# Patient Record
Sex: Female | Born: 1950
Health system: Southern US, Community
[De-identification: ages and names within clinical notes are randomized; demographics above are authoritative.]

## PROBLEM LIST (undated history)

## (undated) DIAGNOSIS — Z9109 Other allergy status, other than to drugs and biological substances: Secondary | ICD-10-CM

## (undated) DIAGNOSIS — H269 Unspecified cataract: Secondary | ICD-10-CM

## (undated) DIAGNOSIS — K5790 Diverticulosis of intestine, part unspecified, without perforation or abscess without bleeding: Secondary | ICD-10-CM

## (undated) DIAGNOSIS — T4145XA Adverse effect of unspecified anesthetic, initial encounter: Secondary | ICD-10-CM

## (undated) DIAGNOSIS — G709 Myoneural disorder, unspecified: Secondary | ICD-10-CM

## (undated) DIAGNOSIS — T8859XA Other complications of anesthesia, initial encounter: Secondary | ICD-10-CM

## (undated) DIAGNOSIS — Z8619 Personal history of other infectious and parasitic diseases: Secondary | ICD-10-CM

## (undated) DIAGNOSIS — J42 Unspecified chronic bronchitis: Secondary | ICD-10-CM

## (undated) DIAGNOSIS — M1990 Unspecified osteoarthritis, unspecified site: Secondary | ICD-10-CM

## (undated) DIAGNOSIS — T7840XA Allergy, unspecified, initial encounter: Secondary | ICD-10-CM

## (undated) DIAGNOSIS — I341 Nonrheumatic mitral (valve) prolapse: Secondary | ICD-10-CM

## (undated) DIAGNOSIS — E039 Hypothyroidism, unspecified: Secondary | ICD-10-CM

## (undated) DIAGNOSIS — K449 Diaphragmatic hernia without obstruction or gangrene: Secondary | ICD-10-CM

## (undated) DIAGNOSIS — E785 Hyperlipidemia, unspecified: Secondary | ICD-10-CM

## (undated) DIAGNOSIS — N189 Chronic kidney disease, unspecified: Secondary | ICD-10-CM

## (undated) DIAGNOSIS — K589 Irritable bowel syndrome without diarrhea: Secondary | ICD-10-CM

## (undated) DIAGNOSIS — K623 Rectal prolapse: Secondary | ICD-10-CM

## (undated) DIAGNOSIS — J189 Pneumonia, unspecified organism: Secondary | ICD-10-CM

## (undated) DIAGNOSIS — G43909 Migraine, unspecified, not intractable, without status migrainosus: Secondary | ICD-10-CM

## (undated) DIAGNOSIS — J45909 Unspecified asthma, uncomplicated: Secondary | ICD-10-CM

## (undated) DIAGNOSIS — K219 Gastro-esophageal reflux disease without esophagitis: Secondary | ICD-10-CM

## (undated) HISTORY — DX: Personal history of other infectious and parasitic diseases: Z86.19

## (undated) HISTORY — PX: BREAST CYST ASPIRATION: SHX578

## (undated) HISTORY — DX: Nonrheumatic mitral (valve) prolapse: I34.1

## (undated) HISTORY — DX: Unspecified cataract: H26.9

## (undated) HISTORY — DX: Diaphragmatic hernia without obstruction or gangrene: K44.9

## (undated) HISTORY — PX: UPPER GASTROINTESTINAL ENDOSCOPY: SHX188

## (undated) HISTORY — DX: Unspecified chronic bronchitis: J42

## (undated) HISTORY — PX: JOINT REPLACEMENT: SHX530

## (undated) HISTORY — DX: Hyperlipidemia, unspecified: E78.5

## (undated) HISTORY — DX: Diverticulosis of intestine, part unspecified, without perforation or abscess without bleeding: K57.90

## (undated) HISTORY — DX: Rectal prolapse: K62.3

## (undated) HISTORY — DX: Unspecified osteoarthritis, unspecified site: M19.90

## (undated) HISTORY — PX: EYE SURGERY: SHX253

## (undated) HISTORY — DX: Migraine, unspecified, not intractable, without status migrainosus: G43.909

## (undated) HISTORY — DX: Other allergy status, other than to drugs and biological substances: Z91.09

## (undated) HISTORY — DX: Hypothyroidism, unspecified: E03.9

## (undated) HISTORY — DX: Irritable bowel syndrome, unspecified: K58.9

## (undated) HISTORY — PX: OTHER SURGICAL HISTORY: SHX169

## (undated) HISTORY — DX: Gastro-esophageal reflux disease without esophagitis: K21.9

## (undated) HISTORY — PX: COLONOSCOPY: SHX174

## (undated) HISTORY — DX: Allergy, unspecified, initial encounter: T78.40XA

---
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
## 1975-01-25 HISTORY — PX: TUBAL LIGATION: SHX77

## 1997-01-24 HISTORY — PX: ABDOMINAL HYSTERECTOMY: SHX81

## 1997-12-20 ENCOUNTER — Emergency Department (HOSPITAL_COMMUNITY): Admission: EM | Admit: 1997-12-20 | Discharge: 1997-12-20 | Payer: Self-pay | Admitting: Emergency Medicine

## 1997-12-29 ENCOUNTER — Inpatient Hospital Stay (HOSPITAL_COMMUNITY): Admission: RE | Admit: 1997-12-29 | Discharge: 1998-01-01 | Payer: Self-pay | Admitting: Obstetrics and Gynecology

## 1998-01-24 HISTORY — PX: REFRACTIVE SURGERY: SHX103

## 1998-04-20 ENCOUNTER — Ambulatory Visit (HOSPITAL_COMMUNITY): Admission: RE | Admit: 1998-04-20 | Discharge: 1998-04-20 | Payer: Self-pay | Admitting: Gastroenterology

## 1999-05-06 ENCOUNTER — Ambulatory Visit (HOSPITAL_COMMUNITY): Admission: RE | Admit: 1999-05-06 | Discharge: 1999-05-06 | Payer: Self-pay | Admitting: Neurology

## 1999-05-06 ENCOUNTER — Encounter: Payer: Self-pay | Admitting: Neurology

## 1999-05-25 ENCOUNTER — Ambulatory Visit (HOSPITAL_COMMUNITY): Admission: RE | Admit: 1999-05-25 | Discharge: 1999-05-25 | Payer: Self-pay | Admitting: Neurology

## 1999-05-29 ENCOUNTER — Emergency Department (HOSPITAL_COMMUNITY): Admission: EM | Admit: 1999-05-29 | Discharge: 1999-05-29 | Payer: Self-pay | Admitting: Emergency Medicine

## 1999-05-31 ENCOUNTER — Encounter: Admission: RE | Admit: 1999-05-31 | Discharge: 1999-08-29 | Payer: Self-pay | Admitting: Anesthesiology

## 1999-10-14 ENCOUNTER — Other Ambulatory Visit: Admission: RE | Admit: 1999-10-14 | Discharge: 1999-10-14 | Payer: Self-pay | Admitting: Family Medicine

## 2001-02-07 ENCOUNTER — Encounter: Payer: Self-pay | Admitting: Gastroenterology

## 2001-05-23 ENCOUNTER — Encounter: Payer: Self-pay | Admitting: Family Medicine

## 2001-05-23 ENCOUNTER — Encounter: Admission: RE | Admit: 2001-05-23 | Discharge: 2001-05-23 | Payer: Self-pay | Admitting: Family Medicine

## 2001-12-03 ENCOUNTER — Other Ambulatory Visit: Admission: RE | Admit: 2001-12-03 | Discharge: 2001-12-03 | Payer: Self-pay | Admitting: Family Medicine

## 2002-05-15 ENCOUNTER — Encounter: Payer: Self-pay | Admitting: Family Medicine

## 2002-05-15 ENCOUNTER — Encounter: Admission: RE | Admit: 2002-05-15 | Discharge: 2002-05-15 | Payer: Self-pay | Admitting: Family Medicine

## 2002-05-16 ENCOUNTER — Encounter (INDEPENDENT_AMBULATORY_CARE_PROVIDER_SITE_OTHER): Payer: Self-pay | Admitting: *Deleted

## 2002-09-12 ENCOUNTER — Emergency Department (HOSPITAL_COMMUNITY): Admission: EM | Admit: 2002-09-12 | Discharge: 2002-09-12 | Payer: Self-pay | Admitting: Emergency Medicine

## 2002-09-12 ENCOUNTER — Encounter: Payer: Self-pay | Admitting: Emergency Medicine

## 2003-01-03 ENCOUNTER — Encounter: Payer: Self-pay | Admitting: Gastroenterology

## 2003-01-03 ENCOUNTER — Encounter: Admission: RE | Admit: 2003-01-03 | Discharge: 2003-01-03 | Payer: Self-pay | Admitting: Gastroenterology

## 2003-01-06 ENCOUNTER — Encounter: Payer: Self-pay | Admitting: Gastroenterology

## 2003-06-02 ENCOUNTER — Encounter: Admission: RE | Admit: 2003-06-02 | Discharge: 2003-06-02 | Payer: Self-pay | Admitting: Family Medicine

## 2003-11-20 ENCOUNTER — Other Ambulatory Visit: Admission: RE | Admit: 2003-11-20 | Discharge: 2003-11-20 | Payer: Self-pay | Admitting: Family Medicine

## 2004-02-13 ENCOUNTER — Ambulatory Visit: Payer: Self-pay | Admitting: Gastroenterology

## 2005-02-09 ENCOUNTER — Other Ambulatory Visit: Admission: RE | Admit: 2005-02-09 | Discharge: 2005-02-09 | Payer: Self-pay | Admitting: Family Medicine

## 2007-03-19 ENCOUNTER — Encounter: Admission: RE | Admit: 2007-03-19 | Discharge: 2007-03-19 | Payer: Self-pay | Admitting: Family Medicine

## 2008-03-10 ENCOUNTER — Encounter: Admission: RE | Admit: 2008-03-10 | Discharge: 2008-03-10 | Payer: Self-pay | Admitting: Internal Medicine

## 2008-08-21 DIAGNOSIS — K589 Irritable bowel syndrome without diarrhea: Secondary | ICD-10-CM | POA: Insufficient documentation

## 2008-08-21 DIAGNOSIS — I059 Rheumatic mitral valve disease, unspecified: Secondary | ICD-10-CM

## 2008-08-21 DIAGNOSIS — Z860100 Personal history of colon polyps, unspecified: Secondary | ICD-10-CM | POA: Insufficient documentation

## 2008-08-21 DIAGNOSIS — E785 Hyperlipidemia, unspecified: Secondary | ICD-10-CM

## 2008-08-21 DIAGNOSIS — I10 Essential (primary) hypertension: Secondary | ICD-10-CM

## 2008-08-21 DIAGNOSIS — K5900 Constipation, unspecified: Secondary | ICD-10-CM | POA: Insufficient documentation

## 2008-08-21 DIAGNOSIS — E039 Hypothyroidism, unspecified: Secondary | ICD-10-CM

## 2008-08-21 DIAGNOSIS — K449 Diaphragmatic hernia without obstruction or gangrene: Secondary | ICD-10-CM | POA: Insufficient documentation

## 2008-08-21 DIAGNOSIS — K5730 Diverticulosis of large intestine without perforation or abscess without bleeding: Secondary | ICD-10-CM | POA: Insufficient documentation

## 2008-08-21 DIAGNOSIS — K648 Other hemorrhoids: Secondary | ICD-10-CM | POA: Insufficient documentation

## 2008-08-28 ENCOUNTER — Ambulatory Visit: Payer: Self-pay | Admitting: Gastroenterology

## 2008-08-28 DIAGNOSIS — M1990 Unspecified osteoarthritis, unspecified site: Secondary | ICD-10-CM | POA: Insufficient documentation

## 2008-08-28 DIAGNOSIS — N390 Urinary tract infection, site not specified: Secondary | ICD-10-CM

## 2008-09-10 ENCOUNTER — Encounter: Payer: Self-pay | Admitting: Gastroenterology

## 2008-09-23 ENCOUNTER — Ambulatory Visit: Payer: Self-pay | Admitting: Gastroenterology

## 2009-03-16 ENCOUNTER — Encounter: Admission: RE | Admit: 2009-03-16 | Discharge: 2009-03-16 | Payer: Self-pay | Admitting: Internal Medicine

## 2010-02-04 ENCOUNTER — Encounter
Admission: RE | Admit: 2010-02-04 | Discharge: 2010-02-04 | Payer: Self-pay | Source: Home / Self Care | Attending: Internal Medicine | Admitting: Internal Medicine

## 2010-02-13 ENCOUNTER — Other Ambulatory Visit: Payer: Self-pay | Admitting: Internal Medicine

## 2010-02-13 DIAGNOSIS — Z1239 Encounter for other screening for malignant neoplasm of breast: Secondary | ICD-10-CM

## 2010-03-24 ENCOUNTER — Ambulatory Visit
Admission: RE | Admit: 2010-03-24 | Discharge: 2010-03-24 | Disposition: A | Discharge status: Home / Self Care | Payer: BC Managed Care – PPO | Source: Ambulatory Visit | Attending: Internal Medicine | Admitting: Internal Medicine

## 2010-03-24 DIAGNOSIS — Z1239 Encounter for other screening for malignant neoplasm of breast: Secondary | ICD-10-CM

## 2010-06-11 NOTE — Op Note (Signed)
[facility]  Patient:    Megan Valentine, Megan Valentine                         MRN: [id number] Proc. Date: 05/25/99 Adm. Date:  [id number] Attending:  Erich Montane                           Operative Report  PATIENT ADDRESS:  [address] [place], [place] [address].  DATE OF BIRTH:   1950-11-10  PROCEDURE:  Spinal tap.  NEUROLOGIST:  Genene Churn. Love, M.D.  CLINICAL INFORMATION:  This patient has a history of paresthesias.  TECHNICAL DESCRIPTION:  The patient was prepped and draped in the left lateral decubitus position using Betadine and 1% Xylocaine.  The L3-L4 interspace was entered without difficulty.  Approximately 15 cc of CSF was obtained and removed and sent for studies including VDRL, protein, glucose, cell count, difficile, IgG, oligoclonal IgG.  The patient tolerated the procedure well. DD:  05/25/99 TD:  05/26/99 Job: [id number]

## 2010-06-11 NOTE — Procedures (Signed)
[facility]  Patient:    Megan Valentine, Megan Valentine                       MRN: [id number] Proc. Date: 05/31/99 Adm. Date:  [id number] Attending:  Thyra Breed CC:         Genene Churn. Love, M.D.                           Procedure Report  PROCEDURE:                    Lumbar epidural blood patch.  DIAGNOSIS:                    Postural puncture headache.  HISTORY:  Megan Valentine is a 60 year old who was sent to [place] by Dr. Sandria Manly for a lumbar epidural blood patch for postural puncture headache.  The patient had an LP done six days ago and five days ago developed a postural headache over the occipital and cortical regions of the head.  This is made worse by sitting up and improved by lying down.  She was sent to the emergency room at [facility] over the weekend and for some reason, anesthesia there was unable to do her blood patch or were not contacted.  She was given Demerol and told to drink some fluids.  She was told not to drink any fluids after midnight last night.  PHYSICAL EXAMINATION:  Blood pressure is 96/48, heart rate is 92, respiratory rate 16, O2 saturation 97%.  Pain level is 8 out of 10.  Temperature is 97.5. Her back shows good healing from her previous LP site.  Neurologic exam is grossly intact.  DESCRIPTION OF PROCEDURE:  After the patient was allowed to hydrate herself with p.o. fluids, she was placed in the sitting position and monitored.  Her back was scrubbed with Betadine x 3.  A skin wheal was raised at the L4-5 interspace with 1% lidocaine.  An 18 gauge Hustead needle was introduced in the lumbar epidural space with loss of resistance to preservative-free normal saline.  There was no CSF nor blood.  Ten milliliters of blood was obtained from the left antecubital fossa after Betadine prep x 3.  Under sterile conditions, this was injected into the epidural space and the needle was flushed with 0.5 cc of preservative-free normal saline.  The needle  was removed intact.  POSTPROCEDURE CONDITION:  Stable.  DISCHARGE INSTRUCTIONS: 1. Resume previous diet and drink 4 L of fluids per day for at least the next    two to three days. 2. Continue on current medications. 3. The patient plans to follow up with Dr. Sandria Manly. DD:  05/31/99 TD:  06/02/99 Job: [id number]

## 2011-03-01 ENCOUNTER — Other Ambulatory Visit: Payer: Self-pay | Admitting: Internal Medicine

## 2011-03-01 DIAGNOSIS — Z1231 Encounter for screening mammogram for malignant neoplasm of breast: Secondary | ICD-10-CM

## 2011-03-24 ENCOUNTER — Encounter: Payer: Self-pay | Admitting: Gastroenterology

## 2011-03-24 ENCOUNTER — Ambulatory Visit (AMBULATORY_SURGERY_CENTER): Payer: 59 | Admitting: *Deleted

## 2011-03-24 VITALS — Ht 64.0 in | Wt 160.5 lb

## 2011-03-24 DIAGNOSIS — Z1211 Encounter for screening for malignant neoplasm of colon: Secondary | ICD-10-CM

## 2011-03-24 MED ORDER — PEG-KCL-NACL-NASULF-NA ASC-C 100 G PO SOLR: ORAL | Status: DC

## 2011-03-28 ENCOUNTER — Ambulatory Visit
Admission: RE | Admit: 2011-03-28 | Discharge: 2011-03-28 | Disposition: A | Discharge status: Home / Self Care | Payer: 59 | Source: Ambulatory Visit | Attending: Internal Medicine | Admitting: Internal Medicine

## 2011-03-28 DIAGNOSIS — Z1231 Encounter for screening mammogram for malignant neoplasm of breast: Secondary | ICD-10-CM

## 2011-04-06 ENCOUNTER — Ambulatory Visit (AMBULATORY_SURGERY_CENTER): Payer: 59 | Admitting: Gastroenterology

## 2011-04-06 ENCOUNTER — Encounter: Payer: Self-pay | Admitting: Gastroenterology

## 2011-04-06 VITALS — BP 126/75 | HR 73 | Temp 98.0°F | Resp 19 | Ht 64.0 in | Wt 160.5 lb

## 2011-04-06 DIAGNOSIS — K5730 Diverticulosis of large intestine without perforation or abscess without bleeding: Secondary | ICD-10-CM

## 2011-04-06 DIAGNOSIS — K6389 Other specified diseases of intestine: Secondary | ICD-10-CM

## 2011-04-06 DIAGNOSIS — Z1211 Encounter for screening for malignant neoplasm of colon: Secondary | ICD-10-CM

## 2011-04-06 MED ORDER — SODIUM CHLORIDE 0.9 % IV SOLN: 500.0000 mL | INTRAVENOUS | Status: DC

## 2011-04-06 NOTE — Progress Notes (Signed)
Patient did not experience any of the following events: a burn prior to discharge; a fall within the facility; wrong site/side/patient/procedure/implant event; or a hospital transfer or hospital admission upon discharge from the facility. (G8907) Patient did not have preoperative order for IV antibiotic SSI prophylaxis. (G8918)  

## 2011-04-06 NOTE — Patient Instructions (Addendum)

## 2011-04-06 NOTE — Op Note (Signed)
[facility] [address] [place], [place]  [address]  COLONOSCOPY PROCEDURE REPORT  PATIENT:  Megan Valentine, Megan Valentine  MR#:  [id number] BIRTHDATE:  May 27, 1950, 61 yrs. old  GENDER:  female ENDOSCOPIST:  Vania Rea. Jarold Motto, MD, [facility] REF. BY: PROCEDURE DATE:  04/06/2011 PROCEDURE:  Average-risk screening colonoscopy G0121 ASA CLASS:  Class I INDICATIONS:  Routine Risk Screening MEDICATIONS:   propofol (Diprivan) 230 mg IV  DESCRIPTION OF PROCEDURE:   After the risks and benefits and of the procedure were explained, informed consent was obtained. Digital rectal exam was performed and revealed no abnormalities. The LB CF-H180AL [id number] endoscope was introduced through the anus and advanced to the cecum, which was identified by both the appendix and ileocecal valve.  The quality of the prep was good, using MoviPrep.  The instrument was then slowly withdrawn as the colon was fully examined. <<PROCEDUREIMAGES>>  FINDINGS:  Melanosis coli was found throughout the colon.  No polyps or cancers were seen.  There were mild diverticular changes in left colon. diverticulosis was found.   Retroflexed views in the rectum revealed no abnormalities.    The scope was then withdrawn from the patient and the procedure completed.  COMPLICATIONS:  None ENDOSCOPIC IMPRESSION: 1) Melanosis throughout the colon 2) No polyps or cancers 3) Diverticulosis,mild,left sided diverticulosis RECOMMENDATIONS: 1) Continue current colorectal screening recommendations for "routine risk" patients with a repeat colonoscopy in 10 years. 2) metamucil or benefiber 3) High fiber diet  REPEAT EXAM:  No  ______________________________ Vania Rea. Jarold Motto, MD, [facility]  CC:  n. eSIGNED:   Vania Rea. Keanna Tugwell at 04/06/2011 09:29 AM  Oran Rein, [id number]

## 2011-04-07 ENCOUNTER — Telehealth: Payer: Self-pay | Admitting: *Deleted

## 2011-04-07 NOTE — Telephone Encounter (Signed)
  Follow up Call-  Call back number 04/06/2011  Post procedure Call Back phone  # [phone]  Permission to leave phone message Yes     Patient questions:  Do you have a fever, pain , or abdominal swelling? no Pain Score  0 *  Have you tolerated food without any problems? yes  Have you been able to return to your normal activities? yes  Do you have any questions about your discharge instructions: Diet   no Medications  no Follow up visit  no  Do you have questions or concerns about your Care? no  Actions: * If pain score is 4 or above: No action needed, pain <4.

## 2011-04-15 ENCOUNTER — Ambulatory Visit (INDEPENDENT_AMBULATORY_CARE_PROVIDER_SITE_OTHER): Payer: 59 | Admitting: Emergency Medicine

## 2011-04-15 VITALS — BP 119/79 | HR 75 | Temp 97.7°F | Resp 18 | Ht 64.5 in | Wt 164.0 lb

## 2011-04-15 DIAGNOSIS — J019 Acute sinusitis, unspecified: Secondary | ICD-10-CM

## 2011-04-15 DIAGNOSIS — J029 Acute pharyngitis, unspecified: Secondary | ICD-10-CM

## 2011-04-15 DIAGNOSIS — J301 Allergic rhinitis due to pollen: Secondary | ICD-10-CM

## 2011-04-15 MED ORDER — FLUTICASONE PROPIONATE 50 MCG/ACT NA SUSP: 2.0000 | Freq: Every day | NASAL | Status: DC

## 2011-04-15 MED ORDER — AMOXICILLIN 875 MG PO TABS: 875.0000 mg | ORAL_TABLET | Freq: Two times a day (BID) | ORAL | Status: AC

## 2011-04-15 NOTE — Progress Notes (Signed)
  Subjective:    Patient ID: Megan Valentine, female    DOB: Sep 04, 1950, 61 y.o.   MRN: [id number]  HPI patient enters with head congestion sore throat. She always has problems in the spring. She's been taking Claritin but continues to have trouble. She has had a bloody drainage from the left side of her nose    Review of Systems noncontributory except as relates to the present illness     Objective:   Physical Exam  Constitutional: She appears well-developed and well-nourished.  HENT:  Head: Normocephalic.  Right Ear: External ear normal.  Left Ear: External ear normal.  Mouth/Throat: No oropharyngeal exudate.       The nose is congested there is mild postnasal drip.  Eyes: Right eye exhibits no discharge. Left eye exhibits no discharge.  Neck: No JVD present. No tracheal deviation present. No thyromegaly present.  Cardiovascular: Normal rate and regular rhythm.   Pulmonary/Chest: No respiratory distress. She has no wheezes. She has no rales. She exhibits no tenderness.  Lymphadenopathy:    She has no cervical adenopathy.          Assessment & Plan:  Her symptoms are primarily that of allergic rhinitis we'll treat with Flonase and amoxicillin she will continue the Claritin.

## 2011-04-26 ENCOUNTER — Encounter: Payer: Self-pay | Admitting: Gastroenterology

## 2011-04-26 ENCOUNTER — Ambulatory Visit (INDEPENDENT_AMBULATORY_CARE_PROVIDER_SITE_OTHER): Payer: 59 | Admitting: Gastroenterology

## 2011-04-26 VITALS — BP 108/70 | HR 68 | Ht 64.0 in | Wt 163.2 lb

## 2011-04-26 DIAGNOSIS — R1310 Dysphagia, unspecified: Secondary | ICD-10-CM

## 2011-04-26 DIAGNOSIS — K219 Gastro-esophageal reflux disease without esophagitis: Secondary | ICD-10-CM

## 2011-04-26 DIAGNOSIS — K5901 Slow transit constipation: Secondary | ICD-10-CM

## 2011-04-26 MED ORDER — PANTOPRAZOLE SODIUM 40 MG PO TBEC: 40.0000 mg | DELAYED_RELEASE_TABLET | Freq: Every day | ORAL | Status: DC

## 2011-04-26 NOTE — Progress Notes (Signed)
This is a very pleasant 61 year old Caucasian female who has chronic IBS and diverticulosis coli with recent negative colonoscopy. She has a long history of acid reflux, and now presents with worsening regurgitation, burning, and solid food dysphagia. It has been several years since her last endoscopy and dilation. Currently she is using over-the-counter ranitidine with no success in terms of decrease in her acid reflux symptomatology. The patient denies constipation as long as she takes daily MiraLax. There is no history of hepatobiliary or general medical problems, food intolerances, anorexia or weight loss. Family history is noncontributory.  Current Medications, Allergies, Past Medical History, Past Surgical History, Family History and Social History were reviewed in [employer] record.  Pertinent Review of Systems Negative... no history of Raynaud's phenomenon,. She does have a past history of thyroid dysfunction and is on Synthroid. Patient's had previous hysterectomy and bilateral oophorectomy.   Physical Exam: Healthy-appearing patient in no distress. Blood pressure 108/70, pulse 68 and regular, and BMI of 28. Cannot appreciate stigmata of chronic liver disease. Chest is clear and she is in a regular rhythm without murmurs gallops or rubs. I cannot appreciate hepatosplenomegaly, abdominal masses or tenderness. Bowel sounds are normal. Mental status is normal.    Assessment and Plan: Her constipation predominant I BS is doing well with fiber and nightly MiraLax. We will continue these medications as listed. We will start her on Protonix 40 mg a day along with standard antireflux maneuvers. I have scheduled an endoscopy with propofol sedation and probable dilation. I reviewed the risk and benefits of these procedures, and she agrees to proceed as planned. She is to take other medications as listed and reviewed per primary care. Encounter Diagnoses  Name Primary?  .  Esophageal reflux Yes  . Dysphagia

## 2011-04-26 NOTE — Patient Instructions (Signed)
Your procedure has been scheduled for 04/27/2011, please follow the seperate instructions.  Take Dexilant samples once a day until gone, then start Protonix once a day a rx has been sent for this.  Follow the Artificial Sweeteners handout given today.

## 2011-04-27 ENCOUNTER — Ambulatory Visit (AMBULATORY_SURGERY_CENTER): Payer: 59 | Admitting: Gastroenterology

## 2011-04-27 ENCOUNTER — Encounter: Payer: Self-pay | Admitting: Gastroenterology

## 2011-04-27 VITALS — BP 118/72 | HR 80 | Temp 97.3°F | Resp 21 | Ht 64.0 in | Wt 163.0 lb

## 2011-04-27 DIAGNOSIS — K222 Esophageal obstruction: Secondary | ICD-10-CM

## 2011-04-27 DIAGNOSIS — K449 Diaphragmatic hernia without obstruction or gangrene: Secondary | ICD-10-CM

## 2011-04-27 DIAGNOSIS — K219 Gastro-esophageal reflux disease without esophagitis: Secondary | ICD-10-CM

## 2011-04-27 DIAGNOSIS — R1310 Dysphagia, unspecified: Secondary | ICD-10-CM

## 2011-04-27 MED ORDER — SODIUM CHLORIDE 0.9 % IV SOLN: 500.0000 mL | INTRAVENOUS | Status: DC

## 2011-04-27 NOTE — Op Note (Signed)
[facility] [address] [place], [place]  [address]  ENDOSCOPY PROCEDURE REPORT  PATIENT:  Megan, Valentine  MR#:  [id number] BIRTHDATE:  1950-06-28, 61 yrs. old  GENDER:  female  ENDOSCOPIST:  Vania Rea. Jarold Motto, MD, [facility] Referred by:  PROCEDURE DATE:  04/27/2011 PROCEDURE:  EGD, diagnostic 43235, Maloney Dilation of Esophagus ASA CLASS:  Class II INDICATIONS:  GERD, dysphagia  MEDICATIONS:   propofol (Diprivan) 100 mg IV TOPICAL ANESTHETIC:  Cetacaine Spray  DESCRIPTION OF PROCEDURE:   After the risks and benefits of the procedure were explained, informed consent was obtained.  The LB GIF-H180 [id number] endoscope was introduced through the mouth and advanced to the second portion of the duodenum.  The instrument was slowly withdrawn as the mucosa was fully examined. <<PROCEDUREIMAGES>>  A hiatal hernia was found. 4 CM HIATIAL HERNIA NOTED.DILATED #34F MALONEY DILATOR.NOHEME OR PAIN NOTED.  The esophagus and gastroesophageal junction were completely normal in appearance. The stomach was entered and closely examined. The antrum, angularis, and lesser curvature were well visualized, including a retroflexed view of the cardia and fundus. The stomach wall was normally distensable. The scope passed easily through the pylorus into the duodenum.  The duodenal bulb was normal in appearance, as was the postbulbar duodenum.    Retroflexed views revealed a hiatal hernia.    The scope was then withdrawn from the patient and the procedure completed.  COMPLICATIONS:  None  ENDOSCOPIC IMPRESSION: 1) Hiatal hernia 2) Normal esophagus 3) Normal stomach 4) Normal duodenum 5) A hiatal hernia CHRONIC GERD AND PROBABLE OCCULT STRICTURE DILATED. RECOMMENDATIONS: 1) Clear liquids until, then soft foods rest iof day. Resume prior diet tomorrow. 2) continue current medications  ______________________________ Vania Rea. Jarold Motto, MD, [facility]  CC:  n. eSIGNED:   Vania Rea. [name]  at 04/27/2011 03:45 PM  Oran Rein, [id number]

## 2011-04-27 NOTE — Progress Notes (Signed)
Patient did not experience any of the following events: a burn prior to discharge; a fall within the facility; wrong site/side/patient/procedure/implant event; or a hospital transfer or hospital admission upon discharge from the facility. (G8907) Patient did not have preoperative order for IV antibiotic SSI prophylaxis. (G8918)  

## 2011-04-27 NOTE — Patient Instructions (Signed)

## 2011-04-28 ENCOUNTER — Telehealth: Payer: Self-pay | Admitting: *Deleted

## 2011-04-28 NOTE — Telephone Encounter (Signed)
  Follow up Call-  Call back number 04/27/2011 04/06/2011  Post procedure Call Back phone  # [phone]  Permission to leave phone message Yes Yes     Patient questions:  Left message to call if necessary.

## 2011-06-06 ENCOUNTER — Other Ambulatory Visit: Payer: Self-pay | Admitting: Orthopaedic Surgery

## 2011-06-06 DIAGNOSIS — K769 Liver disease, unspecified: Secondary | ICD-10-CM

## 2011-06-09 ENCOUNTER — Other Ambulatory Visit: Payer: 59

## 2011-07-02 ENCOUNTER — Ambulatory Visit (INDEPENDENT_AMBULATORY_CARE_PROVIDER_SITE_OTHER): Payer: 59 | Admitting: Family Medicine

## 2011-07-02 VITALS — BP 115/74 | HR 76 | Temp 97.9°F | Resp 16 | Ht 64.5 in | Wt 163.8 lb

## 2011-07-02 DIAGNOSIS — M6580 Other synovitis and tenosynovitis, unspecified site: Secondary | ICD-10-CM

## 2011-07-02 DIAGNOSIS — M76892 Other specified enthesopathies of left lower limb, excluding foot: Secondary | ICD-10-CM

## 2011-07-02 NOTE — Progress Notes (Signed)
Subjective: 61 year old lady has had problems since February pain in the medial aspect of the left knee. She did have a way by her orthopedist. She has had an MRI of her back. She has had an injection of the past anserine bursal area. She is taken long courses of meloxicam. None of these things have helped her at all. Sometimes it feels like it catches and wants to give way when she goes a certain position because it hurts. Knows of no specific injury.  Objective: Extreme tenderness over the medial aspect of the left knee along the medial hamstring just distal to and posterior to the past anserine bursa area. Otherwise he seems fairly stable.  Assessment: Tendinitis left knee  Plan: Will refer her to the sports medicine office at [facility] to see if Dr. Jens Som can assess her there.

## 2011-07-02 NOTE — Patient Instructions (Signed)
We will refer you to the sports medicine clinic at [facility] which is located adjacent to the [facility] staff pharmacy. Dr. Electa Sniff runs the clinic, but several doctors work there. We will try to treat him with one of them. If you do not hear from our of referral staff within the next 5 or 6 days call back and ask for referrals to find out about this.  Continue taking Aleve 2 tablets twice daily with food until you see them.

## 2011-07-06 ENCOUNTER — Ambulatory Visit (INDEPENDENT_AMBULATORY_CARE_PROVIDER_SITE_OTHER): Payer: 59 | Admitting: Family Medicine

## 2011-07-06 ENCOUNTER — Encounter: Payer: Self-pay | Admitting: Family Medicine

## 2011-07-06 VITALS — BP 120/79 | HR 73 | Temp 98.3°F | Ht 65.0 in | Wt 162.0 lb

## 2011-07-06 DIAGNOSIS — M25562 Pain in left knee: Secondary | ICD-10-CM

## 2011-07-06 DIAGNOSIS — M25569 Pain in unspecified knee: Secondary | ICD-10-CM

## 2011-07-06 NOTE — Patient Instructions (Addendum)
Your tenderness currently is at the medial joint line (where arthritis, degenerative meniscal tear present), and at pes bursa (insertional hamstring tendinitis). Ice your knee 15 minutes at a time 3-4 times a day. Continue aleve 2 tabs twice a day. Start straight leg raises, leg raises with outturned foot, knee extensions. For hamstring start leg curls, swings, and half-lunges 3 sets of 6 each of these to start with - work way up to 3 sets of 10 once a day. Injection into the pes bursa area is a consideration. Formal physical therapy, nitro patches are also considerations.

## 2011-07-07 ENCOUNTER — Encounter: Payer: Self-pay | Admitting: Family Medicine

## 2011-07-07 DIAGNOSIS — M25562 Pain in left knee: Secondary | ICD-10-CM | POA: Insufficient documentation

## 2011-07-07 NOTE — Progress Notes (Signed)
Subjective:    Patient ID: Megan Valentine, female    DOB: [birth year], 61 y.o.   MRN: [id number]  PCP: Dr. Burton Apley  HPI 61 yo F here for left knee pain.  Patient reports remote history of a fall 5 years ago on knee - thinks this may be related to current pain. Has known history of arthritis. Previously in 2010 and 2013 had intraarticular cortisone injections - one in 2010 helped, most recent one in May did not help very much. Since February pain has been fairly severe with difficulty bearing weight. Unable to do PT due to her work schedule. Has tried meloxicam, aleve, advil without much benefit. Some giving way and locking of her knee.  Past Medical History  Diagnosis Date  . Environmental allergies   . Arthritis   . GERD (gastroesophageal reflux disease)   . Hiatal hernia   . Mitral valve prolapse   . Hyperlipidemia   . Hypothyroidism   . Rectal prolapse   . Diverticulosis     Current Outpatient Prescriptions on File Prior to Visit  Medication Sig Dispense Refill  . aspirin 81 MG tablet Take 81 mg by mouth daily.      . cholecalciferol (VITAMIN D) 1000 UNITS tablet Take 1,000 Units by mouth daily.      . fluticasone (FLONASE) 50 MCG/ACT nasal spray Place 2 sprays into the nose daily.      Marland Kitchen GLUCOSAMINE HCL PO Take by mouth. Takes 2000 mg daily      . hydrochlorothiazide (HYDRODIURIL) 25 MG tablet Take 25 mg by mouth 2 (two) times daily.       Marland Kitchen KLOR-CON M20 20 MEQ tablet 20 mEq daily.       Marland Kitchen levothyroxine (SYNTHROID, LEVOTHROID) 88 MCG tablet Take 88 mcg by mouth daily.       Marland Kitchen LIDODERM 5 % Place 1 patch onto the skin daily.       Marland Kitchen loratadine (CLARITIN) 10 MG tablet Take 10 mg by mouth daily.      . Magnesium Citrate 100 MG TABS Take 100 mg by mouth. 2 tablets twice daily      . NON FORMULARY Mega Red 1 tablet daily      . pantoprazole (PROTONIX) 40 MG tablet Take 1 tablet (40 mg total) by mouth daily.  30 tablet  6  . Polyethylene Glycol 3350 (MIRALAX PO) Take by  mouth daily.      Marland Kitchen PREMARIN 0.45 MG tablet Take 0.45 mg by mouth daily. Takes 1/2 tablet daily        Past Surgical History  Procedure Date  . Abdominal hysterectomy 1999  . Tubal ligation 1977  . Appendectomy 1962  . Refractive surgery 2000    both eyes    Allergies  Allergen Reactions  . Tetracycline Hives and Itching    History   Social History  . Marital Status: Married    Spouse Name: N/A    Number of Children: N/A  . Years of Education: N/A   Occupational History  . Not on file.   Social History Main Topics  . Smoking status: Former Smoker    Types: Cigarettes    Quit date: 03/23/1976  . Smokeless tobacco: Never Used  . Alcohol Use: No  . Drug Use: No  . Sexually Active: Not on file   Other Topics Concern  . Not on file   Social History Narrative  . No narrative on file    Family History  Problem Relation Age  of Onset  . Stomach cancer Maternal Grandmother   . Colon cancer Paternal Grandfather   . Hyperlipidemia Mother   . Diabetes Mother   . Hypertension Mother   . Diabetes Father   . Hyperlipidemia Father   . Heart attack Neg Hx     BP 120/79  Pulse 73  Temp 98.3 F (36.8 C) (Oral)  Ht 5\' 5"  (1.651 m)  Wt 162 lb (73.483 kg)  BMI 26.96 kg/m2  Review of Systems See HPI above.    Objective:   Physical Exam Gen: NAD  L knee: No gross deformity, ecchymoses, effusion. TTP greatest at pes bursa and medial joint line.  No lateral joint line, post patellar or other TTP. FROM. Negative ant/post drawers. Negative valgus/varus testing. Negative lachmanns. Negative mcmurrays, apleys, patellar apprehension. NV intact distally.  R knee: FROM without pain or instability.    Assessment & Plan:  1. Left knee pain - Likely due to DJD (less likely degen meniscal tear) and pes bursitis.  Given pes injection today - would not repeat intraarticular injection yet as she just had this a month ago.  Shown a home rehab program for hamstrings and quad.   Consider formal PT, nitro patches if not improving as expected.  Icing, relative rest.  After informed written consent, patient was seated on exam table. Left knee overlying pes bursa was prepped with alcohol swab and injected with 2:1 marcaine: depomedrol. Patient tolerated the procedure well without immediate complications.

## 2011-07-12 NOTE — Assessment & Plan Note (Signed)
Likely due to DJD (less likely degen meniscal tear) and pes bursitis.  Given pes injection today - would not repeat intraarticular injection yet as she just had this a month ago.  Shown a home rehab program for hamstrings and quad.  Consider formal PT, nitro patches if not improving as expected.  Icing, relative rest.  After informed written consent, patient was seated on exam table. Left knee overlying pes bursa was prepped with alcohol swab and injected with 2:1 marcaine: depomedrol. Patient tolerated the procedure well without immediate complications.

## 2011-08-01 ENCOUNTER — Ambulatory Visit
Admission: RE | Admit: 2011-08-01 | Discharge: 2011-08-01 | Disposition: A | Discharge status: Home / Self Care | Payer: 59 | Source: Ambulatory Visit | Attending: Orthopaedic Surgery | Admitting: Orthopaedic Surgery

## 2011-08-01 DIAGNOSIS — K769 Liver disease, unspecified: Secondary | ICD-10-CM

## 2011-08-09 ENCOUNTER — Other Ambulatory Visit: Payer: 59 | Admitting: Sports Medicine

## 2011-08-12 ENCOUNTER — Ambulatory Visit (INDEPENDENT_AMBULATORY_CARE_PROVIDER_SITE_OTHER): Payer: 59 | Admitting: Emergency Medicine

## 2011-08-12 VITALS — BP 134/78 | HR 64 | Temp 97.7°F | Resp 18 | Ht 64.5 in | Wt 163.0 lb

## 2011-08-12 DIAGNOSIS — B079 Viral wart, unspecified: Secondary | ICD-10-CM

## 2011-08-12 DIAGNOSIS — J018 Other acute sinusitis: Secondary | ICD-10-CM

## 2011-08-12 MED ORDER — GUAIFENESIN-DM 100-10 MG/5ML PO SYRP: 10.0000 mL | ORAL_SOLUTION | Freq: Four times a day (QID) | ORAL | Status: AC | PRN

## 2011-08-12 MED ORDER — AMOXICILLIN 875 MG PO TABS: 875.0000 mg | ORAL_TABLET | Freq: Two times a day (BID) | ORAL | Status: AC

## 2011-08-12 NOTE — Progress Notes (Signed)
  Subjective:    Patient ID: Megan Valentine, female    DOB: 12-03-1950, 62 y.o.   MRN: [id number]  Cough This is a new problem. The current episode started in the past 7 days. The problem has been gradually worsening. The problem occurs constantly. The cough is non-productive. Associated symptoms include ear congestion, a fever, nasal congestion, postnasal drip, rhinorrhea and a sore throat. Pertinent negatives include no chest pain, chills, ear pain, headaches, heartburn, hemoptysis, myalgias, rash, shortness of breath, sweats, weight loss or wheezing. Nothing aggravates the symptoms. The treatment provided no relief. There is no history of asthma, bronchiectasis, bronchitis, COPD, emphysema, environmental allergies or pneumonia.      Review of Systems  Constitutional: Positive for fever. Negative for chills and weight loss.  HENT: Positive for sore throat, rhinorrhea and postnasal drip. Negative for ear pain.   Eyes: Negative.   Respiratory: Positive for cough. Negative for hemoptysis, shortness of breath and wheezing.   Cardiovascular: Negative for chest pain.  Gastrointestinal: Negative.  Negative for heartburn.  Genitourinary: Negative.   Musculoskeletal: Negative.  Negative for myalgias.  Skin: Negative for rash.  Neurological: Negative for headaches.  Hematological: Negative for environmental allergies.       Objective:   Physical Exam  Constitutional: She is oriented to person, place, and time. She appears well-developed and well-nourished.  HENT:  Head: Normocephalic and atraumatic.  Right Ear: External ear normal.  Left Ear: External ear normal.  Mouth/Throat: Oropharynx is clear and moist.  Eyes: Conjunctivae are normal. Pupils are equal, round, and reactive to light.  Neck: Normal range of motion. Neck supple.  Cardiovascular: Normal rate and regular rhythm.   Pulmonary/Chest: Effort normal and breath sounds normal.  Abdominal: Soft.  Musculoskeletal: Normal range of  motion.  Neurological: She is alert and oriented to person, place, and time.  Skin: Skin is warm and dry.          Assessment & Plan:  Destruction of two warts on right second toe with liquid nitrogen  Sinusitis and bronchitis Amoxicillin mucinex d Robitussin dm

## 2011-08-17 ENCOUNTER — Ambulatory Visit: Payer: 59 | Admitting: Family Medicine

## 2011-09-03 ENCOUNTER — Ambulatory Visit (INDEPENDENT_AMBULATORY_CARE_PROVIDER_SITE_OTHER): Payer: 59 | Admitting: Physician Assistant

## 2011-09-03 VITALS — BP 101/69 | HR 78 | Temp 98.0°F | Resp 16 | Ht 64.58 in | Wt 165.6 lb

## 2011-09-03 DIAGNOSIS — J329 Chronic sinusitis, unspecified: Secondary | ICD-10-CM

## 2011-09-03 DIAGNOSIS — J309 Allergic rhinitis, unspecified: Secondary | ICD-10-CM

## 2011-09-03 MED ORDER — AMOXICILLIN-POT CLAVULANATE 875-125 MG PO TABS: 1.0000 | ORAL_TABLET | Freq: Two times a day (BID) | ORAL | Status: AC

## 2011-09-03 MED ORDER — IPRATROPIUM BROMIDE 0.03 % NA SOLN: 2.0000 | Freq: Two times a day (BID) | NASAL | Status: DC

## 2011-09-03 MED ORDER — PREDNISONE 20 MG PO TABS: ORAL_TABLET | ORAL | Status: AC

## 2011-09-03 NOTE — Progress Notes (Signed)
  Subjective:    Patient ID: Megan Valentine, female    DOB: 1950-08-25, 61 y.o.   MRN: [id number]  HPI 61 year old female presents with 1 week history of worsening sinus pain/pressure, clear rhinorrhea, right sided otalgia, and slight cough and sore throat.  She was treated for a sinus infection with amoxicillin on 7/19 which helped but then her symptoms but then they returned at the beginning of this week. No fevers, chills, nausea, vomiting, headache ,or dizziness.      Review of Systems  All other systems reviewed and are negative.       Objective:   Physical Exam  Constitutional: She is oriented to person, place, and time. She appears well-developed and well-nourished.  HENT:  Head: Normocephalic and atraumatic.  Right Ear: Hearing, external ear and ear canal normal. A middle ear effusion is present.  Left Ear: Hearing, tympanic membrane, external ear and ear canal normal.  Nose: Right sinus exhibits maxillary sinus tenderness. Right sinus exhibits no frontal sinus tenderness. Left sinus exhibits maxillary sinus tenderness. Left sinus exhibits no frontal sinus tenderness.  Mouth/Throat: Uvula is midline, oropharynx is clear and moist and mucous membranes are normal.  Eyes: Conjunctivae are normal.  Neck: Normal range of motion.  Cardiovascular: Normal rate, regular rhythm and normal heart sounds.   Pulmonary/Chest: Breath sounds normal.  Neurological: She is alert and oriented to person, place, and time.  Psychiatric: She has a normal mood and affect. Her behavior is normal. Judgment and thought content normal.          Assessment & Plan:   1. Sinusitis  Continue Flonase and Claritin daily Add Atrovent NS bid and prednisone taper Follow up if no improvement in symptoms.  predniSONE (DELTASONE) 20 MG tablet, amoxicillin-clavulanate (AUGMENTIN) 875-125 MG per tablet  2. Allergic rhinitis  ipratropium (ATROVENT) 0.03 % nasal spray

## 2011-09-15 ENCOUNTER — Ambulatory Visit (INDEPENDENT_AMBULATORY_CARE_PROVIDER_SITE_OTHER): Payer: 59 | Admitting: Endocrinology

## 2011-09-15 ENCOUNTER — Encounter: Payer: Self-pay | Admitting: Endocrinology

## 2011-09-15 VITALS — BP 110/72 | HR 71 | Temp 97.9°F | Ht 64.5 in | Wt 169.0 lb

## 2011-09-15 DIAGNOSIS — E876 Hypokalemia: Secondary | ICD-10-CM

## 2011-09-15 DIAGNOSIS — M129 Arthropathy, unspecified: Secondary | ICD-10-CM

## 2011-09-15 DIAGNOSIS — R635 Abnormal weight gain: Secondary | ICD-10-CM

## 2011-09-15 DIAGNOSIS — R209 Unspecified disturbances of skin sensation: Secondary | ICD-10-CM

## 2011-09-15 DIAGNOSIS — L659 Nonscarring hair loss, unspecified: Secondary | ICD-10-CM | POA: Insufficient documentation

## 2011-09-15 DIAGNOSIS — E039 Hypothyroidism, unspecified: Secondary | ICD-10-CM

## 2011-09-15 MED ORDER — DEXAMETHASONE 1 MG PO TABS: 1.0000 mg | ORAL_TABLET | Freq: Two times a day (BID) | ORAL | Status: DC

## 2011-09-15 NOTE — Progress Notes (Signed)
Subjective:    Patient ID: Megan Valentine, female    DOB: 06-28-1950, 61 y.o.   MRN: [id number]  HPI Pt says she was dx'ed with hypothyroidism approx 25 years ago.  She has been on synthroid since then.  She says despite normal blood tests on synthroid, she does not feel well.  She has 1 year of slight fluid retention at the limbs and abdomen, and assoc fatigue. Past Medical History  Diagnosis Date  . Environmental allergies   . Arthritis   . GERD (gastroesophageal reflux disease)   . Hiatal hernia   . Mitral valve prolapse   . Hyperlipidemia   . Hypothyroidism   . Rectal prolapse   . Diverticulosis   . History of chicken pox   . Allergy   . Migraines   . IBS (irritable bowel syndrome)   . Chronic bronchitis     Past Surgical History  Procedure Date  . Abdominal hysterectomy 1999  . Tubal ligation 1977  . Appendectomy 1962  . Refractive surgery 2000    both eyes    History   Social History  . Marital Status: Married    Spouse Name: N/A    Number of Children: N/A  . Years of Education: 12   Occupational History  . 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 Smoking status: Former Smoker    Types: Cigarettes    Quit date: 03/23/1976  . Smokeless tobacco: Never Used  . Alcohol Use: No  . Drug Use: No  . Sexually Active: Not on file   Other Topics Concern  . Not on file   Social History Narrative   Caffeine Use-yesRegular exercise-no    Current Outpatient Prescriptions on File Prior to Visit  Medication Sig Dispense Refill  . aspirin 81 MG tablet Take 81 mg by mouth daily.      . cholecalciferol (VITAMIN D) 1000 UNITS tablet Take 1,000 Units by mouth daily.      . fluticasone (FLONASE) 50 MCG/ACT nasal spray Place 2 sprays into the nose daily.      . hydrochlorothiazide (HYDRODIURIL) 25 MG tablet Take 25 mg by mouth 2 (two) times daily.       Marland Kitchen ipratropium (ATROVENT) 0.03 % nasal spray Place 2 sprays into the nose 2 (two) times daily.  30 mL  5    . KLOR-CON M20 20 MEQ tablet 20 mEq daily.       Marland Kitchen levothyroxine (SYNTHROID, LEVOTHROID) 88 MCG tablet Take 88 mcg by mouth daily.       Marland Kitchen loratadine (CLARITIN) 10 MG tablet Take 10 mg by mouth daily.      . NON FORMULARY Mega Red 1 tablet daily      . pantoprazole (PROTONIX) 40 MG tablet Take 1 tablet (40 mg total) by mouth daily.  30 tablet  6  . Polyethylene Glycol 3350 (MIRALAX PO) Take by mouth daily.      Marland Kitchen PREMARIN 0.45 MG tablet Take 0.45 mg by mouth daily. Takes 1/2 tablet daily        Allergies  Allergen Reactions  . Tetracycline Hives and Itching    Family History  Problem Relation Age of Onset  . Stomach cancer Maternal Grandmother   . Hypertension Maternal Grandmother   . Diabetes Maternal Grandmother   . Stroke Maternal Grandmother   . Cancer Maternal Grandmother     Stomach cancer  . Colon cancer Paternal Grandfather   . Heart attack Paternal Grandfather   .  Hyperlipidemia Mother   . Diabetes Mother   . Hypertension Mother   . Alzheimer's disease Mother   . Diabetes Father   . Hyperlipidemia Father   . Heart disease Father   . Heart attack Father   . Stroke Paternal Grandmother     BP 110/72  Pulse 71  Temp 97.9 F (36.6 C) (Oral)  Ht 5' 4.5" (1.638 m)  Wt 169 lb (76.658 kg)  BMI 28.56 kg/m2  SpO2 98%   Review of Systems She reports constipation, dry hair, easy bruising, acral numbness, hair loss, rhinorrhea, weight gain, arthralgias, muscle cramps, hoarseness, and difficulty with constipation.  She denies depression, sob, numbness, blurry vision, and syncope.      Objective:   Physical Exam VS: see vs page GEN: no distress HEAD: head: no deformity eyes: no periorbital swelling, no proptosis external nose and ears are normal mouth: no lesion seen NECK: supple, thyroid is not enlarged CHEST WALL: no deformity LUNGS:  Clear to auscultation CV: reg rate and rhythm, no murmur MUSCULOSKELETAL: muscle bulk and strength are grossly normal.  no  obvious joint swelling.  gait is normal and steady EXTEMITIES: no deformity.  no ulcer on the feet.  feet are of normal color and temp.  bilat varicosities.  Trace bilat leg edema PULSES: dorsalis pedis intact bilat.   NEURO:  cn 2-12 grossly intact.   readily moves all 4's.  sensation is intact to touch on the feet.  No tremor. SKIN:  Normal texture and temperature.  No rash or suspicious lesion is visible.   NODES:  None palpable at the neck.  PSYCH: alert, oriented x3.  Does not appear anxious nor depressed.  Lab Results  Component Value Date   TSH 1.33 09/16/2011      Assessment & Plan:  Weight gain, uncertain etiology Hypothyroidism, on replacement Fluid retention, mild, uncertain etiology Arthralgias, uncertain etiology.

## 2011-09-15 NOTE — Patient Instructions (Addendum)
blood tests are being requested for you today.  You will receive a letter with results. you should do a "dexamethasone suppression test."  for this, you would take dexamethasone 1 mg at 10 pm, then come in for a "cortisol" blood test the next morning before 9 am.  you do not need to be fasting for this test.

## 2011-09-16 ENCOUNTER — Other Ambulatory Visit (INDEPENDENT_AMBULATORY_CARE_PROVIDER_SITE_OTHER): Payer: 59

## 2011-09-16 DIAGNOSIS — E039 Hypothyroidism, unspecified: Secondary | ICD-10-CM

## 2011-09-16 DIAGNOSIS — E876 Hypokalemia: Secondary | ICD-10-CM

## 2011-09-16 DIAGNOSIS — R209 Unspecified disturbances of skin sensation: Secondary | ICD-10-CM

## 2011-09-16 DIAGNOSIS — M129 Arthropathy, unspecified: Secondary | ICD-10-CM

## 2011-09-16 DIAGNOSIS — L659 Nonscarring hair loss, unspecified: Secondary | ICD-10-CM

## 2011-09-16 LAB — T4, FREE: Free T4: 1.42 ng/dL (ref 0.60–1.60)

## 2011-09-16 LAB — BASIC METABOLIC PANEL
BUN: 22 mg/dL (ref 6–23)
Calcium: 9.3 mg/dL (ref 8.4–10.5)
GFR: 77.38 mL/min (ref >60.00)
Glucose, Bld: 102 mg/dL — ABNORMAL HIGH (ref 70–99)
Potassium: 3.3 mEq/L — ABNORMAL LOW (ref 3.5–5.1)

## 2011-09-16 LAB — TSH: TSH: 1.33 u[IU]/mL (ref 0.35–5.50)

## 2011-09-16 LAB — VITAMIN B12: Vitamin B-12: 839 pg/mL (ref 211–911)

## 2011-09-19 LAB — THYROID PEROXIDASE ANTIBODY: Thyroperoxidase Ab SerPl-aCnc: 158 [IU]/mL — ABNORMAL HIGH

## 2011-09-22 ENCOUNTER — Other Ambulatory Visit: Payer: Self-pay | Admitting: *Deleted

## 2011-09-22 ENCOUNTER — Other Ambulatory Visit: Payer: Self-pay | Admitting: Endocrinology

## 2011-09-22 DIAGNOSIS — R635 Abnormal weight gain: Secondary | ICD-10-CM

## 2011-09-22 MED ORDER — DEXAMETHASONE 1 MG PO TABS: 1.0000 mg | ORAL_TABLET | Freq: Two times a day (BID) | ORAL | Status: DC

## 2011-09-23 ENCOUNTER — Encounter: Payer: Self-pay | Admitting: Endocrinology

## 2011-09-23 ENCOUNTER — Other Ambulatory Visit (INDEPENDENT_AMBULATORY_CARE_PROVIDER_SITE_OTHER): Payer: 59

## 2011-09-23 DIAGNOSIS — R635 Abnormal weight gain: Secondary | ICD-10-CM

## 2011-09-24 ENCOUNTER — Ambulatory Visit (INDEPENDENT_AMBULATORY_CARE_PROVIDER_SITE_OTHER): Payer: 59 | Admitting: Emergency Medicine

## 2011-09-24 VITALS — BP 121/82 | HR 76 | Temp 98.5°F | Resp 18 | Ht 64.5 in | Wt 166.0 lb

## 2011-09-24 DIAGNOSIS — J018 Other acute sinusitis: Secondary | ICD-10-CM

## 2011-09-24 MED ORDER — GUAIFENESIN ER 600 MG PO TB12: 600.0000 mg | ORAL_TABLET | Freq: Two times a day (BID) | ORAL | Status: DC

## 2011-09-24 MED ORDER — CEFPROZIL 500 MG PO TABS: 500.0000 mg | ORAL_TABLET | Freq: Two times a day (BID) | ORAL | Status: AC

## 2011-09-24 MED ORDER — MOMETASONE FUROATE 50 MCG/ACT NA SUSP: 2.0000 | Freq: Every day | NASAL | Status: DC

## 2011-09-24 NOTE — Progress Notes (Signed)
Date:  09/24/2011   Name:  Cadie Sorci   DOB:  12/12/1950   MRN:  [id number] Gender: female Age: 61 y.o.  PCP:  Lorenda Peck, MD    Chief Complaint: Sinusitis   History of Present Illness:  Vernis Cabacungan is a 61 y.o. pleasant patient who presents with the following:  Nasal congestion, discharge, pain and pressure in sinuses, and post nasal drainage.  Purulent in color.  Has nonproductive cough, worse at night.  Thinks she has experienced a fever but has not taken her temperature.  Pain and pressure in right ear.  Seen 8/10 for same and improved but has relapsed.  Treated then with augmentin.  Patient Active Problem List  Diagnosis  . HYPOTHYROIDISM  . HYPERLIPIDEMIA  . HYPERTENSIVE CARDIOVASCULAR DISEASE  . MITRAL VALVE PROLAPSE  . INTERNAL HEMORRHOIDS  . HIATAL HERNIA WITH REFLUX  . DIVERTICULOSIS, COLON  . CONSTIPATION  . IRRITABLE BOWEL SYNDROME  . UTI  . ARTHRITIS  . COLONIC POLYPS, ADENOMATOUS, HX OF  . Melanosis coli  . Special screening for malignant neoplasms, colon  . Left knee pain  . Weight gain  . Disturbance of skin sensation  . Hair loss  . Hypokalemia    Past Medical History  Diagnosis Date  . Environmental allergies   . Arthritis   . GERD (gastroesophageal reflux disease)   . Hiatal hernia   . Mitral valve prolapse   . Hyperlipidemia   . Hypothyroidism   . Rectal prolapse   . Diverticulosis   . History of chicken pox   . Allergy   . Migraines   . IBS (irritable bowel syndrome)   . Chronic bronchitis     Past Surgical History  Procedure Date  . Abdominal hysterectomy 1999  . Tubal ligation 1977  . Appendectomy 1962  . Refractive surgery 2000    both eyes    History  Substance Use Topics  . Smoking status: Former Smoker    Types: Cigarettes    Quit date: 03/23/1976  . Smokeless tobacco: Never Used  . Alcohol Use: No    Family History  Problem Relation Age of Onset  . Stomach cancer Maternal Grandmother   .  Hypertension Maternal Grandmother   . Diabetes Maternal Grandmother   . Stroke Maternal Grandmother   . Cancer Maternal Grandmother     Stomach cancer  . Colon cancer Paternal Grandfather   . Heart attack Paternal Grandfather   . Hyperlipidemia Mother   . Diabetes Mother   . Hypertension Mother   . Alzheimer's disease Mother   . Diabetes Father   . Hyperlipidemia Father   . Heart disease Father   . Heart attack Father   . Stroke Paternal Grandmother     Allergies  Allergen Reactions  . Tetracycline Hives and Itching    Medication list has been reviewed and updated.  Current Outpatient Prescriptions on File Prior to Visit  Medication Sig Dispense Refill  . aspirin 81 MG tablet Take 81 mg by mouth daily.      Marland Kitchen b complex vitamins tablet Take 1 tablet by mouth daily.      . cholecalciferol (VITAMIN D) 1000 UNITS tablet Take 1,000 Units by mouth daily.      . fluticasone (FLONASE) 50 MCG/ACT nasal spray Place 2 sprays into the nose daily.      . hydrochlorothiazide (HYDRODIURIL) 25 MG tablet Take 25 mg by mouth 2 (two) times daily.       Marland Kitchen ipratropium (ATROVENT) 0.03 %  nasal spray Place 2 sprays into the nose 2 (two) times daily.  30 mL  5  . KLOR-CON M20 20 MEQ tablet 20 mEq daily.       Marland Kitchen levothyroxine (SYNTHROID, LEVOTHROID) 88 MCG tablet Take 88 mcg by mouth daily.       Marland Kitchen loratadine (CLARITIN) 10 MG tablet Take 10 mg by mouth daily.      . Multiple Vitamins-Minerals (WOMENS HAIR, SKIN & NAILS) TABS Take 1 tablet by mouth daily.      . Naproxen Sodium 220 MG CAPS Take 2 capsules by mouth every morning.      . pantoprazole (PROTONIX) 40 MG tablet Take 1 tablet (40 mg total) by mouth daily.  30 tablet  6  . Polyethylene Glycol 3350 (MIRALAX PO) Take by mouth daily.      Marland Kitchen PREMARIN 0.45 MG tablet Take 0.45 mg by mouth daily. Takes 1/2 tablet daily      . NON FORMULARY Mega Red 1 tablet daily        Review of Systems:  As per HPI, otherwise negative.    Physical  Examination: Filed Vitals:   09/24/11 0754  BP: 121/82  Pulse: 76  Temp: 98.5 F (36.9 C)  Resp: 18   Filed Vitals:   09/24/11 0754  Height: 5' 4.5" (1.638 m)  Weight: 166 lb (75.297 kg)   Body mass index is 28.05 kg/(m^2). Ideal Body Weight: Weight in (lb) to have BMI = 25: 147.6   GEN: WDWN, NAD, Non-toxic, A & O x 3 HEENT: Atraumatic, Normocephalic. Neck supple. No masses, No LAD.  Green nasal drainage Ears and Nose: No external deformity. CV: RRR, No M/G/R. No JVD. No thrill. No extra heart sounds. PULM: CTA B, no wheezes, crackles, rhonchi. No retractions. No resp. distress. No accessory muscle use. ABD: S, NT EXTR: No c/c/e NEURO Normal gait.  PSYCH: Normally interactive. Conversant. Not depressed or anxious appearing.  Calm demeanor.    Assessment and Plan:  cefzil mucinex Continue nasonex Follow up as needed Carmelina Dane, MD

## 2011-10-03 ENCOUNTER — Telehealth: Payer: Self-pay

## 2011-10-03 NOTE — Telephone Encounter (Signed)
Normal--good 

## 2011-10-03 NOTE — Telephone Encounter (Signed)
Pt informed of lab results, copy of labs mailed per pt's request.

## 2011-10-03 NOTE — Telephone Encounter (Signed)
Pt called requesting results of labs done 09/16/2011, please advise.

## 2011-11-19 ENCOUNTER — Ambulatory Visit (INDEPENDENT_AMBULATORY_CARE_PROVIDER_SITE_OTHER): Payer: 59 | Admitting: Family Medicine

## 2011-11-19 VITALS — BP 116/74 | HR 73 | Temp 98.4°F | Resp 18 | Ht 65.5 in | Wt 168.0 lb

## 2011-11-19 DIAGNOSIS — J029 Acute pharyngitis, unspecified: Secondary | ICD-10-CM

## 2011-11-19 DIAGNOSIS — J069 Acute upper respiratory infection, unspecified: Secondary | ICD-10-CM

## 2011-11-19 MED ORDER — AMOXICILLIN 500 MG PO CAPS: ORAL_CAPSULE | ORAL | Status: DC

## 2011-11-19 NOTE — Patient Instructions (Addendum)
1. Pharyngitis  POCT rapid strep A, amoxicillin (AMOXIL) 500 MG capsule   Upper Respiratory Infection, Adult An upper respiratory infection (URI) is also known as the common cold. It is often caused by a type of germ (virus). Colds are easily spread (contagious). You can pass it to others by kissing, coughing, sneezing, or drinking out of the same glass. Usually, you get better in 1 or 2 weeks.  HOME CARE   Only take medicine as told by your doctor.  Use a warm mist humidifier or breathe in steam from a hot shower.  Drink enough water and fluids to keep your pee (urine) clear or pale yellow.  Get plenty of rest.  Return to work when your temperature is back to normal or as told by your doctor. You may use a face mask and wash your hands to stop your cold from spreading. GET HELP RIGHT AWAY IF:   After the first few days, you feel you are getting worse.  You have questions about your medicine.  You have chills, shortness of breath, or brown or red spit (mucus).  You have yellow or brown snot (nasal discharge) or pain in the face, especially when you bend forward.  You have a fever, puffy (swollen) neck, pain when you swallow, or white spots in the back of your throat.  You have a bad headache, ear pain, sinus pain, or chest pain.  You have a high-pitched whistling sound when you breathe in and out (wheezing).  You have a lasting cough or cough up blood.  You have sore muscles or a stiff neck. MAKE SURE YOU:   Understand these instructions.  Will watch your condition.  Will get help right away if you are not doing well or get worse. Document Released: 06/29/2007 Document Revised: 04/04/2011 Document Reviewed: 05/17/2010 [facility] Patient Information 2013 [place], [place].

## 2011-11-19 NOTE — Progress Notes (Signed)
[address]   [place], [place]  [address]   [phone]  Subjective:    Patient ID: Megan Valentine, female    DOB: November 03, 1950, 61 y.o.   MRN: [id number]  HPIThis 61 y.o. female presents for evaluation of sore throat.  Onset two days ago.  No fever/chills/sweats.  No headache.  R ear pain.  +rhinorrhea clear.  +nasal congestion.  +PND.  Starting taking Mucinex bid.  Some cough; no SOB mild; no sputum.  No v/d.  Husband sick with similar symptoms.  Mild achiness.  Scheduled to get flu vaccine.   History of tube in R ear in past; R ear always hurts with URI symptoms.    +ST; +mild pain with swallowing; diffuse pain; no pain with talking or opening mouth; no drooling or spitting.  Wanted to catch symptoms early.  Not using Flonase or Atrovent nasal sprays.   PCP: Burton Apley.  Review of Systems  Constitutional: Negative for fever, chills, diaphoresis and fatigue.  HENT: Positive for ear pain, congestion, sore throat, rhinorrhea and postnasal drip. Negative for sneezing, mouth sores, trouble swallowing, voice change and ear discharge.   Respiratory: Positive for cough. Negative for shortness of breath.   Gastrointestinal: Negative for nausea, vomiting and diarrhea.  Skin: Negative for rash.        Past Medical History  Diagnosis Date  . Environmental allergies   . Arthritis   . GERD (gastroesophageal reflux disease)   . Hiatal hernia   . Mitral valve prolapse   . Hyperlipidemia   . Hypothyroidism   . Rectal prolapse   . Diverticulosis   . History of chicken pox   . Allergy   . Migraines   . IBS (irritable bowel syndrome)   . Chronic bronchitis     Past Surgical History  Procedure Date  . Abdominal hysterectomy 1999  . Tubal ligation 1977  . Appendectomy 1962  . Refractive surgery 2000    both eyes    Prior to Admission medications   Medication Sig Start Date End Date Taking? Authorizing Provider  aspirin 81 MG tablet Take 81 mg by mouth daily.   Yes Historical  Provider, MD  b complex vitamins tablet Take 1 tablet by mouth daily.   Yes Historical Provider, MD  cholecalciferol (VITAMIN D) 1000 UNITS tablet Take 1,000 Units by mouth daily.   Yes Historical Provider, MD  fluticasone (FLONASE) 50 MCG/ACT nasal spray Place 2 sprays into the nose daily.   Yes Historical Provider, MD  guaiFENesin (MUCINEX) 600 MG 12 hr tablet Take 1 tablet (600 mg total) by mouth 2 (two) times daily. 09/24/11 09/23/12 Yes Phillips Odor, MD  hydrochlorothiazide (HYDRODIURIL) 25 MG tablet Take 25 mg by mouth 2 (two) times daily.  03/08/11  Yes Historical Provider, MD  ipratropium (ATROVENT) 0.03 % nasal spray Place 2 sprays into the nose 2 (two) times daily. 09/03/11 09/02/12 Yes Heather M Marte, PA-C  KLOR-CON M20 20 MEQ tablet 20 mEq daily.  04/01/11  Yes Historical Provider, MD  loratadine (CLARITIN) 10 MG tablet Take 10 mg by mouth daily.   Yes Historical Provider, MD  mometasone (NASONEX) 50 MCG/ACT nasal spray Place 2 sprays into the nose daily. 09/24/11 09/23/12 Yes Phillips Odor, MD  Multiple Vitamins-Minerals (WOMENS HAIR, SKIN & NAILS) TABS Take 1 tablet by mouth daily.   Yes Historical Provider, MD  Naproxen Sodium 220 MG CAPS Take 2 capsules by mouth every morning.   Yes Historical Provider, MD  NON FORMULARY Mega Red  1 tablet daily   Yes Historical Provider, MD  pantoprazole (PROTONIX) 40 MG tablet Take 1 tablet (40 mg total) by mouth daily. 04/26/11 04/25/12 Yes Mardella Layman, MD  Polyethylene Glycol 3350 (MIRALAX PO) Take by mouth daily.   Yes Historical Provider, MD  PREMARIN 0.45 MG tablet Take 0.45 mg by mouth daily. Takes 1/2 tablet daily 03/08/11  Yes Historical Provider, MD  levothyroxine (SYNTHROID, LEVOTHROID) 88 MCG tablet Take 88 mcg by mouth daily.  03/14/11   Historical Provider, MD    Allergies  Allergen Reactions  . Tetracycline Hives and Itching    History   Social History  . Marital Status: Married    Spouse Name: N/A    Number of Children: N/A   . Years of Education: 12   Occupational History  . 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 Smoking status: Former Smoker    Types: Cigarettes    Quit date: 03/23/1976  . Smokeless tobacco: Never Used  . Alcohol Use: No  . Drug Use: No  . Sexually Active: Not on file   Other Topics Concern  . Not on file   Social History Narrative   Caffeine Use-yesRegular exercise-no    Family History  Problem Relation Age of Onset  . Stomach cancer Maternal Grandmother   . Hypertension Maternal Grandmother   . Diabetes Maternal Grandmother   . Stroke Maternal Grandmother   . Cancer Maternal Grandmother     Stomach cancer  . Colon cancer Paternal Grandfather   . Heart attack Paternal Grandfather   . Hyperlipidemia Mother   . Diabetes Mother   . Hypertension Mother   . Alzheimer's disease Mother   . Diabetes Father   . Hyperlipidemia Father   . Heart disease Father   . Heart attack Father   . Stroke Paternal Grandmother     Objective:   Physical Exam  Nursing note and vitals reviewed. Constitutional: She appears well-developed and well-nourished. No distress.  HENT:  Head: Normocephalic and atraumatic.  Right Ear: External ear and ear canal normal. Tympanic membrane mobility is abnormal.  Left Ear: External ear normal.  Nose: Nose normal.  Mouth/Throat: Oropharynx is clear and moist. No oropharyngeal exudate.  Eyes: Conjunctivae normal and EOM are normal. Pupils are equal, round, and reactive to light.  Neck: Normal range of motion. Neck supple. No thyromegaly present.  Cardiovascular: Normal rate, regular rhythm and normal heart sounds.   No murmur heard. Pulmonary/Chest: Effort normal and breath sounds normal. No respiratory distress. She has no wheezes. She has no rales.  Lymphadenopathy:    She has cervical adenopathy.  Skin: Skin is warm and dry. No rash noted. She is not diaphoretic.  Psychiatric: She has a normal mood and affect. Her behavior is  normal. Judgment and thought content normal.    Results for orders placed in visit on 11/19/11  POCT RAPID STREP A (OFFICE)      Component Value Range   Rapid Strep A Screen Negative  Negative         Assessment & Plan:   1. Pharyngitis  POCT rapid strep A  2. URI  1.  URI:  New.  Consistent with viral syndrome.  Continue Mucinex bid; restart Atrovent nasal spray PRN.  Supportive care with rest, fluids, Tylenol or Motrin. RTC for worsening sore throat or inability to swallow.  Rx for Amoxicillin provided to fill in 3-7 days if no improvement.

## 2011-11-19 NOTE — Progress Notes (Signed)
Reviewed and agree.

## 2011-11-29 ENCOUNTER — Telehealth: Payer: Self-pay | Admitting: Gastroenterology

## 2011-11-29 DIAGNOSIS — K219 Gastro-esophageal reflux disease without esophagitis: Secondary | ICD-10-CM

## 2011-11-29 MED ORDER — PANTOPRAZOLE SODIUM 40 MG PO TBEC: 40.0000 mg | DELAYED_RELEASE_TABLET | Freq: Every day | ORAL | Status: DC

## 2011-11-29 NOTE — Telephone Encounter (Signed)
Patient advised that we never received any correspondence from the pharmacy regarding protonix. I have sent a prescription to her pharmacy. She verbalizes understanding.

## 2012-02-26 ENCOUNTER — Encounter (HOSPITAL_COMMUNITY): Payer: Self-pay

## 2012-02-26 ENCOUNTER — Emergency Department (HOSPITAL_COMMUNITY): Payer: 59

## 2012-02-26 ENCOUNTER — Emergency Department (HOSPITAL_COMMUNITY)
Admission: EM | Admit: 2012-02-26 | Discharge: 2012-02-26 | Disposition: A | Discharge status: Home / Self Care | Payer: 59 | Attending: Emergency Medicine | Admitting: Emergency Medicine

## 2012-02-26 DIAGNOSIS — Z87891 Personal history of nicotine dependence: Secondary | ICD-10-CM | POA: Insufficient documentation

## 2012-02-26 DIAGNOSIS — R110 Nausea: Secondary | ICD-10-CM | POA: Insufficient documentation

## 2012-02-26 DIAGNOSIS — Z8619 Personal history of other infectious and parasitic diseases: Secondary | ICD-10-CM | POA: Insufficient documentation

## 2012-02-26 DIAGNOSIS — E876 Hypokalemia: Secondary | ICD-10-CM

## 2012-02-26 DIAGNOSIS — Z7982 Long term (current) use of aspirin: Secondary | ICD-10-CM | POA: Insufficient documentation

## 2012-02-26 DIAGNOSIS — Z8739 Personal history of other diseases of the musculoskeletal system and connective tissue: Secondary | ICD-10-CM | POA: Insufficient documentation

## 2012-02-26 DIAGNOSIS — Z8679 Personal history of other diseases of the circulatory system: Secondary | ICD-10-CM | POA: Insufficient documentation

## 2012-02-26 DIAGNOSIS — E039 Hypothyroidism, unspecified: Secondary | ICD-10-CM | POA: Insufficient documentation

## 2012-02-26 DIAGNOSIS — Z8709 Personal history of other diseases of the respiratory system: Secondary | ICD-10-CM | POA: Insufficient documentation

## 2012-02-26 DIAGNOSIS — Z79899 Other long term (current) drug therapy: Secondary | ICD-10-CM | POA: Insufficient documentation

## 2012-02-26 DIAGNOSIS — Z8719 Personal history of other diseases of the digestive system: Secondary | ICD-10-CM | POA: Insufficient documentation

## 2012-02-26 DIAGNOSIS — E785 Hyperlipidemia, unspecified: Secondary | ICD-10-CM | POA: Insufficient documentation

## 2012-02-26 DIAGNOSIS — K5900 Constipation, unspecified: Secondary | ICD-10-CM

## 2012-02-26 DIAGNOSIS — R109 Unspecified abdominal pain: Secondary | ICD-10-CM

## 2012-02-26 LAB — CBC WITH DIFFERENTIAL/PLATELET
Eosinophils Absolute: 0.4 10*3/uL (ref 0.0–0.7)
Lymphocytes Relative: 39 % (ref 12–46)
Lymphs Abs: 3.1 10*3/uL (ref 0.7–4.0)
Neutro Abs: 3.4 10*3/uL (ref 1.7–7.7)
Neutrophils Relative %: 43 % (ref 43–77)
Platelets: 304 10*3/uL (ref 150–400)
RBC: 4.29 MIL/uL (ref 3.87–5.11)
WBC: 7.9 10*3/uL (ref 4.0–10.5)

## 2012-02-26 LAB — COMPREHENSIVE METABOLIC PANEL
ALT: 16 U/L (ref 0–35)
Alkaline Phosphatase: 87 U/L (ref 39–117)
CO2: 27 mEq/L (ref 19–32)
GFR calc Af Amer: 73 mL/min — ABNORMAL LOW (ref >90)
Glucose, Bld: 103 mg/dL — ABNORMAL HIGH (ref 70–99)
Potassium: 3 mEq/L — ABNORMAL LOW (ref 3.5–5.1)
Sodium: 140 mEq/L (ref 135–145)
Total Protein: 6.9 g/dL (ref 6.0–8.3)

## 2012-02-26 MED ORDER — POTASSIUM CHLORIDE CRYS ER 20 MEQ PO TBCR
40.0000 meq | EXTENDED_RELEASE_TABLET | Freq: Once | ORAL | Status: AC
  Administered 2012-02-26: 40 meq via ORAL
  Filled 2012-02-26: qty 2

## 2012-02-26 MED ORDER — ONDANSETRON HCL 4 MG/2ML IJ SOLN
4.0000 mg | Freq: Once | INTRAMUSCULAR | Status: AC
  Administered 2012-02-26: 4 mg via INTRAVENOUS
  Filled 2012-02-26: qty 2

## 2012-02-26 NOTE — ED Notes (Signed)
EMS- pt reports abd pain ongoing x 2 months.  Feels like her stomach is swollen.  Started getting worse last night with some nausea.  Taking miralax for constipation but no bm since yesterday am.

## 2012-02-26 NOTE — ED Provider Notes (Signed)
  Physical Exam  BP 95/53  Pulse 64  Temp 97.8 F (36.6 C) (Oral)  Resp 16  SpO2 97%  Physical Exam  ED Course  Procedures  MDM Received pt from Ivonne Andrew, PA-C at change of shift who was waiting for ultrasound to rule out gall stones. Ultrasound came back showing normal gall bladder and stable hepatic cysts. Peter had prepared pt for discharge with an increase in daily Miralax and follow up with primary care physician. I discussed ultrasound results with pt and the fact that her potassium was low at 3.0. Pt does take KlorCon 20 Meq at home. Pt understood the diagnosis, will discuss with her PCP and was in agreement with the plan.    US Abdomen Complete  02/26/2012  *RADIOLOGY REPORT*  Clinical Data:  Abdominal pain.  COMPLETE ABDOMINAL ULTRASOUND  Comparison:  08/01/2011  Findings:  Gallbladder:  No shadowing gallstones or echogenic sludge.  No gallbladder wall thickening or pericholecystic fluid.  Negative sonographic Murphy's sign according to the ultrasound technologist.  Common bile duct:  4.1 mm diameter, unremarkable.  Liver:  Multiple simple appearing cysts.  Largest is in the left lobe, 4 x 4.5 x 4.6 cm with some thin internal septations.  No solid liver lesion.  No intrahepatic biliary ductal dilatation. Background parenchyma unremarkable.  IVC:  Appears normal.  Pancreas:  No focal abnormality seen.  Spleen:  4.1 cm craniocaudal length, unremarkable.  Right Kidney:  10.6 cm. No hydronephrosis.  Well-preserved cortex. Normal size and parenchymal echotexture without focal abnormalities.  Left Kidney:  10.6 cm. No hydronephrosis.  Well-preserved cortex. Normal size and parenchymal echotexture without focal abnormalities.  Abdominal aorta:  No aneurysm identified.  IMPRESSION:  1.  Normal gallbladder. 2.  Stable hepatic cysts   Original Report Authenticated By: D. Andria Rhein, MD    Dg Abd Acute W/chest  02/26/2012  *RADIOLOGY REPORT*  Clinical Data: Abdominal pain and bloating.  Chronic  constipation. Nausea.  Vomiting.  ACUTE ABDOMEN SERIES (ABDOMEN 2 VIEW & CHEST 1 VIEW)  Comparison: 09/10/2009  Findings: Normal heart size and pulmonary vascularity.  No focal consolidation in the lungs.  No blunting of costophrenic angles. No pneumothorax.  Mediastinal contours appear intact.  No significant change since previous study.  Scattered gas and stool in the colon.  No small or large bowel dilatation.  No free intra-abdominal air.  No abnormal air fluid levels.  No radiopaque stones.  IMPRESSION: No evidence of active pulmonary disease.  Nonobstructive bowel gas pattern.   Original Report Authenticated By: Burman Nieves, M.D.     Glade Nurse, PA-C 02/26/12 [id number]  Glade Nurse, PA-C 02/26/12 [phone]

## 2012-02-26 NOTE — ED Notes (Signed)
Patient transported to X-ray 

## 2012-02-26 NOTE — ED Provider Notes (Signed)
Medical screening examination/treatment/procedure(s) were performed by non-physician practitioner and as supervising physician I was immediately available for consultation/collaboration.   Ethelyn Cerniglia M Tamrah Victorino, DO 02/26/12 [id number] 

## 2012-02-26 NOTE — ED Provider Notes (Signed)
History     CSN: [id number]  Arrival date & time 02/26/12  0350   First MD Initiated Contact with Patient 02/26/12 0402      Chief Complaint  Patient presents with  . Abdominal Pain   HPI  History provided by the patient. Patient is a 62 year old female with history of hyperlipidemia, hypothyroidism, diverticulosis, abdominal hysterectomy, appendectomy and irritable bowel syndrome with constipation who presents with complaints of abdominal pain. Patient states that she has been having a long history of some problems with constipation and abdominal pains. She has been seeing Dr. Jarold Motto with GI for her symptoms with a colonoscopy 1 year ago. She states that she has been treated for her constipation symptoms with one capful MiraLAX each night. Generally this doesn't well and patient reports having a soft stool yesterday morning. Tonight patient suddenly awoken with significantly worse pain greater in the upper abdomen area. She also reports feeling bloated with nausea symptoms. She denies any vomiting. She denies any associated fever, chills or sweats.    Past Medical History  Diagnosis Date  . Environmental allergies   . Arthritis   . GERD (gastroesophageal reflux disease)   . Hiatal hernia   . Mitral valve prolapse   . Hyperlipidemia   . Hypothyroidism   . Rectal prolapse   . Diverticulosis   . History of chicken pox   . Allergy   . Migraines   . IBS (irritable bowel syndrome)   . Chronic bronchitis     Past Surgical History  Procedure Date  . Abdominal hysterectomy 1999  . Tubal ligation 1977  . Appendectomy 1962  . Refractive surgery 2000    both eyes    Family History  Problem Relation Age of Onset  . Stomach cancer Maternal Grandmother   . Hypertension Maternal Grandmother   . Diabetes Maternal Grandmother   . Stroke Maternal Grandmother   . Cancer Maternal Grandmother     Stomach cancer  . Colon cancer Paternal Grandfather   . Heart attack Paternal  Grandfather   . Hyperlipidemia Mother   . Diabetes Mother   . Hypertension Mother   . Alzheimer's disease Mother   . Diabetes Father   . Hyperlipidemia Father   . Heart disease Father   . Heart attack Father   . Stroke Paternal Grandmother     History  Substance Use Topics  . Smoking status: Former Smoker    Types: Cigarettes    Quit date: 03/23/1976  . Smokeless tobacco: Never Used  . Alcohol Use: No    OB History    Grav Para Term Preterm Abortions TAB SAB Ect Mult Living                  Review of Systems  Constitutional: Negative for fever and chills.  Respiratory: Negative for shortness of breath.   Cardiovascular: Negative for chest pain.  Gastrointestinal: Positive for nausea, abdominal pain and constipation. Negative for vomiting, diarrhea and blood in stool.  Genitourinary: Negative for dysuria, frequency, hematuria and flank pain.  All other systems reviewed and are negative.    Allergies  Tetracycline  Home Medications   Current Outpatient Rx  Name  Route  Sig  Dispense  Refill  . AMOXICILLIN 500 MG PO CAPS      2 tablets twice daily x 10 days   40 capsule   0   . ASPIRIN 81 MG PO TABS   Oral   Take 81 mg by mouth daily.         Marland Kitchen  B COMPLEX PO TABS   Oral   Take 1 tablet by mouth daily.         Marland Kitchen VITAMIN D 1000 UNITS PO TABS   Oral   Take 1,000 Units by mouth daily.         Marland Kitchen FLUTICASONE PROPIONATE 50 MCG/ACT NA SUSP   Nasal   Place 2 sprays into the nose daily.         . GUAIFENESIN ER 600 MG PO TB12   Oral   Take 1 tablet (600 mg total) by mouth 2 (two) times daily.   20 tablet   1   . HYDROCHLOROTHIAZIDE 25 MG PO TABS   Oral   Take 25 mg by mouth 2 (two) times daily.          . IPRATROPIUM BROMIDE 0.03 % NA SOLN   Nasal   Place 2 sprays into the nose 2 (two) times daily.   30 mL   5   . KLOR-CON M20 20 MEQ PO TBCR      20 mEq daily.          Marland Kitchen LEVOTHYROXINE SODIUM 88 MCG PO TABS   Oral   Take 88 mcg by  mouth daily.          Marland Kitchen LORATADINE 10 MG PO TABS   Oral   Take 10 mg by mouth daily.         . MOMETASONE FUROATE 50 MCG/ACT NA SUSP   Nasal   Place 2 sprays into the nose daily.   17 g   12   . WOMENS HAIR, SKIN & NAILS PO TABS   Oral   Take 1 tablet by mouth daily.         Marland Kitchen NAPROXEN SODIUM 220 MG PO CAPS   Oral   Take 2 capsules by mouth every morning.         . NON FORMULARY      Mega Red 1 tablet daily         . PANTOPRAZOLE SODIUM 40 MG PO TBEC   Oral   Take 1 tablet (40 mg total) by mouth daily.   30 tablet   3   . MIRALAX PO   Oral   Take by mouth daily.         Marland Kitchen PREMARIN 0.45 MG PO TABS   Oral   Take 0.45 mg by mouth daily. Takes 1/2 tablet daily           BP 112/94  Pulse 72  Temp 97.8 F (36.6 C) (Oral)  Resp 18  SpO2 96%  Physical Exam  Nursing note and vitals reviewed. Constitutional: She is oriented to person, place, and time. She appears well-developed and well-nourished. No distress.  HENT:  Head: Normocephalic.  Eyes: Conjunctivae normal are normal.  Neck: Normal range of motion.  Cardiovascular: Normal rate and regular rhythm.   Pulmonary/Chest: Effort normal and breath sounds normal.  Abdominal: Soft. She exhibits distension. Bowel sounds are decreased. There is tenderness. There is no rebound, no guarding, no tenderness at McBurney's point and negative Murphy's sign.       Diffuse tenderness greatest in the right upper quadrant. No Murphy's sign  Musculoskeletal: Normal range of motion. She exhibits no edema.  Neurological: She is alert and oriented to person, place, and time.  Skin: Skin is warm and dry. No rash noted.  Psychiatric: She has a normal mood and affect. Her behavior is normal.    ED  Course  Procedures   Results for orders placed during the hospital encounter of 02/26/12  CBC WITH DIFFERENTIAL      Component Value Range   WBC 7.9  4.0 - 10.5 K/uL   RBC 4.29  3.87 - 5.11 MIL/uL   Hemoglobin 13.3   12.0 - 15.0 g/dL   HCT 16.1  09.6 - 04.5 %   MCV 91.4  78.0 - 100.0 fL   MCH 31.0  26.0 - 34.0 pg   MCHC 33.9  30.0 - 36.0 g/dL   RDW 40.9  81.1 - 91.4 %   Platelets 304  150 - 400 K/uL   Neutrophils Relative 43  43 - 77 %   Neutro Abs 3.4  1.7 - 7.7 K/uL   Lymphocytes Relative 39  12 - 46 %   Lymphs Abs 3.1  0.7 - 4.0 K/uL   Monocytes Relative 12  3 - 12 %   Monocytes Absolute 1.0  0.1 - 1.0 K/uL   Eosinophils Relative 5  0 - 5 %   Eosinophils Absolute 0.4  0.0 - 0.7 K/uL   Basophils Relative 1  0 - 1 %   Basophils Absolute 0.0  0.0 - 0.1 K/uL  COMPREHENSIVE METABOLIC PANEL      Component Value Range   Sodium 140  135 - 145 mEq/L   Potassium 3.0 (*) 3.5 - 5.1 mEq/L   Chloride 102  96 - 112 mEq/L   CO2 27  19 - 32 mEq/L   Glucose, Bld 103 (*) 70 - 99 mg/dL   BUN 18  6 - 23 mg/dL   Creatinine, Ser 7.82  0.50 - 1.10 mg/dL   Calcium 9.1  8.4 - 95.6 mg/dL   Total Protein 6.9  6.0 - 8.3 g/dL   Albumin 3.6  3.5 - 5.2 g/dL   AST 20  0 - 37 U/L   ALT 16  0 - 35 U/L   Alkaline Phosphatase 87  39 - 117 U/L   Total Bilirubin 0.4  0.3 - 1.2 mg/dL   GFR calc non Af Amer 63 (*) >90 mL/min   GFR calc Af Amer 73 (*) >90 mL/min  LIPASE, BLOOD      Component Value Range   Lipase 30  11 - 59 U/L        Dg Abd Acute W/chest  02/26/2012  *RADIOLOGY REPORT*  Clinical Data: Abdominal pain and bloating.  Chronic constipation. Nausea.  Vomiting.  ACUTE ABDOMEN SERIES (ABDOMEN 2 VIEW & CHEST 1 VIEW)  Comparison: 09/10/2009  Findings: Normal heart size and pulmonary vascularity.  No focal consolidation in the lungs.  No blunting of costophrenic angles. No pneumothorax.  Mediastinal contours appear intact.  No significant change since previous study.  Scattered gas and stool in the colon.  No small or large bowel dilatation.  No free intra-abdominal air.  No abnormal air fluid levels.  No radiopaque stones.  IMPRESSION: No evidence of active pulmonary disease.  Nonobstructive bowel gas pattern.    Original Report Authenticated By: Burman Nieves, M.D.      1. Abdominal pain   2. Constipation       MDM  4:15 AM patient seen and evaluated. Patient appears in mild discomfort but no acute distress. Patient offered pain medications but this time does not wish to have any pain is comfortable. Is not wish to have any medicine for nausea.  Patient continues to have unchanged symptoms with localized pain in the right upper quadrant. No  definitive Murphy sign on exam but she is tender. X-ray does show large amounts of stool and gas throughout without signs of SBO. I discussed with patient options to perform ultrasound to rule out gallstones as a secondary cause of her pain and nausea today. Patient does wish to have ultrasound performed.  Patient discussed in sign out with Glade Nurse PA-C.  She will follow [place] results.      Angus Seller, [place] 02/26/12 [id number]

## 2012-02-27 ENCOUNTER — Telehealth: Payer: Self-pay | Admitting: Gastroenterology

## 2012-02-27 NOTE — Telephone Encounter (Signed)
Pt reports she is better now. ER instructed her to double up on Miralax and take Magnesium Citrate. She reports she had been to her PCP who instructed her not to take any laxatives and she guesses that's what messed her up. She is taking Miralax only and asked how to double up. Advised her to take the Miralax BID or she can take the 2 doses back to back for a purge. Hopefully she can get back to daily Miralax, if not, she is to call.

## 2012-03-05 ENCOUNTER — Other Ambulatory Visit: Payer: Self-pay | Admitting: Internal Medicine

## 2012-03-05 DIAGNOSIS — Z1239 Encounter for other screening for malignant neoplasm of breast: Secondary | ICD-10-CM

## 2012-03-23 ENCOUNTER — Other Ambulatory Visit: Payer: Self-pay | Admitting: Gastroenterology

## 2012-04-04 ENCOUNTER — Ambulatory Visit
Admission: RE | Admit: 2012-04-04 | Discharge: 2012-04-04 | Disposition: A | Discharge status: Home / Self Care | Payer: 59 | Source: Ambulatory Visit | Attending: Internal Medicine | Admitting: Internal Medicine

## 2012-04-04 DIAGNOSIS — Z1239 Encounter for other screening for malignant neoplasm of breast: Secondary | ICD-10-CM

## 2012-04-05 ENCOUNTER — Other Ambulatory Visit: Payer: Self-pay | Admitting: Gastroenterology

## 2012-05-07 ENCOUNTER — Other Ambulatory Visit: Payer: Self-pay | Admitting: Gastroenterology

## 2012-05-07 NOTE — Telephone Encounter (Signed)
PATIENT WILL NEED AN OFFICE VISIT FOR FURTHER REFILLS  

## 2012-06-02 ENCOUNTER — Other Ambulatory Visit: Payer: Self-pay | Admitting: Gastroenterology

## 2012-06-04 NOTE — Telephone Encounter (Signed)
PATIENT WILL NEED AN OFFICE VISIT FOR FURTHER REFILLS  

## 2012-06-09 ENCOUNTER — Ambulatory Visit (INDEPENDENT_AMBULATORY_CARE_PROVIDER_SITE_OTHER): Payer: 59 | Admitting: Family Medicine

## 2012-06-09 VITALS — BP 110/68 | HR 80 | Temp 98.4°F | Resp 17 | Ht 65.0 in | Wt 164.0 lb

## 2012-06-09 DIAGNOSIS — J0100 Acute maxillary sinusitis, unspecified: Secondary | ICD-10-CM

## 2012-06-09 DIAGNOSIS — J309 Allergic rhinitis, unspecified: Secondary | ICD-10-CM

## 2012-06-09 MED ORDER — AMOXICILLIN 875 MG PO TABS: 875.0000 mg | ORAL_TABLET | Freq: Two times a day (BID) | ORAL | Status: DC

## 2012-06-09 NOTE — Patient Instructions (Addendum)
Allergies are likely the primary cause of your symptoms.  Restart nasal sprays for allergies, and claritin over the counter if needed.  Saline nasal spray for congestion and rewetting drops for dry eyes. If not improving next few days - can start amoxicillin antibiotic. Marland KitchenReturn to the clinic or go to the nearest emergency room if any of your symptoms worsen or new symptoms occur. Allergic Rhinitis Allergic rhinitis is when the mucous membranes in the nose respond to allergens. Allergens are particles in the air that cause your body to have an allergic reaction. This causes you to release allergic antibodies. Through a chain of events, these eventually cause you to release histamine into the blood stream (hence the use of antihistamines). Although meant to be protective to the body, it is this release that causes your discomfort, such as frequent sneezing, congestion and an itchy runny nose.  CAUSES  The pollen allergens may come from grasses, trees, and weeds. This is seasonal allergic rhinitis, or "hay fever." Other allergens cause year-round allergic rhinitis (perennial allergic rhinitis) such as house dust mite allergen, pet dander and mold spores.  SYMPTOMS   Nasal stuffiness (congestion).  Runny, itchy nose with sneezing and tearing of the eyes.  There is often an itching of the mouth, eyes and ears. It cannot be cured, but it can be controlled with medications. DIAGNOSIS  If you are unable to determine the offending allergen, skin or blood testing may find it. TREATMENT   Avoid the allergen.  Medications and allergy shots (immunotherapy) can help.  Hay fever may often be treated with antihistamines in pill or nasal spray forms. Antihistamines block the effects of histamine. There are over-the-counter medicines that may help with nasal congestion and swelling around the eyes. Check with your caregiver before taking or giving this medicine. If the treatment above does not work, there are  many new medications your caregiver can prescribe. Stronger medications may be used if initial measures are ineffective. Desensitizing injections can be used if medications and avoidance fails. Desensitization is when a patient is given ongoing shots until the body becomes less sensitive to the allergen. Make sure you follow up with your caregiver if problems continue. SEEK MEDICAL CARE IF:   You develop fever (more than 100.5 F (38.1 C).  You develop a cough that does not stop easily (persistent).  You have shortness of breath.  You start wheezing.  Symptoms interfere with normal daily activities. Document Released: 10/05/2000 Document Revised: 04/04/2011 Document Reviewed: 04/16/2008 [facility] Patient Information 2013 [place], [place]. Sinusitis Sinusitis is redness, soreness, and swelling (inflammation) of the paranasal sinuses. Paranasal sinuses are air pockets within the bones of your face (beneath the eyes, the middle of the forehead, or above the eyes). In healthy paranasal sinuses, mucus is able to drain out, and air is able to circulate through them by way of your nose. However, when your paranasal sinuses are inflamed, mucus and air can become trapped. This can allow bacteria and other germs to grow and cause infection. Sinusitis can develop quickly and last only a short time (acute) or continue over a long period (chronic). Sinusitis that lasts for more than 12 weeks is considered chronic.  CAUSES  Causes of sinusitis include:  Allergies.  Structural abnormalities, such as displacement of the cartilage that separates your nostrils (deviated septum), which can decrease the air flow through your nose and sinuses and affect sinus drainage.  Functional abnormalities, such as when the small hairs (cilia) that line your sinuses and  help remove mucus do not work properly or are not present. SYMPTOMS  Symptoms of acute and chronic sinusitis are the same. The primary symptoms are pain and  pressure around the affected sinuses. Other symptoms include:  Upper toothache.  Earache.  Headache.  Bad breath.  Decreased sense of smell and taste.  A cough, which worsens when you are lying flat.  Fatigue.  Fever.  Thick drainage from your nose, which often is green and may contain pus (purulent).  Swelling and warmth over the affected sinuses. DIAGNOSIS  Your caregiver will perform a physical exam. During the exam, your caregiver may:  Look in your nose for signs of abnormal growths in your nostrils (nasal polyps).  Tap over the affected sinus to check for signs of infection.  View the inside of your sinuses (endoscopy) with a special imaging device with a light attached (endoscope), which is inserted into your sinuses. If your caregiver suspects that you have chronic sinusitis, one or more of the following tests may be recommended:  Allergy tests.  Nasal culture A sample of mucus is taken from your nose and sent to a lab and screened for bacteria.  Nasal cytology A sample of mucus is taken from your nose and examined by your caregiver to determine if your sinusitis is related to an allergy. TREATMENT  Most cases of acute sinusitis are related to a viral infection and will resolve on their own within 10 days. Sometimes medicines are prescribed to help relieve symptoms (pain medicine, decongestants, nasal steroid sprays, or saline sprays).  However, for sinusitis related to a bacterial infection, your caregiver will prescribe antibiotic medicines. These are medicines that will help kill the bacteria causing the infection.  Rarely, sinusitis is caused by a fungal infection. In theses cases, your caregiver will prescribe antifungal medicine. For some cases of chronic sinusitis, surgery is needed. Generally, these are cases in which sinusitis recurs more than 3 times per year, despite other treatments. HOME CARE INSTRUCTIONS   Drink plenty of water. Water helps thin the  mucus so your sinuses can drain more easily.  Use a humidifier.  Inhale steam 3 to 4 times a day (for example, sit in the bathroom with the shower running).  Apply a warm, moist washcloth to your face 3 to 4 times a day, or as directed by your caregiver.  Use saline nasal sprays to help moisten and clean your sinuses.  Take over-the-counter or prescription medicines for pain, discomfort, or fever only as directed by your caregiver. SEEK IMMEDIATE MEDICAL CARE IF:  You have increasing pain or severe headaches.  You have nausea, vomiting, or drowsiness.  You have swelling around your face.  You have vision problems.  You have a stiff neck.  You have difficulty breathing. MAKE SURE YOU:   Understand these instructions.  Will watch your condition.  Will get help right away if you are not doing well or get worse. Document Released: 01/10/2005 Document Revised: 04/04/2011 Document Reviewed: 01/25/2011 [facility] Patient Information 2013 [place], [place].

## 2012-06-09 NOTE — Progress Notes (Signed)
Subjective:    Patient ID: Megan Valentine, female    DOB: 11-Mar-1950, 62 y.o.   MRN: [id number]  HPI Megan Valentine is a 62 y.o. female  Approximately 6 week hx of congestion, st, pnd. No fever. Saw PCP 1 month ago - given shot of abx, steroid.  More congestion past few days L face, and head.  Clear or yellow nasal congestion.  Hx of allergic rhinitis, but not taking nasal sprays, and stopped claritin as felt like these were drying out eyes.  Has had eye surgeries for cataracts.    Review of Systems  Constitutional: Negative for fever and chills.  HENT: Positive for ear pain, congestion, sore throat, postnasal drip and sinus pressure. Negative for trouble swallowing and ear discharge.   Respiratory: Positive for cough. Negative for shortness of breath.        Objective:   Physical Exam  Vitals reviewed. Constitutional: She is oriented to person, place, and time. She appears well-developed and well-nourished. No distress.  HENT:  Head: Normocephalic and atraumatic.  Right Ear: Hearing, tympanic membrane, external ear and ear canal normal.  Left Ear: Hearing, tympanic membrane, external ear and ear canal normal.  Nose: Mucosal edema present. Right sinus exhibits maxillary sinus tenderness. Right sinus exhibits no frontal sinus tenderness. Left sinus exhibits no maxillary sinus tenderness and no frontal sinus tenderness.  Mouth/Throat: Oropharynx is clear and moist. No oropharyngeal exudate.  Eyes: Conjunctivae and EOM are normal. Pupils are equal, round, and reactive to light.  Cardiovascular: Normal rate, regular rhythm, normal heart sounds and intact distal pulses.   No murmur heard. Pulmonary/Chest: Effort normal and breath sounds normal. No respiratory distress. She has no wheezes. She has no rhonchi.  Neurological: She is alert and oriented to person, place, and time.  Skin: Skin is warm and dry. No rash noted.  Psychiatric: She has a normal mood and affect. Her behavior is normal.        Assessment & Plan:  Megan Valentine is a 62 y.o. female Sinusitis, acute maxillary - Plan: amoxicillin (AMOXIL) 875 MG tablet  Allergic rhinitis  Discussed likely primary allergic rhinitis and persistent sx's as not taking any allergy meds.  Possible early secondary maxillary sinusitis.  Restart Flonase, atrovent  NS (has at home), claritin otc, rewetting drops for eyes and saline NS for congestion.  If not improving with R max sinus pressure in few days - can fill amoxicillin. RTC precautions.    Meds ordered this encounter  Medications  . amoxicillin (AMOXIL) 875 MG tablet    Sig: Take 1 tablet (875 mg total) by mouth 2 (two) times daily.    Dispense:  20 tablet    Refill:  0   Patient Instructions  Allergies are likely the primary cause of your symptoms.  Restart nasal sprays for allergies, and claritin over the counter if needed.  Saline nasal spray for congestion and rewetting drops for dry eyes. If not improving next few days - can start amoxicillin antibiotic. Marland KitchenReturn to the clinic or go to the nearest emergency room if any of your symptoms worsen or new symptoms occur. Allergic Rhinitis Allergic rhinitis is when the mucous membranes in the nose respond to allergens. Allergens are particles in the air that cause your body to have an allergic reaction. This causes you to release allergic antibodies. Through a chain of events, these eventually cause you to release histamine into the blood stream (hence the use of antihistamines). Although meant to be protective to the body,  it is this release that causes your discomfort, such as frequent sneezing, congestion and an itchy runny nose.  CAUSES  The pollen allergens may come from grasses, trees, and weeds. This is seasonal allergic rhinitis, or "hay fever." Other allergens cause year-round allergic rhinitis (perennial allergic rhinitis) such as house dust mite allergen, pet dander and mold spores.  SYMPTOMS   Nasal stuffiness  (congestion).  Runny, itchy nose with sneezing and tearing of the eyes.  There is often an itching of the mouth, eyes and ears. It cannot be cured, but it can be controlled with medications. DIAGNOSIS  If you are unable to determine the offending allergen, skin or blood testing may find it. TREATMENT   Avoid the allergen.  Medications and allergy shots (immunotherapy) can help.  Hay fever may often be treated with antihistamines in pill or nasal spray forms. Antihistamines block the effects of histamine. There are over-the-counter medicines that may help with nasal congestion and swelling around the eyes. Check with your caregiver before taking or giving this medicine. If the treatment above does not work, there are many new medications your caregiver can prescribe. Stronger medications may be used if initial measures are ineffective. Desensitizing injections can be used if medications and avoidance fails. Desensitization is when a patient is given ongoing shots until the body becomes less sensitive to the allergen. Make sure you follow up with your caregiver if problems continue. SEEK MEDICAL CARE IF:   You develop fever (more than 100.5 F (38.1 C).  You develop a cough that does not stop easily (persistent).  You have shortness of breath.  You start wheezing.  Symptoms interfere with normal daily activities. Document Released: 10/05/2000 Document Revised: 04/04/2011 Document Reviewed: 04/16/2008 [facility] Patient Information 2013 [place], [place]. Sinusitis Sinusitis is redness, soreness, and swelling (inflammation) of the paranasal sinuses. Paranasal sinuses are air pockets within the bones of your face (beneath the eyes, the middle of the forehead, or above the eyes). In healthy paranasal sinuses, mucus is able to drain out, and air is able to circulate through them by way of your nose. However, when your paranasal sinuses are inflamed, mucus and air can become trapped. This can  allow bacteria and other germs to grow and cause infection. Sinusitis can develop quickly and last only a short time (acute) or continue over a long period (chronic). Sinusitis that lasts for more than 12 weeks is considered chronic.  CAUSES  Causes of sinusitis include:  Allergies.  Structural abnormalities, such as displacement of the cartilage that separates your nostrils (deviated septum), which can decrease the air flow through your nose and sinuses and affect sinus drainage.  Functional abnormalities, such as when the small hairs (cilia) that line your sinuses and help remove mucus do not work properly or are not present. SYMPTOMS  Symptoms of acute and chronic sinusitis are the same. The primary symptoms are pain and pressure around the affected sinuses. Other symptoms include:  Upper toothache.  Earache.  Headache.  Bad breath.  Decreased sense of smell and taste.  A cough, which worsens when you are lying flat.  Fatigue.  Fever.  Thick drainage from your nose, which often is green and may contain pus (purulent).  Swelling and warmth over the affected sinuses. DIAGNOSIS  Your caregiver will perform a physical exam. During the exam, your caregiver may:  Look in your nose for signs of abnormal growths in your nostrils (nasal polyps).  Tap over the affected sinus to check for signs of  infection.  View the inside of your sinuses (endoscopy) with a special imaging device with a light attached (endoscope), which is inserted into your sinuses. If your caregiver suspects that you have chronic sinusitis, one or more of the following tests may be recommended:  Allergy tests.  Nasal culture A sample of mucus is taken from your nose and sent to a lab and screened for bacteria.  Nasal cytology A sample of mucus is taken from your nose and examined by your caregiver to determine if your sinusitis is related to an allergy. TREATMENT  Most cases of acute sinusitis are related  to a viral infection and will resolve on their own within 10 days. Sometimes medicines are prescribed to help relieve symptoms (pain medicine, decongestants, nasal steroid sprays, or saline sprays).  However, for sinusitis related to a bacterial infection, your caregiver will prescribe antibiotic medicines. These are medicines that will help kill the bacteria causing the infection.  Rarely, sinusitis is caused by a fungal infection. In theses cases, your caregiver will prescribe antifungal medicine. For some cases of chronic sinusitis, surgery is needed. Generally, these are cases in which sinusitis recurs more than 3 times per year, despite other treatments. HOME CARE INSTRUCTIONS   Drink plenty of water. Water helps thin the mucus so your sinuses can drain more easily.  Use a humidifier.  Inhale steam 3 to 4 times a day (for example, sit in the bathroom with the shower running).  Apply a warm, moist washcloth to your face 3 to 4 times a day, or as directed by your caregiver.  Use saline nasal sprays to help moisten and clean your sinuses.  Take over-the-counter or prescription medicines for pain, discomfort, or fever only as directed by your caregiver. SEEK IMMEDIATE MEDICAL CARE IF:  You have increasing pain or severe headaches.  You have nausea, vomiting, or drowsiness.  You have swelling around your face.  You have vision problems.  You have a stiff neck.  You have difficulty breathing. MAKE SURE YOU:   Understand these instructions.  Will watch your condition.  Will get help right away if you are not doing well or get worse. Document Released: 01/10/2005 Document Revised: 04/04/2011 Document Reviewed: 01/25/2011 [facility] Patient Information 2013 [place], [place].

## 2012-08-02 ENCOUNTER — Other Ambulatory Visit: Payer: Self-pay | Admitting: Gastroenterology

## 2012-09-09 ENCOUNTER — Other Ambulatory Visit: Payer: Self-pay | Admitting: Gastroenterology

## 2012-09-09 ENCOUNTER — Ambulatory Visit (INDEPENDENT_AMBULATORY_CARE_PROVIDER_SITE_OTHER): Payer: 59 | Admitting: Family Medicine

## 2012-09-09 VITALS — BP 122/78 | HR 96 | Temp 98.0°F | Resp 17 | Ht 65.0 in | Wt 167.0 lb

## 2012-09-09 DIAGNOSIS — J0100 Acute maxillary sinusitis, unspecified: Secondary | ICD-10-CM

## 2012-09-09 DIAGNOSIS — J329 Chronic sinusitis, unspecified: Secondary | ICD-10-CM

## 2012-09-09 MED ORDER — AMOXICILLIN 875 MG PO TABS: 875.0000 mg | ORAL_TABLET | Freq: Two times a day (BID) | ORAL | Status: DC

## 2012-09-09 NOTE — Progress Notes (Signed)
Patient ID: Megan Valentine MRN: [id number], DOB: 1950-11-15, 62 y.o. Date of Encounter: 09/09/2012, 11:12 AM  Primary Physician: Lorenda Peck, MD  Chief Complaint:  Chief Complaint  Patient presents with  . 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 Afebrile. No chills. Nasal congestion thick and green/yellow. Sinus pressure is the worst symptom. Cough is productive secondary to post nasal drip and not associated with time of day. Ears feel full, leading to sensation of muffled hearing. Has tried OTC cold preps without success. No GI complaints.   Patient recently (last year) had cataract surgery on the left side. She previously undergone LASIK for that I. She had complication with an ectopic lens, followed by laser surgery to the lens to clear it. Still not able to see well  No recent antibiotics, recent travels, or sick contacts   No leg trauma, sedentary periods, h/o cancer, or tobacco use.  Past Medical History  Diagnosis Date  . Environmental allergies   . Arthritis   . GERD (gastroesophageal reflux disease)   . Hiatal hernia   . Mitral valve prolapse   . Hyperlipidemia   . Hypothyroidism   . Rectal prolapse   . Diverticulosis   . History of chicken pox   . Allergy   . Migraines   . IBS (irritable bowel syndrome)   . Chronic bronchitis   . Cataract      Home Meds: Prior to Admission medications   Medication Sig Start Date End Date Taking? Authorizing Provider  amoxicillin (AMOXIL) 875 MG tablet Take 1 tablet (875 mg total) by mouth 2 (two) times daily. 09/09/12  Yes Elvina Sidle, MD  aspirin 81 MG tablet Take 81 mg by mouth daily.   Yes Historical Provider, MD  cholecalciferol (VITAMIN D) 1000 UNITS tablet Take 1,000 Units by mouth daily.   Yes Historical Provider, MD  hydrochlorothiazide (HYDRODIURIL) 25 MG tablet  Take 25 mg by mouth daily.  03/08/11  Yes Historical Provider, MD  KLOR-CON M20 20 MEQ tablet Take 20 mEq by mouth daily.  04/01/11  Yes Historical Provider, MD  levothyroxine (SYNTHROID, LEVOTHROID) 88 MCG tablet Take 88 mcg by mouth daily.  03/14/11  Yes Historical Provider, MD  loratadine (CLARITIN) 10 MG tablet Take 10 mg by mouth daily.   Yes Historical Provider, MD  pantoprazole (PROTONIX) 40 MG tablet TAKE 1 TABLET BY MOUTH DAILY 05/07/12  Yes Mardella Layman, MD  Polyethylene Glycol 3350 (MIRALAX PO) Take by mouth daily.   Yes Historical Provider, MD  PREMARIN 0.45 MG tablet Take 0.45 mg by mouth daily. Takes 1/2 tablet daily 03/08/11  Yes Historical Provider, MD    Allergies:  Allergies  Allergen Reactions  . Tetracycline Hives and Itching    History   Social History  . Marital Status: Married    Spouse Name: N/A    Number of Children: N/A  . Years of Education: 12   Occupational History  . 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 Smoking status: Former Smoker    Types: Cigarettes    Quit date: 03/23/1976  . Smokeless tobacco: Never Used  . Alcohol Use: No  . Drug Use: No  . Sexual Activity: Yes    Birth Control/ Protection: Abstinence   Other Topics Concern  . Not on file   Social  History Narrative   Caffeine Use-yes   Regular exercise-no           Review of Systems: Constitutional: negative for chills, fever, night sweats or weight changes Cardiovascular: negative for chest pain or palpitations Respiratory: negative for hemoptysis, wheezing, or shortness of breath Abdominal: negative for abdominal pain, nausea, vomiting or diarrhea Dermatological: negative for rash Neurologic: negative for headache   Physical Exam: Blood pressure 122/78, pulse 96, temperature 98 F (36.7 C), temperature source Oral, resp. rate 17, height 5\' 5"  (1.651 m), weight 167 lb (75.751 kg), SpO2 96.00%., Body mass index is 27.79 kg/(m^2). General: Well developed,  well nourished, in no acute distress. Head: Normocephalic, atraumatic, eyes without discharge, sclera non-icteric, nares are congested. Bilateral auditory canals clear, TM's are without perforation, pearly grey with reflective cone of light bilaterally. Serous effusion bilaterally behind TM's. Maxillary sinus TTP. Oral cavity moist, dentition normal. Posterior pharynx with post nasal drip and mild erythema. No peritonsillar abscess or tonsillar exudate. Eyes:  Lens appears to have film over it with some ectopic placement and iridectomy. Neck: Supple. No thyromegaly. Full ROM. No lymphadenopathy. Lungs: Clear bilaterally to auscultation without wheezes, rales, or rhonchi. Breathing is unlabored.  Heart: RRR with S1 S2. No murmurs, rubs, or gallops appreciated. Msk:  Strength and tone normal for age. Extremities: No clubbing or cyanosis. No edema. Neuro: Alert and oriented X 3. Moves all extremities spontaneously. CNII-XII grossly in tact. Psych:  Responds to questions appropriately with a normal affect.    ASSESSMENT AND PLAN:  62 y.o. year old female with sinusitis Sinusitis, acute maxillary - Plan: amoxicillin (AMOXIL) 875 MG tablet   -Tylenol/Motrin prn -Rest/fluids -RTC precautions -RTC 3-5 days if no improvement  Signed, Elvina Sidle, MD 09/09/2012 11:12 AM

## 2012-09-17 ENCOUNTER — Other Ambulatory Visit: Payer: Self-pay | Admitting: Gastroenterology

## 2012-10-11 ENCOUNTER — Ambulatory Visit (INDEPENDENT_AMBULATORY_CARE_PROVIDER_SITE_OTHER): Payer: 59 | Admitting: Family Medicine

## 2012-10-11 ENCOUNTER — Ambulatory Visit: Payer: 59

## 2012-10-11 VITALS — BP 124/84 | HR 80 | Temp 98.7°F | Resp 16 | Ht 64.1 in | Wt 170.6 lb

## 2012-10-11 DIAGNOSIS — R1031 Right lower quadrant pain: Secondary | ICD-10-CM

## 2012-10-11 DIAGNOSIS — R110 Nausea: Secondary | ICD-10-CM

## 2012-10-11 DIAGNOSIS — R109 Unspecified abdominal pain: Secondary | ICD-10-CM

## 2012-10-11 LAB — POCT CBC
Granulocyte percent: 54.4 % (ref 37–80)
HCT, POC: 38.3 % (ref 37.7–47.9)
Hemoglobin: 12 g/dL — AB (ref 12.2–16.2)
Lymph, poc: 2.3 (ref 0.6–3.4)
MCH, POC: 30.8 pg (ref 27–31.2)
MCHC: 31.3 g/dL — AB (ref 31.8–35.4)
MCV: 98.2 fL — AB (ref 80–97)
MID (cbc): 0.7 (ref 0–0.9)
MPV: 8.7 fL (ref 0–99.8)
POC Granulocyte: 3.6 (ref 2–6.9)
POC LYMPH PERCENT: 34.9 % (ref 10–50)
POC MID %: 10.7 %M (ref 0–12)
Platelet Count, POC: 294 10*3/uL (ref 142–424)
RBC: 3.9 M/uL — AB (ref 4.04–5.48)
RDW, POC: 14.2 %
WBC: 6.6 10*3/uL (ref 4.6–10.2)

## 2012-10-11 LAB — POCT URINALYSIS DIPSTICK
Bilirubin, UA: NEGATIVE
Blood, UA: NEGATIVE
Glucose, UA: NEGATIVE
Ketones, UA: NEGATIVE
Leukocytes, UA: NEGATIVE
Nitrite, UA: NEGATIVE
Protein, UA: NEGATIVE
Spec Grav, UA: 1.025
Urobilinogen, UA: 0.2
pH, UA: 5

## 2012-10-11 LAB — POCT UA - MICROSCOPIC ONLY
Casts, Ur, LPF, POC: NEGATIVE
Crystals, Ur, HPF, POC: NEGATIVE
Mucus, UA: NEGATIVE
Yeast, UA: NEGATIVE

## 2012-10-11 NOTE — Progress Notes (Signed)
[facility]:  Office Visit  Chief Complaint:  Chief Complaint  Patient presents with  . Constipation    HPI: Megan Valentine is a 62 y.o. female who complains of   1 week history of right upper abd pain She drank a bottle of magnesium citrate and taking miralax and has had BM  She does have a history of constipation She is not sure if assoicated with food, she had a milk shake this AM and a chicke sandwich t TBL, abdominal surgery includes hysterectomy, and also  + nauseated without vomiting, history of IBS/diverticular dz. Denies fevers chills diarrhea Colonoscopy 1 year ago was normal  Past Medical History  Diagnosis Date  . Environmental allergies   . Arthritis   . GERD (gastroesophageal reflux disease)   . Hiatal hernia   . Mitral valve prolapse   . Hyperlipidemia   . Hypothyroidism   . Rectal prolapse   . Diverticulosis   . History of chicken pox   . Allergy   . Migraines   . IBS (irritable bowel syndrome)   . Chronic bronchitis   . Cataract    Past Surgical History  Procedure Laterality Date  . Abdominal hysterectomy  1999  . Tubal ligation  1977  . Appendectomy  1962  . Refractive surgery  2000    both eyes   History   Social History  . Marital Status: Married    Spouse Name: N/A    Number of Children: N/A  . Years of Education: 12   Occupational History  . 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 Smoking status: Former Smoker    Types: Cigarettes    Quit date: 03/23/1976  . Smokeless tobacco: Never Used  . Alcohol Use: No  . Drug Use: No  . Sexual Activity: Yes    Birth Control/ Protection: Abstinence   Other Topics Concern  . None   Social History Narrative   Caffeine Use-yes   Regular exercise-no         Family History  Problem Relation Age of Onset  . Stomach cancer Maternal Grandmother   . Hypertension Maternal Grandmother   . Diabetes Maternal Grandmother   . Stroke Maternal Grandmother   .  Cancer Maternal Grandmother     Stomach cancer  . Colon cancer Paternal Grandfather   . Heart attack Paternal Grandfather   . Hyperlipidemia Mother   . Diabetes Mother   . Hypertension Mother   . Alzheimer's disease Mother   . Diabetes Father   . Hyperlipidemia Father   . Heart disease Father   . Heart attack Father   . Stroke Paternal Grandmother    Allergies  Allergen Reactions  . Tetracycline Hives and Itching   Prior to Admission medications   Medication Sig Start Date End Date Taking? Authorizing Provider  aspirin 81 MG tablet Take 81 mg by mouth daily.   Yes Historical Provider, MD  cholecalciferol (VITAMIN D) 1000 UNITS tablet Take 1,000 Units by mouth daily.   Yes Historical Provider, MD  esomeprazole (NEXIUM) 10 MG packet Take 10 mg by mouth daily before breakfast.   Yes Historical Provider, MD  hydrochlorothiazide (HYDRODIURIL) 25 MG tablet Take 25 mg by mouth daily.  03/08/11  Yes Historical Provider, MD  KLOR-CON M20 20 MEQ tablet Take 20 mEq by mouth daily.  04/01/11  Yes Historical Provider, MD  levothyroxine (SYNTHROID, LEVOTHROID) 88 MCG tablet Take 88 mcg by mouth daily.  03/14/11  Yes Historical Provider, MD  loratadine (CLARITIN) 10 MG tablet Take 10 mg by mouth daily.   Yes Historical Provider, MD  Polyethylene Glycol 3350 (MIRALAX PO) Take by mouth daily.   Yes Historical Provider, MD  PREMARIN 0.45 MG tablet Take 0.45 mg by mouth daily. Takes 1/2 tablet daily 03/08/11  Yes Historical Provider, MD     ROS: The patient denies fevers, chills, night sweats, unintentional weight loss, chest pain, palpitations, wheezing, dyspnea on exertion,  dysuria, hematuria, melena, numbness, weakness, or tingling.   All other systems have been reviewed and were otherwise negative with the exception of those mentioned in the HPI and as above.    PHYSICAL EXAM: Filed Vitals:   10/11/12 1903  BP: 124/84  Pulse: 80  Temp: 98.7 F (37.1 C)  Resp: 16   Filed Vitals:   10/11/12  1903  Height: 5' 4.1" (1.628 m)  Weight: 170 lb 9.6 oz (77.384 kg)   Body mass index is 29.2 kg/(m^2).  General: Alert, no acute distress HEENT:  Normocephalic, atraumatic, oropharynx patent. EOMI, PERRLA Cardiovascular:  Regular rate and rhythm, no rubs murmurs or gallops.  No Carotid bruits, radial pulse intact. No pedal edema.  Respiratory: Clear to auscultation bilaterally.  No wheezes, rales, or rhonchi.  No cyanosis, no use of accessory musculature GI: No organomegaly, abdomen is soft and diffuse -tenderness right upper, right middle quadrant, positive bowel sounds.  No masses. Skin: No rashes. Neurologic: Facial musculature symmetric. Psychiatric: Patient is appropriate throughout our interaction. Lymphatic: No cervical lymphadenopathy Musculoskeletal: Gait intact.   LABS: Results for orders placed in visit on 10/11/12  POCT CBC      Result Value Range   WBC 6.6  4.6 - 10.2 K/uL   Lymph, poc 2.3  0.6 - 3.4   POC LYMPH PERCENT 34.9  10 - 50 %L   MID (cbc) 0.7  0 - 0.9   POC MID % 10.7  0 - 12 %M   POC Granulocyte 3.6  2 - 6.9   Granulocyte percent 54.4  37 - 80 %G   RBC 3.90 (*) 4.04 - 5.48 M/uL   Hemoglobin 12.0 (*) 12.2 - 16.2 g/dL   HCT, POC 16.1  09.6 - 47.9 %   MCV 98.2 (*) 80 - 97 fL   MCH, POC 30.8  27 - 31.2 pg   MCHC 31.3 (*) 31.8 - 35.4 g/dL   RDW, POC 04.5     Platelet Count, POC 294  142 - 424 K/uL   MPV 8.7  0 - 99.8 fL  POCT UA - MICROSCOPIC ONLY      Result Value Range   WBC, Ur, HPF, POC 0-1     RBC, urine, microscopic 0-1     Bacteria, U Microscopic tr     Mucus, UA neg     Epithelial cells, urine per micros 0-1     Crystals, Ur, HPF, POC neg     Casts, Ur, LPF, POC neg     Yeast, UA neg    POCT URINALYSIS DIPSTICK      Result Value Range   Color, UA yellow     Clarity, UA clear     Glucose, UA neg     Bilirubin, UA neg     Ketones, UA neg     Spec Grav, UA 1.025     Blood, UA neg     pH, UA 5.0     Protein, UA neg     Urobilinogen, UA  0.2     Nitrite, UA neg     Leukocytes, UA Negative       EKG/XRAY:   Primary read interpreted by Dr. Conley Rolls at [facility]. No free air No obstruction + moderate stool burden   ASSESSMENT/PLAN: Encounter Diagnoses  Name Primary?  . Acute abdominal pain Yes  . Right lower quadrant abdominal pain   . Nausea alone    Labs pending F/u with labs Will get [place] or CT pendign labs Gross sideeffects, risk and benefits, and alternatives of medications d/w patient. Patient is aware that all medications have potential sideeffects and we are unable to predict every sideeffect or drug-drug interaction that may occur.  Evva Din PHUONG, DO 10/11/2012 8:24 PM  Spoke to patient about labs, will try gas x and also ducolax suppository

## 2012-10-11 NOTE — Patient Instructions (Signed)

## 2012-10-12 LAB — COMPREHENSIVE METABOLIC PANEL
AST: 14 U/L (ref 0–37)
Alkaline Phosphatase: 89 U/L (ref 39–117)
BUN: 23 mg/dL (ref 6–23)
Creat: 1 mg/dL (ref 0.50–1.10)
Potassium: 4.1 mEq/L (ref 3.5–5.3)
Total Bilirubin: 0.5 mg/dL (ref 0.3–1.2)

## 2012-10-12 LAB — COMPREHENSIVE METABOLIC PANEL WITH GFR
ALT: 17 U/L (ref 0–35)
Albumin: 4.3 g/dL (ref 3.5–5.2)
CO2: 27 meq/L (ref 19–32)
Calcium: 9.5 mg/dL (ref 8.4–10.5)
Chloride: 104 meq/L (ref 96–112)
Glucose, Bld: 102 mg/dL — ABNORMAL HIGH (ref 70–99)
Sodium: 142 meq/L (ref 135–145)
Total Protein: 7.1 g/dL (ref 6.0–8.3)

## 2012-10-12 LAB — LIPASE: Lipase: 24 U/L (ref 0–75)

## 2012-10-18 ENCOUNTER — Encounter: Payer: Self-pay | Admitting: Family Medicine

## 2012-11-15 ENCOUNTER — Other Ambulatory Visit: Payer: Self-pay | Admitting: Emergency Medicine

## 2013-04-24 ENCOUNTER — Other Ambulatory Visit: Payer: Self-pay

## 2013-04-24 DIAGNOSIS — Z1231 Encounter for screening mammogram for malignant neoplasm of breast: Secondary | ICD-10-CM

## 2013-05-01 ENCOUNTER — Ambulatory Visit
Admission: RE | Admit: 2013-05-01 | Discharge: 2013-05-01 | Disposition: A | Discharge status: Home / Self Care | Payer: No Typology Code available for payment source | Source: Ambulatory Visit

## 2013-05-01 DIAGNOSIS — Z1231 Encounter for screening mammogram for malignant neoplasm of breast: Secondary | ICD-10-CM

## 2013-05-02 ENCOUNTER — Other Ambulatory Visit: Payer: Self-pay | Admitting: Internal Medicine

## 2013-05-02 DIAGNOSIS — R928 Other abnormal and inconclusive findings on diagnostic imaging of breast: Secondary | ICD-10-CM

## 2013-05-08 ENCOUNTER — Ambulatory Visit (INDEPENDENT_AMBULATORY_CARE_PROVIDER_SITE_OTHER): Payer: No Typology Code available for payment source | Admitting: General Surgery

## 2013-05-13 ENCOUNTER — Ambulatory Visit
Admission: RE | Admit: 2013-05-13 | Discharge: 2013-05-13 | Disposition: A | Discharge status: Home / Self Care | Payer: No Typology Code available for payment source | Source: Ambulatory Visit | Attending: Internal Medicine | Admitting: Internal Medicine

## 2013-05-13 DIAGNOSIS — R928 Other abnormal and inconclusive findings on diagnostic imaging of breast: Secondary | ICD-10-CM

## 2013-05-16 ENCOUNTER — Ambulatory Visit (INDEPENDENT_AMBULATORY_CARE_PROVIDER_SITE_OTHER): Payer: No Typology Code available for payment source | Admitting: General Surgery

## 2013-05-20 ENCOUNTER — Ambulatory Visit (INDEPENDENT_AMBULATORY_CARE_PROVIDER_SITE_OTHER): Payer: No Typology Code available for payment source | Admitting: General Surgery

## 2013-05-20 ENCOUNTER — Encounter (INDEPENDENT_AMBULATORY_CARE_PROVIDER_SITE_OTHER): Payer: Self-pay | Admitting: General Surgery

## 2013-05-20 VITALS — BP 126/80 | HR 72 | Temp 97.5°F | Ht 64.0 in | Wt 176.6 lb

## 2013-05-20 DIAGNOSIS — N816 Rectocele: Secondary | ICD-10-CM

## 2013-05-20 NOTE — Patient Instructions (Signed)
Rectocele  What is a rectocele? A rectocele is a bulging of the front wall of the rectum into the back wall of the vagina. Rectoceles are usually due to thinning of the rectovaginal septum (the tissue between the rectum and vagina) and weakening of the pelvic floor muscles. This is a very common defect; however, most women do not have symptoms. There can also be other pelvic organs that bulge into the vagina, leading to similar symptoms as rectocele, including the bladder (i.e., cystocele) and the small intestines (i.e. enterocele). What can lead to developing a rectocele? There are many things that can lead to weakening of the pelvic floor, resulting in a rectocele. These factors include: vaginal deliveries, birthing trauma during vaginal delivery (e.g. forceps delivery, vacuum delivery, tearing with a vaginal delivery, episiotomy during vaginal delivery), history of constipation, history of straining with bowel movements, and history of gynecological (hysterectomy) or rectal surgeries.  What are the symptoms associated with a rectocele? Most people with a small rectocele do not have symptoms and it is often only discovered during routine physical examination. When the rectocele is large, it most commonly presents with a noticeable bulge into the vagina. Other rectal symptoms may include: difficulty with evacuation during a bowel movement, the need to press against the vagina and/or space between the rectum and the vagina in order to have a bowel movement, straining with bowel movements, constipation, the urge to have multiple bowel movements throughout the day, and rectal pain. Occasionally, the stool becomes stuck in the bulge of the rectum, which is why it is difficult to have a bowel movement. Vaginal symptoms can include: pain with sexual intercourse (dyspareunia), vaginal bleeding, and a sense of fullness in the vagina. How can a rectocele be diagnosed? A rectocele is usually found incidentally  during a physical examination by your doctor. The evaluation of its severity, and potential relation to constipation symptoms, is hard to assess with physical examination alone. Further testing for a rectocele may include the use of a special x-ray study known as defecography (contrast material instilled into the rectum as an enema, followed by live x-ray imaging during a bowel movement). This study is very specific and can evaluate a rectocele's size and ability to completely empty.  How can a rectocele be treated? Rectoceles are not treated merely for their presence, but should only be addressed when they are associated with significant symptoms that interfere with quality of life. Prior to any treatment, there should be a thorough evaluation by your doctor to assess whether all of the complaints can be attributed to the presence of a rectocele alone. There are both medical and surgical treatment options for rectoceles. The majority of symptoms associated with a rectocele can be resolved with medical management; however, treatment depends on the severity of symptoms. How can a rectocele be treated with medical management only? It is very important to have a good bowel regimen in order to avoid constipation and straining with bowel movements. A high fiber diet, consisting of 25-30 grams of fiber daily, will help with this goal. This may be achieved with a fiber supplement, high fiber cereal, or high fiber bars. In addition to augmenting fiber intake, increased water intake (typically 6-8 glasses daily) is also highly recommended. This will allow for softer stools that do not require significant straining with bowel movements, thereby reducing your risk for having a bulge associated with a rectocele. Other treatments may include pelvic floor exercises such as Kegel exercises (i.e. biofeedback), stool softeners, hormone   replacement therapy, and avoidance of straining with bowel movements. At times, it is also  helpful to apply pressure to the back of the vagina during bowel movements.  How can a rectocele be treated with surgical management? The surgical management of rectoceles should only be considered when symptoms continue despite the use of medical management and are significant enough that they interfere with activities of daily living. There are abdominal, rectal, and vaginal surgeries that can be performed for rectoceles. The choice of procedure depends on the size of the rectocele and its associated symptoms. Most surgeries aim to remove the extra tissue that makes up the rectocele and strengthening the wall between the rectum and vagina with surrounding tissue or use of a mesh (i.e. patch). Colorectal surgeons, as well as gynecologists, are trained in the diagnosis and treatment of this condition. The success rate of the surgery depends upon the specific symptoms and symptom duration. Some of the risks of surgical correction of the rectocele are bleeding, infection, pain during intercourse (dyspareunia), as well as a risk that the rectocele may recur or worsen. What is a colon and rectal surgeon? Colon and rectal surgeons are experts in the surgical and non-surgical treatment of diseases of the colon, rectum and anus. They have completed advanced surgical training in the treatment of these diseases as well as full general surgical training. Board-certified colon and rectal surgeons complete residencies in general surgery and colon and rectal surgery, and pass intensive examinations conducted by the American Board of Surgery and the American Board of Colon and Rectal Surgery. They are well-versed in the treatment of both benign and malignant diseases of the colon, rectum and anus and are able to perform routine screening examinations and surgically treat conditions if indicated to do so. author: Jennifer Speranza, MD, FACS, FASCRS, on behalf of the ASCRS Public Relations Committee  2012 American Society of  Colon & Rectal Surgeons   

## 2013-05-20 NOTE — Progress Notes (Signed)
Chief Complaint  Patient presents with  . eval rectocele    HISTORY: Megan Valentine is a 63 y.o. female who presents to the office with a rectocele.  Pt has IBS and takes Miralax daily for constipation.  She occasionally splints her vagina to have a BM.  Other symptoms include occasional vaginal pain.  This had been occurring for 2 years.   She is asymptomatic.  Her bowel habits are regular and her bowel movements are soft with miralax. Her last colonoscopy was 2013. She denies any stool leakage.        Past Medical History  Diagnosis Date  . Environmental allergies   . Arthritis   . GERD (gastroesophageal reflux disease)   . Hiatal hernia   . Mitral valve prolapse   . Hyperlipidemia   . Hypothyroidism   . Rectal prolapse   . Diverticulosis   . History of chicken pox   . Allergy   . Migraines   . IBS (irritable bowel syndrome)   . Chronic bronchitis   . Cataract       Past Surgical History  Procedure Laterality Date  . Abdominal hysterectomy  1999  . Tubal ligation  1977  . Appendectomy  1962  . Refractive surgery  2000    both eyes        Current Outpatient Prescriptions  Medication Sig Dispense Refill  . aspirin 81 MG tablet Take 81 mg by mouth daily.      . cholecalciferol (VITAMIN D) 1000 UNITS tablet Take 1,000 Units by mouth daily.      Marland Kitchen esomeprazole (NEXIUM) 10 MG packet Take 10 mg by mouth daily before breakfast.      . fluticasone (FLONASE) 50 MCG/ACT nasal spray USE 2 SPRAYS IN EACH NOSTRIL EVERY DAY  16 g  9  . hydrochlorothiazide (HYDRODIURIL) 25 MG tablet Take 25 mg by mouth daily.       Marland Kitchen KLOR-CON M20 20 MEQ tablet Take 20 mEq by mouth daily.       Marland Kitchen levothyroxine (SYNTHROID, LEVOTHROID) 88 MCG tablet Take 88 mcg by mouth daily.       Marland Kitchen loratadine (CLARITIN) 10 MG tablet Take 10 mg by mouth daily.      . Polyethylene Glycol 3350 (MIRALAX PO) Take by mouth daily.      Marland Kitchen PREMARIN 0.45 MG tablet Take 0.45 mg by mouth daily. Takes 1/2 tablet daily       No  current facility-administered medications for this visit.      Allergies  Allergen Reactions  . Tetracycline Hives and Itching      Family History  Problem Relation Age of Onset  . Stomach cancer Maternal Grandmother   . Hypertension Maternal Grandmother   . Diabetes Maternal Grandmother   . Stroke Maternal Grandmother   . Cancer Maternal Grandmother     Stomach cancer  . Colon cancer Paternal Grandfather   . Heart attack Paternal Grandfather   . Hyperlipidemia Mother   . Diabetes Mother   . Hypertension Mother   . Alzheimer's disease Mother   . Diabetes Father   . Hyperlipidemia Father   . Heart disease Father   . Heart attack Father   . Stroke Paternal Grandmother     History   Social History  . Marital Status: Married    Spouse Name: N/A    Number of Children: N/A  . Years of Education: 12   Occupational History  . [employer] READER [employer]   Social  History Main Topics  . Smoking status: Former Smoker    Types: Cigarettes    Quit date: 03/23/1976  . Smokeless tobacco: Never Used  . Alcohol Use: No  . Drug Use: No  . Sexual Activity: Yes    Birth Control/ Protection: Abstinence   Other Topics Concern  . None   Social History Narrative   Caffeine Use-yes   Regular exercise-no            REVIEW OF SYSTEMS - PERTINENT POSITIVES ONLY: Review of Systems - General ROS: negative for - chills, fever or weight loss Hematological and Lymphatic ROS: negative for - bleeding problems, blood clots or bruising Respiratory ROS: no cough, shortness of breath, or wheezing Cardiovascular ROS: no chest pain or dyspnea on exertion Gastrointestinal ROS: no abdominal pain, change in bowel habits, or black or bloody stools Genito-Urinary ROS: no dysuria, trouble voiding, or hematuria  EXAM: Filed Vitals:   05/20/13 1545  BP: 126/80  Pulse: 72  Temp: 97.5 F (36.4 C)    General appearance: alert and cooperative Resp: clear to auscultation bilaterally Cardio:  regular rate and rhythm GI: normal findings: soft, non-tender  Anal Exam Findings: moderate rectocele, no palpable masses   ASSESSMENT AND PLAN: Megan Valentine is a 63 y.o. F with a relatively asymptomatic rectocele.  We discussed that given the pain of surgery needed to fix this and the high chance of recurrence after surgery, I would not recommend repairing this at this time.  If she develops more symptoms in the future (obstructed defecation, fecal leakage), she will call the office.  She was counseled to refrain from straining and to continue the MiraLax to avoid constipation.     Rosario Adie, MD Colon and Rectal Surgery / [facility]      Visit Diagnoses: 1. Rectocele     Primary Care Physician: Myriam Jacobson, MD

## 2013-08-28 ENCOUNTER — Other Ambulatory Visit: Payer: Self-pay | Admitting: Orthopaedic Surgery

## 2013-08-28 ENCOUNTER — Ambulatory Visit
Admission: RE | Admit: 2013-08-28 | Discharge: 2013-08-28 | Disposition: A | Discharge status: Home / Self Care | Payer: No Typology Code available for payment source | Source: Ambulatory Visit | Attending: Orthopaedic Surgery | Admitting: Orthopaedic Surgery

## 2013-08-28 DIAGNOSIS — M79661 Pain in right lower leg: Secondary | ICD-10-CM

## 2014-01-31 ENCOUNTER — Telehealth: Payer: Self-pay | Admitting: Internal Medicine

## 2014-01-31 NOTE — Telephone Encounter (Signed)
Rec'd records from [facility] at [address] [address] to [place]

## 2014-02-13 ENCOUNTER — Encounter: Payer: Self-pay | Admitting: Internal Medicine

## 2014-02-13 ENCOUNTER — Ambulatory Visit (INDEPENDENT_AMBULATORY_CARE_PROVIDER_SITE_OTHER): Payer: 59 | Admitting: Internal Medicine

## 2014-02-13 ENCOUNTER — Other Ambulatory Visit (INDEPENDENT_AMBULATORY_CARE_PROVIDER_SITE_OTHER): Payer: 59

## 2014-02-13 VITALS — BP 142/88 | HR 98 | Temp 97.8°F | Resp 16 | Ht 64.0 in | Wt 185.0 lb

## 2014-02-13 DIAGNOSIS — I10 Essential (primary) hypertension: Secondary | ICD-10-CM

## 2014-02-13 DIAGNOSIS — M150 Primary generalized (osteo)arthritis: Secondary | ICD-10-CM

## 2014-02-13 DIAGNOSIS — E039 Hypothyroidism, unspecified: Secondary | ICD-10-CM

## 2014-02-13 DIAGNOSIS — M1990 Unspecified osteoarthritis, unspecified site: Secondary | ICD-10-CM

## 2014-02-13 DIAGNOSIS — M159 Polyosteoarthritis, unspecified: Secondary | ICD-10-CM

## 2014-02-13 DIAGNOSIS — K589 Irritable bowel syndrome without diarrhea: Secondary | ICD-10-CM

## 2014-02-13 DIAGNOSIS — E785 Hyperlipidemia, unspecified: Secondary | ICD-10-CM

## 2014-02-13 LAB — LIPID PANEL
Cholesterol: 244 mg/dL — ABNORMAL HIGH (ref 0–200)
HDL: 52.1 mg/dL (ref >39.00)
NONHDL: 191.9
Total CHOL/HDL Ratio: 5
Triglycerides: 391 mg/dL — ABNORMAL HIGH (ref 0.0–149.0)
VLDL: 78.2 mg/dL — AB (ref 0.0–40.0)

## 2014-02-13 LAB — BASIC METABOLIC PANEL
BUN: 23 mg/dL (ref 6–23)
CO2: 25 meq/L (ref 19–32)
Calcium: 9.7 mg/dL (ref 8.4–10.5)
Chloride: 104 mEq/L (ref 96–112)
Creatinine, Ser: 0.81 mg/dL (ref 0.40–1.20)
GFR: 75.68 mL/min (ref >60.00)
GLUCOSE: 109 mg/dL — AB (ref 70–99)
Potassium: 3.3 mEq/L — ABNORMAL LOW (ref 3.5–5.1)
SODIUM: 140 meq/L (ref 135–145)

## 2014-02-13 LAB — TSH: TSH: 1.49 u[IU]/mL (ref 0.35–4.50)

## 2014-02-13 LAB — LDL CHOLESTEROL, DIRECT: Direct LDL: 159 mg/dL

## 2014-02-13 NOTE — Patient Instructions (Signed)
We will check your blood work today and call you back with the results.   Check with your insurance company to see if they cover the shingles shot as it would be worthwhile to decrease your risk of getting shingles again.   We have sent in the order for Dr. Durward Fortes, if you need any other things please let us know.   If you are doing well we will see you back in about 6 months to check on the thyroid. If you have problems or questions before then please feel free to call the office.   Health Maintenance Adopting a healthy lifestyle and getting preventive care can go a long way to promote health and wellness. Talk with your health care provider about what schedule of regular examinations is right for you. This is a good chance for you to check in with your provider about disease prevention and staying healthy. In between checkups, there are plenty of things you can do on your own. Experts have done a lot of research about which lifestyle changes and preventive measures are most likely to keep you healthy. Ask your health care provider for more information. WEIGHT AND DIET  Eat a healthy diet  Be sure to include plenty of vegetables, fruits, low-fat dairy products, and lean protein.  Do not eat a lot of foods high in solid fats, added sugars, or salt.  Get regular exercise. This is one of the most important things you can do for your health.  Most adults should exercise for at least 150 minutes each week. The exercise should increase your heart rate and make you sweat (moderate-intensity exercise).  Most adults should also do strengthening exercises at least twice a week. This is in addition to the moderate-intensity exercise.  Maintain a healthy weight  Body mass index (BMI) is a measurement that can be used to identify possible weight problems. It estimates body fat based on height and weight. Your health care provider can help determine your BMI and help you achieve or maintain a healthy  weight.  For females 8 years of age and older:   A BMI below 18.5 is considered underweight.  A BMI of 18.5 to 24.9 is normal.  A BMI of 25 to 29.9 is considered overweight.  A BMI of 30 and above is considered obese.  Watch levels of cholesterol and blood lipids  You should start having your blood tested for lipids and cholesterol at 64 years of age, then have this test every 5 years.  You may need to have your cholesterol levels checked more often if:  Your lipid or cholesterol levels are high.  You are older than 64 years of age.  You are at high risk for heart disease.  CANCER SCREENING   Lung Cancer  Lung cancer screening is recommended for adults 44-50 years old who are at high risk for lung cancer because of a history of smoking.  A yearly low-dose CT scan of the lungs is recommended for people who:  Currently smoke.  Have quit within the past 15 years.  Have at least a 30-pack-year history of smoking. A pack year is smoking an average of one pack of cigarettes a day for 1 year.  Yearly screening should continue until it has been 15 years since you quit.  Yearly screening should stop if you develop a health problem that would prevent you from having lung cancer treatment.  Breast Cancer  Practice breast self-awareness. This means understanding how your breasts  normally appear and feel.  It also means doing regular breast self-exams. Let your health care provider know about any changes, no matter how small.  If you are in your 20s or 30s, you should have a clinical breast exam (CBE) by a health care provider every 1-3 years as part of a regular health exam.  If you are 72 or older, have a CBE every year. Also consider having a breast X-ray (mammogram) every year.  If you have a family history of breast cancer, talk to your health care provider about genetic screening.  If you are at high risk for breast cancer, talk to your health care provider about  having an MRI and a mammogram every year.  Breast cancer gene (BRCA) assessment is recommended for women who have family members with BRCA-related cancers. BRCA-related cancers include:  Breast.  Ovarian.  Tubal.  Peritoneal cancers.  Results of the assessment will determine the need for genetic counseling and BRCA1 and BRCA2 testing. Cervical Cancer Routine pelvic examinations to screen for cervical cancer are no longer recommended for nonpregnant women who are considered low risk for cancer of the pelvic organs (ovaries, uterus, and vagina) and who do not have symptoms. A pelvic examination may be necessary if you have symptoms including those associated with pelvic infections. Ask your health care provider if a screening pelvic exam is right for you.   The Pap test is the screening test for cervical cancer for women who are considered at risk.  If you had a hysterectomy for a problem that was not cancer or a condition that could lead to cancer, then you no longer need Pap tests.  If you are older than 65 years, and you have had normal Pap tests for the past 10 years, you no longer need to have Pap tests.  If you have had past treatment for cervical cancer or a condition that could lead to cancer, you need Pap tests and screening for cancer for at least 20 years after your treatment.  If you no longer get a Pap test, assess your risk factors if they change (such as having a new sexual partner). This can affect whether you should start being screened again.  Some women have medical problems that increase their chance of getting cervical cancer. If this is the case for you, your health care provider may recommend more frequent screening and Pap tests.  The human papillomavirus (HPV) test is another test that may be used for cervical cancer screening. The HPV test looks for the virus that can cause cell changes in the cervix. The cells collected during the Pap test can be tested for  HPV.  The HPV test can be used to screen women 43 years of age and older. Getting tested for HPV can extend the interval between normal Pap tests from three to five years.  An HPV test also should be used to screen women of any age who have unclear Pap test results.  After 64 years of age, women should have HPV testing as often as Pap tests.  Colorectal Cancer  This type of cancer can be detected and often prevented.  Routine colorectal cancer screening usually begins at 64 years of age and continues through 64 years of age.  Your health care provider may recommend screening at an earlier age if you have risk factors for colon cancer.  Your health care provider may also recommend using home test kits to check for hidden blood in the stool.  A small camera at the end of a tube can be used to examine your colon directly (sigmoidoscopy or colonoscopy). This is done to check for the earliest forms of colorectal cancer.  Routine screening usually begins at age 67.  Direct examination of the colon should be repeated every 5-10 years through 64 years of age. However, you may need to be screened more often if early forms of precancerous polyps or small growths are found. Skin Cancer  Check your skin from head to toe regularly.  Tell your health care provider about any new moles or changes in moles, especially if there is a change in a mole's shape or color.  Also tell your health care provider if you have a mole that is larger than the size of a pencil eraser.  Always use sunscreen. Apply sunscreen liberally and repeatedly throughout the day.  Protect yourself by wearing long sleeves, pants, a wide-brimmed hat, and sunglasses whenever you are outside. HEART DISEASE, DIABETES, AND HIGH BLOOD PRESSURE   Have your blood pressure checked at least every 1-2 years. High blood pressure causes heart disease and increases the risk of stroke.  If you are between 8 years and 45 years old, ask  your health care provider if you should take aspirin to prevent strokes.  Have regular diabetes screenings. This involves taking a blood sample to check your fasting blood sugar level.  If you are at a normal weight and have a low risk for diabetes, have this test once every three years after 64 years of age.  If you are overweight and have a high risk for diabetes, consider being tested at a younger age or more often. PREVENTING INFECTION  Hepatitis B  If you have a higher risk for hepatitis B, you should be screened for this virus. You are considered at high risk for hepatitis B if:  You were born in a country where hepatitis B is common. Ask your health care provider which countries are considered high risk.  Your parents were born in a high-risk country, and you have not been immunized against hepatitis B (hepatitis B vaccine).  You have HIV or AIDS.  You use needles to inject street drugs.  You live with someone who has hepatitis B.  You have had sex with someone who has hepatitis B.  You get hemodialysis treatment.  You take certain medicines for conditions, including cancer, organ transplantation, and autoimmune conditions. Hepatitis C  Blood testing is recommended for:  Everyone born from [birth year] through 1965.  Anyone with known risk factors for hepatitis C. Sexually transmitted infections (STIs)  You should be screened for sexually transmitted infections (STIs) including gonorrhea and chlamydia if:  You are sexually active and are younger than 64 years of age.  You are older than 64 years of age and your health care provider tells you that you are at risk for this type of infection.  Your sexual activity has changed since you were last screened and you are at an increased risk for chlamydia or gonorrhea. Ask your health care provider if you are at risk.  If you do not have HIV, but are at risk, it may be recommended that you take a prescription medicine daily to  prevent HIV infection. This is called pre-exposure prophylaxis (PrEP). You are considered at risk if:  You are sexually active and do not regularly use condoms or know the HIV status of your partner(s).  You take drugs by injection.  You are sexually active  with a partner who has HIV. Talk with your health care provider about whether you are at high risk of being infected with HIV. If you choose to begin PrEP, you should first be tested for HIV. You should then be tested every 3 months for as long as you are taking PrEP.  PREGNANCY   If you are premenopausal and you may become pregnant, ask your health care provider about preconception counseling.  If you may become pregnant, take 400 to 800 micrograms (mcg) of folic acid every day.  If you want to prevent pregnancy, talk to your health care provider about birth control (contraception). OSTEOPOROSIS AND MENOPAUSE   Osteoporosis is a disease in which the bones lose minerals and strength with aging. This can result in serious bone fractures. Your risk for osteoporosis can be identified using a bone density scan.  If you are 36 years of age or older, or if you are at risk for osteoporosis and fractures, ask your health care provider if you should be screened.  Ask your health care provider whether you should take a calcium or vitamin D supplement to lower your risk for osteoporosis.  Menopause may have certain physical symptoms and risks.  Hormone replacement therapy may reduce some of these symptoms and risks. Talk to your health care provider about whether hormone replacement therapy is right for you.  HOME CARE INSTRUCTIONS   Schedule regular health, dental, and eye exams.  Stay current with your immunizations.   Do not use any tobacco products including cigarettes, chewing tobacco, or electronic cigarettes.  If you are pregnant, do not drink alcohol.  If you are breastfeeding, limit how much and how often you drink  alcohol.  Limit alcohol intake to no more than 1 drink per day for nonpregnant women. One drink equals 12 ounces of beer, 5 ounces of wine, or 1 ounces of hard liquor.  Do not use street drugs.  Do not share needles.  Ask your health care provider for help if you need support or information about quitting drugs.  Tell your health care provider if you often feel depressed.  Tell your health care provider if you have ever been abused or do not feel safe at home. Document Released: 07/26/2010 Document Revised: 05/27/2013 Document Reviewed: 12/12/2012 [employer] Patient Information 2015 [place], [place]. This information is not intended to replace advice given to you by your health care provider. Make sure you discuss any questions you have with your health care provider.

## 2014-02-13 NOTE — Progress Notes (Signed)
Pre visit review using our clinic review tool, if applicable. No additional management support is needed unless otherwise documented below in the visit note. 

## 2014-02-16 NOTE — Assessment & Plan Note (Signed)
Constipation predominant which she is dealing with fairly well at the time with over the counter medications.

## 2014-02-16 NOTE — Assessment & Plan Note (Signed)
Referral placed to orthopedics so she can go back. She is not to the need of replacement yet and is doing okay with injections.

## 2014-02-16 NOTE — Assessment & Plan Note (Signed)
Check BMP. She is currently taking potassium and HCTZ. May be able to change to triamterene/hctz instead of two separate medications.

## 2014-02-16 NOTE — Assessment & Plan Note (Signed)
Check TSH today and adjust dosing as appropriate.

## 2014-02-16 NOTE — Assessment & Plan Note (Signed)
Check lipid panel today, not currently on medication.

## 2014-02-16 NOTE — Progress Notes (Signed)
   Subjective:    Patient ID: Megan Valentine, female    DOB: Aug 26, 1950, 64 y.o.   MRN: [id number]  HPI The patient is a 63 YO female who comes in today to establish care. She is also having some ear pain for the last month or so. It is right sided and she denies loss of hearing. She has PMH of arthritis, hyperlipidemia, hypothyroidism, IBS. Her IBS is constipation predominant. She does follow with orthopedic surgery for her arthritis and needs a new referral for her insurance company. She thinks that the right ear pain could be related to some dental problems and sinuses. She has problems with it off and on.   Review of Systems  Constitutional: Negative for chills, activity change, appetite change, fatigue and unexpected weight change.  HENT: Positive for dental problem and ear pain. Negative for congestion, ear discharge, rhinorrhea and sinus pressure.   Respiratory: Negative for cough, chest tightness, shortness of breath and wheezing.   Cardiovascular: Negative for chest pain, palpitations and leg swelling.  Gastrointestinal: Positive for constipation. Negative for abdominal pain, diarrhea, blood in stool and abdominal distention.  Musculoskeletal: Positive for arthralgias. Negative for myalgias, back pain and gait problem.  Skin: Negative.   Neurological: Negative for dizziness, weakness, light-headedness, numbness and headaches.  Psychiatric/Behavioral: Negative.       Objective:   Physical Exam  Constitutional: She is oriented to person, place, and time. She appears well-developed and well-nourished.  HENT:  Head: Normocephalic and atraumatic.  Right Ear: External ear normal.  Left Ear: External ear normal.  Eyes: EOM are normal.  Neck: Normal range of motion.  Cardiovascular: Normal rate and regular rhythm.   Pulmonary/Chest: Effort normal and breath sounds normal. No respiratory distress. She has no wheezes. She has no rales.  Abdominal: Soft. Bowel sounds are normal. She  exhibits no distension. There is no tenderness. There is no rebound.  Musculoskeletal: She exhibits no edema.  Neurological: She is alert and oriented to person, place, and time. Coordination normal.  Skin: Skin is warm and dry.   Filed Vitals:   02/13/14 1009  BP: 142/88  Pulse: 98  Temp: 97.8 F (36.6 C)  TempSrc: Oral  Resp: 16  Height: 5\' 4"  (1.626 m)  Weight: 185 lb (83.915 kg)  SpO2: 95%      Assessment & Plan:

## 2014-02-19 ENCOUNTER — Telehealth: Payer: Self-pay | Admitting: Internal Medicine

## 2014-02-19 NOTE — Telephone Encounter (Signed)
I spoke w/pt. She did not need to cancel her existing appointment. Patient needed [place] referral completed. Referral submitted to [facility] ref # [id number] valid 02/19/14-08/20/14 for 6 visits. Patient is aware and will call [place] to reschedule her appointment.

## 2014-02-19 NOTE — Telephone Encounter (Signed)
Referral completed and faxed to [facility]. Auth # [id number] valid 02/19/14-08/20/14 for 6 visits

## 2014-02-19 NOTE — Telephone Encounter (Signed)
Andee Poles from [facility] [phone]  Patient has appointment on Friday and has [facility] compass. Dx. Z96.1 NPI# [id number] Dr. Rutherford Guys

## 2014-02-19 NOTE — Telephone Encounter (Signed)
Patient calling on referral. She scheduled her own appt with ortho for tomorrow. Referral dated 02/13/2014 oustanding. Advised her that she would need to cancel the one the made and we would schedule for her. She did ask for expedite because her knees are in great pain and she needs shots. Advised I would check with you.

## 2014-03-05 ENCOUNTER — Telehealth: Payer: Self-pay | Admitting: Internal Medicine

## 2014-03-05 NOTE — Telephone Encounter (Signed)
Pt called in and said that she needs refill on her  methylphenidate (RITALIN) 20 MG tablet [[id number]]  Protonix    [facility] on [address]

## 2014-03-06 ENCOUNTER — Other Ambulatory Visit: Payer: Self-pay | Admitting: Geriatric Medicine

## 2014-03-06 MED ORDER — PANTOPRAZOLE SODIUM 20 MG PO TBEC: 20.0000 mg | DELAYED_RELEASE_TABLET | Freq: Every day | ORAL | Status: DC

## 2014-03-06 NOTE — Telephone Encounter (Signed)
Sent protonix to pharmacy. Ritalin is not on med list and patient does not take it.

## 2014-03-06 NOTE — Telephone Encounter (Signed)
Called [facility] and they have never filled Ritalin for this pt.  I believe that the rx requested was requested by mistake.   There is a request just under the ritalin asking for rx refill of Protonix.

## 2014-03-07 ENCOUNTER — Other Ambulatory Visit: Payer: Self-pay | Admitting: Internal Medicine

## 2014-03-10 ENCOUNTER — Other Ambulatory Visit: Payer: Self-pay | Admitting: Internal Medicine

## 2014-03-10 ENCOUNTER — Other Ambulatory Visit: Payer: Self-pay | Admitting: Geriatric Medicine

## 2014-03-10 MED ORDER — HYDROCHLOROTHIAZIDE 25 MG PO TABS: 25.0000 mg | ORAL_TABLET | Freq: Every day | ORAL | Status: DC

## 2014-04-16 ENCOUNTER — Other Ambulatory Visit: Payer: Self-pay | Admitting: Geriatric Medicine

## 2014-04-16 ENCOUNTER — Telehealth: Payer: Self-pay | Admitting: Internal Medicine

## 2014-04-16 MED ORDER — PANTOPRAZOLE SODIUM 20 MG PO TBEC: 20.0000 mg | DELAYED_RELEASE_TABLET | Freq: Every day | ORAL | Status: DC

## 2014-04-16 MED ORDER — ESTRADIOL 0.5 MG PO TABS: 0.5000 mg | ORAL_TABLET | Freq: Every day | ORAL | Status: DC

## 2014-04-16 MED ORDER — HYDROCHLOROTHIAZIDE 25 MG PO TABS: 25.0000 mg | ORAL_TABLET | Freq: Every day | ORAL | Status: DC

## 2014-04-16 NOTE — Telephone Encounter (Signed)
Patient requesting a refill sent to [facility] on [address]  estradiol (ESTRACE) 0.5 MG tablet [[id number]]  pantoprazole (PROTONIX) 20 MG tablet [[id number]]    hydrochlorothiazide (HYDRODIURIL) 25 MG tablet [[id number]]

## 2014-04-16 NOTE — Telephone Encounter (Signed)
Sent to pharmacy 

## 2014-04-24 ENCOUNTER — Encounter: Payer: Self-pay | Admitting: Internal Medicine

## 2014-04-24 ENCOUNTER — Ambulatory Visit (INDEPENDENT_AMBULATORY_CARE_PROVIDER_SITE_OTHER): Payer: 59 | Admitting: Internal Medicine

## 2014-04-24 ENCOUNTER — Other Ambulatory Visit (INDEPENDENT_AMBULATORY_CARE_PROVIDER_SITE_OTHER): Payer: 59

## 2014-04-24 VITALS — BP 118/76 | HR 80 | Temp 97.9°F | Resp 14 | Ht 64.0 in | Wt 185.0 lb

## 2014-04-24 DIAGNOSIS — E039 Hypothyroidism, unspecified: Secondary | ICD-10-CM

## 2014-04-24 DIAGNOSIS — I10 Essential (primary) hypertension: Secondary | ICD-10-CM | POA: Diagnosis not present

## 2014-04-24 DIAGNOSIS — Z0000 Encounter for general adult medical examination without abnormal findings: Secondary | ICD-10-CM | POA: Diagnosis not present

## 2014-04-24 DIAGNOSIS — E785 Hyperlipidemia, unspecified: Secondary | ICD-10-CM

## 2014-04-24 LAB — COMPREHENSIVE METABOLIC PANEL
ALK PHOS: 92 U/L (ref 39–117)
ALT: 16 U/L (ref 0–35)
AST: 18 U/L (ref 0–37)
Albumin: 4.3 g/dL (ref 3.5–5.2)
BILIRUBIN TOTAL: 0.6 mg/dL (ref 0.2–1.2)
BUN: 18 mg/dL (ref 6–23)
CO2: 30 mEq/L (ref 19–32)
CREATININE: 0.84 mg/dL (ref 0.40–1.20)
Calcium: 9.8 mg/dL (ref 8.4–10.5)
Chloride: 101 mEq/L (ref 96–112)
GFR: 72.53 mL/min (ref >60.00)
Glucose, Bld: 99 mg/dL (ref 70–99)
POTASSIUM: 3 meq/L — AB (ref 3.5–5.1)
Sodium: 138 mEq/L (ref 135–145)
Total Protein: 7.6 g/dL (ref 6.0–8.3)

## 2014-04-24 LAB — BRAIN NATRIURETIC PEPTIDE: Pro B Natriuretic peptide (BNP): 24 pg/mL (ref 0.0–100.0)

## 2014-04-24 MED ORDER — ESTRADIOL 0.5 MG PO TABS: 0.5000 mg | ORAL_TABLET | Freq: Every day | ORAL | Status: DC

## 2014-04-24 NOTE — Patient Instructions (Signed)
Increase the estrogen pill to 1 pill daily to help with your symptoms. We will check some blood work to see if your heart is holding on to fluid as well as the potassium levels and call you back with the results.   Come back in about 6 months for a follow up.   If you have any problems or questions before then please feel free to call us.

## 2014-04-24 NOTE — Progress Notes (Signed)
Pre visit review using our clinic review tool, if applicable. No additional management support is needed unless otherwise documented below in the visit note. 

## 2014-04-25 ENCOUNTER — Encounter: Payer: Self-pay | Admitting: Internal Medicine

## 2014-04-25 DIAGNOSIS — Z13820 Encounter for screening for osteoporosis: Secondary | ICD-10-CM | POA: Insufficient documentation

## 2014-04-25 DIAGNOSIS — Z0000 Encounter for general adult medical examination without abnormal findings: Secondary | ICD-10-CM | POA: Insufficient documentation

## 2014-04-25 NOTE — Progress Notes (Signed)
   Subjective:    Patient ID: Megan Valentine, female    DOB: 08-10-1950, 64 y.o.   MRN: [id number]  HPI Here for wellness. Please see A/P for status and plan for chronic medical problems.   PMH, [facility], social history reviewed and updated.   Review of Systems  Constitutional: Negative for chills, activity change, appetite change, fatigue and unexpected weight change.  HENT: Negative for rhinorrhea and sinus pressure.   Respiratory: Negative for cough, chest tightness, shortness of breath and wheezing.   Cardiovascular: Negative for chest pain, palpitations and leg swelling.  Gastrointestinal: Negative for abdominal pain, diarrhea, blood in stool and abdominal distention.  Musculoskeletal: Positive for arthralgias. Negative for myalgias, back pain and gait problem.  Skin: Negative.   Neurological: Negative for dizziness, weakness, light-headedness, numbness and headaches.  Psychiatric/Behavioral: Negative.       Objective:   Physical Exam  Constitutional: She is oriented to person, place, and time. She appears well-developed and well-nourished.  HENT:  Head: Normocephalic and atraumatic.  Right Ear: External ear normal.  Left Ear: External ear normal.  Eyes: EOM are normal.  Neck: Normal range of motion.  Cardiovascular: Normal rate and regular rhythm.   Pulmonary/Chest: Effort normal and breath sounds normal. No respiratory distress. She has no wheezes. She has no rales.  Abdominal: Soft. Bowel sounds are normal. She exhibits no distension. There is no tenderness. There is no rebound.  Musculoskeletal: She exhibits no edema.  Neurological: She is alert and oriented to person, place, and time. Coordination normal.  Skin: Skin is warm and dry.   Filed Vitals:   04/24/14 0929  BP: 118/76  Pulse: 80  Temp: 97.9 F (36.6 C)  TempSrc: Oral  Resp: 14  Height: 5\' 4"  (1.626 m)  Weight: 185 lb (83.915 kg)  SpO2: 97%      Assessment & Plan:

## 2014-04-25 NOTE — Assessment & Plan Note (Signed)
Reviewed recent TSH labs with her. Continue current synthroid dosing. Stable.

## 2014-04-25 NOTE — Assessment & Plan Note (Signed)
Declines shingles and HIV screening. Up to date on tetanus, flu, mammogram and colonoscopy. Talked to her about sun protection and mole monitoring for growth and or color change.

## 2014-04-25 NOTE — Assessment & Plan Note (Signed)
Reviewed recent lipid panel (not done fasting) with elevated triglycerides. Continue current therapy of diet and exercise to maintain her cholesterol levels. Checking CMP for complications or side effects.

## 2014-04-25 NOTE — Assessment & Plan Note (Signed)
BP doing well, thinks she is holding on to fluid and wants to increase her hctz and I informed her that she does not have fluid on exam and this is the strongest dose that is effective. Checking kidney function today and BNP to reassure her she does not have heart failure.

## 2014-04-28 ENCOUNTER — Other Ambulatory Visit: Payer: Self-pay | Admitting: Internal Medicine

## 2014-04-28 MED ORDER — TRIAMTERENE-HCTZ 37.5-25 MG PO TABS: 1.0000 | ORAL_TABLET | Freq: Every day | ORAL | Status: DC

## 2014-05-20 ENCOUNTER — Other Ambulatory Visit: Payer: Self-pay

## 2014-05-20 DIAGNOSIS — Z1231 Encounter for screening mammogram for malignant neoplasm of breast: Secondary | ICD-10-CM

## 2014-05-23 ENCOUNTER — Ambulatory Visit
Admission: RE | Admit: 2014-05-23 | Discharge: 2014-05-23 | Disposition: A | Discharge status: Home / Self Care | Payer: 59 | Source: Ambulatory Visit

## 2014-05-23 DIAGNOSIS — Z1231 Encounter for screening mammogram for malignant neoplasm of breast: Secondary | ICD-10-CM

## 2014-07-05 ENCOUNTER — Encounter: Payer: Self-pay | Admitting: Family Medicine

## 2014-07-05 ENCOUNTER — Ambulatory Visit (INDEPENDENT_AMBULATORY_CARE_PROVIDER_SITE_OTHER): Payer: 59 | Admitting: Family Medicine

## 2014-07-05 ENCOUNTER — Ambulatory Visit: Payer: 59 | Admitting: Family Medicine

## 2014-07-05 VITALS — BP 120/80 | HR 73 | Temp 98.4°F | Resp 20 | Ht 64.0 in | Wt 187.2 lb

## 2014-07-05 DIAGNOSIS — J029 Acute pharyngitis, unspecified: Secondary | ICD-10-CM | POA: Diagnosis not present

## 2014-07-05 LAB — POCT RAPID STREP A (OFFICE): Rapid Strep A Screen: NEGATIVE

## 2014-07-05 NOTE — Progress Notes (Signed)
   Subjective:    Patient ID: Megan Valentine, female    DOB: 1951-01-20, 64 y.o.   MRN: [id number]  HPI Acute visit Saturday clinic. Onset yesterday of severe sore throat. She's had some mild cough. Mild nasal congestion. Right earache. Increased malaise. Denies any fevers or chills. No recent sick contacts. Denies any nausea, vomiting, or diarrhea.  Past Medical History  Diagnosis Date  . Environmental allergies   . Arthritis   . GERD (gastroesophageal reflux disease)   . Hiatal hernia   . Mitral valve prolapse   . Hyperlipidemia   . Hypothyroidism   . Rectal prolapse   . Diverticulosis   . History of chicken pox   . Allergy   . Migraines   . IBS (irritable bowel syndrome)   . Chronic bronchitis   . Cataract    Past Surgical History  Procedure Laterality Date  . Abdominal hysterectomy  1999  . Tubal ligation  1977  . Appendectomy  1962  . Refractive surgery  2000    both eyes    reports that she quit smoking about 38 years ago. Her smoking use included Cigarettes. She has never used smokeless tobacco. She reports that she does not drink alcohol or use illicit drugs. family history includes Alzheimer's disease in her mother; Cancer in her maternal grandmother; Colon cancer in her paternal grandfather; Diabetes in her father, maternal grandmother, and mother; Heart attack in her father and paternal grandfather; Heart disease in her father; Hyperlipidemia in her father and mother; Hypertension in her maternal grandmother and mother; Stomach cancer in her maternal grandmother; Stroke in her maternal grandmother and paternal grandmother. Allergies  Allergen Reactions  . Tetracycline Hives and Itching      Review of Systems  Constitutional: Positive for fatigue. Negative for fever and chills.  HENT: Positive for congestion and sore throat.   Respiratory: Positive for cough.        Objective:   Physical Exam  Constitutional: She appears well-developed and well-nourished.    HENT:  Right Ear: External ear normal.  Left Ear: External ear normal.  Posterior pharynx erythema. No exudate  Neck: Neck supple.  Cardiovascular: Normal rate and regular rhythm.   Pulmonary/Chest: Effort normal and breath sounds normal. No respiratory distress. She has no wheezes. She has no rales.  Lymphadenopathy:    She has no cervical adenopathy.  Skin: No rash noted.          Assessment & Plan:  Acute pharyngitis. Check rapid strep. If negative, treat symptomatically.

## 2014-07-05 NOTE — Progress Notes (Signed)
Pre visit review using our clinic review tool, if applicable. No additional management support is needed unless otherwise documented below in the visit note. 

## 2014-07-05 NOTE — Patient Instructions (Signed)

## 2014-07-08 ENCOUNTER — Telehealth: Payer: Self-pay | Admitting: *Deleted

## 2014-07-08 NOTE — Telephone Encounter (Signed)
[place] Night - Client TELEPHONE ADVICE RECORD [facility] Patient Name: Megan Valentine Gender: Female DOB: [birth year] Age: 64 Y 40 M 20 D Return Phone Number: [phone] (Primary) Address: [place]/State/Zip: [name] [address] Client [place] Night - Client Client Site [place] - Night Physician [place], [place] Type Call Call Type Triage / Clinical Relationship To Patient Self Return Phone Number [phone] (Primary) Chief Complaint Sore Throat Initial Comment Caller States her throat is swollen, feels like she can barely swallow PreDisposition Did not know what to do Nurse Assessment Nurse: Mirna Mires, RN, Katharine Look Date/Time (Eastern Time): 07/05/2014 9:10:40 AM Confirm and document reason for call. If symptomatic, describe symptoms. ---Caller states her throat is swollen, feels like she can barely swallow Has the patient traveled out of the country within the last 30 days? ---No Does the patient require triage? ---Yes Related visit to physician within the last 2 weeks? ---No Does the PT have any chronic conditions? (i.e. diabetes, asthma, etc.) ---No Guidelines Guideline Title Affirmed Question Affirmed Notes Nurse Date/Time Eilene Ghazi Time) Sore Throat SEVERE (e.g., excruciating) throat pain Plummer, RN, Katharine Look 07/05/2014 9:11:19 AM Disp. Time Eilene Ghazi Time) Disposition Final User 07/05/2014 9:13:09 AM See Physician within 24 Hours Yes Mirna Mires, RN, Willa Rough Understands: Yes Disagree/Comply: Comply Care Advice Given Per Guideline SEE PHYSICIAN WITHIN 24 HOURS: SORE THROAT - For relief of sore throat: * Sip warm chicken broth or apple juice. * Suck on hard candy or a throat lozenge (OTC). * Gargle with warm salt water four times a day. To make salt water, put 1/2 teaspoon of salt in 8 oz (240 ml) of warm water. PAIN OR FEVER MEDICINES: * For pain and fever relief, take  acetaminophen or ibuprofen. ACETAMINOPHEN (E.G., TYLENOL): * Take 650 mg (two 325 mg pills) by mouth every 4-6 hours as needed. Each Regular Strength Tylenol pill has 325 mg of acetaminophen. The most you should take each day is 3,250 mg (10 Regular Strength PLEASE NOTE: All timestamps contained within this report are represented as [place] Standard Time. CONFIDENTIALTY NOTICE: This fax transmission is intended only for the addressee. It contains information that is legally privileged, confidential or otherwise protected from use or disclosure. If you are not the intended recipient, you are strictly prohibited from reviewing, disclosing, copying using or disseminating any of this information or taking any action in reliance on or regarding this information. If you have received this fax in error, please notify us immediately by telephone so that we can arrange for its return to [place]. Phone: [phone], Toll-Free: [phone], Fax: [phone] Page: 2 of 2 Call Id: [id number] Care Advice Given Per Guideline pills a day). IBUPROFEN (E.G., MOTRIN, ADVIL): * Take 400 mg (two 200 mg pills) by mouth every 6 hours as needed. SOFT DIET: * Eat a soft diet. Cold drinks, popsicles, and milk shakes are especially good. Avoid citrus fruits. * Drink plenty of liquids so as to avoid dehydration (8-12 eight oz glasses each day). CALL BACK IF: * You become worse. CARE ADVICE given per Sore Throat (Adult) guideline. After Care Instructions Given Call Event Type User Date / Time Description Comments User: Laurey Arrow, RN Date/Time Eilene Ghazi Time): 07/05/2014 9:14:45 AM gave caller number to call for an appointment Referrals [facility]

## 2014-08-16 ENCOUNTER — Other Ambulatory Visit: Payer: Self-pay | Admitting: Internal Medicine

## 2014-08-25 ENCOUNTER — Encounter: Payer: Self-pay | Admitting: Internal Medicine

## 2014-08-25 ENCOUNTER — Ambulatory Visit (INDEPENDENT_AMBULATORY_CARE_PROVIDER_SITE_OTHER): Payer: 59 | Admitting: Internal Medicine

## 2014-08-25 VITALS — BP 122/76 | HR 88 | Temp 98.2°F | Resp 16 | Ht 64.0 in | Wt 188.8 lb

## 2014-08-25 DIAGNOSIS — J209 Acute bronchitis, unspecified: Secondary | ICD-10-CM | POA: Diagnosis not present

## 2014-08-25 MED ORDER — AZITHROMYCIN 250 MG PO TABS: ORAL_TABLET | ORAL | Status: DC

## 2014-08-25 MED ORDER — ALBUTEROL SULFATE 108 (90 BASE) MCG/ACT IN AEPB: 2.0000 | INHALATION_SPRAY | RESPIRATORY_TRACT | Status: DC | PRN

## 2014-08-25 NOTE — Progress Notes (Signed)
Pre visit review using our clinic review tool, if applicable. No additional management support is needed unless otherwise documented below in the visit note. 

## 2014-08-25 NOTE — Patient Instructions (Signed)
We have given you an inhaler which you can use when you feel short of breath.   I have sent in the antibiotic called azithromycin (also called the z-pack). Day 1 take 2 pills, Days 2-5 take 1 pill daily. Take all the pills until they are gone.  If you are not feeling better call us back.

## 2014-08-26 DIAGNOSIS — J209 Acute bronchitis, unspecified: Secondary | ICD-10-CM | POA: Insufficient documentation

## 2014-08-26 NOTE — Assessment & Plan Note (Signed)
Treat with azithromycin for the likely infection versus CAP. Pro air inhaler for SOB.

## 2014-08-26 NOTE — Progress Notes (Signed)
   Subjective:    Patient ID: Megan Valentine, female    DOB: September 25, 1950, 64 y.o.   MRN: [id number]  HPI The patient is a 64 YO woman who is coming in today for cough and SOB for 64 weeks. She has been having nasal drainage. At the beginning she had nose congestion which is now gone some. Now she is still having some dripping which is irritating her throat. Cough most of the time and SOB with exertion of normal activities. Some fevers and chills last week. Feeling worse now than at the beginning, it is gradually worsening. Has tried sudafed which has not helped much.   Review of Systems  Constitutional: Positive for activity change. Negative for fever, appetite change, fatigue and unexpected weight change.  HENT: Positive for congestion, ear pain and postnasal drip. Negative for ear discharge, rhinorrhea, sinus pressure, sneezing, sore throat and trouble swallowing.   Eyes: Negative.   Respiratory: Positive for cough and shortness of breath. Negative for chest tightness and wheezing.   Cardiovascular: Negative for chest pain, palpitations and leg swelling.  Gastrointestinal: Negative.       Objective:   Physical Exam  Constitutional: She is oriented to person, place, and time. She appears well-developed and well-nourished.  HENT:  Head: Normocephalic and atraumatic.  Right ear with redness of the canal, TM normal bilaterally. Oropharynx with redness and clear drainage, nose with crusting and swelling of the turbinates.   Eyes: EOM are normal.  Neck: Normal range of motion. No JVD present. No thyromegaly present.  Cardiovascular: Normal rate and regular rhythm.   Pulmonary/Chest: Effort normal. She has wheezes.  Mild expiratory wheeze on the left.   Abdominal: Soft. She exhibits no distension. There is no tenderness.  Lymphadenopathy:    She has cervical adenopathy.  Neurological: She is alert and oriented to person, place, and time.   Filed Vitals:   08/25/14 1603  BP: 122/76  Pulse:  88  Temp: 98.2 F (36.8 C)  TempSrc: Oral  Resp: 16  Height: 5\' 4"  (1.626 m)  Weight: 188 lb 12.8 oz (85.639 kg)  SpO2: 99%      Assessment & Plan:

## 2014-09-03 ENCOUNTER — Telehealth: Payer: Self-pay | Admitting: *Deleted

## 2014-09-03 NOTE — Telephone Encounter (Signed)
Pt states she saw md last week for bronchitis. She has completed zpac & inhaler that was given, but bronchitis is flaring up again. Constantly coughing not coughing up any phelgm, denies fever. Requesting md advisement. MD is out of office pls advise...Johny Chess

## 2014-09-03 NOTE — Telephone Encounter (Signed)
Notified pt with md response.../lmb 

## 2014-09-03 NOTE — Telephone Encounter (Signed)
Persistent cough after bronchitis is very common; as long as no fever, no more frequent cough and remains non productive, and no wheezing, should be ok to try otc delsym.  Repeat antibx are usually not needed.

## 2014-09-15 ENCOUNTER — Other Ambulatory Visit (INDEPENDENT_AMBULATORY_CARE_PROVIDER_SITE_OTHER): Payer: 59

## 2014-09-15 ENCOUNTER — Ambulatory Visit (INDEPENDENT_AMBULATORY_CARE_PROVIDER_SITE_OTHER): Payer: 59 | Admitting: Internal Medicine

## 2014-09-15 ENCOUNTER — Encounter: Payer: Self-pay | Admitting: Internal Medicine

## 2014-09-15 VITALS — BP 118/70 | HR 77 | Temp 98.5°F | Resp 12 | Ht 64.0 in | Wt 184.8 lb

## 2014-09-15 DIAGNOSIS — R079 Chest pain, unspecified: Secondary | ICD-10-CM | POA: Diagnosis not present

## 2014-09-15 DIAGNOSIS — R0789 Other chest pain: Secondary | ICD-10-CM | POA: Diagnosis not present

## 2014-09-15 LAB — BASIC METABOLIC PANEL
BUN: 20 mg/dL (ref 6–23)
CALCIUM: 9.7 mg/dL (ref 8.4–10.5)
CO2: 26 mEq/L (ref 19–32)
CREATININE: 0.99 mg/dL (ref 0.40–1.20)
Chloride: 100 mEq/L (ref 96–112)
GFR: 59.93 mL/min — AB (ref >60.00)
GLUCOSE: 102 mg/dL — AB (ref 70–99)
POTASSIUM: 3.1 meq/L — AB (ref 3.5–5.1)
Sodium: 140 mEq/L (ref 135–145)

## 2014-09-15 LAB — TROPONIN I: TNIDX: 0 ug/l (ref 0.00–0.06)

## 2014-09-15 LAB — BRAIN NATRIURETIC PEPTIDE: Pro B Natriuretic peptide (BNP): 33 pg/mL (ref 0.0–100.0)

## 2014-09-15 NOTE — Progress Notes (Signed)
   Subjective:    Patient ID: Megan Valentine, female    DOB: [birth year], 64 y.o.   MRN: [id number]  HPI The patient is a 64 YO female coming in for cough and chest discomfort. She was treated for lung infection at the beginning of August with antibiotics without improvement. Overall her cough is improving but she still has this discomfort in the left side of her chest. Present for several weeks and she thought it was from coughing and congestion. Present all the time and does not change. Not worsened with activity. Some SOB with activity. No fevers or chills. No nasal congestion. Minimal cough, not productive. Pain 4/10 and does not radiate anywhere. No pain in arm or jar. No history of known CAD.   Review of Systems  Constitutional: Negative for fever, activity change, appetite change, fatigue and unexpected weight change.  HENT: Negative for congestion, ear discharge, ear pain, postnasal drip, rhinorrhea, sinus pressure, sneezing, sore throat and trouble swallowing.   Eyes: Negative.   Respiratory: Positive for cough and shortness of breath. Negative for chest tightness and wheezing.        Cough is improving  Cardiovascular: Positive for chest pain. Negative for palpitations and leg swelling.  Gastrointestinal: Negative.       Objective:   Physical Exam  Constitutional: She is oriented to person, place, and time. She appears well-developed and well-nourished.  HENT:  Head: Normocephalic and atraumatic.  Right Ear: External ear normal.  Left Ear: External ear normal.     Eyes: EOM are normal.  Neck: Normal range of motion. No JVD present. No thyromegaly present.  Cardiovascular: Normal rate and regular rhythm.   Pulmonary/Chest: Effort normal and breath sounds normal. No respiratory distress. She has no wheezes.  Abdominal: Soft. She exhibits no distension. There is no tenderness.  Lymphadenopathy:    She has no cervical adenopathy.  Neurological: She is alert and oriented to person,  place, and time.   Filed Vitals:   09/15/14 0805  BP: 118/70  Pulse: 77  Temp: 98.5 F (36.9 C)  Resp: 12  Height: 5\' 4"  (1.626 m)  Weight: 184 lb 12.8 oz (83.825 kg)  SpO2: 98%   EKG: no old to compare, rate 68, intervals normal, no ST or T wave changes, no signs of old ischemia, regular rhythm.    Assessment & Plan:

## 2014-09-15 NOTE — Progress Notes (Signed)
Pre visit review using our clinic review tool, if applicable. No additional management support is needed unless otherwise documented below in the visit note. 

## 2014-09-15 NOTE — Assessment & Plan Note (Addendum)
EKG done and read by me without ischemic changes, checking troponin and BNP today. No tenderness to palpation on exam. Recheck BMP today.

## 2014-09-15 NOTE — Patient Instructions (Signed)
The EKG of your heart is normal. We will check some blood work to make sure that this is not coming from the heart but it is a low chance.   The most likely cause is a pulled muscle from all the coughing the last month or so. This should heal on its own as the coughing is getting better.   You can try tylenol or ibuprofen for the pain.

## 2014-09-16 ENCOUNTER — Other Ambulatory Visit: Payer: Self-pay | Admitting: Internal Medicine

## 2014-09-16 MED ORDER — POTASSIUM CHLORIDE ER 20 MEQ PO TBCR: 20.0000 meq | EXTENDED_RELEASE_TABLET | Freq: Every day | ORAL | Status: DC

## 2014-09-21 ENCOUNTER — Other Ambulatory Visit: Payer: Self-pay | Admitting: Internal Medicine

## 2014-10-13 ENCOUNTER — Encounter: Payer: Self-pay | Admitting: Gastroenterology

## 2014-10-24 ENCOUNTER — Ambulatory Visit: Payer: 59 | Admitting: Internal Medicine

## 2014-11-04 ENCOUNTER — Encounter: Payer: Self-pay | Admitting: Internal Medicine

## 2014-11-04 ENCOUNTER — Other Ambulatory Visit (INDEPENDENT_AMBULATORY_CARE_PROVIDER_SITE_OTHER): Payer: 59

## 2014-11-04 ENCOUNTER — Ambulatory Visit (INDEPENDENT_AMBULATORY_CARE_PROVIDER_SITE_OTHER): Payer: 59 | Admitting: Internal Medicine

## 2014-11-04 VITALS — BP 130/80 | HR 90 | Temp 98.1°F | Resp 14 | Ht 64.0 in | Wt 186.0 lb

## 2014-11-04 DIAGNOSIS — R5383 Other fatigue: Secondary | ICD-10-CM

## 2014-11-04 DIAGNOSIS — Z23 Encounter for immunization: Secondary | ICD-10-CM | POA: Diagnosis not present

## 2014-11-04 DIAGNOSIS — M898X1 Other specified disorders of bone, shoulder: Secondary | ICD-10-CM | POA: Insufficient documentation

## 2014-11-04 LAB — BASIC METABOLIC PANEL
BUN: 23 mg/dL (ref 6–23)
CALCIUM: 10.1 mg/dL (ref 8.4–10.5)
CO2: 24 mEq/L (ref 19–32)
Chloride: 104 mEq/L (ref 96–112)
Creatinine, Ser: 1 mg/dL (ref 0.40–1.20)
GFR: 59.21 mL/min — AB (ref >60.00)
GLUCOSE: 104 mg/dL — AB (ref 70–99)
Potassium: 3.6 mEq/L (ref 3.5–5.1)
SODIUM: 143 meq/L (ref 135–145)

## 2014-11-04 LAB — TSH: TSH: 2.26 u[IU]/mL (ref 0.35–4.50)

## 2014-11-04 LAB — T4, FREE: Free T4: 1.03 ng/dL (ref 0.60–1.60)

## 2014-11-04 NOTE — Patient Instructions (Signed)
We will have you use the lidocaine patches that you have at home on the shoulder. If this does not work schedule with Dr. Tamala Julian at our office for a trigger shot injection in the shoulder blade.   We have given you the flu shot today and are checking the blood work.  Come back in about 3-6 months or sooner if you are having problems.

## 2014-11-04 NOTE — Progress Notes (Signed)
Pre visit review using our clinic review tool, if applicable. No additional management support is needed unless otherwise documented below in the visit note. 

## 2014-11-04 NOTE — Assessment & Plan Note (Signed)
She does have lidocaine patches at home leftover from a hand procedure and have instructed her to try these for the pain. She can also schedule with sports medicine for trigger point injection.

## 2014-11-04 NOTE — Progress Notes (Signed)
   Subjective:    Patient ID: Megan Valentine, female    DOB: Oct 10, 1950, 64 y.o.   MRN: [id number]  HPI  The patient is a 64 YO female coming in for left shoulder blade pain. She has been dealing with it for about 3 months. Hurts only with pressure on the area so at night time and when sitting. Not affecting her range of motion and does not hurt with activity. No fevers or chills and no weight change. No other new concerns. No injury to the area. Using tylenol and meloxicam with moderate success.   Review of Systems  Constitutional: Negative for fever, activity change, appetite change, fatigue and unexpected weight change.  HENT: Negative for congestion, ear discharge, ear pain, postnasal drip, rhinorrhea, sinus pressure, sneezing, sore throat and trouble swallowing.   Respiratory: Negative for cough, chest tightness, shortness of breath and wheezing.   Cardiovascular: Negative for chest pain, palpitations and leg swelling.  Gastrointestinal: Negative.   Musculoskeletal: Positive for myalgias and arthralgias. Negative for back pain, gait problem, neck pain and neck stiffness.  Neurological: Negative.       Objective:   Physical Exam  Constitutional: She is oriented to person, place, and time. She appears well-developed and well-nourished.  HENT:  Head: Normocephalic and atraumatic.  Right Ear: External ear normal.  Left Ear: External ear normal.     Eyes: EOM are normal.  Neck: Normal range of motion. No JVD present. No thyromegaly present.  Cardiovascular: Normal rate and regular rhythm.   Pulmonary/Chest: Effort normal and breath sounds normal. No respiratory distress. She has no wheezes.  Abdominal: Soft. She exhibits no distension. There is no tenderness.  Musculoskeletal: She exhibits tenderness.  Point tenderness at the inferior aspect of the left scapula to tenderness. ROM intact left shoulder and no pain with ROM.  Lymphadenopathy:    She has no cervical adenopathy.    Neurological: She is alert and oriented to person, place, and time.   Filed Vitals:   11/04/14 0919  BP: 130/80  Pulse: 90  Temp: 98.1 F (36.7 C)  TempSrc: Oral  Resp: 14  Height: 5\' 4"  (1.626 m)  Weight: 186 lb (84.369 kg)  SpO2: 90%      Assessment & Plan:  Flu shot given at visit.

## 2014-11-12 ENCOUNTER — Other Ambulatory Visit: Payer: Self-pay | Admitting: Internal Medicine

## 2014-11-17 ENCOUNTER — Other Ambulatory Visit (INDEPENDENT_AMBULATORY_CARE_PROVIDER_SITE_OTHER): Payer: 59

## 2014-11-17 ENCOUNTER — Encounter: Payer: Self-pay | Admitting: Family Medicine

## 2014-11-17 ENCOUNTER — Telehealth: Payer: Self-pay | Admitting: Family Medicine

## 2014-11-17 ENCOUNTER — Ambulatory Visit (INDEPENDENT_AMBULATORY_CARE_PROVIDER_SITE_OTHER): Payer: 59 | Admitting: Family Medicine

## 2014-11-17 VITALS — BP 120/84 | HR 77 | Ht 61.0 in | Wt 185.0 lb

## 2014-11-17 DIAGNOSIS — M25512 Pain in left shoulder: Secondary | ICD-10-CM

## 2014-11-17 DIAGNOSIS — M9902 Segmental and somatic dysfunction of thoracic region: Secondary | ICD-10-CM

## 2014-11-17 DIAGNOSIS — M999 Biomechanical lesion, unspecified: Secondary | ICD-10-CM | POA: Insufficient documentation

## 2014-11-17 DIAGNOSIS — M170 Bilateral primary osteoarthritis of knee: Secondary | ICD-10-CM

## 2014-11-17 DIAGNOSIS — M899 Disorder of bone, unspecified: Secondary | ICD-10-CM

## 2014-11-17 DIAGNOSIS — M1712 Unilateral primary osteoarthritis, left knee: Secondary | ICD-10-CM | POA: Insufficient documentation

## 2014-11-17 NOTE — Patient Instructions (Signed)
Good to see you Ice is your friend Exercises 3 times a week.  Take tylenol 650 mg three times a day is the best evidence based medicine we have for arthritis.  Vitamin D 2000 IU daily Fish oil 2 grams daily.  Tumeric 500mg  twice daily.  If cortisone injections do not help, there are different types of shots that may help but they take longer to take effect.  We can discuss this at follow up.  It's important that you continue to stay active. Controlling your weight is important.  Shoe inserts with good arch support may be helpful.  [employer] [facility] at [employer] [facility] could help.  Water aerobics and cycling with low resistance are the best two types of exercise for arthritis. Come back and see me in 6 weeks.

## 2014-11-17 NOTE — Telephone Encounter (Signed)
Pt lost her instructions for her exercises and is needing a new one. She will be coming back early this afternoon.

## 2014-11-17 NOTE — Assessment & Plan Note (Signed)
Patient given bilateral injections today and tolerated the procedure very well. We discussed icing regimen. We discussed possibility for custom bracing. Patient could be a candidate for viscous supplementation. Has started on formal physical therapy previously. Do not feel that further imaging is necessary at this time. Patient has any instability she'll call sooner. Patient will come back in 3-4 weeks for further evaluation.

## 2014-11-17 NOTE — Progress Notes (Signed)
[facility] [address] [place], [place] [address] Phone: [phone] Subjective:    I'm seeing this patient by the request  of:  Hoyt Koch, MD   CC: left shoulder pain  [id number] Megan Valentine is a 64 y.o. female coming in with complaint of left shoulder pain. Seems to be more on the back. States that it seems to be worse when she does certain activities repetitively. Some of it seems to be sitting for long amount of time. Minimal radiation seems to be going down the left arm. States that sometimes he can catch her breath. Denies any fever, chills, any abnormal weight loss.patient states sometimes when rolling over at night it can give her pain. Not as bad when reaching above her head. Rates the severity of pain is 4 out of 10.  Patient is also complaining of bilateral knee pain. Has been told severe arthritis in the knees bilaterally. Seen probably 2 years ago by another provider and was given steroid injections. Patient would like to avoid any surgical intervention at this time. Patient states that sometimes the knees feel fatigued but wouldn't denies any radiation.paresis severity of pain a 6 out of 10.     Past Medical History  Diagnosis Date  . Environmental allergies   . Arthritis   . GERD (gastroesophageal reflux disease)   . Hiatal hernia   . Mitral valve prolapse   . Hyperlipidemia   . Hypothyroidism   . Rectal prolapse   . Diverticulosis   . History of chicken pox   . Allergy   . Migraines   . IBS (irritable bowel syndrome)   . Chronic bronchitis ([place])   . Cataract    Past Surgical History  Procedure Laterality Date  . Abdominal hysterectomy  1999  . Tubal ligation  1977  . Appendectomy  1962  . Refractive surgery  2000    both eyes   Social History  Substance Use Topics  . Smoking status: Former Smoker    Types: Cigarettes    Quit date: 03/23/1976  . Smokeless tobacco: Never Used  . Alcohol Use: No    Allergies  Allergen Reactions  . Tetracycline Hives and Itching   Family History  Problem Relation Age of Onset  . Stomach cancer Maternal Grandmother   . Hypertension Maternal Grandmother   . Diabetes Maternal Grandmother   . Stroke Maternal Grandmother   . Cancer Maternal Grandmother     Stomach cancer  . Colon cancer Paternal Grandfather   . Heart attack Paternal Grandfather   . Hyperlipidemia Mother   . Diabetes Mother   . Hypertension Mother   . Alzheimer's disease Mother   . Diabetes Father   . Hyperlipidemia Father   . Heart disease Father   . Heart attack Father   . Stroke Paternal Grandmother     Past medical history, social, surgical and family history all reviewed in electronic medical record.   Review of Systems: No headache, visual changes, nausea, vomiting, diarrhea, constipation, dizziness, abdominal pain, skin rash, fevers, chills, night sweats, weight loss, swollen lymph nodes, body aches, joint swelling, muscle aches, chest pain, shortness of breath, mood changes.   Objective Blood pressure 120/84, pulse 77, height 5\' 1"  (1.549 m), weight 185 lb (83.915 kg), SpO2 98 %.  General: No apparent distress alert and oriented x3 mood and affect normal, dressed appropriately.  HEENT: Pupils equal, extraocular movements intact  Respiratory: Patient's speak in full sentences and does not  appear short of breath  Cardiovascular: No lower extremity edema, non tender, no erythema  Skin: Warm dry intact with no signs of infection or rash on extremities or on axial skeleton.  Abdomen: Soft nontender  Neuro: Cranial nerves II through XII are intact, neurovascularly intact in all extremities with 2+ DTRs and 2+ pulses.  Lymph: No lymphadenopathy of posterior or anterior cervical chain or axillae bilaterally.  Gait normal with good balance and coordination.  MSK:  Non tender with full range of motion and good stability and symmetric strength and tone of elbows, wrist, hip,  and ankles bilaterally.  Knee:bilateral Mild valgus deformity bilaterally Tender to palpation of the bilateral knees medial compartment ROM full in flexion and extension and lower leg rotation. Ligaments with solid consistent endpoints including ACL, PCL, LCL, MCL.   painful patellar compression. Patellar glide without crepitus. Patellar and quadriceps tendons unremarkable. Hamstring and quadriceps strength is normal.   Shoulder: left Inspection reveals no abnormalities, atrophy or asymmetry. Palpation is normal with no tenderness over AC joint or bicipital groove. ROM is full in all planes passively. Rotator cuff strength normal throughout. Minimal signs of impingement Speeds and Yergason's tests normal. No labral pathology noted with negative Obrien's, negative clunk and good stability. Flaring of the left scapula noted No painful arc and no drop arm sign. No apprehension sign  MSK US performed of: left This study was ordered, performed, and interpreted by Charlann Boxer D.O.  Shoulder:   Supraspinatus:  Appears normal on long and transverse views, Bursal bulge seen with shoulder abduction on impingement view. Infraspinatus:  Appears normal on long and transverse views. Significant increase in Doppler flow Subscapularis:  Appears normal on long and transverse views. Positive bursa Teres Minor:  Appears normal on long and transverse views. AC joint:  Capsule undistended, no geyser sign. Glenohumeral Joint:  Appears normal without effusion. Glenoid Labrum:  Intact without visualized tears. Biceps Tendon:  Appears normal on long and transverse views, no fraying of tendon, tendon located in intertubercular groove, no subluxation with shoulder internal or external rotation.  Impression: Subacromial bursitis very mild   After informed written and verbal consent, patient was seated on exam table. Right knee was prepped with alcohol swab and utilizing anterolateral approach, patient's  right knee space was injected with 4:1  marcaine 0.5%: Kenalog 40mg /dL. Patient tolerated the procedure well without immediate complications.  After informed written and verbal consent, patient was seated on exam table. Left knee was prepped with alcohol swab and utilizing anterolateral approach, patient's left knee space was injected with 4:1  marcaine 0.5%: Kenalog 40mg /dL. Patient tolerated the procedure well without immediate complications.  Osteopathic findings  T3 extended rotated and side bent left with inhaled third rib T7 extended rotated and side bent left   Impression and Recommendations:     This case required medical decision making of moderate complexity.

## 2014-11-17 NOTE — Progress Notes (Signed)
Pre visit review using our clinic review tool, if applicable. No additional management support is needed unless otherwise documented below in the visit note. 

## 2014-11-17 NOTE — Assessment & Plan Note (Signed)
Patient does have more of a dysfunction. We discussed icing regimen and home exercises. We discussed which activities to do andwhich was potentially avoid.work with Product/process development scientist today. Responded fairly well to osteopathic manipulation. Return in 4 weeks.

## 2014-12-10 ENCOUNTER — Encounter: Payer: Self-pay | Admitting: Cardiology

## 2014-12-22 ENCOUNTER — Telehealth: Payer: Self-pay | Admitting: Internal Medicine

## 2014-12-22 NOTE — Telephone Encounter (Signed)
Patient wanted to let you know that Megan Valentine passed away @ [facility] on 05-24-2022.

## 2014-12-26 ENCOUNTER — Other Ambulatory Visit: Payer: Self-pay | Admitting: Internal Medicine

## 2014-12-29 ENCOUNTER — Ambulatory Visit: Payer: 59 | Admitting: Family Medicine

## 2014-12-30 ENCOUNTER — Other Ambulatory Visit: Payer: Self-pay | Admitting: Internal Medicine

## 2015-01-20 ENCOUNTER — Ambulatory Visit (INDEPENDENT_AMBULATORY_CARE_PROVIDER_SITE_OTHER): Payer: 59 | Admitting: Family Medicine

## 2015-01-20 ENCOUNTER — Encounter: Payer: Self-pay | Admitting: Family Medicine

## 2015-01-20 VITALS — BP 126/82 | HR 78 | Wt 182.0 lb

## 2015-01-20 DIAGNOSIS — M170 Bilateral primary osteoarthritis of knee: Secondary | ICD-10-CM

## 2015-01-20 NOTE — Patient Instructions (Signed)
I can't wait til 2017 happens for you You have had a lot on your plate.  Ice is your friend Tried injecting knees again.  Try to stay active See me when you get the insurance squared away and we can discuss next step of either orthovisc injections or new knees.  Happy New Year!

## 2015-01-20 NOTE — Assessment & Plan Note (Signed)
Given injections in the knees again bilaterally today. Patient declined formal physical therapy. He climbed specialty bracing, the patient will continue with topical anti-inflammatories, medications, icing protocol. We discussed proper shoes. Patient and will come back and see me again in 3-4 weeks. She could be a candidate for viscous supplementation.

## 2015-01-20 NOTE — Progress Notes (Signed)
[facility] [place], [place] [address] Phone: [phone] Subjective:      CC: Bilateral knee pain follow-up  [id number] Megan Valentine is a 64 y.o. female coming in with complaint of l Patient is also complaining of bilateral knee pain. Has been told severe arthritis in the knees bilaterally. Patient has many different difficulties at the moment to have any type or replacements once continue conservative therapy. Was seen greater than 10 weeks ago and was given injections in the knees bilaterally. Was doing significantly better for quite some time but is having worsening symptoms again. Patient states that now may starting to affect her daily activities. Describes it as a dull, throbbing aching sensation. Patient states that no significant instability but unfortunately can keep her reason up at night. Unable to have any surgical intervention at this time. 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 Continues to try to stay active so. Has started the vitamins and hasn't made some mild improvement may be with some of the dull aching pain.     Past Medical History  Diagnosis Date  . Environmental allergies   . Arthritis   . GERD (gastroesophageal reflux disease)   . Hiatal hernia   . Mitral valve prolapse   . Hyperlipidemia   . Hypothyroidism   . Rectal prolapse   . Diverticulosis   . History of chicken pox   . Allergy   . Migraines   . IBS (irritable bowel syndrome)   . Chronic bronchitis ([place])   . Cataract    Past Surgical History  Procedure Laterality Date  . Abdominal hysterectomy  1999  . Tubal ligation  1977  . Appendectomy  1962  . Refractive surgery  2000    both eyes   Social History  Substance Use Topics  . Smoking status: Former Smoker    Types: Cigarettes    Quit date: 03/23/1976  . Smokeless tobacco: Never Used  . Alcohol Use: No   Allergies  Allergen Reactions  . Tetracycline Hives and Itching     Family History  Problem Relation Age of Onset  . Stomach cancer Maternal Grandmother   . Hypertension Maternal Grandmother   . Diabetes Maternal Grandmother   . Stroke Maternal Grandmother   . Cancer Maternal Grandmother     Stomach cancer  . Colon cancer Paternal Grandfather   . Heart attack Paternal Grandfather   . Hyperlipidemia Mother   . Diabetes Mother   . Hypertension Mother   . Alzheimer's disease Mother   . Diabetes Father   . Hyperlipidemia Father   . Heart disease Father   . Heart attack Father   . Stroke Paternal Grandmother     Past medical history, social, surgical and family history all reviewed in electronic medical record.   Review of Systems: No headache, visual changes, nausea, vomiting, diarrhea, constipation, dizziness, abdominal pain, skin rash, fevers, chills, night sweats, weight loss, swollen lymph nodes, body aches, joint swelling, muscle aches, chest pain, shortness of breath, mood changes.   Objective Blood pressure 126/82, pulse 78, weight 182 lb (82.555 kg), SpO2 98 %.  General: No apparent distress alert and oriented x3 mood and affect normal, dressed appropriately.  HEENT: Pupils equal, extraocular movements intact  Respiratory: Patient's speak in full sentences and does not appear short of breath  Cardiovascular: No lower extremity edema, non tender, no erythema  Skin: Warm dry intact with no signs of infection or rash on extremities  or on axial skeleton.  Abdomen: Soft nontender  Neuro: Cranial nerves II through XII are intact, neurovascularly intact in all extremities with 2+ DTRs and 2+ pulses.  Lymph: No lymphadenopathy of posterior or anterior cervical chain or axillae bilaterally.  Gait normal with good balance and coordination.  MSK:  Non tender with full range of motion and good stability and symmetric strength and tone of elbows, wrist, hip, and ankles bilaterally.  Knee:bilateral Mild valgus deformity bilaterally Tender to  palpation of the bilateral knees medial compartment ROM full in flexion and extension and lower leg rotation. Ligaments with solid consistent endpoints including ACL, PCL, LCL, MCL.  painful patellar compression. Patellar glide without crepitus. Patellar and quadriceps tendons unremarkable. Hamstring and quadriceps strength is normal.  No significant change from previous exam  Procedure  After informed written and verbal consent, patient was seated on exam table. Right knee was prepped with alcohol swab and utilizing anterolateral approach, patient's right knee space was injected with 4:1  marcaine 0.5%: Kenalog 40mg /dL. Patient tolerated the procedure well without immediate complications.  After informed written and verbal consent, patient was seated on exam table. Left knee was prepped with alcohol swab and utilizing anterolateral approach, patient's left knee space was injected with 4:1  marcaine 0.5%: Kenalog 40mg /dL. Patient tolerated the procedure well without immediate complications.    Impression and Recommendations:

## 2015-02-06 ENCOUNTER — Other Ambulatory Visit: Payer: Self-pay | Admitting: Internal Medicine

## 2015-02-26 DIAGNOSIS — M1711 Unilateral primary osteoarthritis, right knee: Secondary | ICD-10-CM | POA: Diagnosis not present

## 2015-02-26 DIAGNOSIS — M1712 Unilateral primary osteoarthritis, left knee: Secondary | ICD-10-CM | POA: Diagnosis not present

## 2015-03-03 ENCOUNTER — Telehealth: Payer: Self-pay | Admitting: Internal Medicine

## 2015-03-03 NOTE — Telephone Encounter (Signed)
2.7.17 Pt called after 5 PM regarding RX for levothyroxine. Pt states pharmacy has not heard back from [place]. Please contact pt or pharmacy ASAP. MS

## 2015-03-04 MED ORDER — LEVOTHYROXINE SODIUM 100 MCG PO TABS: 100.0000 ug | ORAL_TABLET | Freq: Every day | ORAL | Status: DC

## 2015-03-04 NOTE — Telephone Encounter (Signed)
Sent in, although we had sent in 3 month supply in January.

## 2015-03-04 NOTE — Telephone Encounter (Signed)
Sent in. Patient aware  

## 2015-03-13 ENCOUNTER — Ambulatory Visit (HOSPITAL_COMMUNITY)
Admission: RE | Admit: 2015-03-13 | Discharge: 2015-03-13 | Disposition: A | Discharge status: Home / Self Care | Payer: PPO | Source: Ambulatory Visit | Attending: Orthopedic Surgery | Admitting: Orthopedic Surgery

## 2015-03-13 ENCOUNTER — Encounter (HOSPITAL_COMMUNITY)
Admission: RE | Admit: 2015-03-13 | Discharge: 2015-03-13 | Disposition: A | Discharge status: Home / Self Care | Payer: PPO | Source: Ambulatory Visit | Attending: Orthopaedic Surgery | Admitting: Orthopaedic Surgery

## 2015-03-13 ENCOUNTER — Encounter (HOSPITAL_COMMUNITY): Payer: Self-pay

## 2015-03-13 DIAGNOSIS — I77819 Aortic ectasia, unspecified site: Secondary | ICD-10-CM | POA: Diagnosis not present

## 2015-03-13 DIAGNOSIS — Z01818 Encounter for other preprocedural examination: Secondary | ICD-10-CM | POA: Insufficient documentation

## 2015-03-13 DIAGNOSIS — Z01810 Encounter for preprocedural cardiovascular examination: Secondary | ICD-10-CM | POA: Diagnosis not present

## 2015-03-13 DIAGNOSIS — Z01812 Encounter for preprocedural laboratory examination: Secondary | ICD-10-CM | POA: Diagnosis not present

## 2015-03-13 HISTORY — DX: Other complications of anesthesia, initial encounter: T88.59XA

## 2015-03-13 HISTORY — DX: Adverse effect of unspecified anesthetic, initial encounter: T41.45XA

## 2015-03-13 LAB — URINALYSIS, ROUTINE W REFLEX MICROSCOPIC
Bilirubin Urine: NEGATIVE
GLUCOSE, UA: NEGATIVE mg/dL
HGB URINE DIPSTICK: NEGATIVE
Ketones, ur: NEGATIVE mg/dL
LEUKOCYTES UA: NEGATIVE
Nitrite: NEGATIVE
PH: 5.5 (ref 5.0–8.0)
Protein, ur: NEGATIVE mg/dL
Specific Gravity, Urine: 1.012 (ref 1.005–1.030)

## 2015-03-13 LAB — SURGICAL PCR SCREEN
MRSA, PCR: NEGATIVE
STAPHYLOCOCCUS AUREUS: NEGATIVE

## 2015-03-13 LAB — CBC WITH DIFFERENTIAL/PLATELET
BASOS PCT: 0 %
Basophils Absolute: 0 10*3/uL (ref 0.0–0.1)
EOS ABS: 0.4 10*3/uL (ref 0.0–0.7)
EOS PCT: 3 %
HCT: 42.4 % (ref 36.0–46.0)
Hemoglobin: 14.4 g/dL (ref 12.0–15.0)
Lymphocytes Relative: 30 %
Lymphs Abs: 3.6 10*3/uL (ref 0.7–4.0)
MCH: 32 pg (ref 26.0–34.0)
MCHC: 34 g/dL (ref 30.0–36.0)
MCV: 94.2 fL (ref 78.0–100.0)
MONO ABS: 1 10*3/uL (ref 0.1–1.0)
Monocytes Relative: 8 %
NEUTROS ABS: 7 10*3/uL (ref 1.7–7.7)
Neutrophils Relative %: 59 %
PLATELETS: 408 10*3/uL — AB (ref 150–400)
RBC: 4.5 MIL/uL (ref 3.87–5.11)
RDW: 13.2 % (ref 11.5–15.5)
WBC: 12 10*3/uL — ABNORMAL HIGH (ref 4.0–10.5)

## 2015-03-13 LAB — APTT: aPTT: 27 seconds (ref 24–37)

## 2015-03-13 LAB — COMPREHENSIVE METABOLIC PANEL
ALK PHOS: 84 U/L (ref 38–126)
ALT: 26 U/L (ref 14–54)
ANION GAP: 13 (ref 5–15)
AST: 25 U/L (ref 15–41)
Albumin: 3.9 g/dL (ref 3.5–5.0)
BUN: 14 mg/dL (ref 6–20)
CALCIUM: 9.7 mg/dL (ref 8.9–10.3)
CHLORIDE: 102 mmol/L (ref 101–111)
CO2: 24 mmol/L (ref 22–32)
CREATININE: 1.14 mg/dL — AB (ref 0.44–1.00)
GFR, EST AFRICAN AMERICAN: 58 mL/min — AB (ref >60)
GFR, EST NON AFRICAN AMERICAN: 50 mL/min — AB (ref >60)
Glucose, Bld: 92 mg/dL (ref 65–99)
Potassium: 3.2 mmol/L — ABNORMAL LOW (ref 3.5–5.1)
SODIUM: 139 mmol/L (ref 135–145)
Total Bilirubin: 0.5 mg/dL (ref 0.3–1.2)
Total Protein: 7.5 g/dL (ref 6.5–8.1)

## 2015-03-13 LAB — TYPE AND SCREEN
ABO/RH(D): O POS
Antibody Screen: NEGATIVE

## 2015-03-13 LAB — ABO/RH: ABO/RH(D): O POS

## 2015-03-13 LAB — PROTIME-INR
INR: 1.02 (ref 0.00–1.49)
PROTHROMBIN TIME: 13.6 s (ref 11.6–15.2)

## 2015-03-13 MED ORDER — CHLORHEXIDINE GLUCONATE 4 % EX LIQD: 60.0000 mL | Freq: Once | CUTANEOUS | Status: DC

## 2015-03-13 MED ORDER — CEFAZOLIN SODIUM-DEXTROSE 2-3 GM-% IV SOLR: 2.0000 g | INTRAVENOUS | Status: DC

## 2015-03-13 MED ORDER — TRANEXAMIC ACID 1000 MG/10ML IV SOLN: 2000.0000 mg | INTRAVENOUS | Status: DC

## 2015-03-13 NOTE — Pre-Procedure Instructions (Signed)
    Megan Valentine  03/13/2015      [facility] - [place], [address] [place] [address] Phone: [phone] Fax: [phone]  [facility] - [place], [place] - [address] AT [address] [place] [address] Phone: [phone] Fax: [phone]    Your procedure is scheduled on 03/24/15.  Report to [facility] Admitting at 530 A.M.  Call this number if you have problems the morning of surgery:  [phone]   Remember:  Do not eat food or drink liquids after midnight.  Take these medicines the morning of surgery with A SIP OF WATER --tylenol,estrace,,synthroid,protonix   Do not wear jewelry, make-up or nail polish.  Do not wear lotions, powders, or perfumes.  You may wear deodorant.  Do not shave 48 hours prior to surgery.  Men may shave face and neck.  Do not bring valuables to the hospital.  [facility] is not responsible for any belongings or valuables.  Contacts, dentures or bridgework may not be worn into surgery.  Leave your suitcase in the car.  After surgery it may be brought to your room.  For patients admitted to the hospital, discharge time will be determined by your treatment team.  Patients discharged the day of surgery will not be allowed to drive home.   Name and phone number of your driver:   Special instructions:    Please read over the following fact sheets that you were given. Pain Booklet, Coughing and Deep Breathing, Blood Transfusion Information, MRSA Information and Surgical Site Infection Prevention

## 2015-03-15 LAB — URINE CULTURE

## 2015-03-18 ENCOUNTER — Telehealth: Payer: Self-pay | Admitting: Internal Medicine

## 2015-03-18 DIAGNOSIS — M1711 Unilateral primary osteoarthritis, right knee: Secondary | ICD-10-CM | POA: Diagnosis not present

## 2015-03-18 NOTE — Telephone Encounter (Signed)
Pt called stated she saw Dr. Garnette Czech for pre-op for knee surgery, pt stated her potassium is 3.2, Creatinine is 1.14, GFR is 50 and she has uti (he gave her some med for uti). Dr. Garnette Czech told her she need to de soemthing about this before she has surgery (which will be 03/24/15). Please call her back

## 2015-03-19 NOTE — H&P (Signed)
CHIEF COMPLAINT:  Painful right knee.   HISTORY OF PRESENT ILLNESS:  Megan Valentine is a very pleasant, 65 year old, white female who is seen today for evaluation of her right knee.  She has had problems with her right knee for some time and actually had undergone an arthroscopic debridement of the right knee on August 22, 2013.  At that time she had a partial medial meniscectomy with chondroplasty of the patella as well as the medial compartment and removal of multiple cartilaginous loose bodies.  She was noted to have grade 4 changes in both the tibial plateau and femoral condyle medially.  Loose bodies measured somewhere between 7 to 8 mm in length and 3 to 4 mm in width that were also removed.  She had been doing well initially, but then started to progress to the point where she was having pain with every step as well as nighttime pain.  She was using 3 Arthritis-Strength Tylenol daily as well as glucosamine and chondroitin and tumeric.  She is having difficulty with performing her activities of daily living and is having symptoms of giving way, which does bother her to the point where she must hold onto cabinets as she walks to help with ambulation.  She did have a fall in October after one of her corticosteroid injections.  She has also had another fall because of the instability.  Her symptoms have worsened to the point now where she has nighttime pain, pain with every step, and giving way symptoms.  She has failed conservative treatment.  Is seen today for evaluation.  PAST MEDICAL HISTORY:  In general, health is fair.   HOSPITALIZATIONS:  In 1962 appendectomy;  1977 tubal ligation; 1999 for total abdominal hysterectomy; 2000 for LASIK to both eyes; 2014 for cataract removal of both eyes; 2014 for YAG laser to the left eye; 2015 for arthroscopy as noted above.    MEDICATIONS:  Levothyroxine 100 mg daily, estradiol 0.5 mg daily, triamterene/hydrochlorothiazide 25 daily for water retention; Bayer aspirin 81 mg  daily, pantoprazole 20 mg daily for acid reflux, meclizine 25 mg 1 tablet 4 times a day as needed for vertigo, MiraLax 2 capfuls daily for chronic constipation, Metamucil 4 tablespoons daily if necessary, Tylenol Arthritis-Strength 650 mg 3 or 4 a day, vitamin D 2000 international units daily, glucosamine 2000 mg daily, B12 at 1000 mcg daily, tumeric 450 mg 2 daily, fish oil 1200 mg daily, and KCl 20 mEq daily.   ALLERGIES:  TETRACYCLINE.   REVIEW OF SYSTEMS:  A 14-point review of systems is unremarkable except for glasses.  She does have occasional dyspnea on exertion.  Previous history of bronchitis and pleurisy, nothing recent.  She does have mitral valve prolapse.  She does have chronic constipation and uses laxatives for this.  She is hypothyroid.  She did have a syncopal episode back in 1997.   FAMILY HISTORY:  Positive for her mother who died at age 66 from Alzheimer's, as not necessarily the cause but she developed at UTI and was unable to acknowledge this to her family and she died of sepsis.  Father died at age 108 from COPD.  She has no true brothers or sisters, only half-brothers and sisters.   SOCIAL HISTORY:  Megan Valentine is a 65 year old, white, married female.  Retired.  She had smoked 1 pack of cigarettes per day for 20 years and then she stopped 40 years ago.   PHYSICAL EXAMINATION:  General:  A 65 year old, white female.  Well developed, well nourished, alert,  pleasant and cooperative, in moderate distress secondary to right knee pain.  Vital signs reveals temperature of 97.  Pulse 88.  Respirations 16.  Blood pressure 120/82.  Height 5 feet 5 inches.  Weight 180 pounds.  BMI 30.  Head is normocephalic.  Eyes:  Pupils equal, round and reactive to light and accommodation with extraocular movements are intact.  Neck was supple.  No bruits were noted.  Chest had good expansion.  Lungs were essentially clear.  Cardiac had a regular rhythm and rate.  Normal S1, S2.  No murmur was elicited.  Pulses  were 1+ bilaterally and symmetric in the lower extremities.  Abdomen was scaphoid, soft and nontender.  No masses palpable.  Normal bowel sounds present.  CNS oriented x3.  Cranial nerves II-XII grossly intact.  Right knee reveals range of motion from 20 degrees to 110 degrees.  She does have crepitus with patellofemoral motion.  Pseudolaxity with valgus stressing but she has a good endpoint.  A 1+ effusion but not warm.  No erythema or ecchymosis.  Good strength in the lower extremities.  Sensation is intact to light touch.   RADIOGRAPHS:  Reveals bone-on-bone medial compartment OA of the right knee.  She does have periarticular spurring medially.  She does have some patellofemoral OA also noted.   CLINICAL IMPRESSION:   1.  End-stage OA of the right knee. 2.  History of hypothyroidism. 3.  History of mitral valve prolapse. 4.  Chronic constipation.   RECOMMENDATIONS:  At this time we feel that she is probably a candidate for a right total knee arthroplasty.  Procedure, risks and benefits were fully explained to her in detail and she is understanding of this.  Appropriate models were used to show the procedure.  All questions were answered in detail and she would like to proceed with this in the very near future.  I have also reviewed today a note from Dr. Pricilla Holm, stating that she feels that [name] is able to proceed with a total knee replacement in regard to a medical and cardiac standpoint.   Mike Craze [place], [place] [phone]  03/19/2015 3:08 PM

## 2015-03-19 NOTE — Telephone Encounter (Signed)
Pt says she takes her potassium every day. She says Dr. Garnette Czech just wanted to be sure you are ok with her levels and if you wanted to do anything about it.  Dr. Garnette Czech also put her on Sulfameth-tmp 800/160 mg tb for a UTI

## 2015-03-19 NOTE — Telephone Encounter (Signed)
Yes, she is still cleared to have the surgery.

## 2015-03-19 NOTE — Telephone Encounter (Signed)
Is she still taking her potassium? I'm not sure what is needed to be done. The creatinine is not far off from her usual.

## 2015-03-19 NOTE — Telephone Encounter (Signed)
Patient aware.

## 2015-03-23 MED ORDER — CEFAZOLIN SODIUM-DEXTROSE 2-3 GM-% IV SOLR
2.0000 g | INTRAVENOUS | Status: AC
  Administered 2015-03-24: 2 g via INTRAVENOUS
  Filled 2015-03-23: qty 50

## 2015-03-23 MED ORDER — ACETAMINOPHEN 10 MG/ML IV SOLN
1000.0000 mg | Freq: Once | INTRAVENOUS | Status: AC
  Administered 2015-03-24: 1000 mg via INTRAVENOUS
  Filled 2015-03-23: qty 100

## 2015-03-23 MED ORDER — SODIUM CHLORIDE 0.9 % IV SOLN: INTRAVENOUS | Status: DC

## 2015-03-23 MED ORDER — TRANEXAMIC ACID 1000 MG/10ML IV SOLN
2000.0000 mg | INTRAVENOUS | Status: AC
  Administered 2015-03-24: 2000 mg via TOPICAL
  Filled 2015-03-23: qty 20

## 2015-03-24 ENCOUNTER — Encounter (HOSPITAL_COMMUNITY): Payer: Self-pay | Admitting: Certified Registered"

## 2015-03-24 ENCOUNTER — Encounter (HOSPITAL_COMMUNITY)
Admission: RE | Disposition: A | Discharge status: Home Health | Payer: Self-pay | Source: Ambulatory Visit | Attending: Orthopaedic Surgery

## 2015-03-24 ENCOUNTER — Inpatient Hospital Stay (HOSPITAL_COMMUNITY): Payer: PPO | Admitting: Certified Registered"

## 2015-03-24 ENCOUNTER — Inpatient Hospital Stay (HOSPITAL_COMMUNITY)
Admission: RE | Admit: 2015-03-24 | Discharge: 2015-03-27 | DRG: 470 | Disposition: A | Discharge status: Home Health | Payer: PPO | Source: Ambulatory Visit | Attending: Orthopaedic Surgery | Admitting: Orthopaedic Surgery

## 2015-03-24 DIAGNOSIS — M1711 Unilateral primary osteoarthritis, right knee: Secondary | ICD-10-CM | POA: Diagnosis not present

## 2015-03-24 DIAGNOSIS — Z79899 Other long term (current) drug therapy: Secondary | ICD-10-CM

## 2015-03-24 DIAGNOSIS — Z9842 Cataract extraction status, left eye: Secondary | ICD-10-CM

## 2015-03-24 DIAGNOSIS — Z9181 History of falling: Secondary | ICD-10-CM

## 2015-03-24 DIAGNOSIS — E669 Obesity, unspecified: Secondary | ICD-10-CM | POA: Diagnosis present

## 2015-03-24 DIAGNOSIS — Z6830 Body mass index (BMI) 30.0-30.9, adult: Secondary | ICD-10-CM | POA: Diagnosis not present

## 2015-03-24 DIAGNOSIS — Z7982 Long term (current) use of aspirin: Secondary | ICD-10-CM | POA: Diagnosis not present

## 2015-03-24 DIAGNOSIS — K5900 Constipation, unspecified: Secondary | ICD-10-CM

## 2015-03-24 DIAGNOSIS — E039 Hypothyroidism, unspecified: Secondary | ICD-10-CM | POA: Diagnosis present

## 2015-03-24 DIAGNOSIS — K5909 Other constipation: Secondary | ICD-10-CM | POA: Diagnosis not present

## 2015-03-24 DIAGNOSIS — G8918 Other acute postprocedural pain: Secondary | ICD-10-CM | POA: Diagnosis not present

## 2015-03-24 DIAGNOSIS — I341 Nonrheumatic mitral (valve) prolapse: Secondary | ICD-10-CM | POA: Diagnosis present

## 2015-03-24 DIAGNOSIS — Z90710 Acquired absence of both cervix and uterus: Secondary | ICD-10-CM

## 2015-03-24 DIAGNOSIS — D62 Acute posthemorrhagic anemia: Secondary | ICD-10-CM | POA: Diagnosis not present

## 2015-03-24 DIAGNOSIS — Z87891 Personal history of nicotine dependence: Secondary | ICD-10-CM | POA: Diagnosis not present

## 2015-03-24 DIAGNOSIS — K567 Ileus, unspecified: Secondary | ICD-10-CM | POA: Diagnosis not present

## 2015-03-24 DIAGNOSIS — K9189 Other postprocedural complications and disorders of digestive system: Secondary | ICD-10-CM

## 2015-03-24 DIAGNOSIS — M179 Osteoarthritis of knee, unspecified: Secondary | ICD-10-CM | POA: Diagnosis not present

## 2015-03-24 DIAGNOSIS — K219 Gastro-esophageal reflux disease without esophagitis: Secondary | ICD-10-CM | POA: Diagnosis not present

## 2015-03-24 DIAGNOSIS — R110 Nausea: Secondary | ICD-10-CM

## 2015-03-24 DIAGNOSIS — Z9841 Cataract extraction status, right eye: Secondary | ICD-10-CM

## 2015-03-24 DIAGNOSIS — Z96659 Presence of unspecified artificial knee joint: Secondary | ICD-10-CM

## 2015-03-24 HISTORY — PX: TOTAL KNEE ARTHROPLASTY: SHX125

## 2015-03-24 SURGERY — ARTHROPLASTY, KNEE, TOTAL
Anesthesia: Spinal | Site: Knee | Laterality: Right

## 2015-03-24 MED ORDER — HYDROMORPHONE HCL 1 MG/ML IJ SOLN
0.5000 mg | INTRAMUSCULAR | Status: DC | PRN
  Administered 2015-03-24 – 2015-03-25 (×2): 1 mg via INTRAVENOUS
  Filled 2015-03-24 (×2): qty 1

## 2015-03-24 MED ORDER — BUPIVACAINE-EPINEPHRINE 0.25% -1:200000 IJ SOLN
INTRAMUSCULAR | Status: DC | PRN
  Administered 2015-03-24: 30 mL

## 2015-03-24 MED ORDER — POLYETHYLENE GLYCOL 3350 17 G PO PACK: 17.0000 g | PACK | Freq: Every day | ORAL | Status: DC | PRN

## 2015-03-24 MED ORDER — LEVOTHYROXINE SODIUM 100 MCG PO TABS
100.0000 ug | ORAL_TABLET | Freq: Every day | ORAL | Status: DC
  Administered 2015-03-25 – 2015-03-27 (×3): 100 ug via ORAL
  Filled 2015-03-24 (×4): qty 1

## 2015-03-24 MED ORDER — BUPIVACAINE-EPINEPHRINE (PF) 0.5% -1:200000 IJ SOLN
INTRAMUSCULAR | Status: DC | PRN
  Administered 2015-03-24: 30 mL via PERINEURAL

## 2015-03-24 MED ORDER — LORATADINE 10 MG PO TABS: 10.0000 mg | ORAL_TABLET | Freq: Every day | ORAL | Status: DC

## 2015-03-24 MED ORDER — EPHEDRINE SULFATE 50 MG/ML IJ SOLN
INTRAMUSCULAR | Status: AC
  Filled 2015-03-24: qty 1

## 2015-03-24 MED ORDER — FLUTICASONE PROPIONATE 50 MCG/ACT NA SUSP: 2.0000 | Freq: Every day | NASAL | Status: DC

## 2015-03-24 MED ORDER — BUPIVACAINE IN DEXTROSE 0.75-8.25 % IT SOLN
INTRATHECAL | Status: DC | PRN
  Administered 2015-03-24: 2 mL via INTRATHECAL

## 2015-03-24 MED ORDER — FENTANYL CITRATE (PF) 100 MCG/2ML IJ SOLN: 25.0000 ug | INTRAMUSCULAR | Status: DC | PRN

## 2015-03-24 MED ORDER — PROPOFOL 10 MG/ML IV BOLUS
INTRAVENOUS | Status: AC
  Filled 2015-03-24: qty 20

## 2015-03-24 MED ORDER — PHENOL 1.4 % MT LIQD: 1.0000 | OROMUCOSAL | Status: DC | PRN

## 2015-03-24 MED ORDER — 0.9 % SODIUM CHLORIDE (POUR BTL) OPTIME
TOPICAL | Status: DC | PRN
  Administered 2015-03-24: 1000 mL

## 2015-03-24 MED ORDER — ONDANSETRON HCL 4 MG PO TABS: 4.0000 mg | ORAL_TABLET | Freq: Four times a day (QID) | ORAL | Status: DC | PRN

## 2015-03-24 MED ORDER — BISACODYL 10 MG RE SUPP
10.0000 mg | Freq: Every day | RECTAL | Status: DC | PRN
  Filled 2015-03-24: qty 1

## 2015-03-24 MED ORDER — ACETAMINOPHEN 10 MG/ML IV SOLN
1000.0000 mg | Freq: Four times a day (QID) | INTRAVENOUS | Status: AC
  Administered 2015-03-24 – 2015-03-25 (×4): 1000 mg via INTRAVENOUS
  Filled 2015-03-24 (×4): qty 100

## 2015-03-24 MED ORDER — ONDANSETRON HCL 4 MG/2ML IJ SOLN
4.0000 mg | Freq: Four times a day (QID) | INTRAMUSCULAR | Status: DC | PRN
  Administered 2015-03-24 – 2015-03-25 (×2): 4 mg via INTRAVENOUS
  Filled 2015-03-24 (×2): qty 2

## 2015-03-24 MED ORDER — LACTATED RINGERS IV SOLN
INTRAVENOUS | Status: DC | PRN
  Administered 2015-03-24 (×2): via INTRAVENOUS

## 2015-03-24 MED ORDER — POTASSIUM CHLORIDE ER 10 MEQ PO TBCR
20.0000 meq | EXTENDED_RELEASE_TABLET | Freq: Every day | ORAL | Status: DC
  Administered 2015-03-24 – 2015-03-27 (×4): 20 meq via ORAL
  Filled 2015-03-24 (×9): qty 2

## 2015-03-24 MED ORDER — ONDANSETRON HCL 4 MG/2ML IJ SOLN: 4.0000 mg | Freq: Once | INTRAMUSCULAR | Status: DC | PRN

## 2015-03-24 MED ORDER — SODIUM CHLORIDE 0.9 % IV SOLN
INTRAVENOUS | Status: DC
  Administered 2015-03-24 – 2015-03-25 (×2): via INTRAVENOUS

## 2015-03-24 MED ORDER — LIDOCAINE HCL (CARDIAC) 20 MG/ML IV SOLN
INTRAVENOUS | Status: AC
  Filled 2015-03-24: qty 5

## 2015-03-24 MED ORDER — SUCCINYLCHOLINE CHLORIDE 20 MG/ML IJ SOLN
INTRAMUSCULAR | Status: AC
  Filled 2015-03-24: qty 1

## 2015-03-24 MED ORDER — ALUM & MAG HYDROXIDE-SIMETH 200-200-20 MG/5ML PO SUSP: 30.0000 mL | ORAL | Status: DC | PRN

## 2015-03-24 MED ORDER — METOCLOPRAMIDE HCL 5 MG/ML IJ SOLN
5.0000 mg | Freq: Three times a day (TID) | INTRAMUSCULAR | Status: DC | PRN
  Administered 2015-03-24 – 2015-03-26 (×3): 10 mg via INTRAVENOUS
  Filled 2015-03-24 (×3): qty 2

## 2015-03-24 MED ORDER — PROPOFOL 500 MG/50ML IV EMUL
INTRAVENOUS | Status: DC | PRN
  Administered 2015-03-24: 100 ug/kg/min via INTRAVENOUS
  Administered 2015-03-24: 125 ug/kg/min via INTRAVENOUS

## 2015-03-24 MED ORDER — SODIUM CHLORIDE 0.9 % IR SOLN
Status: DC | PRN
  Administered 2015-03-24: 1000 mL

## 2015-03-24 MED ORDER — PANTOPRAZOLE SODIUM 20 MG PO TBEC
20.0000 mg | DELAYED_RELEASE_TABLET | Freq: Every day | ORAL | Status: DC
  Administered 2015-03-25 – 2015-03-27 (×3): 20 mg via ORAL
  Filled 2015-03-24 (×3): qty 1

## 2015-03-24 MED ORDER — METHOCARBAMOL 1000 MG/10ML IJ SOLN
500.0000 mg | Freq: Four times a day (QID) | INTRAVENOUS | Status: DC | PRN
  Filled 2015-03-24: qty 5

## 2015-03-24 MED ORDER — METHOCARBAMOL 500 MG PO TABS
500.0000 mg | ORAL_TABLET | Freq: Four times a day (QID) | ORAL | Status: DC | PRN
  Administered 2015-03-24 – 2015-03-26 (×7): 500 mg via ORAL
  Filled 2015-03-24 (×7): qty 1

## 2015-03-24 MED ORDER — DOCUSATE SODIUM 100 MG PO CAPS
100.0000 mg | ORAL_CAPSULE | Freq: Two times a day (BID) | ORAL | Status: DC
  Administered 2015-03-24 – 2015-03-27 (×7): 100 mg via ORAL
  Filled 2015-03-24 (×7): qty 1

## 2015-03-24 MED ORDER — PROPOFOL 10 MG/ML IV BOLUS
INTRAVENOUS | Status: DC | PRN
  Administered 2015-03-24 (×2): 20 mg via INTRAVENOUS

## 2015-03-24 MED ORDER — FENTANYL CITRATE (PF) 250 MCG/5ML IJ SOLN
INTRAMUSCULAR | Status: AC
  Filled 2015-03-24: qty 5

## 2015-03-24 MED ORDER — ALBUTEROL SULFATE 108 (90 BASE) MCG/ACT IN AEPB: 2.0000 | INHALATION_SPRAY | Freq: Four times a day (QID) | RESPIRATORY_TRACT | Status: DC | PRN

## 2015-03-24 MED ORDER — VITAMIN D 1000 UNITS PO TABS
2000.0000 [IU] | ORAL_TABLET | Freq: Every day | ORAL | Status: DC
  Administered 2015-03-25 – 2015-03-27 (×3): 2000 [IU] via ORAL
  Filled 2015-03-24 (×3): qty 2

## 2015-03-24 MED ORDER — EPHEDRINE SULFATE 50 MG/ML IJ SOLN
INTRAMUSCULAR | Status: DC | PRN
  Administered 2015-03-24 (×4): 10 mg via INTRAVENOUS

## 2015-03-24 MED ORDER — CEFAZOLIN SODIUM-DEXTROSE 2-3 GM-% IV SOLR
2.0000 g | Freq: Four times a day (QID) | INTRAVENOUS | Status: AC
  Administered 2015-03-24 (×2): 2 g via INTRAVENOUS
  Filled 2015-03-24 (×2): qty 50

## 2015-03-24 MED ORDER — CHLORHEXIDINE GLUCONATE 4 % EX LIQD: 60.0000 mL | Freq: Once | CUTANEOUS | Status: DC

## 2015-03-24 MED ORDER — RIVAROXABAN 10 MG PO TABS
10.0000 mg | ORAL_TABLET | Freq: Every day | ORAL | Status: DC
  Administered 2015-03-25 – 2015-03-27 (×3): 10 mg via ORAL
  Filled 2015-03-24 (×3): qty 1

## 2015-03-24 MED ORDER — KETOROLAC TROMETHAMINE 15 MG/ML IJ SOLN
7.5000 mg | Freq: Four times a day (QID) | INTRAMUSCULAR | Status: AC
  Administered 2015-03-24: 7.5 mg via INTRAVENOUS
  Filled 2015-03-24 (×2): qty 1

## 2015-03-24 MED ORDER — VITAMIN B-12 1000 MCG PO TABS
2500.0000 ug | ORAL_TABLET | Freq: Every day | ORAL | Status: DC
  Administered 2015-03-25 – 2015-03-27 (×3): 2500 ug via ORAL
  Filled 2015-03-24 (×3): qty 3

## 2015-03-24 MED ORDER — TRIAMTERENE-HCTZ 37.5-25 MG PO TABS
1.0000 | ORAL_TABLET | Freq: Every day | ORAL | Status: DC
  Administered 2015-03-24 – 2015-03-27 (×2): 1 via ORAL
  Filled 2015-03-24 (×6): qty 1

## 2015-03-24 MED ORDER — OXYCODONE HCL 5 MG PO TABS
5.0000 mg | ORAL_TABLET | ORAL | Status: DC | PRN
  Administered 2015-03-24 – 2015-03-27 (×13): 10 mg via ORAL
  Administered 2015-03-27: 5 mg via ORAL
  Administered 2015-03-27: 10 mg via ORAL
  Filled 2015-03-24 (×10): qty 2
  Filled 2015-03-24: qty 1
  Filled 2015-03-24 (×5): qty 2

## 2015-03-24 MED ORDER — MECLIZINE HCL 25 MG PO TABS: 25.0000 mg | ORAL_TABLET | Freq: Every day | ORAL | Status: DC | PRN

## 2015-03-24 MED ORDER — DIPHENHYDRAMINE HCL 12.5 MG/5ML PO ELIX: 12.5000 mg | ORAL_SOLUTION | ORAL | Status: DC | PRN

## 2015-03-24 MED ORDER — METOCLOPRAMIDE HCL 5 MG PO TABS: 5.0000 mg | ORAL_TABLET | Freq: Three times a day (TID) | ORAL | Status: DC | PRN

## 2015-03-24 MED ORDER — MAGNESIUM CITRATE PO SOLN: 1.0000 | Freq: Once | ORAL | Status: DC | PRN

## 2015-03-24 MED ORDER — MIDAZOLAM HCL 5 MG/5ML IJ SOLN
INTRAMUSCULAR | Status: DC | PRN
  Administered 2015-03-24: 2 mg via INTRAVENOUS

## 2015-03-24 MED ORDER — BUPIVACAINE-EPINEPHRINE (PF) 0.25% -1:200000 IJ SOLN
INTRAMUSCULAR | Status: AC
  Filled 2015-03-24: qty 30

## 2015-03-24 MED ORDER — MIDAZOLAM HCL 2 MG/2ML IJ SOLN
INTRAMUSCULAR | Status: AC
  Filled 2015-03-24: qty 2

## 2015-03-24 MED ORDER — MENTHOL 3 MG MT LOZG: 1.0000 | LOZENGE | OROMUCOSAL | Status: DC | PRN

## 2015-03-24 MED ORDER — FENTANYL CITRATE (PF) 100 MCG/2ML IJ SOLN
INTRAMUSCULAR | Status: DC | PRN
  Administered 2015-03-24: 100 ug via INTRAVENOUS

## 2015-03-24 MED ORDER — ESTRADIOL 1 MG PO TABS
0.5000 mg | ORAL_TABLET | Freq: Every day | ORAL | Status: DC
Start: 2015-03-25 — End: 2015-03-27
  Administered 2015-03-25 – 2015-03-27 (×3): 0.5 mg via ORAL
  Filled 2015-03-24 (×3): qty 0.5

## 2015-03-24 SURGICAL SUPPLY — 61 items
BANDAGE ESMARK 6X9 LF (GAUZE/BANDAGES/DRESSINGS) ×1 IMPLANT
BLADE SAGITTAL 25.0X1.19X90 (BLADE) ×2 IMPLANT
BLADE SAGITTAL 25.0X1.19X90MM (BLADE) ×1
BNDG CMPR 9X6 STRL LF SNTH (GAUZE/BANDAGES/DRESSINGS) ×1
BNDG ESMARK 6X9 LF (GAUZE/BANDAGES/DRESSINGS) ×3
BOWL SMART MIX CTS (DISPOSABLE) ×3 IMPLANT
CAP KNEE TOTAL 3 SIGMA ×2 IMPLANT
CEMENT HV SMART SET (Cement) ×6 IMPLANT
COVER SURGICAL LIGHT HANDLE (MISCELLANEOUS) ×3 IMPLANT
CUFF TOURNIQUET SINGLE 34IN LL (TOURNIQUET CUFF) IMPLANT
CUFF TOURNIQUET SINGLE 44IN (TOURNIQUET CUFF) IMPLANT
DRAPE EXTREMITY T 121X128X90 (DRAPE) ×3 IMPLANT
DRAPE PROXIMA HALF (DRAPES) ×3 IMPLANT
DRSG ADAPTIC 3X8 NADH LF (GAUZE/BANDAGES/DRESSINGS) ×3 IMPLANT
DRSG PAD ABDOMINAL 8X10 ST (GAUZE/BANDAGES/DRESSINGS) ×4 IMPLANT
DURAPREP 26ML APPLICATOR (WOUND CARE) ×6 IMPLANT
ELECT CAUTERY BLADE 6.4 (BLADE) ×3 IMPLANT
ELECT REM PT RETURN 9FT ADLT (ELECTROSURGICAL) ×3
ELECTRODE REM PT RTRN 9FT ADLT (ELECTROSURGICAL) ×1 IMPLANT
EVACUATOR 1/8 PVC DRAIN (DRAIN) IMPLANT
FACESHIELD WRAPAROUND (MASK) ×6 IMPLANT
FACESHIELD WRAPAROUND OR TEAM (MASK) ×2 IMPLANT
GAUZE SPONGE 4X4 12PLY STRL (GAUZE/BANDAGES/DRESSINGS) ×3 IMPLANT
GLOVE BIOGEL PI IND STRL 8 (GLOVE) ×1 IMPLANT
GLOVE BIOGEL PI IND STRL 8.5 (GLOVE) ×1 IMPLANT
GLOVE BIOGEL PI INDICATOR 8 (GLOVE) ×2
GLOVE BIOGEL PI INDICATOR 8.5 (GLOVE) ×2
GLOVE ECLIPSE 8.0 STRL XLNG CF (GLOVE) ×6 IMPLANT
GLOVE SURG ORTHO 8.5 STRL (GLOVE) ×6 IMPLANT
GOWN STRL REUS W/ TWL LRG LVL3 (GOWN DISPOSABLE) ×2 IMPLANT
GOWN STRL REUS W/TWL 2XL LVL3 (GOWN DISPOSABLE) ×3 IMPLANT
GOWN STRL REUS W/TWL LRG LVL3 (GOWN DISPOSABLE) ×6
HANDPIECE INTERPULSE COAX TIP (DISPOSABLE) ×3
KIT BASIN OR (CUSTOM PROCEDURE TRAY) ×3 IMPLANT
KIT ROOM TURNOVER OR (KITS) ×3 IMPLANT
MANIFOLD NEPTUNE II (INSTRUMENTS) ×3 IMPLANT
NEEDLE 22X1 1/2 (OR ONLY) (NEEDLE) ×3 IMPLANT
NS IRRIG 1000ML POUR BTL (IV SOLUTION) ×3 IMPLANT
PACK TOTAL JOINT (CUSTOM PROCEDURE TRAY) ×3 IMPLANT
PACK UNIVERSAL I (CUSTOM PROCEDURE TRAY) ×3 IMPLANT
PAD ARMBOARD 7.5X6 YLW CONV (MISCELLANEOUS) ×6 IMPLANT
PAD CAST 4YDX4 CTTN HI CHSV (CAST SUPPLIES) ×1 IMPLANT
PADDING CAST COTTON 4X4 STRL (CAST SUPPLIES) ×3
PADDING CAST COTTON 6X4 STRL (CAST SUPPLIES) ×3 IMPLANT
SET HNDPC FAN SPRY TIP SCT (DISPOSABLE) ×1 IMPLANT
SPONGE LAP 18X18 X RAY DECT (DISPOSABLE) ×2 IMPLANT
STAPLER VISISTAT 35W (STAPLE) ×3 IMPLANT
SUCTION FRAZIER HANDLE 10FR (MISCELLANEOUS) ×2
SUCTION TUBE FRAZIER 10FR DISP (MISCELLANEOUS) ×1 IMPLANT
SURGIFLO W/THROMBIN 8M KIT (HEMOSTASIS) IMPLANT
SUT BONE WAX W31G (SUTURE) ×3 IMPLANT
SUT ETHIBOND NAB CT1 #1 30IN (SUTURE) ×6 IMPLANT
SUT MNCRL AB 3-0 PS2 18 (SUTURE) ×3 IMPLANT
SUT VIC AB 0 CT1 27 (SUTURE) ×6
SUT VIC AB 0 CT1 27XBRD ANBCTR (SUTURE) ×1 IMPLANT
SYR CONTROL 10ML LL (SYRINGE) IMPLANT
TOWEL OR 17X24 6PK STRL BLUE (TOWEL DISPOSABLE) ×3 IMPLANT
TOWEL OR 17X26 10 PK STRL BLUE (TOWEL DISPOSABLE) ×3 IMPLANT
TRAY FOLEY CATH 16FRSI W/METER (SET/KITS/TRAYS/PACK) IMPLANT
WATER STERILE IRR 1000ML POUR (IV SOLUTION) ×3 IMPLANT
WRAP KNEE MAXI GEL POST OP (GAUZE/BANDAGES/DRESSINGS) ×3 IMPLANT

## 2015-03-24 NOTE — Op Note (Signed)
PATIENT ID:      Megan Valentine  MRN:     [id number] DOB/AGE:    09-27-1950 / 65 y.o.       OPERATIVE REPORT    DATE OF PROCEDURE:  03/24/2015       PREOPERATIVE DIAGNOSIS: END STAGE  RIGHT KNEE OSTEOARTHRITIS                                                       Estimated body mass index is 30.09 kg/(m^2) as calculated from the following:   Height as of 03/13/15: 5' 4.5" (1.638 m).   Weight as of this encounter: 80.74 kg (178 lb).     POSTOPERATIVE DIAGNOSIS:   RIGHT KNEE OSTEOARTHRITIS-SAME                                                                     Estimated body mass index is 30.09 kg/(m^2) as calculated from the following:   Height as of 03/13/15: 5' 4.5" (1.638 m).   Weight as of this encounter: 80.74 kg (178 lb).     PROCEDURE:  Procedure(s):RIGHTTOTAL KNEE ARTHROPLASTY      SURGEON:  Joni Fears, MD    ASSISTANT:   Biagio Borg, PA-C   (Present and scrubbed throughout the case, critical for assistance with exposure, retraction, instrumentation, and closure.)          ANESTHESIA: regional and spinal     DRAINS: HEMOVAC DRAIN IN RIGHT KNEE-CLAMPED   TOURNIQUET TIME:  Total Tourniquet Time Documented: Thigh (Right) - 66 minutes Total: Thigh (Right) - 66 minutes     COMPLICATIONS:  None   CONDITION:  stable  PROCEDURE IN DETAIL: [id number]   Doctor Sheahan W 03/24/2015, 9:05 AM

## 2015-03-24 NOTE — Progress Notes (Signed)
Report given to caroline rn as caregiver 

## 2015-03-24 NOTE — Anesthesia Preprocedure Evaluation (Addendum)
Anesthesia Evaluation  Patient identified by MRN, date of birth, ID band Patient awake    Reviewed: Allergy & Precautions, NPO status , Patient's Chart, lab work & pertinent test results  History of Anesthesia Complications (+) PROLONGED EMERGENCE and history of anesthetic complications  Airway Mallampati: II  TM Distance: >3 FB Neck ROM: Full    Dental  (+) Teeth Intact, Dental Advisory Given   Pulmonary former smoker,    Pulmonary exam normal breath sounds clear to auscultation       Cardiovascular (-) angina(-) CAD and (-) Past MI Normal cardiovascular exam+ Valvular Problems/Murmurs MVP  Rhythm:Regular Rate:Normal     Neuro/Psych  Headaches, negative psych ROS   GI/Hepatic Neg liver ROS, hiatal hernia, GERD  Medicated,  Endo/Other  Hypothyroidism Obesity   Renal/GU negative Renal ROS     Musculoskeletal  (+) Arthritis , Osteoarthritis,    Abdominal   Peds  Hematology negative hematology ROS (+) Plt 408k   Anesthesia Other Findings Day of surgery medications reviewed with the patient.  Reproductive/Obstetrics                           Anesthesia Physical Anesthesia Plan  ASA: II  Anesthesia Plan: Spinal   Post-op Pain Management: MAC Combined w/ Regional for Post-op pain   Induction:   Airway Management Planned:   Additional Equipment:   Intra-op Plan:   Post-operative Plan:   Informed Consent: I have reviewed the patients History and Physical, chart, labs and discussed the procedure including the risks, benefits and alternatives for the proposed anesthesia with the patient or authorized representative who has indicated his/her understanding and acceptance.   Dental advisory given  Plan Discussed with: CRNA  Anesthesia Plan Comments: (Discussed risks and benefits of and differences between spinal and general. Discussed risks of spinal including headache, backache,  failure, bleeding, infection, and nerve damage. Patient consents to spinal. Questions answered. Coagulation studies and platelet count acceptable.  Spinal plus Right FNB)       Anesthesia Quick Evaluation

## 2015-03-24 NOTE — Progress Notes (Signed)
Utilization review completed.  

## 2015-03-24 NOTE — Anesthesia Procedure Notes (Addendum)
Procedure Name: MAC Date/Time: 03/24/2015 7:30 AM Performed by: Melina Copa, DAVID R Pre-anesthesia Checklist: Patient identified, Emergency Drugs available, Suction available, Patient being monitored and Timeout performed Patient Re-evaluated:Patient Re-evaluated prior to inductionOxygen Delivery Method: Simple face mask Placement Confirmation: positive ETCO2 Dental Injury: Teeth and Oropharynx as per pre-operative assessment     Spinal Patient location during procedure: OR Start time: 03/24/2015 7:21 AM End time: 03/24/2015 7:22 AM Staffing Anesthesiologist: Catalina Gravel Performed by: anesthesiologist  Preanesthetic Checklist Completed: patient identified, surgical consent, pre-op evaluation, timeout performed, IV checked, risks and benefits discussed and monitors and equipment checked Spinal Block Patient position: sitting Prep: site prepped and draped and DuraPrep Patient monitoring: continuous pulse ox and blood pressure Approach: midline Location: L3-4 Needle Needle type: Pencan  Needle gauge: 24 G Assessment Sensory level: T6 Additional Notes Functioning IV was confirmed and monitors were applied. Sterile prep and drape, including hand hygiene, mask and sterile gloves were used. The patient was positioned and the spine was prepped. The skin was anesthetized with lidocaine.  Free flow of clear CSF was obtained prior to injecting local anesthetic into the CSF.  The spinal needle aspirated freely following injection.  The needle was carefully withdrawn.  The patient tolerated the procedure well. Consent was obtained prior to procedure with all questions answered and concerns addressed. Risks including but not limited to bleeding, infection, nerve damage, paralysis, failed block, inadequate analgesia, allergic reaction, high spinal, itching and headache were discussed and the patient wished to proceed.   Hoy Morn, MD  Anesthesia Regional Block:  Femoral nerve  block  Pre-Anesthetic Checklist: ,, timeout performed, Correct Patient, Correct Site, Correct Laterality, Correct Procedure, Correct Position, site marked, Risks and benefits discussed,  Surgical consent,  Pre-op evaluation,  At surgeon's request and post-op pain management  Laterality: Right  Prep: chloraprep       Needles:  Injection technique: Single-shot  Needle Type: Echogenic Needle     Needle Length: 9cm 9 cm Needle Gauge: 21 and 21 G    Additional Needles:  Procedures: ultrasound guided (picture in chart) Femoral nerve block Narrative:  Start time: 03/24/2015 6:59 AM End time: 03/24/2015 7:00 AM Injection made incrementally with aspirations every 5 mL.  Performed by: Personally  Anesthesiologist: Catalina Gravel  Additional Notes: No pain on injection. No increased resistance to injection. Injection made in 5cc increments.  Good needle visualization.  Patient tolerated procedure well.

## 2015-03-24 NOTE — Progress Notes (Signed)
Orthopedic Tech Progress Note Patient Details:  Megan Valentine Jul 24, 1950 [id number] Ortho visit put on cpm at [facility] [place] Patient ID: Megan Valentine, female   DOB: 1950-03-18, 65 y.o.   MRN: [id number]   Braulio Bosch 03/24/2015, 6:48 PM

## 2015-03-24 NOTE — H&P (Signed)
  The recent History & Physical has been reviewed. I have personally examined the patient today. There is no interval change to the documented History & Physical. The patient would like to proceed with the procedure.  Joni Fears W 03/24/2015,  7:10 AM

## 2015-03-24 NOTE — Progress Notes (Signed)
Orthopedic Tech Progress Note Patient Details:  Megan Valentine 1950/06/24 [id number]  CPM Right Knee CPM Right Knee: On Right Knee Flexion (Degrees): 90 Right Knee Extension (Degrees): 0 Additional Comments: trapeze bar patient helper Viewed order from doctor's order list  Hildred Priest 03/24/2015, 10:12 AM

## 2015-03-24 NOTE — Evaluation (Signed)
Physical Therapy Evaluation Patient Details Name: Megan Valentine MRN: [id number] DOB: [birth year] Today's Date: 03/24/2015   History of Present Illness  [name] on 2/28. In 1962 appendectomy;1977 tubal ligation; 1999 for total abdominal hysterectomy; 2000 for LASIK to both eyes; 2014 for cataract removal of both eyes; 2014 for YAG laser to the left eye; 2015 for arthroscopy as noted above  Clinical Impression  Pt POD#0 limited by nausea and acute surgical pain today. PTA pt was Independent with use of cane for longer mobility. Today pt requiring Min-Mod A for OOB transfers and became hot, dizzy with BP in sitting 96/68. Symptoms resolved with rest and breathing techniques within a minute. Transferred to chair maintaining 50% WBing on R LE with RW. Pt will benefit from continued acute PT services to increase mobility. Recommending D/C with HHPT and supervision/assist from family and friends.    Follow Up Recommendations Home health PT;Supervision for mobility/OOB    Equipment Recommendations  Other (comment) (pt has had equipment delivered)    Recommendations for Other Services       Precautions / Restrictions Precautions Precautions: Fall;Knee Precaution Booklet Issued: Yes (comment) Precaution Comments: reviewed ankle pumps and weight bearing precautions, nothing underneath knee Restrictions Weight Bearing Restrictions: Yes RLE Weight Bearing: Partial weight bearing RLE Partial Weight Bearing Percentage or Pounds: 50      Mobility  Bed Mobility Overal bed mobility: Needs Assistance Bed Mobility: Supine to Sit     Supine to sit: Min assist     General bed mobility comments: minimal assistance to bring LEs to EOB, good use of arms without cueing to scoot to EOB  Transfers Overall transfer level: Needs assistance Equipment used: Rolling walker (2 wheeled) Transfers: Sit to/from [facility] Sit to Stand: Min assist;From elevated surface Stand pivot transfers:  Mod assist       General transfer comment: maximal cueing utilized withbed elevated to achieve standing, 2 attempts due to increased nausea. Max cueing to gain R knee extension and correct posterior lean, minimal advancement of  LE with shuffle steps  Ambulation/Gait                Stairs            Wheelchair Mobility    Modified Rankin (Stroke Patients Only)       Balance Overall balance assessment: Needs assistance Sitting-balance support: No upper extremity supported;Feet supported Sitting balance-Leahy Scale: Good     Standing balance support: Bilateral upper extremity supported;During functional activity Standing balance-Leahy Scale: Poor                               Pertinent Vitals/Pain Pain Assessment: 0-10 Pain Score: 6  Pain Location: R leg Pain Descriptors / Indicators: Aching;Constant;Grimacing;Guarding Pain Intervention(s): Limited activity within patient's tolerance;Monitored during session    Home Living Family/patient expects to be discharged to:: Private residence Living Arrangements: Spouse/significant other Available Help at Discharge: Family;Friend(s);Available 24 hours/day Type of Home: House Home Access: Stairs to enter Entrance Stairs-Rails: None Entrance Stairs-Number of Steps: 1 Home Layout: One level Home Equipment: Walker - 2 wheels Additional Comments: husband home but has mild dementia, still able to offer assistance, many firends available    Prior Function Level of Independence: Independent with assistive device(s)         Comments: used cane for ambulation     Hand Dominance        Extremity/Trunk Assessment   Upper  Extremity Assessment: Overall WFL for tasks assessed           Lower Extremity Assessment: Generalized weakness;RLE deficits/detail RLE Deficits / Details: decreased quad contraction and limited ROM against gravity    Cervical / Trunk Assessment: Normal  Communication    Communication: No difficulties  Cognition Arousal/Alertness: Awake/alert Behavior During Therapy: Anxious Overall Cognitive Status: Within Functional Limits for tasks assessed                      General Comments General comments (skin integrity, edema, etc.): sitting BP 96/68    Exercises Total Joint Exercises Ankle Circles/Pumps: AROM;15 reps;Both;Supine      Assessment/Plan    PT Assessment Patient needs continued PT services  PT Diagnosis Difficulty walking;Acute pain   PT Problem List Decreased strength;Decreased range of motion;Decreased activity tolerance;Decreased balance;Decreased mobility;Decreased knowledge of use of DME;Decreased knowledge of precautions;Pain  PT Treatment Interventions DME instruction;Gait training;Stair training;Therapeutic activities;Functional mobility training;Balance training;Therapeutic exercise;Patient/family education   PT Goals (Current goals can be found in the Care Plan section) Acute Rehab PT Goals Patient Stated Goal: have less nausea PT Goal Formulation: With patient Time For Goal Achievement: 04/05/15 Potential to Achieve Goals: Good    Frequency 7X/week   Barriers to discharge        Co-evaluation               End of Session Equipment Utilized During Treatment: Gait belt Activity Tolerance: Patient limited by pain;Patient limited by lethargy Patient left: in chair;with call bell/phone within reach;with family/visitor present Nurse Communication: Mobility status         Time: [id number] PT Time Calculation (min) (ACUTE ONLY): 26 min   Charges:   PT Evaluation $PT Eval Moderate Complexity: 1 Procedure PT Treatments $Therapeutic Activity: 8-22 mins   PT G Codes:        Ara Kussmaul 26-Mar-2015, 5:40 PM Ara Kussmaul, Student Physical Therapist Acute Rehab [phone]

## 2015-03-24 NOTE — Transfer of Care (Signed)
Immediate Anesthesia Transfer of Care Note  Patient: Megan Valentine  Procedure(s) Performed: Procedure(s): TOTAL KNEE ARTHROPLASTY (Right)  Patient Location: PACU  Anesthesia Type:MAC and Spinal  Level of Consciousness: alert , oriented and patient cooperative  Airway & Oxygen Therapy: Patient Spontanous Breathing  Post-op Assessment: Report given to RN and Post -op Vital signs reviewed and stable  Post vital signs: Reviewed and stable  Last Vitals:  Filed Vitals:   03/24/15 0550  BP: 121/74  Pulse: 71  Temp: 36.7 C  Resp: 20    Complications: No apparent anesthesia complications

## 2015-03-24 NOTE — Op Note (Signed)
Megan Valentine, Megan Valentine NO.:  [id number]  MEDICAL RECORD NO.:  [id number]  LOCATION:  [facility]                         FACILITY:  [facility]  PHYSICIAN:  Vonna Kotyk. Whitfield, M.D.DATE OF BIRTH:  [birth year]  DATE OF PROCEDURE:  03/24/2015 DATE OF DISCHARGE:                              OPERATIVE REPORT   PREOPERATIVE DIAGNOSIS:  End-stage osteoarthritis, right knee.  POSTOPERATIVE DIAGNOSIS:  End-stage osteoarthritis, right knee.  PROCEDURE:  Right total knee replacement.  SURGEON:  Vonna Kotyk. Durward Fortes, M.D.  ASSISTANT:  Aaron Edelman D. Petrarca, P.A.-C was present throughout the operative procedure to ensure its timely completion.  ANESTHESIA:  Spinal with supplemental femoral nerve block.  COMPLICATIONS:  None.  COMPONENTS:  DePuy LCS standard plus femoral component, a #10 polyethylene bearing a rotating #3 tibial tray, a 3- peg metal back rotating patella, all components were secured with polymethyl methacrylate.  HISTORY:  Ms Ridgeway was met in the holding area, identified the right knee as appropriate operative site and marked accordingly.  She did receive a preoperative femoral nerve block per Anesthesia.  The patient was then transported to room #7.  Spinal anesthesia was performed by Anesthesia without difficulty.  The patient was then provided IV sedation.  Nursing staff inserted a Foley catheter.  Urine was clear.  She did have a UTI preoperatively and treated accordingly.  We did obtain a specimen to check efficiency of antibiotics.  Right lower extremity was then placed to thigh tourniquet.  The leg was prepped with chlorhexidine, scrub, and DuraPrep x2 with Esmarch exsanguinated.  Tourniquet was elevated to 350 mmHg.  A time-out was called prior to tourniquet elevation.  A midline longitudinal incision was made centered about the patella extending from the superior pouch to tibial tubercle; and via sharp dissection, incision was carried down to  subcutaneous tissue.  There was a moderate amount of adipose tissue that was incised in midline and medial parapatellar incision was made through the deep capsule.  The joint was entered, there was minimal joint effusion.  The patella was everted at 180 degrees laterally and the knee flexed to 90 degrees.  There was a small amount of synovitis, this was resected. There was significant loss of articular cartilage particularly on the medial compartment.  There was a preoperative varus that was not fixed, I could easily correct the knee to neutral.  There were minimal osteophytes along the medial and lateral femoral condyle and medial tibial plateau, these were removed with a rongeur for sizing purposes. I measured a standard plus femoral component.  First, bony cut was made transversely in the proximal tibia with a 7- degree angle of declination using the external tibial guide with each bony cut on the tibia and the femur.  I checked the external guide to be sure we had appropriate alignment.  Subsequent cuts were then made on the femur using the standard plus femoral jig, used a 4-degree distal femoral valgus cut, finishing guide was applied to pin the taper cuts.  Flexion and extension gaps were perfectly symmetrical at 10 mm.  LCL and MCL remained intact throughout the operative procedure.  I did insert a lamina spreaders along the medial and lateral  compartment to remove the medial and lateral menisci ACL and PCL and remove osteophytes in the posterior femoral condyles.  It was a relatively Engineer, agricultural cyst posteromedially.  The visualized portion of the sac was removed.  Retractors were then placed about the tibia was advanced anteriorly and measured #3 tibial tray, this was pinned in place.  Center hole was made followed by the keeled cut.  With the tibial jig in place, a 10-mm polyethylene bridging bearing was applied followed by the trial standard plus femoral component.  The  joint was reduced through a full range of motion and perfect stability medially and laterally.  A minimal anterior drawer sign full extension and flexion and no malrotation of the tibial tray.  The patella was prepared by removing 10 mm of bone leaving 13 mm of patella thickness.  The patella jig was applied 3 holes made the trials rotating patella was then inserted, reduced; and through a full range of motion, it remained perfectly stable.  The trial components were then removed.  Joint was copiously irrigated with saline solution.  The final components were impacted with polymethyl methacrylate, initially applied the tibial tray followed by the trial 10-mm polyethylene bridging bearing and then the standard plus femoral component.  Each component was impacted and knee was placed in extension, extraneous methacrylate was removed from the periphery of the components.  The patella was applied with methacrylate and a patellar clamp, at approximately 16 minutes of methacrylate had matured.  We checked the joint to see if there is any further extraneous methacrylate.  The trial polyethylene component was removed.  We checked the posterior aspect of the knee, which was clear.  The final 10-mm polyethylene bridging bearing was then inserted.  The deep capsule was then infiltrated with 0.25% Marcaine with epinephrine.  Tourniquet deflated approximately 65 minutes.  We applied tranexamic acid topically to the joint.  There was minimal bleeding and excellent capillary refill.  Any gross bleeders were Bovie coagulated.  Medium-size Hemovac was inserted through the lateral capsule.  The deep capsule was then closed with running #1 Ethibond, superficial capsule with a running 0 Vicryl, subcu with 3-0 Monocryl, skin closed with skin clips.  Sterile bulky dressing was applied followed by the patient's support stocking.  The patient tolerated the procedure without complications.     Vonna Kotyk.  Durward Fortes, M.D.     PWW/MEDQ  D:  03/24/2015  T:  03/24/2015  Job:  [id number]

## 2015-03-25 ENCOUNTER — Encounter (HOSPITAL_COMMUNITY): Payer: Self-pay | Admitting: Orthopaedic Surgery

## 2015-03-25 LAB — BASIC METABOLIC PANEL
Anion gap: 10 (ref 5–15)
BUN: 7 mg/dL (ref 6–20)
CALCIUM: 8.1 mg/dL — AB (ref 8.9–10.3)
CO2: 25 mmol/L (ref 22–32)
CREATININE: 0.79 mg/dL (ref 0.44–1.00)
Chloride: 104 mmol/L (ref 101–111)
Glucose, Bld: 102 mg/dL — ABNORMAL HIGH (ref 65–99)
Potassium: 3.4 mmol/L — ABNORMAL LOW (ref 3.5–5.1)
SODIUM: 139 mmol/L (ref 135–145)

## 2015-03-25 LAB — CBC
HCT: 32 % — ABNORMAL LOW (ref 36.0–46.0)
Hemoglobin: 10 g/dL — ABNORMAL LOW (ref 12.0–15.0)
MCH: 29.8 pg (ref 26.0–34.0)
MCHC: 31.3 g/dL (ref 30.0–36.0)
MCV: 95.2 fL (ref 78.0–100.0)
PLATELETS: 325 10*3/uL (ref 150–400)
RBC: 3.36 MIL/uL — ABNORMAL LOW (ref 3.87–5.11)
RDW: 13.7 % (ref 11.5–15.5)
WBC: 10.4 10*3/uL (ref 4.0–10.5)

## 2015-03-25 MED ORDER — POLYETHYLENE GLYCOL 3350 17 G PO PACK
17.0000 g | PACK | Freq: Two times a day (BID) | ORAL | Status: DC
  Administered 2015-03-25 – 2015-03-27 (×4): 17 g via ORAL
  Filled 2015-03-25 (×4): qty 1

## 2015-03-25 MED ORDER — METOCLOPRAMIDE HCL 5 MG/ML IJ SOLN
10.0000 mg | Freq: Three times a day (TID) | INTRAMUSCULAR | Status: AC
  Administered 2015-03-25 – 2015-03-26 (×3): 10 mg via INTRAVENOUS
  Filled 2015-03-25 (×2): qty 2

## 2015-03-25 MED ORDER — SODIUM CHLORIDE 0.9 % IV SOLN
INTRAVENOUS | Status: DC
  Administered 2015-03-25 (×2): via INTRAVENOUS
  Administered 2015-03-26: 1000 mL via INTRAVENOUS

## 2015-03-25 NOTE — Progress Notes (Addendum)
Physical Therapy Treatment Patient Details Name: Megan Valentine MRN: [id number] DOB: Dec 11, 1950 Today's Date: 03/25/2015    History of Present Illness [name] on 2/28. In 1962 appendectomy;1977 tubal ligation; 1999 for total abdominal hysterectomy; 2000 for LASIK to both eyes; 2014 for cataract removal of both eyes; 2014 for YAG laser to the left eye; 2015 for arthroscopy as noted above    PT Comments    Pt's activity remains limited secondary to low BP/nausea/dizziness.  Pt unable to progress gait distance which limits progress.  Pt able to stand edge of bed x 2 min.    Follow Up Recommendations  Home health PT;Supervision for mobility/OOB     Equipment Recommendations  None recommended by PT    Recommendations for Other Services       Precautions / Restrictions Precautions Precautions: Fall;Knee Precaution Booklet Issued: Yes (comment) Precaution Comments: knee handout given Restrictions Weight Bearing Restrictions: Yes RLE Weight Bearing: Partial weight bearing RLE Partial Weight Bearing Percentage or Pounds: 50 %    Mobility  Bed Mobility Overal bed mobility: Needs Assistance Bed Mobility: Sit to Supine     Supine to sit: Supervision Sit to supine: Supervision   General bed mobility comments: Pt remains to use bed rails to return to supine position.  Pt scooted in bed with +2 total assist secondary to dizziness and nause.  Pt able to scoot without assistance.  Assistance provided for comfort.    Transfers Overall transfer level: Needs assistance Equipment used: Rolling walker (2 wheeled) Transfers: Sit to/from Stand Sit to Stand: Min guard Stand pivot transfers: Min guard       General transfer comment: Pt able to stand edge of bed x 2 min before complaints of dizziness and nausea worsened and pt required return to seated position.  Pt able to maintain PWB edge of bed.  Unable to advance gait distance secondary to dizziness.    Ambulation/Gait Ambulation/Gait  assistance:  (unable to advance gait due to Low BP/dizziness/nausea.) Ambulation Distance (Feet): 15 Feet Assistive device: Rolling walker (2 wheeled) Gait Pattern/deviations: Step-to pattern;Antalgic     General Gait Details: Verbal cues for correct LE sequencing, breathe during gait.  Pt reports feeling lightheaded throuhout, so much so that I brought the recliner into the bathroom so she would not have to walk back to the chair.  "I feel like I am going to pass out".  BPs are still soft.     Stairs            Wheelchair Mobility    Modified Rankin (Stroke Patients Only)       Balance Overall balance assessment: Needs assistance Sitting-balance support: Feet supported;No upper extremity supported Sitting balance-Leahy Scale: Good     Standing balance support: Bilateral upper extremity supported Standing balance-Leahy Scale: Fair                      Cognition Arousal/Alertness: Awake/alert Behavior During Therapy: WFL for tasks assessed/performed Overall Cognitive Status: Within Functional Limits for tasks assessed                      Exercises Total Joint Exercises Ankle Circles/Pumps: AROM;Supine;20 reps Quad Sets: AROM;Right;10 reps;Supine Gluteal Sets: AROM;Both;10 reps;Supine Towel Squeeze: AROM;Both;10 reps;Seated Heel Slides: AROM;Both;Right;Supine Hip ABduction/ADduction: AROM;Right;10 reps;Supine Straight Leg Raises: AROM;Right;10 reps;Supine    General Comments        Pertinent Vitals/Pain Pain Assessment: 0-10 Pain Score: 6  Pain Location: R knee Pain Descriptors /  Indicators: Aching;Burning Pain Intervention(s): Limited activity within patient's tolerance;Monitored during session;Repositioned    Home Living Family/patient expects to be discharged to:: Private residence Living Arrangements: Spouse/significant other Available Help at Discharge: Family;Friend(s);Available 24 hours/day Type of Home: House Home Access: Stairs  to enter Entrance Stairs-Rails: None Home Layout: One level Home Equipment: Environmental consultant - 2 wheels;Toilet riser Additional Comments: husband home but has mild dementia, still able to offer assistance, many firends available    Prior Function Level of Independence: Independent with assistive device(s)      Comments: used cane for ambulation   PT Goals (current goals can now be found in the care plan section) Acute Rehab PT Goals Patient Stated Goal: to go home at d/c Potential to Achieve Goals: Good Progress towards PT goals: Progressing toward goals    Frequency  7X/week    PT Plan Current plan remains appropriate    Co-evaluation             End of Session Equipment Utilized During Treatment: Gait belt Activity Tolerance: Patient limited by fatigue Patient left: in chair;with call bell/phone within reach;with family/visitor present     Time: 1350-1414 PT Time Calculation (min) (ACUTE ONLY): 24 min  Charges:  $Gait Training: 8-22 mins $Therapeutic Exercise: 8-22 mins $Therapeutic Activity: 8-22 mins                    G Codes:      Cristela Blue 04-19-15, 2:31 PM  Governor Rooks, PTA pager [phone] Gait not performed in pm session charges are accurate in doc flow sheets.  Previous tx pulled in extra information from am session.   Governor Rooks, PTA pager [phone]

## 2015-03-25 NOTE — Progress Notes (Addendum)
Physical Therapy Treatment Patient Details Name: Megan Valentine MRN: [id number] DOB: [birth year] Today's Date: 03/25/2015    History of Present Illness [name] on 2/28. In 1962 appendectomy;1977 tubal ligation; 1999 for total abdominal hysterectomy; 2000 for LASIK to both eyes; 2014 for cataract removal of both eyes; 2014 for YAG laser to the left eye; 2015 for arthroscopy as noted above    PT Comments    Pt is very limited today by lightheadedness in sitting and in standing.  We were able to make it into the bathroom with RW, but not back out because pt felt as though she was going to pass out.  It would be safer to progress gait with chair to follow this PM.  PT will continue to follow acutely.   Follow Up Recommendations  Home health PT;Supervision for mobility/OOB     Equipment Recommendations  None recommended by PT    Recommendations for Other Services    NA    Precautions / Restrictions Precautions Precautions: Fall;Knee Precaution Booklet Issued: Yes (comment) Precaution Comments: knee handout given Restrictions Weight Bearing Restrictions: Yes RLE Weight Bearing: Partial weight bearing RLE Partial Weight Bearing Percentage or Pounds: 50    Mobility  Bed Mobility Overal bed mobility: Needs Assistance Bed Mobility: Supine to Sit     Supine to sit: Min assist     General bed mobility comments: Min assist to help progress right leg over EOB.   Transfers Overall transfer level: Needs assistance Equipment used: Rolling walker (2 wheeled) Transfers: Sit to/from [facility] Sit to Stand: Min assist Stand pivot transfers: Min assist       General transfer comment: Min assist to support trunk for balance during transitions. Sat EOB for a minute to see if lightheadedness would dissipate, did the same in standing.  Lightheadedness persisted. In sitting, reinforced verbal cues for PWB 50% prior to standing up.  Ambulation/Gait Ambulation/Gait  assistance: Min assist Ambulation Distance (Feet): 15 Feet Assistive device: Rolling walker (2 wheeled) Gait Pattern/deviations: Step-to pattern;Antalgic     General Gait Details: Verbal cues for correct LE sequencing, breathe during gait.  Pt reports feeling lightheaded throuhout, so much so that I brought the recliner into the bathroom so she would not have to walk back to the chair.  "I feel like I am going to pass out".  BPs are still soft.            Balance Overall balance assessment: Needs assistance Sitting-balance support: Feet supported;No upper extremity supported Sitting balance-Leahy Scale: Good     Standing balance support: Bilateral upper extremity supported Standing balance-Leahy Scale: Poor                      Cognition Arousal/Alertness: Awake/alert Behavior During Therapy: WFL for tasks assessed/performed Overall Cognitive Status: Within Functional Limits for tasks assessed                      Exercises Total Joint Exercises Ankle Circles/Pumps: AROM;15 reps;Both;Supine Quad Sets: AROM;Right;10 reps;Seated Towel Squeeze: AROM;Both;10 reps;Seated Heel Slides: AAROM;Right;10 reps;Seated        Pertinent Vitals/Pain Pain Assessment: 0-10 Pain Score: 7  Pain Location: right knee Pain Descriptors / Indicators: Aching;Burning Pain Intervention(s): Limited activity within patient's tolerance;Monitored during session;Repositioned     03/25/15 1111  Vital Signs  Pulse Rate 74  BP 100/61 mmHg  BP Location Left Arm  BP Method Automatic  Patient Position (if appropriate) Sitting (reclined in recliner chair)  Oxygen Therapy  SpO2 97 %  O2 Device Room Air   Home Living Family/patient expects to be discharged to:: Private residence Living Arrangements: Spouse/significant other Available Help at Discharge: Family;Friend(s);Available 24 hours/day Type of Home: House Home Access: Stairs to enter Entrance Stairs-Rails: None Home  Layout: One level Home Equipment: Environmental consultant - 2 wheels          PT Goals (current goals can now be found in the care plan section) Acute Rehab PT Goals Patient Stated Goal: to go home at d/c Progress towards PT goals: Progressing toward goals    Frequency  7X/week    PT Plan Current plan remains appropriate       End of Session   Activity Tolerance: Patient limited by fatigue Patient left: in chair;with call bell/phone within reach;with family/visitor present     Time: [id number] PT Time Calculation (min) (ACUTE ONLY): 35 min  Charges:  $Gait Training: 8-22 mins $Therapeutic Exercise: 8-22 mins                      Alexcia Schools B. [place], [place], DPT [phone]   03/25/2015, 12:54 PM

## 2015-03-25 NOTE — Anesthesia Postprocedure Evaluation (Signed)
Anesthesia Post Note  Patient: Megan Valentine  Procedure(s) Performed: Procedure(s) (LRB): TOTAL KNEE ARTHROPLASTY (Right)  Patient location during evaluation: PACU Anesthesia Type: Spinal and Regional Level of consciousness: oriented, awake and alert, awake and patient cooperative Pain management: pain level controlled Vital Signs Assessment: post-procedure vital signs reviewed and stable Respiratory status: spontaneous breathing, respiratory function stable and patient connected to nasal cannula oxygen Cardiovascular status: blood pressure returned to baseline and stable Postop Assessment: no headache, no backache, spinal receding, patient able to bend at knees, no signs of nausea or vomiting and adequate PO intake Anesthetic complications: no    Last Vitals:  Filed Vitals:   03/24/15 2200 03/25/15 0430  BP: 82/50 96/49  Pulse: 88 83  Temp: 36.8 C 36.7 C  Resp: 16 16    Last Pain:  Filed Vitals:   03/25/15 [id number]  PainSc: 3                  Catalina Gravel

## 2015-03-25 NOTE — Progress Notes (Signed)
Orthopedic Tech Progress Note Patient Details:  Megan Valentine 1950/03/18 [id number]  Patient ID: Megan Valentine, female   DOB: 02-Mar-1950, 65 y.o.   MRN: [id number] Applied cpm 0-50  Karolee Stamps 03/25/2015, 7:17 PM

## 2015-03-25 NOTE — Care Management Note (Signed)
Case Management Note  Patient Details  Name: Megan Valentine MRN: [id number] Date of Birth: 1950-11-20  Subjective/Objective:           S/p right total knee arthroplasty         Action/Plan: Set up with [facility] for HHPT by MD office. Spoke with patient, no change in discharge plan. Patient stated that her husband will be at home with her but he is not able to assist her much due to medical issues. She stated that her cousin and a friend will be assisting her after discharge. [employer] delivered CPM and rolling walker to home, she already has 3N1. Contacted Shamieka Gullo with [facility], set up aide visits.     Expected Discharge Date:                  Expected Discharge Plan:  [place]  In-House Referral:  NA  Discharge planning Services  CM Consult  Post Acute Care Choice:  Durable Medical Equipment, Home Health Choice offered to:  Patient  DME Arranged:  CPM, Walker rolling DME Agency:  [employer]  HH Arranged:  PT, Nurse's Aide HH Agency:  [facility]  Status of Service:  Completed, signed off  Medicare Important Message Given:    Date Medicare IM Given:    Medicare IM give by:    Date Additional Medicare IM Given:    Additional Medicare Important Message give by:     If discussed at [place] of Stay Meetings, dates discussed:    Additional Comments:  Nila Nephew, RN 03/25/2015, 2:39 PM

## 2015-03-25 NOTE — Evaluation (Signed)
Occupational Therapy Evaluation Patient Details Name: Megan Valentine MRN: [id number] DOB: 11-15-1950 Today's Date: 03/25/2015    History of Present Illness [name] on 2/28. In 1962 appendectomy;1977 tubal ligation; 1999 for total abdominal hysterectomy; 2000 for LASIK to both eyes; 2014 for cataract removal of both eyes; 2014 for YAG laser to the left eye; 2015 for arthroscopy as noted above   Clinical Impression   Patient is s/p [name] surgery resulting in functional limitations due to the deficits listed below (see OT problem list). Session greatly limited by nausea and BP 90's / 50s with symptoms. RN in room to help address nausea with medication.  Patient will benefit from skilled OT acutely to increase independence and safety with ADLS to allow discharge home without follow. Pt should progress quickly once nausea and pain better controlled based on this assessment.     Follow Up Recommendations  No OT follow up    Equipment Recommendations  None recommended by OT (has 3n1 and toilet riser)    Recommendations for Other Services       Precautions / Restrictions Precautions Precautions: Fall;Knee Precaution Booklet Issued: Yes (comment) Precaution Comments: knee handout given Restrictions Weight Bearing Restrictions: Yes RLE Weight Bearing: Partial weight bearing RLE Partial Weight Bearing Percentage or Pounds: 50      Mobility Bed Mobility Overal bed mobility: Needs Assistance Bed Mobility: Sit to Supine     Supine to sit: Min assist Sit to supine: Supervision   General bed mobility comments: Pt able to use bed rails to pull herself to the [facility] supervision level . pt able to bridge with L LE  Transfers Overall transfer level: Needs assistance Equipment used: Rolling walker (2 wheeled) Transfers: Sit to/from [facility] Sit to Stand: Min assist Stand pivot transfers: Min assist       General transfer comment: observed with [employer] helping to  EOB    Balance Overall balance assessment: Needs assistance Sitting-balance support: Feet supported;No upper extremity supported Sitting balance-Leahy Scale: Good     Standing balance support: Bilateral upper extremity supported Standing balance-Leahy Scale: Fair                              ADL Overall ADL's : Needs assistance/impaired     Grooming: Wash/dry face;Modified independent               Lower Body Dressing: Bed level;Minimal assistance Lower Body Dressing Details (indicate cue type and reason): pt able to reach L LE and able to reach R ankle long sitting. Pt should be able to dress in supine by dressing R LE first and then pulling up in supine Toilet Transfer: Minimal assistance;Stand-pivot;RW Toilet Transfer Details (indicate cue type and reason): chair to bed observed with tech Sunday Spillers           General ADL Comments: pt nauseate with transfer and BP obtained to be 97/56 with 8 out 10 pain. Assessment kept to bed level due to nausea. RN Beth called to room to help problem solve possible medications to help with nausea and pain. pt requesting iv medication and educated on decr BP and IV medications could continue to reduce BP. Family present during session. Pt should progress quickly with pain reduction     Vision     Perception     Praxis      Pertinent Vitals/Pain Pain Assessment: 0-10 Pain Score: 7  Pain Location: right knee Pain  Descriptors / Indicators: Aching;Burning Pain Intervention(s): Limited activity within patient's tolerance;Monitored during session;Repositioned     Hand Dominance Right   Extremity/Trunk Assessment Upper Extremity Assessment Upper Extremity Assessment: Overall WFL for tasks assessed   Lower Extremity Assessment Lower Extremity Assessment: Defer to PT evaluation   Cervical / Trunk Assessment Cervical / Trunk Assessment: Normal   Communication Communication Communication: No difficulties   Cognition  Arousal/Alertness: Awake/alert Behavior During Therapy: WFL for tasks assessed/performed Overall Cognitive Status: Within Functional Limits for tasks assessed                     General Comments       Exercises Exercises: Total Joint     Shoulder Instructions      Home Living Family/patient expects to be discharged to:: Private residence Living Arrangements: Spouse/significant other Available Help at Discharge: Family;Friend(s);Available 24 hours/day Type of Home: House Home Access: Stairs to enter [employer] of Steps: 1 Entrance Stairs-Rails: None Home Layout: One level     Bathroom Shower/Tub: Occupational psychologist: Standard     Home Equipment: Environmental consultant - 2 wheels;Toilet riser   Additional Comments: husband home but has mild dementia, still able to offer assistance, many firends available      Prior Functioning/Environment Level of Independence: Independent with assistive device(s)        Comments: used cane for ambulation    OT Diagnosis: Generalized weakness;Acute pain   OT Problem List: Decreased strength;Decreased activity tolerance;Impaired balance (sitting and/or standing);Decreased safety awareness;Decreased knowledge of use of DME or AE;Decreased knowledge of precautions;Cardiopulmonary status limiting activity;Pain   OT Treatment/Interventions: Self-care/ADL training;Therapeutic exercise;DME and/or AE instruction;Therapeutic activities;Patient/family education;Balance training    OT Goals(Current goals can be found in the care plan section) Acute Rehab OT Goals Patient Stated Goal: to go home at d/c OT Goal Formulation: With patient/family Time For Goal Achievement: 04/08/15 Potential to Achieve Goals: Good ADL Goals Pt Will Transfer to Toilet: with supervision;bedside commode;ambulating Pt Will Perform Tub/Shower Transfer: Shower transfer;ambulating;rolling walker;3 in 1  OT Frequency: Min 2X/week   Barriers to D/C:             Co-evaluation              End of Session Equipment Utilized During Treatment: Rolling walker CPM Right Knee CPM Right Knee: Off Nurse Communication: Mobility status;Precautions  Activity Tolerance: Other (comment) (nausea and vomitting) Patient left: in bed;with call bell/phone within reach;with nursing/sitter in room   Time: 1204-1217 OT Time Calculation (min): 13 min Charges:  OT General Charges $OT Visit: 1 Procedure OT Evaluation $OT Eval Moderate Complexity: 1 Procedure G-Codes:    Peri Maris 2015/04/21, 1:22 PM    Jeri Modena   OTR/L Pager: [phone] Office: [phone] .

## 2015-03-25 NOTE — Progress Notes (Signed)
Patient ID: Megan Valentine, female   DOB: [birth year], 65 y.o.   MRN: [id number] PATIENT ID: Megan Valentine        MRN:  [id number]          DOB/AGE: [birth year] / 65 y.o.    Joni Fears, MD   Biagio Borg, PA-C [address] [place], [place]  [address]                             [phone]   PROGRESS NOTE  Subjective:  negative for Chest Pain  negative for Shortness of Breath  positive for Nausea/Vomiting yesterday...none today  negative for Calf Pain    Tolerating Diet: yes         Patient reports pain as mild and moderate.       Objective: Vital signs in last 24 hours:   Patient Vitals for the past 24 hrs:  BP Temp Temp src Pulse Resp SpO2  03/25/15 0430 (!) 96/49 mmHg 98 F (36.7 C) Oral 83 16 99 %  03/24/15 2200 (!) 82/50 mmHg 98.2 F (36.8 C) Oral 88 16 95 %  03/24/15 1048 100/62 mmHg 97.6 F (36.4 C) - 87 16 99 %  03/24/15 1030 95/65 mmHg - - 91 12 100 %  03/24/15 1015 (!) 98/58 mmHg - - 90 (!) 24 97 %  03/24/15 1000 (!) 95/55 mmHg - - 90 19 96 %  03/24/15 0945 93/63 mmHg - - 94 19 95 %  03/24/15 0930 (!) 100/53 mmHg 97.7 F (36.5 C) - 98 15 96 %      Intake/Output from previous day:   02/28 0701 - 03/01 0700 In: 2550 [I.V.:2350] Out: 1750 [Urine:1500; Drains:200]   Intake/Output this shift:       Intake/Output      02/28 0701 - 03/01 0700 03/01 0701 - 03/02 0700   I.V. (mL/kg) 2350 (29.1)    IV Piggyback 200    Total Intake(mL/kg) 2550 (31.6)    Urine (mL/kg/hr) 1500 (0.8)    Emesis/NG output 0 (0)    Drains 200 (0.1)    Blood 50 (0)    Total Output 1750     Net +800          Emesis Occurrence 1 x       LABORATORY DATA:  Recent Labs  03/25/15 0445  WBC 10.4  HGB 10.0*  HCT 32.0*  PLT 325    Recent Labs  03/25/15 0445  NA 139  K 3.4*  CL 104  CO2 25  BUN 7  CREATININE 0.79  GLUCOSE 102*  CALCIUM 8.1*   Lab Results  Component Value Date   INR 1.02 03/13/2015    Recent Radiographic Studies :  Dg Chest 2  View  03/13/2015  CLINICAL DATA:  Preoperative respiratory exam for right knee replacement. EXAM: CHEST  2 VIEW COMPARISON:  02/26/2012 FINDINGS: Heart size is normal. Mild ectasia of the aorta. The lungs are clear. The vascularity is normal. No effusions. No significant bone finding. IMPRESSION: No active disease.  Slight ectasia of the aorta. Electronically Signed   By: Nelson Chimes M.D.   On: 03/13/2015 15:46     Examination:  General appearance: alert, cooperative, mild distress and moderate distress Resp: clear to auscultation bilaterally Cardio: regular rate and rhythm GI: normal findings: bowel sounds normal  Wound Exam: clean, dry, intact dressing  Drainage:  None: wound tissue dry  Motor Exam: EHL,  FHL, Anterior Tibial and Posterior Tibial Intact  Sensory Exam: Superficial Peroneal, Deep Peroneal and Tibial normal  Vascular Exam: Right dorsalis pedis artery has trace pulse  Assessment:    1 Day Post-Op  Procedure(s) (LRB): TOTAL KNEE ARTHROPLASTY (Right)  ADDITIONAL DIAGNOSIS:  Principal Problem:   Osteoarthritis of right knee Active Problems:   S/P total knee replacement using cement     Plan: Physical Therapy as ordered Partial Weight Bearing @ 50% (PWB)  DVT Prophylaxis:  Xarelto, Foot Pumps and TED hose  DISCHARGE PLAN: Home hopefully tomorrow  DISCHARGE NEEDS: HHPT, CPM, Walker and 3-in-1 comode seat   Hemovac pulled        [facility]  03/25/2015 8:06 AM

## 2015-03-25 NOTE — Discharge Instructions (Signed)
Information on my medicine - XARELTO® (Rivaroxaban) ° °This medication education was reviewed with me or my healthcare representative as part of my discharge preparation.  The pharmacist that spoke with me during my hospital stay was:  Lolly Glaus Kay, RPH ° °Why was Xarelto® prescribed for you? °Xarelto® was prescribed for you to reduce the risk of blood clots forming after orthopedic surgery. The medical term for these abnormal blood clots is venous thromboembolism (VTE). ° °What do you need to know about xarelto® ? °Take your Xarelto® ONCE DAILY at the same time every day. °You may take it either with or without food. ° °If you have difficulty swallowing the tablet whole, you may crush it and mix in applesauce just prior to taking your dose. ° °Take Xarelto® exactly as prescribed by your doctor and DO NOT stop taking Xarelto® without talking to the doctor who prescribed the medication.  Stopping without other VTE prevention medication to take the place of Xarelto® may increase your risk of developing a clot. ° °After discharge, you should have regular check-up appointments with your healthcare provider that is prescribing your Xarelto®.   ° °What do you do if you miss a dose? °If you miss a dose, take it as soon as you remember on the same day then continue your regularly scheduled once daily regimen the next day. Do not take two doses of Xarelto® on the same day.  ° °Important Safety Information °A possible side effect of Xarelto® is bleeding. You should call your healthcare provider right away if you experience any of the following: °? Bleeding from an injury or your nose that does not stop. °? Unusual colored urine (red or dark brown) or unusual colored stools (red or black). °? Unusual bruising for unknown reasons. °? A serious fall or if you hit your head (even if there is no bleeding). ° °Some medicines may interact with Xarelto® and might increase your risk of bleeding while on Xarelto®. To help avoid  this, consult your healthcare provider or pharmacist prior to using any new prescription or non-prescription medications, including herbals, vitamins, non-steroidal anti-inflammatory drugs (NSAIDs) and supplements. ° °This website has more information on Xarelto®: www.xarelto.com. ° ° °

## 2015-03-25 NOTE — Progress Notes (Signed)
Rept to [employer] PA. Pt continues to have nausea with vomiting. Pt BP is low 90-100's over 50-60's. Maxzide 37.5-25 mg held this AM due to low BP. Per his order, will restart IV fluid NS at 75cc/hr and will schedule IV Reglan every 8 hours for 24 hours. Will continue to monitor.

## 2015-03-26 ENCOUNTER — Inpatient Hospital Stay (HOSPITAL_COMMUNITY): Payer: PPO

## 2015-03-26 DIAGNOSIS — K5900 Constipation, unspecified: Secondary | ICD-10-CM

## 2015-03-26 DIAGNOSIS — Z96651 Presence of right artificial knee joint: Secondary | ICD-10-CM | POA: Diagnosis not present

## 2015-03-26 DIAGNOSIS — K9189 Other postprocedural complications and disorders of digestive system: Secondary | ICD-10-CM

## 2015-03-26 DIAGNOSIS — K567 Ileus, unspecified: Secondary | ICD-10-CM

## 2015-03-26 DIAGNOSIS — M1711 Unilateral primary osteoarthritis, right knee: Secondary | ICD-10-CM | POA: Diagnosis not present

## 2015-03-26 LAB — BASIC METABOLIC PANEL
ANION GAP: 9 (ref 5–15)
BUN: 5 mg/dL — ABNORMAL LOW (ref 6–20)
CALCIUM: 8 mg/dL — AB (ref 8.9–10.3)
CO2: 23 mmol/L (ref 22–32)
Chloride: 109 mmol/L (ref 101–111)
Creatinine, Ser: 0.69 mg/dL (ref 0.44–1.00)
GLUCOSE: 107 mg/dL — AB (ref 65–99)
POTASSIUM: 3.4 mmol/L — AB (ref 3.5–5.1)
Sodium: 141 mmol/L (ref 135–145)

## 2015-03-26 LAB — CBC
HEMATOCRIT: 31.1 % — AB (ref 36.0–46.0)
Hemoglobin: 9.9 g/dL — ABNORMAL LOW (ref 12.0–15.0)
MCH: 30.9 pg (ref 26.0–34.0)
MCHC: 31.8 g/dL (ref 30.0–36.0)
MCV: 97.2 fL (ref 78.0–100.0)
PLATELETS: 306 10*3/uL (ref 150–400)
RBC: 3.2 MIL/uL — AB (ref 3.87–5.11)
RDW: 14.1 % (ref 11.5–15.5)
WBC: 12.2 10*3/uL — AB (ref 4.0–10.5)

## 2015-03-26 LAB — URINE CULTURE: Culture: 8000

## 2015-03-26 MED ORDER — RIVAROXABAN 10 MG PO TABS: 10.0000 mg | ORAL_TABLET | Freq: Every day | ORAL | Status: DC

## 2015-03-26 MED ORDER — METOCLOPRAMIDE HCL 5 MG/ML IJ SOLN
10.0000 mg | Freq: Four times a day (QID) | INTRAMUSCULAR | Status: AC
  Administered 2015-03-26 – 2015-03-27 (×4): 10 mg via INTRAVENOUS
  Filled 2015-03-26 (×3): qty 2

## 2015-03-26 MED ORDER — FLEET ENEMA 7-19 GM/118ML RE ENEM
1.0000 | ENEMA | Freq: Once | RECTAL | Status: AC
  Administered 2015-03-26: 1 via RECTAL
  Filled 2015-03-26: qty 1

## 2015-03-26 MED ORDER — NALOXEGOL OXALATE 12.5 MG PO TABS: 12.5000 mg | ORAL_TABLET | Freq: Every day | ORAL | Status: DC

## 2015-03-26 MED ORDER — METHOCARBAMOL 500 MG PO TABS: 500.0000 mg | ORAL_TABLET | Freq: Three times a day (TID) | ORAL | Status: DC | PRN

## 2015-03-26 MED ORDER — NALOXEGOL OXALATE 12.5 MG PO TABS
12.5000 mg | ORAL_TABLET | Freq: Every day | ORAL | Status: DC
  Administered 2015-03-26 – 2015-03-27 (×2): 12.5 mg via ORAL
  Filled 2015-03-26 (×3): qty 1

## 2015-03-26 MED ORDER — OXYCODONE HCL 5 MG PO TABS: 5.0000 mg | ORAL_TABLET | ORAL | Status: DC | PRN

## 2015-03-26 NOTE — Progress Notes (Signed)
Orthopedic Tech Progress Note Patient Details:  Megan Valentine 02-03-1950 [id number] Ortho visit on cpm at [place] Patient ID: Megan Valentine, female   DOB: [birth year], 65 y.o.   MRN: [id number]   Megan Valentine 03/26/2015, 6:52 PM

## 2015-03-26 NOTE — Progress Notes (Signed)
Physical Therapy Treatment Patient Details Name: Megan Valentine MRN: [id number] DOB: 08-17-1950 Today's Date: 03/26/2015    History of Present Illness [name] on 2/28. In 1962 appendectomy;1977 tubal ligation; 1999 for total abdominal hysterectomy; 2000 for LASIK to both eyes; 2014 for cataract removal of both eyes; 2014 for YAG laser to the left eye; 2015 for arthroscopy as noted above    PT Comments    Pt requiring increased assist and remains limited with mobility at present time.  Pt declining therapeutic exercise secondary to dizziness and nausea.  Pt will require short term SNF placement unless signifcant progress is shown in am.  Discussed change in recommendations with Supervising PT, RN, and case manager.    Follow Up Recommendations  SNF (Pt progress remains limited due to medical complications, pt will require short term rehab at d/c unless significant progress is shown in am.  )     Equipment Recommendations  None recommended by PT    Recommendations for Other Services       Precautions / Restrictions Precautions Precautions: Fall;Knee Precaution Booklet Issued: Yes (comment) Precaution Comments: knee handout given Restrictions Weight Bearing Restrictions: Yes RLE Weight Bearing: Partial weight bearing RLE Partial Weight Bearing Percentage or Pounds: 50%    Mobility  Bed Mobility Overal bed mobility: Needs Assistance Bed Mobility: Sit to Supine     Supine to sit: Min assist Sit to supine: Min assist   General bed mobility comments: To lift RLE into bed.  Pt remains to require min assist.    Transfers Overall transfer level: Needs assistance Equipment used: Rolling walker (2 wheeled) Transfers: Sit to/from Stand Sit to Stand: Min assist Stand pivot transfers: Min assist       General transfer comment: Pt remains require cues for sequencing and hand placement to improve technique and safety.  Pt required min assist for R foot placement secondary to pain.     Ambulation/Gait Ambulation/Gait assistance: Min assist Ambulation Distance (Feet): 15 Feet (x2 to and from bathroom.  ) Assistive device: Rolling walker (2 wheeled) Gait Pattern/deviations: Step-to pattern;Antalgic     General Gait Details: Pt remains to complain of dizziness and feeling faint with mild c/o nausea.  Pt required mod-max cueing for safety with step to pattern and maintenance of step to pattern with PWB on R.     Stairs            Wheelchair Mobility    Modified Rankin (Stroke Patients Only)       Balance     Sitting balance-Leahy Scale: Fair       Standing balance-Leahy Scale: Poor                      Cognition Arousal/Alertness: Awake/alert Behavior During Therapy: WFL for tasks assessed/performed Overall Cognitive Status: Within Functional Limits for tasks assessed                      Exercises    General Comments        Pertinent Vitals/Pain Pain Assessment: 0-10 Pain Score: 6  Pain Location: R knee with flexion. Pain Descriptors / Indicators: Aching;Burning Pain Intervention(s): Monitored during session;Repositioned    Home Living                      Prior Function            PT Goals (current goals can now be found in the care plan section)  Acute Rehab PT Goals Patient Stated Goal: to go home at d/c Potential to Achieve Goals: Good Progress towards PT goals: Not progressing toward goals - comment (pt requiring increased assist due to medical deficits. )    Frequency  7X/week    PT Plan Discharge plan needs to be updated    Co-evaluation             End of Session Equipment Utilized During Treatment: Gait belt Activity Tolerance: Patient limited by fatigue;Other (comment) (nausea and dizziness.  ) Patient left: in chair;with call bell/phone within reach;with family/visitor present     Time: [id number] PT Time Calculation (min) (ACUTE ONLY): 19 min  Charges:  $Gait Training: 8-22  mins                     G Codes:      Cristela Blue April 19, 2015, 1:51 PM  Governor Rooks, PTA pager [phone]

## 2015-03-26 NOTE — Progress Notes (Signed)
Orthopedic Tech Progress Note Patient Details:  [name] 09-Feb-1950 [id number]  Patient ID: Megan Valentine, female   DOB: Jun 03, 1950, 65 y.o.   MRN: [id number] Pt. On cpm 0-50  Karolee Stamps 03/26/2015, 5:57 AM

## 2015-03-26 NOTE — Progress Notes (Signed)
Physical Therapy Treatment Patient Details Name: Megan Valentine MRN: [id number] DOB: 12-23-1950 Today's Date: 03/26/2015    History of Present Illness [name] on 2/28. In 1962 appendectomy;1977 tubal ligation; 1999 for total abdominal hysterectomy; 2000 for LASIK to both eyes; 2014 for cataract removal of both eyes; 2014 for YAG laser to the left eye; 2015 for arthroscopy as noted above    PT Comments    Pt remains limited to advance mobility.  Informed RN pt not safe to d/c home with dizziness deficits.  Pt transferred to chair and educated to sit upright in hope to advance mobility this pm.  Follow Up Recommendations  Home health PT;Supervision for mobility/OOB     Equipment Recommendations  None recommended by PT    Recommendations for Other Services       Precautions / Restrictions Precautions Precautions: Fall;Knee Restrictions Weight Bearing Restrictions: Yes RLE Weight Bearing: Partial weight bearing RLE Partial Weight Bearing Percentage or Pounds: 50%    Mobility  Bed Mobility Overal bed mobility: Needs Assistance Bed Mobility: Sit to Supine     Supine to sit: Min assist Sit to supine: Min assist   General bed mobility comments: Pt presents with increased pain in R knee requiring min assist to advance to edge of bed.    Transfers Overall transfer level: Needs assistance Equipment used: Rolling walker (2 wheeled) Transfers: Sit to/from [employer] Sit to Stand: Min assist Stand pivot transfers: Min assist       General transfer comment: Pt required cues for hand placement and assist to advance RLE forward to reduce pain.  Pt able to stand edge of bed and initiate gait then c/o feeling presyncopal requiring return to bed.  Pt then required squat pivot from bed to chair.  pt edcuated to sit in chair to improve sitting tolerance in efforts to advance mobility this pm.    Ambulation/Gait Ambulation/Gait assistance: Min assist Ambulation Distance  (Feet): 2 Feet (Pt took two steps forward and backwards requiring. min assist.  pt receieved cueing for maintaining R PWB in stance phase on R.  ) Assistive device: Rolling walker (2 wheeled)       General Gait Details: Pt reports feeling faint and gait unable to progress past 67ft.  Pt edcuated on gait training pre and post.  pt remains limited to practice technique due to dizziness.     Stairs            Wheelchair Mobility    Modified Rankin (Stroke Patients Only)       Balance     Sitting balance-Leahy Scale: Good       Standing balance-Leahy Scale: Fair                      Cognition Arousal/Alertness: Awake/alert Behavior During Therapy: WFL for tasks assessed/performed Overall Cognitive Status: Within Functional Limits for tasks assessed                      Exercises Total Joint Exercises Ankle Circles/Pumps: AROM;Supine;20 reps Quad Sets: AROM;Right;10 reps;Supine Gluteal Sets: AROM;Both;10 reps;Supine Heel Slides: Both;Right;Supine;AAROM Hip ABduction/ADduction: Right;10 reps;Supine;AAROM Straight Leg Raises: AAROM Other Exercises Other Exercises: Pt presents with decreased knee flexion will measure this pm.  Pt required stretching to R quad and HS 2x30 sec holds to improve ROM.      General Comments        Pertinent Vitals/Pain Pain Assessment: 0-10 Pain Score: 10-Worst pain ever Pain Location:  R knee with flexion.   Pain Descriptors / Indicators: Aching;Burning Pain Intervention(s): Monitored during session;Repositioned    Home Living                      Prior Function            PT Goals (current goals can now be found in the care plan section) Acute Rehab PT Goals Patient Stated Goal: to go home at d/c Potential to Achieve Goals: Good Progress towards PT goals: Progressing toward goals    Frequency  7X/week    PT Plan Current plan remains appropriate    Co-evaluation             End of Session  Equipment Utilized During Treatment: Gait belt Activity Tolerance: Patient limited by fatigue Patient left: in chair;with call bell/phone within reach;with family/visitor present     Time: [id number] PT Time Calculation (min) (ACUTE ONLY): 30 min  Charges:  $Therapeutic Exercise: 8-22 mins $Therapeutic Activity: 8-22 mins                    G Codes:      Cristela Blue 2015/03/29, 10:48 AM

## 2015-03-26 NOTE — Progress Notes (Signed)
Patient ID: Megan Valentine, female   DOB: 1950-10-12, 65 y.o.   MRN: [id number] PATIENT ID: Megan Valentine        MRN:  [id number]          DOB/AGE: Dec 26, 1950 / 65 y.o.    Megan Fears, MD   Megan Borg, PA-C [address] [place], [place]  [address]                             [phone]   PROGRESS NOTE  Subjective:  negative for Chest Pain  negative for Shortness of Breath  positive for Nausea/Vomiting yesterday-none today  negative for Calf Pain    Tolerating Diet: no-not much of an apetite         Patient reports pain as moderate.-controlled with analgesics                        Feels "blah"-no difficulty voiding, no BM as yet Objective: Vital signs in last 24 hours:   Patient Vitals for the past 24 hrs:  BP Temp Temp src Pulse Resp SpO2  03/26/15 1127 114/61 mmHg - - 94 - 99 %  03/26/15 0513 (!) 91/55 mmHg 98.9 F (37.2 C) Oral 97 18 92 %  03/25/15 2102 121/74 mmHg 98.2 F (36.8 C) Oral (!) 107 - 99 %  03/25/15 1425 (!) 92/51 mmHg - - - - -  03/25/15 1400 (!) 100/55 mmHg 98.8 F (37.1 C) - 79 16 98 %      Intake/Output from previous day:   03/01 0701 - 03/02 0700 In: 2107 [P.O.:720; I.V.:1287] Out: -    Intake/Output this shift:       Intake/Output      03/01 0701 - 03/02 0700 03/02 0701 - 03/03 0700   P.O. 720    I.V. (mL/kg) 1287 (15.9)    IV Piggyback 100    Total Intake(mL/kg) 2107 (26.1)    Urine (mL/kg/hr)     Emesis/NG output     Drains     Blood     Total Output       Net +2107          Urine Occurrence 5 x 1 x      LABORATORY DATA:  Recent Labs  03/25/15 0445 03/26/15 0400  WBC 10.4 12.2*  HGB 10.0* 9.9*  HCT 32.0* 31.1*  PLT 325 306    Recent Labs  03/25/15 0445 03/26/15 0400  NA 139 141  K 3.4* 3.4*  CL 104 109  CO2 25 23  BUN 7 5*  CREATININE 0.79 0.69  GLUCOSE 102* 107*  CALCIUM 8.1* 8.0*   Lab Results  Component Value Date   INR 1.02 03/13/2015    Recent Radiographic Studies :  Dg Chest 2  View  03/13/2015  CLINICAL DATA:  Preoperative respiratory exam for right knee replacement. EXAM: CHEST  2 VIEW COMPARISON:  02/26/2012 FINDINGS: Heart size is normal. Mild ectasia of the aorta. The lungs are clear. The vascularity is normal. No effusions. No significant bone finding. IMPRESSION: No active disease.  Slight ectasia of the aorta. Electronically Signed   By: Nelson Chimes M.D.   On: 03/13/2015 15:46     Examination:  General appearance: alert, cooperative, fatigued and no distress  Wound Exam: clean, dry, intact   Drainage:  None: wound tissue dry  Motor Exam: EHL, FHL, Anterior Tibial and Posterior Tibial Intact  Sensory Exam: Superficial Peroneal, Deep Peroneal and Tibial normal  Vascular Exam: Normal  Assessment:    2 Days Post-Op  Procedure(s) (LRB): TOTAL KNEE ARTHROPLASTY (Right)  ADDITIONAL DIAGNOSIS:  Principal Problem:   Osteoarthritis of right knee Active Problems:   S/P total knee replacement using cement  Acute Blood Loss Anemia-asymptomatic and stable since yesterday Possible early ileus with decreased BS  Plan: Physical Therapy as ordered Partial Weight Bearing @ 50% (PWB)  DVT Prophylaxis:  Xarelto, Foot Pumps and TED hose  DISCHARGE PLAN: Home  DISCHARGE NEEDS: HHPT, CPM, Walker and 3-in-1 comode seat  Will obtain KUB and monitor, dressing changed right knee-wound clean, dry, decreased BS but no tenderness, no calf or chest pain, continue reglan, probably needs another day to monitor,urine with only 8000 colonies and no burning or frequency    [name]  03/26/2015 11:51 AM

## 2015-03-26 NOTE — Progress Notes (Signed)
Orthopedic Tech Progress Note Patient Details:  Megan Valentine 13-Aug-1950 [id number]  CPM Right Knee CPM Right Knee: On Right Knee Flexion (Degrees): 50 Right Knee Extension (Degrees): 0 Additional Comments: trapeze bar helper   [name] 03/26/2015, 2:27 PM

## 2015-03-27 LAB — BASIC METABOLIC PANEL
ANION GAP: 11 (ref 5–15)
BUN: <5 mg/dL — ABNORMAL LOW (ref 6–20)
CALCIUM: 7.8 mg/dL — AB (ref 8.9–10.3)
CHLORIDE: 107 mmol/L (ref 101–111)
CO2: 21 mmol/L — ABNORMAL LOW (ref 22–32)
CREATININE: 0.68 mg/dL (ref 0.44–1.00)
GFR calc non Af Amer: >60 mL/min (ref >60)
Glucose, Bld: 100 mg/dL — ABNORMAL HIGH (ref 65–99)
Potassium: 3.1 mmol/L — ABNORMAL LOW (ref 3.5–5.1)
SODIUM: 139 mmol/L (ref 135–145)

## 2015-03-27 LAB — CBC
HEMATOCRIT: 30.1 % — AB (ref 36.0–46.0)
HEMOGLOBIN: 9.9 g/dL — AB (ref 12.0–15.0)
MCH: 31.7 pg (ref 26.0–34.0)
MCHC: 32.9 g/dL (ref 30.0–36.0)
MCV: 96.5 fL (ref 78.0–100.0)
Platelets: 284 10*3/uL (ref 150–400)
RBC: 3.12 MIL/uL — AB (ref 3.87–5.11)
RDW: 14 % (ref 11.5–15.5)
WBC: 11.1 10*3/uL — AB (ref 4.0–10.5)

## 2015-03-27 NOTE — Progress Notes (Signed)
Physical Therapy Progress Note  Clinical impression:  Patient did well with step to enter home.  Performs gait safely, however continues to be limited due to dizziness/shakiness.  RN notified.  Instructed patient/husband to place chair on deck for rest break when moving from car to home.  Patient to have f/u HHPT at discharge.   03/27/15 1108  PT Visit Information  Last PT Received On 03/27/15  Assistance Needed +1  History of Present Illness [name] on 2/28. In 1962 appendectomy;1977 tubal ligation; 1999 for total abdominal hysterectomy; 2000 for LASIK to both eyes; 2014 for cataract removal of both eyes; 2014 for YAG laser to the left eye; 2015 for arthroscopy as noted above  PT Time Calculation  PT Start Time (ACUTE ONLY) 1032  PT Stop Time (ACUTE ONLY) 1107  PT Time Calculation (min) (ACUTE ONLY) 35 min  Subjective Data  Subjective Patient reports feeling hot and shaky during gait.  Patient Stated Goal to go home at d/c  Precautions  Precautions Fall;Knee  Precaution Comments Reviewed knee precautions with patient and husband.  Restrictions  Weight Bearing Restrictions Yes  RLE Weight Bearing PWB  RLE Partial Weight Bearing Percentage or Pounds 50  Pain Assessment  Pain Assessment 0-10  Pain Score 3  Pain Location Rt knee  Pain Descriptors / Indicators Aching  Pain Intervention(s) Monitored during session;Repositioned  Cognition  Arousal/Alertness Awake/alert  Behavior During Therapy WFL for tasks assessed/performed  Overall Cognitive Status Within Functional Limits for tasks assessed  Bed Mobility  Overal bed mobility Needs Assistance  Bed Mobility Supine to Sit;Sit to Supine  Supine to sit Min assist  Sit to supine Min assist  General bed mobility comments Assist to move RLE off of and onto bed.  Transfers  Overall transfer level Needs assistance  Equipment used Rolling walker (2 wheeled)  Transfers Sit to/from Stand  Sit to Stand Min guard  General transfer comment  Min guard for safety due to pain.  Ambulation/Gait  Ambulation/Gait assistance Min guard  Ambulation Distance (Feet) 30 Feet (15' x2)  Assistive device Rolling walker (2 wheeled)  Gait Pattern/deviations Step-to pattern;Decreased stance time - right;Decreased step length - left;Decreased stride length;Decreased weight shift to right;Antalgic  General Gait Details Patient demonstrates safe use of RW.  Distance limited due to feeling shaky and hot.  Provided patient cool cloth, drink and crackers.  Gait velocity decreased  Gait velocity interpretation Below normal speed for age/gender  Stairs Yes  Stairs assistance Min assist  Stair Management No rails;Step to pattern;Forwards;With walker  Number of Stairs 1 (1 step up and down x2)  General stair comments Instructed patient to negotiate single step with use of RW.  Instructed husband on safe guarding technique.  Total Joint Exercises  Knee Flexion AROM;Right;5 reps;Seated  Goniometric ROM 65* flexion  PT - End of Session  Equipment Utilized During Treatment Gait belt  Activity Tolerance Patient limited by fatigue (Limited by weakness/shakiness)  Patient left in bed;with call bell/phone within reach;with family/visitor present  Nurse Communication Mobility status (Shakiness)  PT - Assessment/Plan  PT Plan Discharge plan needs to be updated  PT Frequency (ACUTE ONLY) 7X/week  Follow Up Recommendations Home health PT;Supervision/Assistance - 24 hour  PT equipment None recommended by PT  PT Goal Progression  Progress towards PT goals Progressing toward goals (slowly)  PT General Charges  $$ ACUTE PT VISIT 1 Procedure  PT Treatments  $Gait Training 23-37 mins  Carita Pian. Sanjuana Kava, [place] Pager [phone]

## 2015-03-27 NOTE — Progress Notes (Signed)
Occupational Therapy Treatment Patient Details Name: Megan Valentine MRN: [id number] DOB: 1950-04-15 Today's Date: 03/27/2015    History of present illness [name] on 2/28. In 1962 appendectomy;1977 tubal ligation; 1999 for total abdominal hysterectomy; 2000 for LASIK to both eyes; 2014 for cataract removal of both eyes; 2014 for YAG laser to the left eye; 2015 for arthroscopy as noted above   OT comments  Pt motivated for session and excited about returning home today. OT provided education regarding use of AE for LB dressing independence. Pt already owns needed equipment but was unsure of how to use. Pt requested knowledge on shower transfer with TTB as she has one available to her. After demonstration, pt declined to practice and stated she would continue basin/sink bath until more comfortable. OT provided energy conservation education regarding self care and community mobility task in order to increase activity tolerance and decrease fall risk. Pt with no further concerns or questions at this time.   Follow Up Recommendations  No OT follow up    Equipment Recommendations  None recommended by OT    Recommendations for Other Services      Precautions / Restrictions Precautions Precautions: Fall;Knee Restrictions Weight Bearing Restrictions: Yes RLE Weight Bearing: Partial weight bearing RLE Partial Weight Bearing Percentage or Pounds: 50       Mobility Bed Mobility Overal bed mobility: Needs Assistance Bed Mobility: Supine to Sit     Supine to sit: Supervision     General bed mobility comments: utilized L LE to scoop R LE to get feet off of bed.  Transfers Overall transfer level: Needs assistance Equipment used: Rolling walker (2 wheeled)   Sit to Stand: Supervision Stand pivot transfers: Supervision            Balance Overall balance assessment: Needs assistance Sitting-balance support: Feet supported;No upper extremity supported Sitting balance-Leahy Scale:  Fair         ADL Overall ADL's : Needs assistance/impaired       General ADL Comments: Pt supine in bed upon entering the room and awaiting therapist arrival. Pt performed supine >sit with supervision to EOB. Stand pivot transfer to recliner chair with RW and close supervision. OT propelled ot via recliner chair to gym . Therapist demonstrated  shower transfer with TTB and use of RW. Pt declined to practice this session and states she will use basin bath at home until she feel comfortable with shower option. OT educated and demonstrated use of sock aide and reacher for LB dressing with pt returning demonstrations with min verbal cues for technique. Pt returned to room via recliner chair.                     Pertinent Vitals/ Pain       Pain Assessment: 0-10 Pain Score: 3  Pain Location: R knee Pain Descriptors / Indicators: Aching Pain Intervention(s): Monitored during session         Frequency Min 2X/week     Progress Toward Goals  OT Goals(current goals can now be found in the care plan section)  Progress towards OT goals: Progressing toward goals  Acute Rehab OT Goals Patient Stated Goal: to go home at d/c OT Goal Formulation: With patient/family Time For Goal Achievement: 04/10/15 Potential to Achieve Goals: Good  Plan Discharge plan remains appropriate       End of Session Equipment Utilized During Treatment: Rolling walker   Activity Tolerance Patient tolerated treatment well   Patient Left in chair;with  call bell/phone within reach;with family/visitor present           Time: 1115-1138 OT Time Calculation (min): 23 min  Charges: OT General Charges $OT Visit: 1 Procedure OT Treatments $Self Care/Home Management : 23-37 mins  Megan Semen, MS, OTR/L 03/27/2015, 12:18 PM

## 2015-03-27 NOTE — Progress Notes (Signed)
Orthopedic Tech Progress Note Patient Details:  Megan Valentine 04/10/1950 [id number]  Patient ID: [name], female   DOB: 1950/03/26, 65 y.o.   MRN: [id number] Applied cpm 0-50  Karolee Stamps 03/27/2015, 6:04 AM

## 2015-03-27 NOTE — Progress Notes (Signed)
Patient ID: Megan Valentine, female   DOB: [birth year], 65 y.o.   MRN: [id number] PATIENT ID: Megan Valentine        MRN:  [id number]          DOB/AGE: 1950-02-15 / 65 y.o.    Megan Fears, MD   Biagio Borg, PA-C [address] [place], [place]  [address]                             [phone]   PROGRESS NOTE  Subjective:  negative for Chest Pain  negative for Shortness of Breath  negative for Nausea/Vomiting   negative for Calf Pain    Tolerating Diet: yes         Patient reports pain as mild.     Feeling much better this am-eating breakfast  Objective: Vital signs in last 24 hours:   Patient Vitals for the past 24 hrs:  BP Temp Temp src Pulse Resp SpO2  03/27/15 0500 121/67 mmHg 98.9 F (37.2 C) Oral (!) 101 18 92 %  03/26/15 2035 (!) 120/53 mmHg 99.8 F (37.7 C) Oral (!) 101 18 95 %  03/26/15 1542 122/65 mmHg 98.5 F (36.9 C) - 94 16 97 %  03/26/15 1127 114/61 mmHg - - 94 - 99 %      Intake/Output from previous day:   03/02 0701 - 03/03 0700 In: 2070 [P.O.:720; I.V.:1350] Out: -    Intake/Output this shift:       Intake/Output      03/02 0701 - 03/03 0700 03/03 0701 - 03/04 0700   P.O. 720    I.V. (mL/kg) 1350 (16.7)    IV Piggyback     Total Intake(mL/kg) 2070 (25.6)    Net +2070          Urine Occurrence 6 x    Stool Occurrence 1 x       LABORATORY DATA:  Recent Labs  03/25/15 0445 03/26/15 0400  WBC 10.4 12.2*  HGB 10.0* 9.9*  HCT 32.0* 31.1*  PLT 325 306    Recent Labs  03/25/15 0445 03/26/15 0400  NA 139 141  K 3.4* 3.4*  CL 104 109  CO2 25 23  BUN 7 5*  CREATININE 0.79 0.69  GLUCOSE 102* 107*  CALCIUM 8.1* 8.0*   Lab Results  Component Value Date   INR 1.02 03/13/2015    Recent Radiographic Studies :  Dg Chest 2 View  03/13/2015  CLINICAL DATA:  Preoperative respiratory exam for right knee replacement. EXAM: CHEST  2 VIEW COMPARISON:  02/26/2012 FINDINGS: Heart size is normal. Mild ectasia of the aorta. The lungs are  clear. The vascularity is normal. No effusions. No significant bone finding. IMPRESSION: No active disease.  Slight ectasia of the aorta. Electronically Signed   By: Nelson Chimes M.D.   On: 03/13/2015 15:46   Dg Abd 1 View  03/26/2015  CLINICAL DATA:  Nausea and constipation since Monday, knee surgery, history GERD, hiatal hernia, irritable bowel syndrome EXAM: ABDOMEN - 1 VIEW COMPARISON:  10/11/2012 FINDINGS: Gas and stool throughout the colon. Transverse colon 7.2 cm diameter. Small amount of gas in rectum. Small bowel gas pattern normal. No evidence of bowel obstruction or wall thickening. Bones unremarkable. No urinary tract calcifications. IMPRESSION: Nonobstructive bowel gas pattern. Electronically Signed   By: Lavonia Dana M.D.   On: 03/26/2015 14:10     Examination:  General appearance: alert, cooperative and no  distress  Wound Exam: clean, dry, intact   Drainage:  None: wound tissue dry  Motor Exam: EHL, FHL, Anterior Tibial and Posterior Tibial Intact  Sensory Exam: Superficial Peroneal, Deep Peroneal and Tibial normal  Vascular Exam: Normal  Assessment:    3 Days Post-Op  Procedure(s) (LRB): TOTAL KNEE ARTHROPLASTY (Right)  ADDITIONAL DIAGNOSIS:  Principal Problem:   Osteoarthritis of right knee Active Problems:   S/P total knee replacement using cement   Ileus, postoperative   Constipation  Acute Blood Loss Anemia-stable, asymptomatic   Plan: Physical Therapy as ordered Partial Weight Bearing @ 50% (PWB)  DVT Prophylaxis:  Xarelto, Foot Pumps and TED hose  DISCHARGE PLAN: Home  DISCHARGE NEEDS: HHPT, CPM, Walker and 3-in-1 comode seat   KUB not c/w ileus, abdomen flat, non tender, tolerating diet, voiding without difficulty-will D/C today    Megan Valentine W  03/27/2015 7:40 AM

## 2015-03-27 NOTE — Care Management Important Message (Signed)
Important Message  Patient Details  Name: Megan Valentine MRN: [id number] Date of Birth: 02-08-1950   Medicare Important Message Given:  Yes    [name] P Artin Mceuen 03/27/2015, 4:14 PM

## 2015-03-28 DIAGNOSIS — Z471 Aftercare following joint replacement surgery: Secondary | ICD-10-CM | POA: Diagnosis not present

## 2015-03-28 DIAGNOSIS — I059 Rheumatic mitral valve disease, unspecified: Secondary | ICD-10-CM | POA: Diagnosis not present

## 2015-03-28 DIAGNOSIS — K581 Irritable bowel syndrome with constipation: Secondary | ICD-10-CM | POA: Diagnosis not present

## 2015-03-28 DIAGNOSIS — I10 Essential (primary) hypertension: Secondary | ICD-10-CM | POA: Diagnosis not present

## 2015-03-28 DIAGNOSIS — Z96651 Presence of right artificial knee joint: Secondary | ICD-10-CM | POA: Diagnosis not present

## 2015-03-28 DIAGNOSIS — E039 Hypothyroidism, unspecified: Secondary | ICD-10-CM | POA: Diagnosis not present

## 2015-03-28 DIAGNOSIS — M1712 Unilateral primary osteoarthritis, left knee: Secondary | ICD-10-CM | POA: Diagnosis not present

## 2015-03-28 DIAGNOSIS — Z7901 Long term (current) use of anticoagulants: Secondary | ICD-10-CM | POA: Diagnosis not present

## 2015-03-28 DIAGNOSIS — E785 Hyperlipidemia, unspecified: Secondary | ICD-10-CM | POA: Diagnosis not present

## 2015-03-28 DIAGNOSIS — Z79891 Long term (current) use of opiate analgesic: Secondary | ICD-10-CM | POA: Diagnosis not present

## 2015-03-28 DIAGNOSIS — Z87891 Personal history of nicotine dependence: Secondary | ICD-10-CM | POA: Diagnosis not present

## 2015-04-01 NOTE — Discharge Summary (Signed)
Megan Fears, MD   Biagio Borg, PA-C [address], [place], [place]  [address]                             [phone]  PATIENT ID: Megan Valentine        MRN:  [id number]          DOB/AGE: 1950-08-30 / 65 y.o.    DISCHARGE SUMMARY  ADMISSION DATE:    03/24/2015 DISCHARGE DATE:   03/27/2015   ADMISSION DIAGNOSIS: RIGHT KNEE OSTEOARTHRITIS    DISCHARGE DIAGNOSIS:  RIGHT KNEE OSTEOARTHRITIS    ADDITIONAL DIAGNOSIS: Principal Problem:   Osteoarthritis of right knee Active Problems:   S/P total knee replacement using cement   Ileus, postoperative   Constipation  Past Medical History  Diagnosis Date  . Environmental allergies   . Arthritis   . GERD (gastroesophageal reflux disease)   . Hiatal hernia   . Mitral valve prolapse   . Hyperlipidemia   . Hypothyroidism   . Rectal prolapse   . Diverticulosis   . History of chicken pox   . Allergy   . Migraines   . IBS (irritable bowel syndrome)   . Chronic bronchitis ([place])   . Cataract   . Complication of anesthesia     hard to wake up    PROCEDURE: Procedure(s): TOTAL KNEE ARTHROPLASTY Right on 03/24/2015  CONSULTS: none     HISTORY: See H&P in chart  HOSPITAL COURSE:  Megan Valentine is a 65 y.o. admitted on 03/24/2015 and found to have a diagnosis of [place].  After appropriate laboratory studies were obtained  they were taken to the operating room on 03/24/2015 and underwent  Procedure(s): TOTAL KNEE ARTHROPLASTY  Right.   They were given perioperative antibiotics:  Anti-infectives    Start     Dose/Rate Route Frequency Ordered Stop   03/24/15 1330  ceFAZolin (ANCEF) IVPB 2 g/50 mL premix     2 g 100 mL/hr over 30 Minutes Intravenous Every 6 hours 03/24/15 1111 03/24/15 2057   03/24/15 0700  ceFAZolin (ANCEF) IVPB 2 g/50 mL premix     2 g 100 mL/hr over 30 Minutes Intravenous To [facility] 03/23/15 1329 03/24/15 0730    .  Tolerated the procedure well.  Placed with a foley  intraoperatively.     Toradol was given post op.  POD #1, allowed out of bed to a chair.  PT for ambulation and exercise program.  Foley D/C'd in morning.  IV saline locked.  O2 discontionued.  POD #2, continued PT and ambulation.   Hemovac pulled.  Developed an ileus and treated with Reglan and enema with good results.  POD #3, continued PT . The remainder of the hospital course was dedicated to ambulation and strengthening.   The patient was discharged on 3 Days Post-Op in  Stable condition.  Blood products given:none  DIAGNOSTIC STUDIES: Recent vital signs: No data found.      Recent laboratory studies:  Recent Labs  03/26/15 0400 03/27/15 0624  WBC 12.2* 11.1*  HGB 9.9* 9.9*  HCT 31.1* 30.1*  PLT 306 284    Recent Labs  03/26/15 0400 03/27/15 0624  NA 141 139  K 3.4* 3.1*  CL 109 107  CO2 23 21*  BUN 5* <5*  CREATININE 0.69 0.68  GLUCOSE 107* 100*  CALCIUM 8.0* 7.8*   Lab Results  Component Value Date   INR 1.02  03/13/2015     Recent Radiographic Studies :  Dg Chest 2 View  03/13/2015  CLINICAL DATA:  Preoperative respiratory exam for right knee replacement. EXAM: CHEST  2 VIEW COMPARISON:  02/26/2012 FINDINGS: Heart size is normal. Mild ectasia of the aorta. The lungs are clear. The vascularity is normal. No effusions. No significant bone finding. IMPRESSION: No active disease.  Slight ectasia of the aorta. Electronically Signed   By: Nelson Chimes M.D.   On: 03/13/2015 15:46   Dg Abd 1 View  03/26/2015  CLINICAL DATA:  Nausea and constipation since Monday, knee surgery, history GERD, hiatal hernia, irritable bowel syndrome EXAM: ABDOMEN - 1 VIEW COMPARISON:  10/11/2012 FINDINGS: Gas and stool throughout the colon. Transverse colon 7.2 cm diameter. Small amount of gas in rectum. Small bowel gas pattern normal. No evidence of bowel obstruction or wall thickening. Bones unremarkable. No urinary tract calcifications. IMPRESSION: Nonobstructive bowel gas pattern.  Electronically Signed   By: Lavonia Dana M.D.   On: 03/26/2015 14:10    DISCHARGE INSTRUCTIONS: Discharge Instructions    CPM    Complete by:  As directed   Continuous passive motion machine (CPM):      Use the CPM from 0 to 60 for 6-8 hours per day.      You may increase by 5-10 degrees per day.  You may break it up into 2 or 3 sessions per day.      Use CPM for 3-4  weeks or until you are told to stop.     Call MD / Call 911    Complete by:  As directed   If you experience chest pain or shortness of breath, CALL 911 and be transported to the hospital emergency room.  If you develope a fever above 101 F, pus (white drainage) or increased drainage or redness at the wound, or calf pain, call your surgeon's office.     Change dressing    Complete by:  As directed   DO NOT CHANGE YOUR DRESSING     Constipation Prevention    Complete by:  As directed   Drink plenty of fluids.  Prune juice may be helpful.  You may use a stool softener, such as Colace (over the counter) 100 mg twice a day.  Use MiraLax (over the counter) for constipation as needed.     Diet general    Complete by:  As directed      Discharge instructions    Complete by:  As directed   [place] items at home which could result in a fall. This includes throw rugs or furniture in walking pathways ICE to the affected joint every three hours while awake for 30 minutes at a time, for at least the first 3-5 days, and then as needed for pain and swelling.  Continue to use ice for pain and swelling. You may notice swelling that will progress down to the foot and ankle.  This is normal after surgery.  Elevate your leg when you are not up walking on it.   Continue to use the breathing machine you got in the hospital (incentive spirometer) which will help keep your temperature down.  It is common for your temperature to cycle up and down following surgery, especially at night when you are not up moving  around and exerting yourself.  The breathing machine keeps your lungs expanded and your temperature down.   DIET:  As you were doing prior to  hospitalization, we recommend a well-balanced diet.  DRESSING / WOUND CARE / SHOWERING  Keep the surgical dressing until follow up.  The dressing is water proof, so you can shower without any extra covering.  IF THE DRESSING FALLS OFF or the wound gets wet inside, change the dressing with sterile gauze.  Please use good hand washing techniques before changing the dressing.  Do not use any lotions or creams on the incision until instructed by your surgeon.    ACTIVITY  Increase activity slowly as tolerated, but follow the weight bearing instructions below.   No driving for 6 weeks or until further direction given by your physician.  You cannot drive while taking narcotics.  No lifting or carrying greater than 10 lbs. until further directed by your surgeon. Avoid periods of inactivity such as sitting longer than an hour when not asleep. This helps prevent blood clots.  You may return to work once you are authorized by your doctor.     WEIGHT BEARING   Partial weight bearing with assist device as directed.  50% as taught in PT   EXERCISES  Results after joint replacement surgery are often greatly improved when you follow the exercise, range of motion and muscle strengthening exercises prescribed by your doctor. Safety measures are also important to protect the joint from further injury. Any time any of these exercises cause you to have increased pain or swelling, decrease what you are doing until you are comfortable again and then slowly increase them. If you have problems or questions, call your caregiver or physical therapist for advice.   Rehabilitation is important following a joint replacement. After just a few days of immobilization, the muscles of the leg can become weakened and shrink (atrophy).  These exercises are designed to build up the  tone and strength of the thigh and leg muscles and to improve motion. Often times heat used for twenty to thirty minutes before working out will loosen up your tissues and help with improving the range of motion but do not use heat for the first two weeks following surgery (sometimes heat can increase post-operative swelling).   These exercises can be done on a training (exercise) mat, on the floor, on a table or on a bed. Use whatever works the best and is most comfortable for you.    Use music or television while you are exercising so that the exercises are a pleasant break in your day. This will make your life better with the exercises acting as a break in your routine that you can look forward to.   Perform all exercises about fifteen times, three times per day or as directed.  You should exercise both the operative leg and the other leg as well.   Exercises include:   Quad Sets - Tighten up the muscle on the front of the thigh (Quad) and hold for 5-10 seconds.   Straight Leg Raises - With your knee straight (if you were given a brace, keep it on), lift the leg to 60 degrees, hold for 3 seconds, and slowly lower the leg.  Perform this exercise against resistance later as your leg gets stronger.  Leg Slides: Lying on your back, slowly slide your foot toward your buttocks, bending your knee up off the floor (only go as far as is comfortable). Then slowly slide your foot back down until your leg is flat on the floor again.  Angel Wings: Lying on your back spread your legs to the side as  far apart as you can without causing discomfort.  Hamstring Strength:  Lying on your back, push your heel against the floor with your leg straight by tightening up the muscles of your buttocks.  Repeat, but this time bend your knee to a comfortable angle, and push your heel against the floor.  You may put a pillow under the heel to make it more comfortable if necessary.   A rehabilitation program following joint  replacement surgery can speed recovery and prevent re-injury in the future due to weakened muscles. Contact your doctor or a physical therapist for more information on knee rehabilitation.    CONSTIPATION  Constipation is defined medically as fewer than three stools per week and severe constipation as less than one stool per week.  Even if you have a regular bowel pattern at home, your normal regimen is likely to be disrupted due to multiple reasons following surgery.  Combination of anesthesia, postoperative narcotics, change in appetite and fluid intake all can affect your bowels.   YOU MUST use at least one of the following options; they are listed in order of increasing strength to get the job done.  They are all available over the counter, and you may need to use some, POSSIBLY even all of these options:    Drink plenty of fluids (prune juice may be helpful) and high fiber foods Colace 100 mg by mouth twice a day  Senokot for constipation as directed and as needed Dulcolax (bisacodyl), take with full glass of water  Miralax (polyethylene glycol) once or twice a day as needed.  If you have tried all these things and are unable to have a bowel movement in the first 3-4 days after surgery call either your surgeon or your primary doctor.    If you experience loose stools or diarrhea, hold the medications until you stool forms back up.  If your symptoms do not get better within 1 week or if they get worse, check with your doctor.  If you experience "the worst abdominal pain ever" or develop nausea or vomiting, please contact the office immediately for further recommendations for treatment.   ITCHING:  If you experience itching with your medications, try taking only a single pain pill, or even half a pain pill at a time.  You can also use Benadryl over the counter for itching or also to help with sleep.   TED HOSE STOCKINGS:  Use stockings on both legs until for at least 2 weeks or as directed by  physician office. They may be removed at night for sleeping.  MEDICATIONS:  See your medication summary on the "After Visit Summary" that nursing will review with you.  You may have some home medications which will be placed on hold until you complete the course of blood thinner medication.  It is important for you to complete the blood thinner medication as prescribed.  PRECAUTIONS:  If you experience chest pain or shortness of breath - call 911 immediately for transfer to the hospital emergency department.   If you develop a fever greater that 101 F, purulent drainage from wound, increased redness or drainage from wound, foul odor from the wound/dressing, or calf pain - CONTACT YOUR SURGEON.  FOLLOW-UP APPOINTMENTS:  If you do not already have a post-op appointment, please call the office for an appointment to be seen by your surgeon.  Guidelines for how soon to be seen are listed in your "After Visit Summary", but are typically between 1-4 weeks after surgery.  OTHER INSTRUCTIONS:   Knee Replacement:  Do not place pillow under knee, focus on keeping the knee straight while resting. CPM instructions: 0-90 degrees, 2 hours in the morning, 2 hours in the afternoon, and 2 hours in the evening. Place foam block, curve side up under heel at all times except when in CPM or when walking.  DO NOT modify, tear, cut, or change the foam block in any way.  MAKE SURE YOU:  Understand these instructions.  Get help right away if you are not doing well or get worse.    Thank you for letting us be a part of your medical care team.  It is a privilege we respect greatly.  We hope these instructions will help you stay on track for a fast and full recovery!     Do not put a pillow under the knee. Place it under the heel.    Complete by:  As directed      Driving restrictions    Complete by:  As directed   No driving for 6 weeks     Increase activity slowly as  tolerated    Complete by:  As directed      Lifting restrictions    Complete by:  As directed   No lifting for 6 weeks     Partial weight bearing    Complete by:  As directed   % Body Weight:  50%  Laterality:  right  Extremity:  Lower     Patient may shower    Complete by:  As directed   You may shower over the brown dressing     TED hose    Complete by:  As directed   Use stockings (TED hose) for 2-3 weeks on right leg.  You may remove them at night for sleeping.           DISCHARGE MEDICATIONS:     Medication List    STOP taking these medications        acetaminophen 650 MG CR tablet  Commonly known as:  TYLENOL     aspirin 81 MG tablet     Biotin 5000 MCG Caps     Fish Oil 1200 MG Caps     GLUCOSAMINE COMPLEX PO     Turmeric 450 MG Caps      TAKE these medications        cholecalciferol 1000 units tablet  Commonly known as:  VITAMIN D  Take 2,000 Units by mouth daily.     estradiol 0.5 MG tablet  Commonly known as:  ESTRACE  Take 1 tablet (0.5 mg total) by mouth daily.     fluticasone 50 MCG/ACT nasal spray  Commonly known as:  FLONASE  USE 2 SPRAYS IN EACH NOSTRIL EVERY DAY     levothyroxine 100 MCG tablet  Commonly known as:  SYNTHROID, LEVOTHROID  Take 1 tablet (100 mcg total) by mouth daily.     loratadine 10 MG tablet  Commonly known as:  CLARITIN  Take 10 mg by mouth daily. Reported on 03/11/2015     meclizine 25 MG tablet  Commonly known as:  ANTIVERT  Take 25 mg by mouth daily as needed for dizziness.  methocarbamol 500 MG tablet  Commonly known as:  ROBAXIN  Take 1 tablet (500 mg total) by mouth every 8 (eight) hours as needed for muscle spasms.     naloxegol oxalate 12.5 MG Tabs tablet  Commonly known as:  MOVANTIK  Take 1 tablet (12.5 mg total) by mouth daily.     oxyCODONE 5 MG immediate release tablet  Commonly known as:  Oxy IR/ROXICODONE  Take 1-2 tablets (5-10 mg total) by mouth every 4 (four) hours as needed for  breakthrough pain.     pantoprazole 20 MG tablet  Commonly known as:  PROTONIX  Take 1 tablet (20 mg total) by mouth daily.     Potassium Chloride ER 20 MEQ Tbcr  TAKE 1 TABLET BY MOUTH DAILY     PROAIR RESPICLICK 123XX123 (90 Base) MCG/ACT Aepb  Generic drug:  Albuterol Sulfate  INHALE 2 PUFFS EVERY 4 HOURS AS NEEDED FOR SHORTNESS OF BREATH     rivaroxaban 10 MG Tabs tablet  Commonly known as:  XARELTO  Take 1 tablet (10 mg total) by mouth daily with breakfast.     triamterene-hydrochlorothiazide 37.5-25 MG tablet  Commonly known as:  MAXZIDE-25  Take 1 tablet by mouth daily.     VEGETABLE LAXATIVE PO  Take 11 tablets by mouth at bedtime.     VITAMIN B12 PO  Take 1,000 mg by mouth daily. 2500 mg daily        FOLLOW UP VISIT:       Follow-up Information    Follow up with [facility].   Why:  They will contact you to schedule home therapy visits.   Contact information:   [place] [address] [phone]       DISPOSITION:   Home  CONDITION:  Stable   Mike Craze. [place], [place] [phone]  04/01/2015 7:41 PM

## 2015-04-06 DIAGNOSIS — Z96651 Presence of right artificial knee joint: Secondary | ICD-10-CM | POA: Diagnosis not present

## 2015-04-06 DIAGNOSIS — I059 Rheumatic mitral valve disease, unspecified: Secondary | ICD-10-CM | POA: Diagnosis not present

## 2015-04-06 DIAGNOSIS — I10 Essential (primary) hypertension: Secondary | ICD-10-CM | POA: Diagnosis not present

## 2015-04-06 DIAGNOSIS — M1712 Unilateral primary osteoarthritis, left knee: Secondary | ICD-10-CM | POA: Diagnosis not present

## 2015-04-06 DIAGNOSIS — Z79891 Long term (current) use of opiate analgesic: Secondary | ICD-10-CM | POA: Diagnosis not present

## 2015-04-06 DIAGNOSIS — E039 Hypothyroidism, unspecified: Secondary | ICD-10-CM | POA: Diagnosis not present

## 2015-04-06 DIAGNOSIS — Z87891 Personal history of nicotine dependence: Secondary | ICD-10-CM | POA: Diagnosis not present

## 2015-04-06 DIAGNOSIS — Z7901 Long term (current) use of anticoagulants: Secondary | ICD-10-CM | POA: Diagnosis not present

## 2015-04-06 DIAGNOSIS — K581 Irritable bowel syndrome with constipation: Secondary | ICD-10-CM | POA: Diagnosis not present

## 2015-04-06 DIAGNOSIS — Z471 Aftercare following joint replacement surgery: Secondary | ICD-10-CM | POA: Diagnosis not present

## 2015-04-06 DIAGNOSIS — E785 Hyperlipidemia, unspecified: Secondary | ICD-10-CM | POA: Diagnosis not present

## 2015-04-08 ENCOUNTER — Other Ambulatory Visit: Payer: Self-pay | Admitting: Internal Medicine

## 2015-04-14 DIAGNOSIS — M25561 Pain in right knee: Secondary | ICD-10-CM | POA: Diagnosis not present

## 2015-04-14 DIAGNOSIS — R269 Unspecified abnormalities of gait and mobility: Secondary | ICD-10-CM | POA: Diagnosis not present

## 2015-04-14 DIAGNOSIS — M25661 Stiffness of right knee, not elsewhere classified: Secondary | ICD-10-CM | POA: Diagnosis not present

## 2015-04-14 DIAGNOSIS — M25361 Other instability, right knee: Secondary | ICD-10-CM | POA: Diagnosis not present

## 2015-04-16 DIAGNOSIS — R269 Unspecified abnormalities of gait and mobility: Secondary | ICD-10-CM | POA: Diagnosis not present

## 2015-04-16 DIAGNOSIS — M25661 Stiffness of right knee, not elsewhere classified: Secondary | ICD-10-CM | POA: Diagnosis not present

## 2015-04-16 DIAGNOSIS — M25561 Pain in right knee: Secondary | ICD-10-CM | POA: Diagnosis not present

## 2015-04-16 DIAGNOSIS — M25361 Other instability, right knee: Secondary | ICD-10-CM | POA: Diagnosis not present

## 2015-04-21 DIAGNOSIS — M25661 Stiffness of right knee, not elsewhere classified: Secondary | ICD-10-CM | POA: Diagnosis not present

## 2015-04-21 DIAGNOSIS — M25361 Other instability, right knee: Secondary | ICD-10-CM | POA: Diagnosis not present

## 2015-04-21 DIAGNOSIS — R269 Unspecified abnormalities of gait and mobility: Secondary | ICD-10-CM | POA: Diagnosis not present

## 2015-04-21 DIAGNOSIS — M25561 Pain in right knee: Secondary | ICD-10-CM | POA: Diagnosis not present

## 2015-04-23 DIAGNOSIS — M25361 Other instability, right knee: Secondary | ICD-10-CM | POA: Diagnosis not present

## 2015-04-23 DIAGNOSIS — R262 Difficulty in walking, not elsewhere classified: Secondary | ICD-10-CM | POA: Diagnosis not present

## 2015-04-23 DIAGNOSIS — M25661 Stiffness of right knee, not elsewhere classified: Secondary | ICD-10-CM | POA: Diagnosis not present

## 2015-04-23 DIAGNOSIS — M25561 Pain in right knee: Secondary | ICD-10-CM | POA: Diagnosis not present

## 2015-04-28 DIAGNOSIS — M25661 Stiffness of right knee, not elsewhere classified: Secondary | ICD-10-CM | POA: Diagnosis not present

## 2015-04-28 DIAGNOSIS — R269 Unspecified abnormalities of gait and mobility: Secondary | ICD-10-CM | POA: Diagnosis not present

## 2015-04-28 DIAGNOSIS — M25561 Pain in right knee: Secondary | ICD-10-CM | POA: Diagnosis not present

## 2015-04-28 DIAGNOSIS — M25361 Other instability, right knee: Secondary | ICD-10-CM | POA: Diagnosis not present

## 2015-04-30 DIAGNOSIS — M25561 Pain in right knee: Secondary | ICD-10-CM | POA: Diagnosis not present

## 2015-04-30 DIAGNOSIS — M25361 Other instability, right knee: Secondary | ICD-10-CM | POA: Diagnosis not present

## 2015-04-30 DIAGNOSIS — M25661 Stiffness of right knee, not elsewhere classified: Secondary | ICD-10-CM | POA: Diagnosis not present

## 2015-04-30 DIAGNOSIS — R269 Unspecified abnormalities of gait and mobility: Secondary | ICD-10-CM | POA: Diagnosis not present

## 2015-05-05 ENCOUNTER — Telehealth: Payer: Self-pay | Admitting: Internal Medicine

## 2015-05-05 ENCOUNTER — Other Ambulatory Visit: Payer: Self-pay

## 2015-05-05 DIAGNOSIS — Z1231 Encounter for screening mammogram for malignant neoplasm of breast: Secondary | ICD-10-CM

## 2015-05-05 NOTE — Telephone Encounter (Signed)
[facility] - [facility] - [id number]    EOC ref # [id number] - PA for a pt.    She is will also be faxing over a form for pt medication.

## 2015-05-06 NOTE — Telephone Encounter (Signed)
PA form received via fax.  Completed and placed on MD's desk for signature

## 2015-05-06 NOTE — Telephone Encounter (Signed)
PA form signed and faxed.

## 2015-05-07 DIAGNOSIS — H524 Presbyopia: Secondary | ICD-10-CM | POA: Diagnosis not present

## 2015-05-08 ENCOUNTER — Other Ambulatory Visit: Payer: Self-pay | Admitting: Internal Medicine

## 2015-05-11 NOTE — Telephone Encounter (Signed)
PA APPROVED through 01/24/2016

## 2015-05-13 DIAGNOSIS — M25361 Other instability, right knee: Secondary | ICD-10-CM | POA: Diagnosis not present

## 2015-05-13 DIAGNOSIS — M25661 Stiffness of right knee, not elsewhere classified: Secondary | ICD-10-CM | POA: Diagnosis not present

## 2015-05-13 DIAGNOSIS — M25561 Pain in right knee: Secondary | ICD-10-CM | POA: Diagnosis not present

## 2015-05-13 DIAGNOSIS — R269 Unspecified abnormalities of gait and mobility: Secondary | ICD-10-CM | POA: Diagnosis not present

## 2015-05-26 ENCOUNTER — Ambulatory Visit
Admission: RE | Admit: 2015-05-26 | Discharge: 2015-05-26 | Disposition: A | Discharge status: Home / Self Care | Payer: PPO | Source: Ambulatory Visit

## 2015-05-26 ENCOUNTER — Ambulatory Visit: Payer: PPO

## 2015-05-26 DIAGNOSIS — Z1231 Encounter for screening mammogram for malignant neoplasm of breast: Secondary | ICD-10-CM | POA: Diagnosis not present

## 2015-05-28 ENCOUNTER — Other Ambulatory Visit: Payer: Self-pay | Admitting: Internal Medicine

## 2015-05-28 DIAGNOSIS — R928 Other abnormal and inconclusive findings on diagnostic imaging of breast: Secondary | ICD-10-CM

## 2015-06-03 ENCOUNTER — Ambulatory Visit
Admission: RE | Admit: 2015-06-03 | Discharge: 2015-06-03 | Disposition: A | Discharge status: Home / Self Care | Payer: PPO | Source: Ambulatory Visit | Attending: Internal Medicine | Admitting: Internal Medicine

## 2015-06-03 DIAGNOSIS — N63 Unspecified lump in breast: Secondary | ICD-10-CM | POA: Diagnosis not present

## 2015-06-03 DIAGNOSIS — R928 Other abnormal and inconclusive findings on diagnostic imaging of breast: Secondary | ICD-10-CM

## 2015-06-03 DIAGNOSIS — N6012 Diffuse cystic mastopathy of left breast: Secondary | ICD-10-CM | POA: Diagnosis not present

## 2015-07-02 ENCOUNTER — Ambulatory Visit (INDEPENDENT_AMBULATORY_CARE_PROVIDER_SITE_OTHER): Payer: PPO | Admitting: Family

## 2015-07-02 ENCOUNTER — Encounter: Payer: Self-pay | Admitting: Family

## 2015-07-02 VITALS — BP 122/86 | HR 89 | Temp 98.0°F | Resp 16 | Ht 61.0 in | Wt 164.2 lb

## 2015-07-02 DIAGNOSIS — J019 Acute sinusitis, unspecified: Secondary | ICD-10-CM | POA: Insufficient documentation

## 2015-07-02 DIAGNOSIS — J0100 Acute maxillary sinusitis, unspecified: Secondary | ICD-10-CM

## 2015-07-02 MED ORDER — LEVOFLOXACIN 500 MG PO TABS: 500.0000 mg | ORAL_TABLET | Freq: Every day | ORAL | Status: DC

## 2015-07-02 NOTE — Progress Notes (Signed)
Pre visit review using our clinic review tool, if applicable. No additional management support is needed unless otherwise documented below in the visit note. 

## 2015-07-02 NOTE — Assessment & Plan Note (Signed)
Symptoms and exam consistent with sinusitis. Previously completed Amoxicillin. Start levofloxacin. Continue over the counter medications as needed for symptom relief and supportive care. Follow up for symptom worsening.

## 2015-07-02 NOTE — Progress Notes (Signed)
Subjective:    Patient ID: Megan Valentine, female    DOB: 1950-05-04, 65 y.o.   MRN: [id number]  Chief Complaint  Patient presents with  . Cough    x3 weeks has had congestion in head and chest, some SOB    HPI:  Megan Valentine is a 65 y.o. female who  has a past medical history of Environmental allergies; Arthritis; GERD (gastroesophageal reflux disease); Hiatal hernia; Mitral valve prolapse; Hyperlipidemia; Hypothyroidism; Rectal prolapse; Diverticulosis; History of chicken pox; Allergy; Migraines; IBS (irritable bowel syndrome); Chronic bronchitis ([place]); Cataract; and Complication of anesthesia. and presents today for an acute office visit.   This is a new problem. Associated symptom of a cough, congestion, occasional shortness of breath and sinus pressure have been going on for about 3 weeks. Denies fevers. Recently recovered from a stomach bug. Modifying factors include Allergy medicaitons and sudafed PE which have not helped very much. Course of the symptoms has remained the same. She completed a course of amoxicillin for 6-7 days that she had for dental work which did not help very much.  Allergies  Allergen Reactions  . Tetracycline Hives and Itching     Current Outpatient Prescriptions on File Prior to Visit  Medication Sig Dispense Refill  . cholecalciferol (VITAMIN D) 1000 UNITS tablet Take 2,000 Units by mouth daily.     . Cyanocobalamin (VITAMIN B12 PO) Take 1,000 mg by mouth daily. 2500 mg daily    . estradiol (ESTRACE) 0.5 MG tablet TAKE 1 TABLET(0.5 MG) BY MOUTH DAILY 90 tablet 0  . fluticasone (FLONASE) 50 MCG/ACT nasal spray USE 2 SPRAYS IN EACH NOSTRIL EVERY DAY (Patient not taking: Reported on 03/11/2015) 16 g 9  . levothyroxine (SYNTHROID, LEVOTHROID) 100 MCG tablet Take 1 tablet (100 mcg total) by mouth daily. 90 tablet 1  . loratadine (CLARITIN) 10 MG tablet Take 10 mg by mouth daily. Reported on 03/11/2015    . meclizine (ANTIVERT) 25 MG tablet Take 25 mg by  mouth daily as needed for dizziness.     . methocarbamol (ROBAXIN) 500 MG tablet Take 1 tablet (500 mg total) by mouth every 8 (eight) hours as needed for muscle spasms. 40 tablet 0  . oxyCODONE (OXY IR/ROXICODONE) 5 MG immediate release tablet Take 1-2 tablets (5-10 mg total) by mouth every 4 (four) hours as needed for breakthrough pain. 90 tablet 0  . pantoprazole (PROTONIX) 20 MG tablet TAKE 1 TABLET(20 MG) BY MOUTH DAILY 90 tablet 0  . Potassium Chloride ER 20 MEQ TBCR TAKE 1 TABLET BY MOUTH DAILY 90 tablet 0  . PROAIR RESPICLICK 123XX123 (90 BASE) MCG/ACT AEPB INHALE 2 PUFFS EVERY 4 HOURS AS NEEDED FOR SHORTNESS OF BREATH (Patient not taking: Reported on 03/11/2015) 1 each 0  . Psyllium (VEGETABLE LAXATIVE PO) Take 11 tablets by mouth at bedtime.    . triamterene-hydrochlorothiazide (MAXZIDE-25) 37.5-25 MG tablet TAKE 1 TABLET BY MOUTH DAILY 90 tablet 0   No current facility-administered medications on file prior to visit.    Review of Systems  Constitutional: Negative for fever and chills.  HENT: Positive for congestion, sinus pressure and sore throat.   Respiratory: Positive for cough. Negative for chest tightness and shortness of breath.   Neurological: Negative for headaches.      Objective:    BP 122/86 mmHg  Pulse 89  Temp(Src) 98 F (36.7 C) (Oral)  Resp 16  Ht 5\' 1"  (1.549 m)  Wt 164 lb 3.2 oz (74.481 kg)  BMI 31.04  kg/m2  SpO2 97% Nursing note and vital signs reviewed.  Physical Exam  Constitutional: She is oriented to person, place, and time. She appears well-developed and well-nourished. No distress.  HENT:  Right Ear: Hearing, tympanic membrane, external ear and ear canal normal.  Left Ear: Hearing, tympanic membrane, external ear and ear canal normal.  Nose: Right sinus exhibits maxillary sinus tenderness and frontal sinus tenderness. Left sinus exhibits maxillary sinus tenderness and frontal sinus tenderness.  Mouth/Throat: Uvula is midline, oropharynx is clear and  moist and mucous membranes are normal.  Neck: Neck supple.  Cardiovascular: Normal rate, regular rhythm, normal heart sounds and intact distal pulses.   Pulmonary/Chest: Effort normal and breath sounds normal.  Neurological: She is alert and oriented to person, place, and time.  Skin: Skin is warm and dry.  Psychiatric: She has a normal mood and affect. Her behavior is normal. Judgment and thought content normal.       Assessment & Plan:   Problem List Items Addressed This Visit      Respiratory   Sinusitis - Primary    Symptoms and exam consistent with sinusitis. Previously completed Amoxicillin. Start levofloxacin. Continue over the counter medications as needed for symptom relief and supportive care. Follow up for symptom worsening.       Relevant Medications   levofloxacin (LEVAQUIN) 500 MG tablet       I have discontinued Ms. Siebenaler's rivaroxaban and naloxegol oxalate. I am also having her start on levofloxacin. Additionally, I am having her maintain her cholecalciferol, loratadine, fluticasone, meclizine, Cyanocobalamin (VITAMIN B12 PO), PROAIR RESPICLICK, Potassium Chloride ER, levothyroxine, Psyllium (VEGETABLE LAXATIVE PO), methocarbamol, oxyCODONE, triamterene-hydrochlorothiazide, pantoprazole, and estradiol.   Meds ordered this encounter  Medications  . levofloxacin (LEVAQUIN) 500 MG tablet    Sig: Take 1 tablet (500 mg total) by mouth daily.    Dispense:  7 tablet    Refill:  0    Order Specific Question:  Supervising Provider    Answer:  Pricilla Holm A [id number]     Follow-up: Return if symptoms worsen or fail to improve.   Mauricio Po, FNP

## 2015-07-02 NOTE — Patient Instructions (Signed)
Thank you for choosing [facility].  Summary/Instructions:  Please start levofloxacin.  Your prescription(s) have been submitted to your pharmacy or been printed and provided for you. Please take as directed and contact our office if you believe you are having problem(s) with the medication(s) or have any questions.  If your symptoms worsen or fail to improve, please contact our office for further instruction, or in case of emergency go directly to the emergency room at the closest medical facility.    General Recommendations:    Please drink plenty of fluids.  Get plenty of rest   Sleep in humidified air  Use saline nasal sprays  Netti pot   OTC Medications:  Decongestants - helps relieve congestion   Flonase (generic fluticasone) or Nasacort (generic triamcinolone) - please make sure to use the "cross-over" technique at a 45 degree angle towards the opposite eye as opposed to straight up the nasal passageway.   Sudafed (generic pseudoephedrine - Note this is the one that is available behind the pharmacy counter); Products with phenylephrine (-PE) may also be used but is often not as effective as pseudoephedrine.   If you have HIGH BLOOD PRESSURE - Coricidin HBP; AVOID any product that is -D as this contains pseudoephedrine which may increase your blood pressure.  Afrin (oxymetazoline) every 6-8 hours for up to 3 days.   Allergies - helps relieve runny nose, itchy eyes and sneezing   Claritin (generic loratidine), Allegra (fexofenidine), or Zyrtec (generic cyrterizine) for runny nose. These medications should not cause drowsiness.  Note - Benadryl (generic diphenhydramine) may be used however may cause drowsiness  Cough -   Delsym or Robitussin (generic dextromethorphan)  Expectorants - helps loosen mucus to ease removal   Mucinex (generic guaifenesin) as directed on the package.  Headaches / General Aches   Tylenol (generic acetaminophen) - DO NOT  EXCEED 3 grams (3,000 mg) in a 24 hour time period  Advil/Motrin (generic ibuprofen)   Sore Throat -   Salt water gargle   Chloraseptic (generic benzocaine) spray or lozenges / Sucrets (generic dyclonine)    Sinusitis Sinusitis is redness, soreness, and inflammation of the paranasal sinuses. Paranasal sinuses are air pockets within the bones of your face (beneath the eyes, the middle of the forehead, or above the eyes). In healthy paranasal sinuses, mucus is able to drain out, and air is able to circulate through them by way of your nose. However, when your paranasal sinuses are inflamed, mucus and air can become trapped. This can allow bacteria and other germs to grow and cause infection. Sinusitis can develop quickly and last only a short time (acute) or continue over a long period (chronic). Sinusitis that lasts for more than 12 weeks is considered chronic.  CAUSES  Causes of sinusitis include:  Allergies.  Structural abnormalities, such as displacement of the cartilage that separates your nostrils (deviated septum), which can decrease the air flow through your nose and sinuses and affect sinus drainage.  Functional abnormalities, such as when the small hairs (cilia) that line your sinuses and help remove mucus do not work properly or are not present. SIGNS AND SYMPTOMS  Symptoms of acute and chronic sinusitis are the same. The primary symptoms are pain and pressure around the affected sinuses. Other symptoms include:  Upper toothache.  Earache.  Headache.  Bad breath.  Decreased sense of smell and taste.  A cough, which worsens when you are lying flat.  Fatigue.  Fever.  Thick drainage from your nose, which  often is green and may contain pus (purulent).  Swelling and warmth over the affected sinuses. DIAGNOSIS  Your health care provider will perform a physical exam. During the exam, your health care provider may:  Look in your nose for signs of abnormal growths in  your nostrils (nasal polyps).  Tap over the affected sinus to check for signs of infection.  View the inside of your sinuses (endoscopy) using an imaging device that has a light attached (endoscope). If your health care provider suspects that you have chronic sinusitis, one or more of the following tests may be recommended:  Allergy tests.  Nasal culture. A sample of mucus is taken from your nose, sent to a lab, and screened for bacteria.  Nasal cytology. A sample of mucus is taken from your nose and examined by your health care provider to determine if your sinusitis is related to an allergy. TREATMENT  Most cases of acute sinusitis are related to a viral infection and will resolve on their own within 10 days. Sometimes medicines are prescribed to help relieve symptoms (pain medicine, decongestants, nasal steroid sprays, or saline sprays).  However, for sinusitis related to a bacterial infection, your health care provider will prescribe antibiotic medicines. These are medicines that will help kill the bacteria causing the infection.  Rarely, sinusitis is caused by a fungal infection. In theses cases, your health care provider will prescribe antifungal medicine. For some cases of chronic sinusitis, surgery is needed. Generally, these are cases in which sinusitis recurs more than 3 times per year, despite other treatments. HOME CARE INSTRUCTIONS   Drink plenty of water. Water helps thin the mucus so your sinuses can drain more easily.  Use a humidifier.  Inhale steam 3 to 4 times a day (for example, sit in the bathroom with the shower running).  Apply a warm, moist washcloth to your face 3 to 4 times a day, or as directed by your health care provider.  Use saline nasal sprays to help moisten and clean your sinuses.  Take medicines only as directed by your health care provider.  If you were prescribed either an antibiotic or antifungal medicine, finish it all even if you start to feel  better. SEEK IMMEDIATE MEDICAL CARE IF:  You have increasing pain or severe headaches.  You have nausea, vomiting, or drowsiness.  You have swelling around your face.  You have vision problems.  You have a stiff neck.  You have difficulty breathing. MAKE SURE YOU:   Understand these instructions.  Will watch your condition.  Will get help right away if you are not doing well or get worse. Document Released: 01/10/2005 Document Revised: 05/27/2013 Document Reviewed: 01/25/2011 [facility] Patient Information 2015 [place], [place]. This information is not intended to replace advice given to you by your health care provider. Make sure you discuss any questions you have with your health care provider.

## 2015-07-07 ENCOUNTER — Other Ambulatory Visit: Payer: Self-pay | Admitting: Internal Medicine

## 2015-07-22 DIAGNOSIS — Z96651 Presence of right artificial knee joint: Secondary | ICD-10-CM | POA: Diagnosis not present

## 2015-07-22 DIAGNOSIS — M1712 Unilateral primary osteoarthritis, left knee: Secondary | ICD-10-CM | POA: Diagnosis not present

## 2015-08-07 ENCOUNTER — Other Ambulatory Visit: Payer: Self-pay | Admitting: Internal Medicine

## 2015-08-14 ENCOUNTER — Other Ambulatory Visit: Payer: Self-pay | Admitting: Internal Medicine

## 2015-08-17 DIAGNOSIS — H40013 Open angle with borderline findings, low risk, bilateral: Secondary | ICD-10-CM | POA: Diagnosis not present

## 2015-08-28 ENCOUNTER — Other Ambulatory Visit: Payer: Self-pay | Admitting: Internal Medicine

## 2015-09-05 ENCOUNTER — Other Ambulatory Visit: Payer: Self-pay | Admitting: Internal Medicine

## 2015-09-10 DIAGNOSIS — M7711 Lateral epicondylitis, right elbow: Secondary | ICD-10-CM | POA: Diagnosis not present

## 2015-09-11 ENCOUNTER — Encounter: Payer: Self-pay | Admitting: Internal Medicine

## 2015-09-11 ENCOUNTER — Ambulatory Visit (INDEPENDENT_AMBULATORY_CARE_PROVIDER_SITE_OTHER): Payer: PPO | Admitting: Internal Medicine

## 2015-09-11 ENCOUNTER — Other Ambulatory Visit (INDEPENDENT_AMBULATORY_CARE_PROVIDER_SITE_OTHER): Payer: PPO

## 2015-09-11 VITALS — BP 118/76 | HR 81 | Temp 98.0°F | Resp 12 | Ht 64.0 in | Wt 163.0 lb

## 2015-09-11 DIAGNOSIS — I10 Essential (primary) hypertension: Secondary | ICD-10-CM

## 2015-09-11 DIAGNOSIS — Z1159 Encounter for screening for other viral diseases: Secondary | ICD-10-CM

## 2015-09-11 DIAGNOSIS — Z0000 Encounter for general adult medical examination without abnormal findings: Secondary | ICD-10-CM

## 2015-09-11 DIAGNOSIS — E785 Hyperlipidemia, unspecified: Secondary | ICD-10-CM | POA: Diagnosis not present

## 2015-09-11 DIAGNOSIS — E039 Hypothyroidism, unspecified: Secondary | ICD-10-CM

## 2015-09-11 LAB — LIPID PANEL
CHOLESTEROL: 239 mg/dL — AB (ref 0–200)
HDL: 62.7 mg/dL (ref >39.00)
LDL Cholesterol: 143 mg/dL — ABNORMAL HIGH (ref 0–99)
NonHDL: 175.86
Total CHOL/HDL Ratio: 4
Triglycerides: 163 mg/dL — ABNORMAL HIGH (ref 0.0–149.0)
VLDL: 32.6 mg/dL (ref 0.0–40.0)

## 2015-09-11 LAB — COMPREHENSIVE METABOLIC PANEL
ALBUMIN: 4.3 g/dL (ref 3.5–5.2)
ALK PHOS: 74 U/L (ref 39–117)
ALT: 11 U/L (ref 0–35)
AST: 13 U/L (ref 0–37)
BUN: 21 mg/dL (ref 6–23)
CALCIUM: 9.7 mg/dL (ref 8.4–10.5)
CO2: 31 mEq/L (ref 19–32)
CREATININE: 1.11 mg/dL (ref 0.40–1.20)
Chloride: 102 mEq/L (ref 96–112)
GFR: 52.35 mL/min — ABNORMAL LOW (ref >60.00)
Glucose, Bld: 96 mg/dL (ref 70–99)
Potassium: 3.6 mEq/L (ref 3.5–5.1)
SODIUM: 141 meq/L (ref 135–145)
TOTAL PROTEIN: 7.3 g/dL (ref 6.0–8.3)
Total Bilirubin: 0.7 mg/dL (ref 0.2–1.2)

## 2015-09-11 LAB — HEPATITIS C ANTIBODY: HCV Ab: NEGATIVE

## 2015-09-11 LAB — TSH: TSH: 0.94 u[IU]/mL (ref 0.35–4.50)

## 2015-09-11 NOTE — Patient Instructions (Signed)
We will check the labs today and call you with the results.   Keep up the good work with walking to stay healthy.   Health Maintenance, Female Adopting a healthy lifestyle and getting preventive care can go a long way to promote health and wellness. Talk with your health care provider about what schedule of regular examinations is right for you. This is a good chance for you to check in with your provider about disease prevention and staying healthy. In between checkups, there are plenty of things you can do on your own. Experts have done a lot of research about which lifestyle changes and preventive measures are most likely to keep you healthy. Ask your health care provider for more information. WEIGHT AND DIET  Eat a healthy diet  Be sure to include plenty of vegetables, fruits, low-fat dairy products, and lean protein.  Do not eat a lot of foods high in solid fats, added sugars, or salt.  Get regular exercise. This is one of the most important things you can do for your health.  Most adults should exercise for at least 150 minutes each week. The exercise should increase your heart rate and make you sweat (moderate-intensity exercise).  Most adults should also do strengthening exercises at least twice a week. This is in addition to the moderate-intensity exercise.  Maintain a healthy weight  Body mass index (BMI) is a measurement that can be used to identify possible weight problems. It estimates body fat based on height and weight. Your health care provider can help determine your BMI and help you achieve or maintain a healthy weight.  For females 55 years of age and older:   A BMI below 18.5 is considered underweight.  A BMI of 18.5 to 24.9 is normal.  A BMI of 25 to 29.9 is considered overweight.  A BMI of 30 and above is considered obese.  Watch levels of cholesterol and blood lipids  You should start having your blood tested for lipids and cholesterol at 65 years of age,  then have this test every 5 years.  You may need to have your cholesterol levels checked more often if:  Your lipid or cholesterol levels are high.  You are older than 65 years of age.  You are at high risk for heart disease.  CANCER SCREENING   Lung Cancer  Lung cancer screening is recommended for adults 4-83 years old who are at high risk for lung cancer because of a history of smoking.  A yearly low-dose CT scan of the lungs is recommended for people who:  Currently smoke.  Have quit within the past 15 years.  Have at least a 30-pack-year history of smoking. A pack year is smoking an average of one pack of cigarettes a day for 1 year.  Yearly screening should continue until it has been 15 years since you quit.  Yearly screening should stop if you develop a health problem that would prevent you from having lung cancer treatment.  Breast Cancer  Practice breast self-awareness. This means understanding how your breasts normally appear and feel.  It also means doing regular breast self-exams. Let your health care provider know about any changes, no matter how small.  If you are in your 20s or 30s, you should have a clinical breast exam (CBE) by a health care provider every 1-3 years as part of a regular health exam.  If you are 87 or older, have a CBE every year. Also consider having a breast  X-ray (mammogram) every year.  If you have a family history of breast cancer, talk to your health care provider about genetic screening.  If you are at high risk for breast cancer, talk to your health care provider about having an MRI and a mammogram every year.  Breast cancer gene (BRCA) assessment is recommended for women who have family members with BRCA-related cancers. BRCA-related cancers include:  Breast.  Ovarian.  Tubal.  Peritoneal cancers.  Results of the assessment will determine the need for genetic counseling and BRCA1 and BRCA2 testing. Cervical Cancer Your  health care provider may recommend that you be screened regularly for cancer of the pelvic organs (ovaries, uterus, and vagina). This screening involves a pelvic examination, including checking for microscopic changes to the surface of your cervix (Pap test). You may be encouraged to have this screening done every 3 years, beginning at age 33.  For women ages 57-65, health care providers may recommend pelvic exams and Pap testing every 3 years, or they may recommend the Pap and pelvic exam, combined with testing for human papilloma virus (HPV), every 5 years. Some types of HPV increase your risk of cervical cancer. Testing for HPV may also be done on women of any age with unclear Pap test results.  Other health care providers may not recommend any screening for nonpregnant women who are considered low risk for pelvic cancer and who do not have symptoms. Ask your health care provider if a screening pelvic exam is right for you.  If you have had past treatment for cervical cancer or a condition that could lead to cancer, you need Pap tests and screening for cancer for at least 20 years after your treatment. If Pap tests have been discontinued, your risk factors (such as having a new sexual partner) need to be reassessed to determine if screening should resume. Some women have medical problems that increase the chance of getting cervical cancer. In these cases, your health care provider may recommend more frequent screening and Pap tests. Colorectal Cancer  This type of cancer can be detected and often prevented.  Routine colorectal cancer screening usually begins at 65 years of age and continues through 65 years of age.  Your health care provider may recommend screening at an earlier age if you have risk factors for colon cancer.  Your health care provider may also recommend using home test kits to check for hidden blood in the stool.  A small camera at the end of a tube can be used to examine your  colon directly (sigmoidoscopy or colonoscopy). This is done to check for the earliest forms of colorectal cancer.  Routine screening usually begins at age 79.  Direct examination of the colon should be repeated every 5-10 years through 65 years of age. However, you may need to be screened more often if early forms of precancerous polyps or small growths are found. Skin Cancer  Check your skin from head to toe regularly.  Tell your health care provider about any new moles or changes in moles, especially if there is a change in a mole's shape or color.  Also tell your health care provider if you have a mole that is larger than the size of a pencil eraser.  Always use sunscreen. Apply sunscreen liberally and repeatedly throughout the day.  Protect yourself by wearing long sleeves, pants, a wide-brimmed hat, and sunglasses whenever you are outside. HEART DISEASE, DIABETES, AND HIGH BLOOD PRESSURE   High blood pressure causes heart  disease and increases the risk of stroke. High blood pressure is more likely to develop in:  People who have blood pressure in the high end of the normal range (130-139/85-89 mm Hg).  People who are overweight or obese.  People who are African American.  If you are 81-6 years of age, have your blood pressure checked every 3-5 years. If you are 69 years of age or older, have your blood pressure checked every year. You should have your blood pressure measured twice--once when you are at a hospital or clinic, and once when you are not at a hospital or clinic. Record the average of the two measurements. To check your blood pressure when you are not at a hospital or clinic, you can use:  An automated blood pressure machine at a pharmacy.  A home blood pressure monitor.  If you are between 58 years and 61 years old, ask your health care provider if you should take aspirin to prevent strokes.  Have regular diabetes screenings. This involves taking a blood sample to  check your fasting blood sugar level.  If you are at a normal weight and have a low risk for diabetes, have this test once every three years after 65 years of age.  If you are overweight and have a high risk for diabetes, consider being tested at a younger age or more often. PREVENTING INFECTION  Hepatitis B  If you have a higher risk for hepatitis B, you should be screened for this virus. You are considered at high risk for hepatitis B if:  You were born in a country where hepatitis B is common. Ask your health care provider which countries are considered high risk.  Your parents were born in a high-risk country, and you have not been immunized against hepatitis B (hepatitis B vaccine).  You have HIV or AIDS.  You use needles to inject street drugs.  You live with someone who has hepatitis B.  You have had sex with someone who has hepatitis B.  You get hemodialysis treatment.  You take certain medicines for conditions, including cancer, organ transplantation, and autoimmune conditions. Hepatitis C  Blood testing is recommended for:  Everyone born from [birth year] through 1965.  Anyone with known risk factors for hepatitis C. Sexually transmitted infections (STIs)  You should be screened for sexually transmitted infections (STIs) including gonorrhea and chlamydia if:  You are sexually active and are younger than 65 years of age.  You are older than 65 years of age and your health care provider tells you that you are at risk for this type of infection.  Your sexual activity has changed since you were last screened and you are at an increased risk for chlamydia or gonorrhea. Ask your health care provider if you are at risk.  If you do not have HIV, but are at risk, it may be recommended that you take a prescription medicine daily to prevent HIV infection. This is called pre-exposure prophylaxis (PrEP). You are considered at risk if:  You are sexually active and do not regularly use  condoms or know the HIV status of your partner(s).  You take drugs by injection.  You are sexually active with a partner who has HIV. Talk with your health care provider about whether you are at high risk of being infected with HIV. If you choose to begin PrEP, you should first be tested for HIV. You should then be tested every 3 months for as long as you are taking  PrEP.  PREGNANCY   If you are premenopausal and you may become pregnant, ask your health care provider about preconception counseling.  If you may become pregnant, take 400 to 800 micrograms (mcg) of folic acid every day.  If you want to prevent pregnancy, talk to your health care provider about birth control (contraception). OSTEOPOROSIS AND MENOPAUSE   Osteoporosis is a disease in which the bones lose minerals and strength with aging. This can result in serious bone fractures. Your risk for osteoporosis can be identified using a bone density scan.  If you are 76 years of age or older, or if you are at risk for osteoporosis and fractures, ask your health care provider if you should be screened.  Ask your health care provider whether you should take a calcium or vitamin D supplement to lower your risk for osteoporosis.  Menopause may have certain physical symptoms and risks.  Hormone replacement therapy may reduce some of these symptoms and risks. Talk to your health care provider about whether hormone replacement therapy is right for you.  HOME CARE INSTRUCTIONS   Schedule regular health, dental, and eye exams.  Stay current with your immunizations.   Do not use any tobacco products including cigarettes, chewing tobacco, or electronic cigarettes.  If you are pregnant, do not drink alcohol.  If you are breastfeeding, limit how much and how often you drink alcohol.  Limit alcohol intake to no more than 1 drink per day for nonpregnant women. One drink equals 12 ounces of beer, 5 ounces of wine, or 1 ounces of hard  liquor.  Do not use street drugs.  Do not share needles.  Ask your health care provider for help if you need support or information about quitting drugs.  Tell your health care provider if you often feel depressed.  Tell your health care provider if you have ever been abused or do not feel safe at home.   This information is not intended to replace advice given to you by your health care provider. Make sure you discuss any questions you have with your health care provider.   Document Released: 07/26/2010 Document Revised: 01/31/2014 Document Reviewed: 12/12/2012 Elsevier Interactive Patient Education [employer].

## 2015-09-11 NOTE — Assessment & Plan Note (Signed)
Immunizations up to date and reminded about yearly flu shot. Mammogram up to date and colonoscopy up to date. Counseled on sun safety/mole surveillance and the dangers of distracted driving. Given 10 year screening recommendations.

## 2015-09-11 NOTE — Assessment & Plan Note (Signed)
BP at goal on triamterene/hctz and checking CMP and adjust as needed.

## 2015-09-11 NOTE — Progress Notes (Signed)
   Subjective:    Patient ID: Megan Valentine, female    DOB: Dec 02, 1950, 65 y.o.   MRN: [id number]  HPI Here for welcome to medicare wellness, no new complaints. Please see A/P for status and treatment of chronic medical problems.   Diet: heart healthy Physical activity: sedentary Depression/mood screen: negative Hearing: intact to whispered voice Visual acuity: grossly normal, performs annual eye exam  ADLs: capable Fall risk: none Home safety: good Cognitive evaluation: intact to orientation, naming, recall and repetition EOL planning: adv directives discussed, in place  I have personally reviewed and have noted 1. The patient's medical and social history - reviewed today no changes 2. Their use of alcohol, tobacco or illicit drugs 3. Their current medications and supplements 4. The patient's functional ability including ADL's, fall risks, home safety risks and hearing or visual impairment. 5. Diet and physical activities 6. Evidence for depression or mood disorders 7. Care team reviewed and updated (available in snapshot)  Review of Systems  Constitutional: Negative for activity change, appetite change, fatigue, fever and unexpected weight change.  HENT: Negative for congestion, ear discharge, ear pain, postnasal drip, rhinorrhea, sinus pressure, sneezing, sore throat and trouble swallowing.   Eyes: Negative.   Respiratory: Negative for cough, chest tightness, shortness of breath and wheezing.   Cardiovascular: Negative for chest pain, palpitations and leg swelling.  Gastrointestinal: Negative.   Musculoskeletal: Positive for arthralgias and myalgias. Negative for back pain, gait problem, neck pain and neck stiffness.  Skin: Negative.   Neurological: Negative.   Psychiatric/Behavioral: Negative.       Objective:   Physical Exam  Constitutional: She is oriented to person, place, and time. She appears well-developed and well-nourished.  HENT:  Head: Normocephalic and  atraumatic.  Right Ear: External ear normal.  Left Ear: External ear normal.  Eyes: EOM are normal.  Neck: Normal range of motion. No JVD present. No thyromegaly present.  Cardiovascular: Normal rate and regular rhythm.   Pulmonary/Chest: Effort normal and breath sounds normal. No respiratory distress. She has no wheezes.  Abdominal: Soft. She exhibits no distension. There is no tenderness.  Musculoskeletal: She exhibits no edema.  Neurological: She is alert and oriented to person, place, and time.  Skin: Skin is warm and dry.  Psychiatric: She has a normal mood and affect.   Vitals:   09/11/15 1303  BP: 118/76  Pulse: 81  Resp: 12  Temp: 98 F (36.7 C)  SpO2: 98%  Weight: 163 lb (73.9 kg)  Height: 5\' 4"  (1.626 m)      Assessment & Plan:

## 2015-09-11 NOTE — Assessment & Plan Note (Signed)
Checking TSH and adjust as needed. Taking synthroid 100 mcg daily.

## 2015-09-11 NOTE — Progress Notes (Signed)
Pre visit review using our clinic review tool, if applicable. No additional management support is needed unless otherwise documented below in the visit note. 

## 2015-09-11 NOTE — Assessment & Plan Note (Signed)
Checking lipid panel, off meds. Goal LDL <130.

## 2015-10-05 ENCOUNTER — Other Ambulatory Visit: Payer: Self-pay | Admitting: Internal Medicine

## 2015-11-03 ENCOUNTER — Other Ambulatory Visit: Payer: Self-pay | Admitting: Internal Medicine

## 2015-11-05 ENCOUNTER — Ambulatory Visit (INDEPENDENT_AMBULATORY_CARE_PROVIDER_SITE_OTHER): Payer: PPO | Admitting: Orthopaedic Surgery

## 2015-11-05 DIAGNOSIS — Z96651 Presence of right artificial knee joint: Secondary | ICD-10-CM | POA: Diagnosis not present

## 2015-11-09 ENCOUNTER — Ambulatory Visit (INDEPENDENT_AMBULATORY_CARE_PROVIDER_SITE_OTHER): Payer: Self-pay | Admitting: Orthopaedic Surgery

## 2015-11-10 ENCOUNTER — Other Ambulatory Visit: Payer: Self-pay | Admitting: Internal Medicine

## 2015-11-26 ENCOUNTER — Other Ambulatory Visit: Payer: Self-pay | Admitting: Internal Medicine

## 2015-12-04 ENCOUNTER — Other Ambulatory Visit: Payer: Self-pay | Admitting: Internal Medicine

## 2015-12-08 ENCOUNTER — Telehealth: Payer: Self-pay | Admitting: Internal Medicine

## 2015-12-08 NOTE — Telephone Encounter (Signed)
Patient had her flu shot at [facility] on 09/2015.  Thank you.

## 2015-12-09 ENCOUNTER — Encounter: Payer: Self-pay | Admitting: Geriatric Medicine

## 2015-12-09 NOTE — Telephone Encounter (Signed)
Documented in chart.

## 2016-01-18 ENCOUNTER — Other Ambulatory Visit: Payer: Self-pay | Admitting: Internal Medicine

## 2016-02-01 ENCOUNTER — Other Ambulatory Visit: Payer: Self-pay | Admitting: Internal Medicine

## 2016-02-27 ENCOUNTER — Other Ambulatory Visit: Payer: Self-pay | Admitting: Internal Medicine

## 2016-03-04 ENCOUNTER — Other Ambulatory Visit: Payer: Self-pay | Admitting: Internal Medicine

## 2016-03-17 ENCOUNTER — Ambulatory Visit (INDEPENDENT_AMBULATORY_CARE_PROVIDER_SITE_OTHER): Payer: PPO | Admitting: Podiatry

## 2016-03-17 ENCOUNTER — Encounter: Payer: Self-pay | Admitting: Podiatry

## 2016-03-17 DIAGNOSIS — L600 Ingrowing nail: Secondary | ICD-10-CM

## 2016-03-17 NOTE — Patient Instructions (Signed)

## 2016-03-17 NOTE — Progress Notes (Signed)
   Subjective:    Patient ID: Megan Valentine, female    DOB: 19-Dec-1950, 66 y.o.   MRN: [id number]  HPI  Chief Complaint  Patient presents with  . Nail Problem    Right; Great Toe; Medial side, sore x 2 weeks.        Review of Systems     Objective:   Physical Exam        Assessment & Plan:

## 2016-03-17 NOTE — Progress Notes (Signed)
Subjective:     Patient ID: Megan Valentine, female   DOB: March 09, 1950, 66 y.o.   MRN: [id number]  HPI patient presents stating the big toe on my right foot has really been bothering me and making it hard for me to walk comfortably or wear shoe gear   Review of Systems  All other systems reviewed and are negative.      Objective:   Physical Exam  Constitutional: She is oriented to person, place, and time.  Cardiovascular: Intact distal pulses.   Musculoskeletal: Normal range of motion.  Neurological: She is oriented to person, place, and time.  Skin: Skin is warm.  Nursing note and vitals reviewed.  neurovascular status intact muscle strength adequate with incurvated medial border right hallux with pain when palpated. There is distal redness but no active drainage noted and patient's found to have good digital perfusion and is well oriented 3     Assessment:     Ingrown toenail deformity right hallux medial border    Plan:     H&P condition reviewed and recommended removal of the corner the nail. Explained procedure and risk and today I infiltrated the right hallux 60 Milligan times like Marcaine mixture remove the medial corner exposed matrix and applied phenol 3 applications 30 seconds followed by alcohol lavage and sterile dressing. Gave instructions on soaks and reappoint

## 2016-03-30 ENCOUNTER — Ambulatory Visit (INDEPENDENT_AMBULATORY_CARE_PROVIDER_SITE_OTHER): Payer: PPO

## 2016-03-30 ENCOUNTER — Encounter (INDEPENDENT_AMBULATORY_CARE_PROVIDER_SITE_OTHER): Payer: Self-pay | Admitting: Orthopedic Surgery

## 2016-03-30 ENCOUNTER — Ambulatory Visit (INDEPENDENT_AMBULATORY_CARE_PROVIDER_SITE_OTHER): Payer: PPO | Admitting: Orthopedic Surgery

## 2016-03-30 VITALS — BP 113/80 | HR 61 | Ht 64.0 in | Wt 157.0 lb

## 2016-03-30 DIAGNOSIS — M25562 Pain in left knee: Secondary | ICD-10-CM

## 2016-03-30 DIAGNOSIS — Z96651 Presence of right artificial knee joint: Secondary | ICD-10-CM

## 2016-03-30 DIAGNOSIS — G8929 Other chronic pain: Secondary | ICD-10-CM

## 2016-03-30 DIAGNOSIS — M1712 Unilateral primary osteoarthritis, left knee: Secondary | ICD-10-CM | POA: Diagnosis not present

## 2016-03-30 MED ORDER — BUPIVACAINE HCL 0.5 % IJ SOLN
3.0000 mL | INTRAMUSCULAR | Status: AC | PRN
  Administered 2016-03-30: 3 mL via INTRA_ARTICULAR

## 2016-03-30 MED ORDER — METHYLPREDNISOLONE ACETATE 40 MG/ML IJ SUSP
80.0000 mg | INTRAMUSCULAR | Status: AC | PRN
  Administered 2016-03-30: 80 mg

## 2016-03-30 MED ORDER — LIDOCAINE HCL 1 % IJ SOLN
5.0000 mL | INTRAMUSCULAR | Status: AC | PRN
  Administered 2016-03-30: 5 mL

## 2016-03-30 NOTE — Progress Notes (Signed)
Office Visit Note   Patient: Megan Valentine           Date of Birth: Jan 07, 1951           MRN: [id number] Visit Date: 03/30/2016              Requested by: Hoyt Koch, MD [place], [place] [id number] PCP: Hoyt Koch, MD   Assessment & Plan: Visit Diagnoses:  1. Chronic pain of left knee   2. Total knee replacement status, right   3. Unilateral primary osteoarthritis, left knee     Plan:  #1: Corticosteroid injection to the left knee was performed #2: No further treatment right knee at this time. #3: We will precertified for Visco supplementation. Once approved and she can follow back up   Follow-Up Instructions: Return if symptoms worsen or fail to improve.   Orders:  Orders Placed This Encounter  Procedures  . Large Joint Injection/Arthrocentesis  . XR KNEE 3 VIEW RIGHT  . XR Knee Complete 4 Views Left   No orders of the defined types were placed in this encounter.     Procedures: Large Joint Inj Date/Time: 03/30/2016 11:03 AM Performed by: Biagio Borg D Authorized by: Biagio Borg D   Consent Given by:  Patient Timeout: prior to procedure the correct patient, procedure, and site was verified   Indications:  Pain and joint swelling Location:  Knee Site:  L knee Prep: patient was prepped and draped in usual sterile fashion   Needle Size:  25 G Needle Length:  1.5 inches Approach:  Anteromedial Ultrasound Guidance: No   Fluoroscopic Guidance: No   Arthrogram: No   Medications:  3 mL bupivacaine 0.5 %; 5 mL lidocaine 1 %; 80 mg methylPREDNISolone acetate 40 MG/ML Aspiration Attempted: No   Patient tolerance:  Patient tolerated the procedure well with no immediate complications      Clinical Data: No additional findings.   Subjective: Chief Complaint  Patient presents with  . Left knee injection    Megan Valentine is a very pleasant 66 year old white female who is seen today for evaluation of her right knee and left  knee. It is been now over a year since she had a right total knee arthroplasty for osteoarthritis has had excellent results. She's very happy with the results so far.  However she is now experiencing left knee pain. She denies any recent history of injury or trauma however her husband who has had a stroke and is in rehabilitation after multiple episodes of pneumonia as well as anaphylaxis and she has been doing a lot of tugging and pulling in trying to help him he is in rehabilitation. She now is complaining of pain and discomfort in the left knee which apparently has been a problem previously which is now exacerbated.    Review of Systems  Constitutional: Negative.   HENT: Negative.   Respiratory: Negative.   Cardiovascular: Negative.   Gastrointestinal: Negative.   Endocrine:       History of hypothyroidism on medication  Genitourinary: Negative.   Skin: Negative.   Neurological: Negative.   Hematological: Negative.   Psychiatric/Behavioral: Negative.      Objective: Vital Signs: BP 113/80   Pulse 61   Ht 5\' 4"  (1.626 m)   Wt 157 lb (71.2 kg)   BMI 26.95 kg/m   Physical Exam  Constitutional: She is oriented to person, place, and time. She appears well-developed and well-nourished.  HENT:  Head: Normocephalic and  atraumatic.  Eyes: EOM are normal. Pupils are equal, round, and reactive to light.  Pulmonary/Chest: Effort normal.  Musculoskeletal:       Right knee: She exhibits no effusion.       Left knee: She exhibits effusion (1+).  Neurological: She is alert and oriented to person, place, and time.  Skin: Skin is warm and dry.  Psychiatric: She has a normal mood and affect. Her behavior is normal. Judgment and thought content normal.    Right Knee Exam   Tenderness  The patient is experiencing no tenderness.     Range of Motion  Extension: 0  Flexion: 120   Tests  Drawer:       Anterior - 1+    Posterior - negative  Other  Scars: present Sensation:  normal Pulse: present Other tests: no effusion present  Comments:  Varus and valgus stressing did not have instability.   Left Knee Exam   Tenderness  The patient is experiencing tenderness in the lateral joint line and medial retinaculum.  Range of Motion  Extension: 0  Flexion: 110   Other  Scars: absent Sensation: normal Pulse: present Effusion: effusion (1+) present  Comments:  Trace patellofemoral crepitance. Little bit of opening with varus and valgus stressing but very good endpoints.      Specialty Comments:  No specialty comments available.  Imaging: No results found.   PMFS History: Patient Active Problem List   Diagnosis Date Noted  . Constipation 03/26/2015  . Degenerative arthritis of knee, bilateral 11/17/2014  . Routine general medical examination at a health care facility 04/25/2014  . Hypothyroidism 08/21/2008  . Hyperlipidemia 08/21/2008  . Essential hypertension 08/21/2008  . MITRAL VALVE PROLAPSE 08/21/2008   Past Medical History:  Diagnosis Date  . Allergy   . Arthritis   . Cataract   . Chronic bronchitis ([place])   . Complication of anesthesia    hard to wake up  . Diverticulosis   . Environmental allergies   . GERD (gastroesophageal reflux disease)   . Hiatal hernia   . History of chicken pox   . Hyperlipidemia   . Hypothyroidism   . IBS (irritable bowel syndrome)   . Migraines   . Mitral valve prolapse   . Rectal prolapse     Family History  Problem Relation Age of Onset  . Stomach cancer Maternal Grandmother   . Hypertension Maternal Grandmother   . Diabetes Maternal Grandmother   . Stroke Maternal Grandmother   . Cancer Maternal Grandmother     Stomach cancer  . Colon cancer Paternal Grandfather   . Heart attack Paternal Grandfather   . Hyperlipidemia Mother   . Diabetes Mother   . Hypertension Mother   . Alzheimer's disease Mother   . Diabetes Father   . Hyperlipidemia Father   . Heart disease Father   . Heart  attack Father   . Stroke Paternal Grandmother     Past Surgical History:  Procedure Laterality Date  . ABDOMINAL HYSTERECTOMY  1999  . APPENDECTOMY  1962  . EYE SURGERY    . JOINT REPLACEMENT     rt knee scope  . REFRACTIVE SURGERY  2000   both eyes  . TOTAL KNEE ARTHROPLASTY Right 03/24/2015   Procedure: TOTAL KNEE ARTHROPLASTY;  Surgeon: [name], MD;  Location: [facility];  Service: Orthopedics;  Laterality: Right;  . TUBAL LIGATION  1977   Social History   Occupational History  . 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 Smoking status: Former Smoker    Types: Cigarettes    Quit date: 03/23/1976  . Smokeless tobacco: Never Used  . Alcohol use No  . Drug use: No  . Sexual activity: Yes    Birth control/ protection: Abstinence

## 2016-04-30 ENCOUNTER — Other Ambulatory Visit: Payer: Self-pay | Admitting: Internal Medicine

## 2016-05-04 ENCOUNTER — Other Ambulatory Visit: Payer: Self-pay | Admitting: Internal Medicine

## 2016-05-04 DIAGNOSIS — Z1231 Encounter for screening mammogram for malignant neoplasm of breast: Secondary | ICD-10-CM

## 2016-06-02 ENCOUNTER — Other Ambulatory Visit: Payer: Self-pay | Admitting: Internal Medicine

## 2016-06-03 ENCOUNTER — Other Ambulatory Visit: Payer: Self-pay | Admitting: Internal Medicine

## 2016-06-07 ENCOUNTER — Ambulatory Visit
Admission: RE | Admit: 2016-06-07 | Discharge: 2016-06-07 | Disposition: A | Discharge status: Home / Self Care | Payer: PPO | Source: Ambulatory Visit | Attending: Internal Medicine | Admitting: Internal Medicine

## 2016-06-07 DIAGNOSIS — Z1231 Encounter for screening mammogram for malignant neoplasm of breast: Secondary | ICD-10-CM

## 2016-06-09 ENCOUNTER — Other Ambulatory Visit: Payer: Self-pay | Admitting: Internal Medicine

## 2016-06-09 DIAGNOSIS — R928 Other abnormal and inconclusive findings on diagnostic imaging of breast: Secondary | ICD-10-CM

## 2016-06-10 ENCOUNTER — Telehealth: Payer: Self-pay | Admitting: Internal Medicine

## 2016-06-10 MED ORDER — NITROFURANTOIN MONOHYD MACRO 100 MG PO CAPS: 100.0000 mg | ORAL_CAPSULE | Freq: Two times a day (BID) | ORAL | 0 refills | Status: DC

## 2016-06-10 NOTE — Telephone Encounter (Signed)
Patient states she has a UTI infection that started this morning.  States spouse is at home with hospice and does not have a sitter today.  States she can not come in.  States she is having burning when she urinates with frequent urination or urge to try to go.  Patient is requesting medication to be sent to her pharmacy.  Patient uses [facility] on [address].

## 2016-06-10 NOTE — Telephone Encounter (Signed)
LVM with med sent in and how to take and to call back if she has ay questions

## 2016-06-10 NOTE — Telephone Encounter (Signed)
Sent in [place], take 1 pill twice a day for 1 week.

## 2016-06-13 ENCOUNTER — Telehealth (INDEPENDENT_AMBULATORY_CARE_PROVIDER_SITE_OTHER): Payer: Self-pay | Admitting: Radiology

## 2016-06-13 NOTE — Telephone Encounter (Signed)
IC patient and advised her that her ins does not require auth for [phone], and it is covered at 80%, and that her OOP costs would be around $300 approx. (does not get paid up front, we will bill her)    Please call patient and schedule Euflexxa injections x3 left knee, buy and bill.  Her husband is in home hospice and she has a specific schedule to work with. Just FYI.  Thanks.

## 2016-06-23 ENCOUNTER — Ambulatory Visit
Admission: RE | Admit: 2016-06-23 | Discharge: 2016-06-23 | Disposition: A | Discharge status: Home / Self Care | Payer: PPO | Source: Ambulatory Visit | Attending: Internal Medicine | Admitting: Internal Medicine

## 2016-06-23 DIAGNOSIS — R922 Inconclusive mammogram: Secondary | ICD-10-CM | POA: Diagnosis not present

## 2016-06-23 DIAGNOSIS — R928 Other abnormal and inconclusive findings on diagnostic imaging of breast: Secondary | ICD-10-CM

## 2016-06-23 DIAGNOSIS — N6001 Solitary cyst of right breast: Secondary | ICD-10-CM | POA: Diagnosis not present

## 2016-07-06 ENCOUNTER — Ambulatory Visit (INDEPENDENT_AMBULATORY_CARE_PROVIDER_SITE_OTHER): Payer: PPO | Admitting: Orthopedic Surgery

## 2016-07-13 ENCOUNTER — Ambulatory Visit (INDEPENDENT_AMBULATORY_CARE_PROVIDER_SITE_OTHER): Payer: PPO | Admitting: Orthopedic Surgery

## 2016-07-18 ENCOUNTER — Other Ambulatory Visit: Payer: Self-pay | Admitting: Internal Medicine

## 2016-07-20 ENCOUNTER — Ambulatory Visit (INDEPENDENT_AMBULATORY_CARE_PROVIDER_SITE_OTHER): Payer: PPO | Admitting: Orthopedic Surgery

## 2016-08-10 ENCOUNTER — Ambulatory Visit (INDEPENDENT_AMBULATORY_CARE_PROVIDER_SITE_OTHER): Payer: PPO | Admitting: Orthopaedic Surgery

## 2016-08-10 ENCOUNTER — Encounter (INDEPENDENT_AMBULATORY_CARE_PROVIDER_SITE_OTHER): Payer: Self-pay | Admitting: Orthopaedic Surgery

## 2016-08-10 VITALS — BP 106/68 | HR 82 | Ht 66.0 in | Wt 150.0 lb

## 2016-08-10 DIAGNOSIS — M7712 Lateral epicondylitis, left elbow: Secondary | ICD-10-CM

## 2016-08-10 MED ORDER — LIDOCAINE HCL (PF) 1 % IJ SOLN
1.0000 mL | INTRAMUSCULAR | Status: AC | PRN
  Administered 2016-08-10: 1 mL

## 2016-08-10 MED ORDER — METHYLPREDNISOLONE ACETATE 40 MG/ML IJ SUSP
40.0000 mg | INTRAMUSCULAR | Status: AC | PRN
  Administered 2016-08-10: 40 mg

## 2016-08-10 NOTE — Progress Notes (Signed)
Office Visit Note   Patient: Megan Valentine           Date of Birth: 1950/08/12           MRN: [id number] Visit Date: 08/10/2016              Requested by: Hoyt Koch, MD [place], [place] [id number] PCP: Hoyt Koch, MD   Assessment & Plan: Visit Diagnoses:  1. Left tennis elbow   Recurrent left tennis elbow  Plan: Repeat cortisone injection lateral epicondylitis left elbow. Return as needed  Follow-Up Instructions: Return if symptoms worsen or fail to improve.   Orders:  No orders of the defined types were placed in this encounter.  No orders of the defined types were placed in this encounter.     Procedures: Hand/UE Inj Date/Time: 08/10/2016 1:30 PM Performed by: [name] Authorized by: [name]   Condition: lateral epicondylitis   Site:  L elbow Needle Size:  27 G Medications:  1 mL lidocaine (PF) 1 %; 40 mg methylPREDNISolone acetate 40 MG/ML     Clinical Data: No additional findings.   Subjective: Megan Valentine was seen in the end of last year for evaluation of left tennis elbow. Cortisone injection was performed with excellent relief of her pain. She recently lost her husband of nearly 49 years after. If time at hospice. She was lifting and pushing and pulling and accordingly as had recurrence of her pain. Discomfort is localized along the lateral aspect of her left elbow.  HPI  Review of Systems   Objective: Vital Signs: BP 106/68   Pulse 82   Ht 5\' 6"  (1.676 m)   Wt 150 lb (68 kg)   BMI 24.21 kg/m   Physical Exam  Ortho Exam left elbow exam with local tenderness over the lateral epi condyle. No ecchymosis. Minimal edema. Neurovascular exam intact distally. Pain With grip in extension but not flexion. Over the lateral epicondyle  Specialty Comments:  No specialty comments available.  Imaging: No results found.   PMFS History: Patient Active Problem List   Diagnosis Date Noted  .  Constipation 03/26/2015  . Degenerative arthritis of knee, bilateral 11/17/2014  . Routine general medical examination at a health care facility 04/25/2014  . Hypothyroidism 08/21/2008  . Hyperlipidemia 08/21/2008  . Essential hypertension 08/21/2008  . MITRAL VALVE PROLAPSE 08/21/2008   Past Medical History:  Diagnosis Date  . Allergy   . Arthritis   . Cataract   . Chronic bronchitis ([place])   . Complication of anesthesia    hard to wake up  . Diverticulosis   . Environmental allergies   . GERD (gastroesophageal reflux disease)   . Hiatal hernia   . History of chicken pox   . Hyperlipidemia   . Hypothyroidism   . IBS (irritable bowel syndrome)   . Migraines   . Mitral valve prolapse   . Rectal prolapse     Family History  Problem Relation Age of Onset  . Stomach cancer Maternal Grandmother   . Hypertension Maternal Grandmother   . Diabetes Maternal Grandmother   . Stroke Maternal Grandmother   . Cancer Maternal Grandmother        Stomach cancer  . Colon cancer Paternal Grandfather   . Heart attack Paternal Grandfather   . Hyperlipidemia Mother   . Diabetes Mother   . Hypertension Mother   . Alzheimer's disease Mother   . Diabetes Father   . Hyperlipidemia Father   .  Heart disease Father   . Heart attack Father   . Stroke Paternal Grandmother     Past Surgical History:  Procedure Laterality Date  . ABDOMINAL HYSTERECTOMY  1999  . APPENDECTOMY  1962  . BREAST CYST ASPIRATION Bilateral   . EYE SURGERY    . JOINT REPLACEMENT     rt knee scope  . REFRACTIVE SURGERY  2000   both eyes  . TOTAL KNEE ARTHROPLASTY Right 03/24/2015   Procedure: TOTAL KNEE ARTHROPLASTY;  Surgeon: [name], MD;  Location: [facility];  Service: Orthopedics;  Laterality: Right;  . TUBAL LIGATION  1977   Social History   Occupational History  . 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 Smoking status: Former Smoker    Types: Cigarettes    Quit date: 03/23/1976  .  Smokeless tobacco: Never Used  . Alcohol use No  . Drug use: No  . Sexual activity: Yes    Birth control/ protection: Abstinence     [name], MD   Note - This record has been created using Editor, commissioning.  Chart creation errors have been sought, but may not always  have been located. Such creation errors do not reflect on  the standard of medical care.

## 2016-08-21 ENCOUNTER — Other Ambulatory Visit: Payer: Self-pay | Admitting: Internal Medicine

## 2016-08-22 ENCOUNTER — Other Ambulatory Visit: Payer: Self-pay | Admitting: Family

## 2016-08-22 NOTE — Telephone Encounter (Signed)
Needs office visit for TSH.

## 2016-08-24 NOTE — Telephone Encounter (Signed)
Pt has an appointment scheduled in September with Dr. Sharlet Salina.

## 2016-08-29 ENCOUNTER — Other Ambulatory Visit: Payer: Self-pay | Admitting: Internal Medicine

## 2016-09-09 ENCOUNTER — Encounter: Payer: Self-pay | Admitting: Nurse Practitioner

## 2016-09-09 ENCOUNTER — Ambulatory Visit (INDEPENDENT_AMBULATORY_CARE_PROVIDER_SITE_OTHER): Payer: PPO | Admitting: Nurse Practitioner

## 2016-09-09 VITALS — BP 102/64 | HR 68 | Temp 97.7°F | Ht 66.0 in | Wt 140.0 lb

## 2016-09-09 DIAGNOSIS — F5102 Adjustment insomnia: Secondary | ICD-10-CM

## 2016-09-09 MED ORDER — TEMAZEPAM 7.5 MG PO CAPS: 7.5000 mg | ORAL_CAPSULE | Freq: Every evening | ORAL | 0 refills | Status: DC | PRN

## 2016-09-09 NOTE — Patient Instructions (Addendum)
Insomnia Insomnia is a sleep disorder that makes it difficult to fall asleep or to stay asleep. Insomnia can cause tiredness (fatigue), low energy, difficulty concentrating, mood swings, and poor performance at work or school. There are three different ways to classify insomnia:  Difficulty falling asleep.  Difficulty staying asleep.  Waking up too early in the morning.  Any type of insomnia can be long-term (chronic) or short-term (acute). Both are common. Short-term insomnia usually lasts for three months or less. Chronic insomnia occurs at least three times a week for longer than three months. What are the causes? Insomnia may be caused by another condition, situation, or substance, such as:  Anxiety.  Certain medicines.  Gastroesophageal reflux disease (GERD) or other gastrointestinal conditions.  Asthma or other breathing conditions.  Restless legs syndrome, sleep apnea, or other sleep disorders.  Chronic pain.  Menopause. This may include hot flashes.  Stroke.  Abuse of alcohol, tobacco, or illegal drugs.  Depression.  Caffeine.  Neurological disorders, such as Alzheimer disease.  An overactive thyroid (hyperthyroidism).  The cause of insomnia may not be known. What increases the risk? Risk factors for insomnia include:  Gender. Women are more commonly affected than men.  Age. Insomnia is more common as you get older.  Stress. This may involve your professional or personal life.  Income. Insomnia is more common in people with lower income.  Lack of exercise.  Irregular work schedule or night shifts.  Traveling between different time zones.  What are the signs or symptoms? If you have insomnia, trouble falling asleep or trouble staying asleep is the main symptom. This may lead to other symptoms, such as:  Feeling fatigued.  Feeling nervous about going to sleep.  Not feeling rested in the morning.  Having trouble concentrating.  Feeling  irritable, anxious, or depressed.  How is this treated? Treatment for insomnia depends on the cause. If your insomnia is caused by an underlying condition, treatment will focus on addressing the condition. Treatment may also include:  Medicines to help you sleep.  Counseling or therapy.  Lifestyle adjustments.  Follow these instructions at home:  Take medicines only as directed by your health care provider.  Keep regular sleeping and waking hours. Avoid naps.  Keep a sleep diary to help you and your health care provider figure out what could be causing your insomnia. Include: ? When you sleep. ? When you wake up during the night. ? How well you sleep. ? How rested you feel the next day. ? Any side effects of medicines you are taking. ? What you eat and drink.  Make your bedroom a comfortable place where it is easy to fall asleep: ? Put up shades or special blackout curtains to block light from outside. ? Use a white noise machine to block noise. ? Keep the temperature cool.  Exercise regularly as directed by your health care provider. Avoid exercising right before bedtime.  Use relaxation techniques to manage stress. Ask your health care provider to suggest some techniques that may work well for you. These may include: ? Breathing exercises. ? Routines to release muscle tension. ? Visualizing peaceful scenes.  Cut back on alcohol, caffeinated beverages, and cigarettes, especially close to bedtime. These can disrupt your sleep.  Do not overeat or eat spicy foods right before bedtime. This can lead to digestive discomfort that can make it hard for you to sleep.  Limit screen use before bedtime. This includes: ? Watching TV. ? Using your smartphone, tablet, and   computer.  Stick to a routine. This can help you fall asleep faster. Try to do a quiet activity, brush your teeth, and go to bed at the same time each night.  Get out of bed if you are still awake after 15 minutes  of trying to sleep. Keep the lights down, but try reading or doing a quiet activity. When you feel sleepy, go back to bed.  Make sure that you drive carefully. Avoid driving if you feel very sleepy.  Keep all follow-up appointments as directed by your health care provider. This is important. Contact a health care provider if:  You are tired throughout the day or have trouble in your daily routine due to sleepiness.  You continue to have sleep problems or your sleep problems get worse. Get help right away if:  You have serious thoughts about hurting yourself or someone else. This information is not intended to replace advice given to you by your health care provider. Make sure you discuss any questions you have with your health care provider. Document Released: 01/08/2000 Document Revised: 06/12/2015 Document Reviewed: 10/11/2013 Elsevier Interactive Patient Education  2018 [employer].   Temazepam tablets or capsules What is this medicine? TEMAZEPAM (te MAZ e pam) is a benzodiazepine. It is used to help you to fall asleep and sleep through the night. This medicine may be used for other purposes; ask your health care provider or pharmacist if you have questions. COMMON BRAND NAME(S): Restoril What should I tell my health care provider before I take this medicine? They need to know if you have any of these conditions: -an alcohol or drug abuse problem -bipolar disorder, depression, psychosis or other mental health condition -kidney disease -liver disease -lung or breathing disease -myasthenia gravis -Parkinson's disease -porphyria -seizures or a history of seizures -suicidal thoughts -an unusual or allergic reaction to temazepam, other benzodiazepines, other medicines, foods, dyes, or preservatives -pregnant or trying to get pregnant -breast-feeding How should I use this medicine? Take this medicine by mouth. It is only for use at bedtime. Follow the directions on the  prescription label. Swallow the tablets or capsules with a drink of water. If it upsets your stomach, take it with food or milk. Do not take your medicine more often than directed. Do not stop taking except on the advice of your doctor or health care professional. A special MedGuide will be given to you by the pharmacist with each prescription and refill. Be sure to read this information carefully each time. Talk to your pediatrician regarding the use of this medicine in children. Special care may be needed. Overdosage: If you think you have taken too much of this medicine contact a poison control center or emergency room at once. NOTE: This medicine is only for you. Do not share this medicine with others. What if I miss a dose? If you miss a dose, take it as soon as you can. If it is almost time for your next dose, take only that dose. Do not take double or extra doses. What may interact with this medicine? Do not take this medicine with any of the following medications: -narcotic medicines for cough -sodium oxybate This medicine may also interact with the following medications: -alcohol -antihistamines for allergy, cough and cold -certain medicines for anxiety or sleep -certain medicines for depression, like amitriptyline, fluoxetine, sertraline -certain medicines for seizures like phenobarbital, primidone -general anesthetics like lidocaine, pramoxine, tetracaine -medicines that relax muscles for surgery -narcotic medicines for pain -phenothiazines like chlorpromazine,  mesoridazine, prochlorperazine, thioridazine This list may not describe all possible interactions. Give your health care provider a list of all the medicines, herbs, non-prescription drugs, or dietary supplements you use. Also tell them if you smoke, drink alcohol, or use illegal drugs. Some items may interact with your medicine. What should I watch for while using this medicine? Tell your doctor or health care professional  if your symptoms do not start to get better or if they get worse. Do not stop taking except on your doctor's advice. You may develop a severe reaction. Your doctor will tell you how much medicine to take. You may get drowsy or dizzy. Do not drive, use machinery, or do anything that needs mental alertness until you know how this medicine affects you. Do not stand or sit up quickly, especially if you are an older patient. This reduces the risk of dizzy or fainting spells. Alcohol may interfere with the effect of this medicine. Avoid alcoholic drinks. If you are taking another medicine that also causes drowsiness, you may have more side effects. Give your health care provider a list of all medicines you use. Your doctor will tell you how much medicine to take. Do not take more medicine than directed. Call emergency for help if you have problems breathing or unusual sleepiness. After taking this medicine for sleep, you may get up out of bed while not being fully awake and do an activity that you do not know you are doing. The next morning, you may have no memory of the event. Activities such as driving a car ("sleep-driving"), making and eating food, talking on the phone, sexual activity, and sleep-walking have been reported. Call your doctor right away if you find out you have done any of these activities. Do not take this medicine if you have used alcohol that evening or before bed or taken another medicine for sleep since your risk of doing these sleep-related activities will be increased. Do not take this medicine unless you are able to stay in bed for a full night (7 to 8 hour) before you must be active again. You may have a decrease in mental alertness the day after use, even if you feel that you are fully awake. Tell your doctor if you will need to perform activities requiring full alertness, such as driving, the next day. Do not stand or sit up quickly after taking this medicine, especially if you are an  older patient. This reduces the risk of dizzy or fainting spells. If you or your family notice any changes in your behavior, such as new or worsening depression, thoughts of harming yourself, anxiety, other unusual or disturbing thoughts, or memory loss, call your doctor right away. After you stop taking this medicine, you may have trouble falling asleep. This is called rebound insomnia. This problem usually goes away on its own after 1 or 2 nights. Women should inform their doctor if they wish to become pregnant or think they might be pregnant. There is a potential for serious side effects to an unborn child. Talk to your health care professional or pharmacist for more information. What side effects may I notice from receiving this medicine? Side effects that you should report to your doctor or health care professional as soon as possible: -allergic reactions like skin rash, itching or hives, swelling of the face, lips, or tongue -breathing problems -confusion -loss of balance or coordination -signs and symptoms of low blood pressure like dizziness; feeling faint or lightheaded, falls; unusually weak  or tired -suicidal thoughts or other mood changes -unusual activities while asleep like driving, eating, making phone calls Side effects that usually do not require medical attention (report to your doctor or health care professional if they continue or are bothersome): -diarrhea -dizziness -nausea, vomiting -tiredness This list may not describe all possible side effects. Call your doctor for medical advice about side effects. You may report side effects to FDA at 1-800-FDA-1088. Where should I keep my medicine? Keep out of the reach of children. This medicine can be abused. Keep your medicine in a safe place to protect it from theft. Do not share this medicine with anyone. Selling or giving away this medicine is dangerous and against the law. This medicine may cause accidental overdose and death  if taken by other adults, children, or pets. Mix any unused medicine with a substance like cat litter or coffee grounds. Then throw the medicine away in a sealed container like a sealed bag or a coffee can with a lid. Do not use the medicine after the expiration date. Store at room temperature below 30 degrees C (86 degrees F). Protect from light. Keep container tightly closed. NOTE: This sheet is a summary. It may not cover all possible information. If you have questions about this medicine, talk to your doctor, pharmacist, or health care provider.  2018 Elsevier/Gold Standard (2014-10-09 23:44:07)

## 2016-09-09 NOTE — Progress Notes (Signed)
Subjective:  Patient ID: Megan Valentine, female    DOB: [birth year]  Age: 66 y.o. MRN: [id number]  CC: Insomnia (cant sleep since spouse pass away 08/04/2016)   Insomnia  Primary symptoms: sleep disturbance, difficulty falling asleep, frequent awakening.  The current episode started one month. The onset quality is sudden. The problem occurs nightly. The problem is unchanged. Exacerbated by: death of husband last month (expected) How many beverages per day that contain caffeine: 0 - 1.  Types of beverages you drink: tea. Nothing relieves the symptoms. Treatments tried: melatonin and benadryl. Typical bedtime:  8-10 P.M..  How long after going to bed to you fall asleep: over an hour.   PMH includes: no depression, family stress or anxiety, no chronic pain. Prior diagnostic workup includes:  No prior workup.   Insomnia related to recent spouse death 09-07-16): expected.  No SI or HI. No ETOH use. Denies need for grief counsel at this time. Uses church support and prayers at this time.  Outpatient Medications Prior to Visit  Medication Sig Dispense Refill  . cholecalciferol (VITAMIN D) 1000 UNITS tablet Take 2,000 Units by mouth daily.     . Cyanocobalamin (VITAMIN B12 PO) Take 1,000 mg by mouth daily. 2500 mg daily    . estradiol (ESTRACE) 0.5 MG tablet Take 1 tablet (0.5 mg total) by mouth daily. 90 tablet 1  . levothyroxine (SYNTHROID, LEVOTHROID) 100 MCG tablet TAKE 1 TABLET BY MOUTH DAILY...NEED OFFICE VISIT 14 tablet 0  . loratadine (CLARITIN) 10 MG tablet Take 10 mg by mouth daily. Reported on 03/11/2015    . meclizine (ANTIVERT) 25 MG tablet Take 25 mg by mouth daily as needed for dizziness.     . methocarbamol (ROBAXIN) 500 MG tablet Take 1 tablet (500 mg total) by mouth every 8 (eight) hours as needed for muscle spasms. 40 tablet 0  . pantoprazole (PROTONIX) 20 MG tablet Take 1 tablet (20 mg total) by mouth daily. Need office visit for further refills 30 tablet 0  . potassium chloride  SA (K-DUR,KLOR-CON) 20 MEQ tablet TAKE 1 TABLET BY MOUTH DAILY 90 tablet 0  . Psyllium (VEGETABLE LAXATIVE PO) Take 11 tablets by mouth at bedtime.    . triamterene-hydrochlorothiazide (MAXZIDE-25) 37.5-25 MG tablet TAKE 1 TABLET BY MOUTH DAILY 90 tablet 1  . nitrofurantoin, macrocrystal-monohydrate, (MACROBID) 100 MG capsule Take 1 capsule (100 mg total) by mouth 2 (two) times daily. 14 capsule 0  . Potassium Chloride ER 20 MEQ TBCR TAKE 1 TABLET BY MOUTH DAILY 90 tablet 0  . estradiol (ESTRACE) 0.5 MG tablet Take 1 tablet (0.5 mg total) by mouth daily. (Patient not taking: Reported on 09/09/2016) 90 tablet 0  . fluticasone (FLONASE) 50 MCG/ACT nasal spray USE 2 SPRAYS IN EACH NOSTRIL EVERY DAY (Patient not taking: Reported on 03/30/2016) 16 g 9  . potassium chloride SA (K-DUR,KLOR-CON) 20 MEQ tablet TAKE 1 TABLET BY MOUTH DAILY (Patient not taking: Reported on 03/30/2016) 90 tablet 0  . PROAIR RESPICLICK 539 (90 BASE) MCG/ACT AEPB INHALE 2 PUFFS EVERY 4 HOURS AS NEEDED FOR SHORTNESS OF BREATH (Patient not taking: Reported on 03/11/2015) 1 each 0   No facility-administered medications prior to visit.     ROS See HPI  Objective:  BP 102/64   Pulse 68   Temp 97.7 F (36.5 C)   Ht 5\' 6"  (1.676 m)   Wt 140 lb (63.5 kg)   SpO2 99%   BMI 22.60 kg/m   BP Readings from Last 3  Encounters:  09/09/16 102/64  08/10/16 106/68  03/30/16 113/80    Wt Readings from Last 3 Encounters:  09/09/16 140 lb (63.5 kg)  08/10/16 150 lb (68 kg)  03/30/16 157 lb (71.2 kg)    Physical Exam  Constitutional: She is oriented to person, place, and time. No distress.  Neck: No thyromegaly present.  Cardiovascular: Normal rate.   Pulmonary/Chest: Effort normal.  Neurological: She is alert and oriented to person, place, and time.  Psychiatric: She has a normal mood and affect. Her behavior is normal. Thought content normal.  Vitals reviewed.   Lab Results  Component Value Date   WBC 11.1 (H) 03/27/2015    HGB 9.9 (L) 03/27/2015   HCT 30.1 (L) 03/27/2015   PLT 284 03/27/2015   GLUCOSE 96 09/11/2015   CHOL 239 (H) 09/11/2015   TRIG 163.0 (H) 09/11/2015   HDL 62.70 09/11/2015   LDLDIRECT 159.0 02/13/2014   LDLCALC 143 (H) 09/11/2015   ALT 11 09/11/2015   AST 13 09/11/2015   NA 141 09/11/2015   K 3.6 09/11/2015   CL 102 09/11/2015   CREATININE 1.11 09/11/2015   BUN 21 09/11/2015   CO2 31 09/11/2015   TSH 0.94 09/11/2015   INR 1.02 03/13/2015    US Breast Ltd Uni Left Inc Axilla  Result Date: 06/23/2016 CLINICAL DATA:  Callback from screening mammogram for possible bilateral breast masses EXAM: 2D DIGITAL DIAGNOSTIC BILATERAL MAMMOGRAM WITH CAD AND ADJUNCT TOMO ULTRASOUND BILATERAL BREAST COMPARISON:  Previous exam(s). ACR Breast Density Category c: The breast tissue is heterogeneously dense, which may obscure small masses. FINDINGS: Cc and MLO spot-compression views with tomosynthesis were performed of the bilateral breasts. There is a cluster of oval, circumscribed, low-density masses within the outer, slightly lower right breast spanning approximately 1 cm. A 12 mm oval, circumscribed, low-density mass is seen within the lower, slightly inner left breast. Mammographic images were processed with CAD. Targeted ultrasound of the lateral right breast demonstrates a cluster of cysts at 8 o'clock, 4 cm from the nipple measuring 11 x 5 x 4 mm, likely corresponding to the mammographic finding. An additional cluster of cysts was seen at 9 o'clock, 4 cm from the nipple measuring 9 x 5 x 9 mm. No suspicious sonographic finding was identified in the area of concern within the lateral right breast. Targeted ultrasound of the inferior left breast demonstrates an oval, circumscribed, anechoic mass at 6 o'clock, 4 cm from the nipple measuring 9 x 6 x 10 mm, consistent with a simple cyst and corresponding to the mass seen mammographically. IMPRESSION: Bilateral breast cysts. No mammographic or sonographic  evidence of malignancy. RECOMMENDATION: Screening mammogram in one year.(Code:SM-B-01Y) I have discussed the findings and recommendations with the patient. Results were also provided in writing at the conclusion of the visit. If applicable, a reminder letter will be sent to the patient regarding the next appointment. BI-RADS CATEGORY  2: Benign. Electronically Signed   By: Pamelia Hoit M.D.   On: 06/23/2016 16:56   US Breast Ltd Uni Right Inc Axilla  Result Date: 06/23/2016 CLINICAL DATA:  Callback from screening mammogram for possible bilateral breast masses EXAM: 2D DIGITAL DIAGNOSTIC BILATERAL MAMMOGRAM WITH CAD AND ADJUNCT TOMO ULTRASOUND BILATERAL BREAST COMPARISON:  Previous exam(s). ACR Breast Density Category c: The breast tissue is heterogeneously dense, which may obscure small masses. FINDINGS: Cc and MLO spot-compression views with tomosynthesis were performed of the bilateral breasts. There is a cluster of oval, circumscribed, low-density masses within the outer,  slightly lower right breast spanning approximately 1 cm. A 12 mm oval, circumscribed, low-density mass is seen within the lower, slightly inner left breast. Mammographic images were processed with CAD. Targeted ultrasound of the lateral right breast demonstrates a cluster of cysts at 8 o'clock, 4 cm from the nipple measuring 11 x 5 x 4 mm, likely corresponding to the mammographic finding. An additional cluster of cysts was seen at 9 o'clock, 4 cm from the nipple measuring 9 x 5 x 9 mm. No suspicious sonographic finding was identified in the area of concern within the lateral right breast. Targeted ultrasound of the inferior left breast demonstrates an oval, circumscribed, anechoic mass at 6 o'clock, 4 cm from the nipple measuring 9 x 6 x 10 mm, consistent with a simple cyst and corresponding to the mass seen mammographically. IMPRESSION: Bilateral breast cysts. No mammographic or sonographic evidence of malignancy. RECOMMENDATION: Screening  mammogram in one year.(Code:SM-B-01Y) I have discussed the findings and recommendations with the patient. Results were also provided in writing at the conclusion of the visit. If applicable, a reminder letter will be sent to the patient regarding the next appointment. BI-RADS CATEGORY  2: Benign. Electronically Signed   By: Pamelia Hoit M.D.   On: 06/23/2016 16:56   Mm Diag Breast Tomo Bilateral  Result Date: 06/23/2016 CLINICAL DATA:  Callback from screening mammogram for possible bilateral breast masses EXAM: 2D DIGITAL DIAGNOSTIC BILATERAL MAMMOGRAM WITH CAD AND ADJUNCT TOMO ULTRASOUND BILATERAL BREAST COMPARISON:  Previous exam(s). ACR Breast Density Category c: The breast tissue is heterogeneously dense, which may obscure small masses. FINDINGS: Cc and MLO spot-compression views with tomosynthesis were performed of the bilateral breasts. There is a cluster of oval, circumscribed, low-density masses within the outer, slightly lower right breast spanning approximately 1 cm. A 12 mm oval, circumscribed, low-density mass is seen within the lower, slightly inner left breast. Mammographic images were processed with CAD. Targeted ultrasound of the lateral right breast demonstrates a cluster of cysts at 8 o'clock, 4 cm from the nipple measuring 11 x 5 x 4 mm, likely corresponding to the mammographic finding. An additional cluster of cysts was seen at 9 o'clock, 4 cm from the nipple measuring 9 x 5 x 9 mm. No suspicious sonographic finding was identified in the area of concern within the lateral right breast. Targeted ultrasound of the inferior left breast demonstrates an oval, circumscribed, anechoic mass at 6 o'clock, 4 cm from the nipple measuring 9 x 6 x 10 mm, consistent with a simple cyst and corresponding to the mass seen mammographically. IMPRESSION: Bilateral breast cysts. No mammographic or sonographic evidence of malignancy. RECOMMENDATION: Screening mammogram in one year.(Code:SM-B-01Y) I have discussed  the findings and recommendations with the patient. Results were also provided in writing at the conclusion of the visit. If applicable, a reminder letter will be sent to the patient regarding the next appointment. BI-RADS CATEGORY  2: Benign. Electronically Signed   By: Pamelia Hoit M.D.   On: 06/23/2016 16:56    Assessment & Plan:   Megan Valentine was seen today for insomnia.  Diagnoses and all orders for this visit:  Adjustment insomnia -     temazepam (RESTORIL) 7.5 MG capsule; Take 1 capsule (7.5 mg total) by mouth at bedtime as needed for sleep.   I have discontinued Ms. Frater's fluticasone, PROAIR RESPICLICK, Potassium Chloride ER, and nitrofurantoin (macrocrystal-monohydrate). I am also having her start on temazepam. Additionally, I am having her maintain her cholecalciferol, loratadine, meclizine, Cyanocobalamin (VITAMIN B12 PO),  Psyllium (VEGETABLE LAXATIVE PO), methocarbamol, estradiol, potassium chloride SA, triamterene-hydrochlorothiazide, levothyroxine, and pantoprazole.  Meds ordered this encounter  Medications  . temazepam (RESTORIL) 7.5 MG capsule    Sig: Take 1 capsule (7.5 mg total) by mouth at bedtime as needed for sleep.    Dispense:  30 capsule    Refill:  0    Order Specific Question:   Supervising Provider    Answer:   Cassandria Anger [[id number]]    Follow-up: Return in about 4 weeks (around 10/07/2016) for insomnia.  Wilfred Lacy, NP

## 2016-09-13 ENCOUNTER — Encounter: Payer: PPO | Admitting: Internal Medicine

## 2016-09-19 ENCOUNTER — Other Ambulatory Visit: Payer: Self-pay | Admitting: Family

## 2016-09-19 DIAGNOSIS — H524 Presbyopia: Secondary | ICD-10-CM | POA: Diagnosis not present

## 2016-09-19 DIAGNOSIS — H40013 Open angle with borderline findings, low risk, bilateral: Secondary | ICD-10-CM | POA: Diagnosis not present

## 2016-09-19 DIAGNOSIS — Z961 Presence of intraocular lens: Secondary | ICD-10-CM | POA: Diagnosis not present

## 2016-09-25 ENCOUNTER — Emergency Department (HOSPITAL_COMMUNITY)
Admission: EM | Admit: 2016-09-25 | Discharge: 2016-09-25 | Disposition: A | Discharge status: Home / Self Care | Payer: PPO | Attending: Emergency Medicine | Admitting: Emergency Medicine

## 2016-09-25 ENCOUNTER — Emergency Department (HOSPITAL_COMMUNITY): Payer: PPO

## 2016-09-25 ENCOUNTER — Encounter (HOSPITAL_COMMUNITY): Payer: Self-pay | Admitting: Emergency Medicine

## 2016-09-25 DIAGNOSIS — E039 Hypothyroidism, unspecified: Secondary | ICD-10-CM | POA: Diagnosis not present

## 2016-09-25 DIAGNOSIS — Z87891 Personal history of nicotine dependence: Secondary | ICD-10-CM | POA: Insufficient documentation

## 2016-09-25 DIAGNOSIS — I10 Essential (primary) hypertension: Secondary | ICD-10-CM | POA: Insufficient documentation

## 2016-09-25 DIAGNOSIS — R079 Chest pain, unspecified: Secondary | ICD-10-CM | POA: Diagnosis not present

## 2016-09-25 DIAGNOSIS — E785 Hyperlipidemia, unspecified: Secondary | ICD-10-CM | POA: Insufficient documentation

## 2016-09-25 DIAGNOSIS — Z79899 Other long term (current) drug therapy: Secondary | ICD-10-CM | POA: Insufficient documentation

## 2016-09-25 DIAGNOSIS — R091 Pleurisy: Secondary | ICD-10-CM

## 2016-09-25 LAB — CBC
HCT: 41.6 % (ref 36.0–46.0)
HEMOGLOBIN: 14 g/dL (ref 12.0–15.0)
MCH: 30.9 pg (ref 26.0–34.0)
MCHC: 33.7 g/dL (ref 30.0–36.0)
MCV: 91.8 fL (ref 78.0–100.0)
PLATELETS: 338 10*3/uL (ref 150–400)
RBC: 4.53 MIL/uL (ref 3.87–5.11)
RDW: 13.5 % (ref 11.5–15.5)
WBC: 10.9 10*3/uL — ABNORMAL HIGH (ref 4.0–10.5)

## 2016-09-25 LAB — BASIC METABOLIC PANEL
ANION GAP: 10 (ref 5–15)
BUN: 19 mg/dL (ref 6–20)
CALCIUM: 9 mg/dL (ref 8.9–10.3)
CO2: 23 mmol/L (ref 22–32)
CREATININE: 1.02 mg/dL — AB (ref 0.44–1.00)
Chloride: 105 mmol/L (ref 101–111)
GFR calc Af Amer: >60 mL/min (ref >60)
GFR calc non Af Amer: 56 mL/min — ABNORMAL LOW (ref >60)
GLUCOSE: 98 mg/dL (ref 65–99)
Potassium: 3 mmol/L — ABNORMAL LOW (ref 3.5–5.1)
Sodium: 138 mmol/L (ref 135–145)

## 2016-09-25 LAB — D-DIMER, QUANTITATIVE: D-Dimer, Quant: 0.49 ug/mL-FEU (ref 0.00–0.50)

## 2016-09-25 LAB — I-STAT TROPONIN, ED: TROPONIN I, POC: 0 ng/mL (ref 0.00–0.08)

## 2016-09-25 MED ORDER — PREDNISONE 50 MG PO TABS: ORAL_TABLET | ORAL | 0 refills | Status: DC

## 2016-09-25 MED ORDER — KETOROLAC TROMETHAMINE 30 MG/ML IJ SOLN
15.0000 mg | Freq: Once | INTRAMUSCULAR | Status: AC
  Administered 2016-09-25: 15 mg via INTRAVENOUS
  Filled 2016-09-25: qty 1

## 2016-09-25 MED ORDER — LORAZEPAM 2 MG/ML IJ SOLN
0.5000 mg | Freq: Once | INTRAMUSCULAR | Status: AC
  Administered 2016-09-25: 0.5 mg via INTRAVENOUS
  Filled 2016-09-25: qty 1

## 2016-09-25 MED ORDER — METHYLPREDNISOLONE SODIUM SUCC 125 MG IJ SOLR
125.0000 mg | Freq: Once | INTRAMUSCULAR | Status: AC
  Administered 2016-09-25: 125 mg via INTRAVENOUS
  Filled 2016-09-25: qty 2

## 2016-09-25 MED ORDER — POTASSIUM CHLORIDE CRYS ER 20 MEQ PO TBCR
40.0000 meq | EXTENDED_RELEASE_TABLET | Freq: Once | ORAL | Status: AC
  Administered 2016-09-25: 40 meq via ORAL
  Filled 2016-09-25: qty 2

## 2016-09-25 MED ORDER — METHOCARBAMOL 500 MG PO TABS: 500.0000 mg | ORAL_TABLET | Freq: Two times a day (BID) | ORAL | 0 refills | Status: DC

## 2016-09-25 NOTE — ED Provider Notes (Signed)
[facility] DEPT Provider Note   CSN: [id number] Arrival date & time: 09/25/16  0755     History   Chief Complaint Chief Complaint  Patient presents with  . Chest Pain  . Shoulder Pain    HPI Megan Valentine is a 66 y.o. female.  66 year old female presents with several days of left-sided aortic chest pain. Denies any associated dyspnea or diaphoresis or nausea vomiting. No prior history of PE. No leg pain or swelling. No recent travel history. Pain better with remaining still. Denies any rashes to her chest. Patient does have a history of pleurisy secondary to bronchitis. And does note recent URI symptoms.      Past Medical History:  Diagnosis Date  . Allergy   . Arthritis   . Cataract   . Chronic bronchitis ([place])   . Complication of anesthesia    hard to wake up  . Diverticulosis   . Environmental allergies   . GERD (gastroesophageal reflux disease)   . Hiatal hernia   . History of chicken pox   . Hyperlipidemia   . Hypothyroidism   . IBS (irritable bowel syndrome)   . Migraines   . Mitral valve prolapse   . Rectal prolapse     Patient Active Problem List   Diagnosis Date Noted  . Constipation 03/26/2015  . Degenerative arthritis of knee, bilateral 11/17/2014  . Routine general medical examination at a health care facility 04/25/2014  . Hypothyroidism 08/21/2008  . Hyperlipidemia 08/21/2008  . Essential hypertension 08/21/2008  . MITRAL VALVE PROLAPSE 08/21/2008    Past Surgical History:  Procedure Laterality Date  . ABDOMINAL HYSTERECTOMY  1999  . APPENDECTOMY  1962  . BREAST CYST ASPIRATION Bilateral   . EYE SURGERY    . JOINT REPLACEMENT     rt knee scope  . REFRACTIVE SURGERY  2000   both eyes  . TOTAL KNEE ARTHROPLASTY Right 03/24/2015   Procedure: TOTAL KNEE ARTHROPLASTY;  Surgeon: [name], MD;  Location: [facility];  Service: Orthopedics;  Laterality: Right;  . TUBAL LIGATION  1977    OB History    No data available        Home Medications    Prior to Admission medications   Medication Sig Start Date End Date Taking? Authorizing Provider  cholecalciferol (VITAMIN D) 1000 UNITS tablet Take 2,000 Units by mouth daily.     [provider]  Cyanocobalamin (VITAMIN B12 PO) Take 1,000 mg by mouth daily. 2500 mg daily    [provider]  estradiol (ESTRACE) 0.5 MG tablet Take 1 tablet (0.5 mg total) by mouth daily. 05/02/16   Hoyt Koch, MD  levothyroxine (SYNTHROID, LEVOTHROID) 100 MCG tablet TAKE 1 TABLET BY MOUTH DAILY...NEED OFFICE VISIT 08/22/16   Golden Circle, FNP  levothyroxine (SYNTHROID, LEVOTHROID) 100 MCG tablet TAKE 1 TABLET BY MOUTH DAILY...NEED OFFICE VISIT 09/19/16   Golden Circle, FNP  loratadine (CLARITIN) 10 MG tablet Take 10 mg by mouth daily. Reported on 03/11/2015    [provider]  meclizine (ANTIVERT) 25 MG tablet Take 25 mg by mouth daily as needed for dizziness.     [provider]  methocarbamol (ROBAXIN) 500 MG tablet Take 1 tablet (500 mg total) by mouth every 8 (eight) hours as needed for muscle spasms. 03/26/15   Cherylann Ratel, PA-C  pantoprazole (PROTONIX) 20 MG tablet Take 1 tablet (20 mg total) by mouth daily. Need office visit for further refills 08/29/16   Pricilla Holm  A, MD  potassium chloride SA (K-DUR,KLOR-CON) 20 MEQ tablet TAKE 1 TABLET BY MOUTH DAILY 06/03/16   Hoyt Koch, MD  Psyllium (VEGETABLE LAXATIVE PO) Take 11 tablets by mouth at bedtime.    [provider]  temazepam (RESTORIL) 7.5 MG capsule Take 1 capsule (7.5 mg total) by mouth at bedtime as needed for sleep. 09/09/16   NcheCharlene Brooke, NP  triamterene-hydrochlorothiazide (MAXZIDE-25) 37.5-25 MG tablet TAKE 1 TABLET BY MOUTH DAILY 07/18/16   Golden Circle, FNP    Family History Family History  Problem Relation Age of Onset  . Stomach cancer Maternal Grandmother   . Hypertension Maternal Grandmother   . Diabetes Maternal  Grandmother   . Stroke Maternal Grandmother   . Cancer Maternal Grandmother        Stomach cancer  . Colon cancer Paternal Grandfather   . Heart attack Paternal Grandfather   . Hyperlipidemia Mother   . Diabetes Mother   . Hypertension Mother   . Alzheimer's disease Mother   . Diabetes Father   . Hyperlipidemia Father   . Heart disease Father   . Heart attack Father   . Stroke Paternal Grandmother     Social History Social History  Substance Use Topics  . Smoking status: Former Smoker    Types: Cigarettes    Quit date: 03/23/1976  . Smokeless tobacco: Never Used  . Alcohol use No     Allergies   Tetracycline   Review of Systems Review of Systems  All other systems reviewed and are negative.    Physical Exam Updated Vital Signs BP 109/72   Pulse 70   Temp 98.3 F (36.8 C) (Oral)   Resp 16   Ht 1.632 m (5' 4.25")   Wt 62.6 kg (138 lb)   SpO2 99%   BMI 23.50 kg/m   Physical Exam  Constitutional: She is oriented to person, place, and time. She appears well-developed and well-nourished.  Non-toxic appearance. No distress.  HENT:  Head: Normocephalic and atraumatic.  Eyes: Pupils are equal, round, and reactive to light. Conjunctivae, EOM and lids are normal.  Neck: Normal range of motion. Neck supple. No tracheal deviation present. No thyroid mass present.  Cardiovascular: Normal rate, regular rhythm and normal heart sounds.  Exam reveals no gallop.   No murmur heard. Pulmonary/Chest: Effort normal and breath sounds normal. No stridor. No respiratory distress. She has no decreased breath sounds. She has no wheezes. She has no rhonchi. She has no rales.  Abdominal: Soft. Normal appearance and bowel sounds are normal. She exhibits no distension. There is no tenderness. There is no rebound and no CVA tenderness.  Musculoskeletal: Normal range of motion. She exhibits no edema or tenderness.  Neurological: She is alert and oriented to person, place, and time. She has  normal strength. No cranial nerve deficit or sensory deficit. GCS eye subscore is 4. GCS verbal subscore is 5. GCS motor subscore is 6.  Skin: Skin is warm and dry. No abrasion and no rash noted.  Psychiatric: She has a normal mood and affect. Her speech is normal and behavior is normal.  Nursing note and vitals reviewed.    ED Treatments / Results  Labs (all labs ordered are listed, but only abnormal results are displayed) Labs Reviewed  BASIC METABOLIC PANEL  CBC  D-DIMER, QUANTITATIVE (NOT AT [facility])  I-STAT TROPONIN, ED    EKG  EKG Interpretation  Date/Time:  Sunday September 25 2016 08:00:45 EDT Ventricular Rate:  78 PR  Interval:  144 QRS Duration: 64 QT Interval:  394 QTC Calculation: 449 R Axis:   57 Text Interpretation:  Normal sinus rhythm Low voltage QRS Borderline ECG No significant change since last tracing Confirmed by Lacretia Leigh ([id number]) on 09/25/2016 8:13:21 AM       Radiology No results found.  Procedures Procedures (including critical care time)  Medications Ordered in ED Medications  LORazepam (ATIVAN) injection 0.5 mg (not administered)     Initial Impression / Assessment and Plan / ED Course  I have reviewed the triage vital signs and the nursing notes.  Pertinent labs & imaging results that were available during my care of the patient were reviewed by me and considered in my medical decision making (see chart for details).     Patient has negative d-dimer. Do not think that she has pulmonary embolism. Mild hyperkalemia noted and will treat with oral potassium. Chest x-ray without acute findings. Suspect that she has pleurisy. Will discharge home  Final Clinical Impressions(s) / ED Diagnoses   Final diagnoses:  None    New Prescriptions New Prescriptions   No medications on file     Lacretia Leigh, MD 09/25/16 1007

## 2016-09-25 NOTE — ED Notes (Signed)
ED Provider at bedside. 

## 2016-09-25 NOTE — ED Triage Notes (Signed)
Pt. Stated, I was awaked at 200 in morning having left side chest pain and going to my left shoulder.

## 2016-09-30 ENCOUNTER — Telehealth: Payer: Self-pay | Admitting: Internal Medicine

## 2016-09-30 NOTE — Telephone Encounter (Signed)
Please advise thanks.

## 2016-09-30 NOTE — Telephone Encounter (Signed)
Patient is requesting a referral to a GYN that Dr.Crawford would recommend. Please advise. Thank you.

## 2016-10-03 ENCOUNTER — Other Ambulatory Visit: Payer: Self-pay | Admitting: Family

## 2016-10-03 NOTE — Telephone Encounter (Signed)
Patient has been informed.

## 2016-10-03 NOTE — Telephone Encounter (Signed)
[facility] is taking new patients and has providers on their website so she can look and find someone she will feel comfortable with.

## 2016-10-17 DIAGNOSIS — N816 Rectocele: Secondary | ICD-10-CM | POA: Diagnosis not present

## 2016-10-17 DIAGNOSIS — Z79890 Hormone replacement therapy (postmenopausal): Secondary | ICD-10-CM | POA: Diagnosis not present

## 2016-10-19 ENCOUNTER — Encounter: Payer: PPO | Admitting: Internal Medicine

## 2016-10-19 ENCOUNTER — Other Ambulatory Visit (INDEPENDENT_AMBULATORY_CARE_PROVIDER_SITE_OTHER): Payer: PPO

## 2016-10-19 ENCOUNTER — Ambulatory Visit (INDEPENDENT_AMBULATORY_CARE_PROVIDER_SITE_OTHER): Payer: PPO | Admitting: Internal Medicine

## 2016-10-19 ENCOUNTER — Encounter: Payer: Self-pay | Admitting: Internal Medicine

## 2016-10-19 VITALS — BP 104/60 | HR 69 | Temp 97.9°F | Ht 64.5 in | Wt 140.0 lb

## 2016-10-19 DIAGNOSIS — Z0000 Encounter for general adult medical examination without abnormal findings: Secondary | ICD-10-CM | POA: Diagnosis not present

## 2016-10-19 DIAGNOSIS — E785 Hyperlipidemia, unspecified: Secondary | ICD-10-CM

## 2016-10-19 DIAGNOSIS — Z23 Encounter for immunization: Secondary | ICD-10-CM | POA: Diagnosis not present

## 2016-10-19 DIAGNOSIS — K5904 Chronic idiopathic constipation: Secondary | ICD-10-CM

## 2016-10-19 DIAGNOSIS — I10 Essential (primary) hypertension: Secondary | ICD-10-CM

## 2016-10-19 DIAGNOSIS — E039 Hypothyroidism, unspecified: Secondary | ICD-10-CM | POA: Diagnosis not present

## 2016-10-19 LAB — LIPID PANEL
CHOLESTEROL: 236 mg/dL — AB (ref 0–200)
HDL: 65.8 mg/dL (ref >39.00)
LDL Cholesterol: 144 mg/dL — ABNORMAL HIGH (ref 0–99)
NonHDL: 170.05
TRIGLYCERIDES: 129 mg/dL (ref 0.0–149.0)
Total CHOL/HDL Ratio: 4
VLDL: 25.8 mg/dL (ref 0.0–40.0)

## 2016-10-19 LAB — COMPREHENSIVE METABOLIC PANEL
ALBUMIN: 4.2 g/dL (ref 3.5–5.2)
ALK PHOS: 63 U/L (ref 39–117)
ALT: 16 U/L (ref 0–35)
AST: 16 U/L (ref 0–37)
BUN: 19 mg/dL (ref 6–23)
CALCIUM: 9.6 mg/dL (ref 8.4–10.5)
CO2: 27 mEq/L (ref 19–32)
CREATININE: 0.93 mg/dL (ref 0.40–1.20)
Chloride: 101 mEq/L (ref 96–112)
GFR: 63.99 mL/min (ref >60.00)
Glucose, Bld: 91 mg/dL (ref 70–99)
POTASSIUM: 3.8 meq/L (ref 3.5–5.1)
SODIUM: 139 meq/L (ref 135–145)
TOTAL PROTEIN: 7.1 g/dL (ref 6.0–8.3)
Total Bilirubin: 1 mg/dL (ref 0.2–1.2)

## 2016-10-19 LAB — CBC
HCT: 42.6 % (ref 36.0–46.0)
Hemoglobin: 14.2 g/dL (ref 12.0–15.0)
MCHC: 33.3 g/dL (ref 30.0–36.0)
MCV: 93.3 fl (ref 78.0–100.0)
Platelets: 327 10*3/uL (ref 150.0–400.0)
RBC: 4.56 Mil/uL (ref 3.87–5.11)
RDW: 14 % (ref 11.5–15.5)
WBC: 6.7 10*3/uL (ref 4.0–10.5)

## 2016-10-19 LAB — TSH: TSH: 1.45 u[IU]/mL (ref 0.35–4.50)

## 2016-10-19 MED ORDER — LINACLOTIDE 290 MCG PO CAPS: 290.0000 ug | ORAL_CAPSULE | Freq: Every day | ORAL | 3 refills | Status: DC

## 2016-10-19 MED ORDER — LEVOTHYROXINE SODIUM 100 MCG PO TABS: ORAL_TABLET | ORAL | 0 refills | Status: DC

## 2016-10-19 MED ORDER — ZOSTER VAC RECOMB ADJUVANTED 50 MCG/0.5ML IM SUSR: 0.5000 mL | Freq: Once | INTRAMUSCULAR | 1 refills | Status: AC

## 2016-10-19 NOTE — Assessment & Plan Note (Signed)
Rx for linzess as she has not tried this yet.

## 2016-10-19 NOTE — Progress Notes (Signed)
   Subjective:    Patient ID: Megan Valentine, female    DOB: Aug 07, 1950, 66 y.o.   MRN: [id number]  HPI Here for medicare wellness and physical, no new complaints. Please see A/P for status and treatment of chronic medical problems.   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 The patient's medical and social history - reviewed today no changes 2. Their use of alcohol, tobacco or illicit drugs 3. Their current medications and supplements 4. The patient's functional ability including ADL's, fall risks, home safety risks and hearing or visual impairment. 5. Diet and physical activities 6. Evidence for depression or mood disorders 7. Care team reviewed and updated (available in snapshot)  Review of Systems  Constitutional: Negative.   HENT: Negative.   Eyes: Negative.   Respiratory: Negative for cough, chest tightness and shortness of breath.   Cardiovascular: Negative for chest pain, palpitations and leg swelling.  Gastrointestinal: Negative for abdominal distention, abdominal pain, constipation, diarrhea, nausea and vomiting.  Musculoskeletal: Negative.   Skin: Negative.   Neurological: Negative.   Psychiatric/Behavioral: Negative.       Objective:   Physical Exam  Constitutional: She is oriented to person, place, and time. She appears well-developed and well-nourished.  HENT:  Head: Normocephalic and atraumatic.  Eyes: EOM are normal.  Neck: Normal range of motion.  Cardiovascular: Normal rate and regular rhythm.   Pulmonary/Chest: Effort normal and breath sounds  normal. No respiratory distress. She has no wheezes. She has no rales.  Abdominal: Soft. Bowel sounds are normal. She exhibits no distension. There is no tenderness. There is no rebound.  Musculoskeletal: She exhibits no edema.  Neurological: She is alert and oriented to person, place, and time. Coordination normal.  Skin: Skin is warm and dry.  Psychiatric: She has a normal mood and affect.   Vitals:   10/19/16 0850  BP: 104/60  Pulse: 69  Temp: 97.9 F (36.6 C)  TempSrc: Oral  SpO2: 99%  Weight: 140 lb (63.5 kg)  Height: 5' 4.5" (1.638 m)      Assessment & Plan:  Flu shot given at visit

## 2016-10-19 NOTE — Assessment & Plan Note (Signed)
Checking lipid panel and adjust as needed for goal LDL <130. Not on meds.

## 2016-10-19 NOTE — Assessment & Plan Note (Signed)
Flu shot given at visit, counseled about shingrix. Pneumonia and tdap up to date. Mammogram and colonoscopy up to date. Counseled about sun safety and mole surveillance. Given 10 year screening recommendations.

## 2016-10-19 NOTE — Assessment & Plan Note (Signed)
Checking TSH and adjust her synthroid 100 mcg daily as needed.

## 2016-10-19 NOTE — Assessment & Plan Note (Signed)
BP at goal on her hctz. Checking CMP and adjust as needed.

## 2016-10-19 NOTE — Patient Instructions (Signed)
We will check the labs today.   We have sent in linzess to try for the constipation.   Health Maintenance, Female Adopting a healthy lifestyle and getting preventive care can go a long way to promote health and wellness. Talk with your health care provider about what schedule of regular examinations is right for you. This is a good chance for you to check in with your provider about disease prevention and staying healthy. In between checkups, there are plenty of things you can do on your own. Experts have done a lot of research about which lifestyle changes and preventive measures are most likely to keep you healthy. Ask your health care provider for more information. Weight and diet Eat a healthy diet  Be sure to include plenty of vegetables, fruits, low-fat dairy products, and lean protein.  Do not eat a lot of foods high in solid fats, added sugars, or salt.  Get regular exercise. This is one of the most important things you can do for your health. ? Most adults should exercise for at least 150 minutes each week. The exercise should increase your heart rate and make you sweat (moderate-intensity exercise). ? Most adults should also do strengthening exercises at least twice a week. This is in addition to the moderate-intensity exercise.  Maintain a healthy weight  Body mass index (BMI) is a measurement that can be used to identify possible weight problems. It estimates body fat based on height and weight. Your health care provider can help determine your BMI and help you achieve or maintain a healthy weight.  For females 45 years of age and older: ? A BMI below 18.5 is considered underweight. ? A BMI of 18.5 to 24.9 is normal. ? A BMI of 25 to 29.9 is considered overweight. ? A BMI of 30 and above is considered obese.  Watch levels of cholesterol and blood lipids  You should start having your blood tested for lipids and cholesterol at 66 years of age, then have this test every 5  years.  You may need to have your cholesterol levels checked more often if: ? Your lipid or cholesterol levels are high. ? You are older than 66 years of age. ? You are at high risk for heart disease.  Cancer screening Lung Cancer  Lung cancer screening is recommended for adults 72-26 years old who are at high risk for lung cancer because of a history of smoking.  A yearly low-dose CT scan of the lungs is recommended for people who: ? Currently smoke. ? Have quit within the past 15 years. ? Have at least a 30-pack-year history of smoking. A pack year is smoking an average of one pack of cigarettes a day for 1 year.  Yearly screening should continue until it has been 15 years since you quit.  Yearly screening should stop if you develop a health problem that would prevent you from having lung cancer treatment.  Breast Cancer  Practice breast self-awareness. This means understanding how your breasts normally appear and feel.  It also means doing regular breast self-exams. Let your health care provider know about any changes, no matter how small.  If you are in your 20s or 30s, you should have a clinical breast exam (CBE) by a health care provider every 1-3 years as part of a regular health exam.  If you are 54 or older, have a CBE every year. Also consider having a breast X-ray (mammogram) every year.  If you have a family  history of breast cancer, talk to your health care provider about genetic screening.  If you are at high risk for breast cancer, talk to your health care provider about having an MRI and a mammogram every year.  Breast cancer gene (BRCA) assessment is recommended for women who have family members with BRCA-related cancers. BRCA-related cancers include: ? Breast. ? Ovarian. ? Tubal. ? Peritoneal cancers.  Results of the assessment will determine the need for genetic counseling and BRCA1 and BRCA2 testing.  Cervical Cancer Your health care provider may  recommend that you be screened regularly for cancer of the pelvic organs (ovaries, uterus, and vagina). This screening involves a pelvic examination, including checking for microscopic changes to the surface of your cervix (Pap test). You may be encouraged to have this screening done every 3 years, beginning at age 83.  For women ages 16-65, health care providers may recommend pelvic exams and Pap testing every 3 years, or they may recommend the Pap and pelvic exam, combined with testing for human papilloma virus (HPV), every 5 years. Some types of HPV increase your risk of cervical cancer. Testing for HPV may also be done on women of any age with unclear Pap test results.  Other health care providers may not recommend any screening for nonpregnant women who are considered low risk for pelvic cancer and who do not have symptoms. Ask your health care provider if a screening pelvic exam is right for you.  If you have had past treatment for cervical cancer or a condition that could lead to cancer, you need Pap tests and screening for cancer for at least 20 years after your treatment. If Pap tests have been discontinued, your risk factors (such as having a new sexual partner) need to be reassessed to determine if screening should resume. Some women have medical problems that increase the chance of getting cervical cancer. In these cases, your health care provider may recommend more frequent screening and Pap tests.  Colorectal Cancer  This type of cancer can be detected and often prevented.  Routine colorectal cancer screening usually begins at 66 years of age and continues through 66 years of age.  Your health care provider may recommend screening at an earlier age if you have risk factors for colon cancer.  Your health care provider may also recommend using home test kits to check for hidden blood in the stool.  A small camera at the end of a tube can be used to examine your colon directly  (sigmoidoscopy or colonoscopy). This is done to check for the earliest forms of colorectal cancer.  Routine screening usually begins at age 43.  Direct examination of the colon should be repeated every 5-10 years through 66 years of age. However, you may need to be screened more often if early forms of precancerous polyps or small growths are found.  Skin Cancer  Check your skin from head to toe regularly.  Tell your health care provider about any new moles or changes in moles, especially if there is a change in a mole's shape or color.  Also tell your health care provider if you have a mole that is larger than the size of a pencil eraser.  Always use sunscreen. Apply sunscreen liberally and repeatedly throughout the day.  Protect yourself by wearing long sleeves, pants, a wide-brimmed hat, and sunglasses whenever you are outside.  Heart disease, diabetes, and high blood pressure  High blood pressure causes heart disease and increases the risk of stroke.  High blood pressure is more likely to develop in: ? People who have blood pressure in the high end of the normal range (130-139/85-89 mm Hg). ? People who are overweight or obese. ? People who are African American.  If you are 48-73 years of age, have your blood pressure checked every 3-5 years. If you are 45 years of age or older, have your blood pressure checked every year. You should have your blood pressure measured twice-once when you are at a hospital or clinic, and once when you are not at a hospital or clinic. Record the average of the two measurements. To check your blood pressure when you are not at a hospital or clinic, you can use: ? An automated blood pressure machine at a pharmacy. ? A home blood pressure monitor.  If you are between 11 years and 45 years old, ask your health care provider if you should take aspirin to prevent strokes.  Have regular diabetes screenings. This involves taking a blood sample to check your  fasting blood sugar level. ? If you are at a normal weight and have a low risk for diabetes, have this test once every three years after 66 years of age. ? If you are overweight and have a high risk for diabetes, consider being tested at a younger age or more often. Preventing infection Hepatitis B  If you have a higher risk for hepatitis B, you should be screened for this virus. You are considered at high risk for hepatitis B if: ? You were born in a country where hepatitis B is common. Ask your health care provider which countries are considered high risk. ? Your parents were born in a high-risk country, and you have not been immunized against hepatitis B (hepatitis B vaccine). ? You have HIV or AIDS. ? You use needles to inject street drugs. ? You live with someone who has hepatitis B. ? You have had sex with someone who has hepatitis B. ? You get hemodialysis treatment. ? You take certain medicines for conditions, including cancer, organ transplantation, and autoimmune conditions.  Hepatitis C  Blood testing is recommended for: ? Everyone born from [birth year] through 1965. ? Anyone with known risk factors for hepatitis C.  Sexually transmitted infections (STIs)  You should be screened for sexually transmitted infections (STIs) including gonorrhea and chlamydia if: ? You are sexually active and are younger than 66 years of age. ? You are older than 66 years of age and your health care provider tells you that you are at risk for this type of infection. ? Your sexual activity has changed since you were last screened and you are at an increased risk for chlamydia or gonorrhea. Ask your health care provider if you are at risk.  If you do not have HIV, but are at risk, it may be recommended that you take a prescription medicine daily to prevent HIV infection. This is called pre-exposure prophylaxis (PrEP). You are considered at risk if: ? You are sexually active and do not regularly use condoms  or know the HIV status of your partner(s). ? You take drugs by injection. ? You are sexually active with a partner who has HIV.  Talk with your health care provider about whether you are at high risk of being infected with HIV. If you choose to begin PrEP, you should first be tested for HIV. You should then be tested every 3 months for as long as you are taking PrEP. Pregnancy  If you  are premenopausal and you may become pregnant, ask your health care provider about preconception counseling.  If you may become pregnant, take 400 to 800 micrograms (mcg) of folic acid every day.  If you want to prevent pregnancy, talk to your health care provider about birth control (contraception). Osteoporosis and menopause  Osteoporosis is a disease in which the bones lose minerals and strength with aging. This can result in serious bone fractures. Your risk for osteoporosis can be identified using a bone density scan.  If you are 76 years of age or older, or if you are at risk for osteoporosis and fractures, ask your health care provider if you should be screened.  Ask your health care provider whether you should take a calcium or vitamin D supplement to lower your risk for osteoporosis.  Menopause may have certain physical symptoms and risks.  Hormone replacement therapy may reduce some of these symptoms and risks. Talk to your health care provider about whether hormone replacement therapy is right for you. Follow these instructions at home:  Schedule regular health, dental, and eye exams.  Stay current with your immunizations.  Do not use any tobacco products including cigarettes, chewing tobacco, or electronic cigarettes.  If you are pregnant, do not drink alcohol.  If you are breastfeeding, limit how much and how often you drink alcohol.  Limit alcohol intake to no more than 1 drink per day for nonpregnant women. One drink equals 12 ounces of beer, 5 ounces of wine, or 1 ounces of hard  liquor.  Do not use street drugs.  Do not share needles.  Ask your health care provider for help if you need support or information about quitting drugs.  Tell your health care provider if you often feel depressed.  Tell your health care provider if you have ever been abused or do not feel safe at home. This information is not intended to replace advice given to you by your health care provider. Make sure you discuss any questions you have with your health care provider. Document Released: 07/26/2010 Document Revised: 06/18/2015 Document Reviewed: 10/14/2014 Elsevier Interactive Patient Education  [employer].

## 2016-10-24 ENCOUNTER — Telehealth: Payer: Self-pay | Admitting: Internal Medicine

## 2016-10-24 NOTE — Telephone Encounter (Signed)
Give 1 week once starting. If not working can stop taking.

## 2016-10-24 NOTE — Telephone Encounter (Signed)
linaclotide (LINZESS) 290 MCG CAPS capsule   Patient states this medication is not working. She does not know what to do. Please advise. Thank you.

## 2016-10-24 NOTE — Telephone Encounter (Signed)
Contacted patient and informed her to give medication 1 week and to stop taking if it does not work. Patient stated understanding.

## 2016-11-04 ENCOUNTER — Ambulatory Visit (INDEPENDENT_AMBULATORY_CARE_PROVIDER_SITE_OTHER): Payer: PPO | Admitting: Orthopaedic Surgery

## 2016-11-10 DIAGNOSIS — N816 Rectocele: Secondary | ICD-10-CM | POA: Diagnosis not present

## 2016-11-16 ENCOUNTER — Other Ambulatory Visit: Payer: Self-pay | Admitting: Internal Medicine

## 2016-11-25 DIAGNOSIS — Z79890 Hormone replacement therapy (postmenopausal): Secondary | ICD-10-CM | POA: Diagnosis not present

## 2016-11-25 DIAGNOSIS — N816 Rectocele: Secondary | ICD-10-CM | POA: Diagnosis not present

## 2016-12-12 ENCOUNTER — Other Ambulatory Visit: Payer: Self-pay | Admitting: Internal Medicine

## 2016-12-13 ENCOUNTER — Encounter: Payer: Self-pay | Admitting: Internal Medicine

## 2016-12-13 ENCOUNTER — Ambulatory Visit: Payer: PPO | Admitting: Internal Medicine

## 2016-12-13 DIAGNOSIS — J0110 Acute frontal sinusitis, unspecified: Secondary | ICD-10-CM

## 2016-12-13 MED ORDER — AMOXICILLIN-POT CLAVULANATE 875-125 MG PO TABS: 1.0000 | ORAL_TABLET | Freq: Two times a day (BID) | ORAL | 0 refills | Status: DC

## 2016-12-13 NOTE — Progress Notes (Signed)
   Subjective:    Patient ID: Megan Valentine, female    DOB: 1950-07-11, 66 y.o.   MRN: [id number]  HPI The patient is a 66 YO female coming in for 1 month of sinus problems. She took an antibiotic left over from oral procedure and took 2-3 days of amoxicillin. This helped temporarily but the symptoms came right back. She is having chills and no fevers. Some nose drainage and sore throat. Some headaches, ear pain, eye pain and itching. Taking zyrtec and nose rinses, doing eye drops saline and allergy medicine. Nothing is helping at all. Overall stable to worse since onset.  Review of Systems  Constitutional: Positive for activity change, appetite change and chills. Negative for fatigue, fever and unexpected weight change.  HENT: Positive for congestion, ear pain, postnasal drip, rhinorrhea, sinus pressure and sore throat. Negative for ear discharge, sinus pain, sneezing, tinnitus, trouble swallowing and voice change.   Eyes: Positive for redness and itching.  Respiratory: Positive for cough. Negative for chest tightness, shortness of breath and wheezing.   Cardiovascular: Negative.   Gastrointestinal: Negative.   Musculoskeletal: Negative for myalgias.  Neurological: Positive for headaches.      Objective:   Physical Exam  Constitutional: She is oriented to person, place, and time. She appears well-developed and well-nourished.  HENT:  Head: Normocephalic and atraumatic.  Oropharynx with redness and clear drainage, nose with swollen turbinates, TMs normal bilaterally, frontal sinus tenderness  Eyes: EOM are normal.  Some redness in the eyes  Neck: Normal range of motion. No thyromegaly present.  Cardiovascular: Normal rate and regular rhythm.  Pulmonary/Chest: Effort normal and breath sounds normal. No respiratory distress. She has no wheezes. She has no rales.  Abdominal: Soft.  Musculoskeletal: She exhibits tenderness.  Lymphadenopathy:    She has no cervical adenopathy.    Neurological: She is alert and oriented to person, place, and time.  Skin: Skin is warm and dry.   Vitals:   12/13/16 0840  BP: 100/70  Pulse: 98  Temp: 97.7 F (36.5 C)  TempSrc: Oral  SpO2: 98%  Weight: 140 lb (63.5 kg)  Height: 5' 4.5" (1.638 m)      Assessment & Plan:

## 2016-12-13 NOTE — Assessment & Plan Note (Signed)
Rx for augmentin, continue zyrtec.

## 2016-12-13 NOTE — Patient Instructions (Signed)
We have sent in the augmentin for the sinuses. Take 1 pill twice a day for 10 days.    

## 2017-01-06 ENCOUNTER — Encounter: Payer: Self-pay | Admitting: Internal Medicine

## 2017-01-06 ENCOUNTER — Ambulatory Visit: Payer: PPO | Admitting: Internal Medicine

## 2017-01-06 DIAGNOSIS — J0111 Acute recurrent frontal sinusitis: Secondary | ICD-10-CM

## 2017-01-06 MED ORDER — AMOXICILLIN-POT CLAVULANATE 875-125 MG PO TABS: 1.0000 | ORAL_TABLET | Freq: Two times a day (BID) | ORAL | 0 refills | Status: DC

## 2017-01-06 NOTE — Progress Notes (Signed)
   Subjective:    Patient ID: Megan Valentine, female    DOB: 07/09/1950, 66 y.o.   MRN: [id number]  HPI The patient is a 66 YO female coming in for sinus infection. Started about 1-2 weeks ago. She just finished a sinus infection and was outside doing yard work and cleaning gutters and mowing and started feeling bad again. She is having some fevers and chills. She does have mild cough but non-productive. She is taking her usual allergy medications and they are not helping. Getting worse over the last week. No energy and cannot do much.   Review of Systems  Constitutional: Positive for activity change, appetite change and chills. Negative for fatigue, fever and unexpected weight change.  HENT: Positive for congestion, postnasal drip, rhinorrhea and sinus pressure. Negative for ear discharge, ear pain, sinus pain, sneezing, sore throat, tinnitus, trouble swallowing and voice change.   Eyes: Negative.   Respiratory: Positive for cough. Negative for chest tightness, shortness of breath and wheezing.   Cardiovascular: Negative.   Gastrointestinal: Negative.   Musculoskeletal: Positive for myalgias.  Neurological: Positive for headaches.      Objective:   Physical Exam  Constitutional: She is oriented to person, place, and time. She appears well-developed and well-nourished.  HENT:  Head: Normocephalic and atraumatic.  Oropharynx with redness and clear drainage, nose with swollen turbinates and yellow crusting, TMs normal bilaterally, frontal sinus tenderness  Eyes: EOM are normal.  Neck: Normal range of motion. No thyromegaly present.  Cardiovascular: Normal rate and regular rhythm.  Pulmonary/Chest: Effort normal and breath sounds normal. No respiratory distress. She has no wheezes. She has no rales.  Abdominal: Soft.  Musculoskeletal: She exhibits no tenderness.  Lymphadenopathy:    She has cervical adenopathy.  Neurological: She is alert and oriented to person, place, and time.  Skin:  Skin is warm and dry.   Vitals:   01/06/17 1108  BP: 100/78  Pulse: 68  Temp: 97.8 F (36.6 C)  TempSrc: Oral  SpO2: 100%  Weight: 146 lb (66.2 kg)  Height: 5' 4.5" (1.638 m)      Assessment & Plan:

## 2017-01-06 NOTE — Assessment & Plan Note (Signed)
Recurrent this time. Rx for augmentin. Continue her claritin.

## 2017-01-06 NOTE — Patient Instructions (Signed)
We have sent in the augmentin to take 1 pill twice a day for 10 days to clear the sinus infection.

## 2017-01-09 ENCOUNTER — Other Ambulatory Visit: Payer: Self-pay

## 2017-01-09 MED ORDER — TRIAMTERENE-HCTZ 37.5-25 MG PO TABS: 1.0000 | ORAL_TABLET | Freq: Every day | ORAL | 1 refills | Status: DC

## 2017-01-16 ENCOUNTER — Other Ambulatory Visit: Payer: Self-pay | Admitting: Internal Medicine

## 2017-01-19 ENCOUNTER — Other Ambulatory Visit: Payer: Self-pay | Admitting: Internal Medicine

## 2017-02-20 ENCOUNTER — Other Ambulatory Visit (INDEPENDENT_AMBULATORY_CARE_PROVIDER_SITE_OTHER): Payer: Self-pay

## 2017-02-20 ENCOUNTER — Telehealth (INDEPENDENT_AMBULATORY_CARE_PROVIDER_SITE_OTHER): Payer: Self-pay | Admitting: Orthopaedic Surgery

## 2017-02-20 ENCOUNTER — Telehealth: Payer: Self-pay | Admitting: Rheumatology

## 2017-02-20 MED ORDER — AMOXICILLIN 500 MG PO TABS: ORAL_TABLET | ORAL | 0 refills | Status: DC

## 2017-02-20 NOTE — Telephone Encounter (Signed)
Patient called stating that she is having dental work done next week and needed a prescription of Amoxicillin to be called in to [facility] on [address].  Patient's CB# [phone]

## 2017-02-20 NOTE — Telephone Encounter (Signed)
Called pt to let her know I sent the Amoxicillin

## 2017-02-20 NOTE — Telephone Encounter (Signed)
Error

## 2017-03-11 ENCOUNTER — Other Ambulatory Visit: Payer: Self-pay | Admitting: Internal Medicine

## 2017-03-17 DIAGNOSIS — N816 Rectocele: Secondary | ICD-10-CM | POA: Diagnosis not present

## 2017-03-17 DIAGNOSIS — R300 Dysuria: Secondary | ICD-10-CM | POA: Diagnosis not present

## 2017-03-30 DIAGNOSIS — H40013 Open angle with borderline findings, low risk, bilateral: Secondary | ICD-10-CM | POA: Diagnosis not present

## 2017-04-25 ENCOUNTER — Other Ambulatory Visit: Payer: Self-pay | Admitting: Internal Medicine

## 2017-06-08 ENCOUNTER — Other Ambulatory Visit: Payer: Self-pay | Admitting: Internal Medicine

## 2017-06-12 ENCOUNTER — Ambulatory Visit (INDEPENDENT_AMBULATORY_CARE_PROVIDER_SITE_OTHER): Payer: PPO | Admitting: Family Medicine

## 2017-06-12 ENCOUNTER — Encounter: Payer: Self-pay | Admitting: Family Medicine

## 2017-06-12 VITALS — BP 110/78 | HR 65 | Temp 97.6°F | Ht 64.5 in | Wt 150.2 lb

## 2017-06-12 DIAGNOSIS — H6121 Impacted cerumen, right ear: Secondary | ICD-10-CM | POA: Diagnosis not present

## 2017-06-12 DIAGNOSIS — J0101 Acute recurrent maxillary sinusitis: Secondary | ICD-10-CM | POA: Diagnosis not present

## 2017-06-12 MED ORDER — AMOXICILLIN-POT CLAVULANATE 875-125 MG PO TABS: 1.0000 | ORAL_TABLET | Freq: Two times a day (BID) | ORAL | 0 refills | Status: DC

## 2017-06-12 NOTE — Progress Notes (Signed)
Subjective:  Patient ID: Megan Valentine, female    DOB: 05-06-1950  Age: 67 y.o. MRN: [id number]  CC: Sinusitis   HPI Dulcie B Zajkowski presents for evaluation of a respiratory tract illness is been going on for 2 weeks now.  She now has right-sided facial pressure over her cheek.  There is associated teeth pain.  There is pain behind her right eye.  Some congestion in her right ear.  She has had a low-grade temperature.  Mild cough but denies wheezing or reactive airway disease.  She has noted some shortness of breath but denies chest pain nausea or vomiting diaphoresis.  She does not smoke.  She has no history of heart disease or congestive heart failure.  She sleeps on one pillow.  Outpatient Medications Prior to Visit  Medication Sig Dispense Refill  . aspirin 81 MG chewable tablet Chew 81 mg daily by mouth.    . cholecalciferol (VITAMIN D) 1000 UNITS tablet Take 2,000 Units by mouth daily.     . Cyanocobalamin (VITAMIN B12 PO) Take 1,000 mg by mouth daily. 2500 mg daily    . estradiol (ESTRACE) 0.5 MG tablet TAKE 1 TABLET(0.5 MG) BY MOUTH DAILY 90 tablet 1  . levothyroxine (SYNTHROID, LEVOTHROID) 100 MCG tablet TAKE 1 TABLET BY MOUTH DAILY 90 tablet 2  . loratadine (CLARITIN) 10 MG tablet Take 10 mg by mouth daily. Reported on 03/11/2015    . meclizine (ANTIVERT) 25 MG tablet Take 25 mg by mouth daily as needed for dizziness.     . methocarbamol (ROBAXIN) 500 MG tablet Take 1 tablet (500 mg total) by mouth every 8 (eight) hours as needed for muscle spasms. 40 tablet 0  . pantoprazole (PROTONIX) 20 MG tablet TAKE 1 TABLET(20 MG) BY MOUTH DAILY 90 tablet 1  . potassium chloride SA (K-DUR,KLOR-CON) 20 MEQ tablet TAKE 1 TABLET BY MOUTH DAILY 90 tablet 1  . Psyllium (VEGETABLE LAXATIVE PO) Take 12 tablets by mouth at bedtime.     . temazepam (RESTORIL) 7.5 MG capsule Take 1 capsule (7.5 mg total) by mouth at bedtime as needed for sleep. 30 capsule 0  . triamterene-hydrochlorothiazide  (MAXZIDE-25) 37.5-25 MG tablet Take 1 tablet by mouth daily. 90 tablet 1  . amoxicillin (AMOXIL) 500 MG tablet Take 4 tablet 1 hour prior to any dental procedure 12 tablet 0  . amoxicillin-clavulanate (AUGMENTIN) 875-125 MG tablet Take 1 tablet by mouth 2 (two) times daily. 20 tablet 0   No facility-administered medications prior to visit.     ROS Review of Systems  Constitutional: Negative for chills, fatigue, fever and unexpected weight change.  HENT: Positive for rhinorrhea, sinus pressure and sinus pain. Negative for ear discharge, trouble swallowing and voice change.   Eyes: Negative for photophobia and visual disturbance.  Respiratory: Positive for shortness of breath. Negative for chest tightness and wheezing.   Cardiovascular: Negative for chest pain, palpitations and leg swelling.  Gastrointestinal: Negative for abdominal distention, abdominal pain, nausea and vomiting.  Endocrine: Negative for heat intolerance.  Genitourinary: Negative for difficulty urinating and frequency.  Musculoskeletal: Negative for arthralgias, gait problem and joint swelling.  Skin: Negative.   Allergic/Immunologic: Negative for immunocompromised state.  Neurological: Negative for weakness and headaches.  Hematological: Does not bruise/bleed easily.  Psychiatric/Behavioral: Negative.     Objective:  BP 110/78   Pulse 65   Temp 97.6 F (36.4 C)   Ht 5' 4.5" (1.638 m)   Wt 150 lb 4 oz (68.2 kg)   SpO2  99%   BMI 25.39 kg/m   BP Readings from Last 3 Encounters:  06/12/17 110/78  01/06/17 100/78  12/13/16 100/70    Wt Readings from Last 3 Encounters:  06/12/17 150 lb 4 oz (68.2 kg)  01/06/17 146 lb (66.2 kg)  12/13/16 140 lb (63.5 kg)    Physical Exam  Constitutional: She is oriented to person, place, and time. She appears well-developed and well-nourished. No distress.  HENT:  Head: Normocephalic and atraumatic.  Right Ear: External ear normal.  Left Ear: External ear normal.  Nose:  Nose normal.  Mouth/Throat: Oropharynx is clear and moist. No oropharyngeal exudate.  Eyes: Pupils are equal, round, and reactive to light. Conjunctivae and EOM are normal. Right eye exhibits no discharge. Left eye exhibits no discharge. No scleral icterus.  Neck: Normal range of motion. No JVD present. No tracheal deviation present. No thyromegaly present.  Cardiovascular: Normal rate, regular rhythm and normal heart sounds.  Pulmonary/Chest: Effort normal and breath sounds normal. No stridor. No respiratory distress. She has no wheezes. She has no rales. She exhibits no tenderness.  Abdominal: Bowel sounds are normal.  Musculoskeletal: She exhibits no edema.       Right lower leg: She exhibits no tenderness and no edema.       Left lower leg: She exhibits no tenderness and no edema.  Neurological: She is alert and oriented to person, place, and time.  Skin: Skin is warm and dry. Capillary refill takes less than 2 seconds. No rash noted. She is not diaphoretic. No erythema. No pallor.  Psychiatric: She has a normal mood and affect. Her behavior is normal.    Lab Results  Component Value Date   WBC 6.7 10/19/2016   HGB 14.2 10/19/2016   HCT 42.6 10/19/2016   PLT 327.0 10/19/2016   GLUCOSE 91 10/19/2016   CHOL 236 (H) 10/19/2016   TRIG 129.0 10/19/2016   HDL 65.80 10/19/2016   LDLDIRECT 159.0 02/13/2014   LDLCALC 144 (H) 10/19/2016   ALT 16 10/19/2016   AST 16 10/19/2016   NA 139 10/19/2016   K 3.8 10/19/2016   CL 101 10/19/2016   CREATININE 0.93 10/19/2016   BUN 19 10/19/2016   CO2 27 10/19/2016   TSH 1.45 10/19/2016   INR 1.02 03/13/2015    Dg Chest 2 View  Result Date: 09/25/2016 CLINICAL DATA:  Acute onset of left-sided chest pain and left shoulder pain that awoke the patient up at 2 o'clock a.m. this morning. EXAM: CHEST  2 VIEW COMPARISON:  03/13/2015, 02/26/2012 dating back to 06/02/2003. FINDINGS: Cardiac silhouette normal in size, unchanged. Thoracic aorta mildly  tortuous, unchanged. Hilar and mediastinal contours otherwise unremarkable. Lungs clear. Bronchovascular markings normal. Pulmonary vascularity normal. No visible pleural effusions. No pneumothorax. Mild thoracic dextroscoliosis as noted previously. IMPRESSION: No acute cardiopulmonary disease.  Stable examination. Electronically Signed   By: Evangeline Dakin M.D.   On: 09/25/2016 09:05    Assessment & Plan:   Taylore was seen today for sinusitis.  Diagnoses and all orders for this visit:  Acute recurrent maxillary sinusitis -     amoxicillin-clavulanate (AUGMENTIN) 875-125 MG tablet; Take 1 tablet by mouth 2 (two) times daily.  Ceruminosis, right   I have discontinued Omara B. Clasby's amoxicillin-clavulanate and amoxicillin. I am also having her start on amoxicillin-clavulanate. Additionally, I am having her maintain her cholecalciferol, loratadine, meclizine, Cyanocobalamin (VITAMIN B12 PO), Psyllium (VEGETABLE LAXATIVE PO), methocarbamol, temazepam, aspirin, triamterene-hydrochlorothiazide, levothyroxine, estradiol, pantoprazole, and potassium chloride SA.  Meds  ordered this encounter  Medications  . amoxicillin-clavulanate (AUGMENTIN) 875-125 MG tablet    Sig: Take 1 tablet by mouth 2 (two) times daily.    Dispense:  20 tablet    Refill:  0   Patient will take her Augmentin for 10 days and use Mucinex.  She will hold her Zyrtec.  Encouraged her to use over-the-counter earwax removal kit for her right ear.  Patient will follow-up here or with her primary of her shortness of breath does not improve  Follow-up: Return if symptoms worsen or fail to improve.  Libby Maw, MD

## 2017-06-16 DIAGNOSIS — N816 Rectocele: Secondary | ICD-10-CM | POA: Diagnosis not present

## 2017-06-16 DIAGNOSIS — N8111 Cystocele, midline: Secondary | ICD-10-CM | POA: Diagnosis not present

## 2017-07-05 ENCOUNTER — Other Ambulatory Visit: Payer: Self-pay | Admitting: Internal Medicine

## 2017-07-07 ENCOUNTER — Other Ambulatory Visit: Payer: Self-pay | Admitting: Internal Medicine

## 2017-07-07 DIAGNOSIS — Z1231 Encounter for screening mammogram for malignant neoplasm of breast: Secondary | ICD-10-CM

## 2017-07-10 ENCOUNTER — Ambulatory Visit
Admission: RE | Admit: 2017-07-10 | Discharge: 2017-07-10 | Disposition: A | Discharge status: Home / Self Care | Payer: PPO | Source: Ambulatory Visit | Attending: Internal Medicine | Admitting: Internal Medicine

## 2017-07-10 DIAGNOSIS — Z1231 Encounter for screening mammogram for malignant neoplasm of breast: Secondary | ICD-10-CM

## 2017-07-11 ENCOUNTER — Ambulatory Visit (INDEPENDENT_AMBULATORY_CARE_PROVIDER_SITE_OTHER): Payer: PPO | Admitting: Orthopaedic Surgery

## 2017-07-11 ENCOUNTER — Encounter (INDEPENDENT_AMBULATORY_CARE_PROVIDER_SITE_OTHER): Payer: Self-pay | Admitting: Orthopaedic Surgery

## 2017-07-11 VITALS — BP 112/76 | HR 73 | Ht 64.0 in | Wt 145.0 lb

## 2017-07-11 DIAGNOSIS — M1712 Unilateral primary osteoarthritis, left knee: Secondary | ICD-10-CM

## 2017-07-11 DIAGNOSIS — M170 Bilateral primary osteoarthritis of knee: Secondary | ICD-10-CM

## 2017-07-11 MED ORDER — LIDOCAINE HCL 1 % IJ SOLN
2.0000 mL | INTRAMUSCULAR | Status: AC | PRN
  Administered 2017-07-11: 2 mL

## 2017-07-11 MED ORDER — METHYLPREDNISOLONE ACETATE 40 MG/ML IJ SUSP
80.0000 mg | INTRAMUSCULAR | Status: AC | PRN
  Administered 2017-07-11: 80 mg

## 2017-07-11 MED ORDER — BUPIVACAINE HCL 0.5 % IJ SOLN
2.0000 mL | INTRAMUSCULAR | Status: AC | PRN
  Administered 2017-07-11: 2 mL via INTRA_ARTICULAR

## 2017-07-11 NOTE — Progress Notes (Signed)
Office Visit Note   Patient: Megan Valentine           Date of Birth: [birth year]           MRN: [id number] Visit Date: 07/11/2017              Requested by: Hoyt Koch, MD [place], [place] [id number] PCP: Hoyt Koch, MD   Assessment & Plan: Visit Diagnoses:  1. Primary osteoarthritis of both knees     Plan: Repeat cortisone injection left knee today.  Has had successful right total knee replacement.  Has had prior films consistent with arthritis of her left knee.  Last injection was in March 2018  Follow-Up Instructions: Return if symptoms worsen or fail to improve.   Orders:  Orders Placed This Encounter  Procedures  . Large Joint Inj: L knee   No orders of the defined types were placed in this encounter.     Procedures: Large Joint Inj: L knee on 07/11/2017 4:26 PM Indications: pain and diagnostic evaluation Details: 25 G 1.5 in needle, anteromedial approach  Arthrogram: No  Medications: 2 mL lidocaine 1 %; 2 mL bupivacaine 0.5 %; 80 mg methylPREDNISolone acetate 40 MG/ML Procedure, treatment alternatives, risks and benefits explained, specific risks discussed. Consent was given by the patient. Patient was prepped and draped in the usual sterile fashion.       Clinical Data: No additional findings.   Subjective: Chief Complaint  Patient presents with  . Left Knee - Pain  . Follow-up    L KNEE PAIN WOULD LIKE INJECTION, LAST INJECTION WAS 03-30-16  Ms. Bouffard has osteoarthritis of both knees.  She has had a successful right total knee replacement.  She does have arthritis in her left knee as well.  She has been treated conservatively with cortisone injections.  The last was March 2018. She has Been very active lately and is having some recurrent pain and wishes to have another cortisone injection.  No injury or trauma.  Some mild episodic swelling HPI  Review of Systems  Constitutional: Negative for fatigue.  HENT: Negative  for ear pain.   Eyes: Negative for pain.  Respiratory: Negative for cough.   Cardiovascular: Negative for leg swelling.  Gastrointestinal: Positive for constipation. Negative for diarrhea.  Genitourinary: Negative for difficulty urinating.  Musculoskeletal: Negative for back pain and neck pain.  Skin: Negative for rash.  Allergic/Immunologic: Negative for food allergies.  Hematological: Bruises/bleeds easily.  Psychiatric/Behavioral: Positive for sleep disturbance.     Objective: Vital Signs: BP 112/76 (BP Location: Left Arm, Patient Position: Sitting, Cuff Size: Normal)   Pulse 73   Ht 5\' 4"  (1.626 m)   Wt 145 lb (65.8 kg)   BMI 24.89 kg/m   Physical Exam  Constitutional: She is oriented to person, place, and time. She appears well-developed and well-nourished.  HENT:  Mouth/Throat: Oropharynx is clear and moist.  Eyes: Pupils are equal, round, and reactive to light. EOM are normal.  Pulmonary/Chest: Effort normal.  Neurological: She is alert and oriented to person, place, and time.  Skin: Skin is warm and dry.  Psychiatric: She has a normal mood and affect. Her behavior is normal.    Ortho Exam awake alert and oriented x3.  Comfortable sitting.  Possible small effusion left knee.  Predominant medial joint pain with slight increased varus with weightbearing.  Lacks just a few degrees to full extension today but flexed over 105 without instability.  No popliteal pain.  No distal edema.  Neurovascular exam intact Specialty Comments:  No specialty comments available.  Imaging: No results found.   PMFS History: Patient Active Problem List   Diagnosis Date Noted  . Ceruminosis, right 06/12/2017  . Acute sinusitis 07/02/2015  . Constipation 03/26/2015  . Degenerative arthritis of knee, bilateral 11/17/2014  . Routine general medical examination at a health care facility 04/25/2014  . Hypothyroidism 08/21/2008  . Hyperlipidemia 08/21/2008  . Essential hypertension  08/21/2008  . MITRAL VALVE PROLAPSE 08/21/2008   Past Medical History:  Diagnosis Date  . Allergy   . Arthritis   . Cataract   . Chronic bronchitis ([place])   . Complication of anesthesia    hard to wake up  . Diverticulosis   . Environmental allergies   . GERD (gastroesophageal reflux disease)   . Hiatal hernia   . History of chicken pox   . Hyperlipidemia   . Hypothyroidism   . IBS (irritable bowel syndrome)   . Migraines   . Mitral valve prolapse   . Rectal prolapse     Family History  Problem Relation Age of Onset  . Stomach cancer Maternal Grandmother   . Hypertension Maternal Grandmother   . Diabetes Maternal Grandmother   . Stroke Maternal Grandmother   . Cancer Maternal Grandmother        Stomach cancer  . Colon cancer Paternal Grandfather   . Heart attack Paternal Grandfather   . Hyperlipidemia Mother   . Diabetes Mother   . Hypertension Mother   . Alzheimer's disease Mother   . Diabetes Father   . Hyperlipidemia Father   . Heart disease Father   . Heart attack Father   . Stroke Paternal Grandmother     Past Surgical History:  Procedure Laterality Date  . ABDOMINAL HYSTERECTOMY  1999  . APPENDECTOMY  1962  . BREAST CYST ASPIRATION Bilateral   . EYE SURGERY    . JOINT REPLACEMENT     rt knee scope  . REFRACTIVE SURGERY  2000   both eyes  . TOTAL KNEE ARTHROPLASTY Right 03/24/2015   Procedure: TOTAL KNEE ARTHROPLASTY;  Surgeon: [name], MD;  Location: [facility];  Service: Orthopedics;  Laterality: Right;  . TUBAL LIGATION  1977   Social History   Occupational History  . Occupation: PROOF READER    Employer: [employer]  Tobacco Use  . Smoking status: Former Smoker    Types: Cigarettes    Last attempt to quit: 03/23/1976    Years since quitting: 41.3  . Smokeless tobacco: Never Used  Substance and Sexual Activity  . Alcohol use: No  . Drug use: No  . Sexual activity: Yes    Birth control/protection: Abstinence

## 2017-07-17 ENCOUNTER — Other Ambulatory Visit: Payer: Self-pay | Admitting: Internal Medicine

## 2017-07-30 ENCOUNTER — Ambulatory Visit (INDEPENDENT_AMBULATORY_CARE_PROVIDER_SITE_OTHER): Payer: PPO

## 2017-07-30 ENCOUNTER — Encounter (HOSPITAL_COMMUNITY): Payer: Self-pay | Admitting: Emergency Medicine

## 2017-07-30 ENCOUNTER — Ambulatory Visit (HOSPITAL_COMMUNITY)
Admission: EM | Admit: 2017-07-30 | Discharge: 2017-07-30 | Disposition: A | Discharge status: Home / Self Care | Payer: PPO | Attending: Family Medicine | Admitting: Family Medicine

## 2017-07-30 ENCOUNTER — Other Ambulatory Visit: Payer: Self-pay

## 2017-07-30 DIAGNOSIS — R091 Pleurisy: Secondary | ICD-10-CM

## 2017-07-30 DIAGNOSIS — R05 Cough: Secondary | ICD-10-CM | POA: Diagnosis not present

## 2017-07-30 MED ORDER — PREDNISONE 50 MG PO TABS: ORAL_TABLET | ORAL | 0 refills | Status: DC

## 2017-07-30 NOTE — ED Triage Notes (Signed)
The patient presented to the [facility] with a complaint of sob, cough and chest wall pain x 3 days. The patient reported a URI 3 weeks ago.

## 2017-07-30 NOTE — ED Provider Notes (Signed)
[facility]    CSN: [id number] Arrival date & time: 07/30/17  1537     History   Chief Complaint Chief Complaint  Patient presents with  . Cough    HPI Megan Valentine is a 67 y.o. female.   HPI  Patient is here with a cough.'s been going on for 3 days.  Today she developed right chest pain.  She is easily tired.  It hurts when she takes a deep breath and with certain movements.  She is coughing up some scant yellow sputum.  No runny nose or stuffy nose, no sore throat.  She states she used to be a smoker and quit some years ago.  She states she is "prone to bronchitis and pleurisy".  She thinks she had some fever and yesterday but did not take her temperature.  No shaking chills.  No nausea or vomiting.  She denies underlying lung disease.  She has no shortness of breath.  She states she has never had a blood clot currently does not have any calf tenderness, leg swelling, diagnosis of cancer.  Past Medical History:  Diagnosis Date  . Allergy   . Arthritis   . Cataract   . Chronic bronchitis ([place])   . Complication of anesthesia    hard to wake up  . Diverticulosis   . Environmental allergies   . GERD (gastroesophageal reflux disease)   . Hiatal hernia   . History of chicken pox   . Hyperlipidemia   . Hypothyroidism   . IBS (irritable bowel syndrome)   . Migraines   . Mitral valve prolapse   . Rectal prolapse     Patient Active Problem List   Diagnosis Date Noted  . Ceruminosis, right 06/12/2017  . Acute sinusitis 07/02/2015  . Constipation 03/26/2015  . Degenerative arthritis of knee, bilateral 11/17/2014  . Routine general medical examination at a health care facility 04/25/2014  . Hypothyroidism 08/21/2008  . Hyperlipidemia 08/21/2008  . Essential hypertension 08/21/2008  . MITRAL VALVE PROLAPSE 08/21/2008    Past Surgical History:  Procedure Laterality Date  . ABDOMINAL HYSTERECTOMY  1999  . APPENDECTOMY  1962  . BREAST CYST ASPIRATION Bilateral    . EYE SURGERY    . JOINT REPLACEMENT     rt knee scope  . REFRACTIVE SURGERY  2000   both eyes  . TOTAL KNEE ARTHROPLASTY Right 03/24/2015   Procedure: TOTAL KNEE ARTHROPLASTY;  Surgeon: [name], MD;  Location: [facility];  Service: Orthopedics;  Laterality: Right;  . TUBAL LIGATION  1977    OB History   None      Home Medications    Prior to Admission medications   Medication Sig Start Date End Date Taking? Authorizing Provider  aspirin 81 MG chewable tablet Chew 81 mg daily by mouth.    [provider]  cholecalciferol (VITAMIN D) 1000 UNITS tablet Take 2,000 Units by mouth daily.     [provider]  Cyanocobalamin (VITAMIN B12 PO) Take 1,000 mg by mouth daily. 2500 mg daily    [provider]  estradiol (ESTRACE) 0.5 MG tablet TAKE 1 TABLET(0.5 MG) BY MOUTH DAILY 07/17/17   Hoyt Koch, MD  levothyroxine (SYNTHROID, LEVOTHROID) 100 MCG tablet TAKE 1 TABLET BY MOUTH DAILY 01/16/17   Hoyt Koch, MD  loratadine (CLARITIN) 10 MG tablet Take 10 mg by mouth daily. Reported on 03/11/2015    [provider]  meclizine (ANTIVERT) 25 MG tablet Take 25 mg  by mouth daily as needed for dizziness.     [provider]  methocarbamol (ROBAXIN) 500 MG tablet Take 1 tablet (500 mg total) by mouth every 8 (eight) hours as needed for muscle spasms. 03/26/15   Biagio Borg D, PA-C  pantoprazole (PROTONIX) 20 MG tablet TAKE 1 TABLET(20 MG) BY MOUTH DAILY 04/25/17   Hoyt Koch, MD  potassium chloride SA (K-DUR,KLOR-CON) 20 MEQ tablet TAKE 1 TABLET BY MOUTH DAILY 06/08/17   Hoyt Koch, MD  predniSONE (DELTASONE) 50 MG tablet Take one a day 07/30/17   Raylene Everts, MD  Psyllium (VEGETABLE LAXATIVE PO) Take 12 tablets by mouth at bedtime.     [provider]  temazepam (RESTORIL) 7.5 MG capsule Take 1 capsule (7.5 mg total) by mouth at bedtime as needed for sleep. 09/09/16   Flossie Buffy, NP    triamterene-hydrochlorothiazide (MAXZIDE-25) 37.5-25 MG tablet TAKE 1 TABLET BY MOUTH DAILY 07/05/17   Hoyt Koch, MD    Family History Family History  Problem Relation Age of Onset  . Stomach cancer Maternal Grandmother   . Hypertension Maternal Grandmother   . Diabetes Maternal Grandmother   . Stroke Maternal Grandmother   . Cancer Maternal Grandmother        Stomach cancer  . Colon cancer Paternal Grandfather   . Heart attack Paternal Grandfather   . Hyperlipidemia Mother   . Diabetes Mother   . Hypertension Mother   . Alzheimer's disease Mother   . Diabetes Father   . Hyperlipidemia Father   . Heart disease Father   . Heart attack Father   . Stroke Paternal Grandmother     Social History Social History   Tobacco Use  . Smoking status: Former Smoker    Types: Cigarettes    Last attempt to quit: 03/23/1976    Years since quitting: 41.3  . Smokeless tobacco: Never Used  Substance Use Topics  . Alcohol use: No  . Drug use: No     Allergies   Tetracycline   Review of Systems Review of Systems  Constitutional: Positive for fatigue and fever. Negative for chills.  HENT: Negative for congestion, dental problem, ear pain and sore throat.   Eyes: Negative for pain and visual disturbance.  Respiratory: Positive for cough. Negative for shortness of breath.   Cardiovascular: Positive for chest pain. Negative for palpitations.  Gastrointestinal: Negative for abdominal pain and vomiting.  Genitourinary: Negative for dysuria and hematuria.  Musculoskeletal: Negative for arthralgias and back pain.  Skin: Negative for color change and rash.  Neurological: Negative for seizures and syncope.  Psychiatric/Behavioral: Negative for dysphoric mood. The patient is nervous/anxious.   All other systems reviewed and are negative.    Physical Exam Triage Vital Signs ED Triage Vitals [07/30/17 1607]  Enc Vitals Group     BP 113/73     Pulse Rate 72     Resp 18      Temp 99.2 F (37.3 C)     Temp Source Oral     SpO2 100 %     Weight      Height      Head Circumference      Peak Flow      Pain Score 7     Pain Loc      Pain Edu?      Excl. in [place]?    No data found.  Updated Vital Signs BP 113/73 (BP Location: Left Arm)   Pulse 72  Temp 99.2 F (37.3 C) (Oral)   Resp 18   SpO2 100%      Physical Exam  Constitutional: She appears well-developed and well-nourished. No distress.  HENT:  Head: Normocephalic and atraumatic.  Right Ear: External ear normal.  Left Ear: External ear normal.  Mouth/Throat: Oropharynx is clear and moist.  No sinus tenderness  Eyes: Pupils are equal, round, and reactive to light. Conjunctivae are normal.  Neck: Normal range of motion.  Cardiovascular: Normal rate, regular rhythm and normal heart sounds.  Pulmonary/Chest: Effort normal. No respiratory distress. She has no wheezes. She has no rales. She exhibits no tenderness.  Few anterior rhonchi  Abdominal: Soft. Bowel sounds are normal. She exhibits no distension.  Musculoskeletal: Normal range of motion. She exhibits no edema.  No calf tenderness, no leg swelling  Neurological: She is alert.  Skin: Skin is warm and dry.  Psychiatric: She has a normal mood and affect. Her behavior is normal.     UC Treatments / Results  Labs (all labs ordered are listed, but only abnormal results are displayed) Labs Reviewed - No data to display  EKG None  Radiology Dg Chest 2 View  Result Date: 07/30/2017 CLINICAL DATA:  Upper respiratory and affection May 2019. Recurrent cough with pain. EXAM: CHEST - 2 VIEW COMPARISON:  September 25, 2016 FINDINGS: No pneumothorax. The heart, hila, and mediastinum are normal. Minimal atelectasis in the bases. No convincing focal infiltrate. No nodule or mass. No other acute abnormalities. IMPRESSION: No active cardiopulmonary disease. Electronically Signed   By: Dorise Bullion III M.D   On: 07/30/2017 17:00     Procedures Procedures (including critical care time)  Medications Ordered in UC Medications - No data to display  Initial Impression / Assessment and Plan / UC Course  I have reviewed the triage vital signs and the nursing notes.  Pertinent labs & imaging results that were available during my care of the patient were reviewed by me and considered in my medical decision making (see chart for details).     I showed patient her x-rays.  They are normal.  She has pain with deep breath.  This is consistent with pleurisy.  Uncertain why she has recurring pleurisy.  May be related to her prior cigarette smoking. Final Clinical Impressions(s) / UC Diagnoses   Final diagnoses:  Pleurisy     Discharge Instructions     Take the prednisone as directed Return as needed    ED Prescriptions    Medication Sig Dispense Auth. Provider   predniSONE (DELTASONE) 50 MG tablet Take one a day 5 tablet Raylene Everts, MD     Controlled Substance Prescriptions [place] Controlled Substance Registry consulted? Not Applicable   Raylene Everts, MD 08/01/17 [phone]

## 2017-07-30 NOTE — Discharge Instructions (Signed)
Take the prednisone as directed Return as needed

## 2017-08-08 DIAGNOSIS — R350 Frequency of micturition: Secondary | ICD-10-CM | POA: Diagnosis not present

## 2017-08-08 DIAGNOSIS — M549 Dorsalgia, unspecified: Secondary | ICD-10-CM | POA: Diagnosis not present

## 2017-08-24 ENCOUNTER — Encounter (INDEPENDENT_AMBULATORY_CARE_PROVIDER_SITE_OTHER): Payer: Self-pay | Admitting: Orthopedic Surgery

## 2017-08-24 ENCOUNTER — Ambulatory Visit (INDEPENDENT_AMBULATORY_CARE_PROVIDER_SITE_OTHER): Payer: PPO | Admitting: Orthopedic Surgery

## 2017-08-24 VITALS — BP 103/70 | HR 64 | Ht 64.0 in | Wt 149.0 lb

## 2017-08-24 DIAGNOSIS — M1712 Unilateral primary osteoarthritis, left knee: Secondary | ICD-10-CM

## 2017-08-24 NOTE — Progress Notes (Signed)
Office Visit Note   Patient: Megan Valentine           Date of Birth: 05-07-1950           MRN: [id number] Visit Date: 08/24/2017              Requested by: Hoyt Koch, MD [place], [place] [id number] PCP: Hoyt Koch, MD   Assessment & Plan: Visit Diagnoses:  1. Unilateral primary osteoarthritis, left knee     Plan:  At this time I feel that repeat cortisone injection is not indicated.  He had at 6 weeks ago and only had a several day benefit from it.  She actually had been on prednisone 2 Dosepaks for pleurisy and poison ivy and she stated during that time the steroids did not have any.  Therefore I think her only other option other than surgery will be to try the gel shots.  We will see if we can get him precertified.  Follow-Up Instructions: Return if symptoms worsen or fail to improve.   Orders:  No orders of the defined types were placed in this encounter.  No orders of the defined types were placed in this encounter.     Procedures: No procedures performed   Clinical Data: No additional findings.   Subjective: Chief Complaint  Patient presents with  . Follow-up    L KNEE PAIN HAD INJECTION 07/11/17 BUT ONLY LASTED 2-3 DAYS. WOULD LIKE TO DISCUSS VISCO     HPI  Megan Valentine is seen today at her request for evaluation of her left knee.  History is that Dr. Durward Fortes has seen her previously on July 11, 2017 at that time instilled a corticosteroid injection which she stated only lasted maybe 2 days at best.  He continues to have problems and pain in the left knee.  She is status post right total knee arthroplasty.  She has had 2 episodes of prednisone Dosepaks for poison ivy as well as pleurisy.  She stated during that time there was no benefit from the steroids for her left knee.  She comes in today requesting a second injection or versus possible precertification for Visco supplementation injections.  Review of Systems  Constitutional:  Negative for fatigue and fever.  HENT: Negative for ear pain.   Eyes: Negative for pain.  Respiratory: Negative for cough and shortness of breath.   Cardiovascular: Negative for leg swelling.  Gastrointestinal: Positive for constipation. Negative for diarrhea.  Genitourinary: Negative for difficulty urinating.  Musculoskeletal: Negative for back pain and neck pain.  Skin: Negative for rash.  Allergic/Immunologic: Negative for food allergies.  Neurological: Positive for weakness. Negative for numbness.  Hematological: Bruises/bleeds easily.  Psychiatric/Behavioral: Positive for sleep disturbance.     Objective: Vital Signs: BP 103/70 (BP Location: Left Arm, Patient Position: Sitting, Cuff Size: Normal)   Pulse 64   Ht 5\' 4"  (1.626 m)   Wt 149 lb (67.6 kg)   BMI 25.58 kg/m   Physical Exam  Constitutional: She is oriented to person, place, and time. She appears well-developed and well-nourished.  HENT:  Mouth/Throat: Oropharynx is clear and moist.  Eyes: Pupils are equal, round, and reactive to light. EOM are normal.  Pulmonary/Chest: Effort normal.  Neurological: She is alert and oriented to person, place, and time.  Skin: Skin is warm and dry.  Psychiatric: She has a normal mood and affect. Her behavior is normal.    Ortho Exam  Exam today reveals pain.  She  is range of motion from about 3 to 5 degrees to about 105 degrees.   Specialty Comments:  No specialty comments available.  Imaging: No results found.   PMFS History: Current Outpatient Medications  Medication Sig Dispense Refill  . aspirin 81 MG chewable tablet Chew 81 mg daily by mouth.    . cholecalciferol (VITAMIN D) 1000 UNITS tablet Take 2,000 Units by mouth daily.     . Cyanocobalamin (VITAMIN B12 PO) Take 1,000 mg by mouth daily. 2500 mg daily    . estradiol (ESTRACE) 0.5 MG tablet TAKE 1 TABLET(0.5 MG) BY MOUTH DAILY 90 tablet 0  . levothyroxine (SYNTHROID, LEVOTHROID) 100 MCG tablet TAKE 1 TABLET BY  MOUTH DAILY 90 tablet 2  . loratadine (CLARITIN) 10 MG tablet Take 10 mg by mouth daily. Reported on 03/11/2015    . meclizine (ANTIVERT) 25 MG tablet Take 25 mg by mouth daily as needed for dizziness.     . methocarbamol (ROBAXIN) 500 MG tablet Take 1 tablet (500 mg total) by mouth every 8 (eight) hours as needed for muscle spasms. 40 tablet 0  . pantoprazole (PROTONIX) 20 MG tablet TAKE 1 TABLET(20 MG) BY MOUTH DAILY 90 tablet 1  . potassium chloride SA (K-DUR,KLOR-CON) 20 MEQ tablet TAKE 1 TABLET BY MOUTH DAILY 90 tablet 1  . Psyllium (VEGETABLE LAXATIVE PO) Take 12 tablets by mouth at bedtime.     . temazepam (RESTORIL) 7.5 MG capsule Take 1 capsule (7.5 mg total) by mouth at bedtime as needed for sleep. 30 capsule 0  . triamterene-hydrochlorothiazide (MAXZIDE-25) 37.5-25 MG tablet TAKE 1 TABLET BY MOUTH DAILY 90 tablet 0  . predniSONE (DELTASONE) 50 MG tablet Take one a day (Patient not taking: Reported on 08/24/2017) 5 tablet 0   No current facility-administered medications for this visit.     Patient Active Problem List   Diagnosis Date Noted  . Ceruminosis, right 06/12/2017  . Acute sinusitis 07/02/2015  . Constipation 03/26/2015  . Degenerative arthritis of knee, bilateral 11/17/2014  . Routine general medical examination at a health care facility 04/25/2014  . Hypothyroidism 08/21/2008  . Hyperlipidemia 08/21/2008  . Essential hypertension 08/21/2008  . MITRAL VALVE PROLAPSE 08/21/2008   Past Medical History:  Diagnosis Date  . Allergy   . Arthritis   . Cataract   . Chronic bronchitis ([place])   . Complication of anesthesia    hard to wake up  . Diverticulosis   . Environmental allergies   . GERD (gastroesophageal reflux disease)   . Hiatal hernia   . History of chicken pox   . Hyperlipidemia   . Hypothyroidism   . IBS (irritable bowel syndrome)   . Migraines   . Mitral valve prolapse   . Rectal prolapse     Family History  Problem Relation Age of Onset  . Stomach  cancer Maternal Grandmother   . Hypertension Maternal Grandmother   . Diabetes Maternal Grandmother   . Stroke Maternal Grandmother   . Cancer Maternal Grandmother        Stomach cancer  . Colon cancer Paternal Grandfather   . Heart attack Paternal Grandfather   . Hyperlipidemia Mother   . Diabetes Mother   . Hypertension Mother   . Alzheimer's disease Mother   . Diabetes Father   . Hyperlipidemia Father   . Heart disease Father   . Heart attack Father   . Stroke Paternal Grandmother     Past Surgical History:  Procedure Laterality Date  . ABDOMINAL HYSTERECTOMY  1999  . APPENDECTOMY  1962  . BREAST CYST ASPIRATION Bilateral   . EYE SURGERY    . JOINT REPLACEMENT     rt knee scope  . REFRACTIVE SURGERY  2000   both eyes  . TOTAL KNEE ARTHROPLASTY Right 03/24/2015   Procedure: TOTAL KNEE ARTHROPLASTY;  Surgeon: [name], MD;  Location: [facility];  Service: Orthopedics;  Laterality: Right;  . TUBAL LIGATION  1977   Social History   Occupational History  . Occupation: PROOF READER    Employer: [employer]  Tobacco Use  . Smoking status: Former Smoker    Types: Cigarettes    Last attempt to quit: 03/23/1976    Years since quitting: 41.4  . Smokeless tobacco: Never Used  Substance and Sexual Activity  . Alcohol use: No  . Drug use: No  . Sexual activity: Yes    Birth control/protection: Abstinence

## 2017-09-06 ENCOUNTER — Telehealth (INDEPENDENT_AMBULATORY_CARE_PROVIDER_SITE_OTHER): Payer: Self-pay

## 2017-09-06 NOTE — Telephone Encounter (Signed)
Submitted VOB for Orthovisc series, left knee. 

## 2017-09-12 ENCOUNTER — Telehealth (INDEPENDENT_AMBULATORY_CARE_PROVIDER_SITE_OTHER): Payer: Self-pay | Admitting: Orthopaedic Surgery

## 2017-09-12 ENCOUNTER — Telehealth (INDEPENDENT_AMBULATORY_CARE_PROVIDER_SITE_OTHER): Payer: Self-pay

## 2017-09-12 NOTE — Telephone Encounter (Signed)
Patient called checking the status of her gel injections.

## 2017-09-12 NOTE — Telephone Encounter (Signed)
Patient is approved for Orthovisc series, left knee. [place] Patient will be responsible for 20% OOP No Co-pay No PA required  Please schedule patient an appointments with Dr. Durward Fortes.  Thank You.

## 2017-09-12 NOTE — Telephone Encounter (Signed)
Please advise. Thank you

## 2017-09-12 NOTE — Telephone Encounter (Signed)
Message sent to [facility].to call and schedule appointment

## 2017-09-21 DIAGNOSIS — N8111 Cystocele, midline: Secondary | ICD-10-CM | POA: Diagnosis not present

## 2017-09-21 DIAGNOSIS — N816 Rectocele: Secondary | ICD-10-CM | POA: Diagnosis not present

## 2017-09-21 NOTE — Telephone Encounter (Signed)
LMOM to schedule injections for her left knee.

## 2017-09-27 ENCOUNTER — Encounter (INDEPENDENT_AMBULATORY_CARE_PROVIDER_SITE_OTHER): Payer: Self-pay | Admitting: Orthopedic Surgery

## 2017-09-27 ENCOUNTER — Ambulatory Visit (INDEPENDENT_AMBULATORY_CARE_PROVIDER_SITE_OTHER): Payer: PPO | Admitting: Orthopedic Surgery

## 2017-09-27 ENCOUNTER — Ambulatory Visit (INDEPENDENT_AMBULATORY_CARE_PROVIDER_SITE_OTHER): Payer: Self-pay

## 2017-09-27 DIAGNOSIS — G8929 Other chronic pain: Secondary | ICD-10-CM | POA: Diagnosis not present

## 2017-09-27 DIAGNOSIS — M1712 Unilateral primary osteoarthritis, left knee: Secondary | ICD-10-CM | POA: Diagnosis not present

## 2017-09-27 DIAGNOSIS — M25562 Pain in left knee: Secondary | ICD-10-CM

## 2017-09-27 MED ORDER — HYALURONAN 30 MG/2ML IX SOSY
30.0000 mg | PREFILLED_SYRINGE | INTRA_ARTICULAR | Status: AC | PRN
  Administered 2017-09-27: 30 mg via INTRA_ARTICULAR

## 2017-09-27 MED ORDER — LIDOCAINE HCL 1 % IJ SOLN
2.0000 mL | INTRAMUSCULAR | Status: AC | PRN
  Administered 2017-09-27: 2 mL

## 2017-09-27 NOTE — Progress Notes (Signed)
Office Visit Note   Patient: Megan Valentine           Date of Birth: 1950/08/28           MRN: [id number] Visit Date: 09/27/2017              Requested by: Hoyt Koch, MD [place], [place] [id number] PCP: Hoyt Koch, MD   Assessment & Plan: Visit Diagnoses:  1. Unilateral primary osteoarthritis, left knee   2. Chronic pain of left knee     Plan:  #1: Orthovisc injection #1 to the left knee was given without difficulty #2: Follow back up in 1 week for the second Orthovisc injections to the left knee  Follow-Up Instructions: Return in about 1 week (around 10/04/2017).   Orders:  Orders Placed This Encounter  Procedures  . XR KNEE 3 VIEW LEFT   No orders of the defined types were placed in this encounter.     Procedures: Large Joint Inj: L knee on 09/27/2017 11:15 AM Indications: pain and joint swelling Details: 25 G 1.5 in needle, anteromedial approach  Arthrogram: No  Medications: 2 mL lidocaine 1 %; 30 mg Hyaluronan 30 MG/2ML Outcome: tolerated well, no immediate complications Procedure, treatment alternatives, risks and benefits explained, specific risks discussed. Consent was given by the patient. Immediately prior to procedure a time out was called to verify the correct patient, procedure, equipment, support staff and site/side marked as required. Patient was prepped and draped in the usual sterile fashion.       Clinical Data: No additional findings.   Subjective: Chief Complaint  Patient presents with  . Left Knee - Pain    HPI  Megan Valentine is [age]-year-old white female who is seen today for evaluation and injection of the left knee with Orthovisc.  She has seen Dr. Durward Fortes in June 2019 at that time instilled a corticosteroid injection which she states may have lasted about 2 days.  Continue to have pain discomfort in her left knee.  She had 2 Dosepaks for poison ivy and pleurisy which if it to her left knee.  She was seen  back on August 24, 2017 at that time because of the lack of response with the corticosteroid we had then discussed other options including that of Viscosupplementation.  At that time she chose to take that route and she has been finally certified for Visco supplementation.  Review of Systems  Constitutional: Negative for fatigue and fever.  HENT: Negative for ear pain.   Eyes: Negative for pain.  Respiratory: Negative for cough and shortness of breath.   Cardiovascular: Negative for leg swelling.  Gastrointestinal: Positive for constipation. Negative for diarrhea.  Genitourinary: Negative for difficulty urinating.  Musculoskeletal: Negative for back pain and neck pain.  Skin: Negative for rash.  Allergic/Immunologic: Negative for food allergies.  Neurological: Positive for weakness. Negative for numbness.  Hematological: Bruises/bleeds easily.  Psychiatric/Behavioral: Positive for sleep disturbance.     Objective: Vital Signs: There were no vitals taken for this visit.  Physical Exam  Constitutional: She is oriented to person, place, and time. She appears well-developed and well-nourished.  HENT:  Mouth/Throat: Oropharynx is clear and moist.  Eyes: Pupils are equal, round, and reactive to light. EOM are normal.  Pulmonary/Chest: Effort normal.  Neurological: She is alert and oriented to person, place, and time.  Skin: Skin is warm and dry.  Psychiatric: She has a normal mood and affect. Her behavior is normal.  Ortho Exam  Exam today reveals range of motion where she lacks a few degrees of full extension.  I get her to about 105 degrees.  I do not really see much of an effusion.  No warmth or erythema.  She does have some crepitance with range of motion.  Some pain over the medial joint line also.  She also has had some pain with pain of the patella.   Specialty Comments:  No specialty comments available.  Imaging: Xr Knee 3 View Left  Result Date: 09/27/2017 Three-view  x-ray of the left knee reveals patellofemoral joint space narrowing of the lateral compartment.  Periarticular spurring is more prominent medial femoral condyle than the lateral at this time.  She does have varus positioning of the knee with narrowing of the medial compartment but still maintaining a joint space.  She has cystic changes in the distal medial femoral condyle and sclerosing over the lateral tibial I am sorry medial tibial plateau.  There is some stippling in the area of the tubercle.  Peaked spines.    PMFS History: Current Outpatient Medications  Medication Sig Dispense Refill  . aspirin 81 MG chewable tablet Chew 81 mg daily by mouth.    . cholecalciferol (VITAMIN D) 1000 UNITS tablet Take 2,000 Units by mouth daily.     . Cyanocobalamin (VITAMIN B12 PO) Take 1,000 mg by mouth daily. 2500 mg daily    . estradiol (ESTRACE) 0.5 MG tablet TAKE 1 TABLET(0.5 MG) BY MOUTH DAILY 90 tablet 0  . levothyroxine (SYNTHROID, LEVOTHROID) 100 MCG tablet TAKE 1 TABLET BY MOUTH DAILY 90 tablet 2  . loratadine (CLARITIN) 10 MG tablet Take 10 mg by mouth daily. Reported on 03/11/2015    . meclizine (ANTIVERT) 25 MG tablet Take 25 mg by mouth daily as needed for dizziness.     . methocarbamol (ROBAXIN) 500 MG tablet Take 1 tablet (500 mg total) by mouth every 8 (eight) hours as needed for muscle spasms. 40 tablet 0  . pantoprazole (PROTONIX) 20 MG tablet TAKE 1 TABLET(20 MG) BY MOUTH DAILY 90 tablet 1  . potassium chloride SA (K-DUR,KLOR-CON) 20 MEQ tablet TAKE 1 TABLET BY MOUTH DAILY 90 tablet 1  . predniSONE (DELTASONE) 50 MG tablet Take one a day (Patient not taking: Reported on 08/24/2017) 5 tablet 0  . Psyllium (VEGETABLE LAXATIVE PO) Take 12 tablets by mouth at bedtime.     . temazepam (RESTORIL) 7.5 MG capsule Take 1 capsule (7.5 mg total) by mouth at bedtime as needed for sleep. 30 capsule 0  . triamterene-hydrochlorothiazide (MAXZIDE-25) 37.5-25 MG tablet TAKE 1 TABLET BY MOUTH DAILY 90 tablet  0   No current facility-administered medications for this visit.     Patient Active Problem List   Diagnosis Date Noted  . Ceruminosis, right 06/12/2017  . Acute sinusitis 07/02/2015  . Constipation 03/26/2015  . Degenerative arthritis of knee, bilateral 11/17/2014  . Routine general medical examination at a health care facility 04/25/2014  . Hypothyroidism 08/21/2008  . Hyperlipidemia 08/21/2008  . Essential hypertension 08/21/2008  . MITRAL VALVE PROLAPSE 08/21/2008   Past Medical History:  Diagnosis Date  . Allergy   . Arthritis   . Cataract   . Chronic bronchitis ([place])   . Complication of anesthesia    hard to wake up  . Diverticulosis   . Environmental allergies   . GERD (gastroesophageal reflux disease)   . Hiatal hernia   . History of chicken pox   . Hyperlipidemia   .  Hypothyroidism   . IBS (irritable bowel syndrome)   . Migraines   . Mitral valve prolapse   . Rectal prolapse     Family History  Problem Relation Age of Onset  . Stomach cancer Maternal Grandmother   . Hypertension Maternal Grandmother   . Diabetes Maternal Grandmother   . Stroke Maternal Grandmother   . Cancer Maternal Grandmother        Stomach cancer  . Colon cancer Paternal Grandfather   . Heart attack Paternal Grandfather   . Hyperlipidemia Mother   . Diabetes Mother   . Hypertension Mother   . Alzheimer's disease Mother   . Diabetes Father   . Hyperlipidemia Father   . Heart disease Father   . Heart attack Father   . Stroke Paternal Grandmother     Past Surgical History:  Procedure Laterality Date  . ABDOMINAL HYSTERECTOMY  1999  . APPENDECTOMY  1962  . BREAST CYST ASPIRATION Bilateral   . EYE SURGERY    . JOINT REPLACEMENT     rt knee scope  . REFRACTIVE SURGERY  2000   both eyes  . TOTAL KNEE ARTHROPLASTY Right 03/24/2015   Procedure: TOTAL KNEE ARTHROPLASTY;  Surgeon: [name], MD;  Location: [facility];  Service: Orthopedics;  Laterality: Right;  . TUBAL LIGATION   1977   Social History   Occupational History  . Occupation: PROOF READER    Employer: [employer]  Tobacco Use  . Smoking status: Former Smoker    Types: Cigarettes    Last attempt to quit: 03/23/1976    Years since quitting: 41.5  . Smokeless tobacco: Never Used  Substance and Sexual Activity  . Alcohol use: No  . Drug use: No  . Sexual activity: Yes    Birth control/protection: Abstinence

## 2017-10-01 ENCOUNTER — Other Ambulatory Visit: Payer: Self-pay | Admitting: Internal Medicine

## 2017-10-04 ENCOUNTER — Encounter (INDEPENDENT_AMBULATORY_CARE_PROVIDER_SITE_OTHER): Payer: Self-pay | Admitting: Orthopedic Surgery

## 2017-10-04 ENCOUNTER — Ambulatory Visit (INDEPENDENT_AMBULATORY_CARE_PROVIDER_SITE_OTHER): Payer: PPO | Admitting: Orthopedic Surgery

## 2017-10-04 DIAGNOSIS — M1712 Unilateral primary osteoarthritis, left knee: Secondary | ICD-10-CM

## 2017-10-04 MED ORDER — LIDOCAINE HCL 1 % IJ SOLN
2.0000 mL | INTRAMUSCULAR | Status: AC | PRN
  Administered 2017-10-04: 2 mL

## 2017-10-04 MED ORDER — HYALURONAN 30 MG/2ML IX SOSY
30.0000 mg | PREFILLED_SYRINGE | INTRA_ARTICULAR | Status: AC | PRN
  Administered 2017-10-04: 30 mg via INTRA_ARTICULAR

## 2017-10-04 NOTE — Progress Notes (Signed)
Office Visit Note   Patient: Megan Valentine           Date of Birth: 04-16-1950           MRN: [id number] Visit Date: 10/04/2017              Requested by: Hoyt Koch, MD [place], [place] [address] PCP: Hoyt Koch, MD   Assessment & Plan: Visit Diagnoses:  1. Unilateral primary osteoarthritis, left knee     Plan:  #1: Orthovisc injection #2 to the left knee was given without difficulty #2: Follow back up 1 week for the third Orthovisc injection to the left knee   Follow-Up Instructions: Return in about 1 week (around 10/11/2017).   Orders:  Orders Placed This Encounter  Procedures  . Large Joint Inj   No orders of the defined types were placed in this encounter.     Procedures: Large Joint Inj: L knee on 10/04/2017 9:37 AM Indications: pain and joint swelling Details: 25 G 1.5 in needle, medial approach  Arthrogram: No  Medications: 2 mL lidocaine 1 %; 30 mg Hyaluronan 30 MG/2ML Outcome: tolerated well, no immediate complications Procedure, treatment alternatives, risks and benefits explained, specific risks discussed. Consent was given by the patient. Immediately prior to procedure a time out was called to verify the correct patient, procedure, equipment, support staff and site/side marked as required. Patient was prepped and draped in the usual sterile fashion.       Clinical Data: No additional findings.   Subjective: No chief complaint on file.   HPI  Dhruti returns today for follow-up of her knee arthritis.  She has had one Orthovisc injection which she states has not had any benefit yet.  No reactivity though.  Review of Systems  Constitutional: Negative for fatigue and fever.  HENT: Negative for ear pain.   Eyes: Negative for pain.  Respiratory: Negative for cough and shortness of breath.   Cardiovascular: Negative for leg swelling.  Gastrointestinal: Positive for constipation. Negative for diarrhea.    Genitourinary: Negative for difficulty urinating.  Musculoskeletal: Negative for back pain and neck pain.  Skin: Negative for rash.  Allergic/Immunologic: Negative for food allergies.  Neurological: Positive for weakness. Negative for numbness.  Hematological: Bruises/bleeds easily.  Psychiatric/Behavioral: Positive for sleep disturbance.     Objective: Vital Signs: There were no vitals taken for this visit.  Physical Exam  Constitutional: She is oriented to person, place, and time. She appears well-developed and well-nourished.  HENT:  Mouth/Throat: Oropharynx is clear and moist.  Eyes: Pupils are equal, round, and reactive to light. EOM are normal.  Pulmonary/Chest: Effort normal.  Neurological: She is alert and oriented to person, place, and time.  Skin: Skin is warm and dry.  Psychiatric: She has a normal mood and affect. Her behavior is normal.    Ortho Exam  No warmth or erythema is noted about the knee.  Ambulating without difficulty.  No reactivity.  Specialty Comments:  No specialty comments available.  Imaging: No results found.   PMFS History: Current Outpatient Medications  Medication Sig Dispense Refill  . aspirin 81 MG chewable tablet Chew 81 mg daily by mouth.    . cholecalciferol (VITAMIN D) 1000 UNITS tablet Take 2,000 Units by mouth daily.     . Cyanocobalamin (VITAMIN B12 PO) Take 1,000 mg by mouth daily. 2500 mg daily    . estradiol (ESTRACE) 0.5 MG tablet TAKE 1 TABLET(0.5 MG) BY MOUTH DAILY 90  tablet 0  . levothyroxine (SYNTHROID, LEVOTHROID) 100 MCG tablet TAKE 1 TABLET BY MOUTH DAILY 90 tablet 2  . loratadine (CLARITIN) 10 MG tablet Take 10 mg by mouth daily. Reported on 03/11/2015    . meclizine (ANTIVERT) 25 MG tablet Take 25 mg by mouth daily as needed for dizziness.     . methocarbamol (ROBAXIN) 500 MG tablet Take 1 tablet (500 mg total) by mouth every 8 (eight) hours as needed for muscle spasms. 40 tablet 0  . pantoprazole (PROTONIX) 20 MG  tablet TAKE 1 TABLET(20 MG) BY MOUTH DAILY 90 tablet 1  . potassium chloride SA (K-DUR,KLOR-CON) 20 MEQ tablet TAKE 1 TABLET BY MOUTH DAILY 90 tablet 1  . predniSONE (DELTASONE) 50 MG tablet Take one a day (Patient not taking: Reported on 08/24/2017) 5 tablet 0  . Psyllium (VEGETABLE LAXATIVE PO) Take 12 tablets by mouth at bedtime.     . temazepam (RESTORIL) 7.5 MG capsule Take 1 capsule (7.5 mg total) by mouth at bedtime as needed for sleep. 30 capsule 0  . triamterene-hydrochlorothiazide (MAXZIDE-25) 37.5-25 MG tablet Take 1 tablet by mouth daily. Need annual appointment for further refills 90 tablet 0   No current facility-administered medications for this visit.     Patient Active Problem List   Diagnosis Date Noted  . Ceruminosis, right 06/12/2017  . Acute sinusitis 07/02/2015  . Constipation 03/26/2015  . Degenerative arthritis of knee, bilateral 11/17/2014  . Routine general medical examination at a health care facility 04/25/2014  . Hypothyroidism 08/21/2008  . Hyperlipidemia 08/21/2008  . Essential hypertension 08/21/2008  . MITRAL VALVE PROLAPSE 08/21/2008   Past Medical History:  Diagnosis Date  . Allergy   . Arthritis   . Cataract   . Chronic bronchitis ([place])   . Complication of anesthesia    hard to wake up  . Diverticulosis   . Environmental allergies   . GERD (gastroesophageal reflux disease)   . Hiatal hernia   . History of chicken pox   . Hyperlipidemia   . Hypothyroidism   . IBS (irritable bowel syndrome)   . Migraines   . Mitral valve prolapse   . Rectal prolapse     Family History  Problem Relation Age of Onset  . Stomach cancer Maternal Grandmother   . Hypertension Maternal Grandmother   . Diabetes Maternal Grandmother   . Stroke Maternal Grandmother   . Cancer Maternal Grandmother        Stomach cancer  . Colon cancer Paternal Grandfather   . Heart attack Paternal Grandfather   . Hyperlipidemia Mother   . Diabetes Mother   . Hypertension  Mother   . Alzheimer's disease Mother   . Diabetes Father   . Hyperlipidemia Father   . Heart disease Father   . Heart attack Father   . Stroke Paternal Grandmother     Past Surgical History:  Procedure Laterality Date  . ABDOMINAL HYSTERECTOMY  1999  . APPENDECTOMY  1962  . BREAST CYST ASPIRATION Bilateral   . EYE SURGERY    . JOINT REPLACEMENT     rt knee scope  . REFRACTIVE SURGERY  2000   both eyes  . TOTAL KNEE ARTHROPLASTY Right 03/24/2015   Procedure: TOTAL KNEE ARTHROPLASTY;  Surgeon: [name], MD;  Location: [facility];  Service: Orthopedics;  Laterality: Right;  . TUBAL LIGATION  1977   Social History   Occupational History  . Occupation: PROOF READER    Employer: [employer]  Tobacco Use  .  Smoking status: Former Smoker    Types: Cigarettes    Last attempt to quit: 03/23/1976    Years since quitting: 41.5  . Smokeless tobacco: Never Used  Substance and Sexual Activity  . Alcohol use: No  . Drug use: No  . Sexual activity: Yes    Birth control/protection: Abstinence

## 2017-10-07 ENCOUNTER — Other Ambulatory Visit: Payer: Self-pay | Admitting: Internal Medicine

## 2017-10-11 ENCOUNTER — Ambulatory Visit (INDEPENDENT_AMBULATORY_CARE_PROVIDER_SITE_OTHER): Payer: PPO | Admitting: Orthopedic Surgery

## 2017-10-11 ENCOUNTER — Encounter (INDEPENDENT_AMBULATORY_CARE_PROVIDER_SITE_OTHER): Payer: Self-pay | Admitting: Orthopedic Surgery

## 2017-10-11 VITALS — BP 107/63 | HR 69 | Ht 64.0 in | Wt 150.0 lb

## 2017-10-11 DIAGNOSIS — M1712 Unilateral primary osteoarthritis, left knee: Secondary | ICD-10-CM | POA: Diagnosis not present

## 2017-10-11 MED ORDER — BUPIVACAINE HCL 0.5 % IJ SOLN
2.0000 mL | INTRAMUSCULAR | Status: AC | PRN
Start: 2017-10-11 — End: 2017-10-11
  Administered 2017-10-11: 2 mL via INTRA_ARTICULAR

## 2017-10-11 MED ORDER — LIDOCAINE HCL 1 % IJ SOLN
2.0000 mL | INTRAMUSCULAR | Status: AC | PRN
  Administered 2017-10-11: 2 mL

## 2017-10-11 MED ORDER — HYALURONAN 30 MG/2ML IX SOSY
30.0000 mg | PREFILLED_SYRINGE | INTRA_ARTICULAR | Status: AC | PRN
  Administered 2017-10-11: 30 mg via INTRA_ARTICULAR

## 2017-10-11 NOTE — Progress Notes (Signed)
Office Visit Note   Patient: Megan Valentine           Date of Birth: Sep 18, 1950           MRN: [id number] Visit Date: 10/11/2017              Requested by: Hoyt Koch, MD [place], [place] [id number] PCP: Hoyt Koch, MD   Assessment & Plan: Visit Diagnoses:  1. Unilateral primary osteoarthritis, left knee     Plan:  #1: Third Orthovisc injection was given without difficulty #2: Follow-up as needed  Follow-Up Instructions: No follow-ups on file.   Orders:  No orders of the defined types were placed in this encounter.  No orders of the defined types were placed in this encounter.     Procedures: Large Joint Inj: L knee on 10/11/2017 11:33 AM Indications: pain and diagnostic evaluation Details: 25 G 1.5 in needle, medial approach  Arthrogram: No  Medications: 2 mL lidocaine 1 %; 2 mL bupivacaine 0.5 %; 30 mg Hyaluronan 30 MG/2ML Outcome: tolerated well, no immediate complications Procedure, treatment alternatives, risks and benefits explained, specific risks discussed. Consent was given by the patient. Immediately prior to procedure a time out was called to verify the correct patient, procedure, equipment, support staff and site/side marked as required. Patient was prepped and draped in the usual sterile fashion.       Clinical Data: No additional findings.   Subjective: Chief Complaint  Patient presents with  . Follow-up    # 3 L KNEE ORTHOVISC INJECTION    HPI  Pura is seen today for her third Orthovisc injection.  Not much benefit as of this time.  States that she had pain on monday  Review of Systems  Constitutional: Negative for fatigue and fever.  HENT: Negative for ear pain.   Eyes: Negative for pain.  Respiratory: Negative for cough and shortness of breath.   Cardiovascular: Negative for leg swelling.  Gastrointestinal: Negative for constipation and diarrhea.  Genitourinary: Negative for difficulty urinating.    Musculoskeletal: Negative for back pain and neck pain.  Skin: Negative for rash.  Allergic/Immunologic: Negative for food allergies.  Neurological: Positive for weakness. Negative for numbness.  Hematological: Does not bruise/bleed easily.  Psychiatric/Behavioral: Negative for sleep disturbance.     Objective: Vital Signs: BP 107/63 (BP Location: Right Arm, Patient Position: Sitting, Cuff Size: Normal)   Pulse 69   Ht 5\' 4"  (1.626 m)   Wt 150 lb (68 kg)   BMI 25.75 kg/m   Physical Exam  Ortho Exam  Knee is quite benign.  No reactivity  Specialty Comments:  No specialty comments available.  Imaging: No results found.   PMFS History: Patient Active Problem List   Diagnosis Date Noted  . Ceruminosis, right 06/12/2017  . Acute sinusitis 07/02/2015  . Constipation 03/26/2015  . Degenerative arthritis of knee, bilateral 11/17/2014  . Routine general medical examination at a health care facility 04/25/2014  . Hypothyroidism 08/21/2008  . Hyperlipidemia 08/21/2008  . Essential hypertension 08/21/2008  . MITRAL VALVE PROLAPSE 08/21/2008   Past Medical History:  Diagnosis Date  . Allergy   . Arthritis   . Cataract   . Chronic bronchitis ([place])   . Complication of anesthesia    hard to wake up  . Diverticulosis   . Environmental allergies   . GERD (gastroesophageal reflux disease)   . Hiatal hernia   . History of chicken pox   . Hyperlipidemia   .  Hypothyroidism   . IBS (irritable bowel syndrome)   . Migraines   . Mitral valve prolapse   . Rectal prolapse     Family History  Problem Relation Age of Onset  . Stomach cancer Maternal Grandmother   . Hypertension Maternal Grandmother   . Diabetes Maternal Grandmother   . Stroke Maternal Grandmother   . Cancer Maternal Grandmother        Stomach cancer  . Colon cancer Paternal Grandfather   . Heart attack Paternal Grandfather   . Hyperlipidemia Mother   . Diabetes Mother   . Hypertension Mother   .  Alzheimer's disease Mother   . Diabetes Father   . Hyperlipidemia Father   . Heart disease Father   . Heart attack Father   . Stroke Paternal Grandmother     Past Surgical History:  Procedure Laterality Date  . ABDOMINAL HYSTERECTOMY  1999  . APPENDECTOMY  1962  . BREAST CYST ASPIRATION Bilateral   . EYE SURGERY    . JOINT REPLACEMENT     rt knee scope  . REFRACTIVE SURGERY  2000   both eyes  . TOTAL KNEE ARTHROPLASTY Right 03/24/2015   Procedure: TOTAL KNEE ARTHROPLASTY;  Surgeon: [name], MD;  Location: [facility];  Service: Orthopedics;  Laterality: Right;  . TUBAL LIGATION  1977   Social History   Occupational History  . Occupation: PROOF READER    Employer: [employer]  Tobacco Use  . Smoking status: Former Smoker    Types: Cigarettes    Last attempt to quit: 03/23/1976    Years since quitting: 41.5  . Smokeless tobacco: Never Used  Substance and Sexual Activity  . Alcohol use: No  . Drug use: No  . Sexual activity: Yes    Birth control/protection: Abstinence

## 2017-10-19 ENCOUNTER — Ambulatory Visit (INDEPENDENT_AMBULATORY_CARE_PROVIDER_SITE_OTHER): Payer: PPO | Admitting: Family Medicine

## 2017-10-19 ENCOUNTER — Encounter: Payer: Self-pay | Admitting: Family Medicine

## 2017-10-19 VITALS — BP 126/80 | HR 74 | Ht 64.0 in | Wt 154.5 lb

## 2017-10-19 DIAGNOSIS — J069 Acute upper respiratory infection, unspecified: Secondary | ICD-10-CM

## 2017-10-19 DIAGNOSIS — I10 Essential (primary) hypertension: Secondary | ICD-10-CM

## 2017-10-19 DIAGNOSIS — E559 Vitamin D deficiency, unspecified: Secondary | ICD-10-CM | POA: Diagnosis not present

## 2017-10-19 DIAGNOSIS — E782 Mixed hyperlipidemia: Secondary | ICD-10-CM | POA: Diagnosis not present

## 2017-10-19 DIAGNOSIS — E039 Hypothyroidism, unspecified: Secondary | ICD-10-CM

## 2017-10-19 DIAGNOSIS — Z23 Encounter for immunization: Secondary | ICD-10-CM

## 2017-10-19 DIAGNOSIS — N951 Menopausal and female climacteric states: Secondary | ICD-10-CM | POA: Diagnosis not present

## 2017-10-19 DIAGNOSIS — M170 Bilateral primary osteoarthritis of knee: Secondary | ICD-10-CM

## 2017-10-19 DIAGNOSIS — L255 Unspecified contact dermatitis due to plants, except food: Secondary | ICD-10-CM

## 2017-10-19 DIAGNOSIS — Z13820 Encounter for screening for osteoporosis: Secondary | ICD-10-CM

## 2017-10-19 LAB — URINALYSIS, ROUTINE W REFLEX MICROSCOPIC
Bilirubin Urine: NEGATIVE
HGB URINE DIPSTICK: NEGATIVE
Ketones, ur: NEGATIVE
Leukocytes, UA: NEGATIVE
NITRITE: NEGATIVE
RBC / HPF: NONE SEEN (ref 0–?)
Specific Gravity, Urine: <=1.005 — AB (ref 1.000–1.030)
TOTAL PROTEIN, URINE-UPE24: NEGATIVE
UROBILINOGEN UA: 0.2 (ref 0.0–1.0)
Urine Glucose: NEGATIVE
WBC UA: NONE SEEN (ref 0–?)
pH: 7 (ref 5.0–8.0)

## 2017-10-19 LAB — CBC
HCT: 43.8 % (ref 36.0–46.0)
HEMOGLOBIN: 14.8 g/dL (ref 12.0–15.0)
MCHC: 33.8 g/dL (ref 30.0–36.0)
MCV: 94.3 fl (ref 78.0–100.0)
PLATELETS: 337 10*3/uL (ref 150.0–400.0)
RBC: 4.64 Mil/uL (ref 3.87–5.11)
RDW: 13.5 % (ref 11.5–15.5)
WBC: 8 10*3/uL (ref 4.0–10.5)

## 2017-10-19 LAB — COMPREHENSIVE METABOLIC PANEL
ALT: 20 U/L (ref 0–35)
AST: 23 U/L (ref 0–37)
Albumin: 4.4 g/dL (ref 3.5–5.2)
Alkaline Phosphatase: 70 U/L (ref 39–117)
BILIRUBIN TOTAL: 0.7 mg/dL (ref 0.2–1.2)
BUN: 21 mg/dL (ref 6–23)
CALCIUM: 9.6 mg/dL (ref 8.4–10.5)
CO2: 29 meq/L (ref 19–32)
Chloride: 102 mEq/L (ref 96–112)
Creatinine, Ser: 1.02 mg/dL (ref 0.40–1.20)
GFR: 57.35 mL/min — AB (ref >60.00)
GLUCOSE: 88 mg/dL (ref 70–99)
Potassium: 3.3 mEq/L — ABNORMAL LOW (ref 3.5–5.1)
Sodium: 140 mEq/L (ref 135–145)
Total Protein: 7.4 g/dL (ref 6.0–8.3)

## 2017-10-19 LAB — LIPID PANEL
CHOL/HDL RATIO: 3
Cholesterol: 213 mg/dL — ABNORMAL HIGH (ref 0–200)
HDL: 75.5 mg/dL (ref >39.00)
LDL Cholesterol: 117 mg/dL — ABNORMAL HIGH (ref 0–99)
NONHDL: 137
TRIGLYCERIDES: 98 mg/dL (ref 0.0–149.0)
VLDL: 19.6 mg/dL (ref 0.0–40.0)

## 2017-10-19 LAB — MICROALBUMIN / CREATININE URINE RATIO
Creatinine,U: 18.6 mg/dL
MICROALB/CREAT RATIO: 3.8 mg/g (ref 0.0–30.0)

## 2017-10-19 LAB — TSH: TSH: 3.06 u[IU]/mL (ref 0.35–4.50)

## 2017-10-19 LAB — VITAMIN D 25 HYDROXY (VIT D DEFICIENCY, FRACTURES): VITD: 40.05 ng/mL (ref 30.00–100.00)

## 2017-10-19 MED ORDER — METHYLPREDNISOLONE ACETATE 80 MG/ML IJ SUSP
80.0000 mg | Freq: Once | INTRAMUSCULAR | Status: AC
  Administered 2017-10-19: 80 mg via INTRAMUSCULAR

## 2017-10-19 NOTE — Patient Instructions (Addendum)
Bone Densitometry Bone densitometry is an imaging test that uses a special X-ray to measure the amount of calcium and other minerals in your bones (bone density). This test is also known as a bone mineral density test or dual-energy X-ray absorptiometry (DXA). The test can measure bone density at your hip and your spine. It is similar to having a regular X-ray. You may have this test to:  Diagnose a condition that causes weak or thin bones (osteoporosis).  Predict your risk of a broken bone (fracture).  Determine how well osteoporosis treatment is working.  Tell a health care provider about:  Any allergies you have.  All medicines you are taking, including vitamins, herbs, eye drops, creams, and over-the-counter medicines.  Any problems you or family members have had with anesthetic medicines.  Any blood disorders you have.  Any surgeries you have had.  Any medical conditions you have.  Possibility of pregnancy.  Any other medical test you had within the previous 14 days that used contrast material. What are the risks? Generally, this is a safe procedure. However, problems can occur and may include the following:  This test exposes you to a very small amount of radiation.  The risks of radiation exposure may be greater to unborn children.  What happens before the procedure?  Do not take any calcium supplements for 24 hours before having the test. You can otherwise eat and drink what you usually do.  Take off all metal jewelry, eyeglasses, dental appliances, and any other metal objects. What happens during the procedure?  You may lie on an exam table. There will be an X-ray generator below you and an imaging device above you.  Other devices, such as boxes or braces, may be used to position your body properly for the scan.  You will need to lie still while the machine slowly scans your body.  The images will show up on a computer monitor. What happens after the  procedure? You may need more testing at a later time. This information is not intended to replace advice given to you by your health care provider. Make sure you discuss any questions you have with your health care provider. Document Released: 02/02/2004 Document Revised: 06/18/2015 Document Reviewed: 06/20/2013 Elsevier Interactive Patient Education  2018 [employer].  Hypothyroidism Hypothyroidism is a disorder of the thyroid. The thyroid is a large gland that is located in the lower front of the neck. The thyroid releases hormones that control how the body works. With hypothyroidism, the thyroid does not make enough of these hormones. What are the causes? Causes of hypothyroidism may include:  Viral infections.  Pregnancy.  Your own defense system (immune system) attacking your thyroid.  Certain medicines.  Birth defects.  Past radiation treatments to your head or neck.  Past treatment with radioactive iodine.  Past surgical removal of part or all of your thyroid.  Problems with the gland that is located in the center of your brain (pituitary).  What are the signs or symptoms? Signs and symptoms of hypothyroidism may include:  Feeling as though you have no energy (lethargy).  Inability to tolerate cold.  Weight gain that is not explained by a change in diet or exercise habits.  Dry skin.  Coarse hair.  Menstrual irregularity.  Slowing of thought processes.  Constipation.  Sadness or depression.  How is this diagnosed? Your health care provider may diagnose hypothyroidism with blood tests and ultrasound tests. How is this treated? Hypothyroidism is treated with medicine that  replaces the hormones that your body does not make. After you begin treatment, it may take several weeks for symptoms to go away. Follow these instructions at home:  Take medicines only as directed by your health care provider.  If you start taking any new medicines, tell your health  care provider.  Keep all follow-up visits as directed by your health care provider. This is important. As your condition improves, your dosage needs may change. You will need to have blood tests regularly so that your health care provider can watch your condition. Contact a health care provider if:  Your symptoms do not get better with treatment.  You are taking thyroid replacement medicine and: ? You sweat excessively. ? You have tremors. ? You feel anxious. ? You lose weight rapidly. ? You cannot tolerate heat. ? You have emotional swings. ? You have diarrhea. ? You feel weak. Get help right away if:  You develop chest pain.  You develop an irregular heartbeat.  You develop a rapid heartbeat. This information is not intended to replace advice given to you by your health care provider. Make sure you discuss any questions you have with your health care provider. Document Released: 01/10/2005 Document Revised: 06/18/2015 Document Reviewed: 05/28/2013 Elsevier Interactive Patient Education  2018 [place].  Menopause and Hormone Replacement Therapy What is hormone replacement therapy? Hormone replacement therapy (HRT) is the use of artificial (synthetic) hormones to replace hormones that your body stops producing during menopause. Menopause is the normal time of life when menstrual periods stop completely and the ovaries stop producing the female hormones estrogen and progesterone. This lack of hormones can affect your health and cause undesirable symptoms. HRT can relieve some of those symptoms. What are my options for HRT? HRT may consist of the synthetic hormones estrogen and progestin, or it may consist of only estrogen (estrogen-only therapy). You and your health care provider will decide which form of HRT is best for you. If you choose to be on HRT and you have a uterus, estrogen and progestin are usually prescribed. Estrogen-only therapy is used for women who do not have a  uterus. Possible options for taking HRT include:  Pills.  Patches.  Gels.  Sprays.  Vaginal cream.  Vaginal rings.  Vaginal inserts.  The amount of hormone(s) that you take and how long you take the hormone(s) varies depending on your individual health. It is important to:  Begin HRT with the lowest possible dosage.  Stop HRT as soon as your health care provider tells you to stop.  Work with your health care provider so that you feel informed and comfortable with your decisions.  What are the benefits of HRT? HRT can reduce the frequency and severity of menopausal symptoms. Benefits of HRT vary depending on the menopausal symptoms that you have, the severity of your symptoms, and your overall health. HRT may help to improve the following menopausal symptoms:  Hot flashes and night sweats. These are sudden feelings of heat that spread over the face and body. The skin may turn red, like a blush. Night sweats are hot flashes that happen while you are sleeping or trying to sleep.  Bone loss (osteoporosis). The body loses calcium more quickly after menopause, causing the bones to become weaker. This can increase the risk for bone breaks (fractures).  Vaginal dryness. The lining of the vagina can become thin and dry, which can cause pain during sexual intercourse or cause infection, burning, or itching.  Urinary tract infections.  Urinary incontinence. This is a decreased ability to control when you urinate.  Irritability.  Short-term memory problems.  What are the risks of HRT? Risks of HRT vary depending on your individual health and medical history. Risks of HRT also depend on whether you receive both estrogen and progestin or you receive estrogen only.HRT may increase the risk of:  Spotting. This is when a small amount of bloodleaks from the vagina unexpectedly.  Endometrial cancer. This cancer is in the lining of the uterus (endometrium).  Breast  cancer.  Increased density of breast tissue. This can make it harder to find breast cancer on a breast X-ray (mammogram).  Stroke.  Heart attack.  Blood clots.  Gallbladder disease.  Risks of HRT can increase if you have any of the following conditions:  Endometrial cancer.  Liver disease.  Heart disease.  Breast cancer.  History of blood clots.  History of stroke.  How should I care for myself while I am on HRT?  Take over-the-counter and prescription medicines only as told by your health care provider.  Get mammograms, pelvic exams, and medical checkups as often as told by your health care provider.  Have Pap tests done as often as told by your health care provider. A Pap test is sometimes called a Pap smear. It is a screening test that is used to check for signs of cancer of the cervix and vagina. A Pap test can also identify the presence of infection or precancerous changes. Pap tests may be done: ? Every 3 years, starting at age 41. ? Every 5 years, starting after age 37, in combination with testing for human papillomavirus (HPV). ? More often or less often depending on other medical conditions you have, your age, and other risk factors.  It is your responsibility to get your Pap test results. Ask your health care provider or the department performing the test when your results will be ready.  Keep all follow-up visits as told by your health care provider. This is important. When should I seek medical care? Talk with your health care provider if:  You have any of these: ? Pain or swelling in your legs. ? Shortness of breath. ? Chest pain. ? Lumps or changes in your breasts or armpits. ? Slurred speech. ? Pain, burning, or bleeding when you urine.  You develop any of these: ? Unusual vaginal bleeding. ? Dizziness or headaches. ? Weakness or numbness in any part of your arms or legs. ? Pain in your abdomen.  This information is not intended to replace  advice given to you by your health care provider. Make sure you discuss any questions you have with your health care provider. Document Released: 10/09/2002 Document Revised: 12/08/2015 Document Reviewed: 07/14/2014 Elsevier Interactive Patient Education  2017 [place] Dermatitis Poison ivy dermatitis is redness and soreness (inflammation) of the skin. It is caused by a chemical that is found on the leaves of the poison ivy plant. You may also have itching, a rash, and blisters. Symptoms often clear up in 1-2 weeks. You may get this condition by touching a poison ivy plant. You can also get it by touching something that has the chemical on it. This may include animals or objects that have come in contact with the plant. Follow these instructions at home: General instructions  Take or apply over-the-counter and prescription medicines only as told by your doctor.  If you touch poison ivy, wash your skin with soap and cold water  right away.  Use hydrocortisone creams or calamine lotion as needed to help with itching.  Take oatmeal baths as needed. Use colloidal oatmeal. You can get this at a pharmacy or grocery store. Follow the instructions on the package.  Do not scratch or rub your skin.  While you have the rash, wash your clothes right after you wear them. Prevention  Know what poison ivy looks like so you can avoid it. This plant has three leaves with flowering branches on a single stem. The leaves are glossy. They have uneven edges that come to a point at the front.  If you have touched poison ivy, wash with soap and water right away. Be sure to wash under your fingernails.  When hiking or camping, wear long pants, a long-sleeved shirt, tall socks, and hiking boots. You can also use a lotion on your skin that helps to prevent contact with the chemical on the plant.  If you think that your clothes or outdoor gear came in contact with poison ivy, rinse them off with a  garden hose before you bring them inside your house. Contact a doctor if:  You have open sores in the rash area.  You have more redness, swelling, or pain in the affected area.  You have redness that spreads beyond the rash area.  You have fluid, blood, or pus coming from the affected area.  You have a fever.  You have a rash over a large area of your body.  You have a rash on your eyes, mouth, or genitals.  Your rash does not get better after a few days. Get help right away if:  Your face swells or your eyes swell shut.  You have trouble breathing.  You have trouble swallowing. This information is not intended to replace advice given to you by your health care provider. Make sure you discuss any questions you have with your health care provider. Document Released: 02/12/2010 Document Revised: 06/18/2015 Document Reviewed: 06/18/2014 Elsevier Interactive Patient Education  2018 [place] Eating Plan DASH stands for "Dietary Approaches to Stop Hypertension." The DASH eating plan is a healthy eating plan that has been shown to reduce high blood pressure (hypertension). It may also reduce your risk for type 2 diabetes, heart disease, and stroke. The DASH eating plan may also help with weight loss. What are tips for following this plan? General guidelines  Avoid eating more than 2,300 mg (milligrams) of salt (sodium) a day. If you have hypertension, you may need to reduce your sodium intake to 1,500 mg a day.  Limit alcohol intake to no more than 1 drink a day for nonpregnant women and 2 drinks a day for men. One drink equals 12 oz of beer, 5 oz of wine, or 1 oz of hard liquor.  Work with your health care provider to maintain a healthy body weight or to lose weight. Ask what an ideal weight is for you.  Get at least 30 minutes of exercise that causes your heart to beat faster (aerobic exercise) most days of the week. Activities may include walking, swimming, or  biking.  Work with your health care provider or diet and nutrition specialist (dietitian) to adjust your eating plan to your individual calorie needs. Reading food labels  Check food labels for the amount of sodium per serving. Choose foods with less than 5 percent of the Daily Value of sodium. Generally, foods with less than 300 mg of sodium per serving fit into this eating  plan.  To find whole grains, look for the word "whole" as the first word in the ingredient list. Shopping  Buy products labeled as "low-sodium" or "no salt added."  Buy fresh foods. Avoid canned foods and premade or frozen meals. Cooking  Avoid adding salt when cooking. Use salt-free seasonings or herbs instead of table salt or sea salt. Check with your health care provider or pharmacist before using salt substitutes.  Do not fry foods. Cook foods using healthy methods such as baking, boiling, grilling, and broiling instead.  Cook with heart-healthy oils, such as olive, canola, soybean, or sunflower oil. Meal planning   Eat a balanced diet that includes: ? 5 or more servings of fruits and vegetables each day. At each meal, try to fill half of your plate with fruits and vegetables. ? Up to 6-8 servings of whole grains each day. ? Less than 6 oz of lean meat, poultry, or fish each day. A 3-oz serving of meat is about the same size as a deck of cards. One egg equals 1 oz. ? 2 servings of low-fat dairy each day. ? A serving of nuts, seeds, or beans 5 times each week. ? Heart-healthy fats. Healthy fats called Omega-3 fatty acids are found in foods such as flaxseeds and coldwater fish, like sardines, salmon, and mackerel.  Limit how much you eat of the following: ? Canned or prepackaged foods. ? Food that is high in trans fat, such as fried foods. ? Food that is high in saturated fat, such as fatty meat. ? Sweets, desserts, sugary drinks, and other foods with added sugar. ? Full-fat dairy products.  Do not salt  foods before eating.  Try to eat at least 2 vegetarian meals each week.  Eat more home-cooked food and less restaurant, buffet, and fast food.  When eating at a restaurant, ask that your food be prepared with less salt or no salt, if possible. What foods are recommended? The items listed may not be a complete list. Talk with your dietitian about what dietary choices are best for you. Grains Whole-grain or whole-wheat bread. Whole-grain or whole-wheat pasta. Brown rice. [place]. Bulgur. Whole-grain and low-sodium cereals. Pita bread. Low-fat, low-sodium crackers. Whole-wheat flour tortillas. Vegetables Fresh or frozen vegetables (raw, steamed, roasted, or grilled). Low-sodium or reduced-sodium tomato and vegetable juice. Low-sodium or reduced-sodium tomato sauce and tomato paste. Low-sodium or reduced-sodium canned vegetables. Fruits All fresh, dried, or frozen fruit. Canned fruit in natural juice (without added sugar). Meat and other protein foods Skinless chicken or [place]. Ground chicken or [place]. Pork with fat trimmed off. Fish and seafood. Egg whites. Dried beans, peas, or lentils. Unsalted nuts, nut butters, and seeds. Unsalted canned beans. Lean cuts of beef with fat trimmed off. Low-sodium, lean deli meat. Dairy Low-fat (1%) or fat-free (skim) milk. Fat-free, low-fat, or reduced-fat cheeses. Nonfat, low-sodium ricotta or cottage cheese. Low-fat or nonfat yogurt. Low-fat, low-sodium cheese. Fats and oils Soft margarine without trans fats. Vegetable oil. Low-fat, reduced-fat, or light mayonnaise and salad dressings (reduced-sodium). Canola, safflower, olive, soybean, and sunflower oils. Avocado. Seasoning and other foods Herbs. Spices. Seasoning mixes without salt. Unsalted popcorn and pretzels. Fat-free sweets. What foods are not recommended? The items listed may not be a complete list. Talk with your dietitian about what dietary choices are best for you. Grains Baked goods  made with fat, such as croissants, muffins, or some breads. Dry pasta or rice meal packs. Vegetables Creamed or fried vegetables. Vegetables in a cheese sauce. Regular  canned vegetables (not low-sodium or reduced-sodium). Regular canned tomato sauce and paste (not low-sodium or reduced-sodium). Regular tomato and vegetable juice (not low-sodium or reduced-sodium). Angie Fava. Olives. Fruits Canned fruit in a light or heavy syrup. Fried fruit. Fruit in cream or butter sauce. Meat and other protein foods Fatty cuts of meat. Ribs. Fried meat. Berniece Salines. Sausage. Bologna and other processed lunch meats. Salami. Fatback. Hotdogs. Bratwurst. Salted nuts and seeds. Canned beans with added salt. Canned or smoked fish. Whole eggs or egg yolks. Chicken or [place] with skin. Dairy Whole or 2% milk, cream, and half-and-half. Whole or full-fat cream cheese. Whole-fat or sweetened yogurt. Full-fat cheese. Nondairy creamers. Whipped toppings. Processed cheese and cheese spreads. Fats and oils Butter. Stick margarine. Lard. Shortening. Ghee. Bacon fat. Tropical oils, such as coconut, palm kernel, or palm oil. Seasoning and other foods Salted popcorn and pretzels. Onion salt, garlic salt, seasoned salt, table salt, and sea salt. Worcestershire sauce. Tartar sauce. Barbecue sauce. Teriyaki sauce. Soy sauce, including reduced-sodium. Steak sauce. Canned and packaged gravies. Fish sauce. Oyster sauce. Cocktail sauce. Horseradish that you find on the shelf. Ketchup. Mustard. Meat flavorings and tenderizers. Bouillon cubes. Hot sauce and Tabasco sauce. Premade or packaged marinades. Premade or packaged taco seasonings. Relishes. Regular salad dressings. Where to find more information:  National Heart, Lung, and [place]: https://wilson-eaton.com/  American Heart Association: www.heart.org Summary  The DASH eating plan is a healthy eating plan that has been shown to reduce high blood pressure (hypertension). It may also reduce  your risk for type 2 diabetes, heart disease, and stroke.  With the DASH eating plan, you should limit salt (sodium) intake to 2,300 mg a day. If you have hypertension, you may need to reduce your sodium intake to 1,500 mg a day.  When on the DASH eating plan, aim to eat more fresh fruits and vegetables, whole grains, lean proteins, low-fat dairy, and heart-healthy fats.  Work with your health care provider or diet and nutrition specialist (dietitian) to adjust your eating plan to your individual calorie needs. This information is not intended to replace advice given to you by your health care provider. Make sure you discuss any questions you have with your health care provider. Document Released: 12/30/2010 Document Revised: 01/04/2016 Document Reviewed: 01/04/2016 Elsevier Interactive Patient Education  [employer].

## 2017-10-19 NOTE — Progress Notes (Signed)
Subjective:  Patient ID: Megan Valentine, female    DOB: December 24, 1950  Age: 67 y.o. MRN: [id number]  CC: Annual Exam   HPI Megan Valentine presents for follow-up of her hypertension, hypothyroidism and elevated cholesterol.  Blood pressure is been well controlled with the Dyazide.  She had been prescribed potassium but is had difficulty taking the pill in discontinued it herself sometime ago.  Hypothyroidism has been controlled and she tells me that she does take her Synthroid in the morning time an hour before eating.  Cholesterol has been controlled with diet alone.  She has been taking Estrace tabs for years.  She is status post TAH.  She has had severe hot flashes and vasovagal symptoms when she has tried to discontinue it.  She has yearly mammograms.  She does not smoke.  She is aware of her elevated risk of heart attacks on this medicine.  She is no longer taking the Restoril for sleep because it does not work.  She has tried numerous other medicines that have not been helpful.  She denies depressive symptoms.  She is taking daily supplementation.  Last DEXA scan recorded was some time ago.  She has had a 5-day history of a spreading rash on her arms chest and inner thighs status post pulling weeds in the yard.  She has had a 3-day history of nasal congestion postnasal drip and drainage with a scratchy throat.  She denies wheezing or reactive airway disease.  She has gone back to work part-time at her old job.  She is receiving Synvisc injections into her left knee.  She is status post right total knee replacement.  Outpatient Medications Prior to Visit  Medication Sig Dispense Refill  . aspirin 81 MG chewable tablet Chew 81 mg daily by mouth.    . cetirizine (ZYRTEC) 10 MG tablet Take 10 mg by mouth daily.    . cholecalciferol (VITAMIN D) 1000 UNITS tablet Take 2,000 Units by mouth daily.     . Cyanocobalamin (VITAMIN B12 PO) Take 1,000 mg by mouth daily. 2500 mg daily    . estradiol (ESTRACE)  0.5 MG tablet Take 1 tablet (0.5 mg total) by mouth daily. Need annual exam with labs for further refills 90 tablet 0  . levothyroxine (SYNTHROID, LEVOTHROID) 100 MCG tablet TAKE 1 TABLET BY MOUTH DAILY   Need annual exam with labs for further refills 90 tablet 0  . meclizine (ANTIVERT) 25 MG tablet Take 25 mg by mouth daily as needed for dizziness.     . pantoprazole (PROTONIX) 20 MG tablet Take 1 tablet (20 mg total) by mouth daily. Need annual exam with labs for further refills 90 tablet 0  . Psyllium (VEGETABLE LAXATIVE PO) Take 14 tablets by mouth at bedtime.     . triamterene-hydrochlorothiazide (MAXZIDE-25) 37.5-25 MG tablet Take 1 tablet by mouth daily. Need annual appointment for further refills 90 tablet 0  . temazepam (RESTORIL) 7.5 MG capsule Take 1 capsule (7.5 mg total) by mouth at bedtime as needed for sleep. 30 capsule 0  . loratadine (CLARITIN) 10 MG tablet Take 10 mg by mouth daily. Reported on 03/11/2015    . methocarbamol (ROBAXIN) 500 MG tablet Take 1 tablet (500 mg total) by mouth every 8 (eight) hours as needed for muscle spasms. 40 tablet 0  . potassium chloride SA (K-DUR,KLOR-CON) 20 MEQ tablet TAKE 1 TABLET BY MOUTH DAILY 90 tablet 1  . predniSONE (DELTASONE) 50 MG tablet Take one a day (Patient not  taking: Reported on 08/24/2017) 5 tablet 0   No facility-administered medications prior to visit.     ROS Review of Systems  Constitutional: Negative for chills, fatigue, fever and unexpected weight change.  HENT: Positive for congestion, postnasal drip, rhinorrhea and sneezing. Negative for sinus pressure, sinus pain and sore throat.   Eyes: Negative for visual disturbance.  Respiratory: Positive for cough. Negative for chest tightness, shortness of breath and wheezing.   Cardiovascular: Negative for chest pain and palpitations.  Gastrointestinal: Negative.   Endocrine: Negative for polyphagia and polyuria.  Genitourinary: Negative.   Musculoskeletal: Positive for gait  problem. Negative for arthralgias.  Skin: Positive for rash. Negative for pallor and wound.  Allergic/Immunologic: Negative for immunocompromised state.  Neurological: Negative for weakness and headaches.  Hematological: Negative for adenopathy. Does not bruise/bleed easily.  Psychiatric/Behavioral: Positive for sleep disturbance.    Objective:  BP 126/80   Pulse 74   Ht 5\' 4"  (1.626 m)   Wt 154 lb 8 oz (70.1 kg)   SpO2 98%   BMI 26.52 kg/m   BP Readings from Last 3 Encounters:  10/19/17 126/80  10/11/17 107/63  08/24/17 103/70    Wt Readings from Last 3 Encounters:  10/19/17 154 lb 8 oz (70.1 kg)  10/11/17 150 lb (68 kg)  08/24/17 149 lb (67.6 kg)    Physical Exam  Constitutional: She is oriented to person, place, and time. She appears well-developed and well-nourished. No distress.  HENT:  Head: Normocephalic and atraumatic.  Right Ear: External ear normal.  Left Ear: External ear normal.  Mouth/Throat: Oropharynx is clear and moist. No oropharyngeal exudate.  Eyes: Pupils are equal, round, and reactive to light. Conjunctivae and EOM are normal. Right eye exhibits no discharge. Left eye exhibits no discharge. No scleral icterus.  Neck: Neck supple. No JVD present. No tracheal deviation present. No thyromegaly present.  Cardiovascular: Normal rate, regular rhythm and normal heart sounds.  Pulmonary/Chest: Effort normal and breath sounds normal.  Abdominal: Soft. Bowel sounds are normal. She exhibits no distension and no mass. There is no tenderness. There is no guarding.  Lymphadenopathy:    She has no cervical adenopathy.  Neurological: She is alert and oriented to person, place, and time.  Skin: Skin is warm and dry. She is not diaphoretic.     Psychiatric: She has a normal mood and affect. Her behavior is normal.    Lab Results  Component Value Date   WBC 6.7 10/19/2016   HGB 14.2 10/19/2016   HCT 42.6 10/19/2016   PLT 327.0 10/19/2016   GLUCOSE 91  10/19/2016   CHOL 236 (H) 10/19/2016   TRIG 129.0 10/19/2016   HDL 65.80 10/19/2016   LDLDIRECT 159.0 02/13/2014   LDLCALC 144 (H) 10/19/2016   ALT 16 10/19/2016   AST 16 10/19/2016   NA 139 10/19/2016   K 3.8 10/19/2016   CL 101 10/19/2016   CREATININE 0.93 10/19/2016   BUN 19 10/19/2016   CO2 27 10/19/2016   TSH 1.45 10/19/2016   INR 1.02 03/13/2015    Dg Chest 2 View  Result Date: 07/30/2017 CLINICAL DATA:  Upper respiratory and affection May 2019. Recurrent cough with pain. EXAM: CHEST - 2 VIEW COMPARISON:  September 25, 2016 FINDINGS: No pneumothorax. The heart, hila, and mediastinum are normal. Minimal atelectasis in the bases. No convincing focal infiltrate. No nodule or mass. No other acute abnormalities. IMPRESSION: No active cardiopulmonary disease. Electronically Signed   By: Dorise Bullion III M.D  On: 07/30/2017 17:00    Assessment & Plan:   Megan Valentine was seen today for annual exam.  Diagnoses and all orders for this visit:  Essential hypertension -     CBC -     Comprehensive metabolic panel -     Urinalysis, Routine w reflex microscopic -     Microalbumin / creatinine urine ratio  Hypothyroidism, unspecified type -     TSH  Mixed hyperlipidemia -     CBC -     Comprehensive metabolic panel -     Lipid panel  Rhus dermatitis -     methylPREDNISolone acetate (DEPO-MEDROL) injection 80 mg  Viral upper respiratory tract infection  Menopausal and female climacteric states  Screening for osteoporosis -     DG Bone Density; Future  Need for vaccination for H flu type B -     Flu vaccine HIGH DOSE PF (Fluzone High dose)  Vitamin D deficiency -     VITAMIN D 25 Hydroxy (Vit-D Deficiency, Fractures)  Primary osteoarthritis of both knees   I have discontinued Donis B. Rightmyer's loratadine, methocarbamol, temazepam, potassium chloride SA, and predniSONE. I am also having her maintain her cholecalciferol, meclizine, Cyanocobalamin (VITAMIN B12 PO), Psyllium  (VEGETABLE LAXATIVE PO), aspirin, triamterene-hydrochlorothiazide, pantoprazole, estradiol, levothyroxine, and cetirizine. We administered methylPREDNISolone acetate.  Meds ordered this encounter  Medications  . methylPREDNISolone acetate (DEPO-MEDROL) injection 80 mg   Patient was given Depo-Medrol for ruse dermatitis.  She understands that we will take 2 to 3 days for her symptoms to respond.  She was given anticipatory guidance as dermatitis.  Have referred her for a bone densitometry test to get her up-to-date on bone health.  She was given anticipatory guidance on menopausal symptoms and the DASH diet she will follow-up in 3 to 4 days if not improving.  Otherwise we will see her in 6 months.  Follow-up: Return in about 6 months (around 04/19/2018), or if symptoms worsen or fail to improve.  Libby Maw, MD

## 2017-10-23 ENCOUNTER — Encounter: Payer: PPO | Admitting: Family Medicine

## 2017-10-26 ENCOUNTER — Ambulatory Visit (INDEPENDENT_AMBULATORY_CARE_PROVIDER_SITE_OTHER)
Admission: RE | Admit: 2017-10-26 | Discharge: 2017-10-26 | Disposition: A | Discharge status: Home / Self Care | Payer: PPO | Source: Ambulatory Visit | Attending: Family Medicine | Admitting: Family Medicine

## 2017-10-26 DIAGNOSIS — Z13820 Encounter for screening for osteoporosis: Secondary | ICD-10-CM | POA: Diagnosis not present

## 2017-11-16 ENCOUNTER — Encounter: Payer: Self-pay | Admitting: Family Medicine

## 2017-11-16 ENCOUNTER — Ambulatory Visit (INDEPENDENT_AMBULATORY_CARE_PROVIDER_SITE_OTHER): Payer: PPO | Admitting: Family Medicine

## 2017-11-16 VITALS — BP 120/70 | HR 79 | Ht 64.0 in | Wt 159.0 lb

## 2017-11-16 DIAGNOSIS — I10 Essential (primary) hypertension: Secondary | ICD-10-CM | POA: Diagnosis not present

## 2017-11-16 DIAGNOSIS — E876 Hypokalemia: Secondary | ICD-10-CM

## 2017-11-16 DIAGNOSIS — J301 Allergic rhinitis due to pollen: Secondary | ICD-10-CM | POA: Diagnosis not present

## 2017-11-16 DIAGNOSIS — G4700 Insomnia, unspecified: Secondary | ICD-10-CM | POA: Diagnosis not present

## 2017-11-16 DIAGNOSIS — L219 Seborrheic dermatitis, unspecified: Secondary | ICD-10-CM | POA: Diagnosis not present

## 2017-11-16 LAB — BASIC METABOLIC PANEL
BUN: 19 mg/dL (ref 6–23)
CALCIUM: 9.1 mg/dL (ref 8.4–10.5)
CHLORIDE: 105 meq/L (ref 96–112)
CO2: 28 mEq/L (ref 19–32)
Creatinine, Ser: 1.27 mg/dL — ABNORMAL HIGH (ref 0.40–1.20)
GFR: 44.52 mL/min — ABNORMAL LOW (ref >60.00)
Glucose, Bld: 72 mg/dL (ref 70–99)
Potassium: 3.5 mEq/L (ref 3.5–5.1)
Sodium: 142 mEq/L (ref 135–145)

## 2017-11-16 MED ORDER — TRIAMCINOLONE ACETONIDE 0.1 % EX OINT: 1.0000 "application " | TOPICAL_OINTMENT | Freq: Two times a day (BID) | CUTANEOUS | 1 refills | Status: DC

## 2017-11-16 NOTE — Progress Notes (Addendum)
Subjective:  Patient ID: Megan Valentine, female    DOB: Aug 26, 1950  Age: 67 y.o. MRN: [id number]  CC: Follow-up   HPI 79 [address] presents for follow-up of her hypertension that is well controlled with the Dyazide.  She has been taking the potassium pill but is had to cut it up in the small pieces.  Potassium will be rechecked today.  She has ongoing issues with nasal congestion postnasal drip itchy watery eyes and sneezing.  She has tried the various oral antihistamines without much relief.  She has a chronically itching scalp without flaking.  She also tells of itching in her bilateral antecubital fossa.  She has no history of asthma but did have bronchitis as a child.  Her father did have asthma.  She has tried no medicines for this.  She has trouble exercising because her left knee joint needs to be replaced.  Synvisc injections really did not help. Patient has had some trouble sleeping. Would like to try something.   Outpatient Medications Prior to Visit  Medication Sig Dispense Refill  . aspirin 81 MG chewable tablet Chew 81 mg daily by mouth.    . cetirizine (ZYRTEC) 10 MG tablet Take 10 mg by mouth daily.    . cholecalciferol (VITAMIN D) 1000 UNITS tablet Take 2,000 Units by mouth daily.     . Cyanocobalamin (VITAMIN B12 PO) Take 1,000 mg by mouth daily. 2500 mg daily    . estradiol (ESTRACE) 0.5 MG tablet Take 1 tablet (0.5 mg total) by mouth daily. Need annual exam with labs for further refills 90 tablet 0  . levothyroxine (SYNTHROID, LEVOTHROID) 100 MCG tablet TAKE 1 TABLET BY MOUTH DAILY   Need annual exam with labs for further refills 90 tablet 0  . meclizine (ANTIVERT) 25 MG tablet Take 25 mg by mouth daily as needed for dizziness.     . pantoprazole (PROTONIX) 20 MG tablet Take 1 tablet (20 mg total) by mouth daily. Need annual exam with labs for further refills 90 tablet 0  . Psyllium (VEGETABLE LAXATIVE PO) Take 14 tablets by mouth at bedtime.     .  triamterene-hydrochlorothiazide (MAXZIDE-25) 37.5-25 MG tablet Take 1 tablet by mouth daily. Need annual appointment for further refills 90 tablet 0  . potassium chloride SA (K-DUR,KLOR-CON) 20 MEQ tablet Take 20 mEq by mouth 2 (two) times daily.     No facility-administered medications prior to visit.     ROS Review of Systems  Constitutional: Negative for chills, diaphoresis, fatigue, fever and unexpected weight change.  HENT: Positive for congestion, postnasal drip and sneezing. Negative for dental problem, drooling, sinus pressure, sinus pain and sore throat.   Eyes: Positive for itching. Negative for photophobia and visual disturbance.  Respiratory: Positive for cough. Negative for shortness of breath and wheezing.   Cardiovascular: Negative.   Gastrointestinal: Negative.   Endocrine: Negative for polyphagia and polyuria.  Genitourinary: Negative.   Musculoskeletal: Positive for arthralgias and gait problem.  Skin: Positive for rash. Negative for color change.  Allergic/Immunologic: Negative for immunocompromised state.  Neurological: Negative for weakness and headaches.  Hematological: Does not bruise/bleed easily.  Psychiatric/Behavioral: Negative.     Objective:  BP 120/70   Pulse 79   Ht 5\' 4"  (1.626 m)   Wt 159 lb (72.1 kg)   SpO2 98%   BMI 27.29 kg/m   BP Readings from Last 3 Encounters:  11/16/17 120/70  10/19/17 126/80  10/11/17 107/63    Wt Readings from Last  3 Encounters:  11/16/17 159 lb (72.1 kg)  10/19/17 154 lb 8 oz (70.1 kg)  10/11/17 150 lb (68 kg)    Physical Exam  Constitutional: She is oriented to person, place, and time. She appears well-developed and well-nourished. No distress.  HENT:  Head: Normocephalic and atraumatic.  Right Ear: External ear normal.  Left Ear: External ear normal.  Mouth/Throat: Oropharynx is clear and moist. No oropharyngeal exudate.  Eyes: Pupils are equal, round, and reactive to light. Conjunctivae and EOM are  normal. Right eye exhibits no discharge. Left eye exhibits no discharge. No scleral icterus.  Neck: No JVD present. No tracheal deviation present. No thyromegaly present.  Cardiovascular: Normal rate, regular rhythm and normal heart sounds.  Pulmonary/Chest: Effort normal and breath sounds normal. She has no wheezes.  Abdominal: Bowel sounds are normal.  Lymphadenopathy:    She has no cervical adenopathy.  Neurological: She is alert and oriented to person, place, and time.  Skin: Skin is dry. She is not diaphoretic.       Lab Results  Component Value Date   WBC 8.0 10/19/2017   HGB 14.8 10/19/2017   HCT 43.8 10/19/2017   PLT 337.0 10/19/2017   GLUCOSE 72 11/16/2017   CHOL 213 (H) 10/19/2017   TRIG 98.0 10/19/2017   HDL 75.50 10/19/2017   LDLDIRECT 159.0 02/13/2014   LDLCALC 117 (H) 10/19/2017   ALT 20 10/19/2017   AST 23 10/19/2017   NA 142 11/16/2017   K 3.5 11/16/2017   CL 105 11/16/2017   CREATININE 1.27 (H) 11/16/2017   BUN 19 11/16/2017   CO2 28 11/16/2017   TSH 3.06 10/19/2017   INR 1.02 03/13/2015   MICROALBUR <0.7 10/19/2017    Dg Bone Density  Result Date: 10/29/2017 Date of study: 10/26/17 Exam: DUAL X-RAY ABSORPTIOMETRY (DXA) FOR BONE MINERAL DENSITY (BMD) Instrument: [employer] Provider: PCP Indication: screening for osteoporosis Comparison: none (please note that it is not possible to compare data from different instruments) Clinical data: Pt is a 67 y.o. female with previous fractures. Results:  Lumbar spine L1-L4 Femoral neck (FN) 33% distal radius T-score 2.8 RFN: 0.4 LFN: 0.6 n/a Change in BMD from previous DXA test (%) n/a n/a n/a (*) statistically significant Assessment: the BMD is normal according to the [facility] classification for osteoporosis (see below). Fracture risk: low FRAX score: not calculated due to normal BMD Comments: the technical quality of the study is good. L2 was excluded from spine score due to degenerative change. Recommend  optimizing calcium (1200 mg/day) and vitamin D (800 IU/day) intake. No pharmacological treatment is indicated. Followup: Repeat BMD is appropriate after 2 years. WHO criteria for diagnosis of osteoporosis in postmenopausal women and in men 19 y/o or older: - normal: T-score -1.0 to + 1.0 - osteopenia/low bone density: T-score between -2.5 and -1.0 - osteoporosis: T-score below -2.5 - severe osteoporosis: T-score below -2.5 with history of fragility fracture Note: although not part of the WHO classification, the presence of a fragility fracture, regardless of the T-score, should be considered diagnostic of osteoporosis, provided other causes for the fracture have been excluded. Loura Pardon MD    Assessment & Plan:   Azeneth was seen today for follow-up.  Diagnoses and all orders for this visit:  Essential hypertension -     Basic metabolic panel  Seborrheic dermatitis -     triamcinolone ointment (KENALOG) 0.1 %; Apply 1 application topically 2 (two) times daily. To rash on arms. Avoid face and  private areas.  Hypokalemia -     Basic metabolic panel  Seasonal allergic rhinitis due to pollen  Insomnia, unspecified type -     traZODone (DESYREL) 50 MG tablet; Take 0.5-1 tablets (25-50 mg total) by mouth at bedtime as needed for sleep.   I have discontinued Edithe B. Brendlinger's potassium chloride SA. I am also having her start on triamcinolone ointment and traZODone. Additionally, I am having her maintain her cholecalciferol, meclizine, Cyanocobalamin (VITAMIN B12 PO), Psyllium (VEGETABLE LAXATIVE PO), aspirin, triamterene-hydrochlorothiazide, pantoprazole, estradiol, levothyroxine, and cetirizine.  Meds ordered this encounter  Medications  . triamcinolone ointment (KENALOG) 0.1 %    Sig: Apply 1 application topically 2 (two) times daily. To rash on arms. Avoid face and private areas.    Dispense:  80 g    Refill:  1  . traZODone (DESYREL) 50 MG tablet    Sig: Take 0.5-1 tablets (25-50 mg  total) by mouth at bedtime as needed for sleep.    Dispense:  30 tablet    Refill:  0    Patient will switch to Flonase for her allergic rhinitis symptoms.  She was given information about using this medication.  She will use triamcinolone ointment twice daily for the itching in her antecubital fossa.  She is okay with eating a banana daily in lieu of taking a large potassium pill she will follow back up in a month.  For recheck of her potassium.  Anticipatory guidance was given on seborrheic dermatitis.  She will use cold tar shampoo on her scalp.  Follow-up: Return in about 1 month (around 12/17/2017), or eat one banana each day. Libby Maw, MD

## 2017-11-16 NOTE — Patient Instructions (Signed)
Allergic Rhinitis, Adult Allergic rhinitis is an allergic reaction that affects the mucous membrane inside the nose. It causes sneezing, a runny or stuffy nose, and the feeling of mucus going down the back of the throat (postnasal drip). Allergic rhinitis can be mild to severe. There are two types of allergic rhinitis:  Seasonal. This type is also called hay fever. It happens only during certain seasons.  Perennial. This type can happen at any time of the year.  What are the causes? This condition happens when the body's defense system (immune system) responds to certain harmless substances called allergens as though they were germs.  Seasonal allergic rhinitis is triggered by pollen, which can come from grasses, trees, and weeds. Perennial allergic rhinitis may be caused by:  House dust mites.  Pet dander.  Mold spores.  What are the signs or symptoms? Symptoms of this condition include:  Sneezing.  Runny or stuffy nose (nasal congestion).  Postnasal drip.  Itchy nose.  Tearing of the eyes.  Trouble sleeping.  Daytime sleepiness.  How is this diagnosed? This condition may be diagnosed based on:  Your medical history.  A physical exam.  Tests to check for related conditions, such as: ? Asthma. ? Pink eye. ? Ear infection. ? Upper respiratory infection.  Tests to find out which allergens trigger your symptoms. These may include skin or blood tests.  How is this treated? There is no cure for this condition, but treatment can help control symptoms. Treatment may include:  Taking medicines that block allergy symptoms, such as antihistamines. Medicine may be given as a shot, nasal spray, or pill.  Avoiding the allergen.  Desensitization. This treatment involves getting ongoing shots until your body becomes less sensitive to the allergen. This treatment may be done if other treatments do not help.  If taking medicine and avoiding the allergen does not work, new,  stronger medicines may be prescribed.  Follow these instructions at home:  Find out what you are allergic to. Common allergens include smoke, dust, and pollen.  Avoid the things you are allergic to. These are some things you can do to help avoid allergens: ? Replace carpet with wood, tile, or vinyl flooring. Carpet can trap dander and dust. ? Do not smoke. Do not allow smoking in your home. ? Change your heating and air conditioning filter at least once a month. ? During allergy season:  Keep windows closed as much as possible.  Plan outdoor activities when pollen counts are lowest. This is usually during the evening hours.  When coming indoors, change clothing and shower before sitting on furniture or bedding.  Take over-the-counter and prescription medicines only as told by your health care provider.  Keep all follow-up visits as told by your health care provider. This is important. Contact a health care provider if:  You have a fever.  You develop a persistent cough.  You make whistling sounds when you breathe (you wheeze).  Your symptoms interfere with your normal daily activities. Get help right away if:  You have shortness of breath. Summary  This condition can be managed by taking medicines as directed and avoiding allergens.  Contact your health care provider if you develop a persistent cough or fever.  During allergy season, keep windows closed as much as possible. This information is not intended to replace advice given to you by your health care provider. Make sure you discuss any questions you have with your health care provider. Document Released: 10/05/2000 Document Revised: 02/18/2016  Document Reviewed: 02/18/2016 Elsevier Interactive Patient Education  2018 [employer]. Seborrheic Dermatitis, Adult Seborrheic dermatitis is a skin disease that causes red, scaly patches. It usually occurs on the scalp, and it is often called dandruff. The patches may appear  on other parts of the body. Skin patches tend to appear where there are many oil glands in the skin. Areas of the body that are commonly affected include:  Scalp.  Skin folds of the body.  Ears.  Eyebrows.  Neck.  Face.  Armpits.  The bearded area of men's faces.  The condition may come and go for no known reason, and it is often long-lasting (chronic). What are the causes? The cause of this condition is not known. What increases the risk? This condition is more likely to develop in people who:  Have certain conditions, such as: ? HIV (human immunodeficiency virus). ? AIDS (acquired immunodeficiency syndrome). ? Parkinson disease. ? Mood disorders, such as depression.  Are 49-83 years old.  What are the signs or symptoms? Symptoms of this condition include:  Thick scales on the scalp.  Redness on the face or in the armpits.  Skin that is flaky. The flakes may be white or yellow.  Skin that seems oily or dry but is not helped with moisturizers.  Itching or burning in the affected areas.  How is this diagnosed? This condition is diagnosed with a medical history and physical exam. A sample of your skin may be tested (skin biopsy). You may need to see a skin specialist (dermatologist). How is this treated? There is no cure for this condition, but treatment can help to manage the symptoms. You may get treatment to remove scales, lower the risk of skin infection, and reduce swelling or itching. Treatment may include:  Creams that reduce swelling and irritation (steroids).  Creams that reduce skin yeast.  Medicated shampoo, soaps, moisturizing creams, or ointments.  Medicated moisturizing creams or ointments.  Follow these instructions at home:  Apply over-the-counter and prescription medicines only as told by your health care provider.  Use any medicated shampoo, soaps, skin creams, or ointments only as told by your health care provider.  Keep all follow-up  visits as told by your health care provider. This is important. Contact a health care provider if:  Your symptoms do not improve with treatment.  Your symptoms get worse.  You have new symptoms. This information is not intended to replace advice given to you by your health care provider. Make sure you discuss any questions you have with your health care provider. Document Released: 01/10/2005 Document Revised: 07/31/2015 Document Reviewed: 04/30/2015 Elsevier Interactive Patient Education  2018 [employer]. Fluticasone nasal spray What is this medicine? FLUTICASONE (floo TIK a sone) is a corticosteroid. This medicine is used to treat the symptoms of allergies like sneezing, itchy red eyes, and itchy, runny, or stuffy nose. This medicine is also used to treat nasal polyps. This medicine may be used for other purposes; ask your health care provider or pharmacist if you have questions. COMMON BRAND NAME(S): Flonase, Flonase Allergy Relief, Flonase Sensimist, Veramyst, XHANCE What should I tell my health care provider before I take this medicine? They need to know if you have any of these conditions: -cataracts -glaucoma -infection, like tuberculosis, herpes, or fungal infection -recent surgery on nose or sinuses -taking a corticosteroid by mouth -an unusual or allergic reaction to fluticasone, steroids, other medicines, foods, dyes, or preservatives -pregnant or trying to get pregnant -breast-feeding How should I use  this medicine? This medicine is for use in the nose. Follow the directions on your product or prescription label. This medicine works best if used at regular intervals. Do not use more often than directed. Make sure that you are using your nasal spray correctly. After 6 months of daily use for allergies, talk to your doctor or health care professional before using it for a longer time. Ask your doctor or health care professional if you have any questions. Talk to your  pediatrician regarding the use of this medicine in children. Special care may be needed. Some products have been used for allergies in children as young as 2 years. After 2 months of daily use without a prescription in a child, talk to your pediatrician before using it for a longer time. Use of this medicine for nasal polyps is not approved in children. Overdosage: If you think you have taken too much of this medicine contact a poison control center or emergency room at once. NOTE: This medicine is only for you. Do not share this medicine with others. What if I miss a dose? If you miss a dose, use it as soon as you remember. If it is almost time for your next dose, use only that dose and continue with your regular schedule. Do not use double or extra doses. What may interact with this medicine? -certain antibiotics like clarithromycin and telithromycin -certain medicines for fungal infections like ketoconazole, itraconazole, and voriconazole -conivaptan -nefazodone -some medicines for HIV -vaccines This list may not describe all possible interactions. Give your health care provider a list of all the medicines, herbs, non-prescription drugs, or dietary supplements you use. Also tell them if you smoke, drink alcohol, or use illegal drugs. Some items may interact with your medicine. What should I watch for while using this medicine? Visit your doctor or health care professional for regular checks on your progress. Some symptoms may improve within 12 hours after starting use. Check with your doctor or health care professional if there is no improvement in your symptoms after 3 weeks of use. This medicine may increase your risk of getting an infection. Tell your doctor or health care professional if you are around anyone with measles or chickenpox, or if you develop sores or blisters that do not heal properly. What side effects may I notice from receiving this medicine? Side effects that you should  report to your doctor or health care professional as soon as possible: -allergic reactions like skin rash, itching or hives, swelling of the face, lips, or tongue -changes in vision -crusting or sores in the nose -nosebleed -signs and symptoms of infection like fever or chills; cough; sore throat -white patches or sores in the mouth or nose Side effects that usually do not require medical attention (report to your doctor or health care professional if they continue or are bothersome): -burning or irritation inside the nose or throat -cough -headache -unusual taste or smell This list may not describe all possible side effects. Call your doctor for medical advice about side effects. You may report side effects to FDA at 1-800-FDA-1088. Where should I keep my medicine? Keep out of the reach of children. Store at room temperature between 15 and 30 degrees C (59 and 86 degrees F). Avoid exposure to extreme heat, cold, or light. Throw away any unused medicine after the expiration date. NOTE: This sheet is a summary. It may not cover all possible information. If you have questions about this medicine, talk to your doctor,  pharmacist, or health care provider.  2018 Elsevier/Gold Standard (2015-10-23 14:23:12)  Hypokalemia Hypokalemia means that the amount of potassium in the blood is lower than normal.Potassium is a chemical that helps regulate the amount of fluid in the body (electrolyte). It also stimulates muscle tightening (contraction) and helps nerves work properly.Normally, most of the body's potassium is inside of cells, and only a very small amount is in the blood. Because the amount in the blood is so small, minor changes to potassium levels in the blood can be life-threatening. What are the causes? This condition may be caused by:  Antibiotic medicine.  Diarrhea or vomiting. Taking too much of a medicine that helps you have a bowel movement (laxative) can cause diarrhea and lead to  hypokalemia.  Chronic kidney disease (CKD).  Medicines that help the body get rid of excess fluid (diuretics).  Eating disorders, such as bulimia.  Low magnesium levels in the body.  Sweating a lot.  What are the signs or symptoms? Symptoms of this condition include:  Weakness.  Constipation.  Fatigue.  Muscle cramps.  Mental confusion.  Skipped heartbeats or irregular heartbeat (palpitations).  Tingling or numbness.  How is this diagnosed? This condition is diagnosed with a blood test. How is this treated? Hypokalemia can be treated by taking potassium supplements by mouth or adjusting the medicines that you take. Treatment may also include eating more foods that contain a lot of potassium. If your potassium level is very low, you may need to get potassium through an IV tube in one of your veins and be monitored in the hospital. Follow these instructions at home:  Take over-the-counter and prescription medicines only as told by your health care provider. This includes vitamins and supplements.  Eat a healthy diet. A healthy diet includes fresh fruits and vegetables, whole grains, healthy fats, and lean proteins.  If instructed, eat more foods that contain a lot of potassium, such as: ? Nuts, such as peanuts and pistachios. ? Seeds, such as sunflower seeds and pumpkin seeds. ? Peas, lentils, and lima beans. ? Whole grain and bran cereals and breads. ? Fresh fruits and vegetables, such as apricots, avocado, bananas, cantaloupe, kiwi, oranges, tomatoes, asparagus, and potatoes. ? Orange juice. ? Tomato juice. ? Red meats. ? Yogurt.  Keep all follow-up visits as told by your health care provider. This is important. Contact a health care provider if:  You have weakness that gets worse.  You feel your heart pounding or racing.  You vomit.  You have diarrhea.  You have diabetes (diabetes mellitus) and you have trouble keeping your blood sugar (glucose) in your  target range. Get help right away if:  You have chest pain.  You have shortness of breath.  You have vomiting or diarrhea that lasts for more than 2 days.  You faint. This information is not intended to replace advice given to you by your health care provider. Make sure you discuss any questions you have with your health care provider. Document Released: 01/10/2005 Document Revised: 08/29/2015 Document Reviewed: 08/29/2015 Elsevier Interactive Patient Education  2018 [employer].

## 2017-11-20 ENCOUNTER — Other Ambulatory Visit: Payer: Self-pay | Admitting: Family Medicine

## 2017-11-20 ENCOUNTER — Telehealth: Payer: Self-pay

## 2017-11-20 ENCOUNTER — Telehealth: Payer: Self-pay | Admitting: Family Medicine

## 2017-11-20 DIAGNOSIS — G4700 Insomnia, unspecified: Secondary | ICD-10-CM

## 2017-11-20 MED ORDER — TRAZODONE HCL 50 MG PO TABS: 25.0000 mg | ORAL_TABLET | Freq: Every evening | ORAL | 0 refills | Status: DC | PRN

## 2017-11-20 NOTE — Telephone Encounter (Signed)
Potassium is okay but renal function has declined. In this setting this is usually due to dehydration. Please drink plenty of fluids and we will recheck in one month.

## 2017-11-20 NOTE — Telephone Encounter (Signed)
Okay, I have sent something to the pharmacy to help her sleep.

## 2017-11-20 NOTE — Telephone Encounter (Signed)
Please advise on lab results.

## 2017-11-20 NOTE — Addendum Note (Signed)
Addended by: Jon Billings on: 11/20/2017 01:17 PM   Modules accepted: Orders

## 2017-11-20 NOTE — Telephone Encounter (Signed)
Patient is aware 

## 2017-11-20 NOTE — Telephone Encounter (Signed)
Copied from [place] [id number]. Topic: General - Other >> Nov 20, 2017 12:23 PM Yvette Rack wrote: Reason for CRM: pt calling wanting Dr Ethelene Hal to send her something to her pharmacy for sleep she states that they have talked about this she didn't want anything but now she hasn't been sleeping lately      [place], [place] [phone] (Phone) [phone] (Fax)

## 2017-11-20 NOTE — Telephone Encounter (Signed)
Informed patient of results and patient verbalized understanding.  

## 2017-11-20 NOTE — Telephone Encounter (Signed)
Copied from [place] [id number]. Topic: General - Other >> Nov 20, 2017 12:25 PM Yvette Rack wrote: Reason for CRM: pt calling about lab results

## 2017-12-14 ENCOUNTER — Ambulatory Visit (INDEPENDENT_AMBULATORY_CARE_PROVIDER_SITE_OTHER): Payer: PPO | Admitting: Family Medicine

## 2017-12-14 ENCOUNTER — Encounter: Payer: Self-pay | Admitting: Family Medicine

## 2017-12-14 VITALS — BP 110/70 | HR 74 | Ht 64.0 in | Wt 153.0 lb

## 2017-12-14 DIAGNOSIS — R609 Edema, unspecified: Secondary | ICD-10-CM | POA: Diagnosis not present

## 2017-12-14 DIAGNOSIS — I10 Essential (primary) hypertension: Secondary | ICD-10-CM | POA: Diagnosis not present

## 2017-12-14 DIAGNOSIS — J301 Allergic rhinitis due to pollen: Secondary | ICD-10-CM | POA: Diagnosis not present

## 2017-12-14 DIAGNOSIS — E876 Hypokalemia: Secondary | ICD-10-CM

## 2017-12-14 DIAGNOSIS — G4700 Insomnia, unspecified: Secondary | ICD-10-CM | POA: Diagnosis not present

## 2017-12-14 LAB — BASIC METABOLIC PANEL
BUN: 20 mg/dL (ref 6–23)
CO2: 29 mEq/L (ref 19–32)
Calcium: 9.2 mg/dL (ref 8.4–10.5)
Chloride: 101 mEq/L (ref 96–112)
Creatinine, Ser: 1.25 mg/dL — ABNORMAL HIGH (ref 0.40–1.20)
GFR: 45.33 mL/min — AB (ref >60.00)
GLUCOSE: 104 mg/dL — AB (ref 70–99)
POTASSIUM: 3 meq/L — AB (ref 3.5–5.1)
SODIUM: 140 meq/L (ref 135–145)

## 2017-12-14 MED ORDER — FLUTICASONE PROPIONATE 50 MCG/ACT NA SUSP: 1.0000 | Freq: Every day | NASAL | 5 refills | Status: DC

## 2017-12-14 MED ORDER — SUVOREXANT 10 MG PO TABS: 1.0000 | ORAL_TABLET | Freq: Every day | ORAL | 1 refills | Status: DC

## 2017-12-14 NOTE — Patient Instructions (Signed)
Suvorexant oral tablets What is this medicine? SUVOREXANT (su-vor-EX-ant) is used to treat insomnia. This medicine helps you to fall asleep and sleep through the night. This medicine may be used for other purposes; ask your health care provider or pharmacist if you have questions. COMMON BRAND NAME(S): Belsomra What should I tell my health care provider before I take this medicine? They need to know if you have any of these conditions: -depression -history of a drug or alcohol abuse problem -history of daytime sleepiness -history of sudden onset of muscle weakness (cataplexy) -liver disease -lung or breathing disease -narcolepsy -suicidal thoughts, plans, or attempt; a previous suicide attempt by you or a family member -an unusual or allergic reaction to suvorexant, other medicines, foods, dyes, or preservatives -pregnant or trying to get pregnant -breast-feeding How should I use this medicine? Take this medicine by mouth within 30 minutes of going to bed. Do not take it unless you are able to stay in bed a full night before you must be active again. Follow the directions on the prescription label. For best results, it is better to take this medicine on an empty stomach. Do not take your medicine more often than directed. Do not stop taking this medicine on your own. Always follow your doctor or health care professional's advice. A special MedGuide will be given to you by the pharmacist with each prescription and refill. Be sure to read this information carefully each time. Talk to your pediatrician regarding the use of this medicine in children. Special care may be needed. Overdosage: If you think you have taken too much of this medicine contact a poison control center or emergency room at once. NOTE: This medicine is only for you. Do not share this medicine with others. What if I miss a dose? This medicine should only be taken immediately before going to sleep. Do not take double or extra  doses. What may interact with this medicine? -alcohol -antiviral medicines for HIV or AIDS -aprepitant -carbamazepine -certain antibiotics like ciprofloxacin, clarithromycin, erythromycin, telithromycin -certain medicines for depression or psychotic disturbances -certain medicines for fungal infections like ketoconazole, posaconazole, fluconazole, or itraconazole -conivaptan -digoxin -diltiazem -grapefruit juice -imatinib -medicines for anxiety or sleep -phenytoin -rifampin -verapamil This list may not describe all possible interactions. Give your health care provider a list of all the medicines, herbs, non-prescription drugs, or dietary supplements you use. Also tell them if you smoke, drink alcohol, or use illegal drugs. Some items may interact with your medicine. What should I watch for while using this medicine? Visit your doctor or health care professional for regular checks on your progress. Keep a regular sleep schedule by going to bed at about the same time each night. Avoid caffeine-containing drinks in the evening hours. When sleep medicines are used every night for more than a few weeks, they may stop working. Do not increase the dose on your own. Talk to your doctor if your insomnia worsens or is not better within 7 to 10 days. After taking this medicine for sleep, you may get up out of bed while not being fully awake and do an activity that you do not know you are doing. The next morning, you may have no memory of the event. Activities such as driving a car ("sleep-driving"), making and eating food, talking on the phone, sexual activity, and sleep-walking have been reported. Call your doctor right away if you find out you have done any of these activities. Do not take this medicine if you have   used alcohol that evening or before bed or taken another medicine for sleep, since your risk of doing these sleep-related activities will be increased. Do not take this medicine unless you  are able to stay in bed for a full night (7 to 8 hours) and do not drive or perform other activities requiring full alertness within 8 hours of a dose. Do not drive, use machinery, or do anything that needs mental alertness the day after you take the 20 mg dose of this medicine. The use of lower doses (10 mg) also has the potential to cause driving impairment the next day. You may have a decrease in mental alertness the day after use, even if you feel that you are fully awake. Tell your doctor if you will need to perform activities requiring full alertness, such as driving, the next day. Do not stand or sit up quickly after taking this medicine, especially if you are an older patient. This reduces the risk of dizzy or fainting spells. If you or your family notice any changes in your behavior, such as new or worsening depression, thoughts of harming yourself, anxiety, other unusual or disturbing thoughts, or memory loss, call your doctor right away. What side effects may I notice from receiving this medicine? Side effects that you should report to your doctor or health care professional as soon as possible: -allergic reactions like skin rash, itching or hives, swelling of the face, lips, or tongue -confusion -depressed mood -feeling faint or lightheaded, falls -hallucinations -inability to move or speak for up to several minutes while you are going to sleep or waking up -memory loss -periods of leg weakness lasting from seconds to a few minutes -problems with balance, speaking, walking -restlessness, excitability, or feelings of agitation -unusual activities while asleep like driving, eating, making phone calls Side effects that usually do not require medical attention (report to your doctor or health care professional if they continue or are bothersome): -abnormal dreams -daytime drowsiness -diarrhea -dizziness -headache This list may not describe all possible side effects. Call your doctor for  medical advice about side effects. You may report side effects to FDA at 1-800-FDA-1088. Where should I keep my medicine? Keep out of the reach of children. This medicine can be abused. Keep your medicine in a safe place to protect it from theft. Do not share this medicine with anyone. Selling or giving away this medicine is dangerous and against the law. Store at room temperature between 15 and 30 degrees C (59 and 86 degrees F). Throw away any unused medicine after the expiration date. NOTE: This sheet is a summary. It may not cover all possible information. If you have questions about this medicine, talk to your doctor, pharmacist, or health care provider.  2018 Elsevier/Gold Standard (2015-02-12 11:54:49)  

## 2017-12-14 NOTE — Progress Notes (Addendum)
Subjective:  Patient ID: Megan Valentine, female    DOB: 17-May-1950  Age: 67 y.o. MRN: [id number]  CC: Follow-up   HPI Mckenzee B Manocchio presents for follow-up of her hypokalemia.  She has been eating a banana daily.  Her blood pressure is been well controlled on the Dyazide.  This helps lower extremity edema as well.  Trazodone was not effective and made her drowsy in the next day.  She is tried another medicine for sleep that was not effective as well but cannot quite remember it.  She has a long-standing history of insomnia.  This is been present ever since her grandson was born.  She denies depression or recent stress.  She tells that the Flonase was particularly effective for her nasal congestion postnasal drip and rhinorrhea.  She would like to remain on it and needs refills.  Outpatient Medications Prior to Visit  Medication Sig Dispense Refill  . aspirin 81 MG chewable tablet Chew 81 mg daily by mouth.    . cholecalciferol (VITAMIN D) 1000 UNITS tablet Take 2,000 Units by mouth daily.     . Cyanocobalamin (VITAMIN B12 PO) Take 1,000 mg by mouth daily. 2500 mg daily    . estradiol (ESTRACE) 0.5 MG tablet Take 1 tablet (0.5 mg total) by mouth daily. Need annual exam with labs for further refills 90 tablet 0  . levothyroxine (SYNTHROID, LEVOTHROID) 100 MCG tablet TAKE 1 TABLET BY MOUTH DAILY   Need annual exam with labs for further refills 90 tablet 0  . meclizine (ANTIVERT) 25 MG tablet Take 25 mg by mouth daily as needed for dizziness.     . pantoprazole (PROTONIX) 20 MG tablet Take 1 tablet (20 mg total) by mouth daily. Need annual exam with labs for further refills 90 tablet 0  . triamcinolone ointment (KENALOG) 0.1 % Apply 1 application topically 2 (two) times daily. To rash on arms. Avoid face and private areas. 80 g 1  . triamterene-hydrochlorothiazide (MAXZIDE-25) 37.5-25 MG tablet Take 1 tablet by mouth daily. Need annual appointment for further refills 90 tablet 0  . cetirizine  (ZYRTEC) 10 MG tablet Take 10 mg by mouth daily.    . fluticasone (FLONASE) 50 MCG/ACT nasal spray Place 1 spray into both nostrils daily.    . Psyllium (VEGETABLE LAXATIVE PO) Take 14 tablets by mouth at bedtime.     . traZODone (DESYREL) 50 MG tablet Take 0.5-1 tablets (25-50 mg total) by mouth at bedtime as needed for sleep. 30 tablet 0   No facility-administered medications prior to visit.     ROS Review of Systems  Constitutional: Negative for chills, diaphoresis, fatigue, fever and unexpected weight change.  HENT: Negative for congestion, postnasal drip, rhinorrhea, sinus pressure and sinus pain.   Eyes: Negative for photophobia and visual disturbance.  Respiratory: Negative.   Cardiovascular: Negative.   Gastrointestinal: Negative.   Endocrine: Negative for polyphagia and polyuria.  Musculoskeletal: Negative for gait problem and joint swelling.  Skin: Negative for pallor and rash.  Allergic/Immunologic: Negative for immunocompromised state.  Neurological: Negative for light-headedness and headaches.  Hematological: Does not bruise/bleed easily.  Psychiatric/Behavioral: Positive for sleep disturbance. Negative for dysphoric mood, self-injury and suicidal ideas. The patient is not nervous/anxious.    Depression screen [facility] 2/9 12/14/2017 09/09/2016  Decreased Interest 0 0  Down, Depressed, Hopeless 0 0  PHQ - 2 Score 0 0  Altered sleeping 3 -  Tired, decreased energy 0 -  Change in appetite 0 -  Feeling  bad or failure about yourself  0 -  Trouble concentrating 0 -  Moving slowly or fidgety/restless 0 -  Suicidal thoughts 0 -  PHQ-9 Score 3 -    Objective:  BP 110/70 (BP Location: Left Arm, Patient Position: Sitting, Cuff Size: Normal)   Pulse 74   Ht 5\' 4"  (1.626 m)   Wt 153 lb (69.4 kg)   SpO2 98%   BMI 26.26 kg/m   BP Readings from Last 3 Encounters:  12/14/17 110/70  11/16/17 120/70  10/19/17 126/80    Wt Readings from Last 3 Encounters:  12/14/17 153 lb  (69.4 kg)  11/16/17 159 lb (72.1 kg)  10/19/17 154 lb 8 oz (70.1 kg)    Physical Exam  Constitutional: She is oriented to person, place, and time. She appears well-developed and well-nourished. No distress.  HENT:  Head: Normocephalic and atraumatic.  Right Ear: External ear normal.  Left Ear: External ear normal.  Eyes: Conjunctivae are normal. Right eye exhibits no discharge. Left eye exhibits no discharge. No scleral icterus.  Neck: Neck supple. No JVD present. No tracheal deviation present. No thyromegaly present.  Cardiovascular: Normal rate, regular rhythm and normal heart sounds.  Pulmonary/Chest: Effort normal and breath sounds normal.  Lymphadenopathy:    She has no cervical adenopathy.  Neurological: She is alert and oriented to person, place, and time.  Skin: Skin is warm and dry. She is not diaphoretic.  Psychiatric: She has a normal mood and affect. Her behavior is normal.    Lab Results  Component Value Date   WBC 8.0 10/19/2017   HGB 14.8 10/19/2017   HCT 43.8 10/19/2017   PLT 337.0 10/19/2017   GLUCOSE 104 (H) 12/14/2017   CHOL 213 (H) 10/19/2017   TRIG 98.0 10/19/2017   HDL 75.50 10/19/2017   LDLDIRECT 159.0 02/13/2014   LDLCALC 117 (H) 10/19/2017   ALT 20 10/19/2017   AST 23 10/19/2017   NA 140 12/14/2017   K 3.0 (L) 12/14/2017   CL 101 12/14/2017   CREATININE 1.25 (H) 12/14/2017   BUN 20 12/14/2017   CO2 29 12/14/2017   TSH 3.06 10/19/2017   INR 1.02 03/13/2015   MICROALBUR <0.7 10/19/2017    Dg Bone Density  Result Date: 10/29/2017 Date of study: 10/26/17 Exam: DUAL X-RAY ABSORPTIOMETRY (DXA) FOR BONE MINERAL DENSITY (BMD) Instrument: [employer] Provider: PCP Indication: screening for osteoporosis Comparison: none (please note that it is not possible to compare data from different instruments) Clinical data: Pt is a 67 y.o. female with previous fractures. Results:  Lumbar spine L1-L4 Femoral neck (FN) 33% distal radius T-score  2.8 RFN: 0.4 LFN: 0.6 n/a Change in BMD from previous DXA test (%) n/a n/a n/a (*) statistically significant Assessment: the BMD is normal according to the [facility] classification for osteoporosis (see below). Fracture risk: low FRAX score: not calculated due to normal BMD Comments: the technical quality of the study is good. L2 was excluded from spine score due to degenerative change. Recommend optimizing calcium (1200 mg/day) and vitamin D (800 IU/day) intake. No pharmacological treatment is indicated. Followup: Repeat BMD is appropriate after 2 years. WHO criteria for diagnosis of osteoporosis in postmenopausal women and in men 70 y/o or older: - normal: T-score -1.0 to + 1.0 - osteopenia/low bone density: T-score between -2.5 and -1.0 - osteoporosis: T-score below -2.5 - severe osteoporosis: T-score below -2.5 with history of fragility fracture Note: although not part of the WHO classification, the presence of a fragility  fracture, regardless of the T-score, should be considered diagnostic of osteoporosis, provided other causes for the fracture have been excluded. Loura Pardon MD    Assessment & Plan:   Hannelore was seen today for follow-up.  Diagnoses and all orders for this visit:  Essential hypertension -     Basic metabolic panel  Hypokalemia -     Basic metabolic panel -     Potassium Chloride 40 MEQ/15ML (20%) SOLN; Take 20 mEq by mouth daily.  Insomnia, unspecified type -     Discontinue: Suvorexant (BELSOMRA) 10 MG TABS; Take 1 tablet by mouth at bedtime. As needed for sleep -     doxepin (SINEQUAN) 25 MG capsule; Take 1 capsule (25 mg total) by mouth at bedtime as needed.  Seasonal allergic rhinitis due to pollen -     fluticasone (FLONASE) 50 MCG/ACT nasal spray; Place 1 spray into both nostrils daily.  Edema, unspecified type   I have discontinued Karianne B. Platz's Psyllium (VEGETABLE LAXATIVE PO), cetirizine, traZODone, and Suvorexant. I am also having her start on Potassium Chloride  and doxepin. Additionally, I am having her maintain her cholecalciferol, meclizine, Cyanocobalamin (VITAMIN B12 PO), aspirin, triamterene-hydrochlorothiazide, pantoprazole, estradiol, levothyroxine, triamcinolone ointment, and fluticasone.  Meds ordered this encounter  Medications  . fluticasone (FLONASE) 50 MCG/ACT nasal spray    Sig: Place 1 spray into both nostrils daily.    Dispense:  18.2 g    Refill:  5  . DISCONTD: Suvorexant (BELSOMRA) 10 MG TABS    Sig: Take 1 tablet by mouth at bedtime. As needed for sleep    Dispense:  30 tablet    Refill:  1  . Potassium Chloride 40 MEQ/15ML (20%) SOLN    Sig: Take 20 mEq by mouth daily.    Dispense:  473 mL    Refill:  5  . doxepin (SINEQUAN) 25 MG capsule    Sig: Take 1 capsule (25 mg total) by mouth at bedtime as needed.    Dispense:  30 capsule    Refill:  0   Patient is much more comfortable eating a banana daily rather than taking a large potassium pill.  We will try Jerrye Noble for her insomnia.  Follow-up in a month to see how she is doing.  Anticipatory guidance was given on Belsomra.  Follow-up: Return in about 4 weeks (around 01/11/2018).  Libby Maw, MD

## 2017-12-15 ENCOUNTER — Telehealth: Payer: Self-pay

## 2017-12-15 DIAGNOSIS — N816 Rectocele: Secondary | ICD-10-CM | POA: Diagnosis not present

## 2017-12-15 DIAGNOSIS — R609 Edema, unspecified: Secondary | ICD-10-CM | POA: Insufficient documentation

## 2017-12-15 DIAGNOSIS — N8111 Cystocele, midline: Secondary | ICD-10-CM | POA: Diagnosis not present

## 2017-12-15 MED ORDER — POTASSIUM CHLORIDE 40 MEQ/15ML (20%) PO SOLN: 20.0000 meq | Freq: Every day | ORAL | 5 refills | Status: DC

## 2017-12-15 MED ORDER — DOXEPIN HCL 25 MG PO CAPS: 25.0000 mg | ORAL_CAPSULE | Freq: Every evening | ORAL | 0 refills | Status: DC | PRN

## 2017-12-15 NOTE — Telephone Encounter (Signed)
Please advise - with goodrx coupon, medication is over $300.      Copied from [place] [id number]. Topic: General - Other >> Dec 15, 2017  2:37 PM Carolyn Stare wrote:  Pt call say Dr Ethelene Hal called in  the below RX and she can not afford the 90.00 co pay and is asking if something else can be called in   [place]

## 2017-12-15 NOTE — Telephone Encounter (Signed)
Have sent an Rx for doxepin to pharm.

## 2017-12-15 NOTE — Addendum Note (Signed)
Addended by: Jon Billings on: 12/15/2017 04:40 PM   Modules accepted: Orders

## 2017-12-15 NOTE — Addendum Note (Signed)
Addended by: Jon Billings on: 12/15/2017 08:15 AM   Modules accepted: Orders

## 2017-12-18 NOTE — Telephone Encounter (Signed)
I called and left patient a voicemail letting her know that we have submitted the PA & that we will update her once we receive a response from the insurance.

## 2017-12-18 NOTE — Telephone Encounter (Signed)
PA started. Key: [id number]

## 2017-12-19 NOTE — Telephone Encounter (Signed)
Insurance denied the Doxepin. They will only cover the medication for a dx of anxiety or depression.

## 2018-01-05 ENCOUNTER — Other Ambulatory Visit: Payer: Self-pay | Admitting: Family Medicine

## 2018-01-05 ENCOUNTER — Other Ambulatory Visit: Payer: Self-pay | Admitting: Internal Medicine

## 2018-01-05 MED ORDER — TRIAMTERENE-HCTZ 37.5-25 MG PO TABS: 1.0000 | ORAL_TABLET | Freq: Every day | ORAL | 1 refills | Status: DC

## 2018-01-05 NOTE — Telephone Encounter (Signed)
Pt also needs the triamterene-hydrochlorothiazide ([id number]) 37.5-25 [place] on [employer] [address]

## 2018-01-11 ENCOUNTER — Ambulatory Visit: Payer: PPO | Admitting: Family Medicine

## 2018-01-15 ENCOUNTER — Encounter: Payer: Self-pay | Admitting: Family Medicine

## 2018-01-15 ENCOUNTER — Ambulatory Visit (INDEPENDENT_AMBULATORY_CARE_PROVIDER_SITE_OTHER): Payer: PPO | Admitting: Family Medicine

## 2018-01-15 VITALS — BP 120/76 | HR 93 | Ht 64.0 in | Wt 155.0 lb

## 2018-01-15 DIAGNOSIS — E039 Hypothyroidism, unspecified: Secondary | ICD-10-CM | POA: Diagnosis not present

## 2018-01-15 DIAGNOSIS — N1831 Chronic kidney disease, stage 3a: Secondary | ICD-10-CM | POA: Insufficient documentation

## 2018-01-15 DIAGNOSIS — N183 Chronic kidney disease, stage 3 unspecified: Secondary | ICD-10-CM

## 2018-01-15 DIAGNOSIS — I10 Essential (primary) hypertension: Secondary | ICD-10-CM

## 2018-01-15 DIAGNOSIS — G4700 Insomnia, unspecified: Secondary | ICD-10-CM

## 2018-01-15 DIAGNOSIS — R609 Edema, unspecified: Secondary | ICD-10-CM

## 2018-01-15 DIAGNOSIS — E876 Hypokalemia: Secondary | ICD-10-CM | POA: Diagnosis not present

## 2018-01-15 LAB — CBC
HEMATOCRIT: 43.3 % (ref 36.0–46.0)
Hemoglobin: 14.7 g/dL (ref 12.0–15.0)
MCHC: 33.9 g/dL (ref 30.0–36.0)
MCV: 94.4 fl (ref 78.0–100.0)
PLATELETS: 331 10*3/uL (ref 150.0–400.0)
RBC: 4.58 Mil/uL (ref 3.87–5.11)
RDW: 13.4 % (ref 11.5–15.5)
WBC: 8.6 10*3/uL (ref 4.0–10.5)

## 2018-01-15 LAB — COMPREHENSIVE METABOLIC PANEL
ALT: 13 U/L (ref 0–35)
AST: 14 U/L (ref 0–37)
Albumin: 4.2 g/dL (ref 3.5–5.2)
Alkaline Phosphatase: 63 U/L (ref 39–117)
BUN: 20 mg/dL (ref 6–23)
CO2: 30 mEq/L (ref 19–32)
Calcium: 9.6 mg/dL (ref 8.4–10.5)
Chloride: 102 mEq/L (ref 96–112)
Creatinine, Ser: 1.19 mg/dL (ref 0.40–1.20)
GFR: 47.97 mL/min — ABNORMAL LOW (ref >60.00)
Glucose, Bld: 97 mg/dL (ref 70–99)
Potassium: 4.4 mEq/L (ref 3.5–5.1)
Sodium: 141 mEq/L (ref 135–145)
Total Bilirubin: 0.5 mg/dL (ref 0.2–1.2)
Total Protein: 7 g/dL (ref 6.0–8.3)

## 2018-01-15 LAB — URINALYSIS, ROUTINE W REFLEX MICROSCOPIC
Bilirubin Urine: NEGATIVE
Hgb urine dipstick: NEGATIVE
Ketones, ur: NEGATIVE
Leukocytes, UA: NEGATIVE
Nitrite: NEGATIVE
Specific Gravity, Urine: 1.015 (ref 1.000–1.030)
Total Protein, Urine: NEGATIVE
Urine Glucose: NEGATIVE
Urobilinogen, UA: 0.2 (ref 0.0–1.0)
pH: 6 (ref 5.0–8.0)

## 2018-01-15 LAB — TSH: TSH: 1.53 u[IU]/mL (ref 0.35–4.50)

## 2018-01-15 MED ORDER — MIRTAZAPINE 7.5 MG PO TABS: 7.5000 mg | ORAL_TABLET | Freq: Every evening | ORAL | 0 refills | Status: DC | PRN

## 2018-01-15 NOTE — Progress Notes (Addendum)
Established Patient Office Visit  Subjective:  Patient ID: Megan Valentine, female    DOB: 1950/03/13  Age: 67 y.o. MRN: [id number]  CC:  Chief Complaint  Patient presents with  . Follow-up    HPI Megan Valentine presents for follow-up of multiple issues.  Blood pressures been well controlled on the Dyazide but her GFR has hovered in the mid 40s she has suffered from hypokalemia.  Keep in mind that lower extremity edema is also being treated with the Dyazide.  Dyazide could be contributing to her lower GFR.  Mention that her TSH is increased and she did note that she has been suffering from fatigue, dry skin more than usual constipation.  Energy levels have not been good.   Elected not to take the doxepin secondary to concerns for potential side effects.  Past Medical History:  Diagnosis Date  . Allergy   . Arthritis   . Cataract   . Chronic bronchitis ([place])   . Complication of anesthesia    hard to wake up  . Diverticulosis   . Environmental allergies   . GERD (gastroesophageal reflux disease)   . Hiatal hernia   . History of chicken pox   . Hyperlipidemia   . Hypothyroidism   . IBS (irritable bowel syndrome)   . Migraines   . Mitral valve prolapse   . Rectal prolapse     Past Surgical History:  Procedure Laterality Date  . ABDOMINAL HYSTERECTOMY  1999  . APPENDECTOMY  1962  . BREAST CYST ASPIRATION Bilateral   . EYE SURGERY    . JOINT REPLACEMENT     rt knee scope  . REFRACTIVE SURGERY  2000   both eyes  . TOTAL KNEE ARTHROPLASTY Right 03/24/2015   Procedure: TOTAL KNEE ARTHROPLASTY;  Surgeon: [name], MD;  Location: [facility];  Service: Orthopedics;  Laterality: Right;  . TUBAL LIGATION  1977    Family History  Problem Relation Age of Onset  . Stomach cancer Maternal Grandmother   . Hypertension Maternal Grandmother   . Diabetes Maternal Grandmother   . Stroke Maternal Grandmother   . Cancer Maternal Grandmother        Stomach cancer  . Colon cancer  Paternal Grandfather   . Heart attack Paternal Grandfather   . Hyperlipidemia Mother   . Diabetes Mother   . Hypertension Mother   . Alzheimer's disease Mother   . Diabetes Father   . Hyperlipidemia Father   . Heart disease Father   . Heart attack Father   . Stroke Paternal Grandmother     Social History   Socioeconomic History  . Marital status: Married    Spouse name: Not on file  . Number of children: Not on file  . Years of education: 38  . Highest education level: Not on file  Occupational History  . Occupation: PROOF READER    Employer: [employer]  . Financial resource strain: Not on file  . Food insecurity:    Worry: Not on file    Inability: Not on file  . Transportation needs:    Medical: Not on file    Non-medical: Not on file  Tobacco Use  . Smoking status: Former Smoker    Types: Cigarettes    Last attempt to quit: 03/23/1976    Years since quitting: 41.8  . Smokeless tobacco: Never Used  Substance and Sexual Activity  . Alcohol use: No  . Drug use: No  . Sexual  activity: Yes    Birth control/protection: Abstinence  Lifestyle  . Physical activity:    Days per week: Not on file    Minutes per session: Not on file  . Stress: Not on file  Relationships  . Social connections:    Talks on phone: Not on file    Gets together: Not on file    Attends religious service: Not on file    Active member of club or organization: Not on file    Attends meetings of clubs or organizations: Not on file    Relationship status: Not on file  . Intimate partner violence:    Fear of current or ex partner: Not on file    Emotionally abused: Not on file    Physically abused: Not on file    Forced sexual activity: Not on file  Other Topics Concern  . Not on file  Social History Narrative   Caffeine Use-yes   Regular exercise-no          Outpatient Medications Prior to Visit  Medication Sig Dispense Refill  . aspirin 81 MG chewable tablet Chew 81 mg  daily by mouth.    . cholecalciferol (VITAMIN D) 1000 UNITS tablet Take 2,000 Units by mouth daily.     . Cyanocobalamin (VITAMIN B12 PO) Take 1,000 mg by mouth daily. 2500 mg daily    . estradiol (ESTRACE) 0.5 MG tablet Take 1 tablet (0.5 mg total) by mouth daily. Need annual exam with labs for further refills 90 tablet 0  . fluticasone (FLONASE) 50 MCG/ACT nasal spray Place 1 spray into both nostrils daily. 18.2 g 5  . meclizine (ANTIVERT) 25 MG tablet Take 25 mg by mouth daily as needed for dizziness.     . pantoprazole (PROTONIX) 20 MG tablet Take 1 tablet (20 mg total) by mouth daily. 90 tablet 1  . Potassium Chloride 40 MEQ/15ML (20%) SOLN Take 20 mEq by mouth daily. 473 mL 5  . triamcinolone ointment (KENALOG) 0.1 % Apply 1 application topically 2 (two) times daily. To rash on arms. Avoid face and private areas. 80 g 1  . triamterene-hydrochlorothiazide (MAXZIDE-25) 37.5-25 MG tablet Take 1 tablet by mouth daily. 90 tablet 1  . doxepin (SINEQUAN) 25 MG capsule Take 1 capsule (25 mg total) by mouth at bedtime as needed. 30 capsule 0  . levothyroxine (SYNTHROID, LEVOTHROID) 100 MCG tablet TAKE 1 TABLET BY MOUTH DAILY....NEED ANNUAL FOR REFILLS 90 tablet 2   No facility-administered medications prior to visit.     Allergies  Allergen Reactions  . Tetracycline Hives and Itching    ROS Review of Systems  Constitutional: Positive for fatigue. Negative for chills, diaphoresis and unexpected weight change.  HENT: Negative.   Eyes: Negative for photophobia and visual disturbance.  Respiratory: Negative.   Cardiovascular: Negative.   Gastrointestinal: Negative.   Endocrine: Negative for polyphagia and polyuria.  Genitourinary: Negative for decreased urine volume, difficulty urinating and frequency.  Musculoskeletal: Negative for gait problem.  Skin: Negative for pallor and rash.  Allergic/Immunologic: Negative for immunocompromised state.  Neurological: Negative for speech difficulty  and numbness.  Hematological: Does not bruise/bleed easily.  Psychiatric/Behavioral: Positive for sleep disturbance. Negative for self-injury and suicidal ideas.      Objective:    Physical Exam  Constitutional: She is oriented to person, place, and time. She appears well-developed and well-nourished. No distress.  HENT:  Head: Normocephalic and atraumatic.  Right Ear: External ear normal.  Left Ear: External ear normal.  Mouth/Throat: Oropharynx  is clear and moist. No oropharyngeal exudate.  Eyes: Pupils are equal, round, and reactive to light. Conjunctivae are normal. Right eye exhibits no discharge. Left eye exhibits no discharge. No scleral icterus.  Neck: Neck supple. No JVD present. No tracheal deviation present. No thyromegaly present.  Cardiovascular: Normal rate, regular rhythm and normal heart sounds.  Pulmonary/Chest: Effort normal and breath sounds normal. No stridor.  Abdominal: Soft. Bowel sounds are normal.  Musculoskeletal:        General: Edema (trace) present.  Lymphadenopathy:    She has no cervical adenopathy.  Neurological: She is alert and oriented to person, place, and time.  Skin: Skin is warm and dry. She is not diaphoretic.  Psychiatric: She has a normal mood and affect. Her behavior is normal.    BP 120/76   Pulse 93   Ht 5\' 4"  (1.626 m)   Wt 155 lb (70.3 kg)   SpO2 98%   BMI 26.61 kg/m  Wt Readings from Last 3 Encounters:  01/15/18 155 lb (70.3 kg)  12/14/17 153 lb (69.4 kg)  11/16/17 159 lb (72.1 kg)   BP Readings from Last 3 Encounters:  01/15/18 120/76  12/14/17 110/70  11/16/17 120/70   Guideline developer:  UpToDate (see UpToDate for funding source) Date Released: June 2014  There are no preventive care reminders to display for this patient.  There are no preventive care reminders to display for this patient.  Lab Results  Component Value Date   TSH 1.53 01/15/2018   Lab Results  Component Value Date   WBC 8.6 01/15/2018    HGB 14.7 01/15/2018   HCT 43.3 01/15/2018   MCV 94.4 01/15/2018   PLT 331.0 01/15/2018   Lab Results  Component Value Date   NA 141 01/15/2018   K 4.4 01/15/2018   CO2 30 01/15/2018   GLUCOSE 97 01/15/2018   BUN 20 01/15/2018   CREATININE 1.19 01/15/2018   BILITOT 0.5 01/15/2018   ALKPHOS 63 01/15/2018   AST 14 01/15/2018   ALT 13 01/15/2018   PROT 7.0 01/15/2018   ALBUMIN 4.2 01/15/2018   CALCIUM 9.6 01/15/2018   ANIONGAP 10 09/25/2016   GFR 47.97 (L) 01/15/2018   Lab Results  Component Value Date   CHOL 213 (H) 10/19/2017   Lab Results  Component Value Date   HDL 75.50 10/19/2017   Lab Results  Component Value Date   LDLCALC 117 (H) 10/19/2017   Lab Results  Component Value Date   TRIG 98.0 10/19/2017   Lab Results  Component Value Date   CHOLHDL 3 10/19/2017   No results found for: HGBA1C    Assessment & Plan:   Problem List Items Addressed This Visit      Cardiovascular and Mediastinum   Essential hypertension - Primary   Relevant Orders   CBC (Completed)   Comprehensive metabolic panel (Completed)   Urinalysis, Routine w reflex microscopic (Completed)   Ambulatory referral to Nephrology     Endocrine   Hypothyroidism   Relevant Medications   levothyroxine (SYNTHROID, LEVOTHROID) 100 MCG tablet   Other Relevant Orders   TSH (Completed)     Genitourinary   CKD (chronic kidney disease) stage 3, GFR 30-59 ml/min (HCC)   Relevant Orders   Comprehensive metabolic panel (Completed)   Ambulatory referral to Nephrology     Other   Hypokalemia   Relevant Orders   Aldosterone + renin activity w/ ratio (Completed)   Insomnia   Relevant Medications   mirtazapine (REMERON)  7.5 MG tablet   Edema      Meds ordered this encounter  Medications  . mirtazapine (REMERON) 7.5 MG tablet    Sig: Take 1 tablet (7.5 mg total) by mouth at bedtime as needed.    Dispense:  30 tablet    Refill:  0  . levothyroxine (SYNTHROID, LEVOTHROID) 100 MCG tablet      Sig: TAKE 1 TABLET BY MOUTH DAILY....NEED ANNUAL FOR REFILLS    Dispense:  90 tablet    Refill:  2    Follow-up: Return in about 3 months (around 04/16/2018).

## 2018-01-20 LAB — ALDOSTERONE + RENIN ACTIVITY W/ RATIO
ALDO / PRA Ratio: 1.1 Ratio (ref 0.9–28.9)
Aldosterone: 4 ng/dL
RENIN ACTIVITY: 3.48 ng/mL/h (ref 0.25–5.82)

## 2018-01-22 MED ORDER — LEVOTHYROXINE SODIUM 100 MCG PO TABS: ORAL_TABLET | ORAL | 2 refills | Status: DC

## 2018-01-22 NOTE — Addendum Note (Signed)
Addended by: Jon Billings on: 01/22/2018 10:58 AM   Modules accepted: Orders

## 2018-02-11 ENCOUNTER — Other Ambulatory Visit: Payer: Self-pay | Admitting: Family Medicine

## 2018-02-11 DIAGNOSIS — G4700 Insomnia, unspecified: Secondary | ICD-10-CM

## 2018-02-23 ENCOUNTER — Ambulatory Visit (INDEPENDENT_AMBULATORY_CARE_PROVIDER_SITE_OTHER): Payer: PPO | Admitting: Orthopaedic Surgery

## 2018-02-23 ENCOUNTER — Encounter (INDEPENDENT_AMBULATORY_CARE_PROVIDER_SITE_OTHER): Payer: Self-pay | Admitting: Orthopaedic Surgery

## 2018-02-23 VITALS — BP 116/81 | HR 71 | Ht 64.0 in | Wt 155.0 lb

## 2018-02-23 DIAGNOSIS — M170 Bilateral primary osteoarthritis of knee: Secondary | ICD-10-CM | POA: Diagnosis not present

## 2018-02-23 NOTE — Progress Notes (Signed)
Office Visit Note   Patient: Megan Valentine           Date of Birth: 01-23-1951           MRN: [id number] Visit Date: 02/23/2018              Requested by: Libby Maw, MD [address] [place], [place] [address] PCP: Libby Maw, MD   Assessment & Plan: Visit Diagnoses:  1. Primary osteoarthritis of both knees     Plan: End-stage osteoarthritis left knee.  Megan Valentine is ready to schedule knee replacement.  She had prior right total knee replacement years ago and is doing fairly well.  She has not had good response to cortisone and Visco supplementation activities.  She is being evaluated for stage III renal disease and has to wait to be cleared  Follow-Up Instructions: Return will schedule left TKR.   Orders:  No orders of the defined types were placed in this encounter.  No orders of the defined types were placed in this encounter.     Procedures: No procedures performed   Clinical Data: No additional findings.   Subjective: Chief Complaint  Patient presents with  . Left Knee - Pain  . Knee Pain    having  on going knee pain for  has injection , gel injection, eating pad, popping , cracking   has giving away when wallking, taking tyleonl 600mg  once at night   Megan Valentine is 68 years old visited the office for further evaluation of her chronic left knee pain.  She has been previously diagnosed with advanced osteoarthritis.  She has had cortisone and Visco supplementation with only minimal response.  She is reached a point where she is having trouble ambulating and even trouble sleeping at night she has been recently diagnosed with stage III renal disease and limited in her ability to take NSAIDs port but not sure it makes much of a difference stiffness and soreness in  HPI  Review of Systems   Objective: Vital Signs: BP 116/81 (BP Location: Left Arm)   Pulse 71   Ht 5\' 4"  (1.626 m)   Wt 155 lb (70.3 kg)   BMI 26.61 kg/m    Physical Exam Constitutional:      Appearance: She is well-developed.  Eyes:     Pupils: Pupils are equal, round, and reactive to light.  Pulmonary:     Effort: Pulmonary effort is normal.  Skin:    General: Skin is warm and dry.  Neurological:     Mental Status: She is alert and oriented to person, place, and time.  Psychiatric:        Behavior: Behavior normal.     Ortho Exam awake alert and oriented x3.  Comfortable sitting.  No effusion left knee.  No instability.  Slight varus with weightbearing.  No distal edema.  No calf pain.  No popliteal fullness.  Neurologically intact.  Straight leg raise negative.  Painless range of motion of left hip.  Slight decrease extension compared to her right knee flexed over 110 degrees Specialty Comments:  No specialty comments available.  Imaging: No results found.   PMFS History: Patient Active Problem List   Diagnosis Date Noted  . CKD (chronic kidney disease) stage 3, GFR 30-59 ml/min (HCC) 01/15/2018  . Edema 12/15/2017  . Insomnia 11/20/2017  . Seborrheic dermatitis 11/16/2017  . Seasonal allergic rhinitis due to pollen 11/16/2017  . Rhus dermatitis 10/19/2017  . Viral upper  respiratory tract infection 10/19/2017  . Menopausal and female climacteric states 10/19/2017  . Vitamin D deficiency 10/19/2017  . Need for vaccination for H flu type B 10/19/2017  . Ceruminosis, right 06/12/2017  . Acute sinusitis 07/02/2015  . Constipation 03/26/2015  . Degenerative arthritis of knee, bilateral 11/17/2014  . Screening for osteoporosis 04/25/2014  . Hypokalemia 09/15/2011  . Hypothyroidism 08/21/2008  . Hyperlipidemia 08/21/2008  . Essential hypertension 08/21/2008  . MITRAL VALVE PROLAPSE 08/21/2008   Past Medical History:  Diagnosis Date  . Allergy   . Arthritis   . Cataract   . Chronic bronchitis ([place])   . Complication of anesthesia    hard to wake up  . Diverticulosis   . Environmental allergies   . GERD  (gastroesophageal reflux disease)   . Hiatal hernia   . History of chicken pox   . Hyperlipidemia   . Hypothyroidism   . IBS (irritable bowel syndrome)   . Migraines   . Mitral valve prolapse   . Rectal prolapse     Family History  Problem Relation Age of Onset  . Stomach cancer Maternal Grandmother   . Hypertension Maternal Grandmother   . Diabetes Maternal Grandmother   . Stroke Maternal Grandmother   . Cancer Maternal Grandmother        Stomach cancer  . Colon cancer Paternal Grandfather   . Heart attack Paternal Grandfather   . Hyperlipidemia Mother   . Diabetes Mother   . Hypertension Mother   . Alzheimer's disease Mother   . Diabetes Father   . Hyperlipidemia Father   . Heart disease Father   . Heart attack Father   . Stroke Paternal Grandmother     Past Surgical History:  Procedure Laterality Date  . ABDOMINAL HYSTERECTOMY  1999  . APPENDECTOMY  1962  . BREAST CYST ASPIRATION Bilateral   . EYE SURGERY    . JOINT REPLACEMENT     rt knee scope  . REFRACTIVE SURGERY  2000   both eyes  . TOTAL KNEE ARTHROPLASTY Right 03/24/2015   Procedure: TOTAL KNEE ARTHROPLASTY;  Surgeon: [name], MD;  Location: [facility];  Service: Orthopedics;  Laterality: Right;  . TUBAL LIGATION  1977   Social History   Occupational History  . Occupation: PROOF READER    Employer: [employer]  Tobacco Use  . Smoking status: Former Smoker    Types: Cigarettes    Last attempt to quit: 03/23/1976    Years since quitting: 41.9  . Smokeless tobacco: Never Used  Substance and Sexual Activity  . Alcohol use: No  . Drug use: No  . Sexual activity: Yes    Birth control/protection: Abstinence     [name], MD   Note - This record has been created using Editor, commissioning.  Chart creation errors have been sought, but may not always  have been located. Such creation errors do not reflect on  the standard of medical care.

## 2018-03-08 DIAGNOSIS — N816 Rectocele: Secondary | ICD-10-CM | POA: Diagnosis not present

## 2018-03-08 DIAGNOSIS — N8111 Cystocele, midline: Secondary | ICD-10-CM | POA: Diagnosis not present

## 2018-03-16 DIAGNOSIS — N816 Rectocele: Secondary | ICD-10-CM | POA: Diagnosis not present

## 2018-03-16 DIAGNOSIS — N393 Stress incontinence (female) (male): Secondary | ICD-10-CM | POA: Diagnosis not present

## 2018-03-16 DIAGNOSIS — N8110 Cystocele, unspecified: Secondary | ICD-10-CM | POA: Diagnosis not present

## 2018-03-21 DIAGNOSIS — E039 Hypothyroidism, unspecified: Secondary | ICD-10-CM | POA: Diagnosis not present

## 2018-03-21 DIAGNOSIS — E785 Hyperlipidemia, unspecified: Secondary | ICD-10-CM | POA: Diagnosis not present

## 2018-03-21 DIAGNOSIS — I129 Hypertensive chronic kidney disease with stage 1 through stage 4 chronic kidney disease, or unspecified chronic kidney disease: Secondary | ICD-10-CM | POA: Diagnosis not present

## 2018-03-21 DIAGNOSIS — K219 Gastro-esophageal reflux disease without esophagitis: Secondary | ICD-10-CM | POA: Diagnosis not present

## 2018-03-21 DIAGNOSIS — N183 Chronic kidney disease, stage 3 (moderate): Secondary | ICD-10-CM | POA: Diagnosis not present

## 2018-03-22 ENCOUNTER — Other Ambulatory Visit: Payer: Self-pay | Admitting: Nephrology

## 2018-03-22 DIAGNOSIS — N183 Chronic kidney disease, stage 3 unspecified: Secondary | ICD-10-CM

## 2018-03-29 ENCOUNTER — Ambulatory Visit
Admission: RE | Admit: 2018-03-29 | Discharge: 2018-03-29 | Disposition: A | Discharge status: Home / Self Care | Payer: PPO | Source: Ambulatory Visit | Attending: Nephrology | Admitting: Nephrology

## 2018-03-29 DIAGNOSIS — N183 Chronic kidney disease, stage 3 unspecified: Secondary | ICD-10-CM

## 2018-04-02 ENCOUNTER — Other Ambulatory Visit: Payer: Self-pay | Admitting: Family Medicine

## 2018-04-02 ENCOUNTER — Telehealth (INDEPENDENT_AMBULATORY_CARE_PROVIDER_SITE_OTHER): Payer: Self-pay | Admitting: Orthopaedic Surgery

## 2018-04-02 NOTE — Telephone Encounter (Signed)
I called patient and left message in reference to scheduling her left total knee surgery.  She returned call and left message stating that she will need to put off having her surgery in April. She is being seen by a doctor for "some female issues".  Patient has my name and direct number for scheduling her surgery in the future .

## 2018-04-02 NOTE — Telephone Encounter (Signed)
thanks

## 2018-04-05 DIAGNOSIS — Z961 Presence of intraocular lens: Secondary | ICD-10-CM | POA: Diagnosis not present

## 2018-04-05 DIAGNOSIS — H40013 Open angle with borderline findings, low risk, bilateral: Secondary | ICD-10-CM | POA: Diagnosis not present

## 2018-04-05 DIAGNOSIS — H26492 Other secondary cataract, left eye: Secondary | ICD-10-CM | POA: Diagnosis not present

## 2018-04-16 ENCOUNTER — Ambulatory Visit: Payer: PPO | Admitting: Family Medicine

## 2018-04-19 ENCOUNTER — Ambulatory Visit: Payer: PPO | Admitting: Family Medicine

## 2018-04-20 ENCOUNTER — Ambulatory Visit: Payer: Self-pay

## 2018-04-20 NOTE — Telephone Encounter (Signed)
Call returned to patient.  She states that she has seasonal allergies every year.  She has stuffy nose itchy watery eyes, cough, low grade fever,ear pain and a little sore throat.  She is requesting Augmentin.  She states she usually needs it when her allergy symptoms are this bad. She is using Zyzel and flonase.   Appointment scheduled per protocol.Pt is Megan Valentine with [employer]. E mail has been verified. Care advice read to patient. Pt verbalized understanding of all instructions.  Reason for Disposition . [1] Taking antihistamines > 2 days AND [2] nasal allergy symptoms interfere with sleep, school, or work  Answer Assessment - Initial Assessment Questions 1. SYMPTOM: "What's the main symptom you're concerned about?" (e.g., runny nose, stuffiness, sneezing, itching)     Stuffy nose cough burning eyes 2. SEVERITY: "How bad is it?" "What does it keep you from doing?" (e.g., sleeping, working)      moderate 3. EYES: "Are the eyes also red, watery, and itchy?"      Red watery itchy using eye gtts lumafy 4. TRIGGER: "What pollen or other allergic substance do you think is causing the symptoms?"      Pollen and perfumes 5. TREATMENT: "What medicine are you using?" "What medicine worked best in the past?"     flonase zyzal 6. OTHER SYMPTOMS: "Do you have any other symptoms?" (e.g., coughing, difficulty breathing, wheezing)     Cough rt ear pain sore throat 7. PREGNANCY: "Is there any chance you are pregnant?" "When was your last menstrual period?"     N/A  Protocols used: NASAL ALLERGIES (HAY FEVER)-A-AH

## 2018-04-23 ENCOUNTER — Ambulatory Visit (INDEPENDENT_AMBULATORY_CARE_PROVIDER_SITE_OTHER): Payer: PPO | Admitting: Family Medicine

## 2018-04-23 ENCOUNTER — Encounter: Payer: Self-pay | Admitting: Family Medicine

## 2018-04-23 VITALS — BP 109/79 | HR 76 | Temp 97.7°F | Ht 64.0 in

## 2018-04-23 DIAGNOSIS — J301 Allergic rhinitis due to pollen: Secondary | ICD-10-CM

## 2018-04-23 MED ORDER — PREDNISONE 10 MG (21) PO TBPK: ORAL_TABLET | ORAL | 0 refills | Status: DC

## 2018-04-23 MED ORDER — FLUTICASONE PROPIONATE 50 MCG/ACT NA SUSP: 1.0000 | Freq: Every day | NASAL | 5 refills | Status: DC

## 2018-04-23 NOTE — Telephone Encounter (Signed)
Patient is set up for [employer] & email sent to patient will additional instructions.

## 2018-04-23 NOTE — Progress Notes (Signed)
Established Patient Office Visit  Subjective:  Patient ID: Megan Valentine, female    DOB: 1951/01/04  Age: 68 y.o. MRN: [id number]  CC:  Chief Complaint  Patient presents with  . Allergies    no fever, gets this every year. Has been using Flonase & Zyzal. Stuffy nose, cough, burning & itchy eyes, right ear pain, sore throat.     HPI Megan Valentine presents for patient has been sneezing and intermittent postnasal drip itchy watery eyes ears nose and throat over the last 2 to 3 weeks.  She reports compliance with her allergy medicines including Flonase that she has been taking daily.  She has had some bloody clots come out but denies facial pressure or teeth pain or purulent rhinorrhea at this time.  There is been no fevers chills nausea vomiting myalgias or arthralgias.  There is been no wheezing or shortness of breath.  She is having no chest pain.  Past Medical History:  Diagnosis Date  . Allergy   . Arthritis   . Cataract   . Chronic bronchitis ([place])   . Complication of anesthesia    hard to wake up  . Diverticulosis   . Environmental allergies   . GERD (gastroesophageal reflux disease)   . Hiatal hernia   . History of chicken pox   . Hyperlipidemia   . Hypothyroidism   . IBS (irritable bowel syndrome)   . Migraines   . Mitral valve prolapse   . Rectal prolapse     Past Surgical History:  Procedure Laterality Date  . ABDOMINAL HYSTERECTOMY  1999  . APPENDECTOMY  1962  . BREAST CYST ASPIRATION Bilateral   . EYE SURGERY    . JOINT REPLACEMENT     rt knee scope  . REFRACTIVE SURGERY  2000   both eyes  . TOTAL KNEE ARTHROPLASTY Right 03/24/2015   Procedure: TOTAL KNEE ARTHROPLASTY;  Surgeon: [name], MD;  Location: [facility];  Service: Orthopedics;  Laterality: Right;  . TUBAL LIGATION  1977    Family History  Problem Relation Age of Onset  . Stomach cancer Maternal Grandmother   . Hypertension Maternal Grandmother   . Diabetes Maternal Grandmother   .  Stroke Maternal Grandmother   . Cancer Maternal Grandmother        Stomach cancer  . Colon cancer Paternal Grandfather   . Heart attack Paternal Grandfather   . Hyperlipidemia Mother   . Diabetes Mother   . Hypertension Mother   . Alzheimer's disease Mother   . Diabetes Father   . Hyperlipidemia Father   . Heart disease Father   . Heart attack Father   . Stroke Paternal Grandmother     Social History   Socioeconomic History  . Marital status: Married    Spouse name: Not on file  . Number of children: Not on file  . Years of education: 103  . Highest education level: Not on file  Occupational History  . Occupation: PROOF READER    Employer: [employer]  . Financial resource strain: Not on file  . Food insecurity:    Worry: Not on file    Inability: Not on file  . Transportation needs:    Medical: Not on file    Non-medical: Not on file  Tobacco Use  . Smoking status: Former Smoker    Types: Cigarettes    Last attempt to quit: 03/23/1976    Years since quitting: 42.1  . Smokeless tobacco:  Never Used  Substance and Sexual Activity  . Alcohol use: No  . Drug use: No  . Sexual activity: Yes    Birth control/protection: Abstinence  Lifestyle  . Physical activity:    Days per week: Not on file    Minutes per session: Not on file  . Stress: Not on file  Relationships  . Social connections:    Talks on phone: Not on file    Gets together: Not on file    Attends religious service: Not on file    Active member of club or organization: Not on file    Attends meetings of clubs or organizations: Not on file    Relationship status: Not on file  . Intimate partner violence:    Fear of current or ex partner: Not on file    Emotionally abused: Not on file    Physically abused: Not on file    Forced sexual activity: Not on file  Other Topics Concern  . Not on file  Social History Narrative   Caffeine Use-yes   Regular exercise-no          Outpatient  Medications Prior to Visit  Medication Sig Dispense Refill  . aspirin 81 MG chewable tablet Chew 81 mg daily by mouth.    . cholecalciferol (VITAMIN D) 1000 UNITS tablet Take 2,000 Units by mouth daily.     . Cyanocobalamin (VITAMIN B12 PO) Take 1,000 mg by mouth daily. 2500 mg daily    . estradiol (ESTRACE) 0.5 MG tablet Take 1 tablet (0.5 mg total) by mouth daily. 90 tablet 0  . levothyroxine (SYNTHROID, LEVOTHROID) 100 MCG tablet TAKE 1 TABLET BY MOUTH DAILY....NEED ANNUAL FOR REFILLS 90 tablet 2  . meclizine (ANTIVERT) 25 MG tablet Take 25 mg by mouth daily as needed for dizziness.     . pantoprazole (PROTONIX) 20 MG tablet Take 1 tablet (20 mg total) by mouth daily. 90 tablet 1  . triamcinolone ointment (KENALOG) 0.1 % Apply 1 application topically 2 (two) times daily. To rash on arms. Avoid face and private areas. 80 g 1  . triamterene-hydrochlorothiazide (MAXZIDE-25) 37.5-25 MG tablet Take 1 tablet by mouth daily. 90 tablet 1  . fluticasone (FLONASE) 50 MCG/ACT nasal spray Place 1 spray into both nostrils daily. 18.2 g 5   No facility-administered medications prior to visit.     Allergies  Allergen Reactions  . Tetracycline Hives and Itching    ROS Review of Systems  Constitutional: Negative for diaphoresis, fatigue, fever and unexpected weight change.  HENT: Positive for congestion, postnasal drip, rhinorrhea and sneezing. Negative for sinus pressure and sinus pain.   Respiratory: Positive for cough. Negative for chest tightness, shortness of breath and wheezing.   Cardiovascular: Negative for chest pain.  Gastrointestinal: Negative.   Musculoskeletal: Negative for myalgias.  Allergic/Immunologic: Negative for immunocompromised state.  Hematological: Does not bruise/bleed easily.  Psychiatric/Behavioral: Negative.       Objective:    Physical Exam  Constitutional: She is oriented to person, place, and time. She appears well-developed and well-nourished. No distress.   Pulmonary/Chest: Effort normal. She exhibits no tenderness.  Neurological: She is alert and oriented to person, place, and time.  Skin: She is not diaphoretic.  Psychiatric: She has a normal mood and affect. Her behavior is normal.    BP 109/79   Pulse 76   Temp 97.7 F (36.5 C) (Oral)   Ht 5\' 4"  (1.626 m)   BMI 26.61 kg/m  Wt Readings from Last 3  Encounters:  02/23/18 155 lb (70.3 kg)  01/15/18 155 lb (70.3 kg)  12/14/17 153 lb (69.4 kg)     There are no preventive care reminders to display for this patient.  There are no preventive care reminders to display for this patient.  Lab Results  Component Value Date   TSH 1.53 01/15/2018   Lab Results  Component Value Date   WBC 8.6 01/15/2018   HGB 14.7 01/15/2018   HCT 43.3 01/15/2018   MCV 94.4 01/15/2018   PLT 331.0 01/15/2018   Lab Results  Component Value Date   NA 141 01/15/2018   K 4.4 01/15/2018   CO2 30 01/15/2018   GLUCOSE 97 01/15/2018   BUN 20 01/15/2018   CREATININE 1.19 01/15/2018   BILITOT 0.5 01/15/2018   ALKPHOS 63 01/15/2018   AST 14 01/15/2018   ALT 13 01/15/2018   PROT 7.0 01/15/2018   ALBUMIN 4.2 01/15/2018   CALCIUM 9.6 01/15/2018   ANIONGAP 10 09/25/2016   GFR 47.97 (L) 01/15/2018   Lab Results  Component Value Date   CHOL 213 (H) 10/19/2017   Lab Results  Component Value Date   HDL 75.50 10/19/2017   Lab Results  Component Value Date   LDLCALC 117 (H) 10/19/2017   Lab Results  Component Value Date   TRIG 98.0 10/19/2017   Lab Results  Component Value Date   CHOLHDL 3 10/19/2017   No results found for: HGBA1C    Assessment & Plan:   Problem List Items Addressed This Visit      Respiratory   Seasonal allergic rhinitis due to pollen - Primary   Relevant Medications   predniSONE (STERAPRED UNI-PAK 21 TAB) 10 MG (21) TBPK tablet   fluticasone (FLONASE) 50 MCG/ACT nasal spray      Meds ordered this encounter  Medications  . predniSONE (STERAPRED UNI-PAK 21 TAB)  10 MG (21) TBPK tablet    Sig: Take 6 today, 5 tomorrow, 4 the next day and then 3, 2, 1 and stop    Dispense:  21 tablet    Refill:  0  . fluticasone (FLONASE) 50 MCG/ACT nasal spray    Sig: Place 1 spray into both nostrils daily.    Dispense:  18.2 g    Refill:  5    Follow-up: No follow-ups on file.    Libby Maw, MDVirtual Visit via Video Note  I connected with Megan Valentine on 04/23/18 at 11:00 AM EDT by a video enabled telemedicine application and verified that I am speaking with the correct person using two identifiers.   I discussed the limitations of evaluation and management by telemedicine and the availability of in person appointments. The patient expressed understanding and agreed to proceed.  History of Present Illness:    Observations/Objective:   Assessment and Plan:   Follow Up Instructions:    I discussed the assessment and treatment plan with the patient. The patient was provided an opportunity to ask questions and all were answered. The patient agreed with the plan and demonstrated an understanding of the instructions.   The patient was advised to call back or seek an in-person evaluation if the symptoms worsen or if the condition fails to improve as anticipated.  I provided 20 minutes of non-face-to-face time during this encounter.   Patient will call the office and schedule an appointment to follow-up for her chronic issues in the next few weeks.

## 2018-04-30 ENCOUNTER — Other Ambulatory Visit: Payer: Self-pay

## 2018-04-30 ENCOUNTER — Ambulatory Visit (INDEPENDENT_AMBULATORY_CARE_PROVIDER_SITE_OTHER): Payer: PPO | Admitting: Family Medicine

## 2018-04-30 ENCOUNTER — Encounter: Payer: Self-pay | Admitting: Family Medicine

## 2018-04-30 ENCOUNTER — Ambulatory Visit (INDEPENDENT_AMBULATORY_CARE_PROVIDER_SITE_OTHER): Payer: PPO

## 2018-04-30 VITALS — Ht 64.0 in

## 2018-04-30 DIAGNOSIS — J301 Allergic rhinitis due to pollen: Secondary | ICD-10-CM

## 2018-04-30 DIAGNOSIS — G4733 Obstructive sleep apnea (adult) (pediatric): Secondary | ICD-10-CM | POA: Diagnosis not present

## 2018-04-30 DIAGNOSIS — R05 Cough: Secondary | ICD-10-CM | POA: Diagnosis not present

## 2018-04-30 DIAGNOSIS — J069 Acute upper respiratory infection, unspecified: Secondary | ICD-10-CM | POA: Diagnosis not present

## 2018-04-30 DIAGNOSIS — R0602 Shortness of breath: Secondary | ICD-10-CM | POA: Diagnosis not present

## 2018-04-30 MED ORDER — PREDNISONE 10 MG (21) PO TBPK: ORAL_TABLET | ORAL | 0 refills | Status: DC

## 2018-04-30 MED ORDER — ALBUTEROL SULFATE HFA 108 (90 BASE) MCG/ACT IN AERS: 2.0000 | INHALATION_SPRAY | Freq: Four times a day (QID) | RESPIRATORY_TRACT | 2 refills | Status: DC | PRN

## 2018-04-30 NOTE — Progress Notes (Signed)
Established Patient Office Visit  Subjective:  Patient ID: Megan Valentine, female    DOB: [birth year]  Age: 68 y.o. MRN: [id number]  CC:  Chief Complaint  Patient presents with  . Follow-up    HPI [name] presents for evaluation and treatment of persisting cough.  Cough is been nonproductive and there is been no fever or chills.  She has experienced some mild shortness of breath but denies frank dyspnea on exertion, chest pain or tightness in the chest.  She continues to have mild postnasal drip and nasal congestion.  There have been no myalgias or arthralgias.  Mild reactive airway disease symptoms.  Past Medical History:  Diagnosis Date  . Allergy   . Arthritis   . Cataract   . Chronic bronchitis ([place])   . Complication of anesthesia    hard to wake up  . Diverticulosis   . Environmental allergies   . GERD (gastroesophageal reflux disease)   . Hiatal hernia   . History of chicken pox   . Hyperlipidemia   . Hypothyroidism   . IBS (irritable bowel syndrome)   . Migraines   . Mitral valve prolapse   . Rectal prolapse     Past Surgical History:  Procedure Laterality Date  . ABDOMINAL HYSTERECTOMY  1999  . APPENDECTOMY  1962  . BREAST CYST ASPIRATION Bilateral   . EYE SURGERY    . JOINT REPLACEMENT     rt knee scope  . REFRACTIVE SURGERY  2000   both eyes  . TOTAL KNEE ARTHROPLASTY Right 03/24/2015   Procedure: TOTAL KNEE ARTHROPLASTY;  Surgeon: [name], MD;  Location: [facility];  Service: Orthopedics;  Laterality: Right;  . TUBAL LIGATION  1977    Family History  Problem Relation Age of Onset  . Stomach cancer Maternal Grandmother   . Hypertension Maternal Grandmother   . Diabetes Maternal Grandmother   . Stroke Maternal Grandmother   . Cancer Maternal Grandmother        Stomach cancer  . Colon cancer Paternal Grandfather   . Heart attack Paternal Grandfather   . Hyperlipidemia Mother   . Diabetes Mother   . Hypertension Mother   . Alzheimer's  disease Mother   . Diabetes Father   . Hyperlipidemia Father   . Heart disease Father   . Heart attack Father   . Stroke Paternal Grandmother     Social History   Socioeconomic History  . Marital status: Married    Spouse name: Not on file  . Number of children: Not on file  . Years of education: 53  . Highest education level: Not on file  Occupational History  . Occupation: PROOF READER    Employer: [employer]  . Financial resource strain: Not on file  . Food insecurity:    Worry: Not on file    Inability: Not on file  . Transportation needs:    Medical: Not on file    Non-medical: Not on file  Tobacco Use  . Smoking status: Former Smoker    Types: Cigarettes    Last attempt to quit: 03/23/1976    Years since quitting: 42.1  . Smokeless tobacco: Never Used  Substance and Sexual Activity  . Alcohol use: No  . Drug use: No  . Sexual activity: Yes    Birth control/protection: Abstinence  Lifestyle  . Physical activity:    Days per week: Not on file    Minutes per session: Not on  file  . Stress: Not on file  Relationships  . Social connections:    Talks on phone: Not on file    Gets together: Not on file    Attends religious service: Not on file    Active member of club or organization: Not on file    Attends meetings of clubs or organizations: Not on file    Relationship status: Not on file  . Intimate partner violence:    Fear of current or ex partner: Not on file    Emotionally abused: Not on file    Physically abused: Not on file    Forced sexual activity: Not on file  Other Topics Concern  . Not on file  Social History Narrative   Caffeine Use-yes   Regular exercise-no          Outpatient Medications Prior to Visit  Medication Sig Dispense Refill  . aspirin 81 MG chewable tablet Chew 81 mg daily by mouth.    . cholecalciferol (VITAMIN D) 1000 UNITS tablet Take 2,000 Units by mouth daily.     . Cyanocobalamin (VITAMIN B12 PO) Take 1,000  mg by mouth daily. 2500 mg daily    . estradiol (ESTRACE) 0.5 MG tablet Take 1 tablet (0.5 mg total) by mouth daily. 90 tablet 0  . fluticasone (FLONASE) 50 MCG/ACT nasal spray Place 1 spray into both nostrils daily. 18.2 g 5  . levothyroxine (SYNTHROID, LEVOTHROID) 100 MCG tablet TAKE 1 TABLET BY MOUTH DAILY....NEED ANNUAL FOR REFILLS 90 tablet 2  . meclizine (ANTIVERT) 25 MG tablet Take 25 mg by mouth daily as needed for dizziness.     . pantoprazole (PROTONIX) 20 MG tablet Take 1 tablet (20 mg total) by mouth daily. 90 tablet 1  . triamcinolone ointment (KENALOG) 0.1 % Apply 1 application topically 2 (two) times daily. To rash on arms. Avoid face and private areas. 80 g 1  . triamterene-hydrochlorothiazide (MAXZIDE-25) 37.5-25 MG tablet Take 1 tablet by mouth daily. 90 tablet 1  . predniSONE (STERAPRED UNI-PAK 21 TAB) 10 MG (21) TBPK tablet Take 6 today, 5 tomorrow, 4 the next day and then 3, 2, 1 and stop 21 tablet 0   No facility-administered medications prior to visit.     Allergies  Allergen Reactions  . Tetracycline Hives and Itching    ROS Review of Systems  Constitutional: Negative for chills, diaphoresis, fatigue, fever and unexpected weight change.  HENT: Positive for congestion, postnasal drip and rhinorrhea. Negative for sinus pressure and sinus pain.   Eyes: Negative.   Respiratory: Positive for cough and shortness of breath. Negative for chest tightness and wheezing.   Cardiovascular: Negative for chest pain.  Gastrointestinal: Negative for nausea and vomiting.  Genitourinary: Negative.   Musculoskeletal: Negative for arthralgias and myalgias.  Skin: Negative for pallor and rash.  Hematological: Negative.   Psychiatric/Behavioral: Negative.       Objective:    Physical Exam  Constitutional: She is oriented to person, place, and time. No distress.  Pulmonary/Chest: Effort normal.  Neurological: She is alert and oriented to person, place, and time.  Psychiatric:  She has a normal mood and affect. Her behavior is normal.    Ht 5\' 4"  (1.626 m)   BMI 26.61 kg/m  Wt Readings from Last 3 Encounters:  02/23/18 155 lb (70.3 kg)  01/15/18 155 lb (70.3 kg)  12/14/17 153 lb (69.4 kg)     There are no preventive care reminders to display for this patient.  There are no  preventive care reminders to display for this patient.  Lab Results  Component Value Date   TSH 1.53 01/15/2018   Lab Results  Component Value Date   WBC 8.6 01/15/2018   HGB 14.7 01/15/2018   HCT 43.3 01/15/2018   MCV 94.4 01/15/2018   PLT 331.0 01/15/2018   Lab Results  Component Value Date   NA 141 01/15/2018   K 4.4 01/15/2018   CO2 30 01/15/2018   GLUCOSE 97 01/15/2018   BUN 20 01/15/2018   CREATININE 1.19 01/15/2018   BILITOT 0.5 01/15/2018   ALKPHOS 63 01/15/2018   AST 14 01/15/2018   ALT 13 01/15/2018   PROT 7.0 01/15/2018   ALBUMIN 4.2 01/15/2018   CALCIUM 9.6 01/15/2018   ANIONGAP 10 09/25/2016   GFR 47.97 (L) 01/15/2018   Lab Results  Component Value Date   CHOL 213 (H) 10/19/2017   Lab Results  Component Value Date   HDL 75.50 10/19/2017   Lab Results  Component Value Date   LDLCALC 117 (H) 10/19/2017   Lab Results  Component Value Date   TRIG 98.0 10/19/2017   Lab Results  Component Value Date   CHOLHDL 3 10/19/2017   No results found for: HGBA1C    Assessment & Plan:   Problem List Items Addressed This Visit      Respiratory   Viral upper respiratory tract infection   Relevant Orders   DG Chest 2 View   Seasonal allergic rhinitis due to pollen - Primary   Relevant Medications   predniSONE (STERAPRED UNI-PAK 21 TAB) 10 MG (21) TBPK tablet   albuterol (PROVENTIL HFA;VENTOLIN HFA) 108 (90 Base) MCG/ACT inhaler      Meds ordered this encounter  Medications  . predniSONE (STERAPRED UNI-PAK 21 TAB) 10 MG (21) TBPK tablet    Sig: Take 6 today, 5 tomorrow, 4 the next day and then 3, 2, 1 and stop    Dispense:  21 tablet     Refill:  0  . albuterol (PROVENTIL HFA;VENTOLIN HFA) 108 (90 Base) MCG/ACT inhaler    Sig: Inhale 2 puffs into the lungs every 6 (six) hours as needed for wheezing or shortness of breath.    Dispense:  1 Inhaler    Refill:  2    Follow-up: No follow-ups on file.    Libby Maw, MDVirtual Visit via Video Note  I connected with Corin B Regina on 04/30/18 at  1:00 PM EDT by a video enabled telemedicine application and verified that I am speaking with the correct person using two identifiers.   I discussed the limitations of evaluation and management by telemedicine and the availability of in person appointments. The patient expressed understanding and agreed to proceed.  History of Present Illness:    Observations/Objective:   Assessment and Plan:   Follow Up Instructions:    I discussed the assessment and treatment plan with the patient. The patient was provided an opportunity to ask questions and all were answered. The patient agreed with the plan and demonstrated an understanding of the instructions.   The patient was advised to call back or seek an in-person evaluation if the symptoms worsen or if the condition fails to improve as anticipated.  Interactive video and audio telecommunications were attempted between myself and the patient. However they failed due to the patient having technical difficulties or not having access to video capability. We continued and completed with audio only.Interactive video and audio telecommunications were attempted between myself and the patient.  However they failed due to the patient having technical difficulties or not having access to video capability. We continued and completed with audio only.  I provided 18 minutes of non-face-to-face time during this encounter.   Patient will follow-up in 1 week if not improving.

## 2018-04-30 NOTE — Addendum Note (Signed)
Addended by: Diona Foley on: 04/30/2018 02:15 PM   Modules accepted: Orders

## 2018-05-07 ENCOUNTER — Ambulatory Visit (INDEPENDENT_AMBULATORY_CARE_PROVIDER_SITE_OTHER): Payer: PPO | Admitting: Family Medicine

## 2018-05-07 ENCOUNTER — Encounter: Payer: Self-pay | Admitting: Family Medicine

## 2018-05-07 VITALS — Ht 64.0 in

## 2018-05-07 DIAGNOSIS — J4531 Mild persistent asthma with (acute) exacerbation: Secondary | ICD-10-CM | POA: Diagnosis not present

## 2018-05-07 DIAGNOSIS — J301 Allergic rhinitis due to pollen: Secondary | ICD-10-CM

## 2018-05-07 DIAGNOSIS — J45909 Unspecified asthma, uncomplicated: Secondary | ICD-10-CM | POA: Insufficient documentation

## 2018-05-07 MED ORDER — BENZONATATE 200 MG PO CAPS: 200.0000 mg | ORAL_CAPSULE | Freq: Two times a day (BID) | ORAL | 0 refills | Status: DC | PRN

## 2018-05-07 MED ORDER — PREDNISONE 10 MG (21) PO TBPK: ORAL_TABLET | ORAL | 0 refills | Status: DC

## 2018-05-07 NOTE — Progress Notes (Signed)
Virtual Visit via Video Note  I connected with Megan Valentine on 05/07/18 at 10:00 AM EDT by a video enabled telemedicine application and verified that I am speaking with the correct person using two identifiers.   I discussed the limitations of evaluation and management by telemedicine and the availability of in person appointments. The patient expressed understanding and agreed to proceed.  History of Present Illness:    Observations/Objective:   Assessment and Plan:   Follow Up Instructions:    I discussed the assessment and treatment plan with the patient. The patient was provided an opportunity to ask questions and all were answered. The patient agreed with the plan and demonstrated an understanding of the instructions.   The patient was advised to call back or seek an in-person evaluation if the symptoms worsen or if the condition fails to improve as anticipated.  I provided 15   Established Patient Office Visit  Subjective:  Patient ID: Megan Valentine, female    DOB: 10-Oct-1950  Age: 68 y.o. MRN: [id number]  CC:  Chief Complaint  Patient presents with  . Follow-up    HPI Megan Valentine presents for follow-up of her cough.  Cough did respond to the prednisone and her inhaler.  He continues to be dry and not associated with fever chills or phlegm production.  She continues to have some postnasal drip and occasional rhinorrhea but denies facial pressure teeth pain or purulence.  She tolerated the prednisone well.  She did have some jitteriness on the first day with the 60 mg dose but then that resolved at the lower doses.  Past Medical History:  Diagnosis Date  . Allergy   . Arthritis   . Cataract   . Chronic bronchitis ([place])   . Complication of anesthesia    hard to wake up  . Diverticulosis   . Environmental allergies   . GERD (gastroesophageal reflux disease)   . Hiatal hernia   . History of chicken pox   . Hyperlipidemia   . Hypothyroidism   . IBS  (irritable bowel syndrome)   . Migraines   . Mitral valve prolapse   . Rectal prolapse     Past Surgical History:  Procedure Laterality Date  . ABDOMINAL HYSTERECTOMY  1999  . APPENDECTOMY  1962  . BREAST CYST ASPIRATION Bilateral   . EYE SURGERY    . JOINT REPLACEMENT     rt knee scope  . REFRACTIVE SURGERY  2000   both eyes  . TOTAL KNEE ARTHROPLASTY Right 03/24/2015   Procedure: TOTAL KNEE ARTHROPLASTY;  Surgeon: [name], MD;  Location: [facility];  Service: Orthopedics;  Laterality: Right;  . TUBAL LIGATION  1977    Family History  Problem Relation Age of Onset  . Stomach cancer Maternal Grandmother   . Hypertension Maternal Grandmother   . Diabetes Maternal Grandmother   . Stroke Maternal Grandmother   . Cancer Maternal Grandmother        Stomach cancer  . Colon cancer Paternal Grandfather   . Heart attack Paternal Grandfather   . Hyperlipidemia Mother   . Diabetes Mother   . Hypertension Mother   . Alzheimer's disease Mother   . Diabetes Father   . Hyperlipidemia Father   . Heart disease Father   . Heart attack Father   . Stroke Paternal Grandmother     Social History   Socioeconomic History  . Marital status: Married    Spouse name: Not on file  .  Number of children: Not on file  . Years of education: 62  . Highest education level: Not on file  Occupational History  . Occupation: PROOF READER    Employer: [employer]  . Financial resource strain: Not on file  . Food insecurity:    Worry: Not on file    Inability: Not on file  . Transportation needs:    Medical: Not on file    Non-medical: Not on file  Tobacco Use  . Smoking status: Former Smoker    Types: Cigarettes    Last attempt to quit: 03/23/1976    Years since quitting: 42.1  . Smokeless tobacco: Never Used  Substance and Sexual Activity  . Alcohol use: No  . Drug use: No  . Sexual activity: Yes    Birth control/protection: Abstinence  Lifestyle  . Physical activity:     Days per week: Not on file    Minutes per session: Not on file  . Stress: Not on file  Relationships  . Social connections:    Talks on phone: Not on file    Gets together: Not on file    Attends religious service: Not on file    Active member of club or organization: Not on file    Attends meetings of clubs or organizations: Not on file    Relationship status: Not on file  . Intimate partner violence:    Fear of current or ex partner: Not on file    Emotionally abused: Not on file    Physically abused: Not on file    Forced sexual activity: Not on file  Other Topics Concern  . Not on file  Social History Narrative   Caffeine Use-yes   Regular exercise-no          Outpatient Medications Prior to Visit  Medication Sig Dispense Refill  . albuterol (PROVENTIL HFA;VENTOLIN HFA) 108 (90 Base) MCG/ACT inhaler Inhale 2 puffs into the lungs every 6 (six) hours as needed for wheezing or shortness of breath. 1 Inhaler 2  . aspirin 81 MG chewable tablet Chew 81 mg daily by mouth.    . cholecalciferol (VITAMIN D) 1000 UNITS tablet Take 2,000 Units by mouth daily.     . Cyanocobalamin (VITAMIN B12 PO) Take 1,000 mg by mouth daily. 2500 mg daily    . estradiol (ESTRACE) 0.5 MG tablet Take 1 tablet (0.5 mg total) by mouth daily. 90 tablet 0  . fluticasone (FLONASE) 50 MCG/ACT nasal spray Place 1 spray into both nostrils daily. 18.2 g 5  . levothyroxine (SYNTHROID, LEVOTHROID) 100 MCG tablet TAKE 1 TABLET BY MOUTH DAILY....NEED ANNUAL FOR REFILLS 90 tablet 2  . meclizine (ANTIVERT) 25 MG tablet Take 25 mg by mouth daily as needed for dizziness.     . pantoprazole (PROTONIX) 20 MG tablet Take 1 tablet (20 mg total) by mouth daily. 90 tablet 1  . triamcinolone ointment (KENALOG) 0.1 % Apply 1 application topically 2 (two) times daily. To rash on arms. Avoid face and private areas. 80 g 1  . triamterene-hydrochlorothiazide (MAXZIDE-25) 37.5-25 MG tablet Take 1 tablet by mouth daily. 90 tablet 1   . predniSONE (STERAPRED UNI-PAK 21 TAB) 10 MG (21) TBPK tablet Take 6 today, 5 tomorrow, 4 the next day and then 3, 2, 1 and stop 21 tablet 0   No facility-administered medications prior to visit.     Allergies  Allergen Reactions  . Tetracycline Hives and Itching    ROS Review of Systems  Constitutional: Negative for chills, diaphoresis, fatigue, fever and unexpected weight change.  HENT: Positive for congestion, postnasal drip and rhinorrhea. Negative for sinus pressure and sinus pain.   Eyes: Negative for photophobia and visual disturbance.  Respiratory: Positive for cough and wheezing. Negative for shortness of breath.   Cardiovascular: Negative.   Gastrointestinal: Negative.   Musculoskeletal: Negative for arthralgias and myalgias.  Neurological: Negative for headaches.  Hematological: Negative.       Objective:    Physical Exam  Constitutional: She is oriented to person, place, and time. She appears well-developed and well-nourished. No distress.  HENT:  Head: Normocephalic and atraumatic.  Right Ear: External ear normal.  Left Ear: External ear normal.  Eyes: Right eye exhibits no discharge. Left eye exhibits no discharge. No scleral icterus.  Neck: No JVD present. No tracheal deviation present.  Pulmonary/Chest: Effort normal. No stridor.  Neurological: She is alert and oriented to person, place, and time.  Skin: She is not diaphoretic.  Psychiatric: She has a normal mood and affect. Her behavior is normal.    Ht 5\' 4"  (1.626 m)   BMI 26.61 kg/m  Wt Readings from Last 3 Encounters:  02/23/18 155 lb (70.3 kg)  01/15/18 155 lb (70.3 kg)  12/14/17 153 lb (69.4 kg)     There are no preventive care reminders to display for this patient.  There are no preventive care reminders to display for this patient.  Lab Results  Component Value Date   TSH 1.53 01/15/2018   Lab Results  Component Value Date   WBC 8.6 01/15/2018   HGB 14.7 01/15/2018   HCT 43.3  01/15/2018   MCV 94.4 01/15/2018   PLT 331.0 01/15/2018   Lab Results  Component Value Date   NA 141 01/15/2018   K 4.4 01/15/2018   CO2 30 01/15/2018   GLUCOSE 97 01/15/2018   BUN 20 01/15/2018   CREATININE 1.19 01/15/2018   BILITOT 0.5 01/15/2018   ALKPHOS 63 01/15/2018   AST 14 01/15/2018   ALT 13 01/15/2018   PROT 7.0 01/15/2018   ALBUMIN 4.2 01/15/2018   CALCIUM 9.6 01/15/2018   ANIONGAP 10 09/25/2016   GFR 47.97 (L) 01/15/2018   Lab Results  Component Value Date   CHOL 213 (H) 10/19/2017   Lab Results  Component Value Date   HDL 75.50 10/19/2017   Lab Results  Component Value Date   LDLCALC 117 (H) 10/19/2017   Lab Results  Component Value Date   TRIG 98.0 10/19/2017   Lab Results  Component Value Date   CHOLHDL 3 10/19/2017   No results found for: HGBA1C    Assessment & Plan:   Problem List Items Addressed This Visit      Respiratory   Seasonal allergic rhinitis due to pollen   Relevant Medications   predniSONE (STERAPRED UNI-PAK 21 TAB) 10 MG (21) TBPK tablet   Reactive airway disease - Primary   Relevant Medications   predniSONE (STERAPRED UNI-PAK 21 TAB) 10 MG (21) TBPK tablet   benzonatate (TESSALON) 200 MG capsule      Meds ordered this encounter  Medications  . predniSONE (STERAPRED UNI-PAK 21 TAB) 10 MG (21) TBPK tablet    Sig: Take 6 today, 5 tomorrow, 4 the next day and then 3, 2, 1 and stopTake 6 today, 5 tomorrow, 4 the next day and then 3, 2, 1 and stop    Dispense:  21 tablet    Refill:  0  . benzonatate (TESSALON) 200  MG capsule    Sig: Take 1 capsule (200 mg total) by mouth 2 (two) times daily as needed for cough.    Dispense:  20 capsule    Refill:  0    Follow-up: Return if symptoms worsen or fail to improve.    Libby Maw, MD minutes of non-face-to-face time during this encounter.

## 2018-05-16 ENCOUNTER — Other Ambulatory Visit: Payer: Self-pay | Admitting: Family Medicine

## 2018-05-16 DIAGNOSIS — J4531 Mild persistent asthma with (acute) exacerbation: Secondary | ICD-10-CM

## 2018-05-23 ENCOUNTER — Ambulatory Visit: Payer: PPO | Admitting: *Deleted

## 2018-05-23 DIAGNOSIS — N819 Female genital prolapse, unspecified: Secondary | ICD-10-CM | POA: Insufficient documentation

## 2018-05-23 DIAGNOSIS — N393 Stress incontinence (female) (male): Secondary | ICD-10-CM | POA: Diagnosis not present

## 2018-05-23 DIAGNOSIS — K469 Unspecified abdominal hernia without obstruction or gangrene: Secondary | ICD-10-CM | POA: Diagnosis not present

## 2018-05-23 DIAGNOSIS — N816 Rectocele: Secondary | ICD-10-CM | POA: Diagnosis not present

## 2018-05-23 DIAGNOSIS — R82998 Other abnormal findings in urine: Secondary | ICD-10-CM | POA: Diagnosis not present

## 2018-05-29 NOTE — Progress Notes (Addendum)
Virtual Visit via Video Note  I connected with patient on 05/30/18 at 10:00 AM EDT by a video enabled telemedicine application and verified that I am speaking with the correct person using two identifiers.   THIS ENCOUNTER IS A VIRTUAL VISIT DUE TO COVID-19 - PATIENT WAS NOT SEEN IN THE OFFICE. PATIENT HAS CONSENTED TO VIRTUAL VISIT / TELEMEDICINE VISIT   Location of patient: home  Location of provider: office  I discussed the limitations of evaluation and management by telemedicine and the availability of in person appointments. The patient expressed understanding and agreed to proceed.   Subjective:   Megan Valentine is a 68 y.o. female who presents for Medicare Annual (Subsequent) preventive examination.  Review of Systems: No ROS.  Medicare Wellness Virtual Visit.  Visual/audio telehealth visit, UTA vital signs.   See social history for additional risk factors. Cardiac Risk Factors include: advanced age (>29men, >73 women);dyslipidemia Sleep patterns: varies Home Safety/Smoke Alarms: Feels safe in home. Smoke alarms in place.  Lives alone in 1 story home with small yorkie dog (Sparkle). Ramp coming into home.  Female:   Pap- hysterectomy in 1999       Mammo-07/11/17       Dexa scan-  10/29/17      CCS- 04/06/11. 10 yr recall     Objective:     Vitals:Unable to assess. This visit is enabled though telemedicine due to Covid 19.   Advanced Directives 05/30/2018 09/25/2016 03/13/2015  Does Patient Have a Medical Advance Directive? Yes No Yes  Type of Paramedic of [place];Living will - -  Does patient want to make changes to medical advance directive? No - Patient declined - -  Copy of [place] in Chart? Yes - validated most recent copy scanned in chart (See row information) - No - copy requested    Tobacco Social History   Tobacco Use  Smoking Status Former Smoker  . Types: Cigarettes  . Last attempt to quit: 03/23/1976  . Years  since quitting: 42.2  Smokeless Tobacco Never Used     Counseling given: Not Answered   Clinical Intake: Pain : No/denies pain     Past Medical History:  Diagnosis Date  . Allergy   . Arthritis   . Cataract   . Chronic bronchitis ([place])   . Complication of anesthesia    hard to wake up  . Diverticulosis   . Environmental allergies   . GERD (gastroesophageal reflux disease)   . Hiatal hernia   . History of chicken pox   . Hyperlipidemia   . Hypothyroidism   . IBS (irritable bowel syndrome)   . Migraines   . Mitral valve prolapse   . Rectal prolapse    Past Surgical History:  Procedure Laterality Date  . ABDOMINAL HYSTERECTOMY  1999  . APPENDECTOMY  1962  . BREAST CYST ASPIRATION Bilateral   . EYE SURGERY    . JOINT REPLACEMENT     rt knee scope  . REFRACTIVE SURGERY  2000   both eyes  . TOTAL KNEE ARTHROPLASTY Right 03/24/2015   Procedure: TOTAL KNEE ARTHROPLASTY;  Surgeon: [name], MD;  Location: [facility];  Service: Orthopedics;  Laterality: Right;  . TUBAL LIGATION  1977   Family History  Problem Relation Age of Onset  . Stomach cancer Maternal Grandmother   . Hypertension Maternal Grandmother   . Diabetes Maternal Grandmother   . Stroke Maternal Grandmother   . Cancer Maternal Grandmother  Stomach cancer  . Colon cancer Paternal Grandfather   . Heart attack Paternal Grandfather   . Hyperlipidemia Mother   . Diabetes Mother   . Hypertension Mother   . Alzheimer's disease Mother   . Diabetes Father   . Hyperlipidemia Father   . Heart disease Father   . Heart attack Father   . Stroke Paternal Grandmother    Social History   Socioeconomic History  . Marital status: Married    Spouse name: Not on file  . Number of children: Not on file  . Years of education: 63  . Highest education level: Not on file  Occupational History  . Occupation: PROOF READER    Employer: [employer]  . Financial resource strain: Not on file  .  Food insecurity:    Worry: Not on file    Inability: Not on file  . Transportation needs:    Medical: Not on file    Non-medical: Not on file  Tobacco Use  . Smoking status: Former Smoker    Types: Cigarettes    Last attempt to quit: 03/23/1976    Years since quitting: 42.2  . Smokeless tobacco: Never Used  Substance and Sexual Activity  . Alcohol use: No  . Drug use: No  . Sexual activity: Yes    Birth control/protection: Abstinence  Lifestyle  . Physical activity:    Days per week: Not on file    Minutes per session: Not on file  . Stress: Not on file  Relationships  . Social connections:    Talks on phone: Not on file    Gets together: Not on file    Attends religious service: Not on file    Active member of club or organization: Not on file    Attends meetings of clubs or organizations: Not on file    Relationship status: Not on file  Other Topics Concern  . Not on file  Social History Narrative   Caffeine Use-yes   Regular exercise-no          Outpatient Encounter Medications as of 05/30/2018  Medication Sig  . albuterol (PROVENTIL HFA;VENTOLIN HFA) 108 (90 Base) MCG/ACT inhaler Inhale 2 puffs into the lungs every 6 (six) hours as needed for wheezing or shortness of breath.  Marland Kitchen aspirin 81 MG chewable tablet Chew 81 mg daily by mouth.  . benzonatate (TESSALON) 200 MG capsule TAKE 1 CAPSULE(200 MG) BY MOUTH TWICE DAILY AS NEEDED FOR COUGH  . cholecalciferol (VITAMIN D) 1000 UNITS tablet Take 2,000 Units by mouth daily.   . Cyanocobalamin (VITAMIN B12 PO) Take 1,000 mg by mouth daily. 2500 mg daily  . estradiol (ESTRACE) 0.5 MG tablet Take 1 tablet (0.5 mg total) by mouth daily.  Marland Kitchen levothyroxine (SYNTHROID, LEVOTHROID) 100 MCG tablet TAKE 1 TABLET BY MOUTH DAILY....NEED ANNUAL FOR REFILLS  . pantoprazole (PROTONIX) 20 MG tablet Take 1 tablet (20 mg total) by mouth daily.  Marland Kitchen triamcinolone ointment (KENALOG) 0.1 % Apply 1 application topically 2 (two) times daily. To rash  on arms. Avoid face and private areas.  . triamterene-hydrochlorothiazide (MAXZIDE-25) 37.5-25 MG tablet Take 1 tablet by mouth daily.  . fluticasone (FLONASE) 50 MCG/ACT nasal spray Place 1 spray into both nostrils daily. (Patient not taking: Reported on 05/30/2018)  . meclizine (ANTIVERT) 25 MG tablet Take 25 mg by mouth daily as needed for dizziness.   . [DISCONTINUED] predniSONE (STERAPRED UNI-PAK 21 TAB) 10 MG (21) TBPK tablet Take 6 today, 5 tomorrow, 4  the next day and then 3, 2, 1 and stopTake 6 today, 5 tomorrow, 4 the next day and then 3, 2, 1 and stop   No facility-administered encounter medications on file as of 05/30/2018.     Activities of Daily Living In your present state of health, do you have any difficulty performing the following activities: 05/30/2018  Hearing? N  Vision? N  Difficulty concentrating or making decisions? N  Walking or climbing stairs? N  Dressing or bathing? N  Doing errands, shopping? N  Preparing Food and eating ? N  Using the Toilet? N  In the past six months, have you accidently leaked urine? N  Do you have problems with loss of bowel control? N  Managing your Medications? N  Managing your Finances? N  Housekeeping or managing your Housekeeping? N  Some recent data might be hidden    Patient Care Team: Libby Maw, MD as PCP - General (Family Medicine)    Assessment:   This is a routine wellness examination for Megan Valentine. Physical assessment deferred to PCP.  Exercise Activities and Dietary recommendations Current Exercise Habits: The patient does not participate in regular exercise at present, Exercise limited by: None identified Diet (meal preparation, eat out, water intake, caffeinated beverages, dairy products, fruits and vegetables): in general, a "healthy" diet  , well balanced, on average, 3 meals per day   Goals    . DIET - REDUCE PORTION SIZE    . Increase physical activity     Walk 20 min/ 3x per week        Fall Risk  Fall Risk  05/30/2018 09/09/2016  Falls in the past year? 0 No  Depression Screen PHQ 2/9 Scores 05/30/2018 12/14/2017 09/09/2016  PHQ - 2 Score 0 0 0  PHQ- 9 Score - 3 -     Cognitive Function Ad8 score reviewed for issues:  Issues making decisions:no  Less interest in hobbies / activities:no  Repeats questions, stories (family complaining):no  Trouble using ordinary gadgets (microwave, computer, phone):no  Forgets the month or year: no  Mismanaging finances: no  Remembering appts:no  Daily problems with thinking and/or memory:no Ad8 score is=0        Immunization History  Administered Date(s) Administered  . Influenza Whole 10/09/2015  . Influenza, High Dose Seasonal PF 10/19/2016, 10/19/2017  . Influenza,inj,Quad PF,6+ Mos 11/07/2013, 11/04/2014  . Influenza-Unspecified 10/25/2010  . Pneumococcal Conjugate-13 06/08/2014  . Pneumococcal Polysaccharide-23 07/09/2015  . Tdap 11/25/2002, 03/24/2013  . Zoster 08/08/2015  . Zoster Recombinat (Shingrix) 10/24/2016, 12/30/2016   Screening Tests Health Maintenance  Topic Date Due  . INFLUENZA VACCINE  08/25/2018  . MAMMOGRAM  07/11/2019  . COLONOSCOPY  04/05/2021  . TETANUS/TDAP  03/25/2023  . DEXA SCAN  Completed  . Hepatitis C Screening  Completed  . PNA vac Low Risk Adult  Completed   Plan:   See you next year!  Continue to eat heart healthy diet (full of fruits, vegetables, whole grains, lean protein, water--limit salt, fat, and sugar intake) and increase physical activity as tolerated.  Continue doing brain stimulating activities (puzzles, reading, adult coloring books, staying active) to keep memory sharp.     I have personally reviewed and noted the following in the patient's chart:   . Medical and social history . Use of alcohol, tobacco or illicit drugs  . Current medications and supplements . Functional ability and status . Nutritional status . Physical activity . Advanced directives . List of  other physicians .  Hospitalizations, surgeries, and ER visits in previous 12 months . Vitals . Screenings to include cognitive, depression, and falls . Referrals and appointments  In addition, I have reviewed and discussed with patient certain preventive protocols, quality metrics, and best practice recommendations. A written personalized care plan for preventive services as well as general preventive health recommendations were provided to patient.     Shela Nevin, [place]  05/30/2018    Reviewed and agree

## 2018-05-30 ENCOUNTER — Encounter: Payer: Self-pay | Admitting: *Deleted

## 2018-05-30 ENCOUNTER — Ambulatory Visit (INDEPENDENT_AMBULATORY_CARE_PROVIDER_SITE_OTHER): Payer: PPO | Admitting: *Deleted

## 2018-05-30 DIAGNOSIS — Z0000 Encounter for general adult medical examination without abnormal findings: Secondary | ICD-10-CM | POA: Diagnosis not present

## 2018-05-30 NOTE — Patient Instructions (Addendum)
See you next year!  Continue to eat heart healthy diet (full of fruits, vegetables, whole grains, lean protein, water--limit salt, fat, and sugar intake) and increase physical activity as tolerated.  Continue doing brain stimulating activities (puzzles, reading, adult coloring books, staying active) to keep memory sharp.    Megan Valentine , Thank you for taking time to come for your Medicare Wellness Visit. I appreciate your ongoing commitment to your health goals. Please review the following plan we discussed and let me know if I can assist you in the future.   These are the goals we discussed: Goals    . DIET - REDUCE PORTION SIZE    . Increase physical activity     Walk 20 min/ 3x per week        This is a list of the screening recommended for you and due dates:  Health Maintenance  Topic Date Due  . Flu Shot  08/25/2018  . Mammogram  07/11/2019  . Colon Cancer Screening  04/05/2021  . Tetanus Vaccine  03/25/2023  . DEXA scan (bone density measurement)  Completed  .  Hepatitis C: One time screening is recommended by Center for Disease Control  (CDC) for  adults born from [birth year] through 1965.   Completed  . Pneumonia vaccines  Completed    Health Maintenance After Age 74 After age 80, you are at a higher risk for certain long-term diseases and infections as well as injuries from falls. Falls are a major cause of broken bones and head injuries in people who are older than age 6. Getting regular preventive care can help to keep you healthy and well. Preventive care includes getting regular testing and making lifestyle changes as recommended by your health care provider. Talk with your health care provider about:  Which screenings and tests you should have. A screening is a test that checks for a disease when you have no symptoms.  A diet and exercise plan that is right for you. What should I know about screenings and tests to prevent falls? Screening and testing are the best ways to  find a health problem early. Early diagnosis and treatment give you the best chance of managing medical conditions that are common after age 36. Certain conditions and lifestyle choices may make you more likely to have a fall. Your health care provider may recommend:  Regular vision checks. Poor vision and conditions such as cataracts can make you more likely to have a fall. If you wear glasses, make sure to get your prescription updated if your vision changes.  Medicine review. Work with your health care provider to regularly review all of the medicines you are taking, including over-the-counter medicines. Ask your health care provider about any side effects that may make you more likely to have a fall. Tell your health care provider if any medicines that you take make you feel dizzy or sleepy.  Osteoporosis screening. Osteoporosis is a condition that causes the bones to get weaker. This can make the bones weak and cause them to break more easily.  Blood pressure screening. Blood pressure changes and medicines to control blood pressure can make you feel dizzy.  Strength and balance checks. Your health care provider may recommend certain tests to check your strength and balance while standing, walking, or changing positions.  Foot health exam. Foot pain and numbness, as well as not wearing proper footwear, can make you more likely to have a fall.  Depression screening. You may be more likely  to have a fall if you have a fear of falling, feel emotionally low, or feel unable to do activities that you used to do.  Alcohol use screening. Using too much alcohol can affect your balance and may make you more likely to have a fall. What actions can I take to lower my risk of falls? General instructions  Talk with your health care provider about your risks for falling. Tell your health care provider if: ? You fall. Be sure to tell your health care provider about all falls, even ones that seem  minor. ? You feel dizzy, sleepy, or off-balance.  Take over-the-counter and prescription medicines only as told by your health care provider. These include any supplements.  Eat a healthy diet and maintain a healthy weight. A healthy diet includes low-fat dairy products, low-fat (lean) meats, and fiber from whole grains, beans, and lots of fruits and vegetables. Home safety  Remove any tripping hazards, such as rugs, cords, and clutter.  Install safety equipment such as grab bars in bathrooms and safety rails on stairs.  Keep rooms and walkways well-lit. Activity   Follow a regular exercise program to stay fit. This will help you maintain your balance. Ask your health care provider what types of exercise are appropriate for you.  If you need a cane or walker, use it as recommended by your health care provider.  Wear supportive shoes that have nonskid soles. Lifestyle  Do not drink alcohol if your health care provider tells you not to drink.  If you drink alcohol, limit how much you have: ? 0-1 drink a day for women. ? 0-2 drinks a day for men.  Be aware of how much alcohol is in your drink. In the [place] one drink equals one typical bottle of beer (12 oz), one-half glass of wine (5 oz), or one shot of hard liquor (1 oz).  Do not use any products that contain nicotine or tobacco, such as cigarettes and e-cigarettes. If you need help quitting, ask your health care provider. Summary  Having a healthy lifestyle and getting preventive care can help to protect your health and wellness after age 42.  Screening and testing are the best way to find a health problem early and help you avoid having a fall. Early diagnosis and treatment give you the best chance for managing medical conditions that are more common for people who are older than age 3.  Falls are a major cause of broken bones and head injuries in people who are older than age 67. Take precautions to prevent a fall at  home.  Work with your health care provider to learn what changes you can make to improve your health and wellness and to prevent falls. This information is not intended to replace advice given to you by your health care provider. Make sure you discuss any questions you have with your health care provider. Document Released: 11/23/2016 Document Revised: 11/23/2016 Document Reviewed: 11/23/2016 Elsevier Interactive Patient Education  2019 [employer].

## 2018-06-05 ENCOUNTER — Telehealth: Payer: Self-pay | Admitting: Family Medicine

## 2018-06-05 ENCOUNTER — Encounter: Payer: Self-pay | Admitting: Family Medicine

## 2018-06-05 ENCOUNTER — Ambulatory Visit (INDEPENDENT_AMBULATORY_CARE_PROVIDER_SITE_OTHER): Payer: PPO | Admitting: Family Medicine

## 2018-06-05 ENCOUNTER — Other Ambulatory Visit: Payer: Self-pay

## 2018-06-05 ENCOUNTER — Other Ambulatory Visit: Payer: Self-pay | Admitting: Family Medicine

## 2018-06-05 VITALS — BP 110/70 | HR 91 | Temp 98.4°F | Ht 64.0 in

## 2018-06-05 DIAGNOSIS — R091 Pleurisy: Secondary | ICD-10-CM | POA: Diagnosis not present

## 2018-06-05 DIAGNOSIS — K469 Unspecified abdominal hernia without obstruction or gangrene: Secondary | ICD-10-CM | POA: Insufficient documentation

## 2018-06-05 MED ORDER — MELOXICAM 15 MG PO TABS: 15.0000 mg | ORAL_TABLET | Freq: Every day | ORAL | 0 refills | Status: DC

## 2018-06-05 NOTE — Telephone Encounter (Signed)
Copied from [place] [phone]. Topic: General - Other >> Jun 05, 2018  9:05 AM Keene Breath wrote: Reason for CRM: Patient called to schedule an appt. With Dr. Ethelene Hal.  She stated that she thinks she has bronchitis and would like to schedule an appt.  Tried to reach office 2x and line was busy.  Please call patient back at [phone]

## 2018-06-05 NOTE — Progress Notes (Signed)
Established Patient Office Visit  Subjective:  Patient ID: Megan Valentine, female    DOB: January 14, 1951  Age: 68 y.o. MRN: [id number]  CC:  Chief Complaint  Patient presents with  . fluid in lungs?    had roof replaced, workers were smoking and pt inhaled a lot of the smoke. still coughing. no fever    HPI Megan Valentine presents for evaluation and treatment of pain in her left upper chest area.  She notices the pain when she takes a deep breath or coughs.  She denies phlegm production fevers chills nausea or vomiting.  There is been no exertional component diaphoresis or palpitations.  She denies any significant wheezing or shortness of breath.  There is been no change in bowel habits myalgias or arthralgias.  She has noted some postnasal drip and is recently restarted her Flonase.  Past Medical History:  Diagnosis Date  . Allergy   . Arthritis   . Cataract   . Chronic bronchitis ([place])   . Complication of anesthesia    hard to wake up  . Diverticulosis   . Environmental allergies   . GERD (gastroesophageal reflux disease)   . Hiatal hernia   . History of chicken pox   . Hyperlipidemia   . Hypothyroidism   . IBS (irritable bowel syndrome)   . Migraines   . Mitral valve prolapse   . Rectal prolapse     Past Surgical History:  Procedure Laterality Date  . ABDOMINAL HYSTERECTOMY  1999  . APPENDECTOMY  1962  . BREAST CYST ASPIRATION Bilateral   . EYE SURGERY    . JOINT REPLACEMENT     rt knee scope  . REFRACTIVE SURGERY  2000   both eyes  . TOTAL KNEE ARTHROPLASTY Right 03/24/2015   Procedure: TOTAL KNEE ARTHROPLASTY;  Surgeon: [name], MD;  Location: [facility];  Service: Orthopedics;  Laterality: Right;  . TUBAL LIGATION  1977    Family History  Problem Relation Age of Onset  . Stomach cancer Maternal Grandmother   . Hypertension Maternal Grandmother   . Diabetes Maternal Grandmother   . Stroke Maternal Grandmother   . Cancer Maternal Grandmother    Stomach cancer  . Colon cancer Paternal Grandfather   . Heart attack Paternal Grandfather   . Hyperlipidemia Mother   . Diabetes Mother   . Hypertension Mother   . Alzheimer's disease Mother   . Diabetes Father   . Hyperlipidemia Father   . Heart disease Father   . Heart attack Father   . Stroke Paternal Grandmother     Social History   Socioeconomic History  . Marital status: Married    Spouse name: Not on file  . Number of children: Not on file  . Years of education: 48  . Highest education level: Not on file  Occupational History  . Occupation: PROOF READER    Employer: [employer]  . Financial resource strain: Not on file  . Food insecurity:    Worry: Not on file    Inability: Not on file  . Transportation needs:    Medical: Not on file    Non-medical: Not on file  Tobacco Use  . Smoking status: Former Smoker    Types: Cigarettes    Last attempt to quit: 03/23/1976    Years since quitting: 42.2  . Smokeless tobacco: Never Used  Substance and Sexual Activity  . Alcohol use: No  . Drug use: No  . Sexual  activity: Yes    Birth control/protection: Abstinence  Lifestyle  . Physical activity:    Days per week: Not on file    Minutes per session: Not on file  . Stress: Not on file  Relationships  . Social connections:    Talks on phone: Not on file    Gets together: Not on file    Attends religious service: Not on file    Active member of club or organization: Not on file    Attends meetings of clubs or organizations: Not on file    Relationship status: Not on file  . Intimate partner violence:    Fear of current or ex partner: Not on file    Emotionally abused: Not on file    Physically abused: Not on file    Forced sexual activity: Not on file  Other Topics Concern  . Not on file  Social History Narrative   Caffeine Use-yes   Regular exercise-no          Outpatient Medications Prior to Visit  Medication Sig Dispense Refill  . albuterol  (PROVENTIL HFA;VENTOLIN HFA) 108 (90 Base) MCG/ACT inhaler Inhale 2 puffs into the lungs every 6 (six) hours as needed for wheezing or shortness of breath. 1 Inhaler 2  . aspirin 81 MG chewable tablet Chew 81 mg daily by mouth.    . cholecalciferol (VITAMIN D) 1000 UNITS tablet Take 2,000 Units by mouth daily.     . Cyanocobalamin (VITAMIN B12 PO) Take 1,000 mg by mouth daily. 2500 mg daily    . estradiol (ESTRACE) 0.5 MG tablet Take 1 tablet (0.5 mg total) by mouth daily. 90 tablet 0  . fluticasone (FLONASE) 50 MCG/ACT nasal spray Place 1 spray into both nostrils daily. 18.2 g 5  . levothyroxine (SYNTHROID, LEVOTHROID) 100 MCG tablet TAKE 1 TABLET BY MOUTH DAILY....NEED ANNUAL FOR REFILLS 90 tablet 2  . meclizine (ANTIVERT) 25 MG tablet Take 25 mg by mouth daily as needed for dizziness.     . pantoprazole (PROTONIX) 20 MG tablet Take 1 tablet (20 mg total) by mouth daily. 90 tablet 1  . triamcinolone ointment (KENALOG) 0.1 % Apply 1 application topically 2 (two) times daily. To rash on arms. Avoid face and private areas. 80 g 1  . triamterene-hydrochlorothiazide (MAXZIDE-25) 37.5-25 MG tablet Take 1 tablet by mouth daily. 90 tablet 1  . benzonatate (TESSALON) 200 MG capsule TAKE 1 CAPSULE(200 MG) BY MOUTH TWICE DAILY AS NEEDED FOR COUGH 20 capsule 0   No facility-administered medications prior to visit.     Allergies  Allergen Reactions  . Tetracycline Hives and Itching    ROS Review of Systems  Constitutional: Negative for chills, diaphoresis, fatigue, fever and unexpected weight change.  HENT: Positive for postnasal drip, rhinorrhea and sneezing. Negative for congestion.   Eyes: Negative for photophobia and visual disturbance.  Respiratory: Positive for cough. Negative for chest tightness, shortness of breath and wheezing.   Cardiovascular: Positive for chest pain. Negative for palpitations and leg swelling.  Gastrointestinal: Negative for abdominal distention, abdominal pain, nausea  and vomiting.  Genitourinary: Negative.   Musculoskeletal: Negative for arthralgias and myalgias.  Skin: Negative for pallor and rash.  Allergic/Immunologic: Negative for immunocompromised state.  Neurological: Negative for headaches.  Hematological: Does not bruise/bleed easily.  Psychiatric/Behavioral: Negative.       Objective:    Physical Exam  Constitutional: She is oriented to person, place, and time. She appears well-developed and well-nourished. No distress.  HENT:  Head: Normocephalic  and atraumatic.  Right Ear: External ear normal.  Left Ear: External ear normal.  Eyes: Conjunctivae are normal. Right eye exhibits no discharge. Left eye exhibits no discharge. No scleral icterus.  Neck: No JVD present. No tracheal deviation present.  Cardiovascular: Normal rate, regular rhythm and normal heart sounds.  Pulmonary/Chest: Effort normal and breath sounds normal. No stridor.  Abdominal: Bowel sounds are normal.  Neurological: She is alert and oriented to person, place, and time.  Skin: Skin is warm and dry. She is not diaphoretic.  Psychiatric: She has a normal mood and affect. Her behavior is normal.    BP 110/70   Pulse 91   Temp 98.4 F (36.9 C) (Oral)   Ht 5\' 4"  (1.626 m)   SpO2 98%   BMI 26.61 kg/m  Wt Readings from Last 3 Encounters:  02/23/18 155 lb (70.3 kg)  01/15/18 155 lb (70.3 kg)  12/14/17 153 lb (69.4 kg)   BP Readings from Last 3 Encounters:  06/05/18 110/70  04/23/18 109/79  02/23/18 116/81   Guideline developer:  UpToDate (see UpToDate for funding source) Date Released: June 2014  There are no preventive care reminders to display for this patient.  There are no preventive care reminders to display for this patient.  Lab Results  Component Value Date   TSH 1.53 01/15/2018   Lab Results  Component Value Date   WBC 8.6 01/15/2018   HGB 14.7 01/15/2018   HCT 43.3 01/15/2018   MCV 94.4 01/15/2018   PLT 331.0 01/15/2018   Lab Results   Component Value Date   NA 141 01/15/2018   K 4.4 01/15/2018   CO2 30 01/15/2018   GLUCOSE 97 01/15/2018   BUN 20 01/15/2018   CREATININE 1.19 01/15/2018   BILITOT 0.5 01/15/2018   ALKPHOS 63 01/15/2018   AST 14 01/15/2018   ALT 13 01/15/2018   PROT 7.0 01/15/2018   ALBUMIN 4.2 01/15/2018   CALCIUM 9.6 01/15/2018   ANIONGAP 10 09/25/2016   GFR 47.97 (L) 01/15/2018   Lab Results  Component Value Date   CHOL 213 (H) 10/19/2017   Lab Results  Component Value Date   HDL 75.50 10/19/2017   Lab Results  Component Value Date   LDLCALC 117 (H) 10/19/2017   Lab Results  Component Value Date   TRIG 98.0 10/19/2017   Lab Results  Component Value Date   CHOLHDL 3 10/19/2017   No results found for: HGBA1C    Assessment & Plan:   Problem List Items Addressed This Visit      Respiratory   Pleurisy - Primary   Relevant Medications   meloxicam (MOBIC) 15 MG tablet      Meds ordered this encounter  Medications  . meloxicam (MOBIC) 15 MG tablet    Sig: Take 1 tablet (15 mg total) by mouth daily. Take for 2 weeks and then as needed.    Dispense:  30 tablet    Refill:  0    Follow-up: Return if symptoms worsen or fail to improve.

## 2018-06-05 NOTE — Patient Instructions (Signed)
Pleurisy Pleurisy is irritation and swelling (inflammation) of the linings of your lungs (pleura). This can cause pain in your chest, back, or shoulder. It can also cause trouble breathing. Follow these instructions at home: Medicines  Take over-the-counter and prescription medicines only as told by your doctor.  If you were prescribed antibiotic medicine, take it as told by your doctor. Do not stop taking the antibiotic even if you start to feel better. Activity  Rest and return to your normal activities as told by your doctor. Ask your doctor what activities are safe for you.  Do not drive or use heavy machinery while taking prescription pain medicine. General instructions   Watch for any changes in your condition.  Take deep breaths often, even if it is painful. This can help prevent lung problems.  When lying down, lie on your painful side. This may help you feel less pain.  Do not smoke. If you need help quitting, ask your doctor.  Keep all follow-up visits as told by your doctor. This is important. Contact a doctor if:  You have pain that: ? Gets worse. ? Does not get better with medicine. ? Lasts for more than 1 week.  You have a fever or chills.  You have a cough that does not get better at home.  You have trouble breathing that does not get better at home.  You cough up liquid that looks like pus (purulent secretions). Get help right away if:  Your lips, fingernails, or toenails turn dark or turn blue.  You cough up blood.  You have trouble breathing that gets worse.  You are making loud noises when you breathe (wheezing) and this gets worse.  You have pain that spreads to your neck, arms, or jaw.  You get a rash.  You throw up (vomit).  You pass out (faint). Summary  Pleurisy is irritation and swelling (inflammation) of the linings of your lungs (pleura).  Pleurisy can cause pain and trouble breathing.  If you have a cough that does not get  better at home, contact your doctor.  Get help right away if you are having trouble breathing and it is getting worse. This information is not intended to replace advice given to you by your health care provider. Make sure you discuss any questions you have with your health care provider. Document Released: 12/24/2007 Document Revised: 10/05/2015 Document Reviewed: 10/05/2015 Elsevier Interactive Patient Education  [employer].

## 2018-06-05 NOTE — Telephone Encounter (Signed)
Can we get her scheduled? Thanks!

## 2018-06-11 DIAGNOSIS — H26491 Other secondary cataract, right eye: Secondary | ICD-10-CM | POA: Diagnosis not present

## 2018-07-03 ENCOUNTER — Other Ambulatory Visit: Payer: Self-pay | Admitting: Family Medicine

## 2018-07-09 ENCOUNTER — Other Ambulatory Visit: Payer: Self-pay | Admitting: Family Medicine

## 2018-07-09 DIAGNOSIS — J301 Allergic rhinitis due to pollen: Secondary | ICD-10-CM

## 2018-07-10 ENCOUNTER — Other Ambulatory Visit: Payer: Self-pay | Admitting: Family Medicine

## 2018-07-10 DIAGNOSIS — Z1231 Encounter for screening mammogram for malignant neoplasm of breast: Secondary | ICD-10-CM

## 2018-08-20 DIAGNOSIS — N819 Female genital prolapse, unspecified: Secondary | ICD-10-CM | POA: Diagnosis not present

## 2018-08-20 DIAGNOSIS — N816 Rectocele: Secondary | ICD-10-CM | POA: Diagnosis not present

## 2018-08-20 DIAGNOSIS — N393 Stress incontinence (female) (male): Secondary | ICD-10-CM | POA: Diagnosis not present

## 2018-08-20 DIAGNOSIS — Z1159 Encounter for screening for other viral diseases: Secondary | ICD-10-CM | POA: Diagnosis not present

## 2018-08-20 DIAGNOSIS — Z01812 Encounter for preprocedural laboratory examination: Secondary | ICD-10-CM | POA: Diagnosis not present

## 2018-08-22 ENCOUNTER — Other Ambulatory Visit: Payer: Self-pay

## 2018-08-22 ENCOUNTER — Ambulatory Visit
Admission: RE | Admit: 2018-08-22 | Discharge: 2018-08-22 | Disposition: A | Discharge status: Home / Self Care | Payer: PPO | Source: Ambulatory Visit | Attending: Family Medicine | Admitting: Family Medicine

## 2018-08-22 DIAGNOSIS — Z1231 Encounter for screening mammogram for malignant neoplasm of breast: Secondary | ICD-10-CM

## 2018-08-27 DIAGNOSIS — N393 Stress incontinence (female) (male): Secondary | ICD-10-CM | POA: Diagnosis not present

## 2018-08-27 DIAGNOSIS — N816 Rectocele: Secondary | ICD-10-CM | POA: Diagnosis not present

## 2018-08-27 DIAGNOSIS — N813 Complete uterovaginal prolapse: Secondary | ICD-10-CM | POA: Diagnosis not present

## 2018-08-27 DIAGNOSIS — N812 Incomplete uterovaginal prolapse: Secondary | ICD-10-CM | POA: Diagnosis not present

## 2018-08-29 ENCOUNTER — Other Ambulatory Visit: Payer: Self-pay | Admitting: Internal Medicine

## 2018-08-29 ENCOUNTER — Other Ambulatory Visit: Payer: Self-pay | Admitting: Family Medicine

## 2018-08-29 DIAGNOSIS — R091 Pleurisy: Secondary | ICD-10-CM

## 2018-08-31 ENCOUNTER — Encounter: Payer: Self-pay | Admitting: Family Medicine

## 2018-09-04 ENCOUNTER — Encounter: Payer: Self-pay | Admitting: Family Medicine

## 2018-09-04 ENCOUNTER — Other Ambulatory Visit: Payer: Self-pay

## 2018-09-04 ENCOUNTER — Ambulatory Visit (INDEPENDENT_AMBULATORY_CARE_PROVIDER_SITE_OTHER): Payer: PPO | Admitting: Family Medicine

## 2018-09-04 VITALS — BP 126/80 | HR 73 | Ht 64.0 in | Wt 151.4 lb

## 2018-09-04 DIAGNOSIS — E039 Hypothyroidism, unspecified: Secondary | ICD-10-CM | POA: Diagnosis not present

## 2018-09-04 DIAGNOSIS — N183 Chronic kidney disease, stage 3 unspecified: Secondary | ICD-10-CM

## 2018-09-04 DIAGNOSIS — E876 Hypokalemia: Secondary | ICD-10-CM | POA: Diagnosis not present

## 2018-09-04 DIAGNOSIS — R609 Edema, unspecified: Secondary | ICD-10-CM | POA: Diagnosis not present

## 2018-09-04 MED ORDER — FUROSEMIDE 20 MG PO TABS: 20.0000 mg | ORAL_TABLET | Freq: Every day | ORAL | 3 refills | Status: DC | PRN

## 2018-09-04 NOTE — Progress Notes (Addendum)
Established Patient Office Visit  Subjective:  Patient ID: Megan Valentine, female    DOB: 1950/12/07  Age: 68 y.o. MRN: [id number]  CC:  Chief Complaint  Patient presents with  . Follow-up    HPI Megan Valentine presents for follow-up of hypotension.  Blood pressure has been running in the low 100/60 range while taking the Maxide.  She has not had any Maxide for a week.  Blood pressure remains in the normal range but her edema has returned.  She felt swelling in her lower extremities and hands.  She is status post surgery on the third for enterocele, vaginal vault prolapse and rectocele.  Recovering.  Continues to take her Synthroid daily on a fasting stomach.  Past Medical History:  Diagnosis Date  . Allergy   . Arthritis   . Cataract   . Chronic bronchitis ([place])   . Complication of anesthesia    hard to wake up  . Diverticulosis   . Environmental allergies   . GERD (gastroesophageal reflux disease)   . Hiatal hernia   . History of chicken pox   . Hyperlipidemia   . Hypothyroidism   . IBS (irritable bowel syndrome)   . Migraines   . Mitral valve prolapse   . Rectal prolapse     Past Surgical History:  Procedure Laterality Date  . ABDOMINAL HYSTERECTOMY  1999  . APPENDECTOMY  1962  . BREAST CYST ASPIRATION Bilateral   . EYE SURGERY    . JOINT REPLACEMENT     rt knee scope  . REFRACTIVE SURGERY  2000   both eyes  . TOTAL KNEE ARTHROPLASTY Right 03/24/2015   Procedure: TOTAL KNEE ARTHROPLASTY;  Surgeon: [name], MD;  Location: [facility];  Service: Orthopedics;  Laterality: Right;  . TUBAL LIGATION  1977    Family History  Problem Relation Age of Onset  . Stomach cancer Maternal Grandmother   . Hypertension Maternal Grandmother   . Diabetes Maternal Grandmother   . Stroke Maternal Grandmother   . Cancer Maternal Grandmother        Stomach cancer  . Colon cancer Paternal Grandfather   . Heart attack Paternal Grandfather   . Hyperlipidemia Mother   .  Diabetes Mother   . Hypertension Mother   . Alzheimer's disease Mother   . Diabetes Father   . Hyperlipidemia Father   . Heart disease Father   . Heart attack Father   . Stroke Paternal Grandmother     Social History   Socioeconomic History  . Marital status: Married    Spouse name: Not on file  . Number of children: Not on file  . Years of education: 62  . Highest education level: Not on file  Occupational History  . Occupation: PROOF READER    Employer: [employer]  . Financial resource strain: Not on file  . Food insecurity    Worry: Not on file    Inability: Not on file  . Transportation needs    Medical: Not on file    Non-medical: Not on file  Tobacco Use  . Smoking status: Former Smoker    Types: Cigarettes    Quit date: 03/23/1976    Years since quitting: 42.4  . Smokeless tobacco: Never Used  Substance and Sexual Activity  . Alcohol use: No  . Drug use: No  . Sexual activity: Yes    Birth control/protection: Abstinence  Lifestyle  . Physical activity    Days per  week: Not on file    Minutes per session: Not on file  . Stress: Not on file  Relationships  . Social Herbalist on phone: Not on file    Gets together: Not on file    Attends religious service: Not on file    Active member of club or organization: Not on file    Attends meetings of clubs or organizations: Not on file    Relationship status: Not on file  . Intimate partner violence    Fear of current or ex partner: Not on file    Emotionally abused: Not on file    Physically abused: Not on file    Forced sexual activity: Not on file  Other Topics Concern  . Not on file  Social History Narrative   Caffeine Use-yes   Regular exercise-no          Outpatient Medications Prior to Visit  Medication Sig Dispense Refill  . albuterol (VENTOLIN HFA) 108 (90 Base) MCG/ACT inhaler INHALE 2 PUFFS INTO THE LUNGS EVERY 6 HOURS AS NEEDED FOR WHEEZING OR SHORTNESS OF BREATH 8.5  g 2  . aspirin 81 MG chewable tablet Chew 81 mg daily by mouth.    . cholecalciferol (VITAMIN D) 1000 UNITS tablet Take 2,000 Units by mouth daily.     . Cyanocobalamin (VITAMIN B12 PO) Take 1,000 mg by mouth daily. 2500 mg daily    . estradiol (ESTRACE) 0.5 MG tablet TAKE 1 TABLET(0.5 MG) BY MOUTH DAILY 90 tablet 0  . fluticasone (FLONASE) 50 MCG/ACT nasal spray Place 1 spray into both nostrils daily. 18.2 g 5  . levothyroxine (SYNTHROID, LEVOTHROID) 100 MCG tablet TAKE 1 TABLET BY MOUTH DAILY....NEED ANNUAL FOR REFILLS 90 tablet 2  . meclizine (ANTIVERT) 25 MG tablet Take 25 mg by mouth daily as needed for dizziness.     . meloxicam (MOBIC) 15 MG tablet TAKE 1 TABLET(15 MG) BY MOUTH DAILY FOR 2 WEEKS THEN AS NEEDED 90 tablet 0  . pantoprazole (PROTONIX) 20 MG tablet TAKE 1 TABLET(20 MG) BY MOUTH DAILY 90 tablet 1  . triamcinolone ointment (KENALOG) 0.1 % Apply 1 application topically 2 (two) times daily. To rash on arms. Avoid face and private areas. 80 g 1  . triamterene-hydrochlorothiazide (MAXZIDE-25) 37.5-25 MG tablet TAKE 1 TABLET BY MOUTH DAILY 90 tablet 1   No facility-administered medications prior to visit.     Allergies  Allergen Reactions  . Tetracycline Hives and Itching    ROS Review of Systems  Constitutional: Negative for fever and unexpected weight change.  HENT: Negative.   Eyes: Negative for photophobia and visual disturbance.  Respiratory: Negative for cough, chest tightness and wheezing.   Cardiovascular: Negative.   Gastrointestinal: Negative.   Endocrine: Negative for polyphagia and polyuria.  Genitourinary: Negative.  Negative for decreased urine volume, difficulty urinating and frequency.  Musculoskeletal: Negative for joint swelling and myalgias.  Skin: Negative for pallor.  Allergic/Immunologic: Negative for immunocompromised state.  Neurological: Negative for light-headedness.  Hematological: Does not bruise/bleed easily.  Psychiatric/Behavioral:  Negative.       Objective:    Physical Exam  Constitutional: She is oriented to person, place, and time. She appears well-developed and well-nourished. No distress.  HENT:  Head: Normocephalic and atraumatic.  Right Ear: External ear normal.  Left Ear: External ear normal.  Eyes: Conjunctivae are normal. Right eye exhibits no discharge. Left eye exhibits no discharge. No scleral icterus.  Neck: No JVD present. No tracheal deviation present.  Cardiovascular: Normal rate, regular rhythm and normal heart sounds.  Pulmonary/Chest: Effort normal and breath sounds normal. No stridor. No respiratory distress. She has no wheezes. She has no rales.  Abdominal: Bowel sounds are normal.  Musculoskeletal:        General: No edema.  Neurological: She is alert and oriented to person, place, and time.  Skin: Skin is warm and dry. She is not diaphoretic.  Psychiatric: She has a normal mood and affect. Her behavior is normal.    BP 126/80   Pulse 73   Ht 5\' 4"  (1.626 m)   Wt 151 lb 6 oz (68.7 kg)   SpO2 97%   BMI 25.98 kg/m  Wt Readings from Last 3 Encounters:  09/04/18 151 lb 6 oz (68.7 kg)  02/23/18 155 lb (70.3 kg)  01/15/18 155 lb (70.3 kg)   BP Readings from Last 3 Encounters:  09/04/18 126/80  06/05/18 110/70  04/23/18 109/79   Guideline developer:  UpToDate (see UpToDate for funding source) Date Released: June 2014  Health Maintenance Due  Topic Date Due  . INFLUENZA VACCINE  08/25/2018    There are no preventive care reminders to display for this patient.  Lab Results  Component Value Date   TSH 2.97 09/04/2018   Lab Results  Component Value Date   WBC 9.2 09/04/2018   HGB 13.1 09/04/2018   HCT 39.4 09/04/2018   MCV 96.1 09/04/2018   PLT 430.0 (H) 09/04/2018   Lab Results  Component Value Date   NA 142 09/04/2018   K 3.2 (L) 09/04/2018   CO2 27 09/04/2018   GLUCOSE 100 (H) 09/04/2018   BUN 14 09/04/2018   CREATININE 0.88 09/04/2018   BILITOT 0.5  01/15/2018   ALKPHOS 63 01/15/2018   AST 14 01/15/2018   ALT 13 01/15/2018   PROT 7.0 01/15/2018   ALBUMIN 4.2 01/15/2018   CALCIUM 9.0 09/04/2018   ANIONGAP 10 09/25/2016   GFR 63.81 09/04/2018   Lab Results  Component Value Date   CHOL 213 (H) 10/19/2017   Lab Results  Component Value Date   HDL 75.50 10/19/2017   Lab Results  Component Value Date   LDLCALC 117 (H) 10/19/2017   Lab Results  Component Value Date   TRIG 98.0 10/19/2017   Lab Results  Component Value Date   CHOLHDL 3 10/19/2017   No results found for: HGBA1C    Assessment & Plan:   Problem List Items Addressed This Visit      Endocrine   Hypothyroidism   Relevant Orders   TSH (Completed)     Genitourinary   CKD (chronic kidney disease) stage 3, GFR 30-59 ml/min ([facility])   Relevant Orders   Basic metabolic panel (Completed)     Other   Hypokalemia   Relevant Medications   potassium chloride SA (K-DUR) 20 MEQ tablet   Other Relevant Orders   Basic metabolic panel (Completed)   Edema - Primary   Relevant Medications   furosemide (LASIX) 20 MG tablet   Other Relevant Orders   CBC (Completed)   Basic metabolic panel (Completed)      Meds ordered this encounter  Medications  . furosemide (LASIX) 20 MG tablet    Sig: Take 1 tablet (20 mg total) by mouth daily as needed.    Dispense:  30 tablet    Refill:  3  . potassium chloride SA (K-DUR) 20 MEQ tablet    Sig: Take one twice daily for 2 weeks and then  follow up for an office visit.    Dispense:  60 tablet    Refill:  0    Follow-up: Return in about 2 weeks (around 09/18/2018).

## 2018-09-05 LAB — BASIC METABOLIC PANEL
BUN: 14 mg/dL (ref 6–23)
CO2: 27 mEq/L (ref 19–32)
Calcium: 9 mg/dL (ref 8.4–10.5)
Chloride: 106 mEq/L (ref 96–112)
Creatinine, Ser: 0.88 mg/dL (ref 0.40–1.20)
GFR: 63.81 mL/min (ref >60.00)
Glucose, Bld: 100 mg/dL — ABNORMAL HIGH (ref 70–99)
Potassium: 3.2 mEq/L — ABNORMAL LOW (ref 3.5–5.1)
Sodium: 142 mEq/L (ref 135–145)

## 2018-09-05 LAB — CBC
HCT: 39.4 % (ref 36.0–46.0)
Hemoglobin: 13.1 g/dL (ref 12.0–15.0)
MCHC: 33.2 g/dL (ref 30.0–36.0)
MCV: 96.1 fl (ref 78.0–100.0)
Platelets: 430 10*3/uL — ABNORMAL HIGH (ref 150.0–400.0)
RBC: 4.1 Mil/uL (ref 3.87–5.11)
RDW: 13.6 % (ref 11.5–15.5)
WBC: 9.2 10*3/uL (ref 4.0–10.5)

## 2018-09-05 LAB — TSH: TSH: 2.97 u[IU]/mL (ref 0.35–4.50)

## 2018-09-06 ENCOUNTER — Other Ambulatory Visit: Payer: Self-pay | Admitting: Family Medicine

## 2018-09-06 DIAGNOSIS — E876 Hypokalemia: Secondary | ICD-10-CM

## 2018-09-06 MED ORDER — POTASSIUM CHLORIDE CRYS ER 20 MEQ PO TBCR: EXTENDED_RELEASE_TABLET | ORAL | 0 refills | Status: DC

## 2018-09-06 NOTE — Addendum Note (Signed)
Addended by: Jon Billings on: 09/06/2018 08:07 AM   Modules accepted: Orders

## 2018-09-21 ENCOUNTER — Encounter: Payer: Self-pay | Admitting: Family Medicine

## 2018-10-03 ENCOUNTER — Other Ambulatory Visit: Payer: Self-pay

## 2018-10-03 DIAGNOSIS — E039 Hypothyroidism, unspecified: Secondary | ICD-10-CM

## 2018-10-03 MED ORDER — ESTRADIOL 0.5 MG PO TABS: ORAL_TABLET | ORAL | 2 refills | Status: DC

## 2018-10-03 MED ORDER — LEVOTHYROXINE SODIUM 100 MCG PO TABS: ORAL_TABLET | ORAL | 2 refills | Status: DC

## 2018-10-04 ENCOUNTER — Telehealth: Payer: Self-pay

## 2018-10-04 NOTE — Telephone Encounter (Signed)

## 2018-10-05 ENCOUNTER — Encounter: Payer: Self-pay | Admitting: Family Medicine

## 2018-10-05 ENCOUNTER — Other Ambulatory Visit: Payer: Self-pay

## 2018-10-05 ENCOUNTER — Ambulatory Visit (INDEPENDENT_AMBULATORY_CARE_PROVIDER_SITE_OTHER): Payer: PPO | Admitting: Family Medicine

## 2018-10-05 VITALS — BP 110/72 | HR 102 | Ht 64.0 in | Wt 153.0 lb

## 2018-10-05 DIAGNOSIS — Z23 Encounter for immunization: Secondary | ICD-10-CM | POA: Insufficient documentation

## 2018-10-05 DIAGNOSIS — E876 Hypokalemia: Secondary | ICD-10-CM

## 2018-10-05 DIAGNOSIS — N183 Chronic kidney disease, stage 3 unspecified: Secondary | ICD-10-CM

## 2018-10-05 LAB — BASIC METABOLIC PANEL
BUN: 15 mg/dL (ref 6–23)
CO2: 29 mEq/L (ref 19–32)
Calcium: 9 mg/dL (ref 8.4–10.5)
Chloride: 107 mEq/L (ref 96–112)
Creatinine, Ser: 0.93 mg/dL (ref 0.40–1.20)
GFR: 59.86 mL/min — ABNORMAL LOW (ref >60.00)
Glucose, Bld: 140 mg/dL — ABNORMAL HIGH (ref 70–99)
Potassium: 4 mEq/L (ref 3.5–5.1)
Sodium: 143 mEq/L (ref 135–145)

## 2018-10-05 MED ORDER — POTASSIUM CHLORIDE CRYS ER 20 MEQ PO TBCR: EXTENDED_RELEASE_TABLET | ORAL | 1 refills | Status: DC

## 2018-10-05 NOTE — Progress Notes (Signed)
Established Patient Office Visit  Subjective:  Patient ID: Megan Valentine, female    DOB: 12/27/1950  Age: 68 y.o. MRN: [id number]  CC:  Chief Complaint  Patient presents with  . Follow-up    HPI Megan Valentine presents for follow-up of her hypokalemia.  She had taken potassium chloride 20 mEq twice daily for 2 weeks and then stopped.  She has been hydrating well.  Chronic kidney kidney disease has improved with her GFR moving back into the normal range at 63.  She is feeling much better after her surgery and has regained her bowel and bladder continence.  Energy level is much improved.  Blood pressure is back into the normal range.  Past Medical History:  Diagnosis Date  . Allergy   . Arthritis   . Cataract   . Chronic bronchitis ([place])   . Complication of anesthesia    hard to wake up  . Diverticulosis   . Environmental allergies   . GERD (gastroesophageal reflux disease)   . Hiatal hernia   . History of chicken pox   . Hyperlipidemia   . Hypothyroidism   . IBS (irritable bowel syndrome)   . Migraines   . Mitral valve prolapse   . Rectal prolapse     Past Surgical History:  Procedure Laterality Date  . ABDOMINAL HYSTERECTOMY  1999  . APPENDECTOMY  1962  . BREAST CYST ASPIRATION Bilateral   . EYE SURGERY    . JOINT REPLACEMENT     rt knee scope  . REFRACTIVE SURGERY  2000   both eyes  . TOTAL KNEE ARTHROPLASTY Right 03/24/2015   Procedure: TOTAL KNEE ARTHROPLASTY;  Surgeon: [name], MD;  Location: [facility];  Service: Orthopedics;  Laterality: Right;  . TUBAL LIGATION  1977    Family History  Problem Relation Age of Onset  . Stomach cancer Maternal Grandmother   . Hypertension Maternal Grandmother   . Diabetes Maternal Grandmother   . Stroke Maternal Grandmother   . Cancer Maternal Grandmother        Stomach cancer  . Colon cancer Paternal Grandfather   . Heart attack Paternal Grandfather   . Hyperlipidemia Mother   . Diabetes Mother   .  Hypertension Mother   . Alzheimer's disease Mother   . Diabetes Father   . Hyperlipidemia Father   . Heart disease Father   . Heart attack Father   . Stroke Paternal Grandmother     Social History   Socioeconomic History  . Marital status: Widowed    Spouse name: Not on file  . Number of children: Not on file  . Years of education: 85  . Highest education level: Not on file  Occupational History  . Occupation: PROOF READER    Employer: [employer]  . Financial resource strain: Not on file  . Food insecurity    Worry: Not on file    Inability: Not on file  . Transportation needs    Medical: Not on file    Non-medical: Not on file  Tobacco Use  . Smoking status: Former Smoker    Types: Cigarettes    Quit date: 03/23/1976    Years since quitting: 42.5  . Smokeless tobacco: Never Used  Substance and Sexual Activity  . Alcohol use: No  . Drug use: No  . Sexual activity: Yes    Birth control/protection: Abstinence  Lifestyle  . Physical activity    Days per week: Not on file  Minutes per session: Not on file  . Stress: Not on file  Relationships  . Social Herbalist on phone: Not on file    Gets together: Not on file    Attends religious service: Not on file    Active member of club or organization: Not on file    Attends meetings of clubs or organizations: Not on file    Relationship status: Not on file  . Intimate partner violence    Fear of current or ex partner: Not on file    Emotionally abused: Not on file    Physically abused: Not on file    Forced sexual activity: Not on file  Other Topics Concern  . Not on file  Social History Narrative   Caffeine Use-yes   Regular exercise-no          Outpatient Medications Prior to Visit  Medication Sig Dispense Refill  . albuterol (VENTOLIN HFA) 108 (90 Base) MCG/ACT inhaler INHALE 2 PUFFS INTO THE LUNGS EVERY 6 HOURS AS NEEDED FOR WHEEZING OR SHORTNESS OF BREATH 8.5 g 2  . aspirin 81 MG  chewable tablet Chew 81 mg daily by mouth.    . estradiol (ESTRACE) 0.5 MG tablet TAKE 1 TABLET(0.5 MG) BY MOUTH DAILY 90 tablet 2  . fluticasone (FLONASE) 50 MCG/ACT nasal spray Place 1 spray into both nostrils daily. 18.2 g 5  . furosemide (LASIX) 20 MG tablet Take 1 tablet (20 mg total) by mouth daily as needed. 30 tablet 3  . levothyroxine (SYNTHROID) 100 MCG tablet TAKE 1 TABLET BY MOUTH DAILY....NEED ANNUAL FOR REFILLS 90 tablet 2  . meclizine (ANTIVERT) 25 MG tablet Take 25 mg by mouth daily as needed for dizziness.     . meloxicam (MOBIC) 15 MG tablet TAKE 1 TABLET(15 MG) BY MOUTH DAILY FOR 2 WEEKS THEN AS NEEDED 90 tablet 0  . pantoprazole (PROTONIX) 20 MG tablet TAKE 1 TABLET(20 MG) BY MOUTH DAILY 90 tablet 1  . triamcinolone ointment (KENALOG) 0.1 % Apply 1 application topically 2 (two) times daily. To rash on arms. Avoid face and private areas. 80 g 1  . vitamin B-12 (CYANOCOBALAMIN) 100 MCG tablet Take 500 mcg by mouth daily.    . potassium chloride SA (K-DUR) 20 MEQ tablet Take one twice daily for 2 weeks and then follow up for an office visit. 60 tablet 0  . cholecalciferol (VITAMIN D) 1000 UNITS tablet Take 1,000 Units by mouth daily.     . Cyanocobalamin (VITAMIN B12 PO) Take 1,000 mg by mouth daily. 2500 mg daily     No facility-administered medications prior to visit.     Allergies  Allergen Reactions  . Tetracycline Hives and Itching    ROS Review of Systems  Constitutional: Negative.   HENT: Negative.   Eyes: Negative for photophobia and visual disturbance.  Cardiovascular: Negative.   Gastrointestinal: Negative.   Endocrine: Negative for polyphagia and polyuria.  Genitourinary: Negative for difficulty urinating, dysuria, frequency and urgency.  Musculoskeletal: Negative for gait problem and myalgias.  Skin: Negative for pallor.  Allergic/Immunologic: Negative for immunocompromised state.  Neurological: Negative for light-headedness and headaches.   Hematological: Does not bruise/bleed easily.  Psychiatric/Behavioral: Negative.       Objective:    Physical Exam  Constitutional: She is oriented to person, place, and time. She appears well-developed and well-nourished. No distress.  HENT:  Head: Normocephalic and atraumatic.  Right Ear: External ear normal.  Left Ear: External ear normal.  Mouth/Throat: Oropharynx  is clear and moist. No oropharyngeal exudate.  Eyes: Pupils are equal, round, and reactive to light. Conjunctivae are normal. Right eye exhibits no discharge. Left eye exhibits no discharge. No scleral icterus.  Neck: Neck supple. No JVD present. No tracheal deviation present. No thyromegaly present.  Cardiovascular: Normal rate, regular rhythm and normal heart sounds.  Pulmonary/Chest: Effort normal and breath sounds normal. No stridor.  Abdominal: Bowel sounds are normal.  Musculoskeletal:        General: No edema.  Lymphadenopathy:    She has no cervical adenopathy.  Neurological: She is alert and oriented to person, place, and time.  Skin: Skin is warm and dry. She is not diaphoretic.  Psychiatric: She has a normal mood and affect. Her behavior is normal.    BP 110/72   Pulse (!) 102   Ht 5\' 4"  (1.626 m)   Wt 153 lb (69.4 kg)   SpO2 99%   BMI 26.26 kg/m  Wt Readings from Last 3 Encounters:  10/05/18 153 lb (69.4 kg)  09/04/18 151 lb 6 oz (68.7 kg)  02/23/18 155 lb (70.3 kg)   BP Readings from Last 3 Encounters:  10/05/18 110/72  09/04/18 126/80  06/05/18 110/70   Guideline developer:  UpToDate (see UpToDate for funding source) Date Released: June 2014  Health Maintenance Due  Topic Date Due  . INFLUENZA VACCINE  08/25/2018    There are no preventive care reminders to display for this patient.  Lab Results  Component Value Date   TSH 2.97 09/04/2018   Lab Results  Component Value Date   WBC 9.2 09/04/2018   HGB 13.1 09/04/2018   HCT 39.4 09/04/2018   MCV 96.1 09/04/2018   PLT 430.0 (H)  09/04/2018   Lab Results  Component Value Date   NA 142 09/04/2018   K 3.2 (L) 09/04/2018   CO2 27 09/04/2018   GLUCOSE 100 (H) 09/04/2018   BUN 14 09/04/2018   CREATININE 0.88 09/04/2018   BILITOT 0.5 01/15/2018   ALKPHOS 63 01/15/2018   AST 14 01/15/2018   ALT 13 01/15/2018   PROT 7.0 01/15/2018   ALBUMIN 4.2 01/15/2018   CALCIUM 9.0 09/04/2018   ANIONGAP 10 09/25/2016   GFR 63.81 09/04/2018   Lab Results  Component Value Date   CHOL 213 (H) 10/19/2017   Lab Results  Component Value Date   HDL 75.50 10/19/2017   Lab Results  Component Value Date   LDLCALC 117 (H) 10/19/2017   Lab Results  Component Value Date   TRIG 98.0 10/19/2017   Lab Results  Component Value Date   CHOLHDL 3 10/19/2017   No results found for: HGBA1C    Assessment & Plan:   Problem List Items Addressed This Visit      Genitourinary   CKD (chronic kidney disease) stage 3, GFR 30-59 ml/min (HCC)     Other   Hypokalemia   Relevant Medications   potassium chloride SA (K-DUR) 20 MEQ tablet   Other Relevant Orders   Basic metabolic panel   Need for influenza vaccination - Primary   Relevant Orders   Flu Vaccine QUAD High Dose(Fluad) (Completed)      Meds ordered this encounter  Medications  . potassium chloride SA (K-DUR) 20 MEQ tablet    Sig: Take one daily with lasix.    Dispense:  90 tablet    Refill:  1    Follow-up: Return 3 months or sooner pending lab results..   Reminded patient that she  will need to take potassium with her Lasix.

## 2018-10-09 DIAGNOSIS — N390 Urinary tract infection, site not specified: Secondary | ICD-10-CM | POA: Diagnosis not present

## 2018-10-09 DIAGNOSIS — Z09 Encounter for follow-up examination after completed treatment for conditions other than malignant neoplasm: Secondary | ICD-10-CM | POA: Diagnosis not present

## 2018-10-09 DIAGNOSIS — N393 Stress incontinence (female) (male): Secondary | ICD-10-CM | POA: Diagnosis not present

## 2018-10-09 DIAGNOSIS — R338 Other retention of urine: Secondary | ICD-10-CM | POA: Diagnosis not present

## 2018-11-01 ENCOUNTER — Other Ambulatory Visit: Payer: Self-pay

## 2018-11-01 DIAGNOSIS — Z20822 Contact with and (suspected) exposure to covid-19: Secondary | ICD-10-CM

## 2018-11-01 DIAGNOSIS — Z20828 Contact with and (suspected) exposure to other viral communicable diseases: Secondary | ICD-10-CM | POA: Diagnosis not present

## 2018-11-02 ENCOUNTER — Encounter: Payer: Self-pay | Admitting: Family Medicine

## 2018-11-02 LAB — NOVEL CORONAVIRUS, NAA: SARS-CoV-2, NAA: DETECTED — AB

## 2018-11-06 ENCOUNTER — Encounter: Payer: Self-pay | Admitting: Family Medicine

## 2018-11-09 ENCOUNTER — Other Ambulatory Visit: Payer: Self-pay

## 2018-11-09 DIAGNOSIS — J301 Allergic rhinitis due to pollen: Secondary | ICD-10-CM

## 2018-11-09 MED ORDER — ALBUTEROL SULFATE HFA 108 (90 BASE) MCG/ACT IN AERS: INHALATION_SPRAY | RESPIRATORY_TRACT | 2 refills | Status: DC

## 2018-11-11 ENCOUNTER — Encounter: Payer: Self-pay | Admitting: Family Medicine

## 2018-11-12 ENCOUNTER — Other Ambulatory Visit: Payer: Self-pay

## 2018-11-12 ENCOUNTER — Encounter: Payer: Self-pay | Admitting: Family Medicine

## 2018-11-12 ENCOUNTER — Ambulatory Visit (INDEPENDENT_AMBULATORY_CARE_PROVIDER_SITE_OTHER): Payer: PPO | Admitting: Family Medicine

## 2018-11-12 VITALS — Ht 64.0 in

## 2018-11-12 DIAGNOSIS — Z8619 Personal history of other infectious and parasitic diseases: Secondary | ICD-10-CM | POA: Insufficient documentation

## 2018-11-12 DIAGNOSIS — J3489 Other specified disorders of nose and nasal sinuses: Secondary | ICD-10-CM | POA: Insufficient documentation

## 2018-11-12 DIAGNOSIS — Z8616 Personal history of COVID-19: Secondary | ICD-10-CM

## 2018-11-12 MED ORDER — PREDNISONE 10 MG PO TABS: 10.0000 mg | ORAL_TABLET | Freq: Two times a day (BID) | ORAL | 0 refills | Status: AC

## 2018-11-12 NOTE — Progress Notes (Signed)
Established Patient Office Visit  Subjective:  Patient ID: Megan Valentine, female    DOB: 07/27/1950  Age: 68 y.o. MRN: [id number]  CC:  Chief Complaint  Patient presents with  . Follow-up    HPI [address] presents for follow-up of her recent Covid infection.  Patient is feeling much more like herself and her energy level has returned.  Denies any significant cough shortness of breath or difficulty breathing.  She has no myalgias or arthralgias.  She is feeling some pressure in her right side of her face.  At this point there is minimal rhinorrhea or drainage.  Denies any fevers chills nausea or vomiting.  She has been continuing to use her Flonase.  Past Medical History:  Diagnosis Date  . Allergy   . Arthritis   . Cataract   . Chronic bronchitis ([place])   . Complication of anesthesia    hard to wake up  . Diverticulosis   . Environmental allergies   . GERD (gastroesophageal reflux disease)   . Hiatal hernia   . History of chicken pox   . Hyperlipidemia   . Hypothyroidism   . IBS (irritable bowel syndrome)   . Migraines   . Mitral valve prolapse   . Rectal prolapse     Past Surgical History:  Procedure Laterality Date  . ABDOMINAL HYSTERECTOMY  1999  . APPENDECTOMY  1962  . BREAST CYST ASPIRATION Bilateral   . EYE SURGERY    . JOINT REPLACEMENT     rt knee scope  . REFRACTIVE SURGERY  2000   both eyes  . TOTAL KNEE ARTHROPLASTY Right 03/24/2015   Procedure: TOTAL KNEE ARTHROPLASTY;  Surgeon: [name], MD;  Location: [facility];  Service: Orthopedics;  Laterality: Right;  . TUBAL LIGATION  1977    Family History  Problem Relation Age of Onset  . Stomach cancer Maternal Grandmother   . Hypertension Maternal Grandmother   . Diabetes Maternal Grandmother   . Stroke Maternal Grandmother   . Cancer Maternal Grandmother        Stomach cancer  . Colon cancer Paternal Grandfather   . Heart attack Paternal Grandfather   . Hyperlipidemia Mother   . Diabetes  Mother   . Hypertension Mother   . Alzheimer's disease Mother   . Diabetes Father   . Hyperlipidemia Father   . Heart disease Father   . Heart attack Father   . Stroke Paternal Grandmother     Social History   Socioeconomic History  . Marital status: Widowed    Spouse name: Not on file  . Number of children: Not on file  . Years of education: 75  . Highest education level: Not on file  Occupational History  . Occupation: PROOF READER    Employer: [employer]  . Financial resource strain: Not on file  . Food insecurity    Worry: Not on file    Inability: Not on file  . Transportation needs    Medical: Not on file    Non-medical: Not on file  Tobacco Use  . Smoking status: Former Smoker    Types: Cigarettes    Quit date: 03/23/1976    Years since quitting: 42.6  . Smokeless tobacco: Never Used  Substance and Sexual Activity  . Alcohol use: No  . Drug use: No  . Sexual activity: Yes    Birth control/protection: Abstinence  Lifestyle  . Physical activity    Days per week: Not  on file    Minutes per session: Not on file  . Stress: Not on file  Relationships  . Social Herbalist on phone: Not on file    Gets together: Not on file    Attends religious service: Not on file    Active member of club or organization: Not on file    Attends meetings of clubs or organizations: Not on file    Relationship status: Not on file  . Intimate partner violence    Fear of current or ex partner: Not on file    Emotionally abused: Not on file    Physically abused: Not on file    Forced sexual activity: Not on file  Other Topics Concern  . Not on file  Social History Narrative   Caffeine Use-yes   Regular exercise-no          Outpatient Medications Prior to Visit  Medication Sig Dispense Refill  . albuterol (VENTOLIN HFA) 108 (90 Base) MCG/ACT inhaler INHALE 2 PUFFS INTO THE LUNGS EVERY 6 HOURS AS NEEDED FOR WHEEZING OR SHORTNESS OF BREATH 8.5 g 2  .  aspirin 81 MG chewable tablet Chew 81 mg daily by mouth.    . estradiol (ESTRACE) 0.5 MG tablet TAKE 1 TABLET(0.5 MG) BY MOUTH DAILY 90 tablet 2  . fluticasone (FLONASE) 50 MCG/ACT nasal spray Place 1 spray into both nostrils daily. 18.2 g 5  . furosemide (LASIX) 20 MG tablet Take 1 tablet (20 mg total) by mouth daily as needed. 30 tablet 3  . levothyroxine (SYNTHROID) 100 MCG tablet TAKE 1 TABLET BY MOUTH DAILY....NEED ANNUAL FOR REFILLS 90 tablet 2  . meclizine (ANTIVERT) 25 MG tablet Take 25 mg by mouth daily as needed for dizziness.     . meloxicam (MOBIC) 15 MG tablet TAKE 1 TABLET(15 MG) BY MOUTH DAILY FOR 2 WEEKS THEN AS NEEDED 90 tablet 0  . pantoprazole (PROTONIX) 20 MG tablet TAKE 1 TABLET(20 MG) BY MOUTH DAILY 90 tablet 1  . potassium chloride SA (K-DUR) 20 MEQ tablet Take one daily with lasix. 90 tablet 1  . triamcinolone ointment (KENALOG) 0.1 % Apply 1 application topically 2 (two) times daily. To rash on arms. Avoid face and private areas. 80 g 1  . vitamin B-12 (CYANOCOBALAMIN) 100 MCG tablet Take 500 mcg by mouth daily.    . cholecalciferol (VITAMIN D) 1000 UNITS tablet Take 1,000 Units by mouth daily.      No facility-administered medications prior to visit.     Allergies  Allergen Reactions  . Tetracycline Hives and Itching    ROS Review of Systems  Constitutional: Negative for chills, diaphoresis, fatigue, fever and unexpected weight change.  HENT: Positive for congestion, postnasal drip, rhinorrhea, sinus pressure and sinus pain.   Eyes: Negative for photophobia and visual disturbance.  Respiratory: Negative for cough, chest tightness, shortness of breath and wheezing.   Cardiovascular: Negative.   Gastrointestinal: Negative.   Musculoskeletal: Negative for arthralgias and myalgias.  Skin: Negative for rash.      Objective:    Physical Exam  Constitutional: She is oriented to person, place, and time. She appears well-developed and well-nourished. No distress.   HENT:  Head: Normocephalic and atraumatic.  Right Ear: External ear normal.  Left Ear: External ear normal.  Eyes: Conjunctivae are normal. Right eye exhibits no discharge. Left eye exhibits no discharge. No scleral icterus.  Neck: No JVD present. No tracheal deviation present.  Pulmonary/Chest: Effort normal. No stridor.  Neurological:  She is alert and oriented to person, place, and time.  Skin: Skin is warm and dry. She is not diaphoretic.  Psychiatric: She has a normal mood and affect. Her behavior is normal.    Ht 5\' 4"  (1.626 m)   BMI 26.26 kg/m  Wt Readings from Last 3 Encounters:  10/05/18 153 lb (69.4 kg)  09/04/18 151 lb 6 oz (68.7 kg)  02/23/18 155 lb (70.3 kg)   BP Readings from Last 3 Encounters:  10/05/18 110/72  09/04/18 126/80  06/05/18 110/70   Guideline developer:  UpToDate (see UpToDate for funding source) Date Released: June 2014  There are no preventive care reminders to display for this patient.  There are no preventive care reminders to display for this patient.  Lab Results  Component Value Date   TSH 2.97 09/04/2018   Lab Results  Component Value Date   WBC 9.2 09/04/2018   HGB 13.1 09/04/2018   HCT 39.4 09/04/2018   MCV 96.1 09/04/2018   PLT 430.0 (H) 09/04/2018   Lab Results  Component Value Date   NA 143 10/05/2018   K 4.0 10/05/2018   CO2 29 10/05/2018   GLUCOSE 140 (H) 10/05/2018   BUN 15 10/05/2018   CREATININE 0.93 10/05/2018   BILITOT 0.5 01/15/2018   ALKPHOS 63 01/15/2018   AST 14 01/15/2018   ALT 13 01/15/2018   PROT 7.0 01/15/2018   ALBUMIN 4.2 01/15/2018   CALCIUM 9.0 10/05/2018   ANIONGAP 10 09/25/2016   GFR 59.86 (L) 10/05/2018   Lab Results  Component Value Date   CHOL 213 (H) 10/19/2017   Lab Results  Component Value Date   HDL 75.50 10/19/2017   Lab Results  Component Value Date   LDLCALC 117 (H) 10/19/2017   Lab Results  Component Value Date   TRIG 98.0 10/19/2017   Lab Results  Component Value  Date   CHOLHDL 3 10/19/2017   No results found for: HGBA1C    Assessment & Plan:   Problem List Items Addressed This Visit      Other   Sinus pressure   History of 2019 novel coronavirus disease (COVID-19) - Primary      Meds ordered this encounter  Medications  . predniSONE (DELTASONE) 10 MG tablet    Sig: Take 1 tablet (10 mg total) by mouth 2 (two) times daily with a meal for 8 days.    Dispense:  16 tablet    Refill:  0    Follow-up: Return in about 1 week (around 11/19/2018), or if symptoms worsen or fail to improve.   Patient will have music next to help with drainage.  Follow-up in 1 week if not improving.  Virtual Visit via Video Note  I connected with Xolani B Millikan on 11/12/18 at 11:30 AM EDT by a video enabled telemedicine application and verified that I am speaking with the correct person using two identifiers.  Location: Patient: home Provider:    I discussed the limitations of evaluation and management by telemedicine and the availability of in person appointments. The patient expressed understanding and agreed to proceed.  History of Present Illness:    Observations/Objective:   Assessment and Plan:   Follow Up Instructions:    I discussed the assessment and treatment plan with the patient. The patient was provided an opportunity to ask questions and all were answered. The patient agreed with the plan and demonstrated an understanding of the instructions.   The patient was advised to call back  or seek an in-person evaluation if the symptoms worsen or if the condition fails to improve as anticipated.  I provided 23 minutes of non-face-to-face time during this encounter.   Libby Maw, MD

## 2018-11-21 DIAGNOSIS — N183 Chronic kidney disease, stage 3 unspecified: Secondary | ICD-10-CM | POA: Diagnosis not present

## 2018-11-21 DIAGNOSIS — N390 Urinary tract infection, site not specified: Secondary | ICD-10-CM | POA: Diagnosis not present

## 2018-11-21 DIAGNOSIS — N1831 Chronic kidney disease, stage 3a: Secondary | ICD-10-CM | POA: Diagnosis not present

## 2018-11-21 DIAGNOSIS — I129 Hypertensive chronic kidney disease with stage 1 through stage 4 chronic kidney disease, or unspecified chronic kidney disease: Secondary | ICD-10-CM | POA: Diagnosis not present

## 2018-11-21 DIAGNOSIS — E039 Hypothyroidism, unspecified: Secondary | ICD-10-CM | POA: Diagnosis not present

## 2018-11-29 DIAGNOSIS — D1801 Hemangioma of skin and subcutaneous tissue: Secondary | ICD-10-CM | POA: Diagnosis not present

## 2018-11-29 DIAGNOSIS — L821 Other seborrheic keratosis: Secondary | ICD-10-CM | POA: Diagnosis not present

## 2018-11-29 DIAGNOSIS — D225 Melanocytic nevi of trunk: Secondary | ICD-10-CM | POA: Diagnosis not present

## 2018-11-29 DIAGNOSIS — L812 Freckles: Secondary | ICD-10-CM | POA: Diagnosis not present

## 2018-12-01 ENCOUNTER — Encounter: Payer: Self-pay | Admitting: Family Medicine

## 2018-12-01 DIAGNOSIS — R609 Edema, unspecified: Secondary | ICD-10-CM

## 2018-12-03 MED ORDER — FUROSEMIDE 20 MG PO TABS: 20.0000 mg | ORAL_TABLET | Freq: Every day | ORAL | 1 refills | Status: DC | PRN

## 2018-12-14 ENCOUNTER — Emergency Department (HOSPITAL_COMMUNITY): Payer: PPO

## 2018-12-14 ENCOUNTER — Inpatient Hospital Stay (HOSPITAL_COMMUNITY)
Admission: EM | Admit: 2018-12-14 | Discharge: 2018-12-18 | DRG: 419 | Disposition: A | Discharge status: Home / Self Care | Payer: PPO | Attending: Internal Medicine | Admitting: Internal Medicine

## 2018-12-14 ENCOUNTER — Encounter (HOSPITAL_COMMUNITY): Payer: Self-pay | Admitting: Emergency Medicine

## 2018-12-14 ENCOUNTER — Other Ambulatory Visit: Payer: Self-pay

## 2018-12-14 DIAGNOSIS — Z79890 Hormone replacement therapy (postmenopausal): Secondary | ICD-10-CM

## 2018-12-14 DIAGNOSIS — Z8619 Personal history of other infectious and parasitic diseases: Secondary | ICD-10-CM | POA: Diagnosis not present

## 2018-12-14 DIAGNOSIS — I10 Essential (primary) hypertension: Secondary | ICD-10-CM | POA: Diagnosis present

## 2018-12-14 DIAGNOSIS — E782 Mixed hyperlipidemia: Secondary | ICD-10-CM | POA: Diagnosis present

## 2018-12-14 DIAGNOSIS — N1831 Chronic kidney disease, stage 3a: Secondary | ICD-10-CM | POA: Diagnosis present

## 2018-12-14 DIAGNOSIS — K5730 Diverticulosis of large intestine without perforation or abscess without bleeding: Secondary | ICD-10-CM | POA: Diagnosis not present

## 2018-12-14 DIAGNOSIS — Z8249 Family history of ischemic heart disease and other diseases of the circulatory system: Secondary | ICD-10-CM | POA: Diagnosis not present

## 2018-12-14 DIAGNOSIS — Z8349 Family history of other endocrine, nutritional and metabolic diseases: Secondary | ICD-10-CM

## 2018-12-14 DIAGNOSIS — R109 Unspecified abdominal pain: Secondary | ICD-10-CM | POA: Diagnosis not present

## 2018-12-14 DIAGNOSIS — Z96651 Presence of right artificial knee joint: Secondary | ICD-10-CM | POA: Diagnosis present

## 2018-12-14 DIAGNOSIS — Z7982 Long term (current) use of aspirin: Secondary | ICD-10-CM

## 2018-12-14 DIAGNOSIS — Z800 Family history of malignant neoplasm of digestive organs: Secondary | ICD-10-CM | POA: Diagnosis not present

## 2018-12-14 DIAGNOSIS — R739 Hyperglycemia, unspecified: Secondary | ICD-10-CM | POA: Diagnosis present

## 2018-12-14 DIAGNOSIS — Z03818 Encounter for observation for suspected exposure to other biological agents ruled out: Secondary | ICD-10-CM | POA: Diagnosis not present

## 2018-12-14 DIAGNOSIS — K819 Cholecystitis, unspecified: Secondary | ICD-10-CM | POA: Diagnosis not present

## 2018-12-14 DIAGNOSIS — E876 Hypokalemia: Secondary | ICD-10-CM | POA: Diagnosis present

## 2018-12-14 DIAGNOSIS — K7689 Other specified diseases of liver: Secondary | ICD-10-CM | POA: Diagnosis not present

## 2018-12-14 DIAGNOSIS — Z881 Allergy status to other antibiotic agents status: Secondary | ICD-10-CM | POA: Diagnosis not present

## 2018-12-14 DIAGNOSIS — K219 Gastro-esophageal reflux disease without esophagitis: Secondary | ICD-10-CM | POA: Diagnosis present

## 2018-12-14 DIAGNOSIS — Z833 Family history of diabetes mellitus: Secondary | ICD-10-CM

## 2018-12-14 DIAGNOSIS — Z820 Family history of epilepsy and other diseases of the nervous system: Secondary | ICD-10-CM | POA: Diagnosis not present

## 2018-12-14 DIAGNOSIS — R112 Nausea with vomiting, unspecified: Secondary | ICD-10-CM | POA: Diagnosis not present

## 2018-12-14 DIAGNOSIS — Z87891 Personal history of nicotine dependence: Secondary | ICD-10-CM

## 2018-12-14 DIAGNOSIS — K581 Irritable bowel syndrome with constipation: Secondary | ICD-10-CM | POA: Diagnosis present

## 2018-12-14 DIAGNOSIS — R1111 Vomiting without nausea: Secondary | ICD-10-CM | POA: Diagnosis not present

## 2018-12-14 DIAGNOSIS — K8020 Calculus of gallbladder without cholecystitis without obstruction: Secondary | ICD-10-CM | POA: Diagnosis not present

## 2018-12-14 DIAGNOSIS — R1084 Generalized abdominal pain: Secondary | ICD-10-CM | POA: Diagnosis not present

## 2018-12-14 DIAGNOSIS — I129 Hypertensive chronic kidney disease with stage 1 through stage 4 chronic kidney disease, or unspecified chronic kidney disease: Secondary | ICD-10-CM | POA: Diagnosis present

## 2018-12-14 DIAGNOSIS — Z823 Family history of stroke: Secondary | ICD-10-CM

## 2018-12-14 DIAGNOSIS — Z885 Allergy status to narcotic agent status: Secondary | ICD-10-CM

## 2018-12-14 DIAGNOSIS — R52 Pain, unspecified: Secondary | ICD-10-CM | POA: Diagnosis not present

## 2018-12-14 DIAGNOSIS — E785 Hyperlipidemia, unspecified: Secondary | ICD-10-CM | POA: Diagnosis present

## 2018-12-14 DIAGNOSIS — R1011 Right upper quadrant pain: Secondary | ICD-10-CM | POA: Diagnosis present

## 2018-12-14 DIAGNOSIS — K8000 Calculus of gallbladder with acute cholecystitis without obstruction: Secondary | ICD-10-CM | POA: Diagnosis present

## 2018-12-14 DIAGNOSIS — K810 Acute cholecystitis: Secondary | ICD-10-CM | POA: Diagnosis not present

## 2018-12-14 DIAGNOSIS — E039 Hypothyroidism, unspecified: Secondary | ICD-10-CM | POA: Diagnosis present

## 2018-12-14 DIAGNOSIS — R1013 Epigastric pain: Secondary | ICD-10-CM | POA: Diagnosis not present

## 2018-12-14 DIAGNOSIS — N183 Chronic kidney disease, stage 3 unspecified: Secondary | ICD-10-CM | POA: Diagnosis not present

## 2018-12-14 LAB — CBC WITH DIFFERENTIAL/PLATELET
Abs Immature Granulocytes: 0.02 10*3/uL (ref 0.00–0.07)
Basophils Absolute: 0.1 10*3/uL (ref 0.0–0.1)
Basophils Relative: 1 %
Eosinophils Absolute: 0.1 10*3/uL (ref 0.0–0.5)
Eosinophils Relative: 1 %
HCT: 42 % (ref 36.0–46.0)
Hemoglobin: 14 g/dL (ref 12.0–15.0)
Immature Granulocytes: 0 %
Lymphocytes Relative: 16 %
Lymphs Abs: 1.5 10*3/uL (ref 0.7–4.0)
MCH: 31.5 pg (ref 26.0–34.0)
MCHC: 33.3 g/dL (ref 30.0–36.0)
MCV: 94.6 fL (ref 80.0–100.0)
Monocytes Absolute: 0.5 10*3/uL (ref 0.1–1.0)
Monocytes Relative: 6 %
Neutro Abs: 6.9 10*3/uL (ref 1.7–7.7)
Neutrophils Relative %: 76 %
Platelets: 354 10*3/uL (ref 150–400)
RBC: 4.44 MIL/uL (ref 3.87–5.11)
RDW: 13.4 % (ref 11.5–15.5)
WBC: 9 10*3/uL (ref 4.0–10.5)
nRBC: 0 % (ref 0.0–0.2)

## 2018-12-14 LAB — COMPREHENSIVE METABOLIC PANEL
ALT: 15 U/L (ref 0–44)
AST: 16 U/L (ref 15–41)
Albumin: 4.1 g/dL (ref 3.5–5.0)
Alkaline Phosphatase: 63 U/L (ref 38–126)
Anion gap: 9 (ref 5–15)
BUN: 19 mg/dL (ref 8–23)
CO2: 22 mmol/L (ref 22–32)
Calcium: 9.5 mg/dL (ref 8.9–10.3)
Chloride: 110 mmol/L (ref 98–111)
Creatinine, Ser: 1.08 mg/dL — ABNORMAL HIGH (ref 0.44–1.00)
GFR calc Af Amer: >60 mL/min (ref >60)
GFR calc non Af Amer: 53 mL/min — ABNORMAL LOW (ref >60)
Glucose, Bld: 155 mg/dL — ABNORMAL HIGH (ref 70–99)
Potassium: 3.5 mmol/L (ref 3.5–5.1)
Sodium: 141 mmol/L (ref 135–145)
Total Bilirubin: 0.5 mg/dL (ref 0.3–1.2)
Total Protein: 7.1 g/dL (ref 6.5–8.1)

## 2018-12-14 LAB — HIV ANTIBODY (ROUTINE TESTING W REFLEX): HIV Screen 4th Generation wRfx: NONREACTIVE

## 2018-12-14 LAB — LIPASE, BLOOD: Lipase: 22 U/L (ref 11–51)

## 2018-12-14 MED ORDER — LACTATED RINGERS IV SOLN
INTRAVENOUS | Status: DC
  Administered 2018-12-14: 1000 mL via INTRAVENOUS
  Administered 2018-12-14 – 2018-12-15 (×2): via INTRAVENOUS

## 2018-12-14 MED ORDER — ACETAMINOPHEN 650 MG RE SUPP: 650.0000 mg | Freq: Four times a day (QID) | RECTAL | Status: DC | PRN

## 2018-12-14 MED ORDER — MORPHINE SULFATE (PF) 4 MG/ML IV SOLN
4.0000 mg | Freq: Once | INTRAVENOUS | Status: AC
  Administered 2018-12-14: 4 mg via INTRAVENOUS
  Filled 2018-12-14: qty 1

## 2018-12-14 MED ORDER — PROMETHAZINE HCL 25 MG/ML IJ SOLN
12.5000 mg | Freq: Once | INTRAMUSCULAR | Status: AC
  Administered 2018-12-14: 12.5 mg via INTRAVENOUS
  Filled 2018-12-14: qty 1

## 2018-12-14 MED ORDER — ONDANSETRON HCL 4 MG/2ML IJ SOLN
4.0000 mg | Freq: Once | INTRAMUSCULAR | Status: AC
  Administered 2018-12-14: 4 mg via INTRAVENOUS
  Filled 2018-12-14: qty 2

## 2018-12-14 MED ORDER — ASPIRIN EC 81 MG PO TBEC
81.0000 mg | DELAYED_RELEASE_TABLET | Freq: Every day | ORAL | Status: DC
  Administered 2018-12-14 – 2018-12-18 (×5): 81 mg via ORAL
  Filled 2018-12-14 (×5): qty 1

## 2018-12-14 MED ORDER — FLUTICASONE PROPIONATE 50 MCG/ACT NA SUSP
1.0000 | Freq: Every day | NASAL | Status: DC
  Filled 2018-12-14 (×2): qty 16

## 2018-12-14 MED ORDER — ONDANSETRON HCL 4 MG PO TABS: 4.0000 mg | ORAL_TABLET | Freq: Four times a day (QID) | ORAL | Status: DC | PRN

## 2018-12-14 MED ORDER — IOHEXOL 300 MG/ML  SOLN
100.0000 mL | Freq: Once | INTRAMUSCULAR | Status: AC | PRN
  Administered 2018-12-14: 100 mL via INTRAVENOUS

## 2018-12-14 MED ORDER — PANTOPRAZOLE SODIUM 40 MG IV SOLR
40.0000 mg | Freq: Two times a day (BID) | INTRAVENOUS | Status: DC
  Administered 2018-12-14 – 2018-12-18 (×8): 40 mg via INTRAVENOUS
  Filled 2018-12-14 (×8): qty 40

## 2018-12-14 MED ORDER — ACETAMINOPHEN 325 MG PO TABS
650.0000 mg | ORAL_TABLET | Freq: Four times a day (QID) | ORAL | Status: DC | PRN
  Administered 2018-12-15: 650 mg via ORAL
  Filled 2018-12-14: qty 2

## 2018-12-14 MED ORDER — SODIUM CHLORIDE 0.9 % IV BOLUS
1000.0000 mL | Freq: Once | INTRAVENOUS | Status: AC
  Administered 2018-12-14: 04:00:00 1000 mL via INTRAVENOUS

## 2018-12-14 MED ORDER — ENOXAPARIN SODIUM 40 MG/0.4ML ~~LOC~~ SOLN
40.0000 mg | Freq: Every day | SUBCUTANEOUS | Status: DC
  Administered 2018-12-14 – 2018-12-16 (×3): 40 mg via SUBCUTANEOUS
  Filled 2018-12-14 (×4): qty 0.4

## 2018-12-14 MED ORDER — LEVOTHYROXINE SODIUM 100 MCG PO TABS
100.0000 ug | ORAL_TABLET | Freq: Every day | ORAL | Status: DC
  Administered 2018-12-15 – 2018-12-18 (×4): 100 ug via ORAL
  Filled 2018-12-14 (×4): qty 1

## 2018-12-14 MED ORDER — ONDANSETRON HCL 4 MG/2ML IJ SOLN
4.0000 mg | Freq: Four times a day (QID) | INTRAMUSCULAR | Status: DC | PRN
  Administered 2018-12-14 – 2018-12-18 (×7): 4 mg via INTRAVENOUS
  Filled 2018-12-14 (×7): qty 2

## 2018-12-14 MED ORDER — ESTRADIOL 1 MG PO TABS
0.5000 mg | ORAL_TABLET | Freq: Every day | ORAL | Status: DC
  Administered 2018-12-15 – 2018-12-18 (×4): 0.5 mg via ORAL
  Filled 2018-12-14 (×6): qty 0.5

## 2018-12-14 MED ORDER — MORPHINE SULFATE (PF) 2 MG/ML IV SOLN
2.0000 mg | INTRAVENOUS | Status: DC | PRN
  Administered 2018-12-14 (×3): 2 mg via INTRAVENOUS
  Administered 2018-12-14 – 2018-12-15 (×3): 1 mg via INTRAVENOUS
  Filled 2018-12-14 (×7): qty 1

## 2018-12-14 NOTE — ED Notes (Signed)
ED TO INPATIENT HANDOFF REPORT  ED Nurse Name and Phone #:   S Name/Age/Gender Megan Valentine 68 y.o. female Room/Bed: 014C/014C  Code Status   Code Status: Full Code  Home/SNF/Other Home Patient oriented to: self, place, time and situation Is this baseline? Yes   Triage Complete: Triage complete  Chief Complaint abd pain  Triage Note  Patient BIB EMS for abdominal pain.  Patient states she ate chili beans at 1700pm and had a glass of grapefruit juice at 1900.  Patient woke up around 2300 with nausea and vomiting.  Patient had a few episodes of vomiting and called EMS around 0300.  Patient A&O x4.  One episode of emesis on arrival.  Patient states pain is R upper epigastric area and is sharp.  No hx of gallbladder issues.    Boyfriend ate same food as patient.   Allergies Allergies  Allergen Reactions  . Tetracycline Hives and Itching  . Dilaudid [Hydromorphone Hcl] Nausea And Vomiting    Level of Care/Admitting Diagnosis ED Disposition    ED Disposition Condition [facility]: [place] [[id number]]  Level of Care: Med-Surg [16]  I expect the patient will be discharged within 24 hours: Yes  LOW acuity---Tx typically complete <24 hrs---ACUTE conditions typically can be evaluated <24 hours---LABS likely to return to acceptable levels <24 hours---IS near functional baseline---EXPECTED to return to current living arrangement---NOT newly hypoxic: Meets criteria for 5C-Observation unit  Covid Evaluation: Asymptomatic Screening Protocol (No Symptoms)  Diagnosis: Abdominal pain [id number]  Admitting Physician: Karmen Bongo [[id number]]  Attending Physician: Karmen Bongo [[id number]]  PT Class (Do Not Modify): Observation [104]  PT Acc Code (Do Not Modify): Observation [10022]       B Medical/Surgery History Past Medical History:  Diagnosis Date  . Allergy   . Arthritis   . Cataract   . Chronic bronchitis ([place])   . Complication of  anesthesia    hard to wake up  . Diverticulosis   . Environmental allergies   . GERD (gastroesophageal reflux disease)   . Hiatal hernia   . History of chicken pox   . Hyperlipidemia   . Hypothyroidism   . IBS (irritable bowel syndrome)   . Migraines   . Mitral valve prolapse   . Rectal prolapse    Past Surgical History:  Procedure Laterality Date  . ABDOMINAL HYSTERECTOMY  1999  . APPENDECTOMY  1962  . BREAST CYST ASPIRATION Bilateral   . EYE SURGERY    . JOINT REPLACEMENT     rt knee scope  . REFRACTIVE SURGERY  2000   both eyes  . TOTAL KNEE ARTHROPLASTY Right 03/24/2015   Procedure: TOTAL KNEE ARTHROPLASTY;  Surgeon: [name], MD;  Location: [facility];  Service: Orthopedics;  Laterality: Right;  . TUBAL LIGATION  1977     A IV Location/Drains/Wounds Patient Lines/Drains/Airways Status   Active Line/Drains/Airways    Name:   Placement date:   Placement time:   Site:   Days:   Peripheral IV 12/14/18 Left Antecubital   12/14/18    0338    Antecubital   less than 1   Incision (Closed) 03/24/15 Knee Right   03/24/15    0927     1361          Intake/Output Last 24 hours No intake or output data in the 24 hours ending 12/14/18 1152  Labs/Imaging Results for orders placed or performed during the hospital encounter of 12/14/18 (  from the past 48 hour(s))  Comprehensive metabolic panel     Status: Abnormal   Collection Time: 12/14/18  3:34 AM  Result Value Ref Range   Sodium 141 135 - 145 mmol/L   Potassium 3.5 3.5 - 5.1 mmol/L   Chloride 110 98 - 111 mmol/L   CO2 22 22 - 32 mmol/L   Glucose, Bld 155 (H) 70 - 99 mg/dL   BUN 19 8 - 23 mg/dL   Creatinine, Ser 1.08 (H) 0.44 - 1.00 mg/dL   Calcium 9.5 8.9 - 10.3 mg/dL   Total Protein 7.1 6.5 - 8.1 g/dL   Albumin 4.1 3.5 - 5.0 g/dL   AST 16 15 - 41 U/L   ALT 15 0 - 44 U/L   Alkaline Phosphatase 63 38 - 126 U/L   Total Bilirubin 0.5 0.3 - 1.2 mg/dL   GFR calc non Af Amer 53 (L) >60 mL/min   GFR calc Af Amer >60  >60 mL/min   Anion gap 9 5 - 15    Comment: Performed at [facility], [address], [place], [place] [address]  Lipase, blood     Status: None   Collection Time: 12/14/18  3:34 AM  Result Value Ref Range   Lipase 22 11 - 51 U/L    Comment: Performed at [facility] [address], [place], [place] [address]  CBC with Differential     Status: None   Collection Time: 12/14/18  3:34 AM  Result Value Ref Range   WBC 9.0 4.0 - 10.5 K/uL   RBC 4.44 3.87 - 5.11 MIL/uL   Hemoglobin 14.0 12.0 - 15.0 g/dL   HCT 42.0 36.0 - 46.0 %   MCV 94.6 80.0 - 100.0 fL   MCH 31.5 26.0 - 34.0 pg   MCHC 33.3 30.0 - 36.0 g/dL   RDW 13.4 11.5 - 15.5 %   Platelets 354 150 - 400 K/uL   nRBC 0.0 0.0 - 0.2 %   Neutrophils Relative % 76 %   Neutro Abs 6.9 1.7 - 7.7 K/uL   Lymphocytes Relative 16 %   Lymphs Abs 1.5 0.7 - 4.0 K/uL   Monocytes Relative 6 %   Monocytes Absolute 0.5 0.1 - 1.0 K/uL   Eosinophils Relative 1 %   Eosinophils Absolute 0.1 0.0 - 0.5 K/uL   Basophils Relative 1 %   Basophils Absolute 0.1 0.0 - 0.1 K/uL   Immature Granulocytes 0 %   Abs Immature Granulocytes 0.02 0.00 - 0.07 K/uL    Comment: Performed at [facility], [address] [place], [place] [address]   Ct Abdomen Pelvis W Contrast  Result Date: 12/14/2018 CLINICAL DATA:  Right upper quadrant pain with nausea and vomiting EXAM: CT ABDOMEN AND PELVIS WITH CONTRAST TECHNIQUE: Multidetector CT imaging of the abdomen and pelvis was performed using the standard protocol following bolus administration of intravenous contrast. CONTRAST:  174mL OMNIPAQUE IOHEXOL 300 MG/ML  SOLN COMPARISON:  None. FINDINGS: Lower chest:  No contributory findings. Hepatobiliary: Multiple simple appearing hepatic cysts scattered throughout the liver. The largest measures 26 mm in the central liver.Full gallbladder with possible dependent calculi. No surrounding stranding. No common bile duct dilatation. Pancreas: Unremarkable. Spleen:  Unremarkable. Adrenals/Urinary Tract: Negative adrenals. No hydronephrosis or stone. Unremarkable bladder. Stomach/Bowel: No obstruction. No appendicitis. Left colonic diverticulosis. Vascular/Lymphatic: No acute vascular abnormality. No mass or adenopathy. Reproductive:Hysterectomy. Asymmetry at the vaginal cuff is likely related to scarring on the right. Other:  No ascites or pneumoperitoneum. Musculoskeletal: No acute abnormalities. IMPRESSION: 1. Suspect small layering calculi. The gallbladder is full but there is no definite inflammation for acute cholecystitis. 2. Colonic diverticulosis. 3. Simple hepatic cysts. Electronically Signed   By: Monte Fantasia M.D.   On: 12/14/2018 05:13   US Abdomen Limited Ruq  Result Date: 12/14/2018 CLINICAL DATA:  Right upper quadrant pain. EXAM: ULTRASOUND ABDOMEN LIMITED RIGHT UPPER QUADRANT COMPARISON:  Abdominal CT earlier this day. FINDINGS: Gallbladder: Distended with small gallstones, largest stone measuring 4 mm. No gallbladder wall thickening or pericholecystic fluid. No sonographic Murphy sign noted by sonographer. Common bile duct: Diameter: 7 mm, upper normal.  No visualized choledocholithiasis. Liver: Multiple cysts throughout the liver including 2.9 cm complex cyst in the left lobe. No evidence of solid lesion. Within normal limits in parenchymal echogenicity. Portal vein is patent on color Doppler imaging with normal direction of blood flow towards the liver. Other: No ascites. IMPRESSION: 1. Gallstones with mild gallbladder distension. No secondary findings to suggest acute cholecystitis. 2. Borderline common bile duct at 7 mm. Recommend correlation with LFTs. If elevated, consider further evaluation with MRCP, if patient is able to tolerate breath hold technique. Electronically Signed   By: Keith Rake M.D.   On: 12/14/2018 06:57    Pending Labs Unresulted Labs (From admission, onward)    Start     Ordered   12/15/18 [id number]  Basic metabolic panel   Tomorrow morning,   R     12/14/18 0812   12/15/18 0500  CBC  Tomorrow morning,   R     12/14/18 0812   12/15/18 0500  Hepatic function panel  Tomorrow morning,   R     12/14/18 0831   12/14/18 0811  HIV Antibody (routine testing w rflx)  (HIV Antibody (Routine testing w reflex) panel)  Once,   STAT     12/14/18 0812          Vitals/Pain Today's Vitals   12/14/18 0915 12/14/18 1019 12/14/18 1021 12/14/18 1023  BP:   119/74   Pulse: 78  79   Resp:   16   Temp:   98.6 F (37 C)   TempSrc:   Oral   SpO2: 99%  100%   Weight:      Height:      PainSc:  5  6  6      Isolation Precautions No active isolations  Medications Medications  aspirin EC tablet 81 mg (81 mg Oral Given 12/14/18 1029)  estradiol (ESTRACE) tablet 0.5 mg (has no administration in time range)  levothyroxine (SYNTHROID) tablet 100 mcg (100 mcg Oral Not Given 12/14/18 0845)  fluticasone (FLONASE) 50 MCG/ACT nasal spray 1 spray (has no administration in time range)  enoxaparin (LOVENOX) injection 40 mg (has no administration in time range)  acetaminophen (TYLENOL) tablet 650 mg (has no administration in time range)    Or  acetaminophen (TYLENOL) suppository 650 mg (has no administration in time range)  ondansetron (ZOFRAN) tablet 4 mg ( Oral See Alternative 12/14/18 1031)    Or  ondansetron (ZOFRAN) injection 4 mg (4 mg Intravenous Given 12/14/18 1031)  lactated ringers infusion (1,000 mLs Intravenous New Bag/Given 12/14/18 0840)  morphine 2 MG/ML injection 2 mg (2 mg Intravenous Given 12/14/18 1031)  pantoprazole (PROTONIX) injection 40 mg (40 mg Intravenous Given 12/14/18 1029)  sodium chloride 0.9 % bolus 1,000 mL (0 mLs Intravenous Stopped 12/14/18 0529)  ondansetron (ZOFRAN) injection 4 mg (4 mg Intravenous Given 12/14/18  0354)  morphine 4 MG/ML injection 4 mg (4 mg Intravenous Given 12/14/18 0354)  iohexol (OMNIPAQUE) 300 MG/ML solution 100 mL (100 mLs Intravenous Contrast Given 12/14/18 0504)   morphine 4 MG/ML injection 4 mg (4 mg Intravenous Given 12/14/18 0720)  promethazine (PHENERGAN) injection 12.5 mg (12.5 mg Intravenous Given 12/14/18 0719)    Mobility walks Low fall risk   Focused Assessments Cardiac Assessment Handoff:    No results found for: CKTOTAL, CKMB, CKMBINDEX, TROPONINI Lab Results  Component Value Date   DDIMER 0.49 09/25/2016   Does the Patient currently have chest pain? no    R Recommendations: See Admitting Provider Note  Report given to:   Additional Notes:

## 2018-12-14 NOTE — ED Notes (Signed)
Lunch Tray Order @ 1012.  

## 2018-12-14 NOTE — H&P (Signed)
History and Physical    Megan Valentine [id number] DOB: Nov 17, 1950 DOA: 12/14/2018  PCP: Libby Maw, MD Consultants:  Zigmund Daniel - urology; [place]; Hollie Salk - nephrology; Devashwar - rheumatology Patient coming from:  Home - lives alone; NOK:  Boyfriend, [phone]  Chief Complaint: abdominal pain  HPI: Megan Valentine is a 68 y.o. female with medical history significant of hypothyroidism; HLD; CKD; and IBS presenting with abdominal pain.  She reports n/v last night about 11pm.  Severe epigastric pain.  Recurrent n/v.  She felt well yesterday, walked 2.5 miles without difficulty.  She had chili for dinner with chow chow on it.  Denies fever.  She had COVID infection about 6 weeks ago and no further symptoms.  Last emesis was about 3AM.  She reports that the pain radiates around her right side into her back.   ED Course:  Epigastric/RUQ pain with vomiting.  CT unremarkable.  RUQ [place] with tiny stones,slight CBD diltation.  ?cholecystitis vs. Gastritis.     Review of Systems: As per HPI; otherwise review of systems reviewed and negative.   Ambulatory Status:  Ambulates without assistance  Past Medical History:  Diagnosis Date   Allergy    Arthritis    Cataract    Chronic bronchitis (H[facility])    Complication of anesthesia    hard to wake up   Diverticulosis    Environmental allergies    GERD (gastroesophageal reflux disease)    Hiatal hernia    History of chicken pox    Hyperlipidemia    Hypothyroidism    IBS (irritable bowel syndrome)    Migraines    Mitral valve prolapse    Rectal prolapse     Past Surgical History:  Procedure Laterality Date   ABDOMINAL HYSTERECTOMY  1999   APPENDECTOMY  1962   BREAST CYST ASPIRATION Bilateral    EYE SURGERY     JOINT REPLACEMENT     rt knee scope   REFRACTIVE SURGERY  2000   both eyes   TOTAL KNEE ARTHROPLASTY Right 03/24/2015   Procedure: TOTAL KNEE ARTHROPLASTY;  Surgeon: [name], MD;  Location: [facility];  Service: Orthopedics;  Laterality: Right;   TUBAL LIGATION  1977    Social History   Socioeconomic History   Marital status: Widowed    Spouse name: Not on file   Number of children: Not on file   Years of education: 12   Highest education level: Not on file  Occupational History   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    Employer: [employer]  Social Needs   Financial resource strain: Not on file   Food insecurity    Worry: Not on file    Inability: Not on file   Transportation needs    Medical: Not on file    Non-medical: Not on file  Tobacco Use   Smoking status: Former Smoker    Types: Cigarettes    Quit date: 03/23/1976    Years since quitting: 42.7   Smokeless tobacco: Never Used  Substance and Sexual Activity   Alcohol use: No   Drug use: No   Sexual activity: Yes    Birth control/protection: Abstinence  Lifestyle   Physical activity    Days per week: Not on file    Minutes per session: Not on file   Stress: Not on file  Relationships   Social connections    Talks on phone: Not on file  Gets together: Not on file    Attends religious service: Not on file    Active member of club or organization: Not on file    Attends meetings of clubs or organizations: Not on file    Relationship status: Not on file   Intimate partner violence    Fear of current or ex partner: Not on file    Emotionally abused: Not on file    Physically abused: Not on file    Forced sexual activity: Not on file  Other Topics Concern   Not on file  Social History Narrative   Caffeine Use-yes   Regular exercise-no          Allergies  Allergen Reactions   Tetracycline Hives and Itching    Family History  Problem Relation Age of Onset   Stomach cancer Maternal Grandmother    Hypertension Maternal Grandmother    Diabetes Maternal Grandmother    Stroke Maternal Grandmother    Cancer Maternal Grandmother         Stomach cancer   Colon cancer Paternal Grandfather    Heart attack Paternal Grandfather    Hyperlipidemia Mother    Diabetes Mother    Hypertension Mother    Alzheimer's disease Mother    Diabetes Father    Hyperlipidemia Father    Heart disease Father    Heart attack Father    Stroke Paternal Grandmother     Prior to Admission medications   Medication Sig Start Date End Date Taking? Authorizing Provider  albuterol (VENTOLIN HFA) 108 (90 Base) MCG/ACT inhaler INHALE 2 PUFFS INTO THE LUNGS EVERY 6 HOURS AS NEEDED FOR WHEEZING OR SHORTNESS OF BREATH 11/09/18   Libby Maw, MD  aspirin 81 MG chewable tablet Chew 81 mg daily by mouth.    [provider]  estradiol (ESTRACE) 0.5 MG tablet TAKE 1 TABLET(0.5 MG) BY MOUTH DAILY 10/03/18   Libby Maw, MD  fluticasone [facility]) 50 MCG/ACT nasal spray Place 1 spray into both nostrils daily. 04/23/18   Libby Maw, MD  furosemide (LASIX) 20 MG tablet Take 1 tablet (20 mg total) by mouth daily as needed. 12/03/18   Libby Maw, MD  levothyroxine (SYNTHROID) 100 MCG tablet TAKE 1 TABLET BY MOUTH DAILY....NEED ANNUAL FOR REFILLS 10/03/18   Libby Maw, MD  meclizine (ANTIVERT) 25 MG tablet Take 25 mg by mouth daily as needed for dizziness.     [provider]  meloxicam (MOBIC) 15 MG tablet TAKE 1 TABLET(15 MG) BY MOUTH DAILY FOR 2 WEEKS THEN AS NEEDED 08/30/18   Libby Maw, MD  pantoprazole (PROTONIX) 20 MG tablet TAKE 1 TABLET(20 MG) BY MOUTH DAILY 07/03/18   Libby Maw, MD  potassium chloride SA (K-DUR) 20 MEQ tablet Take one daily with lasix. 10/05/18   Libby Maw, MD  triamcinolone ointment (KENALOG) 0.1 % Apply 1 application topically 2 (two) times daily. To rash on arms. Avoid face and private areas. 11/16/17   Libby Maw, MD  vitamin B-12 (CYANOCOBALAMIN) 100 MCG tablet Take 500 mcg by mouth daily.    [provider]    Physical Exam: Vitals:   12/14/18 0400 12/14/18 0415 12/14/18 0430 12/14/18 0618  BP: 125/72 124/66 121/67 119/65  Pulse: 80 78 80 85  Resp:    15  Temp:      TempSrc:      SpO2: 100% 100% 92% 100%  Weight:      Height:  General:  Appears calm and comfortable and is NAD  Eyes:  PERRL, EOMI, normal lids, iris  ENT:  grossly normal hearing, lips & tongue, mmm; appropriate dentition  Neck:  no LAD, masses or thyromegaly  Cardiovascular:  RRR, no m/r/g. No LE edema.   Respiratory:   CTA bilaterally with no wheezes/rales/rhonchi.  Normal respiratory effort.  Abdomen:  soft, midepigastric TTP, ND, NABS  Skin:  no rash or induration seen on limited exam  Musculoskeletal:  grossly normal tone BUE/BLE, good ROM, no bony abnormality  Psychiatric:  grossly normal mood and affect, speech fluent and appropriate, AOx3  Neurologic:  CN 2-12 grossly intact, moves all extremities in coordinated fashion, sensation intact    Radiological Exams on Admission: Ct Abdomen Pelvis W Contrast  Result Date: 12/14/2018 CLINICAL DATA:  Right upper quadrant pain with nausea and vomiting EXAM: CT ABDOMEN AND PELVIS WITH CONTRAST TECHNIQUE: Multidetector CT imaging of the abdomen and pelvis was performed using the standard protocol following bolus administration of intravenous contrast. CONTRAST:  162mL OMNIPAQUE IOHEXOL 300 MG/ML  SOLN COMPARISON:  None. FINDINGS: Lower chest:  No contributory findings. Hepatobiliary: Multiple simple appearing hepatic cysts scattered throughout the liver. The largest measures 26 mm in the central liver.Full gallbladder with possible dependent calculi. No surrounding stranding. No common bile duct dilatation. Pancreas: Unremarkable. Spleen: Unremarkable. Adrenals/Urinary Tract: Negative adrenals. No hydronephrosis or stone. Unremarkable bladder. Stomach/Bowel: No obstruction. No appendicitis. Left colonic diverticulosis. Vascular/Lymphatic: No  acute vascular abnormality. No mass or adenopathy. Reproductive:Hysterectomy. Asymmetry at the vaginal cuff is likely related to scarring on the right. Other: No ascites or pneumoperitoneum. Musculoskeletal: No acute abnormalities. IMPRESSION: 1. Suspect small layering calculi. The gallbladder is full but there is no definite inflammation for acute cholecystitis. 2. Colonic diverticulosis. 3. Simple hepatic cysts. Electronically Signed   By: Monte Fantasia M.D.   On: 12/14/2018 05:13   US Abdomen Limited Ruq  Result Date: 12/14/2018 CLINICAL DATA:  Right upper quadrant pain. EXAM: ULTRASOUND ABDOMEN LIMITED RIGHT UPPER QUADRANT COMPARISON:  Abdominal CT earlier this day. FINDINGS: Gallbladder: Distended with small gallstones, largest stone measuring 4 mm. No gallbladder wall thickening or pericholecystic fluid. No sonographic Murphy sign noted by sonographer. Common bile duct: Diameter: 7 mm, upper normal.  No visualized choledocholithiasis. Liver: Multiple cysts throughout the liver including 2.9 cm complex cyst in the left lobe. No evidence of solid lesion. Within normal limits in parenchymal echogenicity. Portal vein is patent on color Doppler imaging with normal direction of blood flow towards the liver. Other: No ascites. IMPRESSION: 1. Gallstones with mild gallbladder distension. No secondary findings to suggest acute cholecystitis. 2. Borderline common bile duct at 7 mm. Recommend correlation with LFTs. If elevated, consider further evaluation with MRCP, if patient is able to tolerate breath hold technique. Electronically Signed   By: Keith Rake M.D.   On: 12/14/2018 06:57    EKG: not done   Labs on Admission: I have personally reviewed the available labs and imaging studies at the time of the admission.  Pertinent labs:   Glucose 155 BUN 19/Creatinine 1.08/GFR 53 Normal CBC COVID POSITIVE on 10/8   Assessment/Plan Principal Problem:   Abdominal pain Active Problems:    Hypothyroidism   Hyperlipidemia   Essential hypertension   CKD (chronic kidney disease) stage 3, GFR 30-59 ml/min   Abdominal pain -Patient presenting with acute onset of midepigastric abdominal pain with radiation to the RUQ and back and n/v last night after eating chili -Work-up generally unremarkable including normal CBC  and normal LFTs -Abd CT with gallstones and mild CBD dilation -RUQ [place] non-diagnostic -Could consider HIDA scan and/or MRCP but given improvement in symptoms already, acute onset of symptoms, and normal LFTs, for now will observe without further evaluation -Will observe on Med Surg -IVF -Pain control with morphine -Nausea control with Zofran -Protonix IV BID for now, as gastritis/ulcer is also a consideration (on Mobic, symptoms after eating chili with chow chow) -If improving, possibly home tomorrow; if not, consider further gallbladder evaluation  HTN -She does not appear to be on medications for this issue at this time -Good control while in ER  HLD -She does not appear to be on medications for this issue at this time -LDL was 117 in 9/19 -Outpatient f/u  Stage 3a CKD -Appears to be stable -Followed by nephrology as an outpatient -Will repeat BMP in AM  Hypothyroidism -Normal TSH in 8/20 -Continue Synthroid at current dose for now  Hyperglycemia -May be stress response -Generally normal glucose during other recent visits -Will follow with fasting AM labs -It is unlikely that she will need acute or chronic treatment for this issue at this time  H/o COVID-19 infection -Patient with no current symptoms associated with recent infection -Her test was positive on 10/8 -Since COVID patients can have a positive test for up to 90 days post-infection without being infectious, there does not appear to be an indication for repeat testing at this time    DVT prophylaxis: Lovenox  Code Status:  Full - confirmed with patient/family Family Communication:  Boyfriend was present throughout evaluation  Disposition Plan:  Home once clinically improved Consults called: None  Admission status: It is my clinical opinion that referral for OBSERVATION is reasonable and necessary in this patient based on the above information provided. The aforementioned taken together are felt to place the patient at high risk for further clinical deterioration. However it is anticipated that the patient may be medically stable for discharge from the hospital within 24 to 48 hours.    Karmen Bongo MD [facility]   How to contact the [facility] Attending or Consulting provider [place] or covering provider during after hours [place], for this patient?  1. Check the care team in [facility] and look for a) attending/consulting [facility] provider listed and b) the [facility] team listed 2. Log into [email or web address] and use [employer] universal password to access. If you do not have the password, please contact the hospital operator. 3. Locate the [facility] provider you are looking for under [facility] and page to a number that you can be directly reached. 4. If you still have difficulty reaching the provider, please page the [facility] (Director on Call) for the Hospitalists listed on amion for assistance.   12/14/2018, 8:13 AM

## 2018-12-14 NOTE — ED Notes (Signed)
Patient back from ultrasound

## 2018-12-14 NOTE — ED Notes (Signed)
Brought pt ginger ale and ordered her a clear liquid tray.  Medicated pt for 6/10 pain and gave pt warm blankets

## 2018-12-14 NOTE — ED Notes (Signed)
ED TO INPATIENT HANDOFF REPORT  ED Nurse Name and Phone #: Arsenia Goracke 75  S Name/Age/Gender Megan Valentine 68 y.o. female Room/Bed: 014C/014C  Code Status   Code Status: Full Code  Home/SNF/Other Home Patient oriented to: self, place, time and situation Is this baseline? Yes   Triage Complete: Triage complete  Chief Complaint abd pain  Triage Note  Patient BIB EMS for abdominal pain.  Patient states she ate chili beans at 1700pm and had a glass of grapefruit juice at 1900.  Patient woke up around 2300 with nausea and vomiting.  Patient had a few episodes of vomiting and called EMS around 0300.  Patient A&O x4.  One episode of emesis on arrival.  Patient states pain is R upper epigastric area and is sharp.  No hx of gallbladder issues.    Boyfriend ate same food as patient.   Allergies Allergies  Allergen Reactions  . Tetracycline Hives and Itching  . Dilaudid [Hydromorphone Hcl] Nausea And Vomiting    Level of Care/Admitting Diagnosis ED Disposition    ED Disposition Condition [facility] Area: [place] [100100]  Level of Care: Med-Surg [16]  I expect the patient will be discharged within 24 hours: Yes  LOW acuity---Tx typically complete <24 hrs---ACUTE conditions typically can be evaluated <24 hours---LABS likely to return to acceptable levels <24 hours---IS near functional baseline---EXPECTED to return to current living arrangement---NOT newly hypoxic: Meets criteria for 5C-Observation unit  Covid Evaluation: Asymptomatic Screening Protocol (No Symptoms)  Diagnosis: Abdominal pain [id number]  Admitting Physician: Karmen Bongo [[id number]]  Attending Physician: Karmen Bongo [[id number]]  PT Class (Do Not Modify): Observation [104]  PT Acc Code (Do Not Modify): Observation [10022]       B Medical/Surgery History Past Medical History:  Diagnosis Date  . Allergy   . Arthritis   . Cataract   . Chronic bronchitis ([place])   . Complication  of anesthesia    hard to wake up  . Diverticulosis   . Environmental allergies   . GERD (gastroesophageal reflux disease)   . Hiatal hernia   . History of chicken pox   . Hyperlipidemia   . Hypothyroidism   . IBS (irritable bowel syndrome)   . Migraines   . Mitral valve prolapse   . Rectal prolapse    Past Surgical History:  Procedure Laterality Date  . ABDOMINAL HYSTERECTOMY  1999  . APPENDECTOMY  1962  . BREAST CYST ASPIRATION Bilateral   . EYE SURGERY    . JOINT REPLACEMENT     rt knee scope  . REFRACTIVE SURGERY  2000   both eyes  . TOTAL KNEE ARTHROPLASTY Right 03/24/2015   Procedure: TOTAL KNEE ARTHROPLASTY;  Surgeon: [name], MD;  Location: [facility];  Service: Orthopedics;  Laterality: Right;  . TUBAL LIGATION  1977     A IV Location/Drains/Wounds Patient Lines/Drains/Airways Status   Active Line/Drains/Airways    Name:   Placement date:   Placement time:   Site:   Days:   Peripheral IV 12/14/18 Left Antecubital   12/14/18    0338    Antecubital   less than 1   Incision (Closed) 03/24/15 Knee Right   03/24/15    0927     1361          Intake/Output Last 24 hours No intake or output data in the 24 hours ending 12/14/18 1524  Labs/Imaging Results for orders placed or performed during the hospital encounter of  12/14/18 (from the past 48 hour(s))  Comprehensive metabolic panel     Status: Abnormal   Collection Time: 12/14/18  3:34 AM  Result Value Ref Range   Sodium 141 135 - 145 mmol/L   Potassium 3.5 3.5 - 5.1 mmol/L   Chloride 110 98 - 111 mmol/L   CO2 22 22 - 32 mmol/L   Glucose, Bld 155 (H) 70 - 99 mg/dL   BUN 19 8 - 23 mg/dL   Creatinine, Ser 1.08 (H) 0.44 - 1.00 mg/dL   Calcium 9.5 8.9 - 10.3 mg/dL   Total Protein 7.1 6.5 - 8.1 g/dL   Albumin 4.1 3.5 - 5.0 g/dL   AST 16 15 - 41 U/L   ALT 15 0 - 44 U/L   Alkaline Phosphatase 63 38 - 126 U/L   Total Bilirubin 0.5 0.3 - 1.2 mg/dL   GFR calc non Af Amer 53 (L) >60 mL/min   GFR calc Af Amer  >60 >60 mL/min   Anion gap 9 5 - 15    Comment: Performed at [facility], [address] [place], [place] [address]  Lipase, blood     Status: None   Collection Time: 12/14/18  3:34 AM  Result Value Ref Range   Lipase 22 11 - 51 U/L    Comment: Performed at [facility] [address], [place], [place] [address]  CBC with Differential     Status: None   Collection Time: 12/14/18  3:34 AM  Result Value Ref Range   WBC 9.0 4.0 - 10.5 K/uL   RBC 4.44 3.87 - 5.11 MIL/uL   Hemoglobin 14.0 12.0 - 15.0 g/dL   HCT 42.0 36.0 - 46.0 %   MCV 94.6 80.0 - 100.0 fL   MCH 31.5 26.0 - 34.0 pg   MCHC 33.3 30.0 - 36.0 g/dL   RDW 13.4 11.5 - 15.5 %   Platelets 354 150 - 400 K/uL   nRBC 0.0 0.0 - 0.2 %   Neutrophils Relative % 76 %   Neutro Abs 6.9 1.7 - 7.7 K/uL   Lymphocytes Relative 16 %   Lymphs Abs 1.5 0.7 - 4.0 K/uL   Monocytes Relative 6 %   Monocytes Absolute 0.5 0.1 - 1.0 K/uL   Eosinophils Relative 1 %   Eosinophils Absolute 0.1 0.0 - 0.5 K/uL   Basophils Relative 1 %   Basophils Absolute 0.1 0.0 - 0.1 K/uL   Immature Granulocytes 0 %   Abs Immature Granulocytes 0.02 0.00 - 0.07 K/uL    Comment: Performed at [facility], [address], [place], [place] [address]  HIV Antibody (routine testing w rflx)     Status: None   Collection Time: 12/14/18 10:20 AM  Result Value Ref Range   HIV Screen 4th Generation wRfx NON REACTIVE NON REACTIVE    Comment: Performed at [facility] [address] [place],  [address]   Ct Abdomen Pelvis W Contrast  Result Date: 12/14/2018 CLINICAL DATA:  Right upper quadrant pain with nausea and vomiting EXAM: CT ABDOMEN AND PELVIS WITH CONTRAST TECHNIQUE: Multidetector CT imaging of the abdomen and pelvis was performed using the standard protocol following bolus administration of intravenous contrast. CONTRAST:  122mL OMNIPAQUE IOHEXOL 300 MG/ML  SOLN COMPARISON:  None. FINDINGS: Lower chest:  No contributory findings.  Hepatobiliary: Multiple simple appearing hepatic cysts scattered throughout the liver. The largest measures 26 mm in the central liver.Full gallbladder with possible dependent calculi.  No surrounding stranding. No common bile duct dilatation. Pancreas: Unremarkable. Spleen: Unremarkable. Adrenals/Urinary Tract: Negative adrenals. No hydronephrosis or stone. Unremarkable bladder. Stomach/Bowel: No obstruction. No appendicitis. Left colonic diverticulosis. Vascular/Lymphatic: No acute vascular abnormality. No mass or adenopathy. Reproductive:Hysterectomy. Asymmetry at the vaginal cuff is likely related to scarring on the right. Other: No ascites or pneumoperitoneum. Musculoskeletal: No acute abnormalities. IMPRESSION: 1. Suspect small layering calculi. The gallbladder is full but there is no definite inflammation for acute cholecystitis. 2. Colonic diverticulosis. 3. Simple hepatic cysts. Electronically Signed   By: Monte Fantasia M.D.   On: 12/14/2018 05:13   US Abdomen Limited Ruq  Result Date: 12/14/2018 CLINICAL DATA:  Right upper quadrant pain. EXAM: ULTRASOUND ABDOMEN LIMITED RIGHT UPPER QUADRANT COMPARISON:  Abdominal CT earlier this day. FINDINGS: Gallbladder: Distended with small gallstones, largest stone measuring 4 mm. No gallbladder wall thickening or pericholecystic fluid. No sonographic Murphy sign noted by sonographer. Common bile duct: Diameter: 7 mm, upper normal.  No visualized choledocholithiasis. Liver: Multiple cysts throughout the liver including 2.9 cm complex cyst in the left lobe. No evidence of solid lesion. Within normal limits in parenchymal echogenicity. Portal vein is patent on color Doppler imaging with normal direction of blood flow towards the liver. Other: No ascites. IMPRESSION: 1. Gallstones with mild gallbladder distension. No secondary findings to suggest acute cholecystitis. 2. Borderline common bile duct at 7 mm. Recommend correlation with LFTs. If elevated, consider  further evaluation with MRCP, if patient is able to tolerate breath hold technique. Electronically Signed   By: Keith Rake M.D.   On: 12/14/2018 06:57    Pending Labs Unresulted Labs (From admission, onward)    Start     Ordered   12/15/18 [id number]  Basic metabolic panel  Tomorrow morning,   R     12/14/18 0812   12/15/18 0500  CBC  Tomorrow morning,   R     12/14/18 0812   12/15/18 0500  Hepatic function panel  Tomorrow morning,   R     12/14/18 0831          Vitals/Pain Today's Vitals   12/14/18 1200 12/14/18 1215 12/14/18 1300 12/14/18 1315  BP: 120/65 116/66 (!) 106/58 (!) 106/56  Pulse: 73 75 69 68  Resp:      Temp:      TempSrc:      SpO2: 100% 100% 100% 100%  Weight:      Height:      PainSc:        Isolation Precautions No active isolations  Medications Medications  aspirin EC tablet 81 mg (81 mg Oral Given 12/14/18 1029)  estradiol (ESTRACE) tablet 0.5 mg (has no administration in time range)  levothyroxine (SYNTHROID) tablet 100 mcg (100 mcg Oral Not Given 12/14/18 0845)  fluticasone (FLONASE) 50 MCG/ACT nasal spray 1 spray (1 spray Each Nare Not Given 12/14/18 1325)  enoxaparin (LOVENOX) injection 40 mg (has no administration in time range)  acetaminophen (TYLENOL) tablet 650 mg (has no administration in time range)    Or  acetaminophen (TYLENOL) suppository 650 mg (has no administration in time range)  ondansetron (ZOFRAN) tablet 4 mg ( Oral See Alternative 12/14/18 1031)    Or  ondansetron (ZOFRAN) injection 4 mg (4 mg Intravenous Given 12/14/18 1031)  lactated ringers infusion ( Intravenous Transfusing/Transfer 12/14/18 1505)  morphine 2 MG/ML injection 2 mg (2 mg Intravenous Given 12/14/18 1407)  pantoprazole (PROTONIX) injection 40 mg (40 mg Intravenous Given 12/14/18 1029)  sodium chloride 0.9 %  bolus 1,000 mL (0 mLs Intravenous Stopped 12/14/18 0529)  ondansetron (ZOFRAN) injection 4 mg (4 mg Intravenous Given 12/14/18 0354)  morphine 4 MG/ML  injection 4 mg (4 mg Intravenous Given 12/14/18 0354)  iohexol (OMNIPAQUE) 300 MG/ML solution 100 mL (100 mLs Intravenous Contrast Given 12/14/18 0504)  morphine 4 MG/ML injection 4 mg (4 mg Intravenous Given 12/14/18 0720)  promethazine (PHENERGAN) injection 12.5 mg (12.5 mg Intravenous Given 12/14/18 0719)    Mobility walks with person assist Low fall risk   Focused Assessments    R Recommendations: See Admitting Provider Note  Report given to:   Additional Notes:

## 2018-12-14 NOTE — ED Triage Notes (Signed)
  Patient BIB EMS for abdominal pain.  Patient states she ate chili beans at 1700pm and had a glass of grapefruit juice at 1900.  Patient woke up around 2300 with nausea and vomiting.  Patient had a few episodes of vomiting and called EMS around 0300.  Patient A&O x4.  One episode of emesis on arrival.  Patient states pain is R upper epigastric area and is sharp.  No hx of gallbladder issues.    Boyfriend ate same food as patient.

## 2018-12-14 NOTE — ED Provider Notes (Signed)
[facility] EMERGENCY DEPARTMENT Provider Note   CSN: [id number] Arrival date & time: 12/14/18  0327     History   Chief Complaint Chief Complaint  Patient presents with  . Abdominal Pain    HPI Megan Valentine is a 68 y.o. female.     Patient is a 68 year old female with past medical history of GERD, hyperlipidemia, irritable bowel, migraines, prior hysterectomy and appendectomy.  She presents today for evaluation of abdominal pain.  This began at approximately 11 PM.  She describes pain in her epigastric area and right upper quadrant along with nausea and vomiting.  She denies any fevers or chills.  She denies having consumed any suspicious foods.  There are no ill contacts.  The history is provided by the patient.  Abdominal Pain Pain location:  Epigastric and RUQ Pain quality: stabbing   Pain radiates to:  Does not radiate Pain severity:  Severe Onset quality:  Sudden Duration:  4 hours Timing:  Constant Progression:  Worsening Chronicity:  New Relieved by:  Nothing Worsened by:  Movement and palpation Ineffective treatments:  None tried   Past Medical History:  Diagnosis Date  . Allergy   . Arthritis   . Cataract   . Chronic bronchitis ([place])   . Complication of anesthesia    hard to wake up  . Diverticulosis   . Environmental allergies   . GERD (gastroesophageal reflux disease)   . Hiatal hernia   . History of chicken pox   . Hyperlipidemia   . Hypothyroidism   . IBS (irritable bowel syndrome)   . Migraines   . Mitral valve prolapse   . Rectal prolapse     Patient Active Problem List   Diagnosis Date Noted  . Sinus pressure 11/12/2018  . History of 2019 novel coronavirus disease (COVID-19) 11/12/2018  . Need for influenza vaccination 10/05/2018  . Pleurisy 06/05/2018  . Reactive airway disease 05/07/2018  . CKD (chronic kidney disease) stage 3, GFR 30-59 ml/min 01/15/2018  . Edema 12/15/2017  . Insomnia 11/20/2017  .  Seborrheic dermatitis 11/16/2017  . Seasonal allergic rhinitis due to pollen 11/16/2017  . Rhus dermatitis 10/19/2017  . Viral upper respiratory tract infection 10/19/2017  . Menopausal and female climacteric states 10/19/2017  . Vitamin D deficiency 10/19/2017  . Need for vaccination for H flu type B 10/19/2017  . Ceruminosis, right 06/12/2017  . Acute sinusitis 07/02/2015  . Constipation 03/26/2015  . Degenerative arthritis of knee, bilateral 11/17/2014  . Screening for osteoporosis 04/25/2014  . Hypokalemia 09/15/2011  . Hypothyroidism 08/21/2008  . Hyperlipidemia 08/21/2008  . Essential hypertension 08/21/2008  . MITRAL VALVE PROLAPSE 08/21/2008    Past Surgical History:  Procedure Laterality Date  . ABDOMINAL HYSTERECTOMY  1999  . APPENDECTOMY  1962  . BREAST CYST ASPIRATION Bilateral   . EYE SURGERY    . JOINT REPLACEMENT     rt knee scope  . REFRACTIVE SURGERY  2000   both eyes  . TOTAL KNEE ARTHROPLASTY Right 03/24/2015   Procedure: TOTAL KNEE ARTHROPLASTY;  Surgeon: [name], MD;  Location: [facility];  Service: Orthopedics;  Laterality: Right;  . TUBAL LIGATION  1977     OB History   No obstetric history on file.      Home Medications    Prior to Admission medications   Medication Sig Start Date End Date Taking? Authorizing Provider  albuterol (VENTOLIN HFA) 108 (90 Base) MCG/ACT inhaler INHALE 2 PUFFS INTO THE LUNGS EVERY  6 HOURS AS NEEDED FOR WHEEZING OR SHORTNESS OF BREATH 11/09/18   Libby Maw, MD  aspirin 81 MG chewable tablet Chew 81 mg daily by mouth.    [provider]  estradiol (ESTRACE) 0.5 MG tablet TAKE 1 TABLET(0.5 MG) BY MOUTH DAILY 10/03/18   Libby Maw, MD  fluticasone [facility]) 50 MCG/ACT nasal spray Place 1 spray into both nostrils daily. 04/23/18   Libby Maw, MD  furosemide (LASIX) 20 MG tablet Take 1 tablet (20 mg total) by mouth daily as needed. 12/03/18   Libby Maw, MD   levothyroxine (SYNTHROID) 100 MCG tablet TAKE 1 TABLET BY MOUTH DAILY....NEED ANNUAL FOR REFILLS 10/03/18   Libby Maw, MD  meclizine (ANTIVERT) 25 MG tablet Take 25 mg by mouth daily as needed for dizziness.     [provider]  meloxicam (MOBIC) 15 MG tablet TAKE 1 TABLET(15 MG) BY MOUTH DAILY FOR 2 WEEKS THEN AS NEEDED 08/30/18   Libby Maw, MD  pantoprazole (PROTONIX) 20 MG tablet TAKE 1 TABLET(20 MG) BY MOUTH DAILY 07/03/18   Libby Maw, MD  potassium chloride SA (K-DUR) 20 MEQ tablet Take one daily with lasix. 10/05/18   Libby Maw, MD  triamcinolone ointment (KENALOG) 0.1 % Apply 1 application topically 2 (two) times daily. To rash on arms. Avoid face and private areas. 11/16/17   Libby Maw, MD  vitamin B-12 (CYANOCOBALAMIN) 100 MCG tablet Take 500 mcg by mouth daily.    [provider]    Family History Family History  Problem Relation Age of Onset  . Stomach cancer Maternal Grandmother   . Hypertension Maternal Grandmother   . Diabetes Maternal Grandmother   . Stroke Maternal Grandmother   . Cancer Maternal Grandmother        Stomach cancer  . Colon cancer Paternal Grandfather   . Heart attack Paternal Grandfather   . Hyperlipidemia Mother   . Diabetes Mother   . Hypertension Mother   . Alzheimer's disease Mother   . Diabetes Father   . Hyperlipidemia Father   . Heart disease Father   . Heart attack Father   . Stroke Paternal Grandmother     Social History Social History   Tobacco Use  . Smoking status: Former Smoker    Types: Cigarettes    Quit date: 03/23/1976    Years since quitting: 42.7  . Smokeless tobacco: Never Used  Substance Use Topics  . Alcohol use: No  . Drug use: No     Allergies   Tetracycline   Review of Systems Review of Systems  Gastrointestinal: Positive for abdominal pain.  All other systems reviewed and are negative.    Physical Exam Updated Vital Signs BP  130/81 (BP Location: Right Arm)   Pulse 82   Temp 98 F (36.7 C) (Oral)   Resp 20   Ht 5\' 4"  (1.626 m)   Wt 67.6 kg   SpO2 100%   BMI 25.58 kg/m   Physical Exam Vitals signs and nursing note reviewed.  Constitutional:      General: She is not in acute distress.    Appearance: She is well-developed. She is not diaphoretic.  HENT:     Head: Normocephalic and atraumatic.  Neck:     Musculoskeletal: Normal range of motion and neck supple.  Cardiovascular:     Rate and Rhythm: Normal rate and regular rhythm.     Heart sounds: No murmur. No friction rub. No gallop.  Pulmonary:     Effort: Pulmonary effort is normal. No respiratory distress.     Breath sounds: Normal breath sounds. No wheezing.  Abdominal:     General: Bowel sounds are normal. There is no distension.     Palpations: Abdomen is soft.     Tenderness: There is abdominal tenderness in the right upper quadrant and epigastric area. There is no right CVA tenderness, left CVA tenderness, guarding or rebound.  Musculoskeletal: Normal range of motion.  Skin:    General: Skin is warm and dry.  Neurological:     Mental Status: She is alert and oriented to person, place, and time.      ED Treatments / Results  Labs (all labs ordered are listed, but only abnormal results are displayed) Labs Reviewed  COMPREHENSIVE METABOLIC PANEL  LIPASE, BLOOD  CBC WITH DIFFERENTIAL/PLATELET    EKG None  Radiology No results found.  Procedures Procedures (including critical care time)  Medications Ordered in ED Medications  sodium chloride 0.9 % bolus 1,000 mL (has no administration in time range)  ondansetron (ZOFRAN) injection 4 mg (has no administration in time range)  morphine 4 MG/ML injection 4 mg (has no administration in time range)     Initial Impression / Assessment and Plan / ED Course  I have reviewed the triage vital signs and the nursing notes.  Pertinent labs & imaging results that were available during  my care of the patient were reviewed by me and considered in my medical decision making (see chart for details).  Patient presenting here with complaints of abdominal pain and vomiting that started late last night.  Patient is tender in the epigastrium and right upper quadrant.  Her presentation is consistent with a gallstone, however there is no evidence for cholecystitis on her CT scan or ultrasound.  She does have several tiny gallstones and a mildly dilated gallbladder, however no cholecystitis is evident.  Patient has been given IV fluids and repeat doses of pain and nausea medicines, but continues to have significant discomfort and vomiting.  Patient will be admitted for IV fluids and pain and nausea medication and overnight observation.  I spoken with Dr. Lorin Mercy who agrees to admit.  Final Clinical Impressions(s) / ED Diagnoses   Final diagnoses:  None    ED Discharge Orders    None       Veryl Speak, MD 12/14/18 [phone]

## 2018-12-15 DIAGNOSIS — K8000 Calculus of gallbladder with acute cholecystitis without obstruction: Secondary | ICD-10-CM | POA: Diagnosis present

## 2018-12-15 DIAGNOSIS — Z885 Allergy status to narcotic agent status: Secondary | ICD-10-CM | POA: Diagnosis not present

## 2018-12-15 DIAGNOSIS — R109 Unspecified abdominal pain: Secondary | ICD-10-CM | POA: Diagnosis not present

## 2018-12-15 DIAGNOSIS — Z96651 Presence of right artificial knee joint: Secondary | ICD-10-CM | POA: Diagnosis present

## 2018-12-15 DIAGNOSIS — I10 Essential (primary) hypertension: Secondary | ICD-10-CM | POA: Diagnosis not present

## 2018-12-15 DIAGNOSIS — Z833 Family history of diabetes mellitus: Secondary | ICD-10-CM | POA: Diagnosis not present

## 2018-12-15 DIAGNOSIS — Z8619 Personal history of other infectious and parasitic diseases: Secondary | ICD-10-CM | POA: Diagnosis not present

## 2018-12-15 DIAGNOSIS — Z7982 Long term (current) use of aspirin: Secondary | ICD-10-CM | POA: Diagnosis not present

## 2018-12-15 DIAGNOSIS — I129 Hypertensive chronic kidney disease with stage 1 through stage 4 chronic kidney disease, or unspecified chronic kidney disease: Secondary | ICD-10-CM | POA: Diagnosis present

## 2018-12-15 DIAGNOSIS — E785 Hyperlipidemia, unspecified: Secondary | ICD-10-CM | POA: Diagnosis present

## 2018-12-15 DIAGNOSIS — Z881 Allergy status to other antibiotic agents status: Secondary | ICD-10-CM | POA: Diagnosis not present

## 2018-12-15 DIAGNOSIS — K581 Irritable bowel syndrome with constipation: Secondary | ICD-10-CM | POA: Diagnosis present

## 2018-12-15 DIAGNOSIS — R1013 Epigastric pain: Secondary | ICD-10-CM | POA: Diagnosis not present

## 2018-12-15 DIAGNOSIS — K219 Gastro-esophageal reflux disease without esophagitis: Secondary | ICD-10-CM | POA: Diagnosis present

## 2018-12-15 DIAGNOSIS — Z820 Family history of epilepsy and other diseases of the nervous system: Secondary | ICD-10-CM | POA: Diagnosis not present

## 2018-12-15 DIAGNOSIS — R1011 Right upper quadrant pain: Secondary | ICD-10-CM | POA: Diagnosis present

## 2018-12-15 DIAGNOSIS — E876 Hypokalemia: Secondary | ICD-10-CM | POA: Diagnosis present

## 2018-12-15 DIAGNOSIS — Z87891 Personal history of nicotine dependence: Secondary | ICD-10-CM | POA: Diagnosis not present

## 2018-12-15 DIAGNOSIS — N1831 Chronic kidney disease, stage 3a: Secondary | ICD-10-CM | POA: Diagnosis present

## 2018-12-15 DIAGNOSIS — R739 Hyperglycemia, unspecified: Secondary | ICD-10-CM | POA: Diagnosis present

## 2018-12-15 DIAGNOSIS — R112 Nausea with vomiting, unspecified: Secondary | ICD-10-CM | POA: Diagnosis not present

## 2018-12-15 DIAGNOSIS — E039 Hypothyroidism, unspecified: Secondary | ICD-10-CM | POA: Diagnosis present

## 2018-12-15 DIAGNOSIS — K810 Acute cholecystitis: Secondary | ICD-10-CM | POA: Diagnosis not present

## 2018-12-15 DIAGNOSIS — Z800 Family history of malignant neoplasm of digestive organs: Secondary | ICD-10-CM | POA: Diagnosis not present

## 2018-12-15 DIAGNOSIS — Z8349 Family history of other endocrine, nutritional and metabolic diseases: Secondary | ICD-10-CM | POA: Diagnosis not present

## 2018-12-15 DIAGNOSIS — Z79890 Hormone replacement therapy (postmenopausal): Secondary | ICD-10-CM | POA: Diagnosis not present

## 2018-12-15 DIAGNOSIS — Z823 Family history of stroke: Secondary | ICD-10-CM | POA: Diagnosis not present

## 2018-12-15 DIAGNOSIS — Z8249 Family history of ischemic heart disease and other diseases of the circulatory system: Secondary | ICD-10-CM | POA: Diagnosis not present

## 2018-12-15 LAB — HEPATIC FUNCTION PANEL
ALT: 12 U/L (ref 0–44)
AST: 19 U/L (ref 15–41)
Albumin: 2.8 g/dL — ABNORMAL LOW (ref 3.5–5.0)
Alkaline Phosphatase: 52 U/L (ref 38–126)
Bilirubin, Direct: 0.3 mg/dL — ABNORMAL HIGH (ref 0.0–0.2)
Indirect Bilirubin: 0.9 mg/dL (ref 0.3–0.9)
Total Bilirubin: 1.2 mg/dL (ref 0.3–1.2)
Total Protein: 5.1 g/dL — ABNORMAL LOW (ref 6.5–8.1)

## 2018-12-15 LAB — URINALYSIS, ROUTINE W REFLEX MICROSCOPIC
Bilirubin Urine: NEGATIVE
Glucose, UA: NEGATIVE mg/dL
Hgb urine dipstick: NEGATIVE
Ketones, ur: NEGATIVE mg/dL
Leukocytes,Ua: NEGATIVE
Nitrite: NEGATIVE
Protein, ur: NEGATIVE mg/dL
Specific Gravity, Urine: 1.004 — ABNORMAL LOW (ref 1.005–1.030)
pH: 6 (ref 5.0–8.0)

## 2018-12-15 LAB — CBC
HCT: 35.5 % — ABNORMAL LOW (ref 36.0–46.0)
Hemoglobin: 12.2 g/dL (ref 12.0–15.0)
MCH: 32.6 pg (ref 26.0–34.0)
MCHC: 34.4 g/dL (ref 30.0–36.0)
MCV: 94.9 fL (ref 80.0–100.0)
Platelets: 245 10*3/uL (ref 150–400)
RBC: 3.74 MIL/uL — ABNORMAL LOW (ref 3.87–5.11)
RDW: 13.7 % (ref 11.5–15.5)
WBC: 8.7 10*3/uL (ref 4.0–10.5)
nRBC: 0 % (ref 0.0–0.2)

## 2018-12-15 LAB — BASIC METABOLIC PANEL
Anion gap: 9 (ref 5–15)
BUN: 9 mg/dL (ref 8–23)
CO2: 22 mmol/L (ref 22–32)
Calcium: 8.2 mg/dL — ABNORMAL LOW (ref 8.9–10.3)
Chloride: 109 mmol/L (ref 98–111)
Creatinine, Ser: 0.9 mg/dL (ref 0.44–1.00)
GFR calc Af Amer: >60 mL/min (ref >60)
GFR calc non Af Amer: >60 mL/min (ref >60)
Glucose, Bld: 93 mg/dL (ref 70–99)
Potassium: 3.2 mmol/L — ABNORMAL LOW (ref 3.5–5.1)
Sodium: 140 mmol/L (ref 135–145)

## 2018-12-15 MED ORDER — POTASSIUM CHLORIDE IN NACL 40-0.9 MEQ/L-% IV SOLN
INTRAVENOUS | Status: DC
  Administered 2018-12-15 – 2018-12-18 (×6): 75 mL/h via INTRAVENOUS
  Filled 2018-12-15 (×5): qty 1000

## 2018-12-15 MED ORDER — MORPHINE SULFATE (PF) 2 MG/ML IV SOLN
1.0000 mg | INTRAVENOUS | Status: DC | PRN
  Administered 2018-12-15 – 2018-12-18 (×5): 1 mg via INTRAVENOUS
  Filled 2018-12-15 (×4): qty 1

## 2018-12-15 NOTE — Progress Notes (Addendum)
Progress Note    Megan Valentine  [id number] DOB: [birth year]  DOA: 12/14/2018 PCP: Libby Maw, MD    Brief Narrative:     Medical records reviewed and are as summarized below:  Megan Valentine is an 68 y.o. female with medical history significant of hypothyroidism; HLD; CKD; and IBS presenting with abdominal pain.  She reports n/v last night about 11pm.  Severe right sided pain.  Recurrent n/v.  She felt well yesterday, walked 2.5 miles without difficulty.  She had chili for dinner with chow chow on it.  Denies fever.  She had COVID infection about 6 weeks ago and no further symptoms.  Last emesis was about 3AM.  She reports that the pain radiates around her right side into her back.  Assessment/Plan:   Principal Problem:   Abdominal pain Active Problems:   Hypothyroidism   Hyperlipidemia   Essential hypertension   CKD (chronic kidney disease) stage 3, GFR 30-59 ml/min   Abdominal pain -Patient presenting with acute onset of  abdominal pain with radiation from the RUQ and back and n/v last night after eating chili -lipase negative -not in the position one would expect for ulcer disease -Work-up generally unremarkable including normal CBC and normal LFTs -Abd CT with gallstones and mild CBD dilation -RUQ [place] non-diagnostic - HIDA  -IVF -Pain control with morphine -Nausea control with Zofran -Protonix IV BID for now, as gastritis/ulcer is also a consideration (on Mobic, symptoms after eating chili with chow chow) -get U/A to r/o blood/ascneding infection (not in a typical location for pyelo)  HTN -She does not appear to be on medications for this issue at this time  HLD -She does not appear to be on medications for this issue at this time -LDL was 117 in 9/19 -Outpatient f/u  Stage 3a CKD -Appears to be stable -Followed by nephrology as an outpatient  Hypokalemia -replete in IVF as not taking in much PO  Hypothyroidism -Normal TSH in  8/20 -Continue Synthroid at current dose for now  Hyperglycemia -May be stress response  H/o COVID-19 infection -Patient with no current symptoms associated with recent infection -Her test was positive on 10/8 -Since COVID patients can have a positive test for up to 90 days post-infection without being infectious, there does not appear to be an indication for repeat testing at this time   Family Communication/Anticipated D/C date and plan/Code Status   DVT prophylaxis: Lovenox ordered. Code Status: Full Code.  Family Communication: spouse at bedside Disposition Plan: pending replacement of electrolytes, HIDA and ability to tolerate food   Medical Consultants:    None.    Subjective:   Only able to take a few sips  Objective:    Vitals:   12/14/18 2030 12/14/18 2140 12/15/18 0532 12/15/18 0913  BP: (!) 92/52 (!) 90/57 (!) 91/58 105/61  Pulse:  87 76 78  Resp:  16 15   Temp:  99.1 F (37.3 C) 98.8 F (37.1 C)   TempSrc:  Oral Oral   SpO2:  100% 98%   Weight:      Height:        Intake/Output Summary (Last 24 hours) at 12/15/2018 1042 Last data filed at 12/15/2018 0400 Gross per 24 hour  Intake 1851.29 ml  Output --  Net 1851.29 ml   Filed Weights   12/14/18 0334  Weight: 67.6 kg    Exam: Uncomfortable appearing Abdominal exam un-impressive rrr No increased work of breathing Flushed face  Data  Reviewed:   I have personally reviewed following labs and imaging studies:  Labs: Labs show the following:   Basic Metabolic Panel: Recent Labs  Lab 12/14/18 0334 12/15/18 0453  NA 141 140  K 3.5 3.2*  CL 110 109  CO2 22 22  GLUCOSE 155* 93  BUN 19 9  CREATININE 1.08* 0.90  CALCIUM 9.5 8.2*   GFR Estimated Creatinine Clearance: 56.6 mL/min (by C-G formula based on SCr of 0.9 mg/dL). Liver Function Tests: Recent Labs  Lab 12/14/18 0334 12/15/18 0453  AST 16 19  ALT 15 12  ALKPHOS 63 52  BILITOT 0.5 1.2  PROT 7.1 5.1*  ALBUMIN 4.1  2.8*   Recent Labs  Lab 12/14/18 0334  LIPASE 22   No results for input(s): AMMONIA in the last 168 hours. Coagulation profile No results for input(s): INR, PROTIME in the last 168 hours.  CBC: Recent Labs  Lab 12/14/18 0334 12/15/18 0453  WBC 9.0 8.7  NEUTROABS 6.9  --   HGB 14.0 12.2  HCT 42.0 35.5*  MCV 94.6 94.9  PLT 354 245   Cardiac Enzymes: No results for input(s): CKTOTAL, CKMB, CKMBINDEX, TROPONINI in the last 168 hours. BNP (last 3 results) No results for input(s): PROBNP in the last 8760 hours. CBG: No results for input(s): GLUCAP in the last 168 hours. D-Dimer: No results for input(s): DDIMER in the last 72 hours. Hgb A1c: No results for input(s): HGBA1C in the last 72 hours. Lipid Profile: No results for input(s): CHOL, HDL, LDLCALC, TRIG, CHOLHDL, LDLDIRECT in the last 72 hours. Thyroid function studies: No results for input(s): TSH, T4TOTAL, T3FREE, THYROIDAB in the last 72 hours.  Invalid input(s): FREET3 Anemia work up: No results for input(s): VITAMINB12, FOLATE, FERRITIN, TIBC, IRON, RETICCTPCT in the last 72 hours. Sepsis Labs: Recent Labs  Lab 12/14/18 0334 12/15/18 0453  WBC 9.0 8.7    Microbiology No results found for this or any previous visit (from the past 240 hour(s)).  Procedures and diagnostic studies:  Ct Abdomen Pelvis W Contrast  Result Date: 12/14/2018 CLINICAL DATA:  Right upper quadrant pain with nausea and vomiting EXAM: CT ABDOMEN AND PELVIS WITH CONTRAST TECHNIQUE: Multidetector CT imaging of the abdomen and pelvis was performed using the standard protocol following bolus administration of intravenous contrast. CONTRAST:  136mL OMNIPAQUE IOHEXOL 300 MG/ML  SOLN COMPARISON:  None. FINDINGS: Lower chest:  No contributory findings. Hepatobiliary: Multiple simple appearing hepatic cysts scattered throughout the liver. The largest measures 26 mm in the central liver.Full gallbladder with possible dependent calculi. No  surrounding stranding. No common bile duct dilatation. Pancreas: Unremarkable. Spleen: Unremarkable. Adrenals/Urinary Tract: Negative adrenals. No hydronephrosis or stone. Unremarkable bladder. Stomach/Bowel: No obstruction. No appendicitis. Left colonic diverticulosis. Vascular/Lymphatic: No acute vascular abnormality. No mass or adenopathy. Reproductive:Hysterectomy. Asymmetry at the vaginal cuff is likely related to scarring on the right. Other: No ascites or pneumoperitoneum. Musculoskeletal: No acute abnormalities. IMPRESSION: 1. Suspect small layering calculi. The gallbladder is full but there is no definite inflammation for acute cholecystitis. 2. Colonic diverticulosis. 3. Simple hepatic cysts. Electronically Signed   By: Monte Fantasia M.D.   On: 12/14/2018 05:13   US Abdomen Limited Ruq  Result Date: 12/14/2018 CLINICAL DATA:  Right upper quadrant pain. EXAM: ULTRASOUND ABDOMEN LIMITED RIGHT UPPER QUADRANT COMPARISON:  Abdominal CT earlier this day. FINDINGS: Gallbladder: Distended with small gallstones, largest stone measuring 4 mm. No gallbladder wall thickening or pericholecystic fluid. No sonographic Murphy sign noted by sonographer. Common bile duct: Diameter:  7 mm, upper normal.  No visualized choledocholithiasis. Liver: Multiple cysts throughout the liver including 2.9 cm complex cyst in the left lobe. No evidence of solid lesion. Within normal limits in parenchymal echogenicity. Portal vein is patent on color Doppler imaging with normal direction of blood flow towards the liver. Other: No ascites. IMPRESSION: 1. Gallstones with mild gallbladder distension. No secondary findings to suggest acute cholecystitis. 2. Borderline common bile duct at 7 mm. Recommend correlation with LFTs. If elevated, consider further evaluation with MRCP, if patient is able to tolerate breath hold technique. Electronically Signed   By: Keith Rake M.D.   On: 12/14/2018 06:57    Medications:    aspirin EC   81 mg Oral Daily   enoxaparin (LOVENOX) injection  40 mg Subcutaneous Daily   estradiol  0.5 mg Oral Daily   fluticasone  1 spray Each Nare Daily   levothyroxine  100 mcg Oral Q0600   pantoprazole (PROTONIX) IV  40 mg Intravenous Q12H   Continuous Infusions:  lactated ringers 100 mL/hr at 12/15/18 1035     LOS: 0 days   Geradine Girt  [facility]   How to contact the [facility] Attending or Consulting provider [place] or covering provider during after hours [place], for this patient?  1. Check the care team in [facility] and look for a) attending/consulting [facility] provider listed and b) the [facility] team listed 2. Log into [email or web address] and use [place]'s universal password to access. If you do not have the password, please contact the hospital operator. 3. Locate the [facility] provider you are looking for under [facility] and page to a number that you can be directly reached. 4. If you still have difficulty reaching the provider, please page the [facility] (Director on Call) for the Hospitalists listed on amion for assistance.  12/15/2018, 10:42 AM

## 2018-12-15 NOTE — Progress Notes (Signed)
Chaplain responded to consult for prayer. Megan Valentine and her boyfriend were both very receptive for prayer and conversation. Chaplain remains available for support as needs arise.   Chaplain Resident, Evelene Croon, M Div

## 2018-12-16 ENCOUNTER — Inpatient Hospital Stay (HOSPITAL_COMMUNITY): Payer: PPO

## 2018-12-16 DIAGNOSIS — N1831 Chronic kidney disease, stage 3a: Secondary | ICD-10-CM

## 2018-12-16 DIAGNOSIS — R112 Nausea with vomiting, unspecified: Secondary | ICD-10-CM

## 2018-12-16 DIAGNOSIS — R1011 Right upper quadrant pain: Secondary | ICD-10-CM

## 2018-12-16 LAB — SURGICAL PCR SCREEN
MRSA, PCR: NEGATIVE
Staphylococcus aureus: NEGATIVE

## 2018-12-16 MED ORDER — MORPHINE SULFATE (PF) 4 MG/ML IV SOLN
INTRAVENOUS | Status: AC
  Administered 2018-12-16: 2.5 mg via INTRAVENOUS
  Filled 2018-12-16: qty 1

## 2018-12-16 MED ORDER — ACETAMINOPHEN 500 MG PO TABS
1000.0000 mg | ORAL_TABLET | ORAL | Status: AC
  Administered 2018-12-17: 1000 mg via ORAL
  Filled 2018-12-16: qty 2

## 2018-12-16 MED ORDER — DEXAMETHASONE SODIUM PHOSPHATE 4 MG/ML IJ SOLN
4.0000 mg | INTRAMUSCULAR | Status: AC
  Administered 2018-12-17: 4 mg via INTRAVENOUS
  Filled 2018-12-16: qty 1

## 2018-12-16 MED ORDER — CHLORHEXIDINE GLUCONATE CLOTH 2 % EX PADS
6.0000 | MEDICATED_PAD | Freq: Once | CUTANEOUS | Status: AC
  Administered 2018-12-16: 6 via TOPICAL

## 2018-12-16 MED ORDER — PIPERACILLIN-TAZOBACTAM 3.375 G IVPB
3.3750 g | Freq: Three times a day (TID) | INTRAVENOUS | Status: AC
  Administered 2018-12-16 – 2018-12-17 (×3): 3.375 g via INTRAVENOUS
  Administered 2018-12-17: 3.375 mg via INTRAVENOUS
  Filled 2018-12-16 (×6): qty 50

## 2018-12-16 MED ORDER — MORPHINE SULFATE (PF) 4 MG/ML IV SOLN
2.5000 mg | Freq: Once | INTRAVENOUS | Status: AC
  Administered 2018-12-16: 11:00:00 2.5 mg via INTRAVENOUS

## 2018-12-16 MED ORDER — TECHNETIUM TC 99M MEBROFENIN IV KIT
5.4600 | PACK | Freq: Once | INTRAVENOUS | Status: AC | PRN
  Administered 2018-12-16: 5.46 via INTRAVENOUS

## 2018-12-16 MED ORDER — CHLORHEXIDINE GLUCONATE CLOTH 2 % EX PADS
6.0000 | MEDICATED_PAD | Freq: Once | CUTANEOUS | Status: AC
  Administered 2018-12-17: 6 via TOPICAL

## 2018-12-16 NOTE — Progress Notes (Signed)
Pharmacy Antibiotic Note  Megan Valentine is a 68 y.o. female admitted on 12/14/2018 with Intra-abdominal Infection.  Patient presented with significant abdominal pain and n/v. WBC wnl. Abdominal CT with gallstones and mild CBD dilation. Pharmacy has been consulted for zosyn dosing.  Plan: Zosyn 3.375g IV q8h (4 hour infusion).  F/u cultures, renal function and clinical improvement   Height: 5\' 4"  (162.6 cm) Weight: 149 lb (67.6 kg) IBW/kg (Calculated) : 54.7  Temp (24hrs), Avg:99.1 F (37.3 C), Min:98.9 F (37.2 C), Max:99.2 F (37.3 C)  Recent Labs  Lab 12/14/18 0334 12/15/18 0453  WBC 9.0 8.7  CREATININE 1.08* 0.90    Estimated Creatinine Clearance: 56.6 mL/min (by C-G formula based on SCr of 0.9 mg/dL).    Allergies  Allergen Reactions  . Tetracycline Hives and Itching  . Dilaudid [Hydromorphone Hcl] Nausea And Vomiting    Antimicrobials this admission: 11/22 Zosyn >>   Thank you for allowing pharmacy to be a part of this patient's care.  Acey Lav, PharmD  PGY1 Acute Care Pharmacy Resident [phone] 12/16/2018 1:33 PM

## 2018-12-16 NOTE — Consult Note (Signed)
Reason for Consult: Cholecystitis Referring Physician: Dr. Rolly Pancake is an 68 y.o. female.  HPI: Patient is a 68 year old female who comes in secondary to abdominal pain.  Patient states this began approximately 3 days ago.  She states this was after a meal of chili.  She states this was associated with reflux and right upper quadrant pain.  Patient states this was also associated with nausea vomiting. Patient was brought to the ER for further evaluation and was found to have gallstones on ultrasound.  Patient further underwent HIDA scan for evaluation for cholecystitis and was found to have no uptake in the gallbladder.  Patient also had bilirubin increased from 0.5-1.1.  LFTs were within normal limit otherwise.  Patient had no leukocytosis.  General surgery was consulted for further evaluation and management.  Patient had a previous remote open appendectomy and lower midline incision for hysterectomy.  Past Medical History:  Diagnosis Date  . Allergy   . Arthritis   . Cataract   . Chronic bronchitis ([place])   . Complication of anesthesia    hard to wake up  . Diverticulosis   . Environmental allergies   . GERD (gastroesophageal reflux disease)   . Hiatal hernia   . History of chicken pox   . Hyperlipidemia   . Hypothyroidism   . IBS (irritable bowel syndrome)   . Migraines   . Mitral valve prolapse   . Rectal prolapse     Past Surgical History:  Procedure Laterality Date  . ABDOMINAL HYSTERECTOMY  1999  . APPENDECTOMY  1962  . BREAST CYST ASPIRATION Bilateral   . EYE SURGERY    . JOINT REPLACEMENT     rt knee scope  . REFRACTIVE SURGERY  2000   both eyes  . TOTAL KNEE ARTHROPLASTY Right 03/24/2015   Procedure: TOTAL KNEE ARTHROPLASTY;  Surgeon: [name], MD;  Location: [facility];  Service: Orthopedics;  Laterality: Right;  . TUBAL LIGATION  1977    Family History  Problem Relation Age of Onset  . Stomach cancer Maternal Grandmother   . Hypertension  Maternal Grandmother   . Diabetes Maternal Grandmother   . Stroke Maternal Grandmother   . Cancer Maternal Grandmother        Stomach cancer  . Colon cancer Paternal Grandfather   . Heart attack Paternal Grandfather   . Hyperlipidemia Mother   . Diabetes Mother   . Hypertension Mother   . Alzheimer's disease Mother   . Diabetes Father   . Hyperlipidemia Father   . Heart disease Father   . Heart attack Father   . Stroke Paternal Grandmother     Social History:  reports that she quit smoking about 42 years ago. Her smoking use included cigarettes. She has never used smokeless tobacco. She reports that she does not drink alcohol or use drugs.  Allergies:  Allergies  Allergen Reactions  . Tetracycline Hives and Itching  . Dilaudid [Hydromorphone Hcl] Nausea And Vomiting    Medications: I have reviewed the patient's current medications.  Results for orders placed or performed during the hospital encounter of 12/14/18 (from the past 48 hour(s))  Basic metabolic panel     Status: Abnormal   Collection Time: 12/15/18  4:53 AM  Result Value Ref Range   Sodium 140 135 - 145 mmol/L   Potassium 3.2 (L) 3.5 - 5.1 mmol/L   Chloride 109 98 - 111 mmol/L   CO2 22 22 - 32 mmol/L   Glucose, Bld  93 70 - 99 mg/dL   BUN 9 8 - 23 mg/dL   Creatinine, Ser 0.90 0.44 - 1.00 mg/dL   Calcium 8.2 (L) 8.9 - 10.3 mg/dL   GFR calc non Af Amer >60 >60 mL/min   GFR calc Af Amer >60 >60 mL/min   Anion gap 9 5 - 15    Comment: Performed at [facility] [address], [place], [place] [address]  CBC     Status: Abnormal   Collection Time: 12/15/18  4:53 AM  Result Value Ref Range   WBC 8.7 4.0 - 10.5 K/uL   RBC 3.74 (L) 3.87 - 5.11 MIL/uL   Hemoglobin 12.2 12.0 - 15.0 g/dL   HCT 35.5 (L) 36.0 - 46.0 %   MCV 94.9 80.0 - 100.0 fL   MCH 32.6 26.0 - 34.0 pg   MCHC 34.4 30.0 - 36.0 g/dL   RDW 13.7 11.5 - 15.5 %   Platelets 245 150 - 400 K/uL    Comment: REPEATED TO VERIFY PLATELET COUNT  CONFIRMED BY SMEAR SPECIMEN CHECKED FOR CLOTS    nRBC 0.0 0.0 - 0.2 %    Comment: Performed at [facility], [place] [address], [place], [place] [address]  Hepatic function panel     Status: Abnormal   Collection Time: 12/15/18  4:53 AM  Result Value Ref Range   Total Protein 5.1 (L) 6.5 - 8.1 g/dL   Albumin 2.8 (L) 3.5 - 5.0 g/dL   AST 19 15 - 41 U/L   ALT 12 0 - 44 U/L   Alkaline Phosphatase 52 38 - 126 U/L   Total Bilirubin 1.2 0.3 - 1.2 mg/dL   Bilirubin, Direct 0.3 (H) 0.0 - 0.2 mg/dL   Indirect Bilirubin 0.9 0.3 - 0.9 mg/dL    Comment: Performed at [facility] [address] [place], [place] [address]  Urinalysis, Routine w reflex microscopic     Status: Abnormal   Collection Time: 12/15/18  4:09 PM  Result Value Ref Range   Color, Urine YELLOW YELLOW   APPearance CLEAR CLEAR   Specific Gravity, Urine 1.004 (L) 1.005 - 1.030   pH 6.0 5.0 - 8.0   Glucose, UA NEGATIVE NEGATIVE mg/dL   Hgb urine dipstick NEGATIVE NEGATIVE   Bilirubin Urine NEGATIVE NEGATIVE   Ketones, ur NEGATIVE NEGATIVE mg/dL   Protein, ur NEGATIVE NEGATIVE mg/dL   Nitrite NEGATIVE NEGATIVE   Leukocytes,Ua NEGATIVE NEGATIVE    Comment: Performed at [facility] [address], [place], [place] [address]    Nm Hepatobiliary Liver Func  Result Date: 12/16/2018 CLINICAL DATA:  Cholelithiasis. Evaluate for cystic duct patency and gallbladder ejection fraction EXAM: NUCLEAR MEDICINE HEPATOBILIARY IMAGING TECHNIQUE: Sequential images of the abdomen were obtained out to 60 minutes following intravenous administration of radiopharmaceutical. RADIOPHARMACEUTICALS:  5.5 mCi Tc-83m  Choletec IV COMPARISON:  Ultrasound exam in CT scan from 12/14/2018. FINDINGS: There is brisk uptake of radiotracer by the hepatic parenchyma with clearance of blood pool activity by 5 minutes period biliary activity is seen at 5 minutes with gut activity clearly visible at 10 minutes. After 90 minutes of observation, no  gallbladder filling was observed. After 90 minutes of imaging, patient was administered 2.5 mg IV morphine sulfate and planar imaging performed for additional 30 minutes after morphine administration. No gallbladder filling observed after administration of IV morphine. IMPRESSION: 1. Normal hepatobiliary patency study. 2. No gallbladder filling after IV morphine sulfate administration. This suggests occlusion  of the cystic duct and is highly concerning for acute cholecystitis. Electronically Signed   By: Misty Stanley M.D.   On: 12/16/2018 12:34    Review of Systems  Constitutional: Negative for chills, fever and malaise/fatigue.  HENT: Negative for ear discharge, hearing loss and sore throat.   Eyes: Negative for blurred vision and discharge.  Respiratory: Negative for cough and shortness of breath.   Cardiovascular: Negative for chest pain, orthopnea and leg swelling.  Gastrointestinal: Positive for abdominal pain and vomiting. Negative for constipation, diarrhea, heartburn and nausea.  Musculoskeletal: Negative for myalgias and neck pain.  Skin: Negative for itching and rash.  Neurological: Negative for dizziness, focal weakness, seizures and loss of consciousness.  Endo/Heme/Allergies: Negative for environmental allergies. Does not bruise/bleed easily.  Psychiatric/Behavioral: Negative for depression and suicidal ideas.  All other systems reviewed and are negative.  Blood pressure 123/69, pulse 80, temperature 99.2 F (37.3 C), temperature source Oral, resp. rate 18, height 5\' 4"  (1.626 m), weight 67.6 kg, SpO2 96 %. Physical Exam  Constitutional: She is oriented to person, place, and time. Vital signs are normal. She appears well-developed and well-nourished.  Conversant No acute distress  Eyes: Lids are normal. No scleral icterus.  No lid lag Moist conjunctiva  Neck: No tracheal tenderness present. No thyromegaly present.  No cervical lymphadenopathy  Cardiovascular: Normal rate,  regular rhythm and intact distal pulses.  No murmur heard. Respiratory: Effort normal and breath sounds normal. She has no wheezes. She has no rales.  GI: Soft. Normal appearance. There is no hepatosplenomegaly. There is abdominal tenderness in the right upper quadrant. There is no rigidity, no rebound and no guarding. No hernia.  Neurological: She is alert and oriented to person, place, and time.  Normal gait and station  Skin: Skin is warm. No rash noted. No cyanosis. Nails show no clubbing.  Normal skin turgor  Psychiatric: Judgment normal.  Appropriate affect    Assessment/Plan: 68 year old female with acute cholecystitis Hypothyroidism Hypertension Hyperlipidemia  1.  We will plan to make her n.p.o. after midnight and plan for lap chole +/- IOC Monday by Dr. Kae Heller 2.All risks and benefits were discussed with the patient to generally include: infection, bleeding, possible need for post op ERCP, damage to the bile ducts, and bile leak. Alternatives were offered and described.  All questions were answered and the patient voiced understanding of the procedure and wishes to proceed at this point with a laparoscopic cholecystectomy   Ralene Ok 12/16/2018, 3:07 PM

## 2018-12-16 NOTE — Progress Notes (Signed)
Progress Note    Megan Valentine  [id number] DOB: 22-Jan-1951  DOA: 12/14/2018 PCP: Libby Maw, MD    Brief Narrative:     Medical records reviewed and are as summarized below:  Megan Valentine is an 68 y.o. female with medical history significant of hypothyroidism; HLD; CKD; and IBS presenting with abdominal pain.  She reports n/v last night about 11pm.  Severe right sided pain.  Recurrent n/v.  She felt well yesterday, walked 2.5 miles without difficulty.  She had chili for dinner with chow chow on it.  Denies fever.  She had COVID infection about 6 weeks ago and no further symptoms.  Last emesis was about 3AM.  She reports that the pain radiates around her right side into her back.  HIDA suggestive of acute cholecystitis   Assessment/Plan:   Principal Problem:   Abdominal pain Active Problems:   Hypothyroidism   Hyperlipidemia   Essential hypertension   CKD (chronic kidney disease) stage 3, GFR 30-59 ml/min   Abdominal pain due to suspected acute cholecystitis -Patient presenting with acute onset of  abdominal pain with radiation from the RUQ and back and n/v  after eating chili -lipase negative -not in the position one would expect for ulcer disease -Work-up generally unremarkable including normal CBC and normal LFTs -Abd CT with gallstones and mild CBD dilation -RUQ [place] non-diagnostic - HIDA suggestive of acute cholecystitis  -IVF -Pain control with morphine -Nausea control with Zofran -Protonix IV -get U/A to r/o blood/ascneding infection (not in a typical location for pyelo)  HTN -She does not appear to be on medications for this issue at this time  HLD -She does not appear to be on medications for this issue at this time -LDL was 117 in 9/19 -Outpatient f/u  Stage 3a CKD -Appears to be stable -Followed by nephrology as an outpatient  Hypokalemia -repleted in IVF as not taking in much PO -recheck in AM  Hypothyroidism -Normal TSH in  8/20 -Continue Synthroid at current dose for now  Hyperglycemia -May be stress response  H/o COVID-19 infection -Patient with no current symptoms associated with recent infection -Her test was positive on 10/8 -Since COVID patients can have a positive test for up to 90 days post-infection without being infectious, there does not appear to be an indication for repeat testing at this time   Family Communication/Anticipated D/C date and plan/Code Status   DVT prophylaxis: Lovenox ordered. Code Status: Full Code.  Family Communication: spouse at bedside 11/21 Disposition Plan: pending plan per GS   Medical Consultants:    GS.    Subjective:   Still nauseous  Objective:    Vitals:   12/15/18 1534 12/15/18 2319 12/16/18 0518 12/16/18 1136  BP: (!) 98/59 (!) 98/56 102/64 123/69  Pulse: 70 82 70 80  Resp:  15 16 18   Temp:  99.2 F (37.3 C) 98.9 F (37.2 C) 99.2 F (37.3 C)  TempSrc:  Oral Oral Oral  SpO2:  96% 96%   Weight:      Height:        Intake/Output Summary (Last 24 hours) at 12/16/2018 1322 Last data filed at 12/16/2018 1144 Gross per 24 hour  Intake 3068.5 ml  Output 300 ml  Net 2768.5 ml   Filed Weights   12/14/18 0334  Weight: 67.6 kg    Exam: In bed Tearful at times rrr No increased work of breathing Today has pain in RUQ with palpation  Data Reviewed:  I have personally reviewed following labs and imaging studies:  Labs: Labs show the following:   Basic Metabolic Panel: Recent Labs  Lab 12/14/18 0334 12/15/18 0453  NA 141 140  K 3.5 3.2*  CL 110 109  CO2 22 22  GLUCOSE 155* 93  BUN 19 9  CREATININE 1.08* 0.90  CALCIUM 9.5 8.2*   GFR Estimated Creatinine Clearance: 56.6 mL/min (by C-G formula based on SCr of 0.9 mg/dL). Liver Function Tests: Recent Labs  Lab 12/14/18 0334 12/15/18 0453  AST 16 19  ALT 15 12  ALKPHOS 63 52  BILITOT 0.5 1.2  PROT 7.1 5.1*  ALBUMIN 4.1 2.8*   Recent Labs  Lab 12/14/18 0334    LIPASE 22   No results for input(s): AMMONIA in the last 168 hours. Coagulation profile No results for input(s): INR, PROTIME in the last 168 hours.  CBC: Recent Labs  Lab 12/14/18 0334 12/15/18 0453  WBC 9.0 8.7  NEUTROABS 6.9  --   HGB 14.0 12.2  HCT 42.0 35.5*  MCV 94.6 94.9  PLT 354 245   Cardiac Enzymes: No results for input(s): CKTOTAL, CKMB, CKMBINDEX, TROPONINI in the last 168 hours. BNP (last 3 results) No results for input(s): PROBNP in the last 8760 hours. CBG: No results for input(s): GLUCAP in the last 168 hours. D-Dimer: No results for input(s): DDIMER in the last 72 hours. Hgb A1c: No results for input(s): HGBA1C in the last 72 hours. Lipid Profile: No results for input(s): CHOL, HDL, LDLCALC, TRIG, CHOLHDL, LDLDIRECT in the last 72 hours. Thyroid function studies: No results for input(s): TSH, T4TOTAL, T3FREE, THYROIDAB in the last 72 hours.  Invalid input(s): FREET3 Anemia work up: No results for input(s): VITAMINB12, FOLATE, FERRITIN, TIBC, IRON, RETICCTPCT in the last 72 hours. Sepsis Labs: Recent Labs  Lab 12/14/18 0334 12/15/18 0453  WBC 9.0 8.7    Microbiology No results found for this or any previous visit (from the past 240 hour(s)).  Procedures and diagnostic studies:  Nm Hepatobiliary Liver Func  Result Date: 12/16/2018 CLINICAL DATA:  Cholelithiasis. Evaluate for cystic duct patency and gallbladder ejection fraction EXAM: NUCLEAR MEDICINE HEPATOBILIARY IMAGING TECHNIQUE: Sequential images of the abdomen were obtained out to 60 minutes following intravenous administration of radiopharmaceutical. RADIOPHARMACEUTICALS:  5.5 mCi Tc-51m  Choletec IV COMPARISON:  Ultrasound exam in CT scan from 12/14/2018. FINDINGS: There is brisk uptake of radiotracer by the hepatic parenchyma with clearance of blood pool activity by 5 minutes period biliary activity is seen at 5 minutes with gut activity clearly visible at 10 minutes. After 90 minutes of  observation, no gallbladder filling was observed. After 90 minutes of imaging, patient was administered 2.5 mg IV morphine sulfate and planar imaging performed for additional 30 minutes after morphine administration. No gallbladder filling observed after administration of IV morphine. IMPRESSION: 1. Normal hepatobiliary patency study. 2. No gallbladder filling after IV morphine sulfate administration. This suggests occlusion of the cystic duct and is highly concerning for acute cholecystitis. Electronically Signed   By: Misty Stanley M.D.   On: 12/16/2018 12:34    Medications:    aspirin EC  81 mg Oral Daily   enoxaparin (LOVENOX) injection  40 mg Subcutaneous Daily   estradiol  0.5 mg Oral Daily   fluticasone  1 spray Each Nare Daily   levothyroxine  100 mcg Oral Q0600   pantoprazole (PROTONIX) IV  40 mg Intravenous Q12H   Continuous Infusions:  0.9 % NaCl with KCl 40 mEq / L  75 mL/hr (12/16/18 0230)     LOS: 1 day   Geradine Girt  [facility]   How to contact the [facility] Attending or Consulting provider [place] or covering provider during after hours [place], for this patient?  1. Check the care team in [facility] and look for a) attending/consulting TRH provider listed and b) the [facility] team listed 2. Log into [email or web address] and use [place]'s universal password to access. If you do not have the password, please contact the hospital operator. 3. Locate the [facility] provider you are looking for under [facility] and page to a number that you can be directly reached. 4. If you still have difficulty reaching the provider, please page the [facility] (Director on Call) for the Hospitalists listed on amion for assistance.  12/16/2018, 1:22 PM

## 2018-12-17 ENCOUNTER — Encounter (HOSPITAL_COMMUNITY)
Admission: EM | Disposition: A | Discharge status: Home / Self Care | Payer: Self-pay | Source: Home / Self Care | Attending: Internal Medicine

## 2018-12-17 ENCOUNTER — Inpatient Hospital Stay (HOSPITAL_COMMUNITY): Payer: PPO | Admitting: Anesthesiology

## 2018-12-17 DIAGNOSIS — K810 Acute cholecystitis: Secondary | ICD-10-CM

## 2018-12-17 HISTORY — PX: CHOLECYSTECTOMY: SHX55

## 2018-12-17 LAB — CBC
HCT: 31.9 % — ABNORMAL LOW (ref 36.0–46.0)
Hemoglobin: 10.4 g/dL — ABNORMAL LOW (ref 12.0–15.0)
MCH: 31.8 pg (ref 26.0–34.0)
MCHC: 32.6 g/dL (ref 30.0–36.0)
MCV: 97.6 fL (ref 80.0–100.0)
Platelets: 257 10*3/uL (ref 150–400)
RBC: 3.27 MIL/uL — ABNORMAL LOW (ref 3.87–5.11)
RDW: 13.1 % (ref 11.5–15.5)
WBC: 6.4 10*3/uL (ref 4.0–10.5)
nRBC: 0 % (ref 0.0–0.2)

## 2018-12-17 LAB — COMPREHENSIVE METABOLIC PANEL
ALT: 13 U/L (ref 0–44)
AST: 13 U/L — ABNORMAL LOW (ref 15–41)
Albumin: 2.6 g/dL — ABNORMAL LOW (ref 3.5–5.0)
Alkaline Phosphatase: 47 U/L (ref 38–126)
Anion gap: 5 (ref 5–15)
BUN: 5 mg/dL — ABNORMAL LOW (ref 8–23)
CO2: 23 mmol/L (ref 22–32)
Calcium: 7.9 mg/dL — ABNORMAL LOW (ref 8.9–10.3)
Chloride: 113 mmol/L — ABNORMAL HIGH (ref 98–111)
Creatinine, Ser: 0.99 mg/dL (ref 0.44–1.00)
GFR calc Af Amer: >60 mL/min (ref >60)
GFR calc non Af Amer: 59 mL/min — ABNORMAL LOW (ref >60)
Glucose, Bld: 103 mg/dL — ABNORMAL HIGH (ref 70–99)
Potassium: 3.5 mmol/L (ref 3.5–5.1)
Sodium: 141 mmol/L (ref 135–145)
Total Bilirubin: 1 mg/dL (ref 0.3–1.2)
Total Protein: 4.9 g/dL — ABNORMAL LOW (ref 6.5–8.1)

## 2018-12-17 SURGERY — LAPAROSCOPIC CHOLECYSTECTOMY WITH INTRAOPERATIVE CHOLANGIOGRAM
Anesthesia: General

## 2018-12-17 MED ORDER — OXYCODONE HCL 5 MG PO TABS: 5.0000 mg | ORAL_TABLET | ORAL | Status: DC | PRN

## 2018-12-17 MED ORDER — ONDANSETRON HCL 4 MG/2ML IJ SOLN
INTRAMUSCULAR | Status: AC
  Filled 2018-12-17: qty 8

## 2018-12-17 MED ORDER — SODIUM CHLORIDE 0.9 % IR SOLN
Status: DC | PRN
  Administered 2018-12-17: 1000 mL

## 2018-12-17 MED ORDER — DEXAMETHASONE SODIUM PHOSPHATE 10 MG/ML IJ SOLN
INTRAMUSCULAR | Status: AC
  Filled 2018-12-17: qty 4

## 2018-12-17 MED ORDER — BUPIVACAINE LIPOSOME 1.3 % IJ SUSP
20.0000 mL | Freq: Once | INTRAMUSCULAR | Status: DC
  Filled 2018-12-17: qty 20

## 2018-12-17 MED ORDER — FENTANYL CITRATE (PF) 100 MCG/2ML IJ SOLN: 25.0000 ug | INTRAMUSCULAR | Status: DC | PRN

## 2018-12-17 MED ORDER — MIDAZOLAM HCL 2 MG/2ML IJ SOLN
INTRAMUSCULAR | Status: AC
  Filled 2018-12-17: qty 2

## 2018-12-17 MED ORDER — 0.9 % SODIUM CHLORIDE (POUR BTL) OPTIME
TOPICAL | Status: DC | PRN
  Administered 2018-12-17: 1000 mL

## 2018-12-17 MED ORDER — LIDOCAINE 2% (20 MG/ML) 5 ML SYRINGE
INTRAMUSCULAR | Status: DC | PRN
  Administered 2018-12-17: 80 mg via INTRAVENOUS

## 2018-12-17 MED ORDER — MIDAZOLAM HCL 2 MG/2ML IJ SOLN
INTRAMUSCULAR | Status: DC | PRN
  Administered 2018-12-17: 2 mg via INTRAVENOUS

## 2018-12-17 MED ORDER — BUPIVACAINE HCL (PF) 0.25 % IJ SOLN
INTRAMUSCULAR | Status: AC
  Filled 2018-12-17: qty 30

## 2018-12-17 MED ORDER — BUPIVACAINE-EPINEPHRINE 0.25% -1:200000 IJ SOLN
INTRAMUSCULAR | Status: DC | PRN
  Administered 2018-12-17: 30 mL

## 2018-12-17 MED ORDER — ONDANSETRON HCL 4 MG/2ML IJ SOLN
INTRAMUSCULAR | Status: DC | PRN
  Administered 2018-12-17: 4 mg via INTRAVENOUS

## 2018-12-17 MED ORDER — FENTANYL CITRATE (PF) 250 MCG/5ML IJ SOLN
INTRAMUSCULAR | Status: AC
  Filled 2018-12-17: qty 5

## 2018-12-17 MED ORDER — SCOPOLAMINE 1 MG/3DAYS TD PT72
MEDICATED_PATCH | TRANSDERMAL | Status: AC
  Filled 2018-12-17: qty 1

## 2018-12-17 MED ORDER — PROPOFOL 10 MG/ML IV BOLUS
INTRAVENOUS | Status: DC | PRN
  Administered 2018-12-17: 160 mg via INTRAVENOUS

## 2018-12-17 MED ORDER — SCOPOLAMINE 1 MG/3DAYS TD PT72
MEDICATED_PATCH | TRANSDERMAL | Status: DC | PRN
  Administered 2018-12-17: 1 via TRANSDERMAL

## 2018-12-17 MED ORDER — ONDANSETRON HCL 4 MG/2ML IJ SOLN: 4.0000 mg | Freq: Once | INTRAMUSCULAR | Status: DC | PRN

## 2018-12-17 MED ORDER — LIDOCAINE 2% (20 MG/ML) 5 ML SYRINGE
INTRAMUSCULAR | Status: AC
  Filled 2018-12-17: qty 20

## 2018-12-17 MED ORDER — ROCURONIUM BROMIDE 10 MG/ML (PF) SYRINGE
PREFILLED_SYRINGE | INTRAVENOUS | Status: AC
  Filled 2018-12-17: qty 40

## 2018-12-17 MED ORDER — LACTATED RINGERS IV SOLN
INTRAVENOUS | Status: DC
  Administered 2018-12-17: 11:00:00 via INTRAVENOUS

## 2018-12-17 MED ORDER — SUCCINYLCHOLINE CHLORIDE 200 MG/10ML IV SOSY
PREFILLED_SYRINGE | INTRAVENOUS | Status: DC | PRN
  Administered 2018-12-17: 100 mg via INTRAVENOUS

## 2018-12-17 MED ORDER — BUPIVACAINE LIPOSOME 1.3 % IJ SUSP
INTRAMUSCULAR | Status: DC | PRN
  Administered 2018-12-17: 50 mL

## 2018-12-17 MED ORDER — ROCURONIUM BROMIDE 10 MG/ML (PF) SYRINGE
PREFILLED_SYRINGE | INTRAVENOUS | Status: DC | PRN
  Administered 2018-12-17: 20 mg via INTRAVENOUS
  Administered 2018-12-17: 40 mg via INTRAVENOUS

## 2018-12-17 MED ORDER — SUGAMMADEX SODIUM 200 MG/2ML IV SOLN
INTRAVENOUS | Status: DC | PRN
  Administered 2018-12-17: 275 mg via INTRAVENOUS

## 2018-12-17 MED ORDER — HEMOSTATIC AGENTS (NO CHARGE) OPTIME
TOPICAL | Status: DC | PRN
  Administered 2018-12-17: 1 via TOPICAL

## 2018-12-17 MED ORDER — FENTANYL CITRATE (PF) 250 MCG/5ML IJ SOLN
INTRAMUSCULAR | Status: DC | PRN
  Administered 2018-12-17 (×3): 50 ug via INTRAVENOUS

## 2018-12-17 MED ORDER — PHENYLEPHRINE 40 MCG/ML (10ML) SYRINGE FOR IV PUSH (FOR BLOOD PRESSURE SUPPORT)
PREFILLED_SYRINGE | INTRAVENOUS | Status: AC
  Filled 2018-12-17: qty 20

## 2018-12-17 MED FILL — Piperacillin Sod-Tazobactam Sod in Dex IV Sol 3-0.375GM/50ML: INTRAVENOUS | Qty: 50 | Status: AC

## 2018-12-17 SURGICAL SUPPLY — 41 items
ADH SKN CLS APL DERMABOND .7 (GAUZE/BANDAGES/DRESSINGS) ×1
APL PRP STRL LF DISP 70% ISPRP (MISCELLANEOUS) ×1
APPLIER CLIP 5 13 M/L LIGAMAX5 (MISCELLANEOUS) ×3
APR CLP MED LRG 5 ANG JAW (MISCELLANEOUS) ×1
BAG SPEC RTRVL LRG 6X4 10 (ENDOMECHANICALS) ×1
CANISTER SUCT 3000ML PPV (MISCELLANEOUS) ×3 IMPLANT
CHLORAPREP W/TINT 26 (MISCELLANEOUS) ×3 IMPLANT
CLIP APPLIE 5 13 M/L LIGAMAX5 (MISCELLANEOUS) ×1 IMPLANT
CONT SPEC 4OZ CLIKSEAL STRL BL (MISCELLANEOUS) ×3 IMPLANT
COVER MAYO STAND STRL (DRAPES) IMPLANT
COVER SURGICAL LIGHT HANDLE (MISCELLANEOUS) ×3 IMPLANT
COVER WAND RF STERILE (DRAPES) ×3 IMPLANT
DERMABOND ADVANCED (GAUZE/BANDAGES/DRESSINGS) ×2
DERMABOND ADVANCED .7 DNX12 (GAUZE/BANDAGES/DRESSINGS) ×1 IMPLANT
DRAPE C-ARM 42X120 X-RAY (DRAPES) IMPLANT
ELECT REM PT RETURN 9FT ADLT (ELECTROSURGICAL) ×3
ELECTRODE REM PT RTRN 9FT ADLT (ELECTROSURGICAL) ×1 IMPLANT
GLOVE BIO SURGEON STRL SZ 6 (GLOVE) ×3 IMPLANT
GLOVE INDICATOR 6.5 STRL GRN (GLOVE) ×3 IMPLANT
GOWN STRL REUS W/ TWL LRG LVL3 (GOWN DISPOSABLE) ×3 IMPLANT
GOWN STRL REUS W/TWL LRG LVL3 (GOWN DISPOSABLE) ×9
GRASPER SUT TROCAR 14GX15 (MISCELLANEOUS) ×3 IMPLANT
KIT BASIN OR (CUSTOM PROCEDURE TRAY) ×3 IMPLANT
KIT TURNOVER KIT B (KITS) ×3 IMPLANT
NDL INSUFFLATION 14GA 120MM (NEEDLE) ×1 IMPLANT
NEEDLE INSUFFLATION 14GA 120MM (NEEDLE) ×3 IMPLANT
NS IRRIG 1000ML POUR BTL (IV SOLUTION) ×3 IMPLANT
PAD ARMBOARD 7.5X6 YLW CONV (MISCELLANEOUS) ×3 IMPLANT
POUCH SPECIMEN RETRIEVAL 10MM (ENDOMECHANICALS) ×3 IMPLANT
SCISSORS LAP 5X35 DISP (ENDOMECHANICALS) ×3 IMPLANT
SET CHOLANGIOGRAPH 5 50 .035 (SET/KITS/TRAYS/PACK) IMPLANT
SET IRRIG TUBING LAPAROSCOPIC (IRRIGATION / IRRIGATOR) ×3 IMPLANT
SET TUBE SMOKE EVAC HIGH FLOW (TUBING) ×3 IMPLANT
SLEEVE ENDOPATH XCEL 5M (ENDOMECHANICALS) ×8 IMPLANT
SUT MNCRL AB 4-0 PS2 18 (SUTURE) ×3 IMPLANT
TOWEL GREEN STERILE (TOWEL DISPOSABLE) ×3 IMPLANT
TOWEL GREEN STERILE FF (TOWEL DISPOSABLE) ×3 IMPLANT
TRAY LAPAROSCOPIC MC (CUSTOM PROCEDURE TRAY) ×3 IMPLANT
TROCAR XCEL NON-BLD 11X100MML (ENDOMECHANICALS) ×3 IMPLANT
TROCAR XCEL NON-BLD 5MMX100MML (ENDOMECHANICALS) ×3 IMPLANT
WATER STERILE IRR 1000ML POUR (IV SOLUTION) ×3 IMPLANT

## 2018-12-17 NOTE — Op Note (Signed)
Operative Note  Megan Valentine 68 y.o. female [id number]  12/17/2018  Surgeon: Clovis Riley MD FACS  Assistant: Jackson Latino PA-C  Procedure performed: Laparoscopic Cholecystectomy  Procedure classification: urgent/ emergent  Preop diagnosis: cholecystitis Post-op diagnosis/intraop findings: acute calculous cholecystitis  Specimens: gallbladder  Retained items: none  EBL: minimal  Complications: none  Description of procedure: After obtaining informed consent the patient was brought to the operating room. Antibiotics were administered. SCD's were applied. General endotracheal anesthesia was initiated and a formal time-out was performed. The abdomen was prepped and draped in the usual sterile fashion and the abdomen was entered using visiport technique in the left upper quadrant after instilling the site with local. Insufflation to 54mmHg was obtained, 28mm trocar and camera inserted, and gross inspection revealed no evidence of injury from our entry or other intraabdominal abnormalities. Three 97mm trocars were introduced in the supraumbilical, right midclavicular and right anterior axillary lines under direct visualization and following infiltration with local. This did require lysis of omental adhesions to the upper right anterior abdominal wall. An 24mm trocar was placed in the epigastrium. The gallbladder was retracted cephalad and the infundibulum was retracted laterally. Omental adhesions to the gallbladder were taken down with cautery and blunt dissection. A combination of hook electrocautery and blunt dissection was utilized to clear the peritoneum from the neck and cystic duct, circumferentially isolating the cystic artery and cystic duct and lifting the gallbladder from the cystic plate. The critical view of safety was achieved with the cystic artery, cystic duct, and liver bed visualized between them with no other structures. The artery was clipped with a single clip proximally  and distally and divided as was the cystic duct with three clips on the proximal end. The gallbladder was dissected from the liver plate using electrocautery. Once freed the gallbladder was placed in an endocatch bag and removed through the epigastric trocar site. A small amount of bleeding on the liver bed was controlled with cautery and surgicel snow. Some bile had been spilled from the gallbladder during its dissection from the liver bed. This was aspirated and the right upper quadrant was irrigated copiously until the effluent was clear. Hemostasis was once again confirmed, and reinspection of the abdomen revealed no injuries. The clips were well opposed without any bile leak from the duct or the liver bed. The 26mm trocar site in the epigastrium was closed with a 0 vicryl in the fascia under direct visualization using a PMI device. The abdomen was desufflated and all trocars removed. The skin incisions were closed with subcuticular monocryl and Dermabond. The patient was awakened, extubated and transported to the recovery room in stable condition.    All counts were correct at the completion of the case.

## 2018-12-17 NOTE — Progress Notes (Signed)
   Subjective/Chief Complaint: Still with RUQ pain and nausea, slightly better than on presentation   Objective: Vital signs in last 24 hours: Temp:  [98.3 F (36.8 C)-99.2 F (37.3 C)] 98.3 F (36.8 C) (11/23 0446) Pulse Rate:  [70-80] 73 (11/23 0446) Resp:  [18] 18 (11/23 0446) BP: (105-123)/(64-84) 105/64 (11/23 0446) SpO2:  [95 %-98 %] 95 % (11/23 0446) Last BM Date: 12/14/18  Intake/Output from previous day: 11/22 0701 - 11/23 0700 In: 2928.6 [P.O.:600; I.V.:2278.3; IV Piggyback:50.3] Out: -  Intake/Output this shift: No intake/output data recorded.  General appearance: alert and well appearing  Lab Results:  Recent Labs    12/15/18 0453 12/17/18 0213  WBC 8.7 6.4  HGB 12.2 10.4*  HCT 35.5* 31.9*  PLT 245 257   BMET Recent Labs    12/15/18 0453 12/17/18 0213  NA 140 141  K 3.2* 3.5  CL 109 113*  CO2 22 23  GLUCOSE 93 103*  BUN 9 5*  CREATININE 0.90 0.99  CALCIUM 8.2* 7.9*   PT/INR No results for input(s): LABPROT, INR in the last 72 hours. ABG No results for input(s): PHART, HCO3 in the last 72 hours.  Invalid input(s): PCO2, PO2  Studies/Results: Nm Hepatobiliary Liver Func  Result Date: 12/16/2018 CLINICAL DATA:  Cholelithiasis. Evaluate for cystic duct patency and gallbladder ejection fraction EXAM: NUCLEAR MEDICINE HEPATOBILIARY IMAGING TECHNIQUE: Sequential images of the abdomen were obtained out to 60 minutes following intravenous administration of radiopharmaceutical. RADIOPHARMACEUTICALS:  5.5 mCi Tc-67m  Choletec IV COMPARISON:  Ultrasound exam in CT scan from 12/14/2018. FINDINGS: There is brisk uptake of radiotracer by the hepatic parenchyma with clearance of blood pool activity by 5 minutes period biliary activity is seen at 5 minutes with gut activity clearly visible at 10 minutes. After 90 minutes of observation, no gallbladder filling was observed. After 90 minutes of imaging, patient was administered 2.5 mg IV morphine sulfate and  planar imaging performed for additional 30 minutes after morphine administration. No gallbladder filling observed after administration of IV morphine. IMPRESSION: 1. Normal hepatobiliary patency study. 2. No gallbladder filling after IV morphine sulfate administration. This suggests occlusion of the cystic duct and is highly concerning for acute cholecystitis. Electronically Signed   By: Misty Stanley M.D.   On: 12/16/2018 12:34    Anti-infectives: Anti-infectives (From admission, onward)   Start     Dose/Rate Route Frequency Ordered Stop   12/16/18 1400  piperacillin-tazobactam (ZOSYN) IVPB 3.375 g     3.375 g 12.5 mL/hr over 240 Minutes Intravenous Every 8 hours 12/16/18 1333 12/17/18 2359      Assessment/Plan: 68 year old female with acute cholecystitis Hypothyroidism Hypertension Hyperlipidemia CKD IBS/ constipation COVID infection 6 weeks ago  I recommend proceeding with laparoscopic cholecystectomy with possible cholangiogram. Discussed risks of surgery including bleeding, pain, scarring, intraabdominal injury specifically to the common bile duct and sequelae, bile leak, conversion to open surgery, blood clot, pneumonia, heart attack, stroke, failure to resolve symptoms, etc. Questions welcomed and answered. OR this afternoon. Possible discharge this evening depending on timing and operative findings.    LOS: 2 days    Megan Valentine 12/17/2018

## 2018-12-17 NOTE — Progress Notes (Signed)
Progress Note    Megan Valentine  [id number] DOB: 1950/02/26  DOA: 12/14/2018 PCP: Libby Maw, MD    Brief Narrative:     Medical records reviewed and are as summarized below:  Megan Valentine is an 68 y.o. female with medical history significant of hypothyroidism; HLD; CKD; and IBS presenting with abdominal pain.  She reports n/v last night about 11pm.  Severe right sided pain.  Recurrent n/v.  She felt well yesterday, walked 2.5 miles without difficulty.  She had chili for dinner with chow chow on it.  Denies fever.  She had COVID infection about 6 weeks ago and no further symptoms.  Last emesis was about 3AM.  She reports that the pain radiates around her right side into her back.  HIDA suggestive of acute cholecystitis   Assessment/Plan:   Principal Problem:   Abdominal pain Active Problems:   Hypothyroidism   Hyperlipidemia   Essential hypertension   CKD (chronic kidney disease) stage 3, GFR 30-59 ml/min   Abdominal pain due to suspected acute cholecystitis -Patient presenting with acute onset of  abdominal pain with radiation from the RUQ and back and n/v  after eating chili -lipase negative -not in the position one would expect for ulcer disease -Work-up generally unremarkable including normal CBC and normal LFTs -Abd CT with gallstones and mild CBD dilation -RUQ [place] non-diagnostic - HIDA suggestive of acute cholecystitis  -IVF -Pain control with morphine -Nausea control with Zofran -general surgery consult appreciated- plan for acute cholecystectomy on 11/23  HTN -She does not appear to be on medications for this issue at this time  HLD -She does not appear to be on medications for this issue at this time -LDL was 117 in 9/19 -Outpatient f/u  Stage 3a CKD -Appears to be stable -Followed by nephrology as an outpatient  Hypokalemia -repleted in IVF as not taking in much PO  Hypothyroidism -Normal TSH in 8/20 -Continue Synthroid at  current dose for now  Hyperglycemia -May be stress response  H/o COVID-19 infection -Patient with no current symptoms associated with recent infection -Her test was positive on 10/8 -Since COVID patients can have a positive test for up to 90 days post-infection without being infectious, there does not appear to be an indication for repeat testing at this time   Family Communication/Anticipated D/C date and plan/Code Status   DVT prophylaxis: Lovenox ordered. Code Status: Full Code.  Family Communication: spouse at bedside 11/21 Disposition Plan: cholecystectomy planned for today   Medical Consultants:    GS.    Subjective:   Going to surgery today  Objective:    Vitals:   12/16/18 1136 12/16/18 1625 12/16/18 2137 12/17/18 0446  BP: 123/69 113/84 109/65 105/64  Pulse: 80 70 77 73  Resp: 18 18 18 18   Temp: 99.2 F (37.3 C) 99 F (37.2 C) 98.7 F (37.1 C) 98.3 F (36.8 C)  TempSrc: Oral Oral Oral Oral  SpO2:  98% 96% 95%  Weight:      Height:        Intake/Output Summary (Last 24 hours) at 12/17/2018 [id number] Last data filed at 12/17/2018 [id number] Gross per 24 hour  Intake 1849.98 ml  Output -  Net 1849.98 ml   Filed Weights   12/14/18 0334  Weight: 67.6 kg    Exam: In bed, NAD rrr No increased work of breathing No LE edema A+OX3  Data Reviewed:   I have personally reviewed following labs and imaging studies:  Labs: Labs show the following:   Basic Metabolic Panel: Recent Labs  Lab 12/14/18 0334 12/15/18 0453 12/17/18 0213  NA 141 140 141  K 3.5 3.2* 3.5  CL 110 109 113*  CO2 22 22 23   GLUCOSE 155* 93 103*  BUN 19 9 5*  CREATININE 1.08* 0.90 0.99  CALCIUM 9.5 8.2* 7.9*   GFR Estimated Creatinine Clearance: 51.4 mL/min (by C-G formula based on SCr of 0.99 mg/dL). Liver Function Tests: Recent Labs  Lab 12/14/18 0334 12/15/18 0453 12/17/18 0213  AST 16 19 13*  ALT 15 12 13   ALKPHOS 63 52 47  BILITOT 0.5 1.2 1.0  PROT 7.1 5.1*  4.9*  ALBUMIN 4.1 2.8* 2.6*   Recent Labs  Lab 12/14/18 0334  LIPASE 22   No results for input(s): AMMONIA in the last 168 hours. Coagulation profile No results for input(s): INR, PROTIME in the last 168 hours.  CBC: Recent Labs  Lab 12/14/18 0334 12/15/18 0453 12/17/18 0213  WBC 9.0 8.7 6.4  NEUTROABS 6.9  --   --   HGB 14.0 12.2 10.4*  HCT 42.0 35.5* 31.9*  MCV 94.6 94.9 97.6  PLT 354 245 257   Cardiac Enzymes: No results for input(s): CKTOTAL, CKMB, CKMBINDEX, TROPONINI in the last 168 hours. BNP (last 3 results) No results for input(s): PROBNP in the last 8760 hours. CBG: No results for input(s): GLUCAP in the last 168 hours. D-Dimer: No results for input(s): DDIMER in the last 72 hours. Hgb A1c: No results for input(s): HGBA1C in the last 72 hours. Lipid Profile: No results for input(s): CHOL, HDL, LDLCALC, TRIG, CHOLHDL, LDLDIRECT in the last 72 hours. Thyroid function studies: No results for input(s): TSH, T4TOTAL, T3FREE, THYROIDAB in the last 72 hours.  Invalid input(s): FREET3 Anemia work up: No results for input(s): VITAMINB12, FOLATE, FERRITIN, TIBC, IRON, RETICCTPCT in the last 72 hours. Sepsis Labs: Recent Labs  Lab 12/14/18 0334 12/15/18 0453 12/17/18 0213  WBC 9.0 8.7 6.4    Microbiology Recent Results (from the past 240 hour(s))  Surgical pcr screen     Status: None   Collection Time: 12/16/18  8:41 PM   Specimen: Nasal Mucosa; Nasal Swab  Result Value Ref Range Status   MRSA, PCR NEGATIVE NEGATIVE Final   Staphylococcus aureus NEGATIVE NEGATIVE Final    Comment: (NOTE) The Xpert SA Assay (FDA approved for NASAL specimens in patients 20 years of age and older), is one component of a comprehensive surveillance program. It is not intended to diagnose infection nor to guide or monitor treatment. Performed at [facility], [place] [address], [place], [place] [address]     Procedures and diagnostic studies:  Nm Hepatobiliary Liver  Func  Result Date: 12/16/2018 CLINICAL DATA:  Cholelithiasis. Evaluate for cystic duct patency and gallbladder ejection fraction EXAM: NUCLEAR MEDICINE HEPATOBILIARY IMAGING TECHNIQUE: Sequential images of the abdomen were obtained out to 60 minutes following intravenous administration of radiopharmaceutical. RADIOPHARMACEUTICALS:  5.5 mCi Tc-31m  Choletec IV COMPARISON:  Ultrasound exam in CT scan from 12/14/2018. FINDINGS: There is brisk uptake of radiotracer by the hepatic parenchyma with clearance of blood pool activity by 5 minutes period biliary activity is seen at 5 minutes with gut activity clearly visible at 10 minutes. After 90 minutes of observation, no gallbladder filling was observed. After 90 minutes of imaging, patient was administered 2.5 mg IV morphine sulfate and planar imaging performed for additional 30 minutes after morphine administration. No gallbladder filling observed after administration of IV  morphine. IMPRESSION: 1. Normal hepatobiliary patency study. 2. No gallbladder filling after IV morphine sulfate administration. This suggests occlusion of the cystic duct and is highly concerning for acute cholecystitis. Electronically Signed   By: Misty Stanley M.D.   On: 12/16/2018 12:34    Medications:   . acetaminophen  1,000 mg Oral On Call to OR  . aspirin EC  81 mg Oral Daily  . Chlorhexidine Gluconate Cloth  6 each Topical Once  . dexamethasone (DECADRON) injection  4 mg Intravenous On Call to OR  . enoxaparin (LOVENOX) injection  40 mg Subcutaneous Daily  . estradiol  0.5 mg Oral Daily  . fluticasone  1 spray Each Nare Daily  . levothyroxine  100 mcg Oral Q0600  . pantoprazole (PROTONIX) IV  40 mg Intravenous Q12H   Continuous Infusions: . 0.9 % NaCl with KCl 40 mEq / L 75 mL/hr (12/17/18 [id number])  . piperacillin-tazobactam (ZOSYN)  IV 3.375 g (12/17/18 0153)     LOS: 2 days   Geradine Girt  [facility]   How to contact the [facility] Attending or Consulting  provider [place] or covering provider during after hours [place], for this patient?  1. Check the care team in [facility] and look for a) attending/consulting TRH provider listed and b) the [facility] team listed 2. Log into [email or web address] and use [place]'s universal password to access. If you do not have the password, please contact the hospital operator. 3. Locate the [facility] provider you are looking for under [facility] and page to a number that you can be directly reached. 4. If you still have difficulty reaching the provider, please page the [facility] (Director on Call) for the Hospitalists listed on amion for assistance.  12/17/2018, 8:07 AM

## 2018-12-17 NOTE — Anesthesia Procedure Notes (Signed)
Procedure Name: Intubation Date/Time: 12/17/2018 11:15 AM Performed by: Clearnce Sorrel, CRNA Pre-anesthesia Checklist: Patient identified, Emergency Drugs available, Suction available, Patient being monitored and Timeout performed Patient Re-evaluated:Patient Re-evaluated prior to induction Oxygen Delivery Method: Circle system utilized Preoxygenation: Pre-oxygenation with 100% oxygen Induction Type: IV induction, Rapid sequence and Cricoid Pressure applied Laryngoscope Size: Mac and 3 Grade View: Grade I Tube type: Oral Tube size: 7.0 mm Number of attempts: 2 Airway Equipment and Method: Stylet Placement Confirmation: ETT inserted through vocal cords under direct vision,  positive ETCO2 and breath sounds checked- equal and bilateral Secured at: 21 cm Tube secured with: Tape Dental Injury: Teeth and Oropharynx as per pre-operative assessment

## 2018-12-17 NOTE — Discharge Instructions (Signed)
[facility] LAPAROSCOPIC SURGERY: POST OP INSTRUCTIONS Always review your discharge instruction sheet given to you by the facility where your surgery was performed. IF YOU HAVE DISABILITY OR FAMILY LEAVE FORMS, YOU MUST BRING THEM TO THE OFFICE FOR PROCESSING.   DO NOT GIVE THEM TO YOUR DOCTOR.  PAIN CONTROL  1. First take acetaminophen (Tylenol) AND/or ibuprofen (Advil) to control your pain after surgery.  Follow directions on package.  Taking acetaminophen (Tylenol) and/or ibuprofen (Advil) regularly after surgery will help to control your pain and lower the amount of prescription pain medication you may need.  You should not take more than 3,000 mg (3 grams) of acetaminophen (Tylenol) in 24 hours.  You should not take ibuprofen (Advil), aleve, motrin, naprosyn or other NSAIDS if you have a history of stomach ulcers or chronic kidney disease.  2. A prescription for pain medication may be given to you upon discharge.  Take your pain medication as prescribed, if you still have uncontrolled pain after taking acetaminophen (Tylenol) or ibuprofen (Advil). 3. Use ice packs to help control pain. 4. If you need a refill on your pain medication, please contact your pharmacy.  They will contact our office to request authorization. Prescriptions will not be filled after 5pm or on week-ends.  HOME MEDICATIONS 5. Take your usually prescribed medications unless otherwise directed.  DIET 6. You should follow a light diet the first few days after arrival home.  Be sure to include lots of fluids daily. Avoid fatty, fried foods.   CONSTIPATION 7. It is common to experience some constipation after surgery and if you are taking pain medication.  Increasing fluid intake and taking a stool softener (such as Colace) will usually help or prevent this problem from occurring.  A mild laxative (Milk of Magnesia or Miralax) should be taken according to package instructions if there are no bowel  movements after 48 hours.  WOUND/INCISION CARE 8. Most patients will experience some swelling and bruising in the area of the incisions.  Ice packs will help.  Swelling and bruising can take several days to resolve.  9. Unless discharge instructions indicate otherwise, follow guidelines below  a. STERI-STRIPS - you may remove your outer bandages 48 hours after surgery, and you may shower at that time.  You have steri-strips (small skin tapes) in place directly over the incision.  These strips should be left on the skin for 7-10 days.   b. DERMABOND/SKIN GLUE - you may shower in 24 hours.  The glue will flake off over the next 2-3 weeks. 10. Any sutures or staples will be removed at the office during your follow-up visit.  ACTIVITIES 11. You may resume regular (light) daily activities beginning the next day--such as daily self-care, walking, climbing stairs--gradually increasing activities as tolerated.  You may have sexual intercourse when it is comfortable.  Refrain from any heavy lifting or straining until approved by your doctor. a. You may drive when you are no longer taking prescription pain medication, you can comfortably wear a seatbelt, and you can safely maneuver your car and apply brakes.  FOLLOW-UP 12. You should see your doctor in the office for a follow-up appointment approximately 2-3 weeks after your surgery.  You should have been given your post-op/follow-up appointment when your surgery was scheduled.  If you did not receive a post-op/follow-up appointment, make sure that you call for this appointment within a day or two after you arrive home to insure a convenient appointment time.     WHEN TO CALL YOUR DOCTOR: 1. Fever over 101.0 2. Inability to urinate 3. Continued bleeding from incision. 4. Increased pain, redness, or drainage from the incision. 5. Increasing abdominal pain  The clinic staff is available to answer your questions during regular business hours.  Please don't  hesitate to call and ask to speak to one of the nurses for clinical concerns.  If you have a medical emergency, go to the nearest emergency room or call 911.  A surgeon from [facility] is always on call at the hospital. [address], [address], [place], [place]  [address] ? [address] [address], , [place]   [address] [phone] ? [phone] ? FAX [phone] Web site: [email or web address]  .........   Managing Your Pain After Surgery Without Opioids    Thank you for participating in our program to help patients manage their pain after surgery without opioids. This is part of our effort to provide you with the best care possible, without exposing you or your family to the risk that opioids pose.  What pain can I expect after surgery? You can expect to have some pain after surgery. This is normal. The pain is typically worse the day after surgery, and quickly begins to get better. Many studies have found that many patients are able to manage their pain after surgery with Over-the-Counter (OTC) medications such as Tylenol and Motrin. If you have a condition that does not allow you to take Tylenol or Motrin, notify your surgical team.  How will I manage my pain? The best strategy for controlling your pain after surgery is around the clock pain control with Tylenol (acetaminophen) and Motrin (ibuprofen or Advil). Alternating these medications with each other allows you to maximize your pain control. In addition to Tylenol and Motrin, you can use heating pads or ice packs on your incisions to help reduce your pain.  How will I alternate your regular strength over-the-counter pain medication? You will take a dose of pain medication every three hours. ; Start by taking 650 mg of Tylenol (2 pills of 325 mg) ; 3 hours later take 600 mg of Motrin (3 pills of 200 mg) ; 3 hours after taking the Motrin take 650 mg of Tylenol ; 3 hours after that take 600 mg of  Motrin.   - 1 -  See example - if your first dose of Tylenol is at 12:00 PM   12:00 PM Tylenol 650 mg (2 pills of 325 mg)  3:00 PM Motrin 600 mg (3 pills of 200 mg)  6:00 PM Tylenol 650 mg (2 pills of 325 mg)  9:00 PM Motrin 600 mg (3 pills of 200 mg)  Continue alternating every 3 hours   We recommend that you follow this schedule around-the-clock for at least 3 days after surgery, or until you feel that it is no longer needed. Use the table on the last page of this handout to keep track of the medications you are taking. Important: Do not take more than 3000mg of Tylenol or 3200mg of Motrin in a 24-hour period. Do not take ibuprofen/Motrin if you have a history of bleeding stomach ulcers, severe kidney disease, &/or actively taking a blood thinner  What if I still have pain? If you have pain that is not controlled with the over-the-counter pain medications (Tylenol and Motrin or Advil) you might have what we call "breakthrough" pain. You will receive a prescription for a small amount of an opioid pain medication such as   Oxycodone, Tramadol, or Tylenol with Codeine. Use these opioid pills in the first 24 hours after surgery if you have breakthrough pain. Do not take more than 1 pill every 4-6 hours.  If you still have uncontrolled pain after using all opioid pills, don't hesitate to call our staff using the number provided. We will help make sure you are managing your pain in the best way possible, and if necessary, we can provide a prescription for additional pain medication.   Day 1    Time  Name of Medication Number of pills taken  Amount of Acetaminophen  Pain Level   Comments  AM PM       AM PM       AM PM       AM PM       AM PM       AM PM       AM PM       AM PM       Total Daily amount of Acetaminophen Do not take more than  3,000 mg per day      Day 2    Time  Name of Medication Number of pills taken  Amount of Acetaminophen  Pain Level   Comments  AM  PM       AM PM       AM PM       AM PM       AM PM       AM PM       AM PM       AM PM       Total Daily amount of Acetaminophen Do not take more than  3,000 mg per day      Day 3    Time  Name of Medication Number of pills taken  Amount of Acetaminophen  Pain Level   Comments  AM PM       AM PM       AM PM       AM PM          AM PM       AM PM       AM PM       AM PM       Total Daily amount of Acetaminophen Do not take more than  3,000 mg per day      Day 4    Time  Name of Medication Number of pills taken  Amount of Acetaminophen  Pain Level   Comments  AM PM       AM PM       AM PM       AM PM       AM PM       AM PM       AM PM       AM PM       Total Daily amount of Acetaminophen Do not take more than  3,000 mg per day      Day 5    Time  Name of Medication Number of pills taken  Amount of Acetaminophen  Pain Level   Comments  AM PM       AM PM       AM PM       AM PM       AM PM       AM PM       AM PM         AM PM       Total Daily amount of Acetaminophen Do not take more than  3,000 mg per day       Day 6    Time  Name of Medication Number of pills taken  Amount of Acetaminophen  Pain Level  Comments  AM PM       AM PM       AM PM       AM PM       AM PM       AM PM       AM PM       AM PM       Total Daily amount of Acetaminophen Do not take more than  3,000 mg per day      Day 7    Time  Name of Medication Number of pills taken  Amount of Acetaminophen  Pain Level   Comments  AM PM       AM PM       AM PM       AM PM       AM PM       AM PM       AM PM       AM PM       Total Daily amount of Acetaminophen Do not take more than  3,000 mg per day        For additional information about how and where to safely dispose of unused opioid medications - https://www.morepowerful[employer].org  Disclaimer: This document contains information and/or instructional materials adapted from [facility]  for the typical patient with your condition. It does not replace medical advice from your health care provider because your experience may differ from that of the typical patient. Talk to your health care provider if you have any questions about this document, your condition or your treatment plan. Adapted from [facility]  

## 2018-12-17 NOTE — Anesthesia Preprocedure Evaluation (Addendum)
Anesthesia Evaluation  Patient identified by MRN, date of birth, ID band Patient awake    Reviewed: Allergy & Precautions, NPO status , Patient's Chart, lab work & pertinent test results  History of Anesthesia Complications (+) PONV, PROLONGED EMERGENCE and history of anesthetic complications  Airway Mallampati: II  TM Distance: >3 FB Neck ROM: Full    Dental  (+) Dental Advisory Given   Pulmonary former smoker,    Pulmonary exam normal        Cardiovascular hypertension (no meds x several months), Normal cardiovascular exam+ Valvular Problems/Murmurs MVP      Neuro/Psych  Headaches, negative psych ROS   GI/Hepatic Neg liver ROS, hiatal hernia, GERD  Medicated and Poorly Controlled, IBS    Endo/Other  Hypothyroidism   Renal/GU CRFRenal disease     Musculoskeletal  (+) Arthritis ,   Abdominal   Peds  Hematology  (+) anemia ,   Anesthesia Other Findings Covid+ 11/01/18   Reproductive/Obstetrics                            Anesthesia Physical Anesthesia Plan  ASA: II  Anesthesia Plan: General   Post-op Pain Management:    Induction: Intravenous, Rapid sequence and Cricoid pressure planned  PONV Risk Score and Plan: 4 or greater and Treatment may vary due to age or medical condition, Ondansetron, Dexamethasone, Propofol infusion and Scopolamine patch - Pre-op  Airway Management Planned: Oral ETT  Additional Equipment: None  Intra-op Plan:   Post-operative Plan: Extubation in OR  Informed Consent: I have reviewed the patients History and Physical, chart, labs and discussed the procedure including the risks, benefits and alternatives for the proposed anesthesia with the patient or authorized representative who has indicated his/her understanding and acceptance.     Dental advisory given  Plan Discussed with: CRNA and Anesthesiologist  Anesthesia Plan Comments:         Anesthesia Quick Evaluation

## 2018-12-17 NOTE — Transfer of Care (Signed)
Immediate Anesthesia Transfer of Care Note  Patient: Megan Valentine  Procedure(s) Performed: LAPAROSCOPIC CHOLECYSTECTOMY (N/A )  Patient Location: PACU  Anesthesia Type:General  Level of Consciousness: awake, alert  and oriented  Airway & Oxygen Therapy: Patient Spontanous Breathing  Post-op Assessment: Report given to RN and Post -op Vital signs reviewed and stable  Post vital signs: Reviewed and stable  Last Vitals:  Vitals Value Taken Time  BP 114/99 12/17/18 1225  Temp    Pulse 74 12/17/18 1226  Resp 15 12/17/18 1226  SpO2 98 % 12/17/18 1226  Vitals shown include unvalidated device data.  Last Pain:  Vitals:   12/17/18 0825  TempSrc:   PainSc: 3       Patients Stated Pain Goal: 4 ([id number])  Complications: No apparent anesthesia complications

## 2018-12-17 NOTE — Anesthesia Postprocedure Evaluation (Signed)
Anesthesia Post Note  Patient: Megan Valentine  Procedure(s) Performed: LAPAROSCOPIC CHOLECYSTECTOMY (N/A )     Patient location during evaluation: PACU Anesthesia Type: General Level of consciousness: awake and alert Pain management: pain level controlled Vital Signs Assessment: post-procedure vital signs reviewed and stable Respiratory status: spontaneous breathing, nonlabored ventilation and respiratory function stable Cardiovascular status: blood pressure returned to baseline and stable Postop Assessment: no apparent nausea or vomiting Anesthetic complications: no    Last Vitals:  Vitals:   12/17/18 1309 12/17/18 1803  BP: 121/69 120/71  Pulse:  73  Resp: 18 16  Temp: 36.7 C 36.8 C  SpO2: 98% 96%    Last Pain:  Vitals:   12/17/18 1803  TempSrc: Oral  PainSc:                  [name]

## 2018-12-18 ENCOUNTER — Encounter (HOSPITAL_COMMUNITY): Payer: Self-pay | Admitting: Surgery

## 2018-12-18 DIAGNOSIS — R109 Unspecified abdominal pain: Secondary | ICD-10-CM

## 2018-12-18 DIAGNOSIS — K810 Acute cholecystitis: Secondary | ICD-10-CM

## 2018-12-18 LAB — SURGICAL PATHOLOGY

## 2018-12-18 MED ORDER — ONDANSETRON HCL 4 MG PO TABS: 4.0000 mg | ORAL_TABLET | Freq: Four times a day (QID) | ORAL | 0 refills | Status: DC | PRN

## 2018-12-18 NOTE — Progress Notes (Signed)
[facility] Progress Note  1 Day Post-Op  Subjective: CC: soreness Patient reports some mild soreness but overall pain well controlled. Has been passing flatus, denies nausea. Ambulating well. Wants to go home.   Objective: Vital signs in last 24 hours: Temp:  [97.4 F (36.3 C)-98.2 F (36.8 C)] 98.2 F (36.8 C) (11/24 0559) Pulse Rate:  [61-76] 68 (11/24 0559) Resp:  [14-20] 16 (11/24 0559) BP: (86-126)/(64-99) 102/64 (11/24 0559) SpO2:  [95 %-100 %] 97 % (11/24 0559) Weight:  [67.6 kg] 67.6 kg (11/23 1035) Last BM Date: 12/14/18  Intake/Output from previous day: 11/23 0701 - 11/24 0700 In: 727.4 [I.V.:727.4] Out: 50 [Blood:50] Intake/Output this shift: No intake/output data recorded.  PE: Gen:  Alert, NAD, pleasant Card:  Regular rate and rhythm, pedal pulses 2+ BL Pulm:  Normal effort, clear to auscultation bilaterally Abd: Soft, non-tender, non-distended, +BS, no HSM, incisions C/D/I Skin: warm and dry, no rashes  Psych: A&Ox3   Lab Results:  Recent Labs    12/17/18 0213  WBC 6.4  HGB 10.4*  HCT 31.9*  PLT 257   BMET Recent Labs    12/17/18 0213  NA 141  K 3.5  CL 113*  CO2 23  GLUCOSE 103*  BUN 5*  CREATININE 0.99  CALCIUM 7.9*   PT/INR No results for input(s): LABPROT, INR in the last 72 hours. CMP     Component Value Date/Time   NA 141 12/17/2018 0213   K 3.5 12/17/2018 0213   CL 113 (H) 12/17/2018 0213   CO2 23 12/17/2018 0213   GLUCOSE 103 (H) 12/17/2018 0213   BUN 5 (L) 12/17/2018 0213   CREATININE 0.99 12/17/2018 0213   CREATININE 1.00 10/11/2012 2010   CALCIUM 7.9 (L) 12/17/2018 0213   CALCIUM 9.5 09/16/2011 0844   PROT 4.9 (L) 12/17/2018 0213   ALBUMIN 2.6 (L) 12/17/2018 0213   AST 13 (L) 12/17/2018 0213   ALT 13 12/17/2018 0213   ALKPHOS 47 12/17/2018 0213   BILITOT 1.0 12/17/2018 0213   GFRNONAA 59 (L) 12/17/2018 0213   GFRAA >60 12/17/2018 0213   Lipase     Component Value Date/Time   LIPASE 22  12/14/2018 0334       Studies/Results: Nm Hepatobiliary Liver Func  Result Date: 12/16/2018 CLINICAL DATA:  Cholelithiasis. Evaluate for cystic duct patency and gallbladder ejection fraction EXAM: NUCLEAR MEDICINE HEPATOBILIARY IMAGING TECHNIQUE: Sequential images of the abdomen were obtained out to 60 minutes following intravenous administration of radiopharmaceutical. RADIOPHARMACEUTICALS:  5.5 mCi Tc-37m  Choletec IV COMPARISON:  Ultrasound exam in CT scan from 12/14/2018. FINDINGS: There is brisk uptake of radiotracer by the hepatic parenchyma with clearance of blood pool activity by 5 minutes period biliary activity is seen at 5 minutes with gut activity clearly visible at 10 minutes. After 90 minutes of observation, no gallbladder filling was observed. After 90 minutes of imaging, patient was administered 2.5 mg IV morphine sulfate and planar imaging performed for additional 30 minutes after morphine administration. No gallbladder filling observed after administration of IV morphine. IMPRESSION: 1. Normal hepatobiliary patency study. 2. No gallbladder filling after IV morphine sulfate administration. This suggests occlusion of the cystic duct and is highly concerning for acute cholecystitis. Electronically Signed   By: Misty Stanley M.D.   On: 12/16/2018 12:34    Anti-infectives: Anti-infectives (From admission, onward)   Start     Dose/Rate Route Frequency Ordered Stop   12/16/18 1400  piperacillin-tazobactam (ZOSYN) IVPB 3.375 g  3.375 g 12.5 mL/hr over 240 Minutes Intravenous Every 8 hours 12/16/18 1333 12/17/18 2123       Assessment/Plan Hypothyroidism Hypertension Hyperlipidemia CKD IBS/ constipation COVID infection 6 weeks ago  Acute cholecystitis S/p laparoscopic cholecystectomy - POD#1 - tolerating diet and passing flatus - pain well controlled - stable for discharge from surgery standpoint - follow up in chart    LOS: 3 days    Brigid Re ,  [facility] 12/18/2018, 9:30 AM Please see Amion for pager number during day hours 7:00am-4:30pm

## 2018-12-18 NOTE — Discharge Summary (Addendum)
Physician Discharge Summary  Megan Valentine [id number] DOB: 1950-08-25 DOA: 12/14/2018  PCP: Megan Maw, MD  Admit date: 12/14/2018 Discharge date: 12/18/2018  Admitted From: home Discharge disposition: home   Recommendations for Outpatient Follow-Up:   1. Outpatient follow up with general surgery   Discharge Diagnosis:   Principal Problem:   Abdominal pain Active Problems:   Hypothyroidism   Hyperlipidemia   Essential hypertension   CKD (chronic kidney disease) stage 3, GFR 30-59 ml/min   Acute cholecystitis    Discharge Condition: Improved.  Diet recommendation: Low sodium, heart healthy.   Wound care: None.  Code status: Full.   History of Present Illness:   Megan Valentine is a 68 y.o. female with medical history significant of hypothyroidism; HLD; CKD; and IBS presenting with abdominal pain.  She reports n/v last night about 11pm.  Severe epigastric pain.  Recurrent n/v.  She felt well yesterday, walked 2.5 miles without difficulty.  She had chili for dinner with chow chow on it.  Denies fever.  She had COVID infection about 6 weeks ago and no further symptoms.  Last emesis was about 3AM.  She reports that the pain radiates around her right side into her back.   Hospital Course by Problem:   Abdominal pain due to suspected acute cholecystitis -Patient presenting with acute onset of  abdominal pain with radiation from the RUQ and back and n/v  after eating chili -lipase negative -Work-up generally unremarkable including normal CBC and normal LFTs -Abd CT with gallstones and mild CBD dilation -RUQ [place] non-diagnostic - HIDA suggestive of acute cholecystitis  -cholecystectomy on 11/23   HLD -She does not appear to be on medications for this issue at this time -LDL was 117 in 9/19 -Outpatient f/u  Stage 3a CKD -Appears to be stable -Followed by nephrology as an outpatient  Hypokalemia -repleted   Hypothyroidism -Normal  TSH in 8/20 -Continue Synthroid at current dose for now  Hyperglycemia - stress response  H/o COVID-19 infection -Patient with no current symptoms associated with recent infection      Medical Consultants:    General surgery  Discharge Exam:   Vitals:   12/17/18 2358 12/18/18 0559  BP: (!) 86/68 102/64  Pulse: 61 68  Resp: 15 16  Temp: 98.1 F (36.7 C) 98.2 F (36.8 C)  SpO2: 95% 97%   Vitals:   12/17/18 1309 12/17/18 1803 12/17/18 2358 12/18/18 0559  BP: 121/69 120/71 (!) 86/68 102/64  Pulse:  73 61 68  Resp: 18 16 15 16   Temp: 98 F (36.7 C) 98.2 F (36.8 C) 98.1 F (36.7 C) 98.2 F (36.8 C)  TempSrc: Oral Oral Oral Oral  SpO2: 98% 96% 95% 97%  Weight:      Height:        General exam: Appears calm and comfortable. + flatus  The results of significant diagnostics from this hospitalization (including imaging, microbiology, ancillary and laboratory) are listed below for reference.     Procedures and Diagnostic Studies:   Ct Abdomen Pelvis W Contrast  Result Date: 12/14/2018 CLINICAL DATA:  Right upper quadrant pain with nausea and vomiting EXAM: CT ABDOMEN AND PELVIS WITH CONTRAST TECHNIQUE: Multidetector CT imaging of the abdomen and pelvis was performed using the standard protocol following bolus administration of intravenous contrast. CONTRAST:  150mL OMNIPAQUE IOHEXOL 300 MG/ML  SOLN COMPARISON:  None. FINDINGS: Lower chest:  No contributory findings. Hepatobiliary: Multiple simple appearing hepatic cysts scattered throughout the liver.  The largest measures 26 mm in the central liver.Full gallbladder with possible dependent calculi. No surrounding stranding. No common bile duct dilatation. Pancreas: Unremarkable. Spleen: Unremarkable. Adrenals/Urinary Tract: Negative adrenals. No hydronephrosis or stone. Unremarkable bladder. Stomach/Bowel: No obstruction. No appendicitis. Left colonic diverticulosis. Vascular/Lymphatic: No acute vascular abnormality. No  mass or adenopathy. Reproductive:Hysterectomy. Asymmetry at the vaginal cuff is likely related to scarring on the right. Other: No ascites or pneumoperitoneum. Musculoskeletal: No acute abnormalities. IMPRESSION: 1. Suspect small layering calculi. The gallbladder is full but there is no definite inflammation for acute cholecystitis. 2. Colonic diverticulosis. 3. Simple hepatic cysts. Electronically Signed   By: Monte Fantasia M.D.   On: 12/14/2018 05:13   US Abdomen Limited Ruq  Result Date: 12/14/2018 CLINICAL DATA:  Right upper quadrant pain. EXAM: ULTRASOUND ABDOMEN LIMITED RIGHT UPPER QUADRANT COMPARISON:  Abdominal CT earlier this day. FINDINGS: Gallbladder: Distended with small gallstones, largest stone measuring 4 mm. No gallbladder wall thickening or pericholecystic fluid. No sonographic Murphy sign noted by sonographer. Common bile duct: Diameter: 7 mm, upper normal.  No visualized choledocholithiasis. Liver: Multiple cysts throughout the liver including 2.9 cm complex cyst in the left lobe. No evidence of solid lesion. Within normal limits in parenchymal echogenicity. Portal vein is patent on color Doppler imaging with normal direction of blood flow towards the liver. Other: No ascites. IMPRESSION: 1. Gallstones with mild gallbladder distension. No secondary findings to suggest acute cholecystitis. 2. Borderline common bile duct at 7 mm. Recommend correlation with LFTs. If elevated, consider further evaluation with MRCP, if patient is able to tolerate breath hold technique. Electronically Signed   By: Keith Rake M.D.   On: 12/14/2018 06:57     Labs:   Basic Metabolic Panel: Recent Labs  Lab 12/14/18 0334 12/15/18 0453 12/17/18 0213  NA 141 140 141  K 3.5 3.2* 3.5  CL 110 109 113*  CO2 22 22 23   GLUCOSE 155* 93 103*  BUN 19 9 5*  CREATININE 1.08* 0.90 0.99  CALCIUM 9.5 8.2* 7.9*   GFR Estimated Creatinine Clearance: 51.4 mL/min (by C-G formula based on SCr of 0.99  mg/dL). Liver Function Tests: Recent Labs  Lab 12/14/18 0334 12/15/18 0453 12/17/18 0213  AST 16 19 13*  ALT 15 12 13   ALKPHOS 63 52 47  BILITOT 0.5 1.2 1.0  PROT 7.1 5.1* 4.9*  ALBUMIN 4.1 2.8* 2.6*   Recent Labs  Lab 12/14/18 0334  LIPASE 22   No results for input(s): AMMONIA in the last 168 hours. Coagulation profile No results for input(s): INR, PROTIME in the last 168 hours.  CBC: Recent Labs  Lab 12/14/18 0334 12/15/18 0453 12/17/18 0213  WBC 9.0 8.7 6.4  NEUTROABS 6.9  --   --   HGB 14.0 12.2 10.4*  HCT 42.0 35.5* 31.9*  MCV 94.6 94.9 97.6  PLT 354 245 257   Cardiac Enzymes: No results for input(s): CKTOTAL, CKMB, CKMBINDEX, TROPONINI in the last 168 hours. BNP: Invalid input(s): POCBNP CBG: No results for input(s): GLUCAP in the last 168 hours. D-Dimer No results for input(s): DDIMER in the last 72 hours. Hgb A1c No results for input(s): HGBA1C in the last 72 hours. Lipid Profile No results for input(s): CHOL, HDL, LDLCALC, TRIG, CHOLHDL, LDLDIRECT in the last 72 hours. Thyroid function studies No results for input(s): TSH, T4TOTAL, T3FREE, THYROIDAB in the last 72 hours.  Invalid input(s): FREET3 Anemia work up No results for input(s): VITAMINB12, FOLATE, FERRITIN, TIBC, IRON, RETICCTPCT in the last 72 hours.  Microbiology Recent Results (from the past 240 hour(s))  Surgical pcr screen     Status: None   Collection Time: 12/16/18  8:41 PM   Specimen: Nasal Mucosa; Nasal Swab  Result Value Ref Range Status   MRSA, PCR NEGATIVE NEGATIVE Final   Staphylococcus aureus NEGATIVE NEGATIVE Final    Comment: (NOTE) The Xpert SA Assay (FDA approved for NASAL specimens in patients 48 years of age and older), is one component of a comprehensive surveillance program. It is not intended to diagnose infection nor to guide or monitor treatment. Performed at [facility], [place] [address], [place], [place] [address]      Discharge Instructions:    Discharge Instructions    Diet general   Complete by: As directed    Increase activity slowly   Complete by: As directed      Allergies as of 12/18/2018      Reactions   Tetracycline Hives, Itching   Dilaudid [hydromorphone Hcl] Nausea And Vomiting      Medication List    STOP taking these medications   fluticasone 50 MCG/ACT nasal spray Commonly known as: FLONASE   meloxicam 15 MG tablet Commonly known as: MOBIC   triamcinolone ointment 0.1 % Commonly known as: KENALOG     TAKE these medications   acetaminophen 500 MG tablet Commonly known as: TYLENOL Take 1,000-1,500 mg by mouth every 6 (six) hours as needed for mild pain or headache.   albuterol 108 (90 Base) MCG/ACT inhaler Commonly known as: VENTOLIN HFA INHALE 2 PUFFS INTO THE LUNGS EVERY 6 HOURS AS NEEDED FOR WHEEZING OR SHORTNESS OF BREATH What changed:   how much to take  how to take this  when to take this  reasons to take this  additional instructions   aspirin 81 MG chewable tablet Chew 81 mg daily by mouth.   Biotin 1 MG Caps Take 1 mg by mouth daily.   cholecalciferol 25 MCG (1000 UT) tablet Commonly known as: VITAMIN D3 Take 1,000 Units by mouth daily.   estradiol 0.5 MG tablet Commonly known as: ESTRACE TAKE 1 TABLET(0.5 MG) BY MOUTH DAILY What changed:   how much to take  how to take this  when to take this  additional instructions   furosemide 20 MG tablet Commonly known as: LASIX Take 1 tablet (20 mg total) by mouth daily as needed. What changed: when to take this   hydroxypropyl methylcellulose / hypromellose 2.5 % ophthalmic solution Commonly known as: ISOPTO TEARS / GONIOVISC Place 1 drop into both eyes 3 (three) times daily as needed for dry eyes.   levothyroxine 100 MCG tablet Commonly known as: SYNTHROID TAKE 1 TABLET BY MOUTH DAILY....NEED ANNUAL FOR REFILLS What changed:   how much to take  how to take this  when to take this  additional  instructions   loratadine 10 MG tablet Commonly known as: CLARITIN Take 10 mg by mouth daily as needed for allergies.   meclizine 25 MG tablet Commonly known as: ANTIVERT Take 25 mg by mouth daily as needed for dizziness.   Natural Vegetable Laxative Tabs Take 8 tablets by mouth at bedtime.   ondansetron 4 MG tablet Commonly known as: ZOFRAN Take 1 tablet (4 mg total) by mouth every 6 (six) hours as needed for nausea.   pantoprazole 20 MG tablet Commonly known as: PROTONIX TAKE 1 TABLET(20 MG) BY MOUTH DAILY   potassium chloride SA 20 MEQ tablet Commonly known as: KLOR-CON Take one daily with  lasix. What changed:   how much to take  how to take this  when to take this  additional instructions   sodium chloride 0.65 % Soln nasal spray Commonly known as: OCEAN Place 1 spray into both nostrils as needed for congestion.   Stool Softener 100 MG capsule Generic drug: docusate sodium Take 400 mg by mouth at bedtime.   vitamin B-12 100 MCG tablet Commonly known as: CYANOCOBALAMIN Take 500 mcg by mouth daily.      Follow-up Information    Surgery, [place]. Go on 01/08/2019.   Specialty: General Surgery Why: Follow up appointment scheduled for 9:00 AM. Please arrive 30 min prior to appointment time. Bring photo ID and insurance information.  Contact information: [address] [place] [address] [phone]        Megan Maw, MD Follow up in 1 week(s).   Specialty: Family Medicine Contact information: [place] [address] [phone]            Time coordinating discharge: 35 min  Signed:  Geradine Girt DO  [facility] 12/18/2018, 2:53 PM

## 2018-12-18 NOTE — Progress Notes (Signed)
Ruchi Plaisted to be D/C'd Home per MD order.  Discussed with the patient and all questions fully answered.   VSS, Skin clean, dry and intact without evidence of skin break down, no evidence of skin tears noted. IV catheter discontinued intact. Site without signs and symptoms of complications. Dressing and pressure applied.   An After Visit Summary was printed and given to the patient.    D/C education completed with patient/family including follow up instructions, medication list, d/c activities limitations if indicated, with other d/c instructions as indicated by MD - patient able to verbalize understanding, all questions fully answered.    Patient instructed to return to ED, call 911, or call MD for any changes in condition.    Patient escorted via [place], and D/C home via car.

## 2018-12-19 ENCOUNTER — Telehealth: Payer: Self-pay | Admitting: Behavioral Health

## 2018-12-19 NOTE — Telephone Encounter (Signed)
Transition Care Management Follow-up Telephone Call  PCP: Libby Maw, MD  Admit date: 12/14/2018 Discharge date: 12/18/2018  Admitted From: home Discharge disposition: home   How have you been since you were released from the hospital? Patient stated, "I'm doing a lot better".   Do you understand why you were in the hospital? yes, patient stated, "I had my gallbladder removed".   Do you understand the discharge instructions? yes   Where were you discharged to? Home   Items Reviewed:  Medications reviewed: yes  Allergies reviewed: yes  Dietary changes reviewed: yes, pt. voiced that she's consuming a soft diet for the first few days post surgery & then resume eating a regular diet.  Referrals reviewed: yes, follow-up with PCP on 12/25/18 & surgeon on 01/10/19 at 2:30 PM.   Functional Questionnaire:   Activities of Daily Living (ADLs):   She states they are independent in the following: ambulation, bathing and hygiene, feeding, continence, grooming, toileting and dressing States they require assistance with the following: None   Any transportation issues/concerns?: no   Any patient concerns? no   Confirmed importance and date/time of follow-up visits scheduled yes, 12/25/2018 at 1:30 PM.  Provider Appointment booked with Dr. Ethelene Hal.  Confirmed with patient if condition begins to worsen call PCP or go to the ER.  Patient was given the office number and encouraged to call back with question or concerns.  : yes

## 2018-12-25 ENCOUNTER — Telehealth (INDEPENDENT_AMBULATORY_CARE_PROVIDER_SITE_OTHER): Payer: PPO | Admitting: Family Medicine

## 2018-12-25 ENCOUNTER — Other Ambulatory Visit: Payer: Self-pay

## 2018-12-25 ENCOUNTER — Encounter: Payer: Self-pay | Admitting: Family Medicine

## 2018-12-25 VITALS — BP 126/82 | HR 81 | Wt 143.0 lb

## 2018-12-25 DIAGNOSIS — R609 Edema, unspecified: Secondary | ICD-10-CM

## 2018-12-25 DIAGNOSIS — Z09 Encounter for follow-up examination after completed treatment for conditions other than malignant neoplasm: Secondary | ICD-10-CM | POA: Diagnosis not present

## 2018-12-25 MED ORDER — FUROSEMIDE 20 MG PO TABS: 20.0000 mg | ORAL_TABLET | Freq: Every day | ORAL | 1 refills | Status: DC | PRN

## 2018-12-25 NOTE — Progress Notes (Addendum)
Established Patient Office Visit  Subjective:  Patient ID: Megan Valentine, female    DOB: 01-02-1951  Age: 68 y.o. MRN: [id number]  CC:  Chief Complaint  Patient presents with  . Hospitalization Follow-up    HPI Megan Valentine presents for hospital discharge follow-up status post urgent cholecystectomy back on 23 November.  Patient is doing well.  She denies fever or chills.  Denies abdominal pain nausea or vomiting.  She is tolerating a normal diet.  Surgical incisions are sore but there is no significant erythema or drainage.  There is secured by Steri-Strips.  Follow-up with surgery as on 1215.  Blood pressure was exceptionally low on hospital admission.  Potassium was low as well.  Potassium was replenished.  Hemoglobin had dropped.  There was no blood loss noted at surgery.  She received copious fluids IV.  Past Medical History:  Diagnosis Date  . Allergy   . Arthritis   . Cataract   . Chronic bronchitis ([place])   . Complication of anesthesia    hard to wake up  . Diverticulosis   . Environmental allergies   . GERD (gastroesophageal reflux disease)   . Hiatal hernia   . History of chicken pox   . Hyperlipidemia   . Hypothyroidism   . IBS (irritable bowel syndrome)   . Migraines   . Mitral valve prolapse   . Rectal prolapse     Past Surgical History:  Procedure Laterality Date  . ABDOMINAL HYSTERECTOMY  1999  . APPENDECTOMY  1962  . BREAST CYST ASPIRATION Bilateral   . CHOLECYSTECTOMY N/A 12/17/2018   Procedure: LAPAROSCOPIC CHOLECYSTECTOMY;  Surgeon: Clovis Riley, MD;  Location: [facility];  Service: General;  Laterality: N/A;  . EYE SURGERY    . JOINT REPLACEMENT     rt knee scope  . REFRACTIVE SURGERY  2000   both eyes  . TOTAL KNEE ARTHROPLASTY Right 03/24/2015   Procedure: TOTAL KNEE ARTHROPLASTY;  Surgeon: [name], MD;  Location: [facility];  Service: Orthopedics;  Laterality: Right;  . TUBAL LIGATION  1977    Family History  Problem Relation Age of  Onset  . Stomach cancer Maternal Grandmother   . Hypertension Maternal Grandmother   . Diabetes Maternal Grandmother   . Stroke Maternal Grandmother   . Cancer Maternal Grandmother        Stomach cancer  . Colon cancer Paternal Grandfather   . Heart attack Paternal Grandfather   . Hyperlipidemia Mother   . Diabetes Mother   . Hypertension Mother   . Alzheimer's disease Mother   . Diabetes Father   . Hyperlipidemia Father   . Heart disease Father   . Heart attack Father   . Stroke Paternal Grandmother     Social History   Socioeconomic History  . Marital status: Widowed    Spouse name: Not on file  . Number of children: Not on file  . Years of education: 79  . Highest education level: Not on file  Occupational History  . 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 Financial resource strain: Not on file  . Food insecurity    Worry: Not on file    Inability: Not on file  . Transportation needs    Medical: Not on file    Non-medical: Not on file  Tobacco Use  . Smoking status: Former Smoker    Types: Cigarettes  Quit date: 03/23/1976    Years since quitting: 42.8  . Smokeless tobacco: Never Used  Substance and Sexual Activity  . Alcohol use: No  . Drug use: No  . Sexual activity: Yes    Birth control/protection: Abstinence  Lifestyle  . Physical activity    Days per week: Not on file    Minutes per session: Not on file  . Stress: Not on file  Relationships  . Social Herbalist on phone: Not on file    Gets together: Not on file    Attends religious service: Not on file    Active member of club or organization: Not on file    Attends meetings of clubs or organizations: Not on file    Relationship status: Not on file  . Intimate partner violence    Fear of current or ex partner: Not on file    Emotionally abused: Not on file    Physically abused: Not on file    Forced sexual activity: Not on  file  Other Topics Concern  . Not on file  Social History Narrative   Caffeine Use-yes   Regular exercise-no          Outpatient Medications Prior to Visit  Medication Sig Dispense Refill  . acetaminophen (TYLENOL) 500 MG tablet Take 1,000-1,500 mg by mouth every 6 (six) hours as needed for mild pain or headache.    . albuterol (VENTOLIN HFA) 108 (90 Base) MCG/ACT inhaler INHALE 2 PUFFS INTO THE LUNGS EVERY 6 HOURS AS NEEDED FOR WHEEZING OR SHORTNESS OF BREATH (Patient taking differently: Inhale 2 puffs into the lungs every 6 (six) hours as needed for wheezing or shortness of breath. ) 8.5 g 2  . aspirin 81 MG chewable tablet Chew 81 mg daily by mouth.    . Biotin 1 MG CAPS Take 1 mg by mouth daily.    . cholecalciferol (VITAMIN D3) 25 MCG (1000 UT) tablet Take 1,000 Units by mouth daily.    Marland Kitchen docusate sodium (STOOL SOFTENER) 100 MG capsule Take 400 mg by mouth at bedtime.    Marland Kitchen estradiol (ESTRACE) 0.5 MG tablet TAKE 1 TABLET(0.5 MG) BY MOUTH DAILY (Patient taking differently: Take 0.5 mg by mouth daily. ) 90 tablet 2  . hydroxypropyl methylcellulose / hypromellose (ISOPTO TEARS / GONIOVISC) 2.5 % ophthalmic solution Place 1 drop into both eyes 3 (three) times daily as needed for dry eyes.    Marland Kitchen levothyroxine (SYNTHROID) 100 MCG tablet TAKE 1 TABLET BY MOUTH DAILY....NEED ANNUAL FOR REFILLS (Patient taking differently: Take 100 mcg by mouth daily before breakfast. ) 90 tablet 2  . loratadine (CLARITIN) 10 MG tablet Take 10 mg by mouth daily as needed for allergies.    Marland Kitchen meclizine (ANTIVERT) 25 MG tablet Take 25 mg by mouth daily as needed for dizziness.     . ondansetron (ZOFRAN) 4 MG tablet Take 1 tablet (4 mg total) by mouth every 6 (six) hours as needed for nausea. 20 tablet 0  . pantoprazole (PROTONIX) 20 MG tablet TAKE 1 TABLET(20 MG) BY MOUTH DAILY 90 tablet 1  . potassium chloride SA (K-DUR) 20 MEQ tablet Take one daily with lasix. (Patient taking differently: Take 20 mEq by mouth at  bedtime. ) 90 tablet 1  . Senna-Fennel (NATURAL VEGETABLE LAXATIVE) TABS Take 8 tablets by mouth at bedtime.    . sodium chloride (OCEAN) 0.65 % SOLN nasal spray Place 1 spray into both nostrils as needed for congestion.    Marland Kitchen  vitamin B-12 (CYANOCOBALAMIN) 100 MCG tablet Take 500 mcg by mouth daily.    . furosemide (LASIX) 20 MG tablet Take 1 tablet (20 mg total) by mouth daily as needed. (Patient taking differently: Take 20 mg by mouth daily. ) 90 tablet 1   No facility-administered medications prior to visit.     Allergies  Allergen Reactions  . Tetracycline Hives and Itching  . Dilaudid [Hydromorphone Hcl] Nausea And Vomiting    ROS Review of Systems  Constitutional: Negative for diaphoresis, fatigue, fever and unexpected weight change.  Respiratory: Negative.   Cardiovascular: Negative.   Gastrointestinal: Negative for abdominal distention, abdominal pain, blood in stool, constipation, diarrhea and vomiting.  Genitourinary: Negative.   Musculoskeletal: Negative.   Neurological: Negative.   Hematological: Does not bruise/bleed easily.  Psychiatric/Behavioral: Negative.       Objective:    Physical Exam  Constitutional: She is oriented to person, place, and time. She appears well-developed and well-nourished. No distress.  HENT:  Head: Normocephalic and atraumatic.  Right Ear: External ear normal.  Left Ear: External ear normal.  Eyes: Conjunctivae are normal. Right eye exhibits no discharge. Left eye exhibits no discharge. No scleral icterus.  Neck: No JVD present. No tracheal deviation present.  Pulmonary/Chest: Effort normal. No stridor.  Neurological: She is alert and oriented to person, place, and time.  Skin: She is not diaphoretic.  Psychiatric: She has a normal mood and affect. Her behavior is normal.    BP 126/82   Pulse 81   Wt 143 lb (64.9 kg)   BMI 24.55 kg/m  Wt Readings from Last 3 Encounters:  12/25/18 143 lb (64.9 kg)  12/17/18 149 lb (67.6 kg)   10/05/18 153 lb (69.4 kg)   BP Readings from Last 3 Encounters:  12/25/18 126/82  12/18/18 102/64  10/05/18 110/72   Guideline developer:  UpToDate (see UpToDate for funding source) Date Released: June 2014  There are no preventive care reminders to display for this patient.  There are no preventive care reminders to display for this patient.  Lab Results  Component Value Date   TSH 2.97 09/04/2018   Lab Results  Component Value Date   WBC 6.4 12/17/2018   HGB 10.4 (L) 12/17/2018   HCT 31.9 (L) 12/17/2018   MCV 97.6 12/17/2018   PLT 257 12/17/2018   Lab Results  Component Value Date   NA 141 12/17/2018   K 3.5 12/17/2018   CO2 23 12/17/2018   GLUCOSE 103 (H) 12/17/2018   BUN 5 (L) 12/17/2018   CREATININE 0.99 12/17/2018   BILITOT 1.0 12/17/2018   ALKPHOS 47 12/17/2018   AST 13 (L) 12/17/2018   ALT 13 12/17/2018   PROT 4.9 (L) 12/17/2018   ALBUMIN 2.6 (L) 12/17/2018   CALCIUM 7.9 (L) 12/17/2018   ANIONGAP 5 12/17/2018   GFR 59.86 (L) 10/05/2018   Lab Results  Component Value Date   CHOL 213 (H) 10/19/2017   Lab Results  Component Value Date   HDL 75.50 10/19/2017   Lab Results  Component Value Date   LDLCALC 117 (H) 10/19/2017   Lab Results  Component Value Date   TRIG 98.0 10/19/2017   Lab Results  Component Value Date   CHOLHDL 3 10/19/2017   No results found for: HGBA1C    Assessment & Plan:   Problem List Items Addressed This Visit    None    Visit Diagnoses    Hospital discharge follow-up    -  Primary  Relevant Orders   Basic Metabolic Panel (BMET)   CBC   Iron, TIBC and Ferritin Panel      No orders of the defined types were placed in this encounter.   Follow-up: Return in about 10 days (around 01/04/2019).   We will recheck her potassium and hemoglobin on the 11th.  She request an iron level checked. Virtual Visit via Video Note  I connected with Megan Valentine on 12/31/18 at  1:30 PM EST by a video enabled telemedicine  application and verified that I am speaking with the correct person using two identifiers.  Location: Patient: home with her significant other, Louie Casa. Provider:   I discussed the limitations of evaluation and management by telemedicine and the availability of in person appointments. The patient expressed understanding and agreed to proceed.  History of Present Illness:    Observations/Objective:   Assessment and Plan:   Follow Up Instructions:    I discussed the assessment and treatment plan with the patient. The patient was provided an opportunity to ask questions and all were answered. The patient agreed with the plan and demonstrated an understanding of the instructions.   The patient was advised to call back or seek an in-person evaluation if the symptoms worsen or if the condition fails to improve as anticipated.  I provided 24 minutes of non-face-to-face time during this encounter.   Libby Maw, MD

## 2018-12-28 ENCOUNTER — Telehealth: Payer: Self-pay

## 2018-12-28 NOTE — Telephone Encounter (Signed)
[facility]-Pt is going to come in with her boyfriend Keturah Barre on 12.5.20/she sees you on 12.11.20 but would like to have her labs drawn on 12.5.20 for her appointment/plz advise what you would like to do/thx dmf

## 2018-12-31 ENCOUNTER — Other Ambulatory Visit: Payer: PPO

## 2018-12-31 ENCOUNTER — Encounter: Payer: Self-pay | Admitting: Family Medicine

## 2018-12-31 ENCOUNTER — Other Ambulatory Visit: Payer: Self-pay

## 2018-12-31 NOTE — Telephone Encounter (Signed)
Labs have been ordered. She will need to make an appointment.

## 2018-12-31 NOTE — Addendum Note (Signed)
Addended by: Abelino Derrick A on: 12/31/2018 01:06 PM   Modules accepted: Orders

## 2018-12-31 NOTE — Telephone Encounter (Signed)
mychart message sent to pt

## 2019-01-01 ENCOUNTER — Other Ambulatory Visit: Payer: Self-pay

## 2019-01-01 ENCOUNTER — Other Ambulatory Visit (INDEPENDENT_AMBULATORY_CARE_PROVIDER_SITE_OTHER): Payer: PPO

## 2019-01-01 DIAGNOSIS — Z09 Encounter for follow-up examination after completed treatment for conditions other than malignant neoplasm: Secondary | ICD-10-CM

## 2019-01-01 MED ORDER — PANTOPRAZOLE SODIUM 20 MG PO TBEC: DELAYED_RELEASE_TABLET | ORAL | 1 refills | Status: DC

## 2019-01-01 NOTE — Addendum Note (Signed)
Addended by: Lynnea Ferrier on: 01/01/2019 03:05 PM   Modules accepted: Orders

## 2019-01-01 NOTE — Telephone Encounter (Signed)
Last VV 12/25/18 Last fill 07/03/18  #90/1

## 2019-01-02 ENCOUNTER — Encounter: Payer: Self-pay | Admitting: Family Medicine

## 2019-01-02 ENCOUNTER — Other Ambulatory Visit: Payer: Self-pay

## 2019-01-02 LAB — CBC
HCT: 40.5 % (ref 35.0–45.0)
Hemoglobin: 13.7 g/dL (ref 11.7–15.5)
MCH: 31.6 pg (ref 27.0–33.0)
MCHC: 33.8 g/dL (ref 32.0–36.0)
MCV: 93.3 fL (ref 80.0–100.0)
MPV: 10.8 fL (ref 7.5–12.5)
Platelets: 445 10*3/uL — ABNORMAL HIGH (ref 140–400)
RBC: 4.34 10*6/uL (ref 3.80–5.10)
RDW: 12.6 % (ref 11.0–15.0)
WBC: 7.3 10*3/uL (ref 3.8–10.8)

## 2019-01-02 LAB — BASIC METABOLIC PANEL
BUN: 17 mg/dL (ref 7–25)
CO2: 22 mmol/L (ref 20–32)
Calcium: 8.9 mg/dL (ref 8.6–10.4)
Chloride: 110 mmol/L (ref 98–110)
Creat: 0.93 mg/dL (ref 0.50–0.99)
Glucose, Bld: 100 mg/dL — ABNORMAL HIGH (ref 65–99)
Potassium: 3.4 mmol/L — ABNORMAL LOW (ref 3.5–5.3)
Sodium: 143 mmol/L (ref 135–146)

## 2019-01-02 LAB — IRON,TIBC AND FERRITIN PANEL
%SAT: 23 % (calc) (ref 16–45)
Ferritin: 76 ng/mL (ref 16–288)
Iron: 67 ug/dL (ref 45–160)
TIBC: 290 mcg/dL (calc) (ref 250–450)

## 2019-01-02 MED ORDER — FERROUS SULFATE 325 (65 FE) MG PO TABS: 325.0000 mg | ORAL_TABLET | Freq: Every day | ORAL | 3 refills | Status: DC

## 2019-01-02 NOTE — Telephone Encounter (Signed)
Please see message and advise.  Thank you. Ok to send in prescribed Iron?

## 2019-01-03 ENCOUNTER — Other Ambulatory Visit: Payer: Self-pay

## 2019-01-04 ENCOUNTER — Encounter: Payer: Self-pay | Admitting: Family Medicine

## 2019-01-04 ENCOUNTER — Ambulatory Visit (INDEPENDENT_AMBULATORY_CARE_PROVIDER_SITE_OTHER): Payer: PPO | Admitting: Family Medicine

## 2019-01-04 VITALS — BP 120/62 | HR 84 | Temp 97.8°F | Wt 148.0 lb

## 2019-01-04 DIAGNOSIS — E559 Vitamin D deficiency, unspecified: Secondary | ICD-10-CM

## 2019-01-04 DIAGNOSIS — E876 Hypokalemia: Secondary | ICD-10-CM | POA: Diagnosis not present

## 2019-01-04 DIAGNOSIS — Z09 Encounter for follow-up examination after completed treatment for conditions other than malignant neoplasm: Secondary | ICD-10-CM | POA: Insufficient documentation

## 2019-01-04 LAB — URINALYSIS, ROUTINE W REFLEX MICROSCOPIC
Bilirubin Urine: NEGATIVE
Hgb urine dipstick: NEGATIVE
Ketones, ur: NEGATIVE
Leukocytes,Ua: NEGATIVE
Nitrite: NEGATIVE
RBC / HPF: NONE SEEN (ref 0–?)
Specific Gravity, Urine: 1.02 (ref 1.000–1.030)
Total Protein, Urine: NEGATIVE
Urine Glucose: NEGATIVE
Urobilinogen, UA: 0.2 (ref 0.0–1.0)
pH: 5.5 (ref 5.0–8.0)

## 2019-01-04 LAB — BASIC METABOLIC PANEL
BUN: 16 mg/dL (ref 6–23)
CO2: 27 mEq/L (ref 19–32)
Calcium: 9.2 mg/dL (ref 8.4–10.5)
Chloride: 106 mEq/L (ref 96–112)
Creatinine, Ser: 1.05 mg/dL (ref 0.40–1.20)
GFR: 51.99 mL/min — ABNORMAL LOW (ref >60.00)
Glucose, Bld: 103 mg/dL — ABNORMAL HIGH (ref 70–99)
Potassium: 3.8 mEq/L (ref 3.5–5.1)
Sodium: 141 mEq/L (ref 135–145)

## 2019-01-04 LAB — VITAMIN D 25 HYDROXY (VIT D DEFICIENCY, FRACTURES): VITD: 33.23 ng/mL (ref 30.00–100.00)

## 2019-01-04 NOTE — Progress Notes (Signed)
Established Patient Office Visit  Subjective:  Patient ID: Megan Valentine, female    DOB: 1950/03/03  Age: 68 y.o. MRN: [id number]  CC:  Chief Complaint  Patient presents with  . Follow-up    pt is here 3 month follow up pt would like to discuss lab work     HPI Megan Valentine presents for live hospital discharge follow-up, hypokalemia and vitamin D deficiency.  She has done well since surgery.  She had experienced muscle spasms that responded to a muscle relaxer that are now recent resolved.  Continues to take her potassium chloride as well as eat bananas.  She is supplementing with vitamin D.  Denies significant abdominal pain nausea or vomiting.  She is taking her iron pill daily without issue.  She does deal with chronic constipation.  Past Medical History:  Diagnosis Date  . Allergy   . Arthritis   . Cataract   . Chronic bronchitis ([place])   . Complication of anesthesia    hard to wake up  . Diverticulosis   . Environmental allergies   . GERD (gastroesophageal reflux disease)   . Hiatal hernia   . History of chicken pox   . Hyperlipidemia   . Hypothyroidism   . IBS (irritable bowel syndrome)   . Migraines   . Mitral valve prolapse   . Rectal prolapse     Past Surgical History:  Procedure Laterality Date  . ABDOMINAL HYSTERECTOMY  1999  . APPENDECTOMY  1962  . BREAST CYST ASPIRATION Bilateral   . CHOLECYSTECTOMY N/A 12/17/2018   Procedure: LAPAROSCOPIC CHOLECYSTECTOMY;  Surgeon: Clovis Riley, MD;  Location: [place];  Service: General;  Laterality: N/A;  . EYE SURGERY    . JOINT REPLACEMENT     rt knee scope  . REFRACTIVE SURGERY  2000   both eyes  . TOTAL KNEE ARTHROPLASTY Right 03/24/2015   Procedure: TOTAL KNEE ARTHROPLASTY;  Surgeon: [name], MD;  Location: [place];  Service: Orthopedics;  Laterality: Right;  . TUBAL LIGATION  1977    Family History  Problem Relation Age of Onset  . Stomach cancer Maternal Grandmother   . Hypertension Maternal  Grandmother   . Diabetes Maternal Grandmother   . Stroke Maternal Grandmother   . Cancer Maternal Grandmother        Stomach cancer  . Colon cancer Paternal Grandfather   . Heart attack Paternal Grandfather   . Hyperlipidemia Mother   . Diabetes Mother   . Hypertension Mother   . Alzheimer's disease Mother   . Diabetes Father   . Hyperlipidemia Father   . Heart disease Father   . Heart attack Father   . Stroke Paternal Grandmother     Social History   Socioeconomic History  . Marital status: Widowed    Spouse name: Not on file  . Number of children: Not on file  . Years of education: 79  . Highest education level: Not on file  Occupational History  . 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 Smoking status: Former Smoker    Types: Cigarettes    Quit date: 03/23/1976    Years since quitting: 42.8  . Smokeless tobacco: Never Used  Substance and Sexual Activity  . Alcohol use: No  . Drug use: No  . Sexual activity: Yes    Birth control/protection: Abstinence  Other Topics Concern  . Not on file  Social  History Narrative   Caffeine Use-yes   Regular exercise-no         Social Determinants of Health   Financial Resource Strain:   . Difficulty of Paying Living Expenses: Not on file  Food Insecurity:   . Worried About Charity fundraiser in the Last Year: Not on file  . Ran Out of Food in the Last Year: Not on file  Transportation Needs:   . Lack of Transportation (Medical): Not on file  . Lack of Transportation (Non-Medical): Not on file  Physical Activity:   . Days of Exercise per Week: Not on file  . Minutes of Exercise per Session: Not on file  Stress:   . Feeling of Stress : Not on file  Social Connections:   . Frequency of Communication with Friends and Family: Not on file  . Frequency of Social Gatherings with Friends and Family: Not on file  . Attends Religious Services: Not on file  . Active Member  of Clubs or Organizations: Not on file  . Attends Archivist Meetings: Not on file  . Marital Status: Not on file  Intimate Partner Violence:   . Fear of Current or Ex-Partner: Not on file  . Emotionally Abused: Not on file  . Physically Abused: Not on file  . Sexually Abused: Not on file    Outpatient Medications Prior to Visit  Medication Sig Dispense Refill  . acetaminophen (TYLENOL) 500 MG tablet Take 1,000-1,500 mg by mouth every 6 (six) hours as needed for mild pain or headache.    . albuterol (VENTOLIN HFA) 108 (90 Base) MCG/ACT inhaler INHALE 2 PUFFS INTO THE LUNGS EVERY 6 HOURS AS NEEDED FOR WHEEZING OR SHORTNESS OF BREATH (Patient taking differently: Inhale 2 puffs into the lungs every 6 (six) hours as needed for wheezing or shortness of breath. ) 8.5 g 2  . aspirin 81 MG chewable tablet Chew 81 mg daily by mouth.    . Biotin 1 MG CAPS Take 1 mg by mouth daily.    . cholecalciferol (VITAMIN D3) 25 MCG (1000 UT) tablet Take 1,000 Units by mouth daily.    . Cranberry-Vitamin C-Vitamin E (CRANBERRY PLUS VITAMIN C) 4200-20-3 MG-MG-UNIT CAPS Take 2 capsules by mouth daily.    Marland Kitchen docusate sodium (STOOL SOFTENER) 100 MG capsule Take 400 mg by mouth at bedtime.    Marland Kitchen estradiol (ESTRACE) 0.5 MG tablet TAKE 1 TABLET(0.5 MG) BY MOUTH DAILY (Patient taking differently: Take 0.5 mg by mouth daily. ) 90 tablet 2  . ferrous sulfate 325 (65 FE) MG tablet Take 1 tablet (325 mg total) by mouth daily with breakfast. 30 tablet 3  . furosemide (LASIX) 20 MG tablet Take 1 tablet (20 mg total) by mouth daily as needed. 90 tablet 1  . hydroxypropyl methylcellulose / hypromellose (ISOPTO TEARS / GONIOVISC) 2.5 % ophthalmic solution Place 1 drop into both eyes 3 (three) times daily as needed for dry eyes.    Marland Kitchen levothyroxine (SYNTHROID) 100 MCG tablet TAKE 1 TABLET BY MOUTH DAILY....NEED ANNUAL FOR REFILLS (Patient taking differently: Take 100 mcg by mouth daily before breakfast. ) 90 tablet 2  .  loratadine (CLARITIN) 10 MG tablet Take 10 mg by mouth daily as needed for allergies.    Marland Kitchen meclizine (ANTIVERT) 25 MG tablet Take 25 mg by mouth daily as needed for dizziness.     . ondansetron (ZOFRAN) 4 MG tablet Take 1 tablet (4 mg total) by mouth every 6 (six) hours as needed for  nausea. 20 tablet 0  . pantoprazole (PROTONIX) 20 MG tablet TAKE 1 TABLET(20 MG) BY MOUTH DAILY 90 tablet 1  . potassium chloride SA (K-DUR) 20 MEQ tablet Take one daily with lasix. (Patient taking differently: Take 20 mEq by mouth at bedtime. ) 90 tablet 1  . Senna-Fennel (NATURAL VEGETABLE LAXATIVE) TABS Take 8 tablets by mouth at bedtime.    . sodium chloride (OCEAN) 0.65 % SOLN nasal spray Place 1 spray into both nostrils as needed for congestion.    . vitamin B-12 (CYANOCOBALAMIN) 100 MCG tablet Take 500 mcg by mouth daily.     No facility-administered medications prior to visit.    Allergies  Allergen Reactions  . Tetracycline Hives and Itching  . Dilaudid [Hydromorphone Hcl] Nausea And Vomiting    ROS Review of Systems  Constitutional: Negative.   HENT: Negative.   Respiratory: Negative.   Cardiovascular: Negative.   Gastrointestinal: Positive for constipation. Negative for abdominal pain, blood in stool, nausea and vomiting.  Genitourinary: Negative.   Musculoskeletal: Negative.   Neurological: Negative.   Hematological: Negative.   Psychiatric/Behavioral: Negative.       Objective:    Physical Exam  Constitutional: She appears well-developed and well-nourished. No distress.  HENT:  Head: Normocephalic and atraumatic.  Right Ear: External ear normal.  Left Ear: External ear normal.  Eyes: Conjunctivae are normal. Right eye exhibits no discharge. Left eye exhibits no discharge. No scleral icterus.  Neck: No JVD present. No tracheal deviation present.  Cardiovascular: Normal rate, regular rhythm and normal heart sounds.  Pulmonary/Chest: Effort normal and breath sounds normal. No  stridor.  Abdominal: Soft. Bowel sounds are normal. She exhibits no distension. There is no abdominal tenderness. There is no rebound, no guarding and no CVA tenderness.    Skin: She is not diaphoretic.    BP 120/62 (BP Location: Right Arm, Patient Position: Sitting, Cuff Size: Normal)   Pulse 84   Temp 97.8 F (36.6 C) (Temporal)   Wt 148 lb (67.1 kg)   SpO2 98%   BMI 25.40 kg/m  Wt Readings from Last 3 Encounters:  01/04/19 148 lb (67.1 kg)  12/25/18 143 lb (64.9 kg)  12/17/18 149 lb (67.6 kg)     There are no preventive care reminders to display for this patient.  There are no preventive care reminders to display for this patient.  Lab Results  Component Value Date   TSH 2.97 09/04/2018   Lab Results  Component Value Date   WBC 7.3 01/01/2019   HGB 13.7 01/01/2019   HCT 40.5 01/01/2019   MCV 93.3 01/01/2019   PLT 445 (H) 01/01/2019   Lab Results  Component Value Date   NA 143 01/01/2019   K 3.4 (L) 01/01/2019   CO2 22 01/01/2019   GLUCOSE 100 (H) 01/01/2019   BUN 17 01/01/2019   CREATININE 0.93 01/01/2019   BILITOT 1.0 12/17/2018   ALKPHOS 47 12/17/2018   AST 13 (L) 12/17/2018   ALT 13 12/17/2018   PROT 4.9 (L) 12/17/2018   ALBUMIN 2.6 (L) 12/17/2018   CALCIUM 8.9 01/01/2019   ANIONGAP 5 12/17/2018   GFR 59.86 (L) 10/05/2018   Lab Results  Component Value Date   CHOL 213 (H) 10/19/2017   Lab Results  Component Value Date   HDL 75.50 10/19/2017   Lab Results  Component Value Date   LDLCALC 117 (H) 10/19/2017   Lab Results  Component Value Date   TRIG 98.0 10/19/2017   Lab Results  Component Value Date   CHOLHDL 3 10/19/2017   No results found for: HGBA1C    Assessment & Plan:   Problem List Items Addressed This Visit      Other   Hypokalemia - Primary   Relevant Orders   Basic Metabolic Panel (BMET)   Urinalysis, Routine w reflex microscopic   Vitamin D deficiency   Relevant Orders   VITAMIN D 25 Hydroxy (Vit-D Deficiency,  Fractures)   Hospital discharge follow-up      No orders of the defined types were placed in this encounter.   Follow-up: Return in about 6 weeks (around 02/15/2019).   Continue chlorthalidone with potassium.  Surgical follow-up on Monday.  Follow-up with me in 6 weeks. Libby Maw, MD

## 2019-01-05 ENCOUNTER — Encounter: Payer: Self-pay | Admitting: Family Medicine

## 2019-01-16 DIAGNOSIS — Z961 Presence of intraocular lens: Secondary | ICD-10-CM | POA: Diagnosis not present

## 2019-01-16 DIAGNOSIS — H40013 Open angle with borderline findings, low risk, bilateral: Secondary | ICD-10-CM | POA: Diagnosis not present

## 2019-02-14 ENCOUNTER — Other Ambulatory Visit: Payer: Self-pay

## 2019-02-15 ENCOUNTER — Encounter: Payer: Self-pay | Admitting: Family Medicine

## 2019-02-15 ENCOUNTER — Ambulatory Visit (INDEPENDENT_AMBULATORY_CARE_PROVIDER_SITE_OTHER): Payer: PPO | Admitting: Family Medicine

## 2019-02-15 VITALS — BP 100/68 | HR 77 | Temp 96.1°F | Ht 64.0 in | Wt 151.2 lb

## 2019-02-15 DIAGNOSIS — Z8616 Personal history of COVID-19: Secondary | ICD-10-CM

## 2019-02-15 DIAGNOSIS — D649 Anemia, unspecified: Secondary | ICD-10-CM | POA: Diagnosis not present

## 2019-02-15 DIAGNOSIS — N1831 Chronic kidney disease, stage 3a: Secondary | ICD-10-CM

## 2019-02-15 DIAGNOSIS — E611 Iron deficiency: Secondary | ICD-10-CM | POA: Diagnosis not present

## 2019-02-15 DIAGNOSIS — E876 Hypokalemia: Secondary | ICD-10-CM | POA: Diagnosis not present

## 2019-02-15 LAB — CBC
HCT: 40.8 % (ref 36.0–46.0)
Hemoglobin: 13.5 g/dL (ref 12.0–15.0)
MCHC: 33 g/dL (ref 30.0–36.0)
MCV: 95.1 fl (ref 78.0–100.0)
Platelets: 337 10*3/uL (ref 150.0–400.0)
RBC: 4.29 Mil/uL (ref 3.87–5.11)
RDW: 13.9 % (ref 11.5–15.5)
WBC: 6 10*3/uL (ref 4.0–10.5)

## 2019-02-15 LAB — BASIC METABOLIC PANEL
BUN: 20 mg/dL (ref 6–23)
CO2: 27 mEq/L (ref 19–32)
Calcium: 9.4 mg/dL (ref 8.4–10.5)
Chloride: 108 mEq/L (ref 96–112)
Creatinine, Ser: 1 mg/dL (ref 0.40–1.20)
GFR: 54.99 mL/min — ABNORMAL LOW (ref >60.00)
Glucose, Bld: 118 mg/dL — ABNORMAL HIGH (ref 70–99)
Potassium: 4 mEq/L (ref 3.5–5.1)
Sodium: 144 mEq/L (ref 135–145)

## 2019-02-15 NOTE — Progress Notes (Signed)
Established Patient Office Visit  Subjective:  Patient ID: Megan Valentine, female    DOB: 10/23/1950  Age: 69 y.o. MRN: [id number]  CC:  Chief Complaint  Patient presents with  . Follow-up    follow up on Hypokalemia and iron, no concerns. Pt states that she have decreased iron to taking M,W,F due to constipation.     HPI [address] presents for follow-up of her anemia, iron deficiency, hypokalemia and recent Covid infection.  Taking iron every other day which helps her deal with constipation.  She is taking the potassium pill daily.  Denies any lingering effects from the Covid virus.  She deals with occasional wheezing which is relieved with her inhaler when needed.  Rarely uses her inhaler.  Not exercising much.  However she has started to use her exercise bike.  Reminds me that her only child passed in an MVA 26 years ago.  No need for pelvic and Paps secondary to complete abdominal hysterectomy.  She is having yearly mammograms.  Sees a dentist twice yearly.  Past Medical History:  Diagnosis Date  . Allergy   . Arthritis   . Cataract   . Chronic bronchitis ([place])   . Complication of anesthesia    hard to wake up  . Diverticulosis   . Environmental allergies   . GERD (gastroesophageal reflux disease)   . Hiatal hernia   . History of chicken pox   . Hyperlipidemia   . Hypothyroidism   . IBS (irritable bowel syndrome)   . Migraines   . Mitral valve prolapse   . Rectal prolapse     Past Surgical History:  Procedure Laterality Date  . ABDOMINAL HYSTERECTOMY  1999  . APPENDECTOMY  1962  . BREAST CYST ASPIRATION Bilateral   . CHOLECYSTECTOMY N/A 12/17/2018   Procedure: LAPAROSCOPIC CHOLECYSTECTOMY;  Surgeon: Clovis Riley, MD;  Location: [place];  Service: General;  Laterality: N/A;  . EYE SURGERY    . JOINT REPLACEMENT     rt knee scope  . REFRACTIVE SURGERY  2000   both eyes  . TOTAL KNEE ARTHROPLASTY Right 03/24/2015   Procedure: TOTAL KNEE ARTHROPLASTY;   Surgeon: [name], MD;  Location: [place];  Service: Orthopedics;  Laterality: Right;  . TUBAL LIGATION  1977    Family History  Problem Relation Age of Onset  . Stomach cancer Maternal Grandmother   . Hypertension Maternal Grandmother   . Diabetes Maternal Grandmother   . Stroke Maternal Grandmother   . Cancer Maternal Grandmother        Stomach cancer  . Colon cancer Paternal Grandfather   . Heart attack Paternal Grandfather   . Hyperlipidemia Mother   . Diabetes Mother   . Hypertension Mother   . Alzheimer's disease Mother   . Diabetes Father   . Hyperlipidemia Father   . Heart disease Father   . Heart attack Father   . Stroke Paternal Grandmother     Social History   Socioeconomic History  . Marital status: Widowed    Spouse name: Not on file  . Number of children: Not on file  . Years of education: 62  . Highest education level: Not on file  Occupational History  . 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 Smoking status: Former Smoker    Types: Cigarettes    Quit date: 03/23/1976    Years since quitting: 42.9  .  Smokeless tobacco: Never Used  Substance and Sexual Activity  . Alcohol use: No  . Drug use: No  . Sexual activity: Yes    Birth control/protection: Abstinence  Other Topics Concern  . Not on file  Social History Narrative   Caffeine Use-yes   Regular exercise-no         Social Determinants of Health   Financial Resource Strain:   . Difficulty of Paying Living Expenses: Not on file  Food Insecurity:   . Worried About Charity fundraiser in the Last Year: Not on file  . Ran Out of Food in the Last Year: Not on file  Transportation Needs:   . Lack of Transportation (Medical): Not on file  . Lack of Transportation (Non-Medical): Not on file  Physical Activity:   . Days of Exercise per Week: Not on file  . Minutes of Exercise per Session: Not on file  Stress:   . Feeling of Stress  : Not on file  Social Connections:   . Frequency of Communication with Friends and Family: Not on file  . Frequency of Social Gatherings with Friends and Family: Not on file  . Attends Religious Services: Not on file  . Active Member of Clubs or Organizations: Not on file  . Attends Archivist Meetings: Not on file  . Marital Status: Not on file  Intimate Partner Violence:   . Fear of Current or Ex-Partner: Not on file  . Emotionally Abused: Not on file  . Physically Abused: Not on file  . Sexually Abused: Not on file    Outpatient Medications Prior to Visit  Medication Sig Dispense Refill  . acetaminophen (TYLENOL) 500 MG tablet Take 1,000-1,500 mg by mouth every 6 (six) hours as needed for mild pain or headache.    . albuterol (VENTOLIN HFA) 108 (90 Base) MCG/ACT inhaler INHALE 2 PUFFS INTO THE LUNGS EVERY 6 HOURS AS NEEDED FOR WHEEZING OR SHORTNESS OF BREATH (Patient taking differently: Inhale 2 puffs into the lungs every 6 (six) hours as needed for wheezing or shortness of breath. ) 8.5 g 2  . aspirin 81 MG chewable tablet Chew 81 mg daily by mouth.    . Biotin 1 MG CAPS Take 1 mg by mouth daily.    . cholecalciferol (VITAMIN D3) 25 MCG (1000 UT) tablet Take 1,000 Units by mouth daily.    . Cranberry-Vitamin C-Vitamin E (CRANBERRY PLUS VITAMIN C) 4200-20-3 MG-MG-UNIT CAPS Take 2 capsules by mouth daily.    Marland Kitchen docusate sodium (STOOL SOFTENER) 100 MG capsule Take 400 mg by mouth at bedtime.    Marland Kitchen estradiol (ESTRACE) 0.5 MG tablet TAKE 1 TABLET(0.5 MG) BY MOUTH DAILY (Patient taking differently: Take 0.5 mg by mouth daily. ) 90 tablet 2  . ferrous sulfate 325 (65 FE) MG tablet Take 1 tablet (325 mg total) by mouth daily with breakfast. 30 tablet 3  . furosemide (LASIX) 20 MG tablet Take 1 tablet (20 mg total) by mouth daily as needed. 90 tablet 1  . hydroxypropyl methylcellulose / hypromellose (ISOPTO TEARS / GONIOVISC) 2.5 % ophthalmic solution Place 1 drop into both eyes 3  (three) times daily as needed for dry eyes.    Marland Kitchen levothyroxine (SYNTHROID) 100 MCG tablet TAKE 1 TABLET BY MOUTH DAILY....NEED ANNUAL FOR REFILLS (Patient taking differently: Take 100 mcg by mouth daily before breakfast. ) 90 tablet 2  . loratadine (CLARITIN) 10 MG tablet Take 10 mg by mouth daily as needed for allergies.    Marland Kitchen  meclizine (ANTIVERT) 25 MG tablet Take 25 mg by mouth daily as needed for dizziness.     . pantoprazole (PROTONIX) 20 MG tablet TAKE 1 TABLET(20 MG) BY MOUTH DAILY 90 tablet 1  . potassium chloride SA (K-DUR) 20 MEQ tablet Take one daily with lasix. (Patient taking differently: Take 20 mEq by mouth at bedtime. ) 90 tablet 1  . Senna-Fennel (NATURAL VEGETABLE LAXATIVE) TABS Take 8 tablets by mouth at bedtime.    . sodium chloride (OCEAN) 0.65 % SOLN nasal spray Place 1 spray into both nostrils as needed for congestion.    . vitamin B-12 (CYANOCOBALAMIN) 100 MCG tablet Take 500 mcg by mouth daily.    . ondansetron (ZOFRAN) 4 MG tablet Take 1 tablet (4 mg total) by mouth every 6 (six) hours as needed for nausea. (Patient not taking: Reported on 02/15/2019) 20 tablet 0   No facility-administered medications prior to visit.    Allergies  Allergen Reactions  . Tetracycline Hives and Itching  . Dilaudid [Hydromorphone Hcl] Nausea And Vomiting    ROS Review of Systems  Constitutional: Negative.   HENT: Negative.   Eyes: Negative for photophobia and visual disturbance.  Respiratory: Negative.   Cardiovascular: Negative.   Gastrointestinal: Negative.   Endocrine: Negative for polyphagia and polyuria.  Genitourinary: Negative.   Musculoskeletal: Positive for arthralgias and gait problem.  Skin: Negative for pallor and rash.  Allergic/Immunologic: Negative for immunocompromised state.  Neurological: Negative for light-headedness and numbness.  Hematological: Does not bruise/bleed easily.  Psychiatric/Behavioral: Negative.       Objective:    Physical Exam    Constitutional: She is oriented to person, place, and time. She appears well-developed and well-nourished. No distress.  HENT:  Head: Normocephalic and atraumatic.  Right Ear: External ear normal.  Left Ear: External ear normal.  Eyes: Conjunctivae are normal. Right eye exhibits no discharge. Left eye exhibits no discharge.  Neck: No JVD present. No tracheal deviation present. No thyromegaly present.  Cardiovascular: Normal rate, regular rhythm and normal heart sounds.  Pulmonary/Chest: Effort normal and breath sounds normal. No stridor.  Musculoskeletal:        General: No edema.  Lymphadenopathy:    She has no cervical adenopathy.  Neurological: She is alert and oriented to person, place, and time.  Skin: Skin is warm. She is not diaphoretic.  Psychiatric: She has a normal mood and affect. Her behavior is normal.    BP 100/68   Pulse 77   Temp (!) 96.1 F (35.6 C) (Tympanic)   Ht 5\' 4"  (1.626 m)   Wt 151 lb 3.2 oz (68.6 kg)   SpO2 99%   BMI 25.95 kg/m  Wt Readings from Last 3 Encounters:  02/15/19 151 lb 3.2 oz (68.6 kg)  01/04/19 148 lb (67.1 kg)  12/25/18 143 lb (64.9 kg)     There are no preventive care reminders to display for this patient.  There are no preventive care reminders to display for this patient.  Lab Results  Component Value Date   TSH 2.97 09/04/2018   Lab Results  Component Value Date   WBC 7.3 01/01/2019   HGB 13.7 01/01/2019   HCT 40.5 01/01/2019   MCV 93.3 01/01/2019   PLT 445 (H) 01/01/2019   Lab Results  Component Value Date   NA 141 01/04/2019   K 3.8 01/04/2019   CO2 27 01/04/2019   GLUCOSE 103 (H) 01/04/2019   BUN 16 01/04/2019   CREATININE 1.05 01/04/2019   BILITOT 1.0  12/17/2018   ALKPHOS 47 12/17/2018   AST 13 (L) 12/17/2018   ALT 13 12/17/2018   PROT 4.9 (L) 12/17/2018   ALBUMIN 2.6 (L) 12/17/2018   CALCIUM 9.2 01/04/2019   ANIONGAP 5 12/17/2018   GFR 51.99 (L) 01/04/2019   Lab Results  Component Value Date    CHOL 213 (H) 10/19/2017   Lab Results  Component Value Date   HDL 75.50 10/19/2017   Lab Results  Component Value Date   LDLCALC 117 (H) 10/19/2017   Lab Results  Component Value Date   TRIG 98.0 10/19/2017   Lab Results  Component Value Date   CHOLHDL 3 10/19/2017   No results found for: HGBA1C    Assessment & Plan:   Problem List Items Addressed This Visit      Genitourinary   Stage 3a chronic kidney disease   Relevant Orders   Basic metabolic panel     Other   Hypokalemia - Primary   Relevant Orders   Basic metabolic panel   History of 2019 novel coronavirus disease (COVID-19)   Relevant Orders   SAR CoV2 Serology (COVID 19)AB(IGG)IA   Anemia   Relevant Orders   CBC   Iron deficiency   Relevant Orders   Iron, TIBC and Ferritin Panel      No orders of the defined types were placed in this encounter.   Follow-up: Return in about 6 months (around 08/15/2019), or suggested follow up will pend results of blood work.Libby Maw, MD

## 2019-02-16 LAB — IRON,TIBC AND FERRITIN PANEL
%SAT: 24 % (calc) (ref 16–45)
Ferritin: 57 ng/mL (ref 16–288)
Iron: 71 ug/dL (ref 45–160)
TIBC: 291 mcg/dL (calc) (ref 250–450)

## 2019-02-16 LAB — SAR COV2 SEROLOGY (COVID19)AB(IGG),IA: SARS CoV2 AB IGG: POSITIVE — AB

## 2019-03-07 ENCOUNTER — Encounter: Payer: Self-pay | Admitting: Family Medicine

## 2019-03-27 ENCOUNTER — Other Ambulatory Visit: Payer: Self-pay | Admitting: Family Medicine

## 2019-03-27 DIAGNOSIS — E876 Hypokalemia: Secondary | ICD-10-CM

## 2019-04-04 ENCOUNTER — Encounter: Payer: Self-pay | Admitting: Family Medicine

## 2019-04-05 NOTE — Telephone Encounter (Signed)
That is okay for her to do.

## 2019-04-08 ENCOUNTER — Other Ambulatory Visit: Payer: Self-pay

## 2019-04-08 ENCOUNTER — Telehealth: Payer: Self-pay | Admitting: Family Medicine

## 2019-04-08 ENCOUNTER — Encounter: Payer: Self-pay | Admitting: Family Medicine

## 2019-04-08 NOTE — Telephone Encounter (Signed)
Spoke with patient who states that she had a incident this past Saturday somehow she feel not sure where she hit her head but she does have a knot on her head. Appointment was offered at different location I also let patient know that it was best for her to be seen at ED for evaluation. She let me know that she did not want to go to ED due to the wait and she does not feel like she needs to be seen there. Appointment scheduled to see Baldo Ash on 04/09/19

## 2019-04-08 NOTE — Telephone Encounter (Signed)
Dr. Ethelene Hal please advise is this okay with you? Patient had head injury needed a visit today declined to be seen at different location states that she would wait to be seen by one there the providers here tomorrow.

## 2019-04-08 NOTE — Telephone Encounter (Signed)
Nurse called and stated that patient needed to be seen within 4 hours for a head injury. Informed nurse and patient  that there were no available appointments today and asked patient to contact another office to see if there were any availability but pt declined and wanted to wait until tomorrow to be seen. Pt has an appointment tomorrow with Baldo Ash due to Dr. Ethelene Hal being booked. Pls advise.

## 2019-04-08 NOTE — Telephone Encounter (Signed)
Any significant head injury needs to be seen in the ER.

## 2019-04-09 ENCOUNTER — Ambulatory Visit (INDEPENDENT_AMBULATORY_CARE_PROVIDER_SITE_OTHER): Payer: PPO | Admitting: Nurse Practitioner

## 2019-04-09 ENCOUNTER — Ambulatory Visit (INDEPENDENT_AMBULATORY_CARE_PROVIDER_SITE_OTHER)
Admission: RE | Admit: 2019-04-09 | Discharge: 2019-04-09 | Disposition: A | Discharge status: Home / Self Care | Payer: PPO | Source: Ambulatory Visit | Attending: Nurse Practitioner | Admitting: Nurse Practitioner

## 2019-04-09 ENCOUNTER — Encounter: Payer: Self-pay | Admitting: Nurse Practitioner

## 2019-04-09 VITALS — BP 128/78 | HR 76 | Temp 97.2°F | Ht 64.0 in | Wt 153.2 lb

## 2019-04-09 DIAGNOSIS — S060X9A Concussion with loss of consciousness of unspecified duration, initial encounter: Secondary | ICD-10-CM | POA: Diagnosis not present

## 2019-04-09 DIAGNOSIS — R55 Syncope and collapse: Secondary | ICD-10-CM | POA: Diagnosis not present

## 2019-04-09 DIAGNOSIS — S098XXA Other specified injuries of head, initial encounter: Secondary | ICD-10-CM | POA: Diagnosis not present

## 2019-04-09 DIAGNOSIS — S0512XA Contusion of eyeball and orbital tissues, left eye, initial encounter: Secondary | ICD-10-CM | POA: Diagnosis not present

## 2019-04-09 NOTE — Patient Instructions (Addendum)
Avoid any aspirin or nsaid. Use tylenol for headache or pain.  Normal head CT Entered referral to cardiology and neurology.  Head Injury, Adult There are many types of head injuries. They can be as minor as a bump. Some head injuries can be worse. Worse injuries include:  A strong hit to the head that shakes the brain back and forth causing damage (concussion).  A bruise (contusion) of the brain. This means there is bleeding in the brain that can cause swelling.  A cracked skull (skull fracture).  Bleeding in the brain that gathers, gets thick (makes a clot), and forms a bump (hematoma). Most problems from a head injury come in the first 24 hours. However, you may still have side effects up to 7-10 days after your injury. It is important to watch your condition for any changes. You may need to be watched in the emergency department or urgent care, or you may need to stay in the hospital. What are the causes? There are many possible causes of a head injury. A serious head injury may be caused by:  A car accident.  Bicycle or motorcycle accidents.  Sports injuries.  Falls. What are the signs or symptoms? Symptoms of a head injury include a bruise, bump, or bleeding where the injury happened. Other physical symptoms may include:  Headache.  Feeling sick to your stomach (nauseous) or vomiting.  Dizziness.  Feeling tired.  Being uncomfortable around bright lights or loud noises.  Shaking movements that you cannot control (seizures).  Trouble being woken up.  Passing out (fainting). Mental or emotional symptoms may include:  Feeling grumpy or cranky.  Confusion and memory problems.  Having trouble paying attention or concentrating.  Changes in eating or sleeping habits.  Feeling worried or nervous (anxious).  Feeling sad (depressed). How is this treated? Treatment for this condition depends on how severe the injury is and the type of injury you have. The main goal  is to prevent complications and to allow the brain time to heal. Mild head injury If you have a mild head injury, you may be sent home and treatment may include:  Being watched. A responsible adult should stay with you for 24 hours after your injury and check on you often.  Physical rest.  Brain rest.  Pain medicines. Severe head injury If you have a severe head injury, treatment may include:  Being watched closely. This includes hospitalization with frequent physical exams.  Medicines to: ? Help with pain. ? Prevent shaking movements that you cannot control. ? Help with brain swelling.  Using a machine that helps you breathe (ventilator).  Treatments to manage the swelling inside the brain.  Brain surgery. This may be needed to: ? Remove a blood clot. ? Stop the bleeding. ? Remove a part of the skull. This allows room for the brain to swell. Follow these instructions at home: Activity  Rest.  Avoid activities that are hard or tiring.  Make sure you get enough sleep.  Limit activities that need a lot of thought or attention, such as: ? Watching TV. ? Playing memory games and puzzles. ? Job-related work or homework. ? Working on [employer], and texting.  Avoid activities that could cause another head injury until your doctor says it is okay. This includes playing sports. Having another head injury, especially before the first one has healed, can be dangerous.  Ask your doctor when it is safe for you to go back to your normal activities,  such as work or school. Ask your doctor for a step-by-step plan for slowly going back to your normal activities.  Ask your doctor when you can drive, ride a bicycle, or use heavy machinery. Do not do these activities if you are dizzy. Lifestyle   Do not drink alcohol until your doctor says it is okay.  Do not use drugs.  If it is harder than usual to remember things, write them down.  If you are easily  distracted, try to do one thing at a time.  Talk with family members or close friends when making important decisions.  Tell your friends, family, a trusted coworker, and work Fre[employer] about your injury, symptoms, and limits (restrictions). Have them watch for any problems that are new or getting worse. General instructions  Take over-the-counter and prescription medicines only as told by your doctor.  Have someone stay with you for 24 hours after your head injury. This person should watch you for any changes in your symptoms and be ready to get help.  Keep all follow-up visits as told by your doctor. This is important. How is this prevented?  Work on [employer]. This can help you avoid falls.  Wear a seatbelt when you are in a moving vehicle.  Wear a helmet when you: ? Ride a bicycle. ? Ski. ? Do any other sport or activity that has a risk of injury.  If you drink alcohol: ? Limit how much you use to:  0-1 drink a day for women.  0-2 drinks a day for men. ? Be aware of how much alcohol is in your drink. In the [place] one drink equals one 12 oz bottle of beer (355 mL), one 5 oz glass of wine (148 mL), or one 1 oz glass of hard liquor (44 mL).  Make your home safer by: ? Getting rid of clutter from the floors and stairs. This includes things that can make you trip. ? Using grab bars in bathrooms and handrails by stairs. ? Placing non-slip mats on floors and in bathtubs. ? Putting more light in dim areas. Get help right away if:  You have: ? A very bad headache that is not helped by medicine. ? Trouble walking or weakness in your arms and legs. ? Clear or bloody fluid coming from your nose or ears. ? Changes in how you see (vision). ? Shaking movements that you cannot control.  You lose your balance.  You vomit.  The black centers of your eyes (pupils) change in size.  Your speech is slurred.  Your dizziness gets worse.  You pass out.  You are  sleepier than normal and have trouble staying awake.  Your symptoms get worse. These symptoms may be an emergency. Do not wait to see if the symptoms will go away. Get medical help right away. Call your local emergency services (911 in the [place] Do not drive yourself to the hospital. Summary  There are many types of head injuries. They can be as minor as a bump. Some head injuries can be worse  Treatment for this condition depends on how severe the injury is and the type of injury you have.  Ask your doctor when it is safe for you to go back to your normal activities, such as work or school.  To prevent a head injury, wear a seat belt in a car, wear a helmet when you use a a bicycle, limit your alcohol use, and make your home safer. This  information is not intended to replace advice given to you by your health care provider. Make sure you discuss any questions you have with your health care provider. Document Revised: 05/03/2018 Document Reviewed: 02/02/2018 Elsevier Patient Education  [place].

## 2019-04-09 NOTE — Progress Notes (Signed)
Subjective:  Patient ID: Megan Valentine, female    DOB: 1950-07-08  Age: 69 y.o. MRN: [id number]  CC: Fall (pt stated that she got really dizzy and sweaty Sat morn after her 2nd covid shot and passed out//pt must have hit her head either on the dryerwas out for 25 min she thinks//left side, knee, shoulder, and left eye and face are sore and bruised//pt states still sore an has a dull headache)  Loss of Consciousness This is a new problem. The current episode started in the past 7 days (on saturday morning). The problem has been resolved. She lost consciousness for a period of greater than 5 minutes (reports it lasted 61mins). The symptoms are aggravated by sitting up. Associated symptoms include an aura, bladder incontinence, bowel incontinence, dizziness, headaches, light-headedness and vertigo. Pertinent negatives include no abdominal pain, auditory change, chest pain, clumsiness, confusion, diaphoresis, fever, focal sensory loss, focal weakness, malaise/fatigue, nausea, palpitations, slurred speech, visual change, vomiting or weakness. Associated symptoms comments: diaphoretic and dizzy prior to LOC. She has tried nothing for the symptoms. Her past medical history is significant for HTN and vertigo. There is no history of arrhythmia, CAD, a clotting disorder, CVA, DM, seizures, a sudden death in family or TIA.  2nd COVID vaccine administered on Friday, incident occurred on Saturday Morning. She thinks she was unconscious for about 25mins based on the last time she talked to her boyfriend that morning. She lives alone, but visited daily by her boyfriend. Today she denies any dizziness, but has mild headache. Denies any change in vision.  Reviewed past Medical, Social and Family history today.  Outpatient Medications Prior to Visit  Medication Sig Dispense Refill  . acetaminophen (TYLENOL) 500 MG tablet Take 1,000-1,500 mg by mouth every 6 (six) hours as needed for mild pain or headache.    .  albuterol (VENTOLIN HFA) 108 (90 Base) MCG/ACT inhaler INHALE 2 PUFFS INTO THE LUNGS EVERY 6 HOURS AS NEEDED FOR WHEEZING OR SHORTNESS OF BREATH (Patient taking differently: Inhale 2 puffs into the lungs every 6 (six) hours as needed for wheezing or shortness of breath. ) 8.5 g 2  . aspirin 81 MG chewable tablet Chew 81 mg daily by mouth.    . Biotin 1 MG CAPS Take 1 mg by mouth daily.    . cholecalciferol (VITAMIN D3) 25 MCG (1000 UT) tablet Take 1,000 Units by mouth daily.    . Cranberry-Vitamin C-Vitamin E (CRANBERRY PLUS VITAMIN C) 4200-20-3 MG-MG-UNIT CAPS Take 2 capsules by mouth daily.    Marland Kitchen docusate sodium (STOOL SOFTENER) 100 MG capsule Take 400 mg by mouth at bedtime.    Marland Kitchen estradiol (ESTRACE) 0.5 MG tablet TAKE 1 TABLET(0.5 MG) BY MOUTH DAILY (Patient taking differently: Take 0.5 mg by mouth daily. ) 90 tablet 2  . ferrous sulfate 325 (65 FE) MG tablet Take 1 tablet (325 mg total) by mouth daily with breakfast. 30 tablet 3  . furosemide (LASIX) 20 MG tablet Take 1 tablet (20 mg total) by mouth daily as needed. 90 tablet 1  . hydroxypropyl methylcellulose / hypromellose (ISOPTO TEARS / GONIOVISC) 2.5 % ophthalmic solution Place 1 drop into both eyes 3 (three) times daily as needed for dry eyes.    Marland Kitchen levothyroxine (SYNTHROID) 100 MCG tablet TAKE 1 TABLET BY MOUTH DAILY....NEED ANNUAL FOR REFILLS (Patient taking differently: Take 100 mcg by mouth daily before breakfast. ) 90 tablet 2  . loratadine (CLARITIN) 10 MG tablet Take 10 mg by mouth daily as needed  for allergies.    Marland Kitchen meclizine (ANTIVERT) 25 MG tablet Take 25 mg by mouth daily as needed for dizziness.     . ondansetron (ZOFRAN) 4 MG tablet Take 1 tablet (4 mg total) by mouth every 6 (six) hours as needed for nausea. 20 tablet 0  . pantoprazole (PROTONIX) 20 MG tablet TAKE 1 TABLET(20 MG) BY MOUTH DAILY 90 tablet 1  . potassium chloride SA (KLOR-CON) 20 MEQ tablet TAKE 1 TABLET BY MOUTH DAILY WITH LASIX 90 tablet 0  . Senna-Fennel  (NATURAL VEGETABLE LAXATIVE) TABS Take 8 tablets by mouth at bedtime.    . sodium chloride (OCEAN) 0.65 % SOLN nasal spray Place 1 spray into both nostrils as needed for congestion.    . vitamin B-12 (CYANOCOBALAMIN) 100 MCG tablet Take 500 mcg by mouth daily.     No facility-administered medications prior to visit.    ROS See HPI  Objective:  BP 128/78   Pulse 76   Temp (!) 97.2 F (36.2 C) (Tympanic)   Ht 5\' 4"  (1.626 m)   Wt 153 lb 3.2 oz (69.5 kg)   SpO2 98%   BMI 26.30 kg/m   BP Readings from Last 3 Encounters:  04/09/19 128/78  02/15/19 100/68  01/04/19 120/62    Wt Readings from Last 3 Encounters:  04/09/19 153 lb 3.2 oz (69.5 kg)  02/15/19 151 lb 3.2 oz (68.6 kg)  01/04/19 148 lb (67.1 kg)   ECG: NSR, no change compared 09/2016 ECG.  Physical Exam Vitals reviewed.  HENT:     Head: Raccoon eyes present. No laceration. Hair is normal.     Jaw: There is normal jaw occlusion.     Salivary Glands: Right salivary gland is not diffusely enlarged or tender. Left salivary gland is not diffusely enlarged or tender.      Right Ear: External ear normal. No mastoid tenderness.     Left Ear: External ear normal. No mastoid tenderness.     Nose: Nose normal.  Cardiovascular:     Rate and Rhythm: Normal rate and regular rhythm.     Pulses: Normal pulses.     Heart sounds: Normal heart sounds.  Pulmonary:     Effort: Pulmonary effort is normal.     Breath sounds: Normal breath sounds.  Musculoskeletal:     Cervical back: Normal range of motion and neck supple.  Skin:    General: Skin is warm and dry.     Findings: Bruising present.  Neurological:     Mental Status: She is alert and oriented to person, place, and time.     Cranial Nerves: No cranial nerve deficit.     Motor: No weakness.     Coordination: Coordination normal.     Gait: Gait normal.  Psychiatric:        Mood and Affect: Mood normal.        Behavior: Behavior normal.        Thought Content:  Thought content normal.     Lab Results  Component Value Date   WBC 6.0 02/15/2019   HGB 13.5 02/15/2019   HCT 40.8 02/15/2019   PLT 337.0 02/15/2019   GLUCOSE 118 (H) 02/15/2019   CHOL 213 (H) 10/19/2017   TRIG 98.0 10/19/2017   HDL 75.50 10/19/2017   LDLDIRECT 159.0 02/13/2014   LDLCALC 117 (H) 10/19/2017   ALT 13 12/17/2018   AST 13 (L) 12/17/2018   NA 144 02/15/2019   K 4.0 02/15/2019   CL 108 02/15/2019  CREATININE 1.00 02/15/2019   BUN 20 02/15/2019   CO2 27 02/15/2019   TSH 2.97 09/04/2018   INR 1.02 03/13/2015   MICROALBUR <0.7 10/19/2017    Assessment & Plan:  This visit occurred during the SARS-CoV-2 public health emergency.  Safety protocols were in place, including screening questions prior to the visit, additional usage of staff PPE, and extensive cleaning of exam room while observing appropriate contact time as indicated for disinfecting solutions.   Megan Valentine was seen today for fall.  Diagnoses and all orders for this visit:  Syncope, unspecified syncope type -     EKG 12-Lead -     CT Head Wo Contrast; Future -     Ambulatory referral to Neurology -     Ambulatory referral to Cardiology  Blunt head trauma, initial encounter -     CT Head Wo Contrast; Future -     Ambulatory referral to Neurology  Concussion with < 1 hr loss of consciousness -     Ambulatory referral to Neurology -     Ambulatory referral to Cardiology   I am having Megan Valentine maintain her meclizine, aspirin, estradiol, levothyroxine, vitamin B-12, albuterol, hydroxypropyl methylcellulose / hypromellose, acetaminophen, Natural Vegetable Laxative, docusate sodium, cholecalciferol, Biotin, loratadine, sodium chloride, ondansetron, furosemide, pantoprazole, ferrous sulfate, Cranberry Plus Vitamin C, and potassium chloride SA.  No orders of the defined types were placed in this encounter.   Problem List Items Addressed This Visit    None    Visit Diagnoses    Syncope,  unspecified syncope type    -  Primary   Relevant Orders   EKG 12-Lead (Completed)   CT Head Wo Contrast (Completed)   Ambulatory referral to Neurology   Ambulatory referral to Cardiology   Blunt head trauma, initial encounter       Relevant Orders   CT Head Wo Contrast (Completed)   Ambulatory referral to Neurology   Concussion with < 1 hr loss of consciousness       Relevant Orders   Ambulatory referral to Neurology   Ambulatory referral to Cardiology      Follow-up: Return if symptoms worsen or fail to improve.  Wilfred Lacy, NP

## 2019-04-11 ENCOUNTER — Encounter: Payer: Self-pay | Admitting: Nurse Practitioner

## 2019-04-12 ENCOUNTER — Encounter: Payer: Self-pay | Admitting: Nurse Practitioner

## 2019-04-15 ENCOUNTER — Other Ambulatory Visit: Payer: Self-pay

## 2019-04-15 ENCOUNTER — Ambulatory Visit: Payer: PPO | Admitting: Interventional Cardiology

## 2019-04-15 ENCOUNTER — Encounter: Payer: Self-pay | Admitting: Nurse Practitioner

## 2019-04-15 ENCOUNTER — Encounter: Payer: Self-pay | Admitting: Interventional Cardiology

## 2019-04-15 VITALS — BP 102/68 | HR 86 | Ht 64.0 in | Wt 153.8 lb

## 2019-04-15 DIAGNOSIS — R0609 Other forms of dyspnea: Secondary | ICD-10-CM

## 2019-04-15 DIAGNOSIS — R0600 Dyspnea, unspecified: Secondary | ICD-10-CM

## 2019-04-15 DIAGNOSIS — I059 Rheumatic mitral valve disease, unspecified: Secondary | ICD-10-CM

## 2019-04-15 DIAGNOSIS — Z8616 Personal history of COVID-19: Secondary | ICD-10-CM

## 2019-04-15 DIAGNOSIS — R55 Syncope and collapse: Secondary | ICD-10-CM

## 2019-04-15 NOTE — Patient Instructions (Signed)
Medication Instructions:  Your physician recommends that you continue on your current medications as directed. Please refer to the Current Medication list given to you today.  *If you need a refill on your cardiac medications before your next appointment, please call your pharmacy*   Lab Work: None If you have labs (blood work) drawn today and your tests are completely normal, you will receive your results only by: . MyChart Message (if you have MyChart) OR . A paper copy in the mail If you have any lab test that is abnormal or we need to change your treatment, we will call you to review the results.   Testing/Procedures: Your physician has requested that you have an echocardiogram. Echocardiography is a painless test that uses sound waves to create images of your heart. It provides your doctor with information about the size and shape of your heart and how well your heart's chambers and valves are working. This procedure takes approximately one hour. There are no restrictions for this procedure.    Follow-Up: At [facility], you and your health needs are our priority.  As part of our continuing mission to provide you with exceptional heart care, we have created designated Provider Care Teams.  These Care Teams include your primary Cardiologist (physician) and Advanced Practice Providers (APPs -  Physician Assistants and Nurse Practitioners) who all work together to provide you with the care you need, when you need it.  We recommend signing up for the patient portal called "MyChart".  Sign up information is provided on this After Visit Summary.  MyChart is used to connect with patients for Virtual Visits (Telemedicine).  Patients are able to view lab/test results, encounter notes, upcoming appointments, etc.  Non-urgent messages can be sent to your provider as well.   To learn more about what you can do with MyChart, go to [email or web address].    Your next appointment:   As  needed  The format for your next appointment:   In Person  Provider:   You may see Dr. Henry Smith or one of the following Advanced Practice Providers on your designated Care Team:    Lori Gerhardt, NP  Laura Ingold, NP  Jill McDaniel, NP    Other Instructions   

## 2019-04-15 NOTE — Progress Notes (Signed)
Cardiology Office Note:    Date:  04/15/2019   ID:  Megan Valentine, DOB 03-Oct-1950, MRN [id number]  PCP:  Libby Maw, MD  Cardiologist:  No primary care provider on file.   Referring MD: Flossie Buffy, NP   Chief Complaint  Patient presents with  . Loss of Consciousness    History of Present Illness:    Megan Valentine is a 69 y.o. female with a hx of she is referred because of recent episode of syncope with injury to her left forehead and left eye.  Prior history of syncope in the past.  Widowed 69 year old female with history of "mitral valve prolapse 20 years ago or greater".  2020 was a rough year.  In mid spring last year she had protracted respiratory illness but was never diagnosed with COVID-19.  That seemed to get better by early summer.  In October she developed upper respiratory symptoms and was diagnosed with Covid.  Her boyfriend was also diagnosed at the same time.  Prior to the diagnosis of Covid she had abdominal surgery.  After she got over Covid she had a laparoscopic cholecystectomy.  Covid did not cause shortness of breath.  She does feel that preexisting the Covid diagnosis if she would have some swelling and shortness of breath.  This is been going on for years.  Within the past 10 days she had an episode where she was getting out of her shower, began feeling weak, became diaphoretic, sat on her commode, and felt that her vision was graying.  Next thing she knew she had recovered on the floor of her kitchen.  Apparently fallen and traumatized her left eye.  CT scan did not demonstrate intracranial bleed.  No recurrence since that time and during and after the episode she did not notice any palpitations or chest pain.  She does give a history of fainting at least 6 times in her life. These episodes are preceeded by a prodrome of sweating, weakness and difficulty with vision. Thiese date back to adolescence and adult life.  Past Medical History:  Diagnosis  Date  . Allergy   . Arthritis   . Cataract   . Chronic bronchitis ([place])   . Complication of anesthesia    hard to wake up  . Diverticulosis   . Environmental allergies   . GERD (gastroesophageal reflux disease)   . Hiatal hernia   . History of chicken pox   . Hyperlipidemia   . Hypothyroidism   . IBS (irritable bowel syndrome)   . Migraines   . Mitral valve prolapse   . Rectal prolapse     Past Surgical History:  Procedure Laterality Date  . ABDOMINAL HYSTERECTOMY  1999  . APPENDECTOMY  1962  . BREAST CYST ASPIRATION Bilateral   . CHOLECYSTECTOMY N/A 12/17/2018   Procedure: LAPAROSCOPIC CHOLECYSTECTOMY;  Surgeon: Clovis Riley, MD;  Location: [place];  Service: General;  Laterality: N/A;  . EYE SURGERY    . JOINT REPLACEMENT     rt knee scope  . REFRACTIVE SURGERY  2000   both eyes  . TOTAL KNEE ARTHROPLASTY Right 03/24/2015   Procedure: TOTAL KNEE ARTHROPLASTY;  Surgeon: [name], MD;  Location: [facility];  Service: Orthopedics;  Laterality: Right;  . TUBAL LIGATION  1977    Current Medications: Current Meds  Medication Sig  . acetaminophen (TYLENOL) 500 MG tablet Take 1,000-1,500 mg by mouth every 6 (six) hours as needed for mild pain or headache.  Marland Kitchen  albuterol (VENTOLIN HFA) 108 (90 Base) MCG/ACT inhaler INHALE 2 PUFFS INTO THE LUNGS EVERY 6 HOURS AS NEEDED FOR WHEEZING OR SHORTNESS OF BREATH  . aspirin 81 MG chewable tablet Chew 81 mg daily by mouth.  . Biotin 1 MG CAPS Take 1 mg by mouth daily.  . Brimonidine Tartrate (LUMIFY) 0.025 % SOLN Apply 1 drop to eye 2 (two) times daily. Both eyes  . cholecalciferol (VITAMIN D3) 25 MCG (1000 UT) tablet Take 2,000 Units by mouth daily.   . Cranberry-Vitamin C-Vitamin E (CRANBERRY PLUS VITAMIN C) 4200-20-3 MG-MG-UNIT CAPS Take 2 capsules by mouth daily.  Marland Kitchen docusate sodium (STOOL SOFTENER) 100 MG capsule Take 400 mg by mouth at bedtime.  Marland Kitchen estradiol (ESTRACE) 0.5 MG tablet TAKE 1 TABLET(0.5 MG) BY MOUTH DAILY  .  ferrous sulfate 325 (65 FE) MG tablet Take 1 tablet (325 mg total) by mouth daily with breakfast.  . furosemide (LASIX) 20 MG tablet Take 20 mg by mouth daily.  . hydroxypropyl methylcellulose / hypromellose (ISOPTO TEARS / GONIOVISC) 2.5 % ophthalmic solution Place 1 drop into both eyes 3 (three) times daily as needed for dry eyes.  Marland Kitchen levothyroxine (SYNTHROID) 100 MCG tablet TAKE 1 TABLET BY MOUTH DAILY....NEED ANNUAL FOR REFILLS  . pantoprazole (PROTONIX) 20 MG tablet TAKE 1 TABLET(20 MG) BY MOUTH DAILY  . potassium chloride SA (KLOR-CON) 20 MEQ tablet TAKE 1 TABLET BY MOUTH DAILY WITH LASIX  . Senna-Fennel (NATURAL VEGETABLE LAXATIVE) TABS Take 8 tablets by mouth at bedtime.  . sodium chloride (OCEAN) 0.65 % SOLN nasal spray Place 1 spray into both nostrils as needed for congestion.  . vitamin B-12 (CYANOCOBALAMIN) 100 MCG tablet Take 1,000 mcg by mouth daily.      Allergies:   Tetracycline and Dilaudid [hydromorphone hcl]   Social History   Socioeconomic History  . Marital status: Widowed    Spouse name: Not on file  . Number of children: Not on file  . Years of education: 53  . Highest education level: Not on file  Occupational History  . 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 Smoking status: Former Smoker    Types: Cigarettes    Quit date: 03/23/1976    Years since quitting: 43.0  . Smokeless tobacco: Never Used  Substance and Sexual Activity  . Alcohol use: No  . Drug use: No  . Sexual activity: Yes    Birth control/protection: Abstinence  Other Topics Concern  . Not on file  Social History Narrative   Caffeine Use-yes   Regular exercise-no         Social Determinants of Health   Financial Resource Strain:   . Difficulty of Paying Living Expenses:   Food Insecurity:   . Worried About Charity fundraiser in the Last Year:   . Arboriculturist in the Last Year:   Transportation Needs:   . Lexicographer (Medical):   Marland Kitchen Lack of Transportation (Non-Medical):   Physical Activity:   . Days of Exercise per Week:   . Minutes of Exercise per Session:   Stress:   . Feeling of Stress :   Social Connections:   . Frequency of Communication with Friends and Family:   . Frequency of Social Gatherings with Friends and Family:   . Attends Religious Services:   . Active Member of Clubs or Organizations:   . Attends Archivist Meetings:   .  Marital Status:      Family History: The patient's family history includes Alzheimer's disease in her mother; Cancer in her maternal grandmother; Colon cancer in her paternal grandfather; Diabetes in her father, maternal grandmother, and mother; Heart attack in her father and paternal grandfather; Heart disease in her father; Hyperlipidemia in her father and mother; Hypertension in her maternal grandmother and mother; Stomach cancer in her maternal grandmother; Stroke in her maternal grandmother and paternal grandmother.  ROS:   Please see the history of present illness.    Other complaints include difficulty sleeping, decreased hearing and buzzing in her right ear.  Both ears but's.  Recent gallbladder and abdominal surgery.  As mentioned above she has shortness of breath and swelling that concerns her.  She uses Lasix to help with fluid control.  She has never been given a diagnosis of heart failure.  States that mitral valve prolapse was diagnosed 25 or 30 years ago.  There is no significant family history of heart disease.  She does state that she had rheumatic fever as a child.  She has difficulty sleeping.  All other systems reviewed and are negative.  EKGs/Labs/Other Studies Reviewed:    The following studies were reviewed today: No recent cardiac evaluation of any type.  EKG:  EKG performed on 04/09/2019 demonstrates P wave abnormality, biatrial.  Nonspecific ST abnormality with borderline prolonged QT interval.  Borderline low  voltage.  Recent Labs: 09/04/2018: TSH 2.97 12/17/2018: ALT 13 02/15/2019: BUN 20; Creatinine, Ser 1.00; Hemoglobin 13.5; Platelets 337.0; Potassium 4.0; Sodium 144  Recent Lipid Panel    Component Value Date/Time   CHOL 213 (H) 10/19/2017 1039   TRIG 98.0 10/19/2017 1039   HDL 75.50 10/19/2017 1039   CHOLHDL 3 10/19/2017 1039   VLDL 19.6 10/19/2017 1039   LDLCALC 117 (H) 10/19/2017 1039   LDLDIRECT 159.0 02/13/2014 1046    Physical Exam:    VS:  BP 102/68   Pulse 86   Ht 5\' 4"  (1.626 m)   Wt 153 lb 12.8 oz (69.8 kg)   SpO2 98%   BMI 26.40 kg/m     Wt Readings from Last 3 Encounters:  04/15/19 153 lb 12.8 oz (69.8 kg)  04/09/19 153 lb 3.2 oz (69.5 kg)  02/15/19 151 lb 3.2 oz (68.6 kg)     GEN: Resolving ecchymosis left thigh. No acute distress HEENT: Normal NECK: No JVD. LYMPHATICS: No lymphadenopathy CARDIAC:  RRR without murmur, gallop, or edema. VASCULAR:  Normal Pulses. No bruits. RESPIRATORY:  Clear to auscultation without rales, wheezing or rhonchi  ABDOMEN: Soft, non-tender, non-distended, No pulsatile mass, MUSCULOSKELETAL: No deformity  SKIN: Warm and dry NEUROLOGIC:  Alert and oriented x 3 PSYCHIATRIC:  Normal affect   ASSESSMENT:    1. Syncope and collapse   2. Dyspnea on exertion   3. Mitral valve disease   4. History of 2019 novel coronavirus disease (COVID-19)    PLAN:    In order of problems listed above:  1. History is highly suggestive of neurally mediated syncope (vasovagal syncope) occurring throughout her life with greater than 5 and less than 10 episodes of true syncope.  Many other separate episodes of prodrome with avoidance of syncope with sitting or lying down.  She is educated about prodrome and the importance of going to a lying or sitting position depending upon her posture at the time of onset.  Cautioned against standing while trying to get to another point if her prodrome starts.  2D Doppler echocardiogram  will be done to assess  structural integrity 2. Doppler echocardiogram will help [place] to sort out whether cardiac etiology for dyspnea exists. 3. Given history of mitral valve prolapse and rheumatic fever, an echo will help determine if there is any mitral valve pathology.  Clinical exam is normal. 4. She has had COVID-19.  We did discuss the vaccine.  She is planning to be vaccinated.  Future follow-up would be dependent upon findings.   Medication Adjustments/Labs and Tests Ordered: Current medicines are reviewed at length with the patient today.  Concerns regarding medicines are outlined above.  Orders Placed This Encounter  Procedures  . ECHOCARDIOGRAM COMPLETE   No orders of the defined types were placed in this encounter.   Patient Instructions  Medication Instructions:  Your physician recommends that you continue on your current medications as directed. Please refer to the Current Medication list given to you today.  *If you need a refill on your cardiac medications before your next appointment, please call your pharmacy*   Lab Work: None If you have labs (blood work) drawn today and your tests are completely normal, you will receive your results only by: Marland Kitchen MyChart Message (if you have MyChart) OR . A paper copy in the mail If you have any lab test that is abnormal or we need to change your treatment, we will call you to review the results.   Testing/Procedures: Your physician has requested that you have an echocardiogram. Echocardiography is a painless test that uses sound waves to create images of your heart. It provides your doctor with information about the size and shape of your heart and how well your heart's chambers and valves are working. This procedure takes approximately one hour. There are no restrictions for this procedure.    Follow-Up: At [facility], you and your health needs are our priority.  As part of our continuing mission to provide you with exceptional heart care, we have  created designated Provider Care Teams.  These Care Teams include your primary Cardiologist (physician) and Advanced Practice Providers (APPs -  Physician Assistants and Nurse Practitioners) who all work together to provide you with the care you need, when you need it.  We recommend signing up for the patient portal called "MyChart".  Sign up information is provided on this After Visit Summary.  MyChart is used to connect with patients for Virtual Visits (Telemedicine).  Patients are able to view lab/test results, encounter notes, upcoming appointments, etc.  Non-urgent messages can be sent to your provider as well.   To learn more about what you can do with MyChart, go to [email or web address].    Your next appointment:   As needed  The format for your next appointment:   In Person  Provider:   You may see Dr. Daneen Schick or one of the following Advanced Practice Providers on your designated Care Team:    Truitt Merle, NP  Cecilie Kicks, NP  Kathyrn Drown, NP    Other Instructions      Signed, Sinclair Grooms, MD  04/15/2019 12:23 PM    [place]

## 2019-04-16 ENCOUNTER — Encounter: Payer: Self-pay | Admitting: Neurology

## 2019-05-02 ENCOUNTER — Ambulatory Visit (HOSPITAL_COMMUNITY): Payer: PPO | Attending: Cardiology

## 2019-05-02 ENCOUNTER — Other Ambulatory Visit: Payer: Self-pay

## 2019-05-02 DIAGNOSIS — R55 Syncope and collapse: Secondary | ICD-10-CM | POA: Diagnosis not present

## 2019-05-15 ENCOUNTER — Encounter: Payer: Self-pay | Admitting: Family Medicine

## 2019-05-16 NOTE — Telephone Encounter (Signed)
Need to see it. Can work her in. It would be for this only.

## 2019-05-16 NOTE — Telephone Encounter (Signed)
Pt called about this and I let her know we are waiting on a response from the doctor, she is aware and said she will wait for a call back

## 2019-05-17 ENCOUNTER — Other Ambulatory Visit: Payer: Self-pay

## 2019-05-17 ENCOUNTER — Ambulatory Visit (INDEPENDENT_AMBULATORY_CARE_PROVIDER_SITE_OTHER): Payer: PPO | Admitting: Family Medicine

## 2019-05-17 ENCOUNTER — Encounter: Payer: Self-pay | Admitting: Family Medicine

## 2019-05-17 VITALS — BP 106/64 | HR 73 | Temp 97.9°F | Ht 64.0 in | Wt 153.8 lb

## 2019-05-17 DIAGNOSIS — W57XXXA Bitten or stung by nonvenomous insect and other nonvenomous arthropods, initial encounter: Secondary | ICD-10-CM | POA: Diagnosis not present

## 2019-05-17 DIAGNOSIS — S70362A Insect bite (nonvenomous), left thigh, initial encounter: Secondary | ICD-10-CM | POA: Diagnosis not present

## 2019-05-17 DIAGNOSIS — S70361A Insect bite (nonvenomous), right thigh, initial encounter: Secondary | ICD-10-CM

## 2019-05-17 MED ORDER — AMOXICILLIN 500 MG PO CAPS: 500.0000 mg | ORAL_CAPSULE | Freq: Three times a day (TID) | ORAL | 0 refills | Status: AC

## 2019-05-17 NOTE — Patient Instructions (Signed)

## 2019-05-17 NOTE — Progress Notes (Signed)
Established Patient Office Visit  Subjective:  Patient ID: Megan Valentine, female    DOB: [birth year]  Age: 69 y.o. MRN: [id number]  CC:  Chief Complaint  Patient presents with  . Acute Visit    tick bites red and swollen little sore.     HPI [name] presents for evaluation of recent tick bites.  Full small tics of her posterior right thigh and anterior left inner thigh 5 days ago.  Mild headache and nausea.  Denies fevers joint or chest pain.  Denies myalgias or arthralgias.  Past Medical History:  Diagnosis Date  . Allergy   . Arthritis   . Cataract   . Chronic bronchitis ([place])   . Complication of anesthesia    hard to wake up  . Diverticulosis   . Environmental allergies   . GERD (gastroesophageal reflux disease)   . Hiatal hernia   . History of chicken pox   . Hyperlipidemia   . Hypothyroidism   . IBS (irritable bowel syndrome)   . Migraines   . Mitral valve prolapse   . Rectal prolapse     Past Surgical History:  Procedure Laterality Date  . ABDOMINAL HYSTERECTOMY  1999  . APPENDECTOMY  1962  . BREAST CYST ASPIRATION Bilateral   . CHOLECYSTECTOMY N/A 12/17/2018   Procedure: LAPAROSCOPIC CHOLECYSTECTOMY;  Surgeon: Clovis Riley, MD;  Location: [place];  Service: General;  Laterality: N/A;  . EYE SURGERY    . JOINT REPLACEMENT     rt knee scope  . REFRACTIVE SURGERY  2000   both eyes  . TOTAL KNEE ARTHROPLASTY Right 03/24/2015   Procedure: TOTAL KNEE ARTHROPLASTY;  Surgeon: [name], MD;  Location: [facility];  Service: Orthopedics;  Laterality: Right;  . TUBAL LIGATION  1977    Family History  Problem Relation Age of Onset  . Stomach cancer Maternal Grandmother   . Hypertension Maternal Grandmother   . Diabetes Maternal Grandmother   . Stroke Maternal Grandmother   . Cancer Maternal Grandmother        Stomach cancer  . Colon cancer Paternal Grandfather   . Heart attack Paternal Grandfather   . Hyperlipidemia Mother   . Diabetes Mother     . Hypertension Mother   . Alzheimer's disease Mother   . Diabetes Father   . Hyperlipidemia Father   . Heart disease Father   . Heart attack Father   . Stroke Paternal Grandmother     Social History   Socioeconomic History  . Marital status: Widowed    Spouse name: Not on file  . Number of children: Not on file  . Years of education: 54  . Highest education level: Not on file  Occupational History  . 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 Smoking status: Former Smoker    Types: Cigarettes    Quit date: 03/23/1976    Years since quitting: 43.1  . Smokeless tobacco: Never Used  Substance and Sexual Activity  . Alcohol use: No  . Drug use: No  . Sexual activity: Yes    Birth control/protection: Abstinence  Other Topics Concern  . Not on file  Social History Narrative   Caffeine Use-yes   Regular exercise-no         Social Determinants of Health   Financial Resource Strain:   . Difficulty of Paying Living Expenses:   Food Insecurity:   . Worried  About Running Out of Food in the Last Year:   . [place] in the Last Year:   Transportation Needs:   . Lack of Transportation (Medical):   Marland Kitchen Lack of Transportation (Non-Medical):   Physical Activity:   . Days of Exercise per Week:   . Minutes of Exercise per Session:   Stress:   . Feeling of Stress :   Social Connections:   . Frequency of Communication with Friends and Family:   . Frequency of Social Gatherings with Friends and Family:   . Attends Religious Services:   . Active Member of Clubs or Organizations:   . Attends Archivist Meetings:   Marland Kitchen Marital Status:   Intimate Partner Violence:   . Fear of Current or Ex-Partner:   . Emotionally Abused:   Marland Kitchen Physically Abused:   . Sexually Abused:     Outpatient Medications Prior to Visit  Medication Sig Dispense Refill  . acetaminophen (TYLENOL) 500 MG tablet Take 1,000-1,500 mg by mouth  every 6 (six) hours as needed for mild pain or headache.    . albuterol (VENTOLIN HFA) 108 (90 Base) MCG/ACT inhaler INHALE 2 PUFFS INTO THE LUNGS EVERY 6 HOURS AS NEEDED FOR WHEEZING OR SHORTNESS OF BREATH 8.5 g 2  . aspirin 81 MG chewable tablet Chew 81 mg daily by mouth.    . Biotin 1 MG CAPS Take 1 mg by mouth daily.    . Brimonidine Tartrate (LUMIFY) 0.025 % SOLN Apply 1 drop to eye 2 (two) times daily. Both eyes    . cholecalciferol (VITAMIN D3) 25 MCG (1000 UT) tablet Take 2,000 Units by mouth daily.     . Cranberry-Vitamin C-Vitamin E (CRANBERRY PLUS VITAMIN C) 4200-20-3 MG-MG-UNIT CAPS Take 2 capsules by mouth daily.    Marland Kitchen docusate sodium (STOOL SOFTENER) 100 MG capsule Take 400 mg by mouth at bedtime.    Marland Kitchen estradiol (ESTRACE) 0.5 MG tablet TAKE 1 TABLET(0.5 MG) BY MOUTH DAILY 90 tablet 2  . ferrous sulfate 325 (65 FE) MG tablet Take 1 tablet (325 mg total) by mouth daily with breakfast. 30 tablet 3  . furosemide (LASIX) 20 MG tablet Take 20 mg by mouth daily.    . hydroxypropyl methylcellulose / hypromellose (ISOPTO TEARS / GONIOVISC) 2.5 % ophthalmic solution Place 1 drop into both eyes 3 (three) times daily as needed for dry eyes.    Marland Kitchen levothyroxine (SYNTHROID) 100 MCG tablet TAKE 1 TABLET BY MOUTH DAILY....NEED ANNUAL FOR REFILLS 90 tablet 2  . pantoprazole (PROTONIX) 20 MG tablet TAKE 1 TABLET(20 MG) BY MOUTH DAILY 90 tablet 1  . potassium chloride SA (KLOR-CON) 20 MEQ tablet TAKE 1 TABLET BY MOUTH DAILY WITH LASIX 90 tablet 0  . Senna-Fennel (NATURAL VEGETABLE LAXATIVE) TABS Take 8 tablets by mouth at bedtime.    . sodium chloride (OCEAN) 0.65 % SOLN nasal spray Place 1 spray into both nostrils as needed for congestion.    . vitamin B-12 (CYANOCOBALAMIN) 100 MCG tablet Take 1,000 mcg by mouth daily.      No facility-administered medications prior to visit.    Allergies  Allergen Reactions  . Tetracycline Hives and Itching  . Dilaudid [Hydromorphone Hcl] Nausea And Vomiting     ROS Review of Systems  Constitutional: Negative for diaphoresis, fatigue, fever and unexpected weight change.  HENT: Positive for postnasal drip.   Eyes: Negative for photophobia and visual disturbance.  Respiratory: Negative.   Cardiovascular: Negative.   Gastrointestinal: Positive for nausea. Negative  for abdominal pain and vomiting.  Endocrine: Negative for polyphagia and polyuria.  Genitourinary: Negative for decreased urine volume and frequency.  Musculoskeletal: Negative for arthralgias and myalgias.  Neurological: Positive for headaches.  Psychiatric/Behavioral: Negative.       Objective:    Physical Exam  Constitutional: She is oriented to person, place, and time. She appears well-developed and well-nourished. No distress.  HENT:  Head: Normocephalic and atraumatic.  Right Ear: External ear normal.  Left Ear: External ear normal.  Eyes: Conjunctivae are normal. Right eye exhibits no discharge. Left eye exhibits no discharge. No scleral icterus.  Neck: No JVD present. No tracheal deviation present.  Cardiovascular: Regular rhythm.  Pulmonary/Chest: No stridor.  Neurological: She is alert and oriented to person, place, and time.  Skin: Skin is warm and dry. She is not diaphoretic.     Psychiatric: She has a normal mood and affect. Her behavior is normal.    BP 106/64   Pulse 73   Temp 97.9 F (36.6 C) (Tympanic)   Ht 5\' 4"  (1.626 m)   Wt 153 lb 12.8 oz (69.8 kg)   SpO2 98%   BMI 26.40 kg/m  Wt Readings from Last 3 Encounters:  05/17/19 153 lb 12.8 oz (69.8 kg)  04/15/19 153 lb 12.8 oz (69.8 kg)  04/09/19 153 lb 3.2 oz (69.5 kg)     There are no preventive care reminders to display for this patient.  There are no preventive care reminders to display for this patient.  Lab Results  Component Value Date   TSH 2.97 09/04/2018   Lab Results  Component Value Date   WBC 6.0 02/15/2019   HGB 13.5 02/15/2019   HCT 40.8 02/15/2019   MCV 95.1  02/15/2019   PLT 337.0 02/15/2019   Lab Results  Component Value Date   NA 144 02/15/2019   K 4.0 02/15/2019   CO2 27 02/15/2019   GLUCOSE 118 (H) 02/15/2019   BUN 20 02/15/2019   CREATININE 1.00 02/15/2019   BILITOT 1.0 12/17/2018   ALKPHOS 47 12/17/2018   AST 13 (L) 12/17/2018   ALT 13 12/17/2018   PROT 4.9 (L) 12/17/2018   ALBUMIN 2.6 (L) 12/17/2018   CALCIUM 9.4 02/15/2019   ANIONGAP 5 12/17/2018   GFR 54.99 (L) 02/15/2019   Lab Results  Component Value Date   CHOL 213 (H) 10/19/2017   Lab Results  Component Value Date   HDL 75.50 10/19/2017   Lab Results  Component Value Date   LDLCALC 117 (H) 10/19/2017   Lab Results  Component Value Date   TRIG 98.0 10/19/2017   Lab Results  Component Value Date   CHOLHDL 3 10/19/2017   No results found for: HGBA1C    Assessment & Plan:   Problem List Items Addressed This Visit      Musculoskeletal and Integument   Tick bite - Primary   Relevant Medications   amoxicillin (AMOXIL) 500 MG capsule      Meds ordered this encounter  Medications  . amoxicillin (AMOXIL) 500 MG capsule    Sig: Take 1 capsule (500 mg total) by mouth 3 (three) times daily for 14 days.    Dispense:  42 capsule    Refill:  0    Follow-up: No follow-ups on file.    Libby Maw, MD

## 2019-05-23 ENCOUNTER — Telehealth: Payer: Self-pay | Admitting: Family Medicine

## 2019-05-23 DIAGNOSIS — E039 Hypothyroidism, unspecified: Secondary | ICD-10-CM

## 2019-05-23 DIAGNOSIS — N1831 Chronic kidney disease, stage 3a: Secondary | ICD-10-CM

## 2019-05-23 NOTE — Progress Notes (Signed)
°  Chronic Care Management   Note  05/23/2019 Name: Megan Valentine MRN: [id number] DOB: 1950/06/26  [name] is a 69 y.o. year old female who is a primary care patient of Libby Maw, MD. I reached out to [name] by phone today in response to a referral sent by Ms. Braeleigh B Cordts's PCP, Libby Maw, MD.   [name] was given information about Chronic Care Management services today including:  1. CCM service includes personalized support from designated clinical staff supervised by her physician, including individualized plan of care and coordination with other care providers 2. 24/7 contact phone numbers for assistance for urgent and routine care needs. 3. Service will only be billed when office clinical staff spend 20 minutes or more in a month to coordinate care. 4. Only one practitioner may furnish and bill the service in a calendar month. 5. The patient may stop CCM services at any time (effective at the end of the month) by phone call to the office staff.   Patient agreed to services and verbal consent obtained.   This note is not being shared with the patient for the following reason: To respect privacy (The patient or proxy has requested that the information not be shared).  Follow up plan:   Earney [place] [employer] Scheduler

## 2019-06-05 ENCOUNTER — Ambulatory Visit: Payer: PPO | Admitting: *Deleted

## 2019-06-07 NOTE — Addendum Note (Signed)
Addended by: Lynda Rainwater on: 06/07/2019 04:28 PM   Modules accepted: Orders

## 2019-06-12 NOTE — Chronic Care Management (AMB) (Signed)
Chronic Care Management Pharmacy  Name: Megan Valentine  MRN: [id number] DOB: 1950/06/13  Chief Complaint/ HPI  Megan Valentine,  69 y.o. , female presents for their Initial CCM visit with the clinical pharmacist via telephone due to COVID-19 Pandemic.  PCP : Libby Maw, MD  Their chronic conditions include: Hypothyroidism, chronic kidney disease, hyperlipidemia, GERD   Office Visits: 05/17/19: Patient presented to Dr. Ethelene Hal for tick-bite. Tick bite 5 days prior, associated with redness, swollen, soreness. Patient started on amoxicillin 500 mg TID for 14 days.  04/09/19: Patient presented to [facility] for syncope. Patient referred to cardiology.  02/15/19: Patient presented to Dr. Ethelene Hal for Hypokalemia follow-up. Hypokalemia improved. No medication changes made. 01/04/19: Patient presented to Dr. Ethelene Hal for Hypokalemia follow-up. Hypokalemia improved. No medication changes made. Consult Visit: 04/15/19: Patient referred to Dr. Tamala Julian (Cardiology) for syncope with injury to forehead and eye. ECHO was normal. No medication changes made.  01/16/19: Patient presented to Dr. Eulas Post (Ophthalmology) for follow-up.   Medications: Outpatient Encounter Medications as of 06/13/2019  Medication Sig  . acetaminophen (TYLENOL) 500 MG tablet Take 1,000-1,500 mg by mouth every 6 (six) hours as needed for mild pain or headache.  . albuterol (VENTOLIN HFA) 108 (90 Base) MCG/ACT inhaler INHALE 2 PUFFS INTO THE LUNGS EVERY 6 HOURS AS NEEDED FOR WHEEZING OR SHORTNESS OF BREATH  . aspirin 81 MG chewable tablet Chew 81 mg daily by mouth.  . Biotin 1 MG CAPS Take 1 mg by mouth daily.  . Brimonidine Tartrate (LUMIFY) 0.025 % SOLN Apply 1 drop to eye 2 (two) times daily. Both eyes  . cholecalciferol (VITAMIN D3) 25 MCG (1000 UT) tablet Take 2,000 Units by mouth daily.   . Cranberry-Vitamin C-Vitamin E (CRANBERRY PLUS VITAMIN C) 4200-20-3 MG-MG-UNIT CAPS Take 2 capsules by mouth daily.  Marland Kitchen docusate  sodium (STOOL SOFTENER) 100 MG capsule Take 400 mg by mouth at bedtime.  Marland Kitchen estradiol (ESTRACE) 0.5 MG tablet TAKE 1 TABLET(0.5 MG) BY MOUTH DAILY  . ferrous sulfate 325 (65 FE) MG tablet Take 1 tablet (325 mg total) by mouth daily with breakfast.  . furosemide (LASIX) 20 MG tablet Take 20 mg by mouth daily.  . hydroxypropyl methylcellulose / hypromellose (ISOPTO TEARS / GONIOVISC) 2.5 % ophthalmic solution Place 1 drop into both eyes 3 (three) times daily as needed for dry eyes.  Marland Kitchen levothyroxine (SYNTHROID) 100 MCG tablet TAKE 1 TABLET BY MOUTH DAILY....NEED ANNUAL FOR REFILLS  . pantoprazole (PROTONIX) 20 MG tablet TAKE 1 TABLET(20 MG) BY MOUTH DAILY  . potassium chloride SA (KLOR-CON) 20 MEQ tablet TAKE 1 TABLET BY MOUTH DAILY WITH LASIX  . Senna-Fennel (NATURAL VEGETABLE LAXATIVE) TABS Take 8 tablets by mouth at bedtime.  . sodium chloride (OCEAN) 0.65 % SOLN nasal spray Place 1 spray into both nostrils as needed for congestion.  . vitamin B-12 (CYANOCOBALAMIN) 100 MCG tablet Take 1,000 mcg by mouth daily.    No facility-administered encounter medications on file as of 06/13/2019.   Current Diagnosis/Assessment:  Goals Addressed   None    Hyperlipidemia   Lipid Panel     Component Value Date/Time   CHOL 213 (H) 10/19/2017 1039   TRIG 98.0 10/19/2017 1039   HDL 75.50 10/19/2017 1039   CHOLHDL 3 10/19/2017 1039   VLDL 19.6 10/19/2017 1039   LDLCALC 117 (H) 10/19/2017 1039   LDLDIRECT 159.0 02/13/2014 1046     The 10-year ASCVD risk score Mikey Bussing DC Jr., et al., 2013) is: 7.5%  Values used to calculate the score:     Age: 64 years     Sex: Female     Is Non-Hispanic African American: No     Diabetic: No     Tobacco smoker: No     Systolic Blood Pressure: [id number] mmHg     Is BP treated: Yes     HDL Cholesterol: 75.5 mg/dL     Total Cholesterol: 213 mg/dL   Patient has failed these meds in past: states she has tried three statins in the past, but had intolerable  myalgias Patient is currently uncontrolled on the following medications:   Aspirin 81 mg daily   We discussed:  diet and exercise extensively. Primarily eats chicken. Will eat hamburgers, pork chops, steak on occasion.   Plan  Recommend follow-up lipid panel  Recommend strict adherence to DASH or Mediterranean diet   Hypothyroidism   TSH  Date Value Ref Range Status  09/04/2018 2.97 0.35 - 4.50 uIU/mL Final     Patient has failed these meds in past: n/a Patient is currently controlled on the following medications:   Levothyroxine 100 mcg daily   We discussed:  diet and exercise extensively. Separates levothyroxine from other medications by at least 30 minutes.   Plan  Continue current medications  GERD   Patient has failed these meds in past: n/a Patient is currently controlled on the following medications:   Pantoprazole 20 mg daily   We discussed:  Will occassionally have symptoms. Describes triggers as caffeine and spicy food.   Plan  Continue current medications  Osteoarthritis   Patient has failed these meds in past: n/a Patient is currently managed on the following medications:   Tylenol Extra Strength 1500 mg daily PRN We discussed: patient had right knee replaced 2018, has ongoing problems with her left knee but is hesitant to have surgery. Reports tylenol provides modest relief on days when she is more active.   Plan  Recommend limiting tylenol to no more than 1000 mg at one time and 3000 mg daily.   Allergic Rhinitis   Patient has failed these meds in past: antihistamines (dried her out) Patient is currently controlled on the following medications:   Albuterol HFA 108 mcg/act 2 puffs q6hr PRN (hasn't needed recently)   Fluticasone 50 mcg/act 1 spray daily   Sodium chloride 0.65% nasal spray PRN congestion   Lumify 0.025% 1 drop BID   Isopto Tears 2.5 % 1 drop TID PRN dry eyes We discussed:  Symptoms much better controlled this year compared to  previous years.   Plan  Continue current medications   Misc/OTC   Hair skin and nails 3 tab daily  Vitamin D3 5000 units daily  Cranberry + Vit C 2 caps daily  Estradiol 0.5 mg daily - has been unsuccessful at stopping  Ferrous sulfare 325 mg 1/2 tablet on Mon/Wed/Fri (constipation with higher doses)   Furosemide 20 mg daily - peripheral edema  Potassium chloride 20 mEq daily  Senna-Fennel 8 tabs QHS  Vitamin B12 2500 mcg daily   Plan  Recommend follow-up B12 level.   Vaccines   Reviewed and discussed patient's vaccination history.    Immunization History  Administered Date(s) Administered  . Fluad Quad(high Dose 65+) 10/05/2018  . Influenza Whole 10/09/2015  . Influenza, High Dose Seasonal PF 10/19/2016, 10/19/2017  . Influenza,inj,Quad PF,6+ Mos 11/07/2013, 11/04/2014  . Influenza-Unspecified 10/25/2010  . Moderna SARS-COVID-2 Vaccination 03/07/2019, 04/05/2019  . Pneumococcal Conjugate-13 06/08/2014  . Pneumococcal Polysaccharide-23 07/09/2015  .  Tdap 11/25/2002, 03/24/2013  . Zoster 08/08/2015  . Zoster Recombinat (Shingrix) 10/24/2016, 12/30/2016   Medication Management   Pt uses [facility] for all medications. No >5 fill gap noted on external fill history.  Uses pill box? Yes Pt endorses 100% compliance  Plan  Continue current medication management strategy  Follow up: 6 month phone visit  [place] [facility]  [phone]

## 2019-06-13 ENCOUNTER — Ambulatory Visit: Payer: PPO

## 2019-06-13 DIAGNOSIS — E782 Mixed hyperlipidemia: Secondary | ICD-10-CM

## 2019-06-13 DIAGNOSIS — E039 Hypothyroidism, unspecified: Secondary | ICD-10-CM

## 2019-06-13 NOTE — Patient Instructions (Addendum)
Visit Information It was great speaking with you today!  Please let me know if you have any questions about our visit.  Goals Addressed            This Visit's Progress   . Chronic Care Management       CARE PLAN ENTRY  Current Barriers:  . Chronic Disease Management support, education, and care coordination needs related to Hyperlipidemia, Chronic Kidney Disease, and Hypothyroidism   Hyperlipidemia . Pharmacist Clinical Goal(s): o Over the next 180 days, patient will work with PharmD and providers to achieve LDL goal < 100 . Current regimen:  o None . Interventions: o Recommend increasing physical activity slowly aiming for 150 minutes of moderate-intensity exercise weekly o Recommend following the DASH or Mediterranean diet  Hypothyroidism . Pharmacist Clinical Goal(s) o Over the next 180 days, patient will work with PharmD and providers to maintain stable thyroid function . Current regimen:  o Levothyroxine 100 mcg . Patient self care activities - Over the next 180 days, patient will: o Continue to separate levothyroxine from food and other medications by at least 30 minutes  o Separate levothyroxine from iron or calcium supplements by at least 4 hours   Medication management . Pharmacist Clinical Goal(s): o Over the next 180 days, patient will work with PharmD and providers to maintain optimal medication adherence . Current pharmacy: [facility] . Interventions o Comprehensive medication review performed. o Continue current medication management strategy . Patient self care activities - Over the next 180 days, patient will: o Take medications as prescribed o Report any questions or concerns to PharmD and/or provider(s)       Megan Valentine was given information about Chronic Care Management services today including:  1. CCM service includes personalized support from designated clinical staff supervised by her physician, including individualized plan of care and  coordination with other care providers 2. 24/7 contact phone numbers for assistance for urgent and routine care needs. 3. Standard insurance, coinsurance, copays and deductibles apply for chronic care management only during months in which we provide at least 20 minutes of these services. Most insurances cover these services at 100%, however patients may be responsible for any copay, coinsurance and/or deductible if applicable. This service may help you avoid the need for more expensive face-to-face services. 4. Only one practitioner may furnish and bill the service in a calendar month. 5. The patient may stop CCM services at any time (effective at the end of the month) by phone call to the office staff.  Patient agreed to services and verbal consent obtained.   The patient verbalized understanding of instructions provided today and agreed to receive a mailed copy of patient instruction and/or educational materials. Telephone follow up appointment with pharmacy team member scheduled for: 12/12/19 at 10:00 AM  [place] [facility] [place]  [place] refers to food and lifestyle choices that are based on the traditions of countries located on the [place]. This way of eating has been shown to help prevent certain conditions and improve outcomes for people who have chronic diseases, like kidney disease and heart disease. What are tips for following this plan? Lifestyle  Cook and eat meals together with your family, when possible.  Drink enough fluid to keep your urine clear or pale yellow.  Be physically active every day. This includes: ? Aerobic exercise like running or swimming. ? Leisure activities like gardening, walking, or housework.  Get 7-8 hours of  sleep each night.  If recommended by your health care provider, drink red wine in moderation. This means 1 glass a day for nonpregnant women  and 2 glasses a day for men. A glass of wine equals 5 oz (150 mL). Reading food labels   Check the serving size of packaged foods. For foods such as rice and pasta, the serving size refers to the amount of cooked product, not dry.  Check the total fat in packaged foods. Avoid foods that have saturated fat or trans fats.  Check the ingredients list for added sugars, such as corn syrup. Shopping  At the grocery store, buy most of your food from the areas near the walls of the store. This includes: ? Fresh fruits and vegetables (produce). ? Grains, beans, nuts, and seeds. Some of these may be available in unpackaged forms or large amounts (in bulk). ? Fresh seafood. ? Poultry and eggs. ? Low-fat dairy products.  Buy whole ingredients instead of prepackaged foods.  Buy fresh fruits and vegetables in-season from local farmers markets.  Buy frozen fruits and vegetables in resealable bags.  If you do not have access to quality fresh seafood, buy precooked frozen shrimp or canned fish, such as tuna, salmon, or sardines.  Buy small amounts of raw or cooked vegetables, salads, or olives from the deli or salad bar at your store.  Stock your pantry so you always have certain foods on hand, such as olive oil, canned tuna, canned tomatoes, rice, pasta, and beans. Cooking  Cook foods with extra-virgin olive oil instead of using butter or other vegetable oils.  Have meat as a side dish, and have vegetables or grains as your main dish. This means having meat in small portions or adding small amounts of meat to foods like pasta or stew.  Use beans or vegetables instead of meat in common dishes like chili or lasagna.  Experiment with different cooking methods. Try roasting or broiling vegetables instead of steaming or sauteing them.  Add frozen vegetables to soups, stews, pasta, or rice.  Add nuts or seeds for added healthy fat at each meal. You can add these to yogurt, salads, or vegetable  dishes.  Marinate fish or vegetables using olive oil, lemon juice, garlic, and fresh herbs. Meal planning   Plan to eat 1 vegetarian meal one day each week. Try to work up to 2 vegetarian meals, if possible.  Eat seafood 2 or more times a week.  Have healthy snacks readily available, such as: ? Vegetable sticks with hummus. ? [place] yogurt. ? Fruit and nut trail mix.  Eat balanced meals throughout the week. This includes: ? Fruit: 2-3 servings a day ? Vegetables: 4-5 servings a day ? Low-fat dairy: 2 servings a day ? Fish, poultry, or lean meat: 1 serving a day ? Beans and legumes: 2 or more servings a week ? Nuts and seeds: 1-2 servings a day ? Whole grains: 6-8 servings a day ? Extra-virgin olive oil: 3-4 servings a day  Limit red meat and sweets to only a few servings a month What are my food choices?  Mediterranean diet ? Recommended  Grains: Whole-grain pasta. Brown rice. Bulgar wheat. Polenta. Couscous. Whole-wheat bread. [place].  Vegetables: Artichokes. Beets. Broccoli. Cabbage. Carrots. Eggplant. Green beans. Chard. Kale. Spinach. Onions. Leeks. Peas. Squash. Tomatoes. Peppers. Radishes.  Fruits: Apples. Apricots. Avocado. Berries. Bananas. Cherries. Dates. Figs. Grapes. Lemons. Melon. Oranges. Peaches. Plums. Pomegranate.  Meats and other protein foods: Beans. Almonds. Sunflower seeds. Pine nuts.  Peanuts. [place]. Salmon. Scallops. Shrimp. [place]. Tilapia. Clams. Oysters. Eggs.  Dairy: Low-fat milk. Cheese. Greek yogurt.  Beverages: Water. Red wine. Herbal tea.  Fats and oils: Extra virgin olive oil. Avocado oil. Grape seed oil.  Sweets and desserts: [place] yogurt with honey. Baked apples. Poached pears. Trail mix.  Seasoning and other foods: Basil. Cilantro. Coriander. Cumin. Mint. Parsley. Sage. Rosemary. Tarragon. Garlic. Oregano. Thyme. Pepper. Balsalmic vinegar. Tahini. Hummus. Tomato sauce. Olives. Mushrooms. ? Limit these  Grains: Prepackaged pasta  or rice dishes. Prepackaged cereal with added sugar.  Vegetables: Deep fried potatoes (french fries).  Fruits: Fruit canned in syrup.  Meats and other protein foods: Beef. Pork. Lamb. Poultry with skin. Hot dogs. Berniece Salines.  Dairy: Ice cream. Sour cream. Whole milk.  Beverages: Juice. Sugar-sweetened soft drinks. Beer. Liquor and spirits.  Fats and oils: Butter. Canola oil. Vegetable oil. Beef fat (tallow). Lard.  Sweets and desserts: Cookies. Cakes. Pies. Candy.  Seasoning and other foods: Mayonnaise. Premade sauces and marinades. The items listed may not be a complete list. Talk with your dietitian about what dietary choices are right for you. Summary  The Mediterranean diet includes both food and lifestyle choices.  Eat a variety of fresh fruits and vegetables, beans, nuts, seeds, and whole grains.  Limit the amount of red meat and sweets that you eat.  Talk with your health care provider about whether it is safe for you to drink red wine in moderation. This means 1 glass a day for nonpregnant women and 2 glasses a day for men. A glass of wine equals 5 oz (150 mL). This information is not intended to replace advice given to you by your health care provider. Make sure you discuss any questions you have with your health care provider. Document Revised: 09/10/2015 Document Reviewed: 09/03/2015 Elsevier Patient Education  [place].

## 2019-06-23 ENCOUNTER — Other Ambulatory Visit: Payer: Self-pay | Admitting: Family Medicine

## 2019-06-23 DIAGNOSIS — E876 Hypokalemia: Secondary | ICD-10-CM

## 2019-06-26 ENCOUNTER — Encounter: Payer: Self-pay | Admitting: Family Medicine

## 2019-06-27 ENCOUNTER — Other Ambulatory Visit: Payer: Self-pay | Admitting: Family Medicine

## 2019-06-27 DIAGNOSIS — E039 Hypothyroidism, unspecified: Secondary | ICD-10-CM

## 2019-06-27 NOTE — Telephone Encounter (Signed)
Need to see her?

## 2019-06-28 ENCOUNTER — Other Ambulatory Visit: Payer: Self-pay

## 2019-06-28 ENCOUNTER — Telehealth (INDEPENDENT_AMBULATORY_CARE_PROVIDER_SITE_OTHER): Payer: PPO | Admitting: Nurse Practitioner

## 2019-06-28 ENCOUNTER — Encounter: Payer: Self-pay | Admitting: Nurse Practitioner

## 2019-06-28 VITALS — Ht 64.0 in | Wt 150.0 lb

## 2019-06-28 DIAGNOSIS — L247 Irritant contact dermatitis due to plants, except food: Secondary | ICD-10-CM

## 2019-06-28 MED ORDER — PREDNISONE 10 MG PO TABS: ORAL_TABLET | ORAL | 0 refills | Status: DC

## 2019-06-28 NOTE — Progress Notes (Signed)
Virtual Visit via Video Note  I connected with@ on 06/28/19 at 11:00 AM EDT by a video enabled telemedicine application and verified that I am speaking with the correct person using two identifiers.  Location: Patient:Home Provider: Office Participants: patient and provider  I discussed the limitations of evaluation and management by telemedicine and the availability of in person appointments. I also discussed with the patient that there may be a patient responsible charge related to this service. The patient expressed understanding and agreed to proceed.  CC:pt stated she was working in yard end of last week and started itching//pt states poison ivy or oak is on both her arm but worse on her left arm and both legs but more so on the right//pt used Ivy rest cream for poison ivy and a gel but it doesn't seem to work  History of Present Illness: Poison Ivy This is a new problem. The current episode started in the past 7 days. The problem has been gradually worsening since onset. The affected locations include the left arm, torso, right arm, left lower leg and right lower leg. The rash is characterized by itchiness and redness. She was exposed to plant contact. Pertinent negatives include no cough, fatigue, fever, joint pain, shortness of breath or sore throat. Past treatments include anti-itch cream and moisturizer. The treatment provided no relief.   Observations/Objective: Physical Exam  Constitutional: She is oriented to person, place, and time. No distress.  Pulmonary/Chest: Effort normal.  Musculoskeletal:        General: No edema.  Neurological: She is alert and oriented to person, place, and time.  Skin: Rash noted. Rash is papular and vesicular. There is erythema.    Assessment and Plan: Berdina was seen today for poison ivy.  Diagnoses and all orders for this visit:  Irritant contact dermatitis due to plants, except food -     predniSONE (DELTASONE) 10 MG tablet; Take 4tabs  once a day x 2days, then 3tabs once a dayx 2days, then 2tabs once a day x3days, then, 1tab once a day x 3days, then stop   Follow Up Instructions: Avoid sunlight exposure Take cool to cold showers. Use caladryl gel as needed for itching Start oral prednisone, take with food.  I discussed the assessment and treatment plan with the patient. The patient was provided an opportunity to ask questions and all were answered. The patient agreed with the plan and demonstrated an understanding of the instructions.   The patient was advised to call back or seek an in-person evaluation if the symptoms worsen or if the condition fails to improve as anticipated.    Wilfred Lacy, NP

## 2019-06-28 NOTE — Patient Instructions (Signed)
Avoid sunlight exposure Take cool to cold showers. Use caladryl gel as needed for itching Start oral prednisone, take with food.   Poison Ivy Dermatitis Poison ivy dermatitis is redness and soreness of the skin caused by chemicals in the leaves of the poison ivy plant. You may have very bad itching, swelling, a rash, and blisters. What are the causes?  Touching a poison ivy plant.  Touching something that has the chemical on it. This may include animals or objects that have come in contact with the plant. What increases the risk?  Going outdoors often in wooded or [place] areas.  Going outdoors without wearing protective clothing, such as closed shoes, long pants, and a long-sleeved shirt. What are the signs or symptoms?   Skin redness.  Very bad itching.  A rash that often includes bumps and blisters. ? The rash usually appears 48 hours after exposure, if you have been exposed before. ? If this is the first time you have been exposed, the rash may not appear until a week after exposure.  Swelling. This may occur if the reaction is very bad. Symptoms usually last for 1-2 weeks. The first time you develop this condition, symptoms may last 3-4 weeks. How is this treated? This condition may be treated with:  Hydrocortisone cream or calamine lotion to relieve itching.  Oatmeal baths to soothe the skin.  Medicines, such as over-the-counter antihistamine tablets.  Oral steroid medicine for more severe reactions. Follow these instructions at home: Medicines  Take or apply over-the-counter and prescription medicines only as told by your doctor.  Use hydrocortisone cream or calamine lotion as needed to help with itching. General instructions  Do not scratch or rub your skin.  Put a cold, wet cloth (cold compress) on the affected areas or take baths in cool water. This will help with itching.  Avoid hot baths and showers.  Take oatmeal baths as needed. Use colloidal  oatmeal. You can get this at a pharmacy or grocery store. Follow the instructions on the package.  While you have the rash, wash your clothes right after you wear them.  Keep all follow-up visits as told by your health care provider. This is important. How is this prevented?   Know what poison ivy looks like, so you can avoid it. ? This plant has three leaves with flowering branches on a single stem. ? The leaves are glossy. ? The leaves have uneven edges that come to a point at the front.  If you touch poison ivy, wash your skin with soap and water right away. Be sure to wash under your fingernails.  When hiking or camping, wear long pants, a long-sleeved shirt, tall socks, and hiking boots. You can also use a lotion on your skin that helps to prevent contact with poison ivy.  If you think that your clothes or outdoor gear came in contact with poison ivy, rinse them off with a garden hose before you bring them inside your house.  When doing yard work or gardening, wear gloves, long sleeves, long pants, and boots. Wash your garden tools and gloves if they come in contact with poison ivy.  If you think that your pet has come into contact with poison ivy, wash him or her with pet shampoo and water. Make sure to wear gloves while washing your pet. Contact a doctor if:  You have open sores in the rash area.  You have more redness, swelling, or pain in the rash area.  You have  redness that spreads beyond the rash area.  You have fluid, blood, or pus coming from the rash area.  You have a fever.  You have a rash over a large area of your body.  You have a rash on your eyes, mouth, or genitals.  Your rash does not get better after a few weeks. Get help right away if:  Your face swells or your eyes swell shut.  You have trouble breathing.  You have trouble swallowing. These symptoms may be an emergency. Do not wait to see if the symptoms will go away. Get medical help right  away. Call your local emergency services (911 in the [place] Do not drive yourself to the hospital. Summary  Poison ivy dermatitis is redness and soreness of the skin caused by chemicals in the leaves of the poison ivy plant.  You may have skin redness, very bad itching, swelling, and a rash.  Do not scratch or rub your skin.  Take or apply over-the-counter and prescription medicines only as told by your doctor. This information is not intended to replace advice given to you by your health care provider. Make sure you discuss any questions you have with your health care provider. Document Revised: 05/04/2018 Document Reviewed: 01/05/2018 Elsevier Patient Education  2020 [employer].

## 2019-06-29 ENCOUNTER — Other Ambulatory Visit: Payer: Self-pay | Admitting: Family Medicine

## 2019-07-11 ENCOUNTER — Other Ambulatory Visit: Payer: Self-pay

## 2019-07-11 ENCOUNTER — Encounter: Payer: Self-pay | Admitting: Neurology

## 2019-07-11 ENCOUNTER — Other Ambulatory Visit: Payer: PPO

## 2019-07-11 ENCOUNTER — Ambulatory Visit: Payer: PPO | Admitting: Neurology

## 2019-07-11 VITALS — BP 115/74 | HR 82 | Ht 64.0 in | Wt 154.2 lb

## 2019-07-11 DIAGNOSIS — R55 Syncope and collapse: Secondary | ICD-10-CM | POA: Diagnosis not present

## 2019-07-11 LAB — SEDIMENTATION RATE: Sed Rate: 4 mm/hr (ref 0–30)

## 2019-07-11 LAB — VITAMIN B12: Vitamin B-12: >1526 pg/mL — ABNORMAL HIGH (ref 211–911)

## 2019-07-11 LAB — C-REACTIVE PROTEIN: CRP: <1 mg/dL (ref 0.5–20.0)

## 2019-07-11 NOTE — Patient Instructions (Addendum)
Good to see you again. Please schedule EEG, our office will call with results. If normal, follow-up as needed. Bloodwork for neuropathy will be ordered, we will send results to your PCP. Take care!
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
## 2019-07-12 LAB — ANA: Anti Nuclear Antibody (ANA): NEGATIVE

## 2019-07-15 ENCOUNTER — Telehealth: Payer: Self-pay

## 2019-07-15 NOTE — Telephone Encounter (Signed)
-----   Message from Cameron Sprang, MD sent at 07/14/2019  5:43 PM EDT ----- Pls let her know bloodwork normal, thanks

## 2019-07-15 NOTE — Telephone Encounter (Signed)
Pt called informed that Per Dr Delice Lesch lab work was normal pt asking about her B12 level,  And asking for a call back., Dr Delice Lesch asked about the B12 and I will call the pt back.  PT called back Per Dr Delice Lesch it is okay if she stops it. The main thing is it is not low. Pt verbalized understanding

## 2019-07-17 DIAGNOSIS — Z961 Presence of intraocular lens: Secondary | ICD-10-CM | POA: Diagnosis not present

## 2019-07-17 DIAGNOSIS — H40013 Open angle with borderline findings, low risk, bilateral: Secondary | ICD-10-CM | POA: Diagnosis not present

## 2019-07-17 DIAGNOSIS — H16223 Keratoconjunctivitis sicca, not specified as Sjogren's, bilateral: Secondary | ICD-10-CM | POA: Diagnosis not present

## 2019-07-17 DIAGNOSIS — H524 Presbyopia: Secondary | ICD-10-CM | POA: Diagnosis not present

## 2019-07-18 ENCOUNTER — Other Ambulatory Visit: Payer: Self-pay

## 2019-07-18 ENCOUNTER — Ambulatory Visit: Payer: PPO | Admitting: Neurology

## 2019-07-18 DIAGNOSIS — R55 Syncope and collapse: Secondary | ICD-10-CM | POA: Diagnosis not present

## 2019-07-23 NOTE — Procedures (Signed)
ELECTROENCEPHALOGRAM REPORT  Date of Study: 07/18/2019  Patient's Name: Megan Valentine MRN: [id number] Date of Birth: 1950/10/08  Referring Provider: Dr. Ellouise Newer  Clinical History: This is a 69 year old woman with an episode of syncope with prolonged loss of consciousness. EEG for classification.  Medications: TYLENOL 500 MG tablet VENTOLIN HFA 108 (90 Base) MCG/ACT inhaler aspirin 81 MG chewable tablet Biotin 1 MG CAPS LUMIFY 0.025 % SOLN VITAMIN D 125 MCG (5000 UT) CAPS CRANBERRY PLUS VITAMIN C 4200-20-3 MG-MG-UNIT CAPS ESTRACE 0.5 MG tablet 65 FE MG tablet FLONASE 50 MCG/ACT nasal spray LASIX 20 MG tablet SYNTHROID 100 MCG tablet PROTONIX 20 MG tablet KLOR-CON 20 MEQ tablet NATURAL VEGETABLE LAXATIVE TABS OCEAN 0.65 % SOLN nasal spray CYANOCOBALAMIN 100 MCG tablet    Technical Summary: A multichannel digital EEG recording measured by the international 10-20 system with electrodes applied with paste and impedances below 5000 ohms performed in our laboratory with EKG monitoring in an awake and asleep patient.  Hyperventilation was not performed. Photic stimulation was performed.  The digital EEG was referentially recorded, reformatted, and digitally filtered in a variety of bipolar and referential montages for optimal display.    Description: The patient is awake and asleep during the recording.  During maximal wakefulness, there is a symmetric, medium voltage 9-10 Hz posterior dominant rhythm that attenuates with eye opening.  The record is symmetric.  During drowsiness and sleep, there is an increase in theta slowing of the background.  Vertex waves and symmetric sleep spindles were seen.  Photic stimulation did not elicit any abnormalities.  There were no epileptiform discharges or electrographic seizures seen.    EKG lead was unremarkable.  Impression: This awake and asleep EEG is normal.    Clinical Correlation: A normal EEG does not exclude a clinical diagnosis  of epilepsy.  If further clinical questions remain, prolonged EEG may be helpful.  Clinical correlation is advised.   Ellouise Newer, M.D.

## 2019-07-23 NOTE — Progress Notes (Signed)
Pt called and informed that EEG was normal, she had no further questions.

## 2019-07-26 ENCOUNTER — Ambulatory Visit: Payer: PPO | Admitting: Neurology

## 2019-08-14 ENCOUNTER — Other Ambulatory Visit: Payer: Self-pay

## 2019-08-15 ENCOUNTER — Telehealth: Payer: Self-pay

## 2019-08-15 ENCOUNTER — Encounter: Payer: Self-pay | Admitting: Family Medicine

## 2019-08-15 ENCOUNTER — Ambulatory Visit (INDEPENDENT_AMBULATORY_CARE_PROVIDER_SITE_OTHER): Payer: PPO | Admitting: Family Medicine

## 2019-08-15 ENCOUNTER — Ambulatory Visit (INDEPENDENT_AMBULATORY_CARE_PROVIDER_SITE_OTHER): Payer: PPO

## 2019-08-15 ENCOUNTER — Ambulatory Visit: Payer: PPO | Admitting: Orthopaedic Surgery

## 2019-08-15 ENCOUNTER — Encounter: Payer: Self-pay | Admitting: Orthopaedic Surgery

## 2019-08-15 VITALS — BP 100/60 | HR 70 | Temp 97.9°F | Ht 64.0 in | Wt 153.1 lb

## 2019-08-15 DIAGNOSIS — G8929 Other chronic pain: Secondary | ICD-10-CM

## 2019-08-15 DIAGNOSIS — E538 Deficiency of other specified B group vitamins: Secondary | ICD-10-CM | POA: Insufficient documentation

## 2019-08-15 DIAGNOSIS — M1712 Unilateral primary osteoarthritis, left knee: Secondary | ICD-10-CM

## 2019-08-15 DIAGNOSIS — E039 Hypothyroidism, unspecified: Secondary | ICD-10-CM | POA: Diagnosis not present

## 2019-08-15 DIAGNOSIS — D649 Anemia, unspecified: Secondary | ICD-10-CM

## 2019-08-15 DIAGNOSIS — E559 Vitamin D deficiency, unspecified: Secondary | ICD-10-CM | POA: Diagnosis not present

## 2019-08-15 DIAGNOSIS — E782 Mixed hyperlipidemia: Secondary | ICD-10-CM | POA: Diagnosis not present

## 2019-08-15 DIAGNOSIS — R609 Edema, unspecified: Secondary | ICD-10-CM | POA: Diagnosis not present

## 2019-08-15 DIAGNOSIS — N1831 Chronic kidney disease, stage 3a: Secondary | ICD-10-CM

## 2019-08-15 DIAGNOSIS — R7309 Other abnormal glucose: Secondary | ICD-10-CM

## 2019-08-15 DIAGNOSIS — E611 Iron deficiency: Secondary | ICD-10-CM

## 2019-08-15 DIAGNOSIS — M25562 Pain in left knee: Secondary | ICD-10-CM | POA: Diagnosis not present

## 2019-08-15 DIAGNOSIS — M170 Bilateral primary osteoarthritis of knee: Secondary | ICD-10-CM

## 2019-08-15 LAB — CBC
HCT: 40 % (ref 36.0–46.0)
Hemoglobin: 13.5 g/dL (ref 12.0–15.0)
MCHC: 33.8 g/dL (ref 30.0–36.0)
MCV: 95 fl (ref 78.0–100.0)
Platelets: 292 10*3/uL (ref 150.0–400.0)
RBC: 4.21 Mil/uL (ref 3.87–5.11)
RDW: 13.4 % (ref 11.5–15.5)
WBC: 6.4 10*3/uL (ref 4.0–10.5)

## 2019-08-15 LAB — COMPREHENSIVE METABOLIC PANEL
ALT: 11 U/L (ref 0–35)
AST: 14 U/L (ref 0–37)
Albumin: 4 g/dL (ref 3.5–5.2)
Alkaline Phosphatase: 68 U/L (ref 39–117)
BUN: 17 mg/dL (ref 6–23)
CO2: 26 mEq/L (ref 19–32)
Calcium: 9 mg/dL (ref 8.4–10.5)
Chloride: 106 mEq/L (ref 96–112)
Creatinine, Ser: 0.94 mg/dL (ref 0.40–1.20)
GFR: 58.97 mL/min — ABNORMAL LOW (ref >60.00)
Glucose, Bld: 112 mg/dL — ABNORMAL HIGH (ref 70–99)
Potassium: 3.3 mEq/L — ABNORMAL LOW (ref 3.5–5.1)
Sodium: 140 mEq/L (ref 135–145)
Total Bilirubin: 0.4 mg/dL (ref 0.2–1.2)
Total Protein: 6.5 g/dL (ref 6.0–8.3)

## 2019-08-15 LAB — URINALYSIS, ROUTINE W REFLEX MICROSCOPIC
Bilirubin Urine: NEGATIVE
Hgb urine dipstick: NEGATIVE
Ketones, ur: NEGATIVE
Leukocytes,Ua: NEGATIVE
Nitrite: NEGATIVE
RBC / HPF: NONE SEEN (ref 0–?)
Specific Gravity, Urine: 1.02 (ref 1.000–1.030)
Total Protein, Urine: NEGATIVE
Urine Glucose: NEGATIVE
Urobilinogen, UA: 0.2 (ref 0.0–1.0)
pH: 5.5 (ref 5.0–8.0)

## 2019-08-15 LAB — HEMOGLOBIN A1C: Hgb A1c MFr Bld: 5.8 % (ref 4.6–6.5)

## 2019-08-15 LAB — TSH: TSH: 2.75 u[IU]/mL (ref 0.35–4.50)

## 2019-08-15 LAB — LDL CHOLESTEROL, DIRECT: Direct LDL: 133 mg/dL

## 2019-08-15 LAB — VITAMIN D 25 HYDROXY (VIT D DEFICIENCY, FRACTURES): VITD: 51.53 ng/mL (ref 30.00–100.00)

## 2019-08-15 LAB — VITAMIN B12: Vitamin B-12: >1526 pg/mL — ABNORMAL HIGH (ref 211–911)

## 2019-08-15 MED ORDER — METHYLPREDNISOLONE ACETATE 40 MG/ML IJ SUSP
80.0000 mg | INTRAMUSCULAR | Status: AC | PRN
  Administered 2019-08-15: 80 mg via INTRA_ARTICULAR

## 2019-08-15 MED ORDER — BUPIVACAINE HCL 0.5 % IJ SOLN
2.0000 mL | INTRAMUSCULAR | Status: AC | PRN
  Administered 2019-08-15: 2 mL via INTRA_ARTICULAR

## 2019-08-15 MED ORDER — LIDOCAINE HCL 1 % IJ SOLN
2.0000 mL | INTRAMUSCULAR | Status: AC | PRN
  Administered 2019-08-15: 2 mL

## 2019-08-15 NOTE — Progress Notes (Signed)
Office Visit Note   Patient: Megan Valentine           Date of Birth: 1950/06/15           MRN: [id number] Visit Date: 08/15/2019              Requested by: Libby Maw, MD [address] [place],  [place] [address] PCP: Libby Maw, MD   Assessment & Plan: Visit Diagnoses:  1. Chronic pain of left knee   2. Unilateral primary osteoarthritis, left knee   3. Primary osteoarthritis of both knees     Plan: Recent fall probably related to a vasovagal reaction.  Has exacerbated her prior symptoms of left knee osteoarthritis.  No acute changes by x-ray.  Long discussion regarding treatment options including knee replacement.  She had decided to proceed with that last year but since would like to wait.  I will inject her knee with cortisone today and pre-CERT Visco supplementation per her desire. This patient is diagnosed with osteoarthritis of the knee(s).    Radiographs show evidence of joint space narrowing, osteophytes, subchondral sclerosis and/or subchondral cysts.  This patient has knee pain which interferes with functional and activities of daily living.    This patient has experienced inadequate response, adverse effects and/or intolerance with conservative treatments such as acetaminophen, NSAIDS, topical creams, physical therapy or regular exercise, knee bracing and/or weight loss.   This patient has experienced inadequate response or has a contraindication to intra articular steroid injections for at least 3 months.   This patient is not scheduled to have a total knee replacement within 6 months of starting treatment with viscosupplementation.   Follow-Up Instructions: Return Pre-CERT Visco supplementation.   Orders:  Orders Placed This Encounter  Procedures   XR KNEE 3 VIEW LEFT   No orders of the defined types were placed in this encounter.     Procedures: Large Joint Inj: L knee on 08/15/2019 3:17 PM Indications: pain and diagnostic  evaluation Details: 25 G 1.5 in needle, anteromedial approach  Arthrogram: No  Medications: 2 mL lidocaine 1 %; 2 mL bupivacaine 0.5 %; 80 mg methylPREDNISolone acetate 40 MG/ML Procedure, treatment alternatives, risks and benefits explained, specific risks discussed. Consent was given by the patient. Patient was prepped and draped in the usual sterile fashion.       Clinical Data: No additional findings.   Subjective: Chief Complaint  Patient presents with   Left Knee - Pain  Ms. Sferrazza recently had a vasovagal reaction and fell forward landing on her face and her left knee.  She has been evaluated the emergency room with facial films and CT scan of her head without any obvious injury.  She is having a little more pain in her knee.  Prior films demonstrated end-stage osteoarthritis.  At one point last year she had decided to proceed with a knee replacement but since has wished to "wait".  Most of her pain is on the medial aspect of her knee.  No skin changes.  Still has occasional swelling.  Takes over-the-counter medicine.  No use of ambulatory aid  HPI  Review of Systems   Objective: Vital Signs: There were no vitals taken for this visit.  Physical Exam Constitutional:      Appearance: She is well-developed.  Eyes:     Pupils: Pupils are equal, round, and reactive to light.  Pulmonary:     Effort: Pulmonary effort is normal.  Skin:    General: Skin  is warm and dry.  Neurological:     Mental Status: She is alert and oriented to person, place, and time.  Psychiatric:        Behavior: Behavior normal.     Ortho Exam awake alert and oriented x3.  Comfortable sitting.  Left knee with increased varus.  Full extension flex about 100 degrees without instability.  Predominately medial joint pain.  Some patellar crepitation but no pain with compression.  No pain laterally.  No instability.  No distal edema.  No calf pain.  Straight leg raise negative.  Painless range of motion  of left hip  Specialty Comments:  No specialty comments available.  Imaging: XR KNEE 3 VIEW LEFT  Result Date: 08/15/2019 Films of the left knee were obtained in 3 projections standing.  No acute changes.  These were compared to films performed in 2019 and there is progression of the tricompartmental degenerative changes.  There is more flattening of the femoral surface in the medial compartment with subchondral sclerosis and peripheral osteophytes.  There is a little bit more varus compared to the films performed 2 years ago.  Films are consistent with end-stage osteoarthritis.  No ectopic calcification    PMFS History: Patient Active Problem List   Diagnosis Date Noted   B12 deficiency 08/15/2019   Elevated glucose 08/15/2019   Tick bite 05/17/2019   Anemia 02/15/2019   Iron deficiency 02/15/2019   Hospital discharge follow-up 01/04/2019   Acute cholecystitis 12/18/2018   Abdominal pain 12/14/2018   Sinus pressure 11/12/2018   History of 2019 novel coronavirus disease (COVID-19) 11/12/2018   Need for influenza vaccination 10/05/2018   Pleurisy 06/05/2018   Reactive airway disease 05/07/2018   Stage 3a chronic kidney disease 01/15/2018   Edema 12/15/2017   Insomnia 11/20/2017   Seborrheic dermatitis 11/16/2017   Seasonal allergic rhinitis due to pollen 11/16/2017   Rhus dermatitis 10/19/2017   Viral upper respiratory tract infection 10/19/2017   Menopausal and female climacteric states 10/19/2017   Vitamin D deficiency 10/19/2017   Need for vaccination for H flu type B 10/19/2017   Ceruminosis, right 06/12/2017   Acute sinusitis 07/02/2015   Constipation 03/26/2015   Degenerative arthritis of knee, bilateral 11/17/2014   Screening for osteoporosis 04/25/2014   Hypokalemia 09/15/2011   Hypothyroidism 08/21/2008   Mixed hyperlipidemia 08/21/2008   Essential hypertension 08/21/2008   Mitral valve disease 08/21/2008   Past Medical  History:  Diagnosis Date   Allergy    Arthritis    Cataract    Chronic bronchitis ([facility])    Complication of anesthesia    hard to wake up   Diverticulosis    Environmental allergies    GERD (gastroesophageal reflux disease)    Hiatal hernia    History of chicken pox    Hyperlipidemia    Hypothyroidism    IBS (irritable bowel syndrome)    Migraines    Mitral valve prolapse    Rectal prolapse     Family History  Problem Relation Age of Onset   Stomach cancer Maternal Grandmother    Hypertension Maternal Grandmother    Diabetes Maternal Grandmother    Stroke Maternal Grandmother    Cancer Maternal Grandmother        Stomach cancer   Colon cancer Paternal Grandfather    Heart attack Paternal Grandfather    Hyperlipidemia Mother    Diabetes Mother    Hypertension Mother    Alzheimer's disease Mother    Diabetes Father    Hyperlipidemia  Father    Heart disease Father    Heart attack Father    Stroke Paternal Grandmother     Past Surgical History:  Procedure Laterality Date   ABDOMINAL HYSTERECTOMY  1999   APPENDECTOMY  1962   BREAST CYST ASPIRATION Bilateral    CHOLECYSTECTOMY N/A 12/17/2018   Procedure: LAPAROSCOPIC CHOLECYSTECTOMY;  Surgeon: Clovis Riley, MD;  Location: [facility];  Service: General;  Laterality: N/A;   EYE SURGERY     JOINT REPLACEMENT     rt knee scope   REFRACTIVE SURGERY  2000   both eyes   TOTAL KNEE ARTHROPLASTY Right 03/24/2015   Procedure: TOTAL KNEE ARTHROPLASTY;  Surgeon: [name], MD;  Location: [facility];  Service: Orthopedics;  Laterality: Right;   TUBAL LIGATION  1977   Social History   Occupational History   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  Chart creation errors have been sought, but may not always  have been located. Such creation errors do not reflect on  the standard of medical care.

## 2019-08-15 NOTE — Progress Notes (Signed)
Established Patient Office Visit  Subjective:  Patient ID: Megan Valentine, female    DOB: Jun 04, 1950  Age: 69 y.o. MRN: [id number]  CC: No chief complaint on file.   HPI 83 [address] presents for her yearly follow-up for edema, anemia, vitamin D deficiency, elevated glucose without history of diabetes, CKD.  Status post negative work-up through neurology and cardiology for her syncopal spell.  Blood pressure has been on the low side with a Lasix therapy used for her chronic lower extremity edema.  She has been taking her potassium as directed daily.  She has developed a neuropathy as diagnosed per neurology.  Patient's B12 and TSH are well replaced with current therapy.  Past Medical History:  Diagnosis Date  . Allergy   . Arthritis   . Cataract   . Chronic bronchitis ([place])   . Complication of anesthesia    hard to wake up  . Diverticulosis   . Environmental allergies   . GERD (gastroesophageal reflux disease)   . Hiatal hernia   . History of chicken pox   . Hyperlipidemia   . Hypothyroidism   . IBS (irritable bowel syndrome)   . Migraines   . Mitral valve prolapse   . Rectal prolapse     Past Surgical History:  Procedure Laterality Date  . ABDOMINAL HYSTERECTOMY  1999  . APPENDECTOMY  1962  . BREAST CYST ASPIRATION Bilateral   . CHOLECYSTECTOMY N/A 12/17/2018   Procedure: LAPAROSCOPIC CHOLECYSTECTOMY;  Surgeon: Clovis Riley, MD;  Location: [facility];  Service: General;  Laterality: N/A;  . EYE SURGERY    . JOINT REPLACEMENT     rt knee scope  . REFRACTIVE SURGERY  2000   both eyes  . TOTAL KNEE ARTHROPLASTY Right 03/24/2015   Procedure: TOTAL KNEE ARTHROPLASTY;  Surgeon: [name], MD;  Location: [facility];  Service: Orthopedics;  Laterality: Right;  . TUBAL LIGATION  1977    Family History  Problem Relation Age of Onset  . Stomach cancer Maternal Grandmother   . Hypertension Maternal Grandmother   . Diabetes Maternal Grandmother   . Stroke Maternal  Grandmother   . Cancer Maternal Grandmother        Stomach cancer  . Colon cancer Paternal Grandfather   . Heart attack Paternal Grandfather   . Hyperlipidemia Mother   . Diabetes Mother   . Hypertension Mother   . Alzheimer's disease Mother   . Diabetes Father   . Hyperlipidemia Father   . Heart disease Father   . Heart attack Father   . Stroke Paternal Grandmother     Social History   Socioeconomic History  . Marital status: Widowed    Spouse name: Not on file  . Number of children: Not on file  . Years of education: 68  . Highest education level: Not on file  Occupational History  . 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 Smoking status: Former Smoker    Types: Cigarettes    Quit date: 03/23/1976    Years since quitting: 43.4  . Smokeless tobacco: Never Used  Vaping Use  . Vaping Use: Never used  Substance and Sexual Activity  . Alcohol use: No  . Drug use: No  . Sexual activity: Yes    Birth control/protection: Abstinence  Other Topics Concern  . Not on file  Social History Narrative   Caffeine Use-yes   Regular exercise-no  Right handed    Lives alone   Social Determinants of Health   Financial Resource Strain: Low Risk   . Difficulty of Paying Living Expenses: Not hard at all  Food Insecurity:   . Worried About Charity fundraiser in the Last Year:   . Arboriculturist in the Last Year:   Transportation Needs: No Transportation Needs  . Lack of Transportation (Medical): No  . Lack of Transportation (Non-Medical): No  Physical Activity:   . Days of Exercise per Week:   . Minutes of Exercise per Session:   Stress:   . Feeling of Stress :   Social Connections:   . Frequency of Communication with Friends and Family:   . Frequency of Social Gatherings with Friends and Family:   . Attends Religious Services:   . Active Member of Clubs or Organizations:   . Attends Archivist Meetings:     Marland Kitchen Marital Status:   Intimate Partner Violence:   . Fear of Current or Ex-Partner:   . Emotionally Abused:   Marland Kitchen Physically Abused:   . Sexually Abused:     Outpatient Medications Prior to Visit  Medication Sig Dispense Refill  . acetaminophen (TYLENOL) 500 MG tablet Take 1,000 mg by mouth every 6 (six) hours as needed for mild pain or headache.     . albuterol (VENTOLIN HFA) 108 (90 Base) MCG/ACT inhaler INHALE 2 PUFFS INTO THE LUNGS EVERY 6 HOURS AS NEEDED FOR WHEEZING OR SHORTNESS OF BREATH 8.5 g 2  . aspirin 81 MG chewable tablet Chew 81 mg daily by mouth.    . Biotin 1 MG CAPS Take 1 mg by mouth daily.    . Brimonidine Tartrate (LUMIFY) 0.025 % SOLN Apply 1 drop to eye 2 (two) times daily. Both eyes    . Cholecalciferol (VITAMIN D) 125 MCG (5000 UT) CAPS Take 5,000 Units by mouth daily.     . Cranberry-Vitamin C-Vitamin E (CRANBERRY PLUS VITAMIN C) 4200-20-3 MG-MG-UNIT CAPS Take 2 capsules by mouth daily.    Marland Kitchen estradiol (ESTRACE) 0.5 MG tablet TAKE 1 TABLET(0.5 MG) BY MOUTH DAILY 90 tablet 2  . ferrous sulfate 325 (65 FE) MG tablet Take 1 tablet (325 mg total) by mouth daily with breakfast. (Patient taking differently: Take 175 mg by mouth 3 (three) times a week. Mondays, Wednesdays, and Fridays) 30 tablet 3  . fluticasone (FLONASE) 50 MCG/ACT nasal spray Place 1 spray into both nostrils daily as needed for allergies or rhinitis.    . furosemide (LASIX) 20 MG tablet TAKE 1 TABLET(20 MG) BY MOUTH DAILY AS NEEDED 90 tablet 0  . hydroxypropyl methylcellulose / hypromellose (ISOPTO TEARS / GONIOVISC) 2.5 % ophthalmic solution Place 1 drop into both eyes 3 (three) times daily as needed for dry eyes.    Marland Kitchen levothyroxine (SYNTHROID) 100 MCG tablet TAKE 1 TABLET BY MOUTH DAILY 90 tablet 2  . pantoprazole (PROTONIX) 20 MG tablet TAKE 1 TABLET(20 MG) BY MOUTH DAILY 90 tablet 1  . potassium chloride SA (KLOR-CON) 20 MEQ tablet TAKE 1 TABLET BY MOUTH DAILY WITH LASIX 90 tablet 0  . Senna-Fennel  (NATURAL VEGETABLE LAXATIVE) TABS Take 8 tablets by mouth at bedtime.    . sodium chloride (OCEAN) 0.65 % SOLN nasal spray Place 1 spray into both nostrils as needed for congestion.    . vitamin B-12 (CYANOCOBALAMIN) 100 MCG tablet Take 2,500 mcg by mouth daily.     Marland Kitchen amoxicillin (AMOXIL) 500 MG tablet TAKE 4 TABLETS  BY MOUTH 1 HOUR PRIOR TO APPOINTMENT (Patient not taking: Reported on 08/15/2019)    . RESTASIS 0.05 % ophthalmic emulsion 1 drop 2 (two) times daily.     No facility-administered medications prior to visit.    Allergies  Allergen Reactions  . Tetracycline Hives and Itching  . Dilaudid [Hydromorphone Hcl] Nausea And Vomiting  . Other     Cigarette smoke and perfumes    ROS Review of Systems  Constitutional: Negative.   HENT: Negative.   Respiratory: Negative.   Cardiovascular: Negative.   Gastrointestinal: Negative.   Endocrine: Negative for polyphagia and polyuria.  Genitourinary: Negative.   Musculoskeletal: Negative for gait problem and joint swelling.  Allergic/Immunologic: Negative for immunocompromised state.  Neurological: Positive for light-headedness.  Hematological: Does not bruise/bleed easily.  Psychiatric/Behavioral: Negative.       Objective:    Physical Exam Vitals and nursing note reviewed.  Constitutional:      General: She is not in acute distress.    Appearance: Normal appearance. She is not ill-appearing, toxic-appearing or diaphoretic.  HENT:     Head: Normocephalic and atraumatic.     Right Ear: External ear normal. There is no impacted cerumen.     Left Ear: External ear normal. There is no impacted cerumen.     Mouth/Throat:     Mouth: Mucous membranes are moist.     Pharynx: Oropharynx is clear. No oropharyngeal exudate or posterior oropharyngeal erythema.  Eyes:     General: No scleral icterus.       Right eye: No discharge.        Left eye: No discharge.     Conjunctiva/sclera: Conjunctivae normal.     Pupils: Pupils are  equal, round, and reactive to light.  Pulmonary:     Effort: Pulmonary effort is normal.     Breath sounds: Normal breath sounds.  Abdominal:     General: Bowel sounds are normal.  Musculoskeletal:     Cervical back: No rigidity.     Right lower leg: No edema.     Left lower leg: No edema.  Lymphadenopathy:     Cervical: No cervical adenopathy.  Skin:    General: Skin is warm and dry.  Neurological:     Mental Status: She is alert and oriented to person, place, and time.  Psychiatric:        Mood and Affect: Mood normal.     BP (!) 100/60 (BP Location: Left Arm, Patient Position: Sitting, Cuff Size: Normal)   Pulse 70   Temp 97.9 F (36.6 C) (Oral)   Ht 5\' 4"  (1.626 m)   Wt 153 lb 2 oz (69.5 kg)   SpO2 98%   BMI 26.28 kg/m  Wt Readings from Last 3 Encounters:  08/15/19 153 lb 2 oz (69.5 kg)  07/11/19 154 lb 3.2 oz (69.9 kg)  06/28/19 150 lb (68 kg)     There are no preventive care reminders to display for this patient.  There are no preventive care reminders to display for this patient.  Lab Results  Component Value Date   TSH 2.97 09/04/2018   Lab Results  Component Value Date   WBC 6.0 02/15/2019   HGB 13.5 02/15/2019   HCT 40.8 02/15/2019   MCV 95.1 02/15/2019   PLT 337.0 02/15/2019   Lab Results  Component Value Date   NA 144 02/15/2019   K 4.0 02/15/2019   CO2 27 02/15/2019   GLUCOSE 118 (H) 02/15/2019   BUN 20  02/15/2019   CREATININE 1.00 02/15/2019   BILITOT 1.0 12/17/2018   ALKPHOS 47 12/17/2018   AST 13 (L) 12/17/2018   ALT 13 12/17/2018   PROT 4.9 (L) 12/17/2018   ALBUMIN 2.6 (L) 12/17/2018   CALCIUM 9.4 02/15/2019   ANIONGAP 5 12/17/2018   GFR 54.99 (L) 02/15/2019   Lab Results  Component Value Date   CHOL 213 (H) 10/19/2017   Lab Results  Component Value Date   HDL 75.50 10/19/2017   Lab Results  Component Value Date   LDLCALC 117 (H) 10/19/2017   Lab Results  Component Value Date   TRIG 98.0 10/19/2017   Lab Results    Component Value Date   CHOLHDL 3 10/19/2017   No results found for: HGBA1C    Assessment & Plan:   Problem List Items Addressed This Visit      Endocrine   Hypothyroidism - Primary   Relevant Orders   TSH     Genitourinary   Stage 3a chronic kidney disease   Relevant Orders   Comprehensive metabolic panel     Other   Mixed hyperlipidemia   Relevant Orders   LDL cholesterol, direct   Vitamin D deficiency   Relevant Orders   VITAMIN D 25 Hydroxy (Vit-D Deficiency, Fractures)   Edema   Anemia   Relevant Orders   CBC   Urinalysis, Routine w reflex microscopic   Iron deficiency   Relevant Orders   Iron, TIBC and Ferritin Panel   B12 deficiency   Relevant Orders   Vitamin B12   Elevated glucose   Relevant Orders   Hemoglobin A1c    Here comes her  No orders of the defined types were placed in this encounter.   Follow-up: Return in about 6 months (around 02/15/2020).   Continue all medicines as above.  Reminded her that Lasix therapy for her chronic anemia may be leading to some of her syncopal episodes.  We may need to consider using support stockings for her edema.  She will continue all medicines including her potassium especially when she is taking the Lasix. Libby Maw, MD

## 2019-08-15 NOTE — Telephone Encounter (Signed)
Please get authorization for Gel injection-Left knee- Dr. Durward Fortes pt.

## 2019-08-15 NOTE — Telephone Encounter (Signed)
Noted  

## 2019-08-21 LAB — TRANSFERRIN: Transferrin: 228 mg/dL (ref 188–341)

## 2019-08-21 LAB — IRON,TIBC AND FERRITIN PANEL
Ferritin: 63 ng/mL (ref 16–288)
Iron: 109 ug/dL (ref 45–160)

## 2019-08-23 ENCOUNTER — Telehealth: Payer: Self-pay

## 2019-08-23 ENCOUNTER — Telehealth: Payer: Self-pay | Admitting: Orthopaedic Surgery

## 2019-08-23 NOTE — Telephone Encounter (Signed)
Submitted VOB, Orthovisc, left knee. 

## 2019-08-23 NOTE — Telephone Encounter (Signed)
Noted! Thank you

## 2019-08-23 NOTE — Telephone Encounter (Signed)
FYI

## 2019-08-23 NOTE — Telephone Encounter (Signed)
Daleen Snook from [facility] returned a call. Daleen Snook stated she called to verify member is active with coverage under group # [id number]. Effective date 02/25/15. Patient reference number is [id number]. Daleen Snook phone number is [phone].

## 2019-08-26 ENCOUNTER — Telehealth: Payer: Self-pay

## 2019-08-26 NOTE — Telephone Encounter (Signed)
Approved for Orthovisc-Left knee Buy and Bill Dr. Durward Fortes $30 copay 20% OOP No prior auth required

## 2019-08-26 NOTE — Telephone Encounter (Signed)
Called pt and scheduled appointments Pt is aware of copay

## 2019-08-27 ENCOUNTER — Other Ambulatory Visit: Payer: Self-pay | Admitting: Family Medicine

## 2019-08-27 DIAGNOSIS — Z1231 Encounter for screening mammogram for malignant neoplasm of breast: Secondary | ICD-10-CM

## 2019-09-09 ENCOUNTER — Other Ambulatory Visit: Payer: Self-pay

## 2019-09-09 ENCOUNTER — Ambulatory Visit
Admission: RE | Admit: 2019-09-09 | Discharge: 2019-09-09 | Disposition: A | Discharge status: Home / Self Care | Payer: PPO | Source: Ambulatory Visit | Attending: Family Medicine | Admitting: Family Medicine

## 2019-09-09 DIAGNOSIS — Z1231 Encounter for screening mammogram for malignant neoplasm of breast: Secondary | ICD-10-CM | POA: Diagnosis not present

## 2019-09-17 ENCOUNTER — Other Ambulatory Visit: Payer: Self-pay

## 2019-09-17 ENCOUNTER — Encounter: Payer: Self-pay | Admitting: Orthopaedic Surgery

## 2019-09-17 ENCOUNTER — Ambulatory Visit (INDEPENDENT_AMBULATORY_CARE_PROVIDER_SITE_OTHER): Payer: PPO | Admitting: Orthopaedic Surgery

## 2019-09-17 VITALS — Ht 64.0 in | Wt 153.0 lb

## 2019-09-17 DIAGNOSIS — M1712 Unilateral primary osteoarthritis, left knee: Secondary | ICD-10-CM | POA: Diagnosis not present

## 2019-09-17 MED ORDER — HYALURONAN 30 MG/2ML IX SOSY
30.0000 mg | PREFILLED_SYRINGE | INTRA_ARTICULAR | Status: AC | PRN
  Administered 2019-09-17: 30 mg via INTRA_ARTICULAR

## 2019-09-17 MED ORDER — LIDOCAINE HCL 1 % IJ SOLN
2.0000 mL | INTRAMUSCULAR | Status: AC | PRN
  Administered 2019-09-17: 2 mL

## 2019-09-17 NOTE — Progress Notes (Signed)
Office Visit Note   Patient: Megan Valentine           Date of Birth: 08-04-1950           MRN: [id number] Visit Date: 09/17/2019              Requested by: Libby Maw, MD [address] [place],  [place] [address] PCP: Libby Maw, MD   Assessment & Plan: Visit Diagnoses:  1. Unilateral primary osteoarthritis, left knee     Plan:  #1: Orthovisc injection was given to the left knee without difficulty. #2: Follow back up in 1 week for her second Orthovisc injection  Follow-Up Instructions: Return in about 1 week (around 09/24/2019).   Orders:  No orders of the defined types were placed in this encounter.  No orders of the defined types were placed in this encounter.     Procedures: Large Joint Inj: L knee on 09/17/2019 5:07 PM Indications: pain and joint swelling Details: 25 G 1.5 in needle, anteromedial approach  Arthrogram: No  Medications: 2 mL lidocaine 1 %; 30 mg Hyaluronan 30 MG/2ML Outcome: tolerated well, no immediate complications Procedure, treatment alternatives, risks and benefits explained, specific risks discussed. Consent was given by the patient. Immediately prior to procedure a time out was called to verify the correct patient, procedure, equipment, support staff and site/side marked as required. Patient was prepped and draped in the usual sterile fashion.       Clinical Data: No additional findings.   Subjective: Chief Complaint  Patient presents with  . Left Knee - Follow-up    Orthovisc started 09/17/2019  Patient presents today for her first Orthovisc injection in her left knee.   HPI  Megan Valentine is a very pleasant 69 year old white female who presents today for viscosupplementation.  She is a candidate for total knee replacement radiographically with end-stage osteoarthritis however she is not interested at this time and surgical intervention.  She would like to stay conservative.  Discussion has ensued.  She would  like to consider that of viscosupplementation.  She is here for her first injection.   Review of Systems  Constitutional: Negative for fatigue.  HENT: Negative for ear pain.   Eyes: Negative for pain.  Respiratory: Negative for shortness of breath.   Cardiovascular: Negative for leg swelling.  Gastrointestinal: Negative for constipation and diarrhea.  Endocrine: Negative for cold intolerance and heat intolerance.  Genitourinary: Negative for difficulty urinating.  Musculoskeletal: Positive for joint swelling.  Skin: Negative for rash.  Allergic/Immunologic: Negative for food allergies.  Neurological: Negative for weakness.  Hematological: Does not bruise/bleed easily.  Psychiatric/Behavioral: Positive for sleep disturbance.     Objective: Vital Signs: Ht 5\' 4"  (1.626 m)   Wt 153 lb (69.4 kg)   BMI 26.26 kg/m   Physical Exam Constitutional:      Appearance: Normal appearance. She is well-developed and normal weight.  HENT:     Head: Normocephalic.  Eyes:     Pupils: Pupils are equal, round, and reactive to light.  Pulmonary:     Effort: Pulmonary effort is normal.  Skin:    General: Skin is warm and dry.  Neurological:     Mental Status: She is alert and oriented to person, place, and time.  Psychiatric:        Behavior: Behavior normal.     Ortho Exam  Exam today reveals range of motion full extension to 100 degrees of flexion.  No instability is noted.  She  does have some patellofemoral crepitance with range of motion.  Tender medial joint line.  Negative straight leg raising.   Specialty Comments:  No specialty comments available.  Imaging: No results found.   PMFS History: Current Outpatient Medications  Medication Sig Dispense Refill  . acetaminophen (TYLENOL) 500 MG tablet Take 1,000 mg by mouth every 6 (six) hours as needed for mild pain or headache.     . albuterol (VENTOLIN HFA) 108 (90 Base) MCG/ACT inhaler INHALE 2 PUFFS INTO THE LUNGS EVERY 6  HOURS AS NEEDED FOR WHEEZING OR SHORTNESS OF BREATH 8.5 g 2  . amoxicillin (AMOXIL) 500 MG tablet TAKE 4 TABLETS BY MOUTH 1 HOUR PRIOR TO APPOINTMENT    . aspirin 81 MG chewable tablet Chew 81 mg daily by mouth.    . Biotin 1 MG CAPS Take 1 mg by mouth daily.    . Brimonidine Tartrate (LUMIFY) 0.025 % SOLN Apply 1 drop to eye 2 (two) times daily. Both eyes    . Cholecalciferol (VITAMIN D) 125 MCG (5000 UT) CAPS Take 5,000 Units by mouth daily.     . Cranberry-Vitamin C-Vitamin E (CRANBERRY PLUS VITAMIN C) 4200-20-3 MG-MG-UNIT CAPS Take 2 capsules by mouth daily.    Marland Kitchen estradiol (ESTRACE) 0.5 MG tablet TAKE 1 TABLET(0.5 MG) BY MOUTH DAILY 90 tablet 2  . ferrous sulfate 325 (65 FE) MG tablet Take 1 tablet (325 mg total) by mouth daily with breakfast. (Patient taking differently: Take 175 mg by mouth 3 (three) times a week. Mondays, Wednesdays, and Fridays) 30 tablet 3  . fluticasone (FLONASE) 50 MCG/ACT nasal spray Place 1 spray into both nostrils daily as needed for allergies or rhinitis.    . furosemide (LASIX) 20 MG tablet TAKE 1 TABLET(20 MG) BY MOUTH DAILY AS NEEDED 90 tablet 0  . hydroxypropyl methylcellulose / hypromellose (ISOPTO TEARS / GONIOVISC) 2.5 % ophthalmic solution Place 1 drop into both eyes 3 (three) times daily as needed for dry eyes.    Marland Kitchen levothyroxine (SYNTHROID) 100 MCG tablet TAKE 1 TABLET BY MOUTH DAILY 90 tablet 2  . pantoprazole (PROTONIX) 20 MG tablet TAKE 1 TABLET(20 MG) BY MOUTH DAILY 90 tablet 1  . potassium chloride SA (KLOR-CON) 20 MEQ tablet TAKE 1 TABLET BY MOUTH DAILY WITH LASIX 90 tablet 0  . RESTASIS 0.05 % ophthalmic emulsion 1 drop 2 (two) times daily.    . Senna-Fennel (NATURAL VEGETABLE LAXATIVE) TABS Take 8 tablets by mouth at bedtime.    . sodium chloride (OCEAN) 0.65 % SOLN nasal spray Place 1 spray into both nostrils as needed for congestion.    . vitamin B-12 (CYANOCOBALAMIN) 100 MCG tablet Take 2,500 mcg by mouth daily.      No current  facility-administered medications for this visit.    Patient Active Problem List   Diagnosis Date Noted  . B12 deficiency 08/15/2019  . Elevated glucose 08/15/2019  . Tick bite 05/17/2019  . Anemia 02/15/2019  . Iron deficiency 02/15/2019  . Hospital discharge follow-up 01/04/2019  . Acute cholecystitis 12/18/2018  . Abdominal pain 12/14/2018  . Sinus pressure 11/12/2018  . History of 2019 novel coronavirus disease (COVID-19) 11/12/2018  . Need for influenza vaccination 10/05/2018  . Pleurisy 06/05/2018  . Reactive airway disease 05/07/2018  . Stage 3a chronic kidney disease 01/15/2018  . Edema 12/15/2017  . Insomnia 11/20/2017  . Seborrheic dermatitis 11/16/2017  . Seasonal allergic rhinitis due to pollen 11/16/2017  . Rhus dermatitis 10/19/2017  . Viral upper respiratory tract infection 10/19/2017  .  Menopausal and female climacteric states 10/19/2017  . Vitamin D deficiency 10/19/2017  . Need for vaccination for H flu type B 10/19/2017  . Ceruminosis, right 06/12/2017  . Acute sinusitis 07/02/2015  . Constipation 03/26/2015  . Unilateral primary osteoarthritis, left knee 11/17/2014  . Screening for osteoporosis 04/25/2014  . Hypokalemia 09/15/2011  . Hypothyroidism 08/21/2008  . Mixed hyperlipidemia 08/21/2008  . Essential hypertension 08/21/2008  . Mitral valve disease 08/21/2008   Past Medical History:  Diagnosis Date  . Allergy   . Arthritis   . Cataract   . Chronic bronchitis ([place])   . Complication of anesthesia    hard to wake up  . Diverticulosis   . Environmental allergies   . GERD (gastroesophageal reflux disease)   . Hiatal hernia   . History of chicken pox   . Hyperlipidemia   . Hypothyroidism   . IBS (irritable bowel syndrome)   . Migraines   . Mitral valve prolapse   . Rectal prolapse     Family History  Problem Relation Age of Onset  . Stomach cancer Maternal Grandmother   . Hypertension Maternal Grandmother   . Diabetes Maternal  Grandmother   . Stroke Maternal Grandmother   . Cancer Maternal Grandmother        Stomach cancer  . Colon cancer Paternal Grandfather   . Heart attack Paternal Grandfather   . Hyperlipidemia Mother   . Diabetes Mother   . Hypertension Mother   . Alzheimer's disease Mother   . Diabetes Father   . Hyperlipidemia Father   . Heart disease Father   . Heart attack Father   . Stroke Paternal Grandmother     Past Surgical History:  Procedure Laterality Date  . ABDOMINAL HYSTERECTOMY  1999  . APPENDECTOMY  1962  . BREAST CYST ASPIRATION Bilateral   . CHOLECYSTECTOMY N/A 12/17/2018   Procedure: LAPAROSCOPIC CHOLECYSTECTOMY;  Surgeon: Clovis Riley, MD;  Location: [facility];  Service: General;  Laterality: N/A;  . EYE SURGERY    . JOINT REPLACEMENT     rt knee scope  . REFRACTIVE SURGERY  2000   both eyes  . TOTAL KNEE ARTHROPLASTY Right 03/24/2015   Procedure: TOTAL KNEE ARTHROPLASTY;  Surgeon: [name], MD;  Location: [facility];  Service: Orthopedics;  Laterality: Right;  . TUBAL LIGATION  1977   Social History   Occupational History  . 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 Smoking status: Former Smoker    Types: Cigarettes    Quit date: 03/23/1976    Years since quitting: 43.5  . Smokeless tobacco: Never Used  Vaping Use  . Vaping Use: Never used  Substance and Sexual Activity  . Alcohol use: No  . Drug use: No  . Sexual activity: Yes    Birth control/protection: Abstinence

## 2019-09-21 ENCOUNTER — Other Ambulatory Visit: Payer: Self-pay | Admitting: Family Medicine

## 2019-09-22 ENCOUNTER — Encounter: Payer: Self-pay | Admitting: Family Medicine

## 2019-09-23 ENCOUNTER — Telehealth: Payer: Self-pay

## 2019-09-23 NOTE — Telephone Encounter (Signed)
Per patient MyChart msg:  "Good morning Megan Valentine, [facility], [address] said they sent a prescription refill request for my furosmide, but it was denied & told me to contact Dr Ethelene Hal. Help please. Thanks, Megan Valentine [birth year]. Have a great day."  Discussed patient's concern with PCP. Per Dr. Ethelene Hal, patient needs a medication management appointment due to hypokalemia and low BP.   Contacted patient to schedule appointment. Patient reported she was taking a B12 supplement and feels it attributed to low K+ level. Patient states she stopped taking B12 supplement one week ago and "wants to give it a few weeks" before seeing Dr. Ethelene Hal.   Appointment scheduled 10/07/19 @ 1330 based on patient's request.

## 2019-09-24 ENCOUNTER — Other Ambulatory Visit: Payer: Self-pay

## 2019-09-24 ENCOUNTER — Ambulatory Visit: Payer: PPO | Admitting: Orthopaedic Surgery

## 2019-09-24 ENCOUNTER — Encounter: Payer: Self-pay | Admitting: Orthopaedic Surgery

## 2019-09-24 VITALS — Ht 64.0 in | Wt 153.0 lb

## 2019-09-24 DIAGNOSIS — M1712 Unilateral primary osteoarthritis, left knee: Secondary | ICD-10-CM | POA: Diagnosis not present

## 2019-09-24 MED ORDER — HYALURONAN 30 MG/2ML IX SOSY
30.0000 mg | PREFILLED_SYRINGE | INTRA_ARTICULAR | Status: AC | PRN
  Administered 2019-09-24: 30 mg via INTRA_ARTICULAR

## 2019-09-24 MED ORDER — LIDOCAINE HCL 1 % IJ SOLN
2.0000 mL | INTRAMUSCULAR | Status: AC | PRN
  Administered 2019-09-24: 2 mL

## 2019-09-24 NOTE — Progress Notes (Signed)
Office Visit Note   Patient: Megan Valentine           Date of Birth: Mar 03, 1950           MRN: [id number] Visit Date: 09/24/2019              Requested by: Libby Maw, MD [address] [place],  [place] [id number] PCP: Libby Maw, MD   Assessment & Plan: Visit Diagnoses:  1. Unilateral primary osteoarthritis, left knee     Plan:  #1: The second Orthovisc injection was given to the left knee without difficulty.  Tolerated procedure well. #2: Follow back up 1 week for her third injection.   Follow-Up Instructions: No follow-ups on file.   Orders:  No orders of the defined types were placed in this encounter.  No orders of the defined types were placed in this encounter.     Procedures: Large Joint Inj: L knee on 09/24/2019 2:45 PM Indications: pain and joint swelling Details: 25 G 1.5 in needle, anteromedial approach  Arthrogram: No  Medications: 2 mL lidocaine 1 %; 30 mg Hyaluronan 30 MG/2ML Outcome: tolerated well, no immediate complications Procedure, treatment alternatives, risks and benefits explained, specific risks discussed. Consent was given by the patient. Immediately prior to procedure a time out was called to verify the correct patient, procedure, equipment, support staff and site/side marked as required. Patient was prepped and draped in the usual sterile fashion.       Clinical Data: No additional findings.   Subjective: Chief Complaint  Patient presents with  . Left Knee - Follow-up    Orthovisc started 09/17/2019  Patient presents today for the second orthovisc injection in her left knee. She started the series on 09/17/2019. No improvement or changes thus far in her knee. She wishes to continue today.  HPI  Review of Systems  Constitutional: Negative for fatigue.  HENT: Negative for ear pain.   Eyes: Negative for pain.  Respiratory: Negative for shortness of breath.   Cardiovascular: Negative for leg swelling.    Gastrointestinal: Negative for constipation and diarrhea.  Endocrine: Negative for cold intolerance and heat intolerance.  Genitourinary: Negative for difficulty urinating.  Musculoskeletal: Positive for joint swelling.  Skin: Negative for rash.  Allergic/Immunologic: Negative for food allergies.  Neurological: Negative for weakness.  Hematological: Does not bruise/bleed easily.  Psychiatric/Behavioral: Positive for sleep disturbance.     Objective: Vital Signs: Ht 5\' 4"  (1.626 m)   Wt 153 lb (69.4 kg)   BMI 26.26 kg/m   Physical Exam  Ortho Exam  Exam today reveals the knees to be benign without any signs of inflammation on the left.  Specialty Comments:  No specialty comments available.  Imaging: No results found.   PMFS History: Patient Active Problem List   Diagnosis Date Noted  . B12 deficiency 08/15/2019  . Elevated glucose 08/15/2019  . Tick bite 05/17/2019  . Anemia 02/15/2019  . Iron deficiency 02/15/2019  . Hospital discharge follow-up 01/04/2019  . Acute cholecystitis 12/18/2018  . Abdominal pain 12/14/2018  . Sinus pressure 11/12/2018  . History of 2019 novel coronavirus disease (COVID-19) 11/12/2018  . Need for influenza vaccination 10/05/2018  . Pleurisy 06/05/2018  . Reactive airway disease 05/07/2018  . Stage 3a chronic kidney disease 01/15/2018  . Edema 12/15/2017  . Insomnia 11/20/2017  . Seborrheic dermatitis 11/16/2017  . Seasonal allergic rhinitis due to pollen 11/16/2017  . Rhus dermatitis 10/19/2017  . Viral upper respiratory tract infection 10/19/2017  .  Menopausal and female climacteric states 10/19/2017  . Vitamin D deficiency 10/19/2017  . Need for vaccination for H flu type B 10/19/2017  . Ceruminosis, right 06/12/2017  . Acute sinusitis 07/02/2015  . Constipation 03/26/2015  . Unilateral primary osteoarthritis, left knee 11/17/2014  . Screening for osteoporosis 04/25/2014  . Hypokalemia 09/15/2011  . Hypothyroidism  08/21/2008  . Mixed hyperlipidemia 08/21/2008  . Essential hypertension 08/21/2008  . Mitral valve disease 08/21/2008   Past Medical History:  Diagnosis Date  . Allergy   . Arthritis   . Cataract   . Chronic bronchitis ([place])   . Complication of anesthesia    hard to wake up  . Diverticulosis   . Environmental allergies   . GERD (gastroesophageal reflux disease)   . Hiatal hernia   . History of chicken pox   . Hyperlipidemia   . Hypothyroidism   . IBS (irritable bowel syndrome)   . Migraines   . Mitral valve prolapse   . Rectal prolapse     Family History  Problem Relation Age of Onset  . Stomach cancer Maternal Grandmother   . Hypertension Maternal Grandmother   . Diabetes Maternal Grandmother   . Stroke Maternal Grandmother   . Cancer Maternal Grandmother        Stomach cancer  . Colon cancer Paternal Grandfather   . Heart attack Paternal Grandfather   . Hyperlipidemia Mother   . Diabetes Mother   . Hypertension Mother   . Alzheimer's disease Mother   . Diabetes Father   . Hyperlipidemia Father   . Heart disease Father   . Heart attack Father   . Stroke Paternal Grandmother     Past Surgical History:  Procedure Laterality Date  . ABDOMINAL HYSTERECTOMY  1999  . APPENDECTOMY  1962  . BREAST CYST ASPIRATION Bilateral   . CHOLECYSTECTOMY N/A 12/17/2018   Procedure: LAPAROSCOPIC CHOLECYSTECTOMY;  Surgeon: Clovis Riley, MD;  Location: [facility];  Service: General;  Laterality: N/A;  . EYE SURGERY    . JOINT REPLACEMENT     rt knee scope  . REFRACTIVE SURGERY  2000   both eyes  . TOTAL KNEE ARTHROPLASTY Right 03/24/2015   Procedure: TOTAL KNEE ARTHROPLASTY;  Surgeon: [name], MD;  Location: [facility];  Service: Orthopedics;  Laterality: Right;  . TUBAL LIGATION  1977   Social History   Occupational History  . 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 Smoking status: Former Smoker    Types:  Cigarettes    Quit date: 03/23/1976    Years since quitting: 43.5  . Smokeless tobacco: Never Used  Vaping Use  . Vaping Use: Never used  Substance and Sexual Activity  . Alcohol use: No  . Drug use: No  . Sexual activity: Yes    Birth control/protection: Abstinence

## 2019-10-01 ENCOUNTER — Other Ambulatory Visit: Payer: Self-pay

## 2019-10-01 ENCOUNTER — Encounter: Payer: Self-pay | Admitting: Orthopaedic Surgery

## 2019-10-01 ENCOUNTER — Ambulatory Visit: Payer: PPO | Admitting: Orthopaedic Surgery

## 2019-10-01 VITALS — Ht 64.0 in | Wt 153.0 lb

## 2019-10-01 DIAGNOSIS — M1712 Unilateral primary osteoarthritis, left knee: Secondary | ICD-10-CM | POA: Diagnosis not present

## 2019-10-01 MED ORDER — LIDOCAINE HCL 1 % IJ SOLN
2.0000 mL | INTRAMUSCULAR | Status: AC | PRN
  Administered 2019-10-01: 2 mL

## 2019-10-01 MED ORDER — HYALURONAN 30 MG/2ML IX SOSY
30.0000 mg | PREFILLED_SYRINGE | INTRA_ARTICULAR | Status: AC | PRN
  Administered 2019-10-01: 30 mg via INTRA_ARTICULAR

## 2019-10-01 NOTE — Progress Notes (Signed)
Office Visit Note   Patient: Megan Valentine           Date of Birth: [birth year]           MRN: [id number] Visit Date: 10/01/2019              Requested by: Libby Maw, MD [address] [place],  [place] [address] PCP: Libby Maw, MD   Assessment & Plan: Visit Diagnoses:  1. Unilateral primary osteoarthritis, left knee     Plan:   #1: The third Orthovisc injection was given without difficulty.  Tolerated procedure well. #2: Follow back up as needed  Follow-Up Instructions: No follow-ups on file.   Orders:  No orders of the defined types were placed in this encounter.  No orders of the defined types were placed in this encounter.     Procedures: Large Joint Inj: L knee on 10/01/2019 1:20 PM Indications: pain and joint swelling Details: 25 G 1.5 in needle, anteromedial approach  Arthrogram: No  Medications: 2 mL lidocaine 1 %; 30 mg Hyaluronan 30 MG/2ML Outcome: tolerated well, no immediate complications Procedure, treatment alternatives, risks and benefits explained, specific risks discussed. Consent was given by the patient. Immediately prior to procedure a time out was called to verify the correct patient, procedure, equipment, support staff and site/side marked as required. Patient was prepped and draped in the usual sterile fashion.       Clinical Data: No additional findings.   Subjective: Chief Complaint  Patient presents with  . Left Knee - Follow-up    Orthovisc left knee started 09/17/2019  Patient presents today for the third orthovisc injection in her left knee. She started the series on 09/17/2019. Patient states that she has noticed mild improvement thus far.  HPI  Megan Valentine returns today for her third Orthovisc injection.  She states that she feels there is some improvement now after the second 1.  Denies any reactivity.  Review of Systems   Objective: Vital Signs: Ht 5\' 4"  (1.626 m)   Wt 153 lb (69.4 kg)   BMI 26.26  kg/m   Physical Exam  Ortho Exam  Exam today reveals may be quite benign.  No erythema or warmth.  Little bit of crepitance with range of motion.  Specialty Comments:  No specialty comments available.  Imaging: No results found.   PMFS History: Current Outpatient Medications  Medication Sig Dispense Refill  . acetaminophen (TYLENOL) 500 MG tablet Take 1,000 mg by mouth every 6 (six) hours as needed for mild pain or headache.     . albuterol (VENTOLIN HFA) 108 (90 Base) MCG/ACT inhaler INHALE 2 PUFFS INTO THE LUNGS EVERY 6 HOURS AS NEEDED FOR WHEEZING OR SHORTNESS OF BREATH 8.5 g 2  . amoxicillin (AMOXIL) 500 MG tablet TAKE 4 TABLETS BY MOUTH 1 HOUR PRIOR TO APPOINTMENT    . aspirin 81 MG chewable tablet Chew 81 mg daily by mouth.    . Biotin 1 MG CAPS Take 1 mg by mouth daily.    . Brimonidine Tartrate (LUMIFY) 0.025 % SOLN Apply 1 drop to eye 2 (two) times daily. Both eyes    . Cholecalciferol (VITAMIN D) 125 MCG (5000 UT) CAPS Take 5,000 Units by mouth daily.     . Cranberry-Vitamin C-Vitamin E (CRANBERRY PLUS VITAMIN C) 4200-20-3 MG-MG-UNIT CAPS Take 2 capsules by mouth daily.    Marland Kitchen estradiol (ESTRACE) 0.5 MG tablet TAKE 1 TABLET(0.5 MG) BY MOUTH DAILY 90 tablet 2  . ferrous  sulfate 325 (65 FE) MG tablet Take 1 tablet (325 mg total) by mouth daily with breakfast. (Patient taking differently: Take 175 mg by mouth 3 (three) times a week. Mondays, Wednesdays, and Fridays) 30 tablet 3  . fluticasone (FLONASE) 50 MCG/ACT nasal spray Place 1 spray into both nostrils daily as needed for allergies or rhinitis.    . furosemide (LASIX) 20 MG tablet TAKE 1 TABLET(20 MG) BY MOUTH DAILY AS NEEDED 90 tablet 0  . hydroxypropyl methylcellulose / hypromellose (ISOPTO TEARS / GONIOVISC) 2.5 % ophthalmic solution Place 1 drop into both eyes 3 (three) times daily as needed for dry eyes.    Marland Kitchen levothyroxine (SYNTHROID) 100 MCG tablet TAKE 1 TABLET BY MOUTH DAILY 90 tablet 2  . pantoprazole (PROTONIX) 20  MG tablet TAKE 1 TABLET(20 MG) BY MOUTH DAILY 90 tablet 1  . potassium chloride SA (KLOR-CON) 20 MEQ tablet TAKE 1 TABLET BY MOUTH DAILY WITH LASIX 90 tablet 0  . RESTASIS 0.05 % ophthalmic emulsion 1 drop 2 (two) times daily.    . Senna-Fennel (NATURAL VEGETABLE LAXATIVE) TABS Take 8 tablets by mouth at bedtime.    . sodium chloride (OCEAN) 0.65 % SOLN nasal spray Place 1 spray into both nostrils as needed for congestion.     No current facility-administered medications for this visit.    Patient Active Problem List   Diagnosis Date Noted  . B12 deficiency 08/15/2019  . Elevated glucose 08/15/2019  . Tick bite 05/17/2019  . Anemia 02/15/2019  . Iron deficiency 02/15/2019  . Hospital discharge follow-up 01/04/2019  . Acute cholecystitis 12/18/2018  . Abdominal pain 12/14/2018  . Sinus pressure 11/12/2018  . History of 2019 novel coronavirus disease (COVID-19) 11/12/2018  . Need for influenza vaccination 10/05/2018  . Pleurisy 06/05/2018  . Reactive airway disease 05/07/2018  . Stage 3a chronic kidney disease 01/15/2018  . Edema 12/15/2017  . Insomnia 11/20/2017  . Seborrheic dermatitis 11/16/2017  . Seasonal allergic rhinitis due to pollen 11/16/2017  . Rhus dermatitis 10/19/2017  . Viral upper respiratory tract infection 10/19/2017  . Menopausal and female climacteric states 10/19/2017  . Vitamin D deficiency 10/19/2017  . Need for vaccination for H flu type B 10/19/2017  . Ceruminosis, right 06/12/2017  . Acute sinusitis 07/02/2015  . Constipation 03/26/2015  . Unilateral primary osteoarthritis, left knee 11/17/2014  . Screening for osteoporosis 04/25/2014  . Hypokalemia 09/15/2011  . Hypothyroidism 08/21/2008  . Mixed hyperlipidemia 08/21/2008  . Essential hypertension 08/21/2008  . Mitral valve disease 08/21/2008   Past Medical History:  Diagnosis Date  . Allergy   . Arthritis   . Cataract   . Chronic bronchitis ([facility])   . Complication of anesthesia    hard to  wake up  . Diverticulosis   . Environmental allergies   . GERD (gastroesophageal reflux disease)   . Hiatal hernia   . History of chicken pox   . Hyperlipidemia   . Hypothyroidism   . IBS (irritable bowel syndrome)   . Migraines   . Mitral valve prolapse   . Rectal prolapse     Family History  Problem Relation Age of Onset  . Stomach cancer Maternal Grandmother   . Hypertension Maternal Grandmother   . Diabetes Maternal Grandmother   . Stroke Maternal Grandmother   . Cancer Maternal Grandmother        Stomach cancer  . Colon cancer Paternal Grandfather   . Heart attack Paternal Grandfather   . Hyperlipidemia Mother   . Diabetes Mother   .  Hypertension Mother   . Alzheimer's disease Mother   . Diabetes Father   . Hyperlipidemia Father   . Heart disease Father   . Heart attack Father   . Stroke Paternal Grandmother     Past Surgical History:  Procedure Laterality Date  . ABDOMINAL HYSTERECTOMY  1999  . APPENDECTOMY  1962  . BREAST CYST ASPIRATION Bilateral   . CHOLECYSTECTOMY N/A 12/17/2018   Procedure: LAPAROSCOPIC CHOLECYSTECTOMY;  Surgeon: Clovis Riley, MD;  Location: [facility];  Service: General;  Laterality: N/A;  . EYE SURGERY    . JOINT REPLACEMENT     rt knee scope  . REFRACTIVE SURGERY  2000   both eyes  . TOTAL KNEE ARTHROPLASTY Right 03/24/2015   Procedure: TOTAL KNEE ARTHROPLASTY;  Surgeon: [name], MD;  Location: [facility];  Service: Orthopedics;  Laterality: Right;  . TUBAL LIGATION  1977   Social History   Occupational History  . 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 Smoking status: Former Smoker    Types: Cigarettes    Quit date: 03/23/1976    Years since quitting: 43.5  . Smokeless tobacco: Never Used  Vaping Use  . Vaping Use: Never used  Substance and Sexual Activity  . Alcohol use: No  . Drug use: No  . Sexual activity: Yes    Birth control/protection: Abstinence

## 2019-10-07 ENCOUNTER — Ambulatory Visit: Payer: PPO | Admitting: Family Medicine

## 2019-10-09 ENCOUNTER — Other Ambulatory Visit: Payer: Self-pay

## 2019-10-09 ENCOUNTER — Encounter: Payer: Self-pay | Admitting: Family Medicine

## 2019-10-10 ENCOUNTER — Encounter: Payer: Self-pay | Admitting: Family Medicine

## 2019-10-10 ENCOUNTER — Ambulatory Visit (INDEPENDENT_AMBULATORY_CARE_PROVIDER_SITE_OTHER): Payer: PPO | Admitting: Family Medicine

## 2019-10-10 VITALS — BP 118/70 | HR 80 | Temp 97.1°F | Ht 64.0 in | Wt 157.8 lb

## 2019-10-10 DIAGNOSIS — Z23 Encounter for immunization: Secondary | ICD-10-CM | POA: Diagnosis not present

## 2019-10-10 DIAGNOSIS — E039 Hypothyroidism, unspecified: Secondary | ICD-10-CM

## 2019-10-10 DIAGNOSIS — E876 Hypokalemia: Secondary | ICD-10-CM

## 2019-10-10 LAB — BASIC METABOLIC PANEL
BUN: 15 mg/dL (ref 6–23)
CO2: 25 mEq/L (ref 19–32)
Calcium: 8.6 mg/dL (ref 8.4–10.5)
Chloride: 110 mEq/L (ref 96–112)
Creatinine, Ser: 1.12 mg/dL (ref 0.40–1.20)
GFR: 48.15 mL/min — ABNORMAL LOW (ref >60.00)
Glucose, Bld: 95 mg/dL (ref 70–99)
Potassium: 3.4 mEq/L — ABNORMAL LOW (ref 3.5–5.1)
Sodium: 139 mEq/L (ref 135–145)

## 2019-10-10 MED ORDER — LEVOTHYROXINE SODIUM 112 MCG PO TABS: 112.0000 ug | ORAL_TABLET | Freq: Every day | ORAL | 1 refills | Status: DC

## 2019-10-10 NOTE — Progress Notes (Signed)
Established Patient Office Visit  Subjective:  Patient ID: Megan Valentine, female    DOB: 1950/01/27  Age: 69 y.o. MRN: [id number]  CC:  Chief Complaint  Patient presents with  . Advice Only    discuss labs, pt wouldl like synthroid increased.     HPI [name] presents for follow-up of hypothyroidism and hypokalemia.  Hyper kalemia is due to Lasix therapy for lower extremity swelling.  She recently restarted the potassium pills but Lasix has been discontinued because of the hypokalemia.  She does not have hypertension.  There is no other reason for her to be taking Lasix.  Patient's TSH is in the normal range at 2.75 but she still feels as though she is hypothyroid.  She tells of coarsening of her hair with hair loss fatigue and joint aches.  She has to have her levothyroxine increased.  Past Medical History:  Diagnosis Date  . Allergy   . Arthritis   . Cataract   . Chronic bronchitis ([place])   . Complication of anesthesia    hard to wake up  . Diverticulosis   . Environmental allergies   . GERD (gastroesophageal reflux disease)   . Hiatal hernia   . History of chicken pox   . Hyperlipidemia   . Hypothyroidism   . IBS (irritable bowel syndrome)   . Migraines   . Mitral valve prolapse   . Rectal prolapse     Past Surgical History:  Procedure Laterality Date  . ABDOMINAL HYSTERECTOMY  1999  . APPENDECTOMY  1962  . BREAST CYST ASPIRATION Bilateral   . CHOLECYSTECTOMY N/A 12/17/2018   Procedure: LAPAROSCOPIC CHOLECYSTECTOMY;  Surgeon: Clovis Riley, MD;  Location: [facility];  Service: General;  Laterality: N/A;  . EYE SURGERY    . JOINT REPLACEMENT     rt knee scope  . REFRACTIVE SURGERY  2000   both eyes  . TOTAL KNEE ARTHROPLASTY Right 03/24/2015   Procedure: TOTAL KNEE ARTHROPLASTY;  Surgeon: [name], MD;  Location: [facility];  Service: Orthopedics;  Laterality: Right;  . TUBAL LIGATION  1977    Family History  Problem Relation Age of Onset  . Stomach  cancer Maternal Grandmother   . Hypertension Maternal Grandmother   . Diabetes Maternal Grandmother   . Stroke Maternal Grandmother   . Cancer Maternal Grandmother        Stomach cancer  . Colon cancer Paternal Grandfather   . Heart attack Paternal Grandfather   . Hyperlipidemia Mother   . Diabetes Mother   . Hypertension Mother   . Alzheimer's disease Mother   . Diabetes Father   . Hyperlipidemia Father   . Heart disease Father   . Heart attack Father   . Stroke Paternal Grandmother     Social History   Socioeconomic History  . Marital status: Widowed    Spouse name: Not on file  . Number of children: Not on file  . Years of education: 30  . Highest education level: Not on file  Occupational History  . 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 Smoking status: Former Smoker    Types: Cigarettes    Quit date: 03/23/1976    Years since quitting: 43.5  . Smokeless tobacco: Never Used  Vaping Use  . Vaping Use: Never used  Substance and Sexual Activity  . Alcohol use: No  . Drug use: No  . Sexual  activity: Yes    Birth control/protection: Abstinence  Other Topics Concern  . Not on file  Social History Narrative   Caffeine Use-yes   Regular exercise-no   Right handed    Lives alone   Social Determinants of Health   Financial Resource Strain: Low Risk   . Difficulty of Paying Living Expenses: Not hard at all  Food Insecurity:   . Worried About Charity fundraiser in the Last Year: Not on file  . Ran Out of Food in the Last Year: Not on file  Transportation Needs: No Transportation Needs  . Lack of Transportation (Medical): No  . Lack of Transportation (Non-Medical): No  Physical Activity:   . Days of Exercise per Week: Not on file  . Minutes of Exercise per Session: Not on file  Stress:   . Feeling of Stress : Not on file  Social Connections:   . Frequency of Communication with Friends and Family: Not on  file  . Frequency of Social Gatherings with Friends and Family: Not on file  . Attends Religious Services: Not on file  . Active Member of Clubs or Organizations: Not on file  . Attends Archivist Meetings: Not on file  . Marital Status: Not on file  Intimate Partner Violence:   . Fear of Current or Ex-Partner: Not on file  . Emotionally Abused: Not on file  . Physically Abused: Not on file  . Sexually Abused: Not on file    Outpatient Medications Prior to Visit  Medication Sig Dispense Refill  . acetaminophen (TYLENOL) 500 MG tablet Take 1,000 mg by mouth every 6 (six) hours as needed for mild pain or headache.     . albuterol (VENTOLIN HFA) 108 (90 Base) MCG/ACT inhaler INHALE 2 PUFFS INTO THE LUNGS EVERY 6 HOURS AS NEEDED FOR WHEEZING OR SHORTNESS OF BREATH 8.5 g 2  . aspirin 81 MG chewable tablet Chew 81 mg daily by mouth.    . Biotin 1 MG CAPS Take 1 mg by mouth daily.    . Brimonidine Tartrate (LUMIFY) 0.025 % SOLN Apply 1 drop to eye 2 (two) times daily. Both eyes    . Cranberry-Vitamin C-Vitamin E (CRANBERRY PLUS VITAMIN C) 4200-20-3 MG-MG-UNIT CAPS Take 2 capsules by mouth daily.    Marland Kitchen estradiol (ESTRACE) 0.5 MG tablet TAKE 1 TABLET(0.5 MG) BY MOUTH DAILY 90 tablet 2  . ferrous sulfate 325 (65 FE) MG tablet Take 1 tablet (325 mg total) by mouth daily with breakfast. (Patient taking differently: Take 175 mg by mouth 3 (three) times a week. Mondays, Wednesdays, and Fridays) 30 tablet 3  . hydroxypropyl methylcellulose / hypromellose (ISOPTO TEARS / GONIOVISC) 2.5 % ophthalmic solution Place 1 drop into both eyes 3 (three) times daily as needed for dry eyes.    . pantoprazole (PROTONIX) 20 MG tablet TAKE 1 TABLET(20 MG) BY MOUTH DAILY 90 tablet 1  . potassium chloride SA (KLOR-CON) 20 MEQ tablet TAKE 1 TABLET BY MOUTH DAILY WITH LASIX 90 tablet 0  . RESTASIS 0.05 % ophthalmic emulsion 1 drop 2 (two) times daily.    . Senna-Fennel (NATURAL VEGETABLE LAXATIVE) TABS Take 8  tablets by mouth at bedtime.    . sodium chloride (OCEAN) 0.65 % SOLN nasal spray Place 1 spray into both nostrils as needed for congestion.    Marland Kitchen levothyroxine (SYNTHROID) 100 MCG tablet TAKE 1 TABLET BY MOUTH DAILY 90 tablet 2  . fluticasone (FLONASE) 50 MCG/ACT nasal spray Place 1 spray into both  nostrils daily as needed for allergies or rhinitis. (Patient not taking: Reported on 10/10/2019)    . amoxicillin (AMOXIL) 500 MG tablet TAKE 4 TABLETS BY MOUTH 1 HOUR PRIOR TO APPOINTMENT (Patient not taking: Reported on 10/10/2019)    . Cholecalciferol (VITAMIN D) 125 MCG (5000 UT) CAPS Take 5,000 Units by mouth daily.  (Patient not taking: Reported on 10/10/2019)    . furosemide (LASIX) 20 MG tablet TAKE 1 TABLET(20 MG) BY MOUTH DAILY AS NEEDED (Patient not taking: Reported on 10/10/2019) 90 tablet 0   No facility-administered medications prior to visit.    Allergies  Allergen Reactions  . Tetracycline Hives and Itching  . Dilaudid [Hydromorphone Hcl] Nausea And Vomiting  . Other     Cigarette smoke and perfumes    ROS Review of Systems  Constitutional: Positive for fatigue. Negative for diaphoresis, fever and unexpected weight change.  HENT: Negative.   Eyes: Negative for photophobia and visual disturbance.  Respiratory: Negative.   Cardiovascular: Negative.   Gastrointestinal: Negative.   Endocrine: Negative for cold intolerance and heat intolerance.  Genitourinary: Negative.   Musculoskeletal: Positive for arthralgias.  Psychiatric/Behavioral: Negative.       Objective:    Physical Exam Vitals and nursing note reviewed.  Constitutional:      General: She is not in acute distress.    Appearance: Normal appearance. She is not ill-appearing, toxic-appearing or diaphoretic.  HENT:     Head: Normocephalic and atraumatic.     Right Ear: External ear normal.     Left Ear: External ear normal.  Eyes:     General: No scleral icterus.       Right eye: No discharge.        Left eye:  No discharge.     Extraocular Movements: Extraocular movements intact.     Conjunctiva/sclera: Conjunctivae normal.     Pupils: Pupils are equal, round, and reactive to light.  Cardiovascular:     Rate and Rhythm: Normal rate and regular rhythm.  Pulmonary:     Effort: Pulmonary effort is normal.     Breath sounds: Normal breath sounds.  Abdominal:     General: Bowel sounds are normal.  Neurological:     Mental Status: She is alert and oriented to person, place, and time.  Psychiatric:        Mood and Affect: Mood normal.        Behavior: Behavior normal.     BP 118/70   Pulse 80   Temp (!) 97.1 F (36.2 C) (Tympanic)   Ht 5\' 4"  (1.626 m)   Wt 157 lb 12.8 oz (71.6 kg)   SpO2 97%   BMI 27.09 kg/m  Wt Readings from Last 3 Encounters:  10/10/19 157 lb 12.8 oz (71.6 kg)  10/01/19 153 lb (69.4 kg)  09/24/19 153 lb (69.4 kg)     There are no preventive care reminders to display for this patient.  There are no preventive care reminders to display for this patient.  Lab Results  Component Value Date   TSH 2.75 08/15/2019   Lab Results  Component Value Date   WBC 6.4 08/15/2019   HGB 13.5 08/15/2019   HCT 40.0 08/15/2019   MCV 95.0 08/15/2019   PLT 292.0 08/15/2019   Lab Results  Component Value Date   NA 140 08/15/2019   K 3.3 (L) 08/15/2019   CO2 26 08/15/2019   GLUCOSE 112 (H) 08/15/2019   BUN 17 08/15/2019   CREATININE 0.94 08/15/2019  BILITOT 0.4 08/15/2019   ALKPHOS 68 08/15/2019   AST 14 08/15/2019   ALT 11 08/15/2019   PROT 6.5 08/15/2019   ALBUMIN 4.0 08/15/2019   CALCIUM 9.0 08/15/2019   ANIONGAP 5 12/17/2018   GFR 58.97 (L) 08/15/2019   Lab Results  Component Value Date   CHOL 213 (H) 10/19/2017   Lab Results  Component Value Date   HDL 75.50 10/19/2017   Lab Results  Component Value Date   LDLCALC 117 (H) 10/19/2017   Lab Results  Component Value Date   TRIG 98.0 10/19/2017   Lab Results  Component Value Date   CHOLHDL 3  10/19/2017   Lab Results  Component Value Date   HGBA1C 5.8 08/15/2019      Assessment & Plan:   Problem List Items Addressed This Visit      Endocrine   Hypothyroidism   Relevant Medications   levothyroxine (SYNTHROID) 112 MCG tablet     Other   Hypokalemia   Need for influenza vaccination - Primary   Relevant Orders   Flu Vaccine QUAD High Dose(Fluad) (Completed)      Meds ordered this encounter  Medications  . levothyroxine (SYNTHROID) 112 MCG tablet    Sig: Take 1 tablet (112 mcg total) by mouth daily.    Dispense:  90 tablet    Refill:  1    Follow-up: Return in about 2 months (around 12/10/2019).   Rechecking potassium today.  With discontinuation of Lasix patient should not need supplemental potassium.  We will go ahead and increase the levothyroxine to 112 mcg daily.  She has been taking 168mcg on a fasting stomach an hour before eating.  Recheck TSH in 2 months. Libby Maw, MD

## 2019-10-14 ENCOUNTER — Encounter: Payer: Self-pay | Admitting: Family Medicine

## 2019-10-14 ENCOUNTER — Telehealth: Payer: Self-pay

## 2019-10-14 NOTE — Progress Notes (Signed)
Subjective:   Megan Valentine is a 69 y.o. female who presents for Medicare Annual (Subsequent) preventive examination.  I connected with Megan Valentine today by telephone and verified that I am speaking with the correct person using two identifiers. Location patient: home Location provider: work Persons participating in the virtual visit: patient, Marine scientist.    I discussed the limitations, risks, security and privacy concerns of performing an evaluation and management service by telephone and the availability of in person appointments. I also discussed with the patient that there may be a patient responsible charge related to this service. The patient expressed understanding and verbally consented to this telephonic visit.    Interactive audio and video telecommunications were attempted between this provider and patient, however failed, due to patient having technical difficulties OR patient did not have access to video capability.  We continued and completed visit with audio only.  Some vital signs may be absent or patient reported.   Time Spent with patient on telephone encounter: 20 minutes  Review of Systems     Cardiac Risk Factors include: advanced age (>35men, >62 women);dyslipidemia;hypertension     Objective:    Today's Vitals   10/15/19 1545  Weight: 157 lb (71.2 kg)  Height: 5\' 4"  (1.626 m)   Body mass index is 26.95 kg/m.  Advanced Directives 10/15/2019 07/11/2019 12/14/2018 05/30/2018 09/25/2016 03/13/2015  Does Patient Have a Medical Advance Directive? Yes Yes No Yes No Yes  Type of Paramedic of [place];Living will [place];Living will - [place];Living will - -  Does patient want to make changes to medical advance directive? - - - No - Patient declined - -  Copy of [place] in Chart? Yes - validated most recent copy scanned in chart (See row information) - - Yes - validated most recent copy scanned  in chart (See row information) - No - copy requested  Would patient like information on creating a medical advance directive? - - No - Patient declined - - -    Current Medications (verified) Outpatient Encounter Medications as of 10/15/2019  Medication Sig  . acetaminophen (TYLENOL) 500 MG tablet Take 1,000 mg by mouth every 6 (six) hours as needed for mild pain or headache.   . albuterol (VENTOLIN HFA) 108 (90 Base) MCG/ACT inhaler INHALE 2 PUFFS INTO THE LUNGS EVERY 6 HOURS AS NEEDED FOR WHEEZING OR SHORTNESS OF BREATH  . aspirin 81 MG chewable tablet Chew 81 mg daily by mouth.  . Biotin 1 MG CAPS Take 1 mg by mouth daily.  . Brimonidine Tartrate (LUMIFY) 0.025 % SOLN Apply 1 drop to eye 2 (two) times daily. Both eyes  . Cranberry-Vitamin C-Vitamin E (CRANBERRY PLUS VITAMIN C) 4200-20-3 MG-MG-UNIT CAPS Take 2 capsules by mouth daily.  Marland Kitchen estradiol (ESTRACE) 0.5 MG tablet TAKE 1 TABLET(0.5 MG) BY MOUTH DAILY  . ferrous sulfate 325 (65 FE) MG tablet Take 1 tablet (325 mg total) by mouth daily with breakfast. (Patient taking differently: Take 175 mg by mouth 3 (three) times a week. Mondays, Wednesdays, and Fridays)  . fluticasone (FLONASE) 50 MCG/ACT nasal spray Place 1 spray into both nostrils daily as needed for allergies or rhinitis.   . hydroxypropyl methylcellulose / hypromellose (ISOPTO TEARS / GONIOVISC) 2.5 % ophthalmic solution Place 1 drop into both eyes 3 (three) times daily as needed for dry eyes.  Marland Kitchen levothyroxine (SYNTHROID) 112 MCG tablet Take 1 tablet (112 mcg total) by mouth daily.  Marland Kitchen  pantoprazole (PROTONIX) 20 MG tablet TAKE 1 TABLET(20 MG) BY MOUTH DAILY  . RESTASIS 0.05 % ophthalmic emulsion 1 drop 2 (two) times daily.  . Senna-Fennel (NATURAL VEGETABLE LAXATIVE) TABS Take 8 tablets by mouth at bedtime.  . sodium chloride (OCEAN) 0.65 % SOLN nasal spray Place 1 spray into both nostrils as needed for congestion.  . [DISCONTINUED] potassium chloride SA (KLOR-CON) 20 MEQ tablet  TAKE 1 TABLET BY MOUTH DAILY WITH LASIX   No facility-administered encounter medications on file as of 10/15/2019.    Allergies (verified) Tetracycline, Dilaudid [hydromorphone hcl], and Other   History: Past Medical History:  Diagnosis Date  . Allergy   . Arthritis   . Cataract   . Chronic bronchitis ([place])   . Complication of anesthesia    hard to wake up  . Diverticulosis   . Environmental allergies   . GERD (gastroesophageal reflux disease)   . Hiatal hernia   . History of chicken pox   . Hyperlipidemia   . Hypothyroidism   . IBS (irritable bowel syndrome)   . Migraines   . Mitral valve prolapse   . Rectal prolapse    Past Surgical History:  Procedure Laterality Date  . ABDOMINAL HYSTERECTOMY  1999  . APPENDECTOMY  1962  . BREAST CYST ASPIRATION Bilateral   . CHOLECYSTECTOMY N/A 12/17/2018   Procedure: LAPAROSCOPIC CHOLECYSTECTOMY;  Surgeon: Clovis Riley, MD;  Location: [facility];  Service: General;  Laterality: N/A;  . EYE SURGERY    . JOINT REPLACEMENT     rt knee scope  . REFRACTIVE SURGERY  2000   both eyes  . TOTAL KNEE ARTHROPLASTY Right 03/24/2015   Procedure: TOTAL KNEE ARTHROPLASTY;  Surgeon: [name], MD;  Location: [facility];  Service: Orthopedics;  Laterality: Right;  . TUBAL LIGATION  1977   Family History  Problem Relation Age of Onset  . Stomach cancer Maternal Grandmother   . Hypertension Maternal Grandmother   . Diabetes Maternal Grandmother   . Stroke Maternal Grandmother   . Cancer Maternal Grandmother        Stomach cancer  . Colon cancer Paternal Grandfather   . Heart attack Paternal Grandfather   . Hyperlipidemia Mother   . Diabetes Mother   . Hypertension Mother   . Alzheimer's disease Mother   . Diabetes Father   . Hyperlipidemia Father   . Heart disease Father   . Heart attack Father   . Stroke Paternal Grandmother    Social History   Socioeconomic History  . Marital status: Widowed    Spouse name: Not on file  .  Number of children: Not on file  . Years of education: 39  . Highest education level: Not on file  Occupational History  . 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 Smoking status: Former Smoker    Types: Cigarettes    Quit date: 03/23/1976    Years since quitting: 43.5  . Smokeless tobacco: Never Used  Vaping Use  . Vaping Use: Never used  Substance and Sexual Activity  . Alcohol use: No  . Drug use: No  . Sexual activity: Yes    Birth control/protection: Abstinence  Other Topics Concern  . Not on file  Social History Narrative   Caffeine Use-yes   Regular exercise-no   Right handed    Lives alone   Social Determinants of Health   Financial Resource Strain: Low Risk   .  Difficulty of Paying Living Expenses: Not hard at all  Food Insecurity: No Food Insecurity  . Worried About Charity fundraiser in the Last Year: Never true  . Ran Out of Food in the Last Year: Never true  Transportation Needs: No Transportation Needs  . Lack of Transportation (Medical): No  . Lack of Transportation (Non-Medical): No  Physical Activity: Inactive  . Days of Exercise per Week: 0 days  . Minutes of Exercise per Session: 0 min  Stress: No Stress Concern Present  . Feeling of Stress : Not at all  Social Connections: Moderately Integrated  . Frequency of Communication with Friends and Family: More than three times a week  . Frequency of Social Gatherings with Friends and Family: Once a week  . Attends Religious Services: 1 to 4 times per year  . Active Member of Clubs or Organizations: Yes  . Attends Archivist Meetings: Never  . Marital Status: Widowed    Tobacco Counseling Counseling given: Not Answered   Clinical Intake:  Pre-visit preparation completed: Yes  Pain : No/denies pain     Nutritional Status: BMI 25 -29 Overweight Nutritional Risks: None Diabetes: No  How often do you need to have someone help you  when you read instructions, pamphlets, or other written materials from your doctor or pharmacy?: 1 - Never What is the last grade level you completed in school?: 12th grade  Diabetic?No  Interpreter Needed?: No  Information entered by :: Caroleen Hamman LPN   Activities of Daily Living In your present state of health, do you have any difficulty performing the following activities: 10/15/2019 12/15/2018  Hearing? N N  Vision? N N  Difficulty concentrating or making decisions? Y N  Comment occasionally forgets names -  Walking or climbing stairs? N N  Dressing or bathing? N N  Doing errands, shopping? N N  Preparing Food and eating ? N -  Using the Toilet? N -  In the past six months, have you accidently leaked urine? Y -  Comment wears a pad -  Do you have problems with loss of bowel control? N -  Managing your Medications? N -  Managing your Finances? N -  Housekeeping or managing your Housekeeping? N -  Some recent data might be hidden    Patient Care Team: Libby Maw, MD as PCP - General (Family Medicine) Germaine Pomfret, [facility] as Pharmacist (Pharmacist) Cameron Sprang, MD as Consulting Physician (Neurology)  Indicate any recent Medical Services you may have received from other than [facility] providers in the past year (date may be approximate).     Assessment:   This is a routine wellness examination for Dayne.  Hearing/Vision screen  Hearing Screening   125Hz  250Hz  500Hz  1000Hz  2000Hz  3000Hz  4000Hz  6000Hz  8000Hz   Right ear:           Left ear:           Comments: No issues  Vision Screening Comments: Wears glasses Last eye exam-07/2019-Dr. Monica Martinez  Dietary issues and exercise activities discussed: Current Exercise Habits: The patient does not participate in regular exercise at present, Exercise limited by: orthopedic condition(s)  Goals    . Chronic Care Management     CARE PLAN ENTRY  Current Barriers:  . Chronic Disease Management support,  education, and care coordination needs related to Hyperlipidemia, Chronic Kidney Disease, and Hypothyroidism   Hyperlipidemia . Pharmacist Clinical Goal(s): o Over the next 180 days, patient will work with PharmD and  providers to achieve LDL goal < 100 . Current regimen:  o None . Interventions: o Recommend increasing physical activity slowly aiming for 150 minutes of moderate-intensity exercise weekly o Recommend following the DASH or Mediterranean diet  Hypothyroidism . Pharmacist Clinical Goal(s) o Over the next 180 days, patient will work with PharmD and providers to maintain stable thyroid function . Current regimen:  o Levothyroxine 100 mcg . Patient self care activities - Over the next 180 days, patient will: o Continue to separate levothyroxine from food and other medications by at least 30 minutes  o Separate levothyroxine from iron or calcium supplements by at least 4 hours   Medication management . Pharmacist Clinical Goal(s): o Over the next 180 days, patient will work with PharmD and providers to maintain optimal medication adherence . Current pharmacy: [facility] . Interventions o Comprehensive medication review performed. o Continue current medication management strategy . Patient self care activities - Over the next 180 days, patient will: o Take medications as prescribed o Report any questions or concerns to PharmD and/or provider(s)    . Patient Stated     Maintain cuirrent healthy eating plan      Depression Screen PHQ 2/9 Scores 10/15/2019 02/15/2019 05/30/2018 12/14/2017 09/09/2016  PHQ - 2 Score 0 0 0 0 0  PHQ- 9 Score - - - 3 -    Fall Risk Fall Risk  10/15/2019 07/11/2019 02/15/2019 05/30/2018 09/09/2016  Falls in the past year? 1 1 0 0 No  Number falls in past yr: 0 0 - - -  Injury with Fall? 1 1 - - -  Risk for fall due to : History of fall(s) - - - -  Follow up Falls prevention discussed - - - -    Any stairs in or around the home? No  Home free of  loose throw rugs in walkways, pet beds, electrical cords, etc? Yes  Adequate lighting in your home to reduce risk of falls? Yes   ASSISTIVE DEVICES UTILIZED TO PREVENT FALLS:  Life alert? No  Use of a cane, walker or w/c? No  Grab bars in the bathroom? Yes  Shower chair or bench in shower? No  Elevated toilet seat or a handicapped toilet? No   TIMED UP AND GO:  Was the test performed? No . Phone visit   Cognitive Function:No cognitive impairment noted     6CIT Screen 10/15/2019  What Year? 0 points  What month? 0 points  What time? 0 points  Count back from 20 0 points  Months in reverse 0 points  Repeat phrase 0 points  Total Score 0    Immunizations Immunization History  Administered Date(s) Administered  . Fluad Quad(high Dose 65+) 10/05/2018, 10/10/2019  . Influenza Whole 10/09/2015  . Influenza, High Dose Seasonal PF 10/19/2016, 10/19/2017  . Influenza,inj,Quad PF,6+ Mos 11/07/2013, 11/04/2014  . Influenza-Unspecified 10/25/2010  . Moderna SARS-COVID-2 Vaccination 03/07/2019, 04/05/2019  . Pneumococcal Conjugate-13 06/08/2014  . Pneumococcal Polysaccharide-23 07/09/2015  . Tdap 11/25/2002, 03/24/2013  . Zoster 08/08/2015  . Zoster Recombinat (Shingrix) 10/24/2016, 12/30/2016    TDAP status: Up to date   Flu Vaccine status: Up to date   Pneumococcal vaccine status: Up to date   Covid-19 vaccine status: Completed vaccines  Qualifies for Shingles Vaccine? No   Zostavax completed Yes   Shingrix Completed?: Yes  Screening Tests Health Maintenance  Topic Date Due  . COLONOSCOPY  04/05/2021  . MAMMOGRAM  09/08/2021  . TETANUS/TDAP  03/25/2023  . INFLUENZA VACCINE  Completed  . DEXA SCAN  Completed  . COVID-19 Vaccine  Completed  . Hepatitis C Screening  Completed  . PNA vac Low Risk Adult  Completed    Health Maintenance  There are no preventive care reminders to display for this patient.  Colorectal cancer screening: Completed 04/06/2011. Repeat  every 10 years   Mammogram status: Completed 09/09/2019. Repeat every year   Bone Density status: Ordered today. Pt provided with contact info and advised to call to schedule appt.  Lung Cancer Screening: (Low Dose CT Chest recommended if Age 22-80 years, 30 pack-year currently smoking OR have quit w/in 15years.) does not qualify.    Additional Screening:  Hepatitis C Screening:Completed 09/11/2015  Vision Screening: Recommended annual ophthalmology exams for early detection of glaucoma and other disorders of the eye. Is the patient up to date with their annual eye exam?  Yes  Who is the provider or what is the name of the office in which the patient attends annual eye exams? Dr. Monica Martinez   Dental Screening: Recommended annual dental exams for proper oral hygiene  Community Resource Referral / Chronic Care Management: CRR required this visit?  No   CCM required this visit?  No      Plan:     I have personally reviewed and noted the following in the patient's chart:   . Medical and social history . Use of alcohol, tobacco or illicit drugs  . Current medications and supplements . Functional ability and status . Nutritional status . Physical activity . Advanced directives . List of other physicians . Hospitalizations, surgeries, and ER visits in previous 12 months . Vitals . Screenings to include cognitive, depression, and falls . Referrals and appointments  In addition, I have reviewed and discussed with patient certain preventive protocols, quality metrics, and best practice recommendations. A written personalized care plan for preventive services as well as general preventive health recommendations were provided to patient.  Due to this being a telephonic visit, the after visit summary with patients personalized plan was offered to patient via mail or my-chart. Patient would like to access on my-chart.  Marta Antu, LPN   06/01/3265  Nurse Health Advisor  Nurse  Notes: None

## 2019-10-14 NOTE — Telephone Encounter (Signed)
Called patient in regards to her labs.  She already has an appointment in 6 weeks for f/u. She understands and has no further questions. Dm/cma

## 2019-10-15 ENCOUNTER — Ambulatory Visit (INDEPENDENT_AMBULATORY_CARE_PROVIDER_SITE_OTHER): Payer: PPO

## 2019-10-15 VITALS — Ht 64.0 in | Wt 157.0 lb

## 2019-10-15 DIAGNOSIS — Z8639 Personal history of other endocrine, nutritional and metabolic disease: Secondary | ICD-10-CM

## 2019-10-15 DIAGNOSIS — Z780 Asymptomatic menopausal state: Secondary | ICD-10-CM

## 2019-10-15 DIAGNOSIS — Z0000 Encounter for general adult medical examination without abnormal findings: Secondary | ICD-10-CM | POA: Diagnosis not present

## 2019-10-15 NOTE — Patient Instructions (Signed)
Megan Valentine , Thank you for taking time to complete your Medicare Wellness Visit. I appreciate your ongoing commitment to your health goals. Please review the following plan we discussed and let me know if I can assist you in the future.   Screening recommendations/referrals: Colonoscopy: Completed 04/06/2011-Due 04/05/2021 Mammogram: Completed 09/09/2019- Due 09/08/2020 Bone Density: Ordered today-Someone will be calling you to schedule. Recommended yearly ophthalmology/optometry visit for glaucoma screening and checkup Recommended yearly dental visit for hygiene and checkup  Vaccinations: Influenza vaccine: Up to Date Pneumococcal vaccine: Completed vaccines Tdap vaccine: Up to Date- Due-03/25/2023 Shingles vaccine: Completed vaccines  Covid-19:Completed vaccines  Advanced directives: Copy on file  Conditions/risks identified: See problem list  Next appointment: Follow up in one year for your annual wellness visit    Preventive Care 69 Years and Older, Female Preventive care refers to lifestyle choices and visits with your health care provider that can promote health and wellness. What does preventive care include?  A yearly physical exam. This is also called an annual well check.  Dental exams once or twice a year.  Routine eye exams. Ask your health care provider how often you should have your eyes checked.  Personal lifestyle choices, including:  Daily care of your teeth and gums.  Regular physical activity.  Eating a healthy diet.  Avoiding tobacco and drug use.  Limiting alcohol use.  Practicing safe sex.  Taking low-dose aspirin every day.  Taking vitamin and mineral supplements as recommended by your health care provider. What happens during an annual well check? The services and screenings done by your health care provider during your annual well check will depend on your age, overall health, lifestyle risk factors, and family history of disease. Counseling    Your health care provider may ask you questions about your:  Alcohol use.  Tobacco use.  Drug use.  Emotional well-being.  Home and relationship well-being.  Sexual activity.  Eating habits.  History of falls.  Memory and ability to understand (cognition).  Work and work Statistician.  Reproductive health. Screening  You may have the following tests or measurements:  Height, weight, and BMI.  Blood pressure.  Lipid and cholesterol levels. These may be checked every 5 years, or more frequently if you are over 69 years old.  Skin check.  Lung cancer screening. You may have this screening every year starting at age 69 if you have a 30-pack-year history of smoking and currently smoke or have quit within the past 15 years.  Fecal occult blood test (FOBT) of the stool. You may have this test every year starting at age 69.  Flexible sigmoidoscopy or colonoscopy. You may have a sigmoidoscopy every 5 years or a colonoscopy every 10 years starting at age 69.  Hepatitis C blood test.  Hepatitis B blood test.  Sexually transmitted disease (STD) testing.  Diabetes screening. This is done by checking your blood sugar (glucose) after you have not eaten for a while (fasting). You may have this done every 1-3 years.  Bone density scan. This is done to screen for osteoporosis. You may have this done starting at age 69.  Mammogram. This may be done every 1-2 years. Talk to your health care provider about how often you should have regular mammograms. Talk with your health care provider about your test results, treatment options, and if necessary, the need for more tests. Vaccines  Your health care provider may recommend certain vaccines, such as:  Influenza vaccine. This is recommended every year.  Tetanus, diphtheria, and acellular pertussis (Tdap, Td) vaccine. You may need a Td booster every 10 years.  Zoster vaccine. You may need this after age 69.  Pneumococcal 13-valent  conjugate (PCV13) vaccine. One dose is recommended after age 69.  Pneumococcal polysaccharide (PPSV23) vaccine. One dose is recommended after age 69. Talk to your health care provider about which screenings and vaccines you need and how often you need them. This information is not intended to replace advice given to you by your health care provider. Make sure you discuss any questions you have with your health care provider. Document Released: 02/06/2015 Document Revised: 09/30/2015 Document Reviewed: 11/11/2014 Elsevier Interactive Patient Education  2017 [employer] Prevention in the Home Falls can cause injuries. They can happen to people of all ages. There are many things you can do to make your home safe and to help prevent falls. What can I do on the outside of my home?  Regularly fix the edges of walkways and driveways and fix any cracks.  Remove anything that might make you trip as you walk through a door, such as a raised step or threshold.  Trim any bushes or trees on the path to your home.  Use bright outdoor lighting.  Clear any walking paths of anything that might make someone trip, such as rocks or tools.  Regularly check to see if handrails are loose or broken. Make sure that both sides of any steps have handrails.  Any raised decks and porches should have guardrails on the edges.  Have any leaves, snow, or ice cleared regularly.  Use sand or salt on walking paths during winter.  Clean up any spills in your garage right away. This includes oil or grease spills. What can I do in the bathroom?  Use night lights.  Install grab bars by the toilet and in the tub and shower. Do not use towel bars as grab bars.  Use non-skid mats or decals in the tub or shower.  If you need to sit down in the shower, use a plastic, non-slip stool.  Keep the floor dry. Clean up any water that spills on the floor as soon as it happens.  Remove soap buildup in the tub or  shower regularly.  Attach bath mats securely with double-sided non-slip rug tape.  Do not have throw rugs and other things on the floor that can make you trip. What can I do in the bedroom?  Use night lights.  Make sure that you have a light by your bed that is easy to reach.  Do not use any sheets or blankets that are too big for your bed. They should not hang down onto the floor.  Have a firm chair that has side arms. You can use this for support while you get dressed.  Do not have throw rugs and other things on the floor that can make you trip. What can I do in the kitchen?  Clean up any spills right away.  Avoid walking on wet floors.  Keep items that you use a lot in easy-to-reach places.  If you need to reach something above you, use a strong step stool that has a grab bar.  Keep electrical cords out of the way.  Do not use floor polish or wax that makes floors slippery. If you must use wax, use non-skid floor wax.  Do not have throw rugs and other things on the floor that can make you trip. What can I do  with my stairs?  Do not leave any items on the stairs.  Make sure that there are handrails on both sides of the stairs and use them. Fix handrails that are broken or loose. Make sure that handrails are as long as the stairways.  Check any carpeting to make sure that it is firmly attached to the stairs. Fix any carpet that is loose or worn.  Avoid having throw rugs at the top or bottom of the stairs. If you do have throw rugs, attach them to the floor with carpet tape.  Make sure that you have a light switch at the top of the stairs and the bottom of the stairs. If you do not have them, ask someone to add them for you. What else can I do to help prevent falls?  Wear shoes that:  Do not have high heels.  Have rubber bottoms.  Are comfortable and fit you well.  Are closed at the toe. Do not wear sandals.  If you use a stepladder:  Make sure that it is fully  opened. Do not climb a closed stepladder.  Make sure that both sides of the stepladder are locked into place.  Ask someone to hold it for you, if possible.  Clearly mark and make sure that you can see:  Any grab bars or handrails.  First and last steps.  Where the edge of each step is.  Use tools that help you move around (mobility aids) if they are needed. These include:  Canes.  Walkers.  Scooters.  Crutches.  Turn on the lights when you go into a dark area. Replace any light bulbs as soon as they burn out.  Set up your furniture so you have a clear path. Avoid moving your furniture around.  If any of your floors are uneven, fix them.  If there are any pets around you, be aware of where they are.  Review your medicines with your doctor. Some medicines can make you feel dizzy. This can increase your chance of falling. Ask your doctor what other things that you can do to help prevent falls. This information is not intended to replace advice given to you by your health care provider. Make sure you discuss any questions you have with your health care provider. Document Released: 11/06/2008 Document Revised: 06/18/2015 Document Reviewed: 02/14/2014 Elsevier Interactive Patient Education  2017 [employer].

## 2019-10-16 DIAGNOSIS — N398 Other specified disorders of urinary system: Secondary | ICD-10-CM | POA: Diagnosis not present

## 2019-10-16 DIAGNOSIS — R350 Frequency of micturition: Secondary | ICD-10-CM | POA: Diagnosis not present

## 2019-10-28 ENCOUNTER — Encounter: Payer: Self-pay | Admitting: Family Medicine

## 2019-10-28 DIAGNOSIS — Z961 Presence of intraocular lens: Secondary | ICD-10-CM | POA: Diagnosis not present

## 2019-10-28 DIAGNOSIS — H40013 Open angle with borderline findings, low risk, bilateral: Secondary | ICD-10-CM | POA: Diagnosis not present

## 2019-10-28 DIAGNOSIS — H16223 Keratoconjunctivitis sicca, not specified as Sjogren's, bilateral: Secondary | ICD-10-CM | POA: Diagnosis not present

## 2019-10-28 NOTE — Telephone Encounter (Signed)
Okay to order test. Usually wellness physical nurse orders it.

## 2019-11-05 ENCOUNTER — Ambulatory Visit (INDEPENDENT_AMBULATORY_CARE_PROVIDER_SITE_OTHER)
Admission: RE | Admit: 2019-11-05 | Discharge: 2019-11-05 | Disposition: A | Discharge status: Home / Self Care | Payer: PPO | Source: Ambulatory Visit | Attending: Family Medicine | Admitting: Family Medicine

## 2019-11-05 ENCOUNTER — Other Ambulatory Visit: Payer: Self-pay

## 2019-11-05 DIAGNOSIS — Z780 Asymptomatic menopausal state: Secondary | ICD-10-CM | POA: Diagnosis not present

## 2019-11-19 ENCOUNTER — Telehealth (INDEPENDENT_AMBULATORY_CARE_PROVIDER_SITE_OTHER): Payer: PPO | Admitting: Family

## 2019-11-19 ENCOUNTER — Encounter: Payer: Self-pay | Admitting: Family

## 2019-11-19 VITALS — BP 117/73 | HR 79 | Temp 98.3°F | Ht 64.0 in | Wt 157.0 lb

## 2019-11-19 DIAGNOSIS — J208 Acute bronchitis due to other specified organisms: Secondary | ICD-10-CM | POA: Diagnosis not present

## 2019-11-19 DIAGNOSIS — R059 Cough, unspecified: Secondary | ICD-10-CM | POA: Diagnosis not present

## 2019-11-19 MED ORDER — BENZONATATE 100 MG PO CAPS: 200.0000 mg | ORAL_CAPSULE | Freq: Two times a day (BID) | ORAL | 0 refills | Status: DC | PRN

## 2019-11-19 MED ORDER — PREDNISONE 10 MG (21) PO TBPK: ORAL_TABLET | ORAL | 0 refills | Status: DC

## 2019-11-19 MED ORDER — PROMETHAZINE-DM 6.25-15 MG/5ML PO SYRP: 5.0000 mL | ORAL_SOLUTION | Freq: Every evening | ORAL | 0 refills | Status: DC | PRN

## 2019-11-19 NOTE — Progress Notes (Signed)
Virtual Visit via Video   I connected with patient on 11/19/19 at  3:20 PM EDT by a video enabled telemedicine application and verified that I am speaking with the correct person using two identifiers.  Location patient: Home Location provider: [facility], Office Persons participating in the virtual visit: Patient, Provider, CMA, Langley Gauss  I discussed the limitations of evaluation and management by telemedicine and the availability of in person appointments. The patient expressed understanding and agreed to proceed.  Subjective:   HPI:   69 year old female present via video visit with concerns of cough x 2 weeks. She has clear phlegm but is growing concerned because she had bronchitis last year that required 3 rounds of Prednisone. She denies fever. Fully vaccinated.   ROS:   See pertinent positives and negatives per HPI.  Patient Active Problem List   Diagnosis Date Noted  . Elevated glucose 08/15/2019  . Anemia 02/15/2019  . Iron deficiency 02/15/2019  . Hospital discharge follow-up 01/04/2019  . Abdominal pain 12/14/2018  . Sinus pressure 11/12/2018  . History of 2019 novel coronavirus disease (COVID-19) 11/12/2018  . Need for influenza vaccination 10/05/2018  . Pleurisy 06/05/2018  . Reactive airway disease 05/07/2018  . Stage 3a chronic kidney disease ([place]) 01/15/2018  . Edema 12/15/2017  . Insomnia 11/20/2017  . Seborrheic dermatitis 11/16/2017  . Seasonal allergic rhinitis due to pollen 11/16/2017  . Viral upper respiratory tract infection 10/19/2017  . Menopausal and female climacteric states 10/19/2017  . Need for vaccination for H flu type B 10/19/2017  . Ceruminosis, right 06/12/2017  . Acute sinusitis 07/02/2015  . Constipation 03/26/2015  . Unilateral primary osteoarthritis, left knee 11/17/2014  . Screening for osteoporosis 04/25/2014  . Hypokalemia 09/15/2011  . Hypothyroidism 08/21/2008  . Mixed hyperlipidemia 08/21/2008  . Mitral valve  disease 08/21/2008    Social History   Tobacco Use  . Smoking status: Former Smoker    Types: Cigarettes    Quit date: 03/23/1976    Years since quitting: 43.6  . Smokeless tobacco: Never Used  Substance Use Topics  . Alcohol use: No    Current Outpatient Medications:  .  acetaminophen (TYLENOL) 500 MG tablet, Take 1,000 mg by mouth every 6 (six) hours as needed for mild pain or headache. , Disp: , Rfl:  .  albuterol (VENTOLIN HFA) 108 (90 Base) MCG/ACT inhaler, INHALE 2 PUFFS INTO THE LUNGS EVERY 6 HOURS AS NEEDED FOR WHEEZING OR SHORTNESS OF BREATH, Disp: 8.5 g, Rfl: 2 .  aspirin 81 MG chewable tablet, Chew 81 mg daily by mouth., Disp: , Rfl:  .  Biotin 1 MG CAPS, Take 1 mg by mouth daily., Disp: , Rfl:  .  Brimonidine Tartrate (LUMIFY) 0.025 % SOLN, Apply 1 drop to eye 2 (two) times daily. Both eyes, Disp: , Rfl:  .  Cranberry-Vitamin C-Vitamin E (CRANBERRY PLUS VITAMIN C) 4200-20-3 MG-MG-UNIT CAPS, Take 2 capsules by mouth daily., Disp: , Rfl:  .  estradiol (ESTRACE) 0.5 MG tablet, TAKE 1 TABLET(0.5 MG) BY MOUTH DAILY (Patient taking differently: 0.5 mg. TAKE 1 TABLET(0.5 MG) BY MOUTH DAILY), Disp: 90 tablet, Rfl: 2 .  ferrous sulfate 325 (65 FE) MG tablet, Take 1 tablet (325 mg total) by mouth daily with breakfast. (Patient taking differently: Take 175 mg by mouth 3 (three) times a week. Mondays, Wednesdays, and Fridays), Disp: 30 tablet, Rfl: 3 .  fluticasone (FLONASE) 50 MCG/ACT nasal spray, Place 1 spray into both nostrils daily as needed for allergies or  rhinitis. , Disp: , Rfl:  .  hydroxypropyl methylcellulose / hypromellose (ISOPTO TEARS / GONIOVISC) 2.5 % ophthalmic solution, Place 1 drop into both eyes 3 (three) times daily as needed for dry eyes., Disp: , Rfl:  .  levothyroxine (SYNTHROID) 112 MCG tablet, Take 1 tablet (112 mcg total) by mouth daily., Disp: 90 tablet, Rfl: 1 .  pantoprazole (PROTONIX) 20 MG tablet, TAKE 1 TABLET(20 MG) BY MOUTH DAILY, Disp: 90 tablet, Rfl:  1 .  RESTASIS 0.05 % ophthalmic emulsion, 1 drop 2 (two) times daily., Disp: , Rfl:  .  Senna-Fennel (NATURAL VEGETABLE LAXATIVE) TABS, Take 8 tablets by mouth at bedtime., Disp: , Rfl:  .  sodium chloride (OCEAN) 0.65 % SOLN nasal spray, Place 1 spray into both nostrils as needed for congestion., Disp: , Rfl:  .  benzonatate (TESSALON PERLES) 100 MG capsule, Take 2 capsules (200 mg total) by mouth 2 (two) times daily as needed for cough (do not take with Promethazine DM)., Disp: 20 capsule, Rfl: 0 .  predniSONE (STERAPRED UNI-PAK 21 TAB) 10 MG (21) TBPK tablet, As directed, Disp: 21 tablet, Rfl: 0 .  promethazine-dextromethorphan (PROMETHAZINE-DM) 6.25-15 MG/5ML syrup, Take 5 mLs by mouth at bedtime as needed for cough., Disp: 118 mL, Rfl: 0  Allergies  Allergen Reactions  . Tetracycline Hives and Itching  . Dilaudid [Hydromorphone Hcl] Nausea And Vomiting  . Other     Cigarette smoke and perfumes    Objective:   BP 117/73   Pulse 79   Temp 98.3 F (36.8 C) (Temporal) Comment: pt reported  Ht 5\' 4"  (1.626 m)   Wt 157 lb (71.2 kg) Comment: pt reported  BMI 26.95 kg/m   Patient is well-developed, well-nourished in no acute distress.  Resting comfortably at home.  Head is normocephalic, atraumatic.  No labored breathing.  Speech is clear and coherent with logical content.  Patient is alert and oriented at baseline.    Assessment and Plan:   Alejah was seen today for acute visit.  Diagnoses and all orders for this visit:  Cough  Acute bronchitis, viral  Other orders -     benzonatate (TESSALON PERLES) 100 MG capsule; Take 2 capsules (200 mg total) by mouth 2 (two) times daily as needed for cough (do not take with Promethazine DM). -     promethazine-dextromethorphan (PROMETHAZINE-DM) 6.25-15 MG/5ML syrup; Take 5 mLs by mouth at bedtime as needed for cough. -     predniSONE (STERAPRED UNI-PAK 21 TAB) 10 MG (21) TBPK tablet; As directed  Call with any questions or  concerns.    Kennyth Arnold, FNP 11/19/2019  .

## 2019-11-26 ENCOUNTER — Other Ambulatory Visit: Payer: Self-pay | Admitting: Family

## 2019-11-27 ENCOUNTER — Encounter: Payer: Self-pay | Admitting: Family Medicine

## 2019-11-27 NOTE — Telephone Encounter (Signed)
Patient called back and stated that she was seen last week with [facility] for Bronchitis  and was given some antibiotics but is still not feeling any better. Patient wanted to see if she can get another round of antibiotics can be called in. CB is [phone]

## 2019-11-28 ENCOUNTER — Encounter: Payer: Self-pay | Admitting: Family Medicine

## 2019-11-28 ENCOUNTER — Telehealth (INDEPENDENT_AMBULATORY_CARE_PROVIDER_SITE_OTHER): Payer: PPO | Admitting: Family Medicine

## 2019-11-28 DIAGNOSIS — R059 Cough, unspecified: Secondary | ICD-10-CM

## 2019-11-28 DIAGNOSIS — J45901 Unspecified asthma with (acute) exacerbation: Secondary | ICD-10-CM

## 2019-11-28 MED ORDER — AMOXICILLIN-POT CLAVULANATE 875-125 MG PO TABS: 1.0000 | ORAL_TABLET | Freq: Two times a day (BID) | ORAL | 0 refills | Status: DC

## 2019-11-28 MED ORDER — FLOVENT HFA 44 MCG/ACT IN AERO: 2.0000 | INHALATION_SPRAY | Freq: Two times a day (BID) | RESPIRATORY_TRACT | 12 refills | Status: DC

## 2019-11-28 MED ORDER — BENZONATATE 100 MG PO CAPS: 100.0000 mg | ORAL_CAPSULE | Freq: Three times a day (TID) | ORAL | 0 refills | Status: DC | PRN

## 2019-11-28 NOTE — Patient Instructions (Addendum)
-  I sent the medication(s) we discussed to your pharmacy: Meds ordered this encounter  Medications  . benzonatate (TESSALON PERLES) 100 MG capsule    Sig: Take 1 capsule (100 mg total) by mouth 3 (three) times daily as needed.    Dispense:  20 capsule    Refill:  0  . amoxicillin-clavulanate (AUGMENTIN) 875-125 MG tablet    Sig: Take 1 tablet by mouth 2 (two) times daily.    Dispense:  14 tablet    Refill:  0  . fluticasone (FLOVENT HFA) 44 MCG/ACT inhaler    Sig: Inhale 2 puffs into the lungs in the morning and at bedtime.    Dispense:  1 each    Refill:  12     I hope you are feeling better soon!  Schedule follow up with your Primary Care office next week.  Seek in person care promptly if your symptoms worsen, new concerns arise or you are not improving with treatment.  It was nice to meet you today. I help [name] out with telemedicine visits on Tuesdays and Thursdays and am available for visits on those days. If you have any concerns or questions following this visit please schedule a follow up visit with your Primary Care doctor or seek care at a local urgent care clinic to avoid delays in care.

## 2019-11-28 NOTE — Progress Notes (Signed)
Virtual Visit via Video Note  I connected with Megan Valentine  on 11/28/19 at  5:20 PM EDT by a video enabled telemedicine application and verified that I am speaking with the correct person using two identifiers.  Location patient: home Location provider:work or home office Persons participating in the virtual visit: patient, provider  I discussed the limitations of evaluation and management by telemedicine and the availability of in person appointments. The patient expressed understanding and agreed to proceed.   HPI:  Acute telemedicine visit for Cough: -Onset: about 2-3 weeks ago -Symptoms include: cough, congestion, some sneezing,  wheezing/SOB rarely - has used her alb a few times which helps  -Denies: fevers, CP, NVD, malaise -Has tried: tessalon and prednisone  -no known sick contacts -Pertinent past medical history: ? Asthma - reports frequently requires prednisone with resp infections (reports required "several rounds of prednisone" this spring when this happened)and sometimes inhalers; reports is allergic to perfumes and smoke  -Pertinent medication allergies: tetracycline and dilauded -COVID-19 vaccine status: had covid last year; fully vaccinated for covid as well  ROS: See pertinent positives and negatives per HPI.  Past Medical History:  Diagnosis Date  . Allergy   . Arthritis   . Cataract   . Chronic bronchitis ([place])   . Complication of anesthesia    hard to wake up  . Diverticulosis   . Environmental allergies   . GERD (gastroesophageal reflux disease)   . Hiatal hernia   . History of chicken pox   . Hyperlipidemia   . Hypothyroidism   . IBS (irritable bowel syndrome)   . Migraines   . Mitral valve prolapse   . Rectal prolapse     Past Surgical History:  Procedure Laterality Date  . ABDOMINAL HYSTERECTOMY  1999  . APPENDECTOMY  1962  . BREAST CYST ASPIRATION Bilateral   . CHOLECYSTECTOMY N/A 12/17/2018   Procedure: LAPAROSCOPIC CHOLECYSTECTOMY;  Surgeon:  Clovis Riley, MD;  Location: [facility];  Service: General;  Laterality: N/A;  . EYE SURGERY    . JOINT REPLACEMENT     rt knee scope  . REFRACTIVE SURGERY  2000   both eyes  . TOTAL KNEE ARTHROPLASTY Right 03/24/2015   Procedure: TOTAL KNEE ARTHROPLASTY;  Surgeon: [name], MD;  Location: [facility];  Service: Orthopedics;  Laterality: Right;  . TUBAL LIGATION  1977     Current Outpatient Medications:  .  acetaminophen (TYLENOL) 500 MG tablet, Take 1,000 mg by mouth every 6 (six) hours as needed for mild pain or headache. , Disp: , Rfl:  .  albuterol (VENTOLIN HFA) 108 (90 Base) MCG/ACT inhaler, INHALE 2 PUFFS INTO THE LUNGS EVERY 6 HOURS AS NEEDED FOR WHEEZING OR SHORTNESS OF BREATH, Disp: 8.5 g, Rfl: 2 .  aspirin 81 MG chewable tablet, Chew 81 mg daily by mouth., Disp: , Rfl:  .  Biotin 1 MG CAPS, Take 1 mg by mouth daily., Disp: , Rfl:  .  Brimonidine Tartrate (LUMIFY) 0.025 % SOLN, Apply 1 drop to eye 2 (two) times daily. Both eyes, Disp: , Rfl:  .  Cranberry-Vitamin C-Vitamin E (CRANBERRY PLUS VITAMIN C) 4200-20-3 MG-MG-UNIT CAPS, Take 2 capsules by mouth daily., Disp: , Rfl:  .  estradiol (ESTRACE) 0.5 MG tablet, TAKE 1 TABLET(0.5 MG) BY MOUTH DAILY (Patient taking differently: 0.5 mg. TAKE 1 TABLET(0.5 MG) BY MOUTH DAILY), Disp: 90 tablet, Rfl: 2 .  ferrous sulfate 325 (65 FE) MG tablet, Take 1 tablet (325 mg total) by mouth daily with  breakfast. (Patient taking differently: Take 175 mg by mouth 3 (three) times a week. Mondays, Wednesdays, and Fridays), Disp: 30 tablet, Rfl: 3 .  fluticasone (FLONASE) 50 MCG/ACT nasal spray, Place 1 spray into both nostrils daily as needed for allergies or rhinitis. , Disp: , Rfl:  .  hydroxypropyl methylcellulose / hypromellose (ISOPTO TEARS / GONIOVISC) 2.5 % ophthalmic solution, Place 1 drop into both eyes 3 (three) times daily as needed for dry eyes., Disp: , Rfl:  .  levothyroxine (SYNTHROID) 112 MCG tablet, Take 1 tablet (112 mcg total) by  mouth daily., Disp: 90 tablet, Rfl: 1 .  pantoprazole (PROTONIX) 20 MG tablet, TAKE 1 TABLET(20 MG) BY MOUTH DAILY, Disp: 90 tablet, Rfl: 1 .  promethazine-dextromethorphan (PROMETHAZINE-DM) 6.25-15 MG/5ML syrup, Take 5 mLs by mouth at bedtime as needed for cough., Disp: 118 mL, Rfl: 0 .  RESTASIS 0.05 % ophthalmic emulsion, 1 drop 2 (two) times daily., Disp: , Rfl:  .  Senna-Fennel (NATURAL VEGETABLE LAXATIVE) TABS, Take 8 tablets by mouth at bedtime., Disp: , Rfl:  .  sodium chloride (OCEAN) 0.65 % SOLN nasal spray, Place 1 spray into both nostrils as needed for congestion., Disp: , Rfl:  .  amoxicillin-clavulanate (AUGMENTIN) 875-125 MG tablet, Take 1 tablet by mouth 2 (two) times daily., Disp: 14 tablet, Rfl: 0 .  benzonatate (TESSALON PERLES) 100 MG capsule, Take 1 capsule (100 mg total) by mouth 3 (three) times daily as needed., Disp: 20 capsule, Rfl: 0 .  fluticasone (FLOVENT HFA) 44 MCG/ACT inhaler, Inhale 2 puffs into the lungs in the morning and at bedtime., Disp: 1 each, Rfl: 12  EXAM:  VITALS per patient if applicable:  GENERAL: alert, oriented, appears well and in no acute distress  HEENT: atraumatic, conjunttiva clear, no obvious abnormalities on inspection of external nose and ears  NECK: normal movements of the head and neck  LUNGS: on inspection no signs of respiratory distress, breathing rate appears normal, no obvious gross SOB, gasping or wheezing, hacking cough at times during the visit  CV: no obvious cyanosis  MS: moves all visible extremities without noticeable abnormality  PSYCH/NEURO: pleasant and cooperative, no obvious depression or anxiety, speech and thought processing grossly intact  ASSESSMENT AND PLAN:  Discussed the following assessment and plan:  Cough  Asthma with acute exacerbation, unspecified asthma severity, unspecified whether persistent  -we discussed possible serious and likely etiologies, options for evaluation and workup, limitations of  telemedicine visit vs in person visit, treatment, treatment risks and precautions. Pt prefers to treat via telemedicine empirically rather than in person at this moment. Query postviral, bronchitis, possible undiagnosed asthma, possible bacterial resp illness given duration and not improving vs other. She opted for treatment with Augmentin 875 bid x 7 days, ICS for 2-4 weeks, prn alb, refill on prednisone. Also, advised follow up with PCP next week to ensure improving. Advised if persistent or recurrent symptoms will need further resp testing, CXR, PFT, etc and may benefit form regular maintenance inhaler.  Work/School slipped offered:  declined Scheduled follow up with PCP offered: pt agrees to call PCP to schedule follow up next week Advised to seek prompt in person care if worsening, new symptoms arise, or if is not improving with treatment. Discussed options for inperson care if PCP office not available. Did let this patient know that I only do telemedicine on Tuesdays and Thursdays for [place]. Advised to schedule follow up visit with PCP or [facility] if any further questions or concerns to avoid delays in  care.   I discussed the assessment and treatment plan with the patient. The patient was provided an opportunity to ask questions and all were answered. The patient agreed with the plan and demonstrated an understanding of the instructions.     Lucretia Kern, DO

## 2019-12-10 ENCOUNTER — Ambulatory Visit: Payer: PPO | Admitting: Family Medicine

## 2019-12-12 ENCOUNTER — Telehealth: Payer: PPO

## 2019-12-26 ENCOUNTER — Other Ambulatory Visit: Payer: Self-pay | Admitting: Family Medicine

## 2019-12-26 NOTE — Telephone Encounter (Signed)
Last VV 11/19/19/[facility] Last fill 07/01/19  #90/1

## 2020-01-27 ENCOUNTER — Ambulatory Visit: Payer: PPO | Admitting: Family Medicine

## 2020-02-17 ENCOUNTER — Ambulatory Visit: Payer: PPO | Admitting: Family Medicine

## 2020-02-18 ENCOUNTER — Ambulatory Visit (INDEPENDENT_AMBULATORY_CARE_PROVIDER_SITE_OTHER): Payer: PPO | Admitting: Family

## 2020-02-18 ENCOUNTER — Other Ambulatory Visit: Payer: Self-pay

## 2020-02-18 ENCOUNTER — Ambulatory Visit: Payer: PPO | Admitting: Family Medicine

## 2020-02-18 ENCOUNTER — Encounter: Payer: Self-pay | Admitting: Family

## 2020-02-18 ENCOUNTER — Encounter: Payer: Self-pay | Admitting: Family Medicine

## 2020-02-18 VITALS — BP 115/70 | HR 68 | Temp 97.0°F | Ht 64.0 in | Wt 162.4 lb

## 2020-02-18 DIAGNOSIS — R609 Edema, unspecified: Secondary | ICD-10-CM | POA: Diagnosis not present

## 2020-02-18 DIAGNOSIS — R7309 Other abnormal glucose: Secondary | ICD-10-CM | POA: Diagnosis not present

## 2020-02-18 DIAGNOSIS — E782 Mixed hyperlipidemia: Secondary | ICD-10-CM | POA: Diagnosis not present

## 2020-02-18 DIAGNOSIS — E039 Hypothyroidism, unspecified: Secondary | ICD-10-CM | POA: Diagnosis not present

## 2020-02-18 DIAGNOSIS — J4531 Mild persistent asthma with (acute) exacerbation: Secondary | ICD-10-CM | POA: Diagnosis not present

## 2020-02-18 LAB — HEMOGLOBIN A1C: Hgb A1c MFr Bld: 5.7 % (ref 4.6–6.5)

## 2020-02-18 LAB — CBC WITH DIFFERENTIAL/PLATELET
Basophils Absolute: 0.1 10*3/uL (ref 0.0–0.1)
Basophils Relative: 1 % (ref 0.0–3.0)
Eosinophils Absolute: 0.3 10*3/uL (ref 0.0–0.7)
Eosinophils Relative: 3.8 % (ref 0.0–5.0)
HCT: 39.7 % (ref 36.0–46.0)
Hemoglobin: 13.2 g/dL (ref 12.0–15.0)
Lymphocytes Relative: 29.3 % (ref 12.0–46.0)
Lymphs Abs: 2 10*3/uL (ref 0.7–4.0)
MCHC: 33.2 g/dL (ref 30.0–36.0)
MCV: 95.1 fl (ref 78.0–100.0)
Monocytes Absolute: 0.6 10*3/uL (ref 0.1–1.0)
Monocytes Relative: 8.8 % (ref 3.0–12.0)
Neutro Abs: 4 10*3/uL (ref 1.4–7.7)
Neutrophils Relative %: 57.1 % (ref 43.0–77.0)
Platelets: 292 10*3/uL (ref 150.0–400.0)
RBC: 4.17 Mil/uL (ref 3.87–5.11)
RDW: 13.3 % (ref 11.5–15.5)
WBC: 7 10*3/uL (ref 4.0–10.5)

## 2020-02-18 LAB — BASIC METABOLIC PANEL
BUN: 21 mg/dL (ref 6–23)
CO2: 25 mEq/L (ref 19–32)
Calcium: 8.7 mg/dL (ref 8.4–10.5)
Chloride: 108 mEq/L (ref 96–112)
Creatinine, Ser: 1.15 mg/dL (ref 0.40–1.20)
GFR: 48.47 mL/min — ABNORMAL LOW (ref >60.00)
Glucose, Bld: 95 mg/dL (ref 70–99)
Potassium: 3.7 mEq/L (ref 3.5–5.1)
Sodium: 140 mEq/L (ref 135–145)

## 2020-02-18 LAB — LIPID PANEL
Cholesterol: 222 mg/dL — ABNORMAL HIGH (ref 0–200)
HDL: 57.1 mg/dL (ref >39.00)
LDL Cholesterol: 133 mg/dL — ABNORMAL HIGH (ref 0–99)
NonHDL: 165.28
Total CHOL/HDL Ratio: 4
Triglycerides: 162 mg/dL — ABNORMAL HIGH (ref 0.0–149.0)
VLDL: 32.4 mg/dL (ref 0.0–40.0)

## 2020-02-18 LAB — TSH: TSH: 1.58 u[IU]/mL (ref 0.35–4.50)

## 2020-02-18 MED ORDER — FLUTICASONE-SALMETEROL 100-50 MCG/DOSE IN AEPB: 1.0000 | INHALATION_SPRAY | Freq: Two times a day (BID) | RESPIRATORY_TRACT | 3 refills | Status: DC

## 2020-02-18 MED ORDER — FUROSEMIDE 20 MG PO TABS: 20.0000 mg | ORAL_TABLET | Freq: Every day | ORAL | 3 refills | Status: DC

## 2020-02-18 NOTE — Progress Notes (Signed)
Established Patient Office Visit  Subjective:  Patient ID: Megan Valentine, female    DOB: Jun 19, 1950  Age: 70 y.o. MRN: [id number]  CC:  Chief Complaint  Patient presents with  . Follow-up    Follow up on labs, no concerns.     HPI 57 [address] Jeffries presents for a follow-up of Hypothyroidism, GERD, Reactive Airway Disease, Peripheral Edema, and Hyperlipidemia. Reports doing well. Patient has concerns that her Flovent co-pay is $45 and wants to try an alternative that may be cheaper. She is not currently exercising. Reports having a bad knee that she plans to have replaced this summer. She occasionally has swelling and has to take a lasix to decrease the fluid in her ankles and feet. One dose typically works well. Thyroid has been stable at 112 mcg. Reports feeling much better at this dose. Was previously on 100 mcg.   Past Medical History:  Diagnosis Date  . Allergy   . Arthritis   . Cataract   . Chronic bronchitis ([place])   . Complication of anesthesia    hard to wake up  . Diverticulosis   . Environmental allergies   . GERD (gastroesophageal reflux disease)   . Hiatal hernia   . History of chicken pox   . Hyperlipidemia   . Hypothyroidism   . IBS (irritable bowel syndrome)   . Migraines   . Mitral valve prolapse   . Rectal prolapse     Past Surgical History:  Procedure Laterality Date  . ABDOMINAL HYSTERECTOMY  1999  . APPENDECTOMY  1962  . BREAST CYST ASPIRATION Bilateral   . CHOLECYSTECTOMY N/A 12/17/2018   Procedure: LAPAROSCOPIC CHOLECYSTECTOMY;  Surgeon: Clovis Riley, MD;  Location: [facility];  Service: General;  Laterality: N/A;  . EYE SURGERY    . JOINT REPLACEMENT     rt knee scope  . REFRACTIVE SURGERY  2000   both eyes  . TOTAL KNEE ARTHROPLASTY Right 03/24/2015   Procedure: TOTAL KNEE ARTHROPLASTY;  Surgeon: [name], MD;  Location: [facility];  Service: Orthopedics;  Laterality: Right;  . TUBAL LIGATION  1977    Family History  Problem Relation Age  of Onset  . Stomach cancer Maternal Grandmother   . Hypertension Maternal Grandmother   . Diabetes Maternal Grandmother   . Stroke Maternal Grandmother   . Cancer Maternal Grandmother        Stomach cancer  . Colon cancer Paternal Grandfather   . Heart attack Paternal Grandfather   . Hyperlipidemia Mother   . Diabetes Mother   . Hypertension Mother   . Alzheimer's disease Mother   . Diabetes Father   . Hyperlipidemia Father   . Heart disease Father   . Heart attack Father   . Stroke Paternal Grandmother     Social History   Socioeconomic History  . Marital status: Widowed    Spouse name: Not on file  . Number of children: Not on file  . Years of education: 59  . Highest education level: Not on file  Occupational History  . 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 Smoking status: Former Smoker    Types: Cigarettes    Quit date: 03/23/1976    Years since quitting: 43.9  . Smokeless tobacco: Never Used  Vaping Use  . Vaping Use: Never used  Substance and Sexual Activity  . Alcohol use: No  . Drug use: No  .  Sexual activity: Yes    Birth control/protection: Abstinence  Other Topics Concern  . Not on file  Social History Narrative   Caffeine Use-yes   Regular exercise-no   Right handed    Lives alone   Social Determinants of Health   Financial Resource Strain: Low Risk   . Difficulty of Paying Living Expenses: Not hard at all  Food Insecurity: No Food Insecurity  . Worried About Charity fundraiser in the Last Year: Never true  . Ran Out of Food in the Last Year: Never true  Transportation Needs: No Transportation Needs  . Lack of Transportation (Medical): No  . Lack of Transportation (Non-Medical): No  Physical Activity: Inactive  . Days of Exercise per Week: 0 days  . Minutes of Exercise per Session: 0 min  Stress: No Stress Concern Present  . Feeling of Stress : Not at all  Social Connections:  Moderately Integrated  . Frequency of Communication with Friends and Family: More than three times a week  . Frequency of Social Gatherings with Friends and Family: Once a week  . Attends Religious Services: 1 to 4 times per year  . Active Member of Clubs or Organizations: Yes  . Attends Archivist Meetings: Never  . Marital Status: Widowed  Intimate Partner Violence: Not At Risk  . Fear of Current or Ex-Partner: No  . Emotionally Abused: No  . Physically Abused: No  . Sexually Abused: No    Outpatient Medications Prior to Visit  Medication Sig Dispense Refill  . acetaminophen (TYLENOL) 500 MG tablet Take 1,000 mg by mouth every 6 (six) hours as needed for mild pain or headache.     . albuterol (VENTOLIN HFA) 108 (90 Base) MCG/ACT inhaler INHALE 2 PUFFS INTO THE LUNGS EVERY 6 HOURS AS NEEDED FOR WHEEZING OR SHORTNESS OF BREATH 8.5 g 2  . amoxicillin-clavulanate (AUGMENTIN) 875-125 MG tablet Take 1 tablet by mouth 2 (two) times daily. 14 tablet 0  . aspirin 81 MG chewable tablet Chew 81 mg daily by mouth.    . Biotin 1 MG CAPS Take 1 mg by mouth daily.    . Brimonidine Tartrate (LUMIFY) 0.025 % SOLN Apply 1 drop to eye 2 (two) times daily. Both eyes    . Cranberry-Vitamin C-Vitamin E (CRANBERRY PLUS VITAMIN C) 4200-20-3 MG-MG-UNIT CAPS Take 2 capsules by mouth daily.    Marland Kitchen estradiol (ESTRACE) 0.5 MG tablet TAKE 1 TABLET(0.5 MG) BY MOUTH DAILY (Patient taking differently: 0.5 mg. TAKE 1 TABLET(0.5 MG) BY MOUTH DAILY) 90 tablet 2  . ferrous sulfate 325 (65 FE) MG tablet Take 1 tablet (325 mg total) by mouth daily with breakfast. (Patient taking differently: Take 175 mg by mouth 3 (three) times a week. Mondays, Wednesdays, and Fridays) 30 tablet 3  . fluticasone (FLONASE) 50 MCG/ACT nasal spray Place 1 spray into both nostrils daily as needed for allergies or rhinitis.    . fluticasone (FLOVENT HFA) 44 MCG/ACT inhaler Inhale 2 puffs into the lungs in the morning and at bedtime. 1 each  12  . hydroxypropyl methylcellulose / hypromellose (ISOPTO TEARS / GONIOVISC) 2.5 % ophthalmic solution Place 1 drop into both eyes 3 (three) times daily as needed for dry eyes.    Marland Kitchen levothyroxine (SYNTHROID) 112 MCG tablet Take 1 tablet (112 mcg total) by mouth daily. 90 tablet 1  . pantoprazole (PROTONIX) 20 MG tablet TAKE 1 TABLET(20 MG) BY MOUTH DAILY 90 tablet 1  . Senna-Fennel (NATURAL VEGETABLE LAXATIVE) TABS Take 8  tablets by mouth at bedtime.    . sodium chloride (OCEAN) 0.65 % SOLN nasal spray Place 1 spray into both nostrils as needed for congestion.    . benzonatate (TESSALON PERLES) 100 MG capsule Take 1 capsule (100 mg total) by mouth 3 (three) times daily as needed. 20 capsule 0  . benzonatate (TESSALON) 100 MG capsule TAKE 2 CAPSULES(200 MG) BY MOUTH TWICE DAILY AS NEEDED FOR COUGH. DO NOT TAKE WITH PROMETHAZINE 20 capsule 0  . promethazine-dextromethorphan (PROMETHAZINE-DM) 6.25-15 MG/5ML syrup Take 5 mLs by mouth at bedtime as needed for cough. (Patient not taking: Reported on 02/18/2020) 118 mL 0  . RESTASIS 0.05 % ophthalmic emulsion 1 drop 2 (two) times daily. (Patient not taking: Reported on 02/18/2020)     No facility-administered medications prior to visit.    Allergies  Allergen Reactions  . Tetracycline Hives and Itching  . Dilaudid [Hydromorphone Hcl] Nausea And Vomiting  . Other     Cigarette smoke and perfumes    ROS Review of Systems  Constitutional: Negative.   HENT: Negative.   Respiratory: Negative.   Cardiovascular: Negative.   Gastrointestinal: Negative.   Endocrine: Negative.   Genitourinary: Negative.   Musculoskeletal: Positive for arthralgias. Negative for back pain, gait problem and myalgias.       Knee pain  Skin: Negative.   Allergic/Immunologic: Negative.   Neurological: Negative.   Psychiatric/Behavioral: Negative.   All other systems reviewed and are negative.     Objective:    Physical Exam Vitals and nursing note reviewed.   Constitutional:      Appearance: Normal appearance.  HENT:     Head: Normocephalic.     Right Ear: Tympanic membrane and ear canal normal.     Left Ear: Tympanic membrane and ear canal normal.  Cardiovascular:     Rate and Rhythm: Normal rate and regular rhythm.     Pulses: Normal pulses.     Heart sounds: Normal heart sounds.  Pulmonary:     Effort: Pulmonary effort is normal.     Breath sounds: Normal breath sounds.  Abdominal:     General: Abdomen is flat.  Musculoskeletal:        General: Normal range of motion.     Cervical back: Normal range of motion and neck supple.  Skin:    General: Skin is warm and dry.  Neurological:     General: No focal deficit present.     Mental Status: She is alert and oriented to person, place, and time.  Psychiatric:        Mood and Affect: Mood normal.        Behavior: Behavior normal.     BP 115/70   Pulse 68   Temp (!) 97 F (36.1 C) (Temporal)   Ht 5\' 4"  (1.626 m)   Wt 162 lb 6.4 oz (73.7 kg)   SpO2 95%   BMI 27.88 kg/m  Wt Readings from Last 3 Encounters:  02/18/20 162 lb 6.4 oz (73.7 kg)  11/19/19 157 lb (71.2 kg)  10/15/19 157 lb (71.2 kg)     There are no preventive care reminders to display for this patient.  There are no preventive care reminders to display for this patient.  Lab Results  Component Value Date   TSH 2.75 08/15/2019   Lab Results  Component Value Date   WBC 6.4 08/15/2019   HGB 13.5 08/15/2019   HCT 40.0 08/15/2019   MCV 95.0 08/15/2019   PLT 292.0 08/15/2019  Lab Results  Component Value Date   NA 139 10/10/2019   K 3.4 (L) 10/10/2019   CO2 25 10/10/2019   GLUCOSE 95 10/10/2019   BUN 15 10/10/2019   CREATININE 1.12 10/10/2019   BILITOT 0.4 08/15/2019   ALKPHOS 68 08/15/2019   AST 14 08/15/2019   ALT 11 08/15/2019   PROT 6.5 08/15/2019   ALBUMIN 4.0 08/15/2019   CALCIUM 8.6 10/10/2019   ANIONGAP 5 12/17/2018   GFR 48.15 (L) 10/10/2019   Lab Results  Component Value Date    CHOL 213 (H) 10/19/2017   Lab Results  Component Value Date   HDL 75.50 10/19/2017   Lab Results  Component Value Date   LDLCALC 117 (H) 10/19/2017   Lab Results  Component Value Date   TRIG 98.0 10/19/2017   Lab Results  Component Value Date   CHOLHDL 3 10/19/2017   Lab Results  Component Value Date   HGBA1C 5.8 08/15/2019      Assessment & Plan:   Problem List Items Addressed This Visit    Reactive airway disease   Relevant Orders   CBC w/Diff   Mixed hyperlipidemia - Primary   Relevant Medications   furosemide (LASIX) 20 MG tablet   Other Relevant Orders   Lipid Panel   CBC w/Diff   Basic Metabolic Panel (BMET)   HgB A1c   Hypothyroidism   Relevant Orders   TSH   CBC w/Diff   Basic Metabolic Panel (BMET)   HgB A1c   Elevated glucose   Relevant Orders   CBC w/Diff   Basic Metabolic Panel (BMET)   HgB A1c    Other Visit Diagnoses    Peripheral edema       Relevant Orders   CBC w/Diff   Basic Metabolic Panel (BMET)      Meds ordered this encounter  Medications  . Fluticasone-Salmeterol (ADVAIR) 100-50 MCG/DOSE AEPB    Sig: Inhale 1 puff into the lungs 2 (two) times daily.    Dispense:  1 each    Refill:  3  . furosemide (LASIX) 20 MG tablet    Sig: Take 1 tablet (20 mg total) by mouth daily.    Dispense:  30 tablet    Refill:  3   Call with any questions or concerns. Follow up pending labs and sooner as needed.  Follow-up: Return in about 6 months (around 08/17/2020).    Kennyth Arnold, FNP

## 2020-02-18 NOTE — Patient Instructions (Signed)
Asthma Attack Prevention, Adult Although you may not be able to control the fact that you have asthma, you can take actions to prevent episodes of asthma (asthma attacks). How can this condition affect me? Asthma attacks (flare ups) can cause trouble breathing, wheezing, and coughing. They may keep you from doing activities you like to do. What can increase my risk? Coming into contact with things that cause asthma symptoms (asthma triggers) can put you at risk for an asthma attack. Common asthma triggers include:  Things you are allergic to (allergens), such as: ? Dust mite and cockroach droppings. ? Pet dander. ? Mold. ? Pollen from trees and grasses. ? Food allergies. This might be a specific food or added chemicals called sulfites.  Irritants, such as: ? Weather changes including very cold, dry, or humid air. ? Smoke. This includes campfire smoke, air pollution, and tobacco smoke. ? Strong odors from aerosol sprays and fumes from perfume, candles, and household cleaners.  Other triggers, such as: ? Certain medicines. This includes NSAIDs, such as ibuprofen and aspirin. ? Viral respiratory infections (colds), including runny nose (rhinitis) or infection in the sinuses (sinusitis). ? Activity including exercise, laughing, or crying. ? Not using inhaled medicines (corticosteroids) as told. What actions can I take to prevent an asthma attack?  Stay healthy. Stay up to date on all immunizations as told by your health care provider, including the yearly flu (influenza) vaccine and pneumonia vaccine.  Many asthma attacks can be prevented by carefully following your written asthma action plan. Follow your asthma action plan Work with your health care provider to create a written asthma action plan. This plan should include:  A list of your asthma triggers and how to avoid or reduce them.  A list of symptoms that you may have during an asthma attack.  Information about which medicine  to take, when to take the medicine, and how much of the medicine to take.  Information to help you understand your peak flow measurements.  Daily actions that you can take to prevent (control) your asthma symptoms.  Contact information for your health care providers.  If you have an asthma attack, act quickly. Follow the emergency steps on your written asthma action plan. This may prevent you from needing to go to the hospital. Monitor your asthma. To do this:  Use your peak flow meter every morning and every evening for 2-3 weeks or as told by your health care provider. ? Record the results in a journal. ? A drop in your peak flow numbers on one or more days may mean that you are starting to have an asthma attack, even if you are not having symptoms.  When you have asthma symptoms, write them down in a journal.  Write down in your journal how often you need to use your fast-acting rescue inhaler. If you are using your rescue inhaler more often, it may mean that your asthma is not under control. Talk with your health care provider about adjusting your asthma treatment plan to help you prevent future asthma attacks and gain better control of your condition.   Lifestyle  Avoid or reduce contact with known outdoor allergens by staying indoors, keeping windows closed, and using air conditioning when pollen and mold counts are high.  Do not use any products that contain nicotine or tobacco, such as cigarettes, e-cigarettes, and chewing tobacco. If you need help quitting, ask your health care provider.  If you are overweight, consider a weight-loss plan.  Find   ways to cope with stress and your feelings, such as mindfulness, relaxation, or breathing exercises.  Ask your health care provider if a breathing exercise program (pulmonary rehabilitation) may be helpful to control symptoms and improve your quality of life. Medicines  Take over-the-counter and prescription medicines only as told by  your health care provider.  Do not stop taking your medicine and do not take less medicine even if you are doing well.  Let your health care provider know: ? How often you use your rescue inhaler. ? How often you have symptoms when you are taking your regular medicines. ? If you wake up at night because of asthma symptoms. ? If you have more trouble with your breathing when you exercise.   Activity  Do your normal activities as told by your health care provider. Ask your health care provider what activities are safe for you.  Some people have asthma symptoms or more asthma symptoms when they exercise. This is called exercise-induced bronchoconstriction (EIB). If you have this problem, talk with your health care provider about how to manage EIB. Some tips to follow include: ? Use your fast-acting inhaler before exercise. ? Exercise indoors if it is very cold, humid, or the pollen and mold counts are high. ? Warm up and cool down before and after exercise. ? Stop exercising right away if your asthma symptoms start or get worse. Where to find more information  Asthma and Allergy Foundation of America: www.aafa.org  Centers for Disease Control and Prevention: www.cdc.gov  American Lung Association: www.lung.org  National Heart, Lung, and Blood Institute: www.nhlbi.nih.gov  World Health Organization: www.who.int Get help right away if:  You have followed your written asthma action plan and your symptoms are not improving. Summary  Asthma attacks (flare ups) can cause trouble breathing, wheezing, and coughing. They may keep you from doing activities you normally like to do.  Work with your health care provider to create a written asthma action plan.  Do not stop taking your medicine and do not take less medicine even if you are doing well.  Do not use any products that contain nicotine or tobacco, such as cigarettes, e-cigarettes, and chewing tobacco. If you need help quitting, ask  your health care provider. This information is not intended to replace advice given to you by your health care provider. Make sure you discuss any questions you have with your health care provider. Document Revised: 01/08/2019 Document Reviewed: 01/08/2019 Elsevier Patient Education  2021 Elsevier Inc.  

## 2020-02-25 ENCOUNTER — Ambulatory Visit: Payer: PPO | Admitting: Orthopaedic Surgery

## 2020-02-25 ENCOUNTER — Encounter: Payer: Self-pay | Admitting: Orthopaedic Surgery

## 2020-02-25 ENCOUNTER — Other Ambulatory Visit: Payer: Self-pay

## 2020-02-25 ENCOUNTER — Ambulatory Visit (INDEPENDENT_AMBULATORY_CARE_PROVIDER_SITE_OTHER): Payer: PPO

## 2020-02-25 VITALS — Ht 64.0 in | Wt 162.0 lb

## 2020-02-25 DIAGNOSIS — M1712 Unilateral primary osteoarthritis, left knee: Secondary | ICD-10-CM

## 2020-02-25 DIAGNOSIS — M25512 Pain in left shoulder: Secondary | ICD-10-CM | POA: Diagnosis not present

## 2020-02-25 MED ORDER — BUPIVACAINE HCL 0.5 % IJ SOLN
2.0000 mL | INTRAMUSCULAR | Status: AC | PRN
  Administered 2020-02-25: 2 mL

## 2020-02-25 NOTE — Progress Notes (Signed)
Office Visit Note   Patient: Megan Valentine           Date of Birth: 09-20-1950           MRN: [id number] Visit Date: 02/25/2020              Requested by: Libby Maw, MD [address] [place],  [place] [address] PCP: Libby Maw, MD   Assessment & Plan: Visit Diagnoses:  1. Acute pain of left shoulder   2. Trigger point of shoulder region, left   3. Unilateral primary osteoarthritis, left knee     Plan: Area of trigger point tenderness along the vertebral border of the left scapula injected with cortisone. Ms. Adolph has end-stage osteoarthritis of her left knee.  She like to schedule the knee replacement.  Long discussion regarding the surgery and need for clearance.  Have also told her that I may or may not be able to perform the surgery depending upon availability of the prosthetic components.  Will refer to one of my associates should there be a problem.  She is aware Follow-Up Instructions: Return We will schedule left total knee replacement after clearance.   Orders:  Orders Placed This Encounter  Procedures  . Trigger Point Inj  . XR Shoulder Left   No orders of the defined types were placed in this encounter.     Procedures: Trigger Point Inj  Date/Time: 02/25/2020 4:56 PM Performed by: [name], MD Authorized by: [name], MD   Total # of Trigger Points:  1 Location: shoulder   Needle Size:  25 G Approach:  Dorsal Medications #1:  2 mL bupivacaine 0.5 % Comments: 6 mg betamethasone injected with Marcaine into area of trigger point tenderness along the medial border of the left scapula     Clinical Data: No additional findings.   Subjective: Chief Complaint  Patient presents with  . Left Shoulder - Pain  . Left Knee - Pain  Patient presents today for left shoulder pain. She said that it started almost two weeks ago. No known injury. Her pain is located posteriorly. She has pain constantly, but is  worsened with lifting. She has tried tylenol and heat, but it did not help. No numbness or tingling.  She also wants to talk about left knee total arthroplasty. She has tried gel injections two different times. The injections do not help.   HPI  Review of Systems   Objective: Vital Signs: Ht 5\' 4"  (1.626 m)   Wt 162 lb (73.5 kg)   BMI 27.81 kg/m   Physical Exam Constitutional:      Appearance: She is well-developed and well-nourished.  HENT:     Mouth/Throat:     Mouth: Oropharynx is clear and moist.  Eyes:     Extraocular Movements: EOM normal.     Pupils: Pupils are equal, round, and reactive to light.  Pulmonary:     Effort: Pulmonary effort is normal.  Skin:    General: Skin is warm and dry.  Neurological:     Mental Status: She is alert and oriented to person, place, and time.  Psychiatric:        Mood and Affect: Mood and affect normal.        Behavior: Behavior normal.     Ortho Exam awake alert and oriented x3.  Comfortable sitting.  She has an area of trigger point tenderness along the vertebral border of the left scapula superiorly.  No  masses.  Negative impingement empty can testing and full range of motion of the shoulder with good strength.  No change in exam of her left knee.  No effusion.  Skin intact.  Does lack a few degrees of full extension flexed over 100 degrees without instability.  More medial joint pain than lateral today Specialty Comments:  No specialty comments available.  Imaging: XR Shoulder Left  Result Date: 02/25/2020 Films of the left shoulder obtained in several projections.  Humeral head is centered about the glenoid.  Normal space between the humeral head and the acromion.  No acute changes or ectopic calcification.  Minimal degenerative changes at the [facility] joint    PMFS History: Patient Active Problem List   Diagnosis Date Noted  . Trigger point of shoulder region, left 02/25/2020  . Elevated glucose 08/15/2019  . Anemia  02/15/2019  . Iron deficiency 02/15/2019  . Hospital discharge follow-up 01/04/2019  . Abdominal pain 12/14/2018  . Sinus pressure 11/12/2018  . History of 2019 novel coronavirus disease (COVID-19) 11/12/2018  . Need for influenza vaccination 10/05/2018  . Pleurisy 06/05/2018  . Reactive airway disease 05/07/2018  . Stage 3a chronic kidney disease ([place]) 01/15/2018  . Edema 12/15/2017  . Insomnia 11/20/2017  . Seborrheic dermatitis 11/16/2017  . Seasonal allergic rhinitis due to pollen 11/16/2017  . Viral upper respiratory tract infection 10/19/2017  . Menopausal and female climacteric states 10/19/2017  . Need for vaccination for H flu type B 10/19/2017  . Ceruminosis, right 06/12/2017  . Acute sinusitis 07/02/2015  . Constipation 03/26/2015  . Unilateral primary osteoarthritis, left knee 11/17/2014  . Screening for osteoporosis 04/25/2014  . Hypokalemia 09/15/2011  . Hypothyroidism 08/21/2008  . Mixed hyperlipidemia 08/21/2008  . Mitral valve disease 08/21/2008   Past Medical History:  Diagnosis Date  . Allergy   . Arthritis   . Cataract   . Chronic bronchitis ([place])   . Complication of anesthesia    hard to wake up  . Diverticulosis   . Environmental allergies   . GERD (gastroesophageal reflux disease)   . Hiatal hernia   . History of chicken pox   . Hyperlipidemia   . Hypothyroidism   . IBS (irritable bowel syndrome)   . Migraines   . Mitral valve prolapse   . Rectal prolapse     Family History  Problem Relation Age of Onset  . Stomach cancer Maternal Grandmother   . Hypertension Maternal Grandmother   . Diabetes Maternal Grandmother   . Stroke Maternal Grandmother   . Cancer Maternal Grandmother        Stomach cancer  . Colon cancer Paternal Grandfather   . Heart attack Paternal Grandfather   . Hyperlipidemia Mother   . Diabetes Mother   . Hypertension Mother   . Alzheimer's disease Mother   . Diabetes Father   . Hyperlipidemia Father   . Heart  disease Father   . Heart attack Father   . Stroke Paternal Grandmother     Past Surgical History:  Procedure Laterality Date  . ABDOMINAL HYSTERECTOMY  1999  . APPENDECTOMY  1962  . BREAST CYST ASPIRATION Bilateral   . CHOLECYSTECTOMY N/A 12/17/2018   Procedure: LAPAROSCOPIC CHOLECYSTECTOMY;  Surgeon: Clovis Riley, MD;  Location: [facility];  Service: General;  Laterality: N/A;  . EYE SURGERY    . JOINT REPLACEMENT     rt knee scope  . REFRACTIVE SURGERY  2000   both eyes  . TOTAL KNEE ARTHROPLASTY Right 03/24/2015  Procedure: TOTAL KNEE ARTHROPLASTY;  Surgeon: [name], MD;  Location: [facility];  Service: Orthopedics;  Laterality: Right;  . TUBAL LIGATION  1977   Social History   Occupational History  . 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 Smoking status: Former Smoker    Types: Cigarettes    Quit date: 03/23/1976    Years since quitting: 43.9  . Smokeless tobacco: Never Used  Vaping Use  . Vaping Use: Never used  Substance and Sexual Activity  . Alcohol use: No  . Drug use: No  . Sexual activity: Yes    Birth control/protection: Abstinence

## 2020-02-27 ENCOUNTER — Telehealth: Payer: Self-pay

## 2020-02-27 NOTE — Telephone Encounter (Signed)
PA for the [facility] 10/50 mcg submitted through cover my meds to [employer]. Waiting on response.  Dm/cma   Key: [id number] - PA Case ID: [id number]

## 2020-02-28 NOTE — Telephone Encounter (Signed)
PA approved form 02/28/20 -01/23/21 through [place].  Pharmacy notified [place] phone. Dm/cma

## 2020-02-28 NOTE — Telephone Encounter (Signed)
Shenique from [employer] calling to confirm a DX for pt's mediation [employer] 10/50 mcg and to verify office fax number.

## 2020-03-16 ENCOUNTER — Telehealth: Payer: Self-pay | Admitting: Family Medicine

## 2020-03-16 NOTE — Telephone Encounter (Signed)
Patient dropped off Pre-Operative Clearance form to be completed by her provider. She is aware that her provider is out of the office, but states that another provider can complete it. Informed patient that an appointment MAY be required, but we would let her know. Forms placed in provider's folder for pickup. Please call patient to let her know when forms have been faxed.

## 2020-03-17 NOTE — Telephone Encounter (Signed)
Patient needing to come in for form to be filled out, appointment scheduled.

## 2020-03-25 ENCOUNTER — Other Ambulatory Visit: Payer: Self-pay | Admitting: Family Medicine

## 2020-03-25 NOTE — Telephone Encounter (Signed)
Last OV 02/18/19 w/[facility] Last fill 06/28/19 #90/3

## 2020-03-30 ENCOUNTER — Ambulatory Visit (INDEPENDENT_AMBULATORY_CARE_PROVIDER_SITE_OTHER): Payer: PPO | Admitting: Family Medicine

## 2020-03-30 ENCOUNTER — Encounter: Payer: Self-pay | Admitting: Family Medicine

## 2020-03-30 ENCOUNTER — Other Ambulatory Visit: Payer: Self-pay

## 2020-03-30 VITALS — BP 116/68 | HR 96 | Temp 97.6°F | Ht 64.0 in | Wt 161.0 lb

## 2020-03-30 DIAGNOSIS — N1831 Chronic kidney disease, stage 3a: Secondary | ICD-10-CM | POA: Diagnosis not present

## 2020-03-30 DIAGNOSIS — M1712 Unilateral primary osteoarthritis, left knee: Secondary | ICD-10-CM

## 2020-03-30 DIAGNOSIS — J4530 Mild persistent asthma, uncomplicated: Secondary | ICD-10-CM | POA: Diagnosis not present

## 2020-03-30 DIAGNOSIS — E876 Hypokalemia: Secondary | ICD-10-CM

## 2020-03-30 DIAGNOSIS — E611 Iron deficiency: Secondary | ICD-10-CM

## 2020-03-30 DIAGNOSIS — E782 Mixed hyperlipidemia: Secondary | ICD-10-CM | POA: Diagnosis not present

## 2020-03-30 DIAGNOSIS — Z01818 Encounter for other preprocedural examination: Secondary | ICD-10-CM

## 2020-03-30 DIAGNOSIS — E039 Hypothyroidism, unspecified: Secondary | ICD-10-CM | POA: Diagnosis not present

## 2020-03-30 MED ORDER — LEVOTHYROXINE SODIUM 100 MCG PO TABS: 100.0000 ug | ORAL_TABLET | Freq: Every day | ORAL | 3 refills | Status: DC

## 2020-03-30 MED ORDER — FERROUS SULFATE 325 (65 FE) MG PO TABS: 325.0000 mg | ORAL_TABLET | ORAL | 3 refills | Status: DC

## 2020-03-30 NOTE — Progress Notes (Addendum)
[facility] [address] [place] [address] Dept: [phone] Dept Fax: [phone]  Pre-operative Office Visit  Subjective:    Patient ID: Megan Valentine, female    DOB: Jul 25, 1950, 70 y.o..   MRN: [id number]  Chief Complaint  Patient presents with  . Pre-op Exam    Pre-op clearance for total LT knee arthroscopy.      History of Present Illness:  Patient is in today for per-operative assessment prior to a planned left knee total joint replacement. Megan Valentine had her right knee joint replaced in 2017. She notes that she has needed the left knee joint replacement for quite some time. She has been bridged with intermittent Synvisc and corticosteroid injections of the knee. She did recently ended the relationship with her S.O. of about 4 years. This now raises the possibility that she will not go through with the knee surgery.   In review of her medical record, she denies any past heart issues, specifically any mitral valve disease (as was noted on her chart). She states that she has had more recent cardiology assessment, including an ECHO, without any sign of mitral valve issues. Megan Valentine has had some asthma symptoms since she had COVID. She was started on a daily Flovent inhaler and has found that this significantly decreased her use of albuterol. She has not needed the albuterol in quite some time.  Megan Valentine has a history of hypothyroidism and is on Synthroid. she also has a history of hyperlipidemia, which may have been related to her thyroid disease.  Megan Valentine has a history of stage 3a chronic kidney disease. She notes the nephrologist felt this was likely due to her heavy use of NSAIDs for her knee issues. Since she stopped using these medications, she apparently has had an improvement in this.  Megan Valentine has a past history of iron deficiency anemia. She is currently on a low dose of iron supplementation.  Past Medical  History: Patient Active Problem List   Diagnosis Date Noted  . Trigger point of shoulder region, left 02/25/2020  . Elevated glucose 08/15/2019  . Anemia 02/15/2019  . Iron deficiency 02/15/2019  . Abdominal pain 12/14/2018  . Sinus pressure 11/12/2018  . History of 2019 novel coronavirus disease (COVID-19) 11/12/2018  . Pleurisy 06/05/2018  . Enterocele 06/05/2018  . Rectocele 05/23/2018  . Vaginal vault prolapse 05/23/2018  . SUI (stress urinary incontinence, female) 05/23/2018  . Asthma 05/07/2018  . Stage 3a chronic kidney disease ([place]) 01/15/2018  . Edema 12/15/2017  . Insomnia 11/20/2017  . Seborrheic dermatitis 11/16/2017  . Seasonal allergic rhinitis due to pollen 11/16/2017  . Menopausal and female climacteric states 10/19/2017  . Constipation 03/26/2015  . Unilateral primary osteoarthritis, left knee 11/17/2014  . Hypokalemia 09/15/2011  . Hypothyroidism 08/21/2008  . Mixed hyperlipidemia 08/21/2008   Past Surgical History:  Procedure Laterality Date  . ABDOMINAL HYSTERECTOMY  1999  . APPENDECTOMY  1962  . BREAST CYST ASPIRATION Bilateral   . CHOLECYSTECTOMY N/A 12/17/2018   Procedure: LAPAROSCOPIC CHOLECYSTECTOMY;  Surgeon: Clovis Riley, MD;  Location: [place];  Service: General;  Laterality: N/A;  . EYE SURGERY    . JOINT REPLACEMENT     rt knee scope  . REFRACTIVE SURGERY  2000   both eyes  . TOTAL KNEE ARTHROPLASTY Right 03/24/2015   Procedure: TOTAL KNEE ARTHROPLASTY;  Surgeon: [name], MD;  Location: [place];  Service: Orthopedics;  Laterality: Right;  . TUBAL  LIGATION  1977   Family History  Problem Relation Age of Onset  . Stomach cancer Maternal Grandmother   . Hypertension Maternal Grandmother   . Diabetes Maternal Grandmother   . Stroke Maternal Grandmother   . Cancer Maternal Grandmother        Stomach cancer  . Colon cancer Paternal Grandfather   . Heart attack Paternal Grandfather   . Hyperlipidemia Mother   . Diabetes Mother    . Hypertension Mother   . Alzheimer's disease Mother   . Diabetes Father   . Hyperlipidemia Father   . Heart disease Father   . Heart attack Father   . Stroke Paternal Grandmother    Outpatient Medications Prior to Visit  Medication Sig Dispense Refill  . acetaminophen (TYLENOL) 500 MG tablet Take 1,000 mg by mouth every 6 (six) hours as needed for mild pain or headache.     . albuterol (VENTOLIN HFA) 108 (90 Base) MCG/ACT inhaler INHALE 2 PUFFS INTO THE LUNGS EVERY 6 HOURS AS NEEDED FOR WHEEZING OR SHORTNESS OF BREATH 8.5 g 2  . aspirin 81 MG chewable tablet Chew 81 mg daily by mouth.    . Biotin 1 MG CAPS Take 1 mg by mouth daily.    . Brimonidine Tartrate (LUMIFY) 0.025 % SOLN Apply 1 drop to eye 2 (two) times daily. Both eyes    . estradiol (ESTRACE) 0.5 MG tablet TAKE 1 TABLET(0.5 MG) BY MOUTH DAILY 90 tablet 2  . fluticasone (FLONASE) 50 MCG/ACT nasal spray Place 1 spray into both nostrils daily as needed for allergies or rhinitis.    . fluticasone (FLOVENT HFA) 44 MCG/ACT inhaler Inhale 2 puffs into the lungs in the morning and at bedtime. 1 each 12  . furosemide (LASIX) 20 MG tablet Take 1 tablet (20 mg total) by mouth daily. 30 tablet 3  . hydroxypropyl methylcellulose / hypromellose (ISOPTO TEARS / GONIOVISC) 2.5 % ophthalmic solution Place 1 drop into both eyes 3 (three) times daily as needed for dry eyes.    . pantoprazole (PROTONIX) 20 MG tablet TAKE 1 TABLET(20 MG) BY MOUTH DAILY 90 tablet 1  . Senna-Fennel (NATURAL VEGETABLE LAXATIVE) TABS Take 8 tablets by mouth at bedtime.    . sodium chloride (OCEAN) 0.65 % SOLN nasal spray Place 1 spray into both nostrils as needed for congestion.    Megan Valentine amoxicillin-clavulanate (AUGMENTIN) 875-125 MG tablet Take 1 tablet by mouth 2 (two) times daily. 14 tablet 0  . Cranberry 1000 MG CAPS Take 1,000 mg by mouth 1 day or 1 dose.    . ferrous sulfate 325 (65 FE) MG tablet Take 1 tablet (325 mg total) by mouth daily with breakfast. (Patient  taking differently: Take 175 mg by mouth 3 (three) times a week. Mondays, Wednesdays, and Fridays) 30 tablet 3  . levothyroxine (SYNTHROID) 112 MCG tablet Take 1 tablet (112 mcg total) by mouth daily. 90 tablet 1  . Cranberry-Vitamin C-Vitamin E (CRANBERRY PLUS VITAMIN C) 4200-20-3 MG-MG-UNIT CAPS Take 2 capsules by mouth daily. (Patient not taking: Reported on 03/30/2020)    . Fluticasone-Salmeterol (ADVAIR) 100-50 MCG/DOSE AEPB Inhale 1 puff into the lungs 2 (two) times daily. (Patient not taking: Reported on 03/30/2020) 1 each 3   No facility-administered medications prior to visit.   Allergies  Allergen Reactions  . Tetracycline Hives and Itching  . Dilaudid [Hydromorphone Hcl] Nausea And Vomiting  . Other     Cigarette smoke and perfumes      Objective:   Today's Vitals  03/30/20 1500  BP: 116/68  Pulse: 96  Temp: 97.6 F (36.4 C)  TempSrc: Temporal  SpO2: 97%  Weight: 161 lb (73 kg)  Height: 5\' 4"  (1.626 m)   Body mass index is 27.64 kg/m.   General: Well developed, well nourished. No acute distress. HEENT: Normocephalic, non-traumatic. PERRL, EOMI. Conjunctiva clear. External ears normal. EAC and TMs normal bilaterally.   Nose  clear without congestion or rhinorrhea. Mucous membranes moist. Oropharynx clear. Good dentition. Neck: Supple. No lymphadenopathy. No thyromegaly. Lungs: Clear to auscultation bilaterally. CV: RRR without murmurs or rubs. Pulses 2+ bilaterally. Extremities: Both knees are enlarged. The right knee has a well healed surgical scar. There are some moderate spider varicosisites   of the lower legs. No significant edema at present. Skin: Warm and dry. No rashes. Psych: Alert and oriented. Normal mood and affect.  There are no preventive care reminders to display for this patient.    Assessment & Plan:   1. Preoperative physical examination We will complete lab evaluations to provide pre-operative clearance for her left TKJ replacement, should she  choose to move ahead with it. Reviewed prior echocardiogram, which confirmed the absence of mitral valve disease. This issue was removed from her problem list. There are no indications for a CXR or EKG at present.  2. Unilateral primary osteoarthritis, left knee Basis for surgical need. Follow-up with orthopedist.  3. Acquired hypothyroidism We will assess current adequacy of her thyroid replacement. - TSH  4. Iron deficiency Inlight of prior anemia, we will assess whether this is now resolved and check iron levels to assure this has been corrected.  - CBC - Iron - ferrous sulfate 325 (65 FE) MG tablet; Take 1 tablet (325 mg total) by mouth 3 (three) times a week. Mondays, Wednesdays, and Fridays  Dispense: 12 tablet; Refill: 3  5. Mixed hyperlipidemia I will check her lipids, esp. as she is not on any lipid-lowering therapy.  - Lipid panel  6. Stage 3a chronic kidney disease ([place]) we will assess current renal funciton and double check salt levels.  - Basic metabolic panel  7. Mild persistent asthma without complication Asthma is in good control on daily Flovent with PRN albuterol use.   Haydee Salter, MD   Addendum: Lab Results  Component Value Date   WBC 8.7 03/30/2020   HGB 14.5 03/30/2020   HCT 42.7 03/30/2020   MCV 94.8 03/30/2020   PLT 356.0 03/30/2020   Lab Results  Component Value Date   IRON 95 03/30/2020   TIBC CANCELED 08/15/2019   FERRITIN 63 08/15/2019   Lab Results  Component Value Date   CHOL 272 (H) 03/30/2020   HDL 74.20 03/30/2020   LDLCALC 133 (H) 02/18/2020   LDLDIRECT 166.0 03/30/2020   TRIG 207.0 (H) 03/30/2020   CHOLHDL 4 03/30/2020   Lab Results  Component Value Date   NA 142 03/30/2020   K 3.2 (L) 03/30/2020   CO2 28 03/30/2020   GLUCOSE 109 (H) 03/30/2020   BUN 18 03/30/2020   CREATININE 0.98 03/30/2020   CALCIUM 9.6 03/30/2020   GFRNONAA 59 (L) 12/17/2018   GFRAA >60 12/17/2018   Lab Results  Component Value Date   TSH  4.12 03/30/2020   No anemia at present and iron levels are normal. I feel patient can safely stop her iron supplementation. I recommend reassessing her CBC in 3-6 months.  Lipids are elevated. ACC/AHA 10-Year Risk of ASCVD is 8.19% (intermediate risk). She also has mild CKD. She  has had past intolerance of multiple statins. I will start her on fenofibrate and plan to reassess her lipids in 3 months.  Potassium level is recurrently mildly low. She has some potassium at home (20 meq tabs). I recommended she take 1/2 tab (10 meq) daily. We can reassess in 3 months.  TSH is normal. We will continue her current levothyroxine dose (100 mcg daily).  I called and discussed these issues with Ms. Edler. She has a follow-up with Dr. Ethelene Hal scheduled for 3 months.

## 2020-03-31 ENCOUNTER — Telehealth: Payer: Self-pay

## 2020-03-31 LAB — CBC
HCT: 42.7 % (ref 36.0–46.0)
Hemoglobin: 14.5 g/dL (ref 12.0–15.0)
MCHC: 34 g/dL (ref 30.0–36.0)
MCV: 94.8 fl (ref 78.0–100.0)
Platelets: 356 10*3/uL (ref 150.0–400.0)
RBC: 4.51 Mil/uL (ref 3.87–5.11)
RDW: 13.2 % (ref 11.5–15.5)
WBC: 8.7 10*3/uL (ref 4.0–10.5)

## 2020-03-31 LAB — BASIC METABOLIC PANEL
BUN: 18 mg/dL (ref 6–23)
CO2: 28 mEq/L (ref 19–32)
Calcium: 9.6 mg/dL (ref 8.4–10.5)
Chloride: 102 mEq/L (ref 96–112)
Creatinine, Ser: 0.98 mg/dL (ref 0.40–1.20)
GFR: 58.67 mL/min — ABNORMAL LOW (ref >60.00)
Glucose, Bld: 109 mg/dL — ABNORMAL HIGH (ref 70–99)
Potassium: 3.2 mEq/L — ABNORMAL LOW (ref 3.5–5.1)
Sodium: 142 mEq/L (ref 135–145)

## 2020-03-31 LAB — LDL CHOLESTEROL, DIRECT: Direct LDL: 166 mg/dL

## 2020-03-31 LAB — LIPID PANEL
Cholesterol: 272 mg/dL — ABNORMAL HIGH (ref 0–200)
HDL: 74.2 mg/dL (ref >39.00)
NonHDL: 197.41
Total CHOL/HDL Ratio: 4
Triglycerides: 207 mg/dL — ABNORMAL HIGH (ref 0.0–149.0)
VLDL: 41.4 mg/dL — ABNORMAL HIGH (ref 0.0–40.0)

## 2020-03-31 LAB — IRON: Iron: 95 ug/dL (ref 42–145)

## 2020-03-31 LAB — TSH: TSH: 4.12 u[IU]/mL (ref 0.35–4.50)

## 2020-03-31 MED ORDER — POTASSIUM CHLORIDE ER 10 MEQ PO TBCR: 10.0000 meq | EXTENDED_RELEASE_TABLET | Freq: Every day | ORAL | 3 refills | Status: DC

## 2020-03-31 MED ORDER — FENOFIBRATE 48 MG PO TABS: 48.0000 mg | ORAL_TABLET | Freq: Every day | ORAL | 3 refills | Status: DC

## 2020-03-31 NOTE — Addendum Note (Signed)
Addended by: Haydee Salter on: 03/31/2020 02:25 PM   Modules accepted: Orders

## 2020-03-31 NOTE — Telephone Encounter (Signed)
Pre-op [facility] @ [phone] along with OV note and labs. Dm/cma

## 2020-04-04 ENCOUNTER — Encounter: Payer: Self-pay | Admitting: Family Medicine

## 2020-04-15 DIAGNOSIS — N398 Other specified disorders of urinary system: Secondary | ICD-10-CM | POA: Diagnosis not present

## 2020-04-27 DIAGNOSIS — Z961 Presence of intraocular lens: Secondary | ICD-10-CM | POA: Diagnosis not present

## 2020-04-27 DIAGNOSIS — H40011 Open angle with borderline findings, low risk, right eye: Secondary | ICD-10-CM | POA: Diagnosis not present

## 2020-04-27 DIAGNOSIS — H16223 Keratoconjunctivitis sicca, not specified as Sjogren's, bilateral: Secondary | ICD-10-CM | POA: Diagnosis not present

## 2020-04-27 DIAGNOSIS — H40022 Open angle with borderline findings, high risk, left eye: Secondary | ICD-10-CM | POA: Diagnosis not present

## 2020-05-06 ENCOUNTER — Telehealth: Payer: Self-pay | Admitting: Family Medicine

## 2020-05-06 ENCOUNTER — Other Ambulatory Visit: Payer: Self-pay | Admitting: Family Medicine

## 2020-05-06 NOTE — Telephone Encounter (Signed)
What is the name of the medication? Pt is wanting a script for Megan Valentine.   Have you contacted your pharmacy to request a refill? She says her allergies are kicking in and she would like to knock out her coughing.   Which pharmacy would you like this sent to? Pharmacy  [facility], [address]  [place], [place] [address]  Phone:  [phone] Fax:  [phone]  DEA #:  [id number]     Patient notified that their request is being sent to the clinical staff for review and that they should receive a call once it is complete. If they do not receive a call within 72 hours they can check with their pharmacy or our office.

## 2020-05-06 NOTE — Telephone Encounter (Signed)
Please advise message below, patient calling for Rx for prednisone per patient her allergies have started bothering her and she would like to take care if this before it becomes worse.

## 2020-05-07 ENCOUNTER — Telehealth (INDEPENDENT_AMBULATORY_CARE_PROVIDER_SITE_OTHER): Payer: PPO | Admitting: Family Medicine

## 2020-05-07 ENCOUNTER — Encounter: Payer: Self-pay | Admitting: Family Medicine

## 2020-05-07 DIAGNOSIS — R6889 Other general symptoms and signs: Secondary | ICD-10-CM | POA: Diagnosis not present

## 2020-05-07 MED ORDER — BENZONATATE 100 MG PO CAPS: 100.0000 mg | ORAL_CAPSULE | Freq: Three times a day (TID) | ORAL | 0 refills | Status: DC | PRN

## 2020-05-07 MED ORDER — OSELTAMIVIR PHOSPHATE 75 MG PO CAPS: 75.0000 mg | ORAL_CAPSULE | Freq: Two times a day (BID) | ORAL | 0 refills | Status: DC

## 2020-05-07 NOTE — Telephone Encounter (Signed)
Virtual visit today

## 2020-05-07 NOTE — Telephone Encounter (Signed)
Needs to be seen somewhere

## 2020-05-07 NOTE — Patient Instructions (Signed)
  HOME CARE TIPS:  -[place] testing information: https://www.[facility].org/ OR ([phone] Most pharmacies also offer testing and home test kits. Schedule follow up video visit with primary care office or [place] if covid test is positive to discuss treatment options/referral for treatment.   -I sent the medication(s) we discussed to your pharmacy: Meds ordered this encounter  Medications  . oseltamivir (TAMIFLU) 75 MG capsule    Sig: Take 1 capsule (75 mg total) by mouth 2 (two) times daily.    Dispense:  10 capsule    Refill:  0  . benzonatate (TESSALON PERLES) 100 MG capsule    Sig: Take 1 capsule (100 mg total) by mouth 3 (three) times daily as needed.    Dispense:  20 capsule    Refill:  0     -stop the Tamiflu if the covid test is positive  -can use imodium if you have any further diarrhea  -can use tylenol if needed for fevers, aches and pains per instructions  -can use nasal saline a few times per day if you have nasal congestion; sometimes  a short course of Afrin nasal spray for 3 days can help with symptoms as well  -stay hydrated, drink plenty of fluids and eat small healthy meals - avoid dairy  -can take 1000 IU (60mcg) Vit D3 and 100-500 mg of Vit C daily per instructions  -If the Covid test is positive, check out the CDC website for more information on home care, transmission and treatment for COVID19  -follow up with your doctor in 2-3 days unless improving and feeling better  -stay home while sick, except to seek medical care, and if you have [place] ideally it would be best to stay home for a full 10 days since the onset of symptoms PLUS one day of no fever and feeling better. Wear a good mask (such as N95 or KN95) if around others to reduce the risk of transmission.  It was nice to meet you today, and I really hope you are feeling better soon. I help [facility] out with telemedicine visits on Tuesdays and  Thursdays and am available for visits on those days. If you have any concerns or questions following this visit please schedule a follow up visit with your Primary Care doctor or seek care at a local urgent care clinic to avoid delays in care.    Seek in person care or schedule a follow up video visit promptly if your symptoms worsen, new concerns arise or you are not improving with treatment. Call 911 and/or seek emergency care if your symptoms are severe or life threatening.

## 2020-05-07 NOTE — Progress Notes (Signed)
Virtual Visit via Video Note  I connected with@  on 05/07/20 at 11:00 AM EDT by a video enabled telemedicine application and verified that I am speaking with the correct person using two identifiers.  Location patient: home, [place] Location provider:work or home office Persons participating in the virtual visit: patient, provider  I discussed the limitations of evaluation and management by telemedicine and the availability of in person appointments. The patient expressed understanding and agreed to proceed.   HPI:  Acute telemedicine visit for : -Onset: yesterday -Symptoms include: congestion, cough, vomited once, diarrhea several times, fever up to 101 -home covid test negative yesterday -Denies: CP, SOB, wheezing, hematochezia, melena inability to eat/drink/get out of bed  -Has tried: tylenol -Pertinent past medical history: asthma - reports now on flovent daily and has done much better (no SOB or wheezing with this illness so far) -Pertinent medication allergies: tetracycline, dilaudid -COVID-19 vaccine status: had 2 doses and booster; also had flu shot  ROS: See pertinent positives and negatives per HPI.  Past Medical History:  Diagnosis Date  . Allergy   . Arthritis   . Cataract   . Chronic bronchitis ([place])   . Complication of anesthesia    hard to wake up  . Diverticulosis   . Environmental allergies   . GERD (gastroesophageal reflux disease)   . Hiatal hernia   . History of chicken pox   . Hyperlipidemia   . Hypothyroidism   . IBS (irritable bowel syndrome)   . Migraines   . Mitral valve prolapse   . Rectal prolapse     Past Surgical History:  Procedure Laterality Date  . ABDOMINAL HYSTERECTOMY  1999  . APPENDECTOMY  1962  . BREAST CYST ASPIRATION Bilateral   . CHOLECYSTECTOMY N/A 12/17/2018   Procedure: LAPAROSCOPIC CHOLECYSTECTOMY;  Surgeon: Clovis Riley, MD;  Location: [facility];  Service: General;  Laterality: N/A;  . EYE SURGERY    . JOINT REPLACEMENT      rt knee scope  . REFRACTIVE SURGERY  2000   both eyes  . TOTAL KNEE ARTHROPLASTY Right 03/24/2015   Procedure: TOTAL KNEE ARTHROPLASTY;  Surgeon: [name], MD;  Location: [place];  Service: Orthopedics;  Laterality: Right;  . TUBAL LIGATION  1977     Current Outpatient Medications:  .  benzonatate (TESSALON PERLES) 100 MG capsule, Take 1 capsule (100 mg total) by mouth 3 (three) times daily as needed., Disp: 20 capsule, Rfl: 0 .  oseltamivir (TAMIFLU) 75 MG capsule, Take 1 capsule (75 mg total) by mouth 2 (two) times daily., Disp: 10 capsule, Rfl: 0 .  acetaminophen (TYLENOL) 500 MG tablet, Take 1,000 mg by mouth every 6 (six) hours as needed for mild pain or headache. , Disp: , Rfl:  .  albuterol (VENTOLIN HFA) 108 (90 Base) MCG/ACT inhaler, INHALE 2 PUFFS INTO THE LUNGS EVERY 6 HOURS AS NEEDED FOR WHEEZING OR SHORTNESS OF BREATH, Disp: 8.5 g, Rfl: 2 .  aspirin 81 MG chewable tablet, Chew 81 mg daily by mouth., Disp: , Rfl:  .  Biotin 1 MG CAPS, Take 1 mg by mouth daily., Disp: , Rfl:  .  Brimonidine Tartrate (LUMIFY) 0.025 % SOLN, Apply 1 drop to eye 2 (two) times daily. Both eyes, Disp: , Rfl:  .  estradiol (ESTRACE) 0.5 MG tablet, TAKE 1 TABLET(0.5 MG) BY MOUTH DAILY, Disp: 90 tablet, Rfl: 2 .  fenofibrate (TRICOR) 48 MG tablet, Take 1 tablet (48 mg total) by mouth daily., Disp: 90 tablet,  Rfl: 3 .  fluticasone (FLONASE) 50 MCG/ACT nasal spray, Place 1 spray into both nostrils daily as needed for allergies or rhinitis., Disp: , Rfl:  .  fluticasone (FLOVENT HFA) 44 MCG/ACT inhaler, Inhale 2 puffs into the lungs in the morning and at bedtime., Disp: 1 each, Rfl: 12 .  furosemide (LASIX) 20 MG tablet, Take 1 tablet (20 mg total) by mouth daily., Disp: 30 tablet, Rfl: 3 .  hydroxypropyl methylcellulose / hypromellose (ISOPTO TEARS / GONIOVISC) 2.5 % ophthalmic solution, Place 1 drop into both eyes 3 (three) times daily as needed for dry eyes., Disp: , Rfl:  .  levothyroxine  (SYNTHROID) 100 MCG tablet, Take 1 tablet (100 mcg total) by mouth daily., Disp: 90 tablet, Rfl: 3 .  pantoprazole (PROTONIX) 20 MG tablet, TAKE 1 TABLET(20 MG) BY MOUTH DAILY, Disp: 90 tablet, Rfl: 1 .  potassium chloride (KLOR-CON) 10 MEQ tablet, Take 1 tablet (10 mEq total) by mouth daily., Disp: 90 tablet, Rfl: 3 .  Senna-Fennel (NATURAL VEGETABLE LAXATIVE) TABS, Take 8 tablets by mouth at bedtime., Disp: , Rfl:  .  sodium chloride (OCEAN) 0.65 % SOLN nasal spray, Place 1 spray into both nostrils as needed for congestion., Disp: , Rfl:   EXAM:  VITALS per patient if applicable:  GENERAL: alert, oriented, appears well and in no acute distress  HEENT: atraumatic, conjunttiva clear, no obvious abnormalities on inspection of external nose and ears  NECK: normal movements of the head and neck  LUNGS: on inspection no signs of respiratory distress, breathing rate appears normal, no obvious gross SOB, gasping or wheezing  CV: no obvious cyanosis  MS: moves all visible extremities without noticeable abnormality  PSYCH/NEURO: pleasant and cooperative, no obvious depression or anxiety, speech and thought processing grossly intact  ASSESSMENT AND PLAN:  Discussed the following assessment and plan:  No diagnosis found.  -we discussed possible serious and likely etiologies, options for evaluation and workup, limitations of telemedicine visit vs in person visit, treatment, treatment risks and precautions. Pt prefers to treat via telemedicine empirically rather than in person at this moment. Query influenza, covid with false positive initial test, viral illness vs other. Discussed testing options, treatment, potential complications and precautions. She opted for oral fluids, imodium if any frther diarrhea, nasal saline, continuation of flovent, prn alb, tessalon for cough, initiation of tamiflu and repeat covid testing at home tomorrow. Agrees to stop tamiflu and do follow up video visit if gets  positive covid test for referral for treatment.  Work/School slipped offered: declined Scheduled follow up with PCP offered:agrees to follow up as needed. Advised to seek prompt in person care if worsening, new symptoms arise, or if is not improving with treatment. Discussed options for inperson care if PCP office not available. Did let this patient know that I only do telemedicine on Tuesdays and Thursdays for [place]. Advised to schedule follow up visit with PCP or [facility] if any further questions or concerns to avoid delays in care.   I discussed the assessment and treatment plan with the patient. The patient was provided an opportunity to ask questions and all were answered. The patient agreed with the plan and demonstrated an understanding of the instructions.     Lucretia Kern, DO

## 2020-05-08 ENCOUNTER — Encounter (HOSPITAL_COMMUNITY): Payer: Self-pay | Admitting: Emergency Medicine

## 2020-05-08 ENCOUNTER — Ambulatory Visit (HOSPITAL_COMMUNITY)
Admission: EM | Admit: 2020-05-08 | Discharge: 2020-05-08 | Disposition: A | Discharge status: Home / Self Care | Payer: PPO | Attending: Emergency Medicine | Admitting: Emergency Medicine

## 2020-05-08 ENCOUNTER — Ambulatory Visit (INDEPENDENT_AMBULATORY_CARE_PROVIDER_SITE_OTHER): Payer: PPO

## 2020-05-08 ENCOUNTER — Other Ambulatory Visit: Payer: Self-pay

## 2020-05-08 ENCOUNTER — Encounter: Payer: Self-pay | Admitting: Family Medicine

## 2020-05-08 DIAGNOSIS — R0602 Shortness of breath: Secondary | ICD-10-CM | POA: Diagnosis not present

## 2020-05-08 DIAGNOSIS — J069 Acute upper respiratory infection, unspecified: Secondary | ICD-10-CM

## 2020-05-08 DIAGNOSIS — R059 Cough, unspecified: Secondary | ICD-10-CM

## 2020-05-08 DIAGNOSIS — Z87891 Personal history of nicotine dependence: Secondary | ICD-10-CM

## 2020-05-08 MED ORDER — PREDNISONE 20 MG PO TABS: 20.0000 mg | ORAL_TABLET | Freq: Every day | ORAL | 0 refills | Status: DC

## 2020-05-08 MED ORDER — BENZONATATE 200 MG PO CAPS: 200.0000 mg | ORAL_CAPSULE | Freq: Three times a day (TID) | ORAL | 0 refills | Status: DC | PRN

## 2020-05-08 NOTE — ED Provider Notes (Signed)
Megan Valentine    CSN: [id number] Arrival date & time: 05/08/20  0932      History   Chief Complaint Chief Complaint  Patient presents with  . Cough  . Fever  . Nasal Congestion    HPI Megan Valentine is a 70 y.o. female.   Patient here for evaluation of cough, nasal congestion, and fever.  Reports having a telehealth visit yesterday and was prescribed Tessalon Perles.  Reports Tessalon Perles have not been beneficial.  Also reports taking codeine cough syrup with minimal relief.  Patient does report history of asthma and was outside doing yard work earlier in the week and then visiting with a friend who has a shedding dog.  Patient is concerned about possible asthma exacerbation versus viral illness.  Reports taking all prescribed medications as prescribed.   Denies chest pain, shortness of breath, N/V/D, numbness, tingling, weakness, abdominal pain, or headaches.   ROS: As per HPI, all other pertinent ROS negative    The history is provided by the patient.    Past Medical History:  Diagnosis Date  . Allergy   . Arthritis   . Cataract   . Chronic bronchitis ([place])   . Complication of anesthesia    hard to wake up  . Diverticulosis   . Environmental allergies   . GERD (gastroesophageal reflux disease)   . Hiatal hernia   . History of chicken pox   . Hyperlipidemia   . Hypothyroidism   . IBS (irritable bowel syndrome)   . Migraines   . Mitral valve prolapse   . Rectal prolapse     Patient Active Problem List   Diagnosis Date Noted  . Trigger point of shoulder region, left 02/25/2020  . Elevated glucose 08/15/2019  . Anemia 02/15/2019  . Iron deficiency 02/15/2019  . Abdominal pain 12/14/2018  . Sinus pressure 11/12/2018  . History of 2019 novel coronavirus disease (COVID-19) 11/12/2018  . Pleurisy 06/05/2018  . Enterocele 06/05/2018  . Rectocele 05/23/2018  . Vaginal vault prolapse 05/23/2018  . SUI (stress urinary incontinence, female) 05/23/2018   . Asthma 05/07/2018  . Stage 3a chronic kidney disease ([place]) 01/15/2018  . Edema 12/15/2017  . Insomnia 11/20/2017  . Seborrheic dermatitis 11/16/2017  . Seasonal allergic rhinitis due to pollen 11/16/2017  . Menopausal and female climacteric states 10/19/2017  . Constipation 03/26/2015  . Unilateral primary osteoarthritis, left knee 11/17/2014  . Hypokalemia 09/15/2011  . Hypothyroidism 08/21/2008  . Mixed hyperlipidemia 08/21/2008    Past Surgical History:  Procedure Laterality Date  . ABDOMINAL HYSTERECTOMY  1999  . APPENDECTOMY  1962  . BREAST CYST ASPIRATION Bilateral   . CHOLECYSTECTOMY N/A 12/17/2018   Procedure: LAPAROSCOPIC CHOLECYSTECTOMY;  Surgeon: Clovis Riley, MD;  Location: [facility];  Service: General;  Laterality: N/A;  . EYE SURGERY    . JOINT REPLACEMENT     rt knee scope  . REFRACTIVE SURGERY  2000   both eyes  . TOTAL KNEE ARTHROPLASTY Right 03/24/2015   Procedure: TOTAL KNEE ARTHROPLASTY;  Surgeon: [name], MD;  Location: [facility];  Service: Orthopedics;  Laterality: Right;  . TUBAL LIGATION  1977    OB History   No obstetric history on file.      Home Medications    Prior to Admission medications   Medication Sig Start Date End Date Taking? Authorizing Provider  benzonatate (TESSALON) 200 MG capsule Take 1 capsule (200 mg total) by mouth 3 (three) times daily as needed for  cough. 05/08/20  Yes Pearson Forster, NP  predniSONE (DELTASONE) 20 MG tablet Take 1 tablet (20 mg total) by mouth daily. 05/08/20  Yes Pearson Forster, NP  acetaminophen (TYLENOL) 500 MG tablet Take 1,000 mg by mouth every 6 (six) hours as needed for mild pain or headache.     [provider]  albuterol (VENTOLIN HFA) 108 (90 Base) MCG/ACT inhaler INHALE 2 PUFFS INTO THE LUNGS EVERY 6 HOURS AS NEEDED FOR WHEEZING OR SHORTNESS OF BREATH 11/09/18   Libby Maw, MD  aspirin 81 MG chewable tablet Chew 81 mg daily by mouth.    [provider]   Biotin 1 MG CAPS Take 1 mg by mouth daily.    [provider]  Brimonidine Tartrate (LUMIFY) 0.025 % SOLN Apply 1 drop to eye 2 (two) times daily. Both eyes    [provider]  estradiol (ESTRACE) 0.5 MG tablet TAKE 1 TABLET(0.5 MG) BY MOUTH DAILY 03/25/20   Libby Maw, MD  fenofibrate (TRICOR) 48 MG tablet Take 1 tablet (48 mg total) by mouth daily. 03/31/20   Haydee Salter, MD  fluticasone (FLONASE) 50 MCG/ACT nasal spray Place 1 spray into both nostrils daily as needed for allergies or rhinitis.    [provider]  fluticasone (FLOVENT HFA) 44 MCG/ACT inhaler Inhale 2 puffs into the lungs in the morning and at bedtime. 11/28/19   Lucretia Kern, DO  furosemide (LASIX) 20 MG tablet Take 1 tablet (20 mg total) by mouth daily. 02/18/20   Kennyth Arnold, FNP  hydroxypropyl methylcellulose / hypromellose (ISOPTO TEARS / GONIOVISC) 2.5 % ophthalmic solution Place 1 drop into both eyes 3 (three) times daily as needed for dry eyes.    [provider]  levothyroxine (SYNTHROID) 100 MCG tablet Take 1 tablet (100 mcg total) by mouth daily. 03/30/20   Haydee Salter, MD  oseltamivir (TAMIFLU) 75 MG capsule Take 1 capsule (75 mg total) by mouth 2 (two) times daily. 05/07/20   Lucretia Kern, DO  pantoprazole (PROTONIX) 20 MG tablet TAKE 1 TABLET(20 MG) BY MOUTH DAILY 12/26/19   Libby Maw, MD  potassium chloride (KLOR-CON) 10 MEQ tablet Take 1 tablet (10 mEq total) by mouth daily. 03/31/20   Haydee Salter, MD  Senna-Fennel (NATURAL VEGETABLE LAXATIVE) TABS Take 8 tablets by mouth at bedtime.    [provider]  sodium chloride (OCEAN) 0.65 % SOLN nasal spray Place 1 spray into both nostrils as needed for congestion.    [provider]    Family History Family History  Problem Relation Age of Onset  . Stomach cancer Maternal Grandmother   . Hypertension Maternal Grandmother   . Diabetes Maternal Grandmother   . Stroke Maternal  Grandmother   . Cancer Maternal Grandmother        Stomach cancer  . Colon cancer Paternal Grandfather   . Heart attack Paternal Grandfather   . Hyperlipidemia Mother   . Diabetes Mother   . Hypertension Mother   . Alzheimer's disease Mother   . Diabetes Father   . Hyperlipidemia Father   . Heart disease Father   . Heart attack Father   . Stroke Paternal Grandmother     Social History Social History   Tobacco Use  . Smoking status: Former Smoker    Types: Cigarettes    Quit date: 03/23/1976    Years since quitting: 44.1  . Smokeless tobacco: Never Used  Vaping Use  . Vaping Use:  Never used  Substance Use Topics  . Alcohol use: No  . Drug use: No     Allergies   Tetracycline, Dilaudid [hydromorphone hcl], and Other   Review of Systems Review of Systems  Constitutional: Positive for fever.  HENT: Positive for rhinorrhea.   Respiratory: Positive for cough.   All other systems reviewed and are negative.    Physical Exam Triage Vital Signs ED Triage Vitals  Enc Vitals Group     BP 05/08/20 1113 100/65     Pulse Rate 05/08/20 1113 77     Resp 05/08/20 1113 18     Temp 05/08/20 1113 98.4 F (36.9 C)     Temp Source 05/08/20 1113 Oral     SpO2 05/08/20 1113 99 %     Weight --      Height --      Head Circumference --      Peak Flow --      Pain Score 05/08/20 1111 4     Pain Loc --      Pain Edu? --      Excl. in [place]? --    No data found.  Updated Vital Signs BP 100/65 (BP Location: Left Arm)   Pulse 77   Temp 98.4 F (36.9 C) (Oral)   Resp 18   SpO2 99%   Visual Acuity Right Eye Distance:   Left Eye Distance:   Bilateral Distance:    Right Eye Near:   Left Eye Near:    Bilateral Near:     Physical Exam Vitals and nursing note reviewed.  Constitutional:      General: She is not in acute distress.    Appearance: Normal appearance. She is not ill-appearing, toxic-appearing or diaphoretic.  HENT:     Head: Normocephalic and atraumatic.      Nose: Congestion present.  Eyes:     Conjunctiva/sclera: Conjunctivae normal.  Cardiovascular:     Rate and Rhythm: Normal rate.     Pulses: Normal pulses.  Pulmonary:     Effort: Pulmonary effort is normal. No respiratory distress.     Breath sounds: Normal breath sounds. No stridor. No wheezing, rhonchi or rales.     Comments: Mild cough Chest:     Chest wall: No tenderness.  Abdominal:     General: Abdomen is flat.  Musculoskeletal:        General: Normal range of motion.     Cervical back: Normal range of motion.  Skin:    General: Skin is warm and dry.  Neurological:     General: No focal deficit present.     Mental Status: She is alert and oriented to person, place, and time.  Psychiatric:        Mood and Affect: Mood normal.      UC Treatments / Results  Labs (all labs ordered are listed, but only abnormal results are displayed) Labs Reviewed - No data to display  EKG   Radiology DG Chest 2 View  Result Date: 05/08/2020 CLINICAL DATA:  70 year old female with shortness of breath and cough for 2 days. Former smoker. EXAM: CHEST - 2 VIEW COMPARISON:  04/30/2018 chest radiographs and earlier. FINDINGS: Lung volumes and mediastinal contours are stable since 2018, within normal limits. Visualized tracheal air column is within normal limits. Chronic increased lung markings also appear stable, and both lungs otherwise appear clear. No pneumothorax or pleural effusion. No acute osseous abnormality identified. Negative visible bowel gas pattern. IMPRESSION: No acute cardiopulmonary  abnormality. Electronically Signed   By: Genevie Ann M.D.   On: 05/08/2020 12:02    Procedures Procedures (including critical care time)  Medications Ordered in UC Medications - No data to display  Initial Impression / Assessment and Plan / UC Course  I have reviewed the triage vital signs and the nursing notes.  Pertinent labs & imaging results that were available during my care of the  patient were reviewed by me and considered in my medical decision making (see chart for details).     Assessment negative for red flags or concerns.  X-ray with no acute cardiopulmonary abnormality.  Patient given increased dose of Tessalon Perles for possible viral infection with cough.  Prednisone 20 mg daily for the next 5 days in case of possible asthma exacerbation.  Continue taking daily medications as prescribed. Encourage fluids and rest.  Follow-up with primary care.   Final Clinical Impressions(s) / UC Diagnoses   Final diagnoses:  Viral URI with cough     Discharge Instructions     Take the new dose of Tessalon Perles 3 times a day as needed for cough.  Take 1 prednisone a day for the next 5 days.  Drink plenty of fluids and rest.  Follow-up with primary care or go to the Emergency Department if symptoms worsen or do not improve in the next few days.      ED Prescriptions    Medication Sig Dispense Auth. Provider   benzonatate (TESSALON) 200 MG capsule Take 1 capsule (200 mg total) by mouth 3 (three) times daily as needed for cough. 20 capsule Pearson Forster, NP   predniSONE (DELTASONE) 20 MG tablet Take 1 tablet (20 mg total) by mouth daily. 5 tablet Pearson Forster, NP     PDMP not reviewed this encounter.   Pearson Forster, NP 05/08/20 1215

## 2020-05-08 NOTE — ED Triage Notes (Signed)
Pt is present today with a cough, fever, and nasal congestion. Pt states the sx started Wednesday. Pt was prescribe tessalon pearls during her virtual visit and started taking them yesterday.

## 2020-05-08 NOTE — Discharge Instructions (Signed)
Take the new dose of Tessalon Perles 3 times a day as needed for cough.  Take 1 prednisone a day for the next 5 days.  Drink plenty of fluids and rest.  Follow-up with primary care or go to the Emergency Department if symptoms worsen or do not improve in the next few days.

## 2020-05-15 ENCOUNTER — Other Ambulatory Visit: Payer: Self-pay

## 2020-05-15 DIAGNOSIS — R6889 Other general symptoms and signs: Secondary | ICD-10-CM

## 2020-05-15 MED ORDER — PREDNISONE 10 MG PO TABS: 10.0000 mg | ORAL_TABLET | Freq: Two times a day (BID) | ORAL | 0 refills | Status: AC

## 2020-06-01 ENCOUNTER — Telehealth: Payer: Self-pay | Admitting: Family Medicine

## 2020-06-01 NOTE — Telephone Encounter (Signed)
OV is fine per Dr. Ethelene Hal

## 2020-06-01 NOTE — Telephone Encounter (Signed)
I scheduled pt an appointment for this issue.

## 2020-06-01 NOTE — Telephone Encounter (Signed)
Pt is having symptoms of her chest hurting, a cough and R ear pain. I tried to make her a Mychart video appointment. She is insisting that this appointment is suppose to be in person. She said Dr. Ethelene Hal told her this. Should this visit be Mychart video or an OV?

## 2020-06-02 ENCOUNTER — Other Ambulatory Visit: Payer: Self-pay

## 2020-06-03 ENCOUNTER — Encounter: Payer: Self-pay | Admitting: Family Medicine

## 2020-06-03 ENCOUNTER — Ambulatory Visit (INDEPENDENT_AMBULATORY_CARE_PROVIDER_SITE_OTHER): Payer: PPO | Admitting: Family Medicine

## 2020-06-03 VITALS — BP 118/72 | HR 70 | Temp 97.4°F | Ht 64.0 in | Wt 156.2 lb

## 2020-06-03 DIAGNOSIS — J4531 Mild persistent asthma with (acute) exacerbation: Secondary | ICD-10-CM

## 2020-06-03 DIAGNOSIS — J301 Allergic rhinitis due to pollen: Secondary | ICD-10-CM

## 2020-06-03 NOTE — Progress Notes (Signed)
[facility] [address] [place] [address] Dept: [phone] Dept Fax: [phone]  Office Visit  Subjective:    Patient ID: Megan Valentine, female    DOB: 08-12-1950, 70 y.o..   MRN: [id number]  Chief Complaint  Patient presents with  . Acute Visit    C/o having cough & chest tightness x 2 months and RT ear pain x 1 week. She has taken several doses of prednisone with little relief.       History of Present Illness:  Patient is in today for reassessment of her recent respiratory illness. She was seen for a video visit on 05/07/2020 with an acute exacerbation of respiratory symptoms. She was treated with Tamiflu and Tessalon. The next day, her symptoms were worsening, so she went to Urgent Care. Her CXR was clear. She was placed on a 5-day course of prednisone and a higher dose of Tessalon. Her prednisone course was extended once since then and she completed it yesterday. She is still having some occasional cough and persistent chest mucous. She also is noting pressure in her right ear. She has a prescription for Flonase, but is not currently using this. She is using her Flovent inhaler and albuterol if needed.  Past Medical History: Patient Active Problem List   Diagnosis Date Noted  . Trigger point of shoulder region, left 02/25/2020  . Elevated glucose 08/15/2019  . Anemia 02/15/2019  . Abdominal pain 12/14/2018  . Sinus pressure 11/12/2018  . History of 2019 novel coronavirus disease (COVID-19) 11/12/2018  . Pleurisy 06/05/2018  . Enterocele 06/05/2018  . Rectocele 05/23/2018  . Vaginal vault prolapse 05/23/2018  . SUI (stress urinary incontinence, female) 05/23/2018  . Asthma 05/07/2018  . Stage 3a chronic kidney disease ([place]) 01/15/2018  . Edema 12/15/2017  . Insomnia 11/20/2017  . Seborrheic dermatitis 11/16/2017  . Seasonal allergic rhinitis due to pollen 11/16/2017  . Menopausal and female climacteric states  10/19/2017  . Constipation 03/26/2015  . Unilateral primary osteoarthritis, left knee 11/17/2014  . Hypokalemia 09/15/2011  . Hypothyroidism 08/21/2008  . Mixed hyperlipidemia 08/21/2008   Past Surgical History:  Procedure Laterality Date  . ABDOMINAL HYSTERECTOMY  1999  . APPENDECTOMY  1962  . BREAST CYST ASPIRATION Bilateral   . CHOLECYSTECTOMY N/A 12/17/2018   Procedure: LAPAROSCOPIC CHOLECYSTECTOMY;  Surgeon: Clovis Riley, MD;  Location: [facility];  Service: General;  Laterality: N/A;  . EYE SURGERY    . JOINT REPLACEMENT     rt knee scope  . REFRACTIVE SURGERY  2000   both eyes  . TOTAL KNEE ARTHROPLASTY Right 03/24/2015   Procedure: TOTAL KNEE ARTHROPLASTY;  Surgeon: [name], MD;  Location: [facility];  Service: Orthopedics;  Laterality: Right;  . TUBAL LIGATION  1977   Family History  Problem Relation Age of Onset  . Stomach cancer Maternal Grandmother   . Hypertension Maternal Grandmother   . Diabetes Maternal Grandmother   . Stroke Maternal Grandmother   . Cancer Maternal Grandmother        Stomach cancer  . Colon cancer Paternal Grandfather   . Heart attack Paternal Grandfather   . Hyperlipidemia Mother   . Diabetes Mother   . Hypertension Mother   . Alzheimer's disease Mother   . Diabetes Father   . Hyperlipidemia Father   . Heart disease Father   . Heart attack Father   . Stroke Paternal Grandmother    Outpatient Medications Prior to Visit  Medication Sig Dispense  Refill  . acetaminophen (TYLENOL) 500 MG tablet Take 1,000 mg by mouth every 6 (six) hours as needed for mild pain or headache.     . albuterol (VENTOLIN HFA) 108 (90 Base) MCG/ACT inhaler INHALE 2 PUFFS INTO THE LUNGS EVERY 6 HOURS AS NEEDED FOR WHEEZING OR SHORTNESS OF BREATH 8.5 g 2  . aspirin 81 MG chewable tablet Chew 81 mg daily by mouth.    . benzonatate (TESSALON) 200 MG capsule Take 1 capsule (200 mg total) by mouth 3 (three) times daily as needed for cough. 20 capsule 0  . Biotin 1  MG CAPS Take 1 mg by mouth daily.    . Brimonidine Tartrate (LUMIFY) 0.025 % SOLN Apply 1 drop to eye 2 (two) times daily. Both eyes    . docusate sodium (STOOL SOFTENER) 100 MG capsule Take 100 mg by mouth 2 (two) times daily.    Marland Kitchen estradiol (ESTRACE) 0.5 MG tablet TAKE 1 TABLET(0.5 MG) BY MOUTH DAILY 90 tablet 2  . fenofibrate (TRICOR) 48 MG tablet Take 1 tablet (48 mg total) by mouth daily. 90 tablet 3  . fluticasone (FLONASE) 50 MCG/ACT nasal spray Place 1 spray into both nostrils daily as needed for allergies or rhinitis.    . fluticasone (FLOVENT HFA) 44 MCG/ACT inhaler Inhale 2 puffs into the lungs in the morning and at bedtime. 1 each 12  . furosemide (LASIX) 20 MG tablet Take 1 tablet (20 mg total) by mouth daily. 30 tablet 3  . hydroxypropyl methylcellulose / hypromellose (ISOPTO TEARS / GONIOVISC) 2.5 % ophthalmic solution Place 1 drop into both eyes 3 (three) times daily as needed for dry eyes.    Marland Kitchen levothyroxine (SYNTHROID) 100 MCG tablet Take 1 tablet (100 mcg total) by mouth daily. 90 tablet 3  . pantoprazole (PROTONIX) 20 MG tablet TAKE 1 TABLET(20 MG) BY MOUTH DAILY 90 tablet 1  . potassium chloride (KLOR-CON) 10 MEQ tablet Take 1 tablet (10 mEq total) by mouth daily. 90 tablet 3  . Senna-Fennel (NATURAL VEGETABLE LAXATIVE) TABS Take 8 tablets by mouth at bedtime.    . sodium chloride (OCEAN) 0.65 % SOLN nasal spray Place 1 spray into both nostrils as needed for congestion.    Marland Kitchen oseltamivir (TAMIFLU) 75 MG capsule Take 1 capsule (75 mg total) by mouth 2 (two) times daily. 10 capsule 0   No facility-administered medications prior to visit.   Allergies  Allergen Reactions  . Tetracycline Hives and Itching  . Dilaudid [Hydromorphone Hcl] Nausea And Vomiting  . Other     Cigarette smoke and perfumes   Objective:   Today's Vitals   06/03/20 1127  BP: 118/72  Pulse: 70  Temp: (!) 97.4 F (36.3 C)  TempSrc: Temporal  SpO2: 99%  Weight: 156 lb 3.2 oz (70.9 kg)  Height: 5'  4" (1.626 m)   Body mass index is 26.81 kg/m.   General: Well developed, well nourished. No acute distress. HEENT: Normocephalic, non-traumatic. External ears normal. EAC and TMs normal bilaterally. Nose    has moderate congestion and clear rhinorrhea. Mucous membranes moist. Oropharynx clear. Good dentition. Lungs: Clear to auscultation bilaterally. No wheezing, rales or rhonchi. Psych: Alert and oriented. Normal mood and affect.  There are no preventive care reminders to display for this patient.    Assessment & Plan:   1. Mild persistent asthma with acute exacerbation I reviewed her chest x-ray report and the notes form her Urgent Care visit. Ms. Flegal lungs are clear today. I feel the  prednisone has helped to resolve any flare of her asthma. She may persist with some cough for 1-2 weeks. I do not see an indication for antibiotics at this point.  2. Seasonal allergic rhinitis due to pollen I recommend she resume use of her Flonase nasal spray. We also discussed the use of saline nasal washes for nasal hygiene and to reduce pollens in her nose. I recommend she reduce outdoor activities during the current pollen season, esp. in the afternoon or on breezy days.  Haydee Salter, MD

## 2020-06-12 ENCOUNTER — Encounter: Payer: Self-pay | Admitting: Family Medicine

## 2020-06-12 ENCOUNTER — Ambulatory Visit (INDEPENDENT_AMBULATORY_CARE_PROVIDER_SITE_OTHER): Payer: PPO | Admitting: Family Medicine

## 2020-06-12 ENCOUNTER — Other Ambulatory Visit: Payer: Self-pay

## 2020-06-12 DIAGNOSIS — L239 Allergic contact dermatitis, unspecified cause: Secondary | ICD-10-CM

## 2020-06-12 MED ORDER — PREDNISONE 10 MG PO TABS: ORAL_TABLET | ORAL | 0 refills | Status: AC

## 2020-06-12 NOTE — Progress Notes (Signed)
[facility] [address] [place] [address] Dept: [phone] Dept Fax: [phone]  Office Visit  Subjective:    Patient ID: Megan Valentine, female    DOB: 03/20/1950, 70 y.o..   MRN: [id number]  Chief Complaint  Patient presents with  . Acute Visit    C/o having hives on her neck/chest x 5 days.  No known contact with anything in the yard and is unsure of cause. She used Cortizone, calamine lotion and bacteriocin with little relief.     History of Present Illness:  Patient is in today for with a 5-day history of a rash on the neck, upper chest, and upper back. She notes this started while sitting outdoors int he shade. She is not certain if she might have had a bug bite. She has been using topical Cortizone, Caladryl, and bacitracin without improvement. She had not tried an oral antihistamine. She had a rash some years ago, but she feels this looks different.  Past Medical History: Patient Active Problem List   Diagnosis Date Noted  . Trigger point of shoulder region, left 02/25/2020  . Elevated glucose 08/15/2019  . Anemia 02/15/2019  . Abdominal pain 12/14/2018  . Sinus pressure 11/12/2018  . History of 2019 novel coronavirus disease (COVID-19) 11/12/2018  . Pleurisy 06/05/2018  . Enterocele 06/05/2018  . Rectocele 05/23/2018  . Vaginal vault prolapse 05/23/2018  . SUI (stress urinary incontinence, female) 05/23/2018  . Asthma 05/07/2018  . Stage 3a chronic kidney disease ([place]) 01/15/2018  . Edema 12/15/2017  . Insomnia 11/20/2017  . Seborrheic dermatitis 11/16/2017  . Seasonal allergic rhinitis due to pollen 11/16/2017  . Menopausal and female climacteric states 10/19/2017  . Constipation 03/26/2015  . Unilateral primary osteoarthritis, left knee 11/17/2014  . Hypokalemia 09/15/2011  . Hypothyroidism 08/21/2008  . Mixed hyperlipidemia 08/21/2008   Past Surgical History:  Procedure Laterality Date  .  ABDOMINAL HYSTERECTOMY  1999  . APPENDECTOMY  1962  . BREAST CYST ASPIRATION Bilateral   . CHOLECYSTECTOMY N/A 12/17/2018   Procedure: LAPAROSCOPIC CHOLECYSTECTOMY;  Surgeon: Clovis Riley, MD;  Location: [facility];  Service: General;  Laterality: N/A;  . EYE SURGERY    . JOINT REPLACEMENT     rt knee scope  . REFRACTIVE SURGERY  2000   both eyes  . TOTAL KNEE ARTHROPLASTY Right 03/24/2015   Procedure: TOTAL KNEE ARTHROPLASTY;  Surgeon: [name], MD;  Location: [facility];  Service: Orthopedics;  Laterality: Right;  . TUBAL LIGATION  1977   Family History  Problem Relation Age of Onset  . Stomach cancer Maternal Grandmother   . Hypertension Maternal Grandmother   . Diabetes Maternal Grandmother   . Stroke Maternal Grandmother   . Cancer Maternal Grandmother        Stomach cancer  . Colon cancer Paternal Grandfather   . Heart attack Paternal Grandfather   . Hyperlipidemia Mother   . Diabetes Mother   . Hypertension Mother   . Alzheimer's disease Mother   . Diabetes Father   . Hyperlipidemia Father   . Heart disease Father   . Heart attack Father   . Stroke Paternal Grandmother    Outpatient Medications Prior to Visit  Medication Sig Dispense Refill  . acetaminophen (TYLENOL) 500 MG tablet Take 1,000 mg by mouth every 6 (six) hours as needed for mild pain or headache.     . albuterol (VENTOLIN HFA) 108 (90 Base) MCG/ACT inhaler INHALE 2 PUFFS INTO THE LUNGS  EVERY 6 HOURS AS NEEDED FOR WHEEZING OR SHORTNESS OF BREATH 8.5 g 2  . aspirin 81 MG chewable tablet Chew 81 mg daily by mouth.    . benzonatate (TESSALON) 200 MG capsule Take 1 capsule (200 mg total) by mouth 3 (three) times daily as needed for cough. 20 capsule 0  . Biotin 1 MG CAPS Take 1 mg by mouth daily.    . Brimonidine Tartrate (LUMIFY) 0.025 % SOLN Apply 1 drop to eye 2 (two) times daily. Both eyes    . docusate sodium (COLACE) 100 MG capsule Take 100 mg by mouth 2 (two) times daily.    Marland Kitchen estradiol (ESTRACE)  0.5 MG tablet TAKE 1 TABLET(0.5 MG) BY MOUTH DAILY 90 tablet 2  . fenofibrate (TRICOR) 48 MG tablet Take 1 tablet (48 mg total) by mouth daily. 90 tablet 3  . fluticasone (FLONASE) 50 MCG/ACT nasal spray Place 1 spray into both nostrils daily as needed for allergies or rhinitis.    . fluticasone (FLOVENT HFA) 44 MCG/ACT inhaler Inhale 2 puffs into the lungs in the morning and at bedtime. 1 each 12  . furosemide (LASIX) 20 MG tablet Take 1 tablet (20 mg total) by mouth daily. 30 tablet 3  . Ginkgo Biloba (GINKOBA PO) Take by mouth.    . hydroxypropyl methylcellulose / hypromellose (ISOPTO TEARS / GONIOVISC) 2.5 % ophthalmic solution Place 1 drop into both eyes 3 (three) times daily as needed for dry eyes.    Marland Kitchen levothyroxine (SYNTHROID) 100 MCG tablet Take 1 tablet (100 mcg total) by mouth daily. 90 tablet 3  . Misc Natural Products (APPLE CIDER VINEGAR DIET) TABS Take by mouth. gummies    . pantoprazole (PROTONIX) 20 MG tablet TAKE 1 TABLET(20 MG) BY MOUTH DAILY 90 tablet 1  . potassium chloride (KLOR-CON) 10 MEQ tablet Take 1 tablet (10 mEq total) by mouth daily. 90 tablet 3  . Senna-Fennel (NATURAL VEGETABLE LAXATIVE) TABS Take 8 tablets by mouth at bedtime.    . sodium chloride (OCEAN) 0.65 % SOLN nasal spray Place 1 spray into both nostrils as needed for congestion.    Marland Kitchen zinc gluconate 50 MG tablet Take 50 mg by mouth daily.     No facility-administered medications prior to visit.   Allergies  Allergen Reactions  . Tetracycline Hives and Itching  . Dilaudid [Hydromorphone Hcl] Nausea And Vomiting  . Other     Cigarette smoke and perfumes   Objective:   General: Well developed, well nourished. No acute distress. Skin: There is a diffuse redness with a maculopapular rash across the neck, upper chest, and upper back. There is a 1/2 cm   ovate scaly patch over the left neck. Neuro:CN II-XII intact. Normal sensation and DTR bilaterally. Psych: Alert and oriented. Normal mood and  affect.  There are no preventive care reminders to display for this patient.   Assessment & Plan:   1. Allergic dermatitis There rash is non-specific. This could either be an allergic reaction to an environmental agent, an insect bite, or a photodermatitis. I recommend she take OTC Benadryl. I will prescribe a tapering dose of prednisone for use only if Benadryl is not helping over the next 24 hours. Follow-up as needed.  - predniSONE (DELTASONE) 10 MG tablet; Take 4 tablets (40 mg total) by mouth daily with breakfast for 1 day, THEN 3 tablets (30 mg total) daily with breakfast for 1 day, THEN 2 tablets (20 mg total) daily with breakfast for 1 day, THEN 1 tablet (  10 mg total) daily with breakfast for 1 day.  Dispense: 10 tablet; Refill: 0  Haydee Salter, MD

## 2020-06-17 ENCOUNTER — Other Ambulatory Visit: Payer: Self-pay | Admitting: Family

## 2020-06-23 ENCOUNTER — Other Ambulatory Visit: Payer: Self-pay | Admitting: Family Medicine

## 2020-06-30 ENCOUNTER — Other Ambulatory Visit: Payer: Self-pay

## 2020-06-30 ENCOUNTER — Ambulatory Visit (INDEPENDENT_AMBULATORY_CARE_PROVIDER_SITE_OTHER): Payer: PPO | Admitting: Family Medicine

## 2020-06-30 ENCOUNTER — Encounter: Payer: Self-pay | Admitting: Family Medicine

## 2020-06-30 VITALS — BP 102/68 | HR 64 | Temp 97.5°F | Ht 64.0 in | Wt 153.0 lb

## 2020-06-30 DIAGNOSIS — N1831 Chronic kidney disease, stage 3a: Secondary | ICD-10-CM | POA: Diagnosis not present

## 2020-06-30 DIAGNOSIS — R609 Edema, unspecified: Secondary | ICD-10-CM | POA: Diagnosis not present

## 2020-06-30 DIAGNOSIS — E782 Mixed hyperlipidemia: Secondary | ICD-10-CM | POA: Diagnosis not present

## 2020-06-30 DIAGNOSIS — E876 Hypokalemia: Secondary | ICD-10-CM | POA: Diagnosis not present

## 2020-06-30 DIAGNOSIS — E039 Hypothyroidism, unspecified: Secondary | ICD-10-CM | POA: Diagnosis not present

## 2020-06-30 DIAGNOSIS — Z789 Other specified health status: Secondary | ICD-10-CM | POA: Insufficient documentation

## 2020-06-30 MED ORDER — POTASSIUM CHLORIDE CRYS ER 20 MEQ PO TBCR: EXTENDED_RELEASE_TABLET | ORAL | 3 refills | Status: DC

## 2020-06-30 NOTE — Progress Notes (Signed)
Established Patient Office Visit  Subjective:  Patient ID: Megan Valentine, female    DOB: [birth year]  Age: 70 y.o. MRN: [id number]  CC:  Chief Complaint  Patient presents with  . Follow-up    3 month follow up on labs, patient fasting. No concerns.     HPI Megan Valentine presents for follow-up of her elevated triglycerides.  Fenofibrate treatment was recently initiated.  She has had no issue with the fenofibrate.  She has had muscle aches and fatigue with multiple statins her LDL levels are elevated with a very favorable HDL level.  She does not smoke.  She does not have hypertension.  ASCVD score slightly elevated at 8.4%.  She continues Lasix as needed for lower extremity swelling.  She is using it perhaps once weekly.  She is taking her Synthroid daily.  She has been able to lose a significant amount of weight by adjusting her diet.  Feels great.  Has less lower extremity swelling.  There is less pain in her left knee.  Past Medical History:  Diagnosis Date  . Allergy   . Arthritis   . Cataract   . Chronic bronchitis ([place])   . Complication of anesthesia    hard to wake up  . Diverticulosis   . Environmental allergies   . GERD (gastroesophageal reflux disease)   . Hiatal hernia   . History of chicken pox   . Hyperlipidemia   . Hypothyroidism   . IBS (irritable bowel syndrome)   . Migraines   . Mitral valve prolapse   . Rectal prolapse     Past Surgical History:  Procedure Laterality Date  . ABDOMINAL HYSTERECTOMY  1999  . APPENDECTOMY  1962  . BREAST CYST ASPIRATION Bilateral   . CHOLECYSTECTOMY N/A 12/17/2018   Procedure: LAPAROSCOPIC CHOLECYSTECTOMY;  Surgeon: Clovis Riley, MD;  Location: [facility];  Service: General;  Laterality: N/A;  . EYE SURGERY    . JOINT REPLACEMENT     rt knee scope  . REFRACTIVE SURGERY  2000   both eyes  . TOTAL KNEE ARTHROPLASTY Right 03/24/2015   Procedure: TOTAL KNEE ARTHROPLASTY;  Surgeon: [name], MD;  Location: [place] [facility];   Service: Orthopedics;  Laterality: Right;  . TUBAL LIGATION  1977    Family History  Problem Relation Age of Onset  . Stomach cancer Maternal Grandmother   . Hypertension Maternal Grandmother   . Diabetes Maternal Grandmother   . Stroke Maternal Grandmother   . Cancer Maternal Grandmother        Stomach cancer  . Colon cancer Paternal Grandfather   . Heart attack Paternal Grandfather   . Hyperlipidemia Mother   . Diabetes Mother   . Hypertension Mother   . Alzheimer's disease Mother   . Diabetes Father   . Hyperlipidemia Father   . Heart disease Father   . Heart attack Father   . Stroke Paternal Grandmother     Social History   Socioeconomic History  . Marital status: Widowed    Spouse name: Not on file  . Number of children: Not on file  . Years of education: 72  . Highest education level: Not on file  Occupational History  . 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 Smoking status: Former Smoker    Types: Cigarettes    Quit date: 03/23/1976    Years since quitting: 44.3  . Smokeless tobacco: Never  Used  Vaping Use  . Vaping Use: Never used  Substance and Sexual Activity  . Alcohol use: No  . Drug use: No  . Sexual activity: Yes    Birth control/protection: Abstinence  Other Topics Concern  . Not on file  Social History Narrative   Caffeine Use-yes   Regular exercise-no   Right handed    Lives alone   Social Determinants of Health   Financial Resource Strain: Low Risk   . Difficulty of Paying Living Expenses: Not hard at all  Food Insecurity: No Food Insecurity  . Worried About Charity fundraiser in the Last Year: Never true  . Ran Out of Food in the Last Year: Never true  Transportation Needs: Not on file  Physical Activity: Inactive  . Days of Exercise per Week: 0 days  . Minutes of Exercise per Session: 0 min  Stress: No Stress Concern Present  . Feeling of Stress : Not at all  Social  Connections: Moderately Integrated  . Frequency of Communication with Friends and Family: More than three times a week  . Frequency of Social Gatherings with Friends and Family: Once a week  . Attends Religious Services: 1 to 4 times per year  . Active Member of Clubs or Organizations: Yes  . Attends Archivist Meetings: Never  . Marital Status: Widowed  Intimate Partner Violence: Not At Risk  . Fear of Current or Ex-Partner: No  . Emotionally Abused: No  . Physically Abused: No  . Sexually Abused: No    Outpatient Medications Prior to Visit  Medication Sig Dispense Refill  . acetaminophen (TYLENOL) 500 MG tablet Take 1,000 mg by mouth every 6 (six) hours as needed for mild pain or headache.     . albuterol (VENTOLIN HFA) 108 (90 Base) MCG/ACT inhaler INHALE 2 PUFFS INTO THE LUNGS EVERY 6 HOURS AS NEEDED FOR WHEEZING OR SHORTNESS OF BREATH 8.5 g 2  . aspirin 81 MG chewable tablet Chew 81 mg daily by mouth.    . Biotin 1 MG CAPS Take 1 mg by mouth daily.    . Brimonidine Tartrate (LUMIFY) 0.025 % SOLN Apply 1 drop to eye 2 (two) times daily. Both eyes    . docusate sodium (COLACE) 100 MG capsule Take 100 mg by mouth 2 (two) times daily.    Marland Kitchen estradiol (ESTRACE) 0.5 MG tablet TAKE 1 TABLET(0.5 MG) BY MOUTH DAILY 90 tablet 2  . fenofibrate (TRICOR) 48 MG tablet Take 1 tablet (48 mg total) by mouth daily. 90 tablet 3  . fluticasone (FLONASE) 50 MCG/ACT nasal spray Place 1 spray into both nostrils daily as needed for allergies or rhinitis.    . fluticasone (FLOVENT HFA) 44 MCG/ACT inhaler Inhale 2 puffs into the lungs in the morning and at bedtime. 1 each 12  . furosemide (LASIX) 20 MG tablet Take 1 tablet (20 mg total) by mouth daily. 30 tablet 3  . Ginkgo Biloba (GINKOBA PO) Take by mouth.    . hydroxypropyl methylcellulose / hypromellose (ISOPTO TEARS / GONIOVISC) 2.5 % ophthalmic solution Place 1 drop into both eyes 3 (three) times daily as needed for dry eyes.    Marland Kitchen  levothyroxine (SYNTHROID) 100 MCG tablet Take 1 tablet (100 mcg total) by mouth daily. 90 tablet 3  . Misc Natural Products (APPLE CIDER VINEGAR DIET) TABS Take by mouth. gummies    . pantoprazole (PROTONIX) 20 MG tablet TAKE 1 TABLET(20 MG) BY MOUTH DAILY 90 tablet 1  . Senna-Fennel (  NATURAL VEGETABLE LAXATIVE) TABS Take 8 tablets by mouth at bedtime.    . sodium chloride (OCEAN) 0.65 % SOLN nasal spray Place 1 spray into both nostrils as needed for congestion.    Marland Kitchen zinc gluconate 50 MG tablet Take 50 mg by mouth daily.    . potassium chloride (KLOR-CON) 10 MEQ tablet Take 1 tablet (10 mEq total) by mouth daily. 90 tablet 3  . benzonatate (TESSALON) 200 MG capsule Take 1 capsule (200 mg total) by mouth 3 (three) times daily as needed for cough. (Patient not taking: Reported on 06/30/2020) 20 capsule 0   No facility-administered medications prior to visit.    Allergies  Allergen Reactions  . Tetracycline Hives and Itching  . Dilaudid [Hydromorphone Hcl] Nausea And Vomiting  . Other     Cigarette smoke and perfumes    ROS Review of Systems  Constitutional: Negative.   HENT: Negative.   Eyes: Negative for photophobia and visual disturbance.  Respiratory: Negative.   Cardiovascular: Negative.   Gastrointestinal: Negative.   Endocrine: Negative for polyphagia and polyuria.  Genitourinary: Negative.   Musculoskeletal: Negative.   Allergic/Immunologic: Negative for immunocompromised state.  Neurological: Negative.   Hematological: Does not bruise/bleed easily.  Psychiatric/Behavioral: Negative.       The 10-year ASCVD risk score Mikey Bussing DC Brooke Bonito., et al., 2013) is: 8.4%   Values used to calculate the score:     Age: 46 years     Sex: Female     Is Non-Hispanic African American: No     Diabetic: No     Tobacco smoker: No     Systolic Blood Pressure: 824 mmHg     Is BP treated: Yes     HDL Cholesterol: 74.2 mg/dL     Total Cholesterol: 272 mg/dL Objective:    Physical Exam Vitals  and nursing note reviewed.  Constitutional:      General: She is not in acute distress.    Appearance: Normal appearance. She is normal weight. She is not ill-appearing, toxic-appearing or diaphoretic.  HENT:     Head: Normocephalic and atraumatic.     Right Ear: Tympanic membrane, ear canal and external ear normal.     Left Ear: Tympanic membrane, ear canal and external ear normal.     Mouth/Throat:     Mouth: Mucous membranes are moist.     Pharynx: Oropharynx is clear. No oropharyngeal exudate or posterior oropharyngeal erythema.  Eyes:     General: No scleral icterus.       Right eye: No discharge.        Left eye: No discharge.     Extraocular Movements: Extraocular movements intact.     Conjunctiva/sclera: Conjunctivae normal.     Pupils: Pupils are equal, round, and reactive to light.  Neck:     Vascular: No carotid bruit.  Cardiovascular:     Rate and Rhythm: Normal rate and regular rhythm.  Pulmonary:     Effort: Pulmonary effort is normal.     Breath sounds: Normal breath sounds.  Abdominal:     General: Bowel sounds are normal. There is no distension.     Palpations: There is no mass.     Tenderness: There is no abdominal tenderness. There is no guarding or rebound.     Hernia: No hernia is present.  Musculoskeletal:     Cervical back: No rigidity or tenderness.     Right lower leg: No edema.     Left lower leg: No edema.  Lymphadenopathy:     Cervical: No cervical adenopathy.  Neurological:     Mental Status: She is alert and oriented to person, place, and time.  Psychiatric:        Mood and Affect: Mood normal.        Behavior: Behavior normal.     BP 102/68   Pulse 64   Temp (!) 97.5 F (36.4 C) (Temporal)   Ht 5\' 4"  (1.626 m)   Wt 153 lb (69.4 kg)   SpO2 98%   BMI 26.26 kg/m  Wt Readings from Last 3 Encounters:  06/30/20 153 lb (69.4 kg)  06/03/20 156 lb 3.2 oz (70.9 kg)  03/30/20 161 lb (73 kg)     Health Maintenance Due  Topic Date Due  .  Pneumococcal Vaccine 52-40 Years old (1 of 2 - PPSV23) Never done    There are no preventive care reminders to display for this patient.  Lab Results  Component Value Date   TSH 4.12 03/30/2020   Lab Results  Component Value Date   WBC 8.7 03/30/2020   HGB 14.5 03/30/2020   HCT 42.7 03/30/2020   MCV 94.8 03/30/2020   PLT 356.0 03/30/2020   Lab Results  Component Value Date   NA 142 03/30/2020   K 3.2 (L) 03/30/2020   CO2 28 03/30/2020   GLUCOSE 109 (H) 03/30/2020   BUN 18 03/30/2020   CREATININE 0.98 03/30/2020   BILITOT 0.4 08/15/2019   ALKPHOS 68 08/15/2019   AST 14 08/15/2019   ALT 11 08/15/2019   PROT 6.5 08/15/2019   ALBUMIN 4.0 08/15/2019   CALCIUM 9.6 03/30/2020   ANIONGAP 5 12/17/2018   GFR 58.67 (L) 03/30/2020   Lab Results  Component Value Date   CHOL 272 (H) 03/30/2020   Lab Results  Component Value Date   HDL 74.20 03/30/2020   Lab Results  Component Value Date   LDLCALC 133 (H) 02/18/2020   Lab Results  Component Value Date   TRIG 207.0 (H) 03/30/2020   Lab Results  Component Value Date   CHOLHDL 4 03/30/2020   Lab Results  Component Value Date   HGBA1C 5.7 02/18/2020      Assessment & Plan:   Problem List Items Addressed This Visit      Endocrine   Hypothyroidism     Genitourinary   Stage 3a chronic kidney disease ([place])   Relevant Orders   Basic metabolic panel     Other   Mixed hyperlipidemia   Relevant Orders   Lipid panel   Hypokalemia   Relevant Medications   potassium chloride SA (KLOR-CON) 20 MEQ tablet   Other Relevant Orders   Basic metabolic panel   Edema - Primary   Statin intolerance      Meds ordered this encounter  Medications  . potassium chloride SA (KLOR-CON) 20 MEQ tablet    Sig: Take potasium with each lasix tablet taken.    Dispense:  30 tablet    Refill:  3    Follow-up: Return in about 3 months (around 09/30/2020).   Advised patient to take a potassium every time she takes the Lasix.  She  will return fasting for above ordered blood work.  Advised her to take the levothyroxine daily on a fasting stomach. Libby Maw, MD

## 2020-07-01 ENCOUNTER — Other Ambulatory Visit (INDEPENDENT_AMBULATORY_CARE_PROVIDER_SITE_OTHER): Payer: PPO

## 2020-07-01 DIAGNOSIS — E782 Mixed hyperlipidemia: Secondary | ICD-10-CM

## 2020-07-01 DIAGNOSIS — N1831 Chronic kidney disease, stage 3a: Secondary | ICD-10-CM

## 2020-07-01 DIAGNOSIS — E876 Hypokalemia: Secondary | ICD-10-CM | POA: Diagnosis not present

## 2020-07-01 LAB — BASIC METABOLIC PANEL
BUN: 20 mg/dL (ref 6–23)
CO2: 25 mEq/L (ref 19–32)
Calcium: 8.7 mg/dL (ref 8.4–10.5)
Chloride: 108 mEq/L (ref 96–112)
Creatinine, Ser: 1.02 mg/dL (ref 0.40–1.20)
GFR: 55.83 mL/min — ABNORMAL LOW (ref >60.00)
Glucose, Bld: 92 mg/dL (ref 70–99)
Potassium: 3.6 mEq/L (ref 3.5–5.1)
Sodium: 140 mEq/L (ref 135–145)

## 2020-07-01 LAB — LIPID PANEL
Cholesterol: 195 mg/dL (ref 0–200)
HDL: 64.1 mg/dL (ref >39.00)
LDL Cholesterol: 107 mg/dL — ABNORMAL HIGH (ref 0–99)
NonHDL: 130.86
Total CHOL/HDL Ratio: 3
Triglycerides: 119 mg/dL (ref 0.0–149.0)
VLDL: 23.8 mg/dL (ref 0.0–40.0)

## 2020-07-01 NOTE — Progress Notes (Addendum)
Per orders of Dr. Ethelene Hal, pt is here for labs, pt is fasting,  pt tolerated draw well.

## 2020-07-06 ENCOUNTER — Encounter: Payer: Self-pay | Admitting: Family Medicine

## 2020-07-06 ENCOUNTER — Other Ambulatory Visit: Payer: Self-pay

## 2020-07-06 MED ORDER — FUROSEMIDE 20 MG PO TABS: 20.0000 mg | ORAL_TABLET | Freq: Every day | ORAL | 3 refills | Status: DC

## 2020-07-30 ENCOUNTER — Other Ambulatory Visit: Payer: Self-pay | Admitting: Family Medicine

## 2020-07-30 DIAGNOSIS — Z1231 Encounter for screening mammogram for malignant neoplasm of breast: Secondary | ICD-10-CM

## 2020-08-11 ENCOUNTER — Encounter: Payer: Self-pay | Admitting: Family Medicine

## 2020-08-13 ENCOUNTER — Other Ambulatory Visit: Payer: Self-pay

## 2020-08-13 ENCOUNTER — Encounter: Payer: Self-pay | Admitting: Family Medicine

## 2020-08-13 DIAGNOSIS — J301 Allergic rhinitis due to pollen: Secondary | ICD-10-CM

## 2020-08-13 MED ORDER — ALBUTEROL SULFATE HFA 108 (90 BASE) MCG/ACT IN AERS: INHALATION_SPRAY | RESPIRATORY_TRACT | 2 refills | Status: DC

## 2020-08-14 MED ORDER — ALBUTEROL SULFATE HFA 108 (90 BASE) MCG/ACT IN AERS: INHALATION_SPRAY | RESPIRATORY_TRACT | 2 refills | Status: DC

## 2020-08-19 ENCOUNTER — Telehealth: Payer: Self-pay

## 2020-08-19 NOTE — Progress Notes (Signed)
Chronic Care Management Pharmacy Assistant   Name: Megan Valentine  MRN: [id number] DOB: 11-22-1950  Reason for Encounter: Medication Review/General Adherence Call.  Recent office visits:  06/30/2020 Dr.Kremer MD (PCP)Advised patient to take a potassium 20 MEQ every time she takes the Lasix. 06/12/2020 Dr.Rudd MD (PCP Office)recommend OTC Benadryl,tapering dose of prednisone 10 mg 06/03/2020 Dr.Rudd MD (PCP Office) 03/30/2020 Dr. Gena Fray MD (PCP Office) start ferrous sulfate 325  MG tablet; Take 1 tablet by mouth 3  times a week. Mondays, Wednesdays, and Fridays,start fenofibrate 48 mg daily, Potassium level is recurrently mildly low. She has some potassium at home (20 meq tabs). I recommended she take 1/2 tab  daily 02/18/2020 Dutch Quint FNP (PCP Office) start Advair 100-50 mg daily 11/19/2019 Dutch Quint FNP (PCP Office) start  benzonatate  100 MG capsule; Take 2 capsules  by mouth 2  times daily as needed for cough, start promethazine-dextromethorphan 6.25-15 MG/5ML syrup; Take 5 mLs by mouth at bedtime as needed for cough,start  predniSONE (STERAPRED UNI-PAK 21 TAB) 10 MG (21) TBPK tablet 10/15/2019 Caroleen Hamman LPN (PCP Office) Annual wellness visit 10/10/2019 Dr. Ethelene Hal MD (PCP) Lasix has been discontinued because of the hypokalemia,  increase the levothyroxine to 112 mcg daily. 08/15/2019 Dr.Kremer MD (PCP) 06/27/2020 Wilfred Lacy started prednisone 10 mg Take 4tabs once a day x 2days, then 3tabs once a dayx 2days, then 2tabs once a day x3days, then, 1tab once a day x 3days, then stop  Recent consult visits:  05/07/2020 Dr.Kim MD (Family Medicine) Start oseltamivir (TAMIFLU) 75 MG capsule, Start benzonatate  100 MG capsule,can take 1000 IU (50mg) Vit D3 and 100-500 mg of Vit C daily per instructions 04/27/2020 SGala Romney(Ophthalmology) 04/15/2020 Dr. MZigmund DanielMD (Urology)  02/25/2020 Dr. WTanda RockersMD (Orthopedic Surgery) Trigger Point Injection given 11/28/2019 Dr. KMaudie MercuryMD  (Family Medicine) start Augmentin 875 bid x 7 days, ICS for 2-4 weeks, prn alb, refill on prednisone 10/28/2019 SGala Romney(Ophthalmology) 10/16/2019 Dr, MZigmund DanielMD (Urology) 10/01/2019 Dr. WDurward FortesMD (Orthopedic surgery) Orthovisc injection was given to the left knee 09/24/2019 Dr. WDurward FortesMD (Orthopedic surgery) Orthovisc injection was given to the left knee 09/17/2019 Dr. WDurward FortesMD (Orthopedic surgery) Orthovisc injection was given to the left knee 08/15/2019 Dr. WDurward FortesMD (Orthopedic surgery)  07/18/2019 Dr. ADelice LeschMD (Neurology) 07/17/2019 SGala Romney(Ophthalmology) 07/15/2019 Dr. ADelice LeschMD (Neurology) stop B12 07/11/2019 Dr. ADelice LeschMD (Neurology)  Hospital visits:  Medication Reconciliation was completed by comparing discharge summary, patient's EMR and Pharmacy list, and upon discussion with patient.  Admitted to the hospital on 05/08/2020 due to Viral URI with Cough. Discharge date was 05/08/2020. [facility] -started Prednisone 20 mg daily for the next 5 days due to possible asthma exacerbation  Medication Changes at Hospital Discharge: -Changed  increased dose of Tessalon Perles 200 mg 3 times daily  Medications Discontinued at Hospital Discharge: -Stopped none  Medications that remain the same after Hospital Discharge:??  -All other medications will remain the same.    Medications: Outpatient Encounter Medications as of 08/19/2020  Medication Sig   acetaminophen (TYLENOL) 500 MG tablet Take 1,000 mg by mouth every 6 (six) hours as needed for mild pain or headache.    albuterol (VENTOLIN HFA) 108 (90 Base) MCG/ACT inhaler INHALE 2 PUFFS INTO THE LUNGS EVERY 6 HOURS AS NEEDED FOR WHEEZING OR SHORTNESS OF BREATH   aspirin 81 MG chewable tablet Chew 81 mg daily  by mouth.   Biotin 1 MG CAPS Take 1 mg by mouth daily.   Brimonidine Tartrate (LUMIFY) 0.025 % SOLN Apply 1  drop to eye 2 (two) times daily. Both eyes   docusate sodium (COLACE) 100 MG capsule Take 100 mg by mouth 2 (two) times daily.   estradiol (ESTRACE) 0.5 MG tablet TAKE 1 TABLET(0.5 MG) BY MOUTH DAILY   fenofibrate (TRICOR) 48 MG tablet Take 1 tablet (48 mg total) by mouth daily.   fluticasone (FLONASE) 50 MCG/ACT nasal spray Place 1 spray into both nostrils daily as needed for allergies or rhinitis.   fluticasone (FLOVENT HFA) 44 MCG/ACT inhaler Inhale 2 puffs into the lungs in the morning and at bedtime.   furosemide (LASIX) 20 MG tablet Take 1 tablet (20 mg total) by mouth daily.   Ginkgo Biloba (GINKOBA PO) Take by mouth.   hydroxypropyl methylcellulose / hypromellose (ISOPTO TEARS / GONIOVISC) 2.5 % ophthalmic solution Place 1 drop into both eyes 3 (three) times daily as needed for dry eyes.   levothyroxine (SYNTHROID) 100 MCG tablet Take 1 tablet (100 mcg total) by mouth daily.   Misc Natural Products (APPLE CIDER VINEGAR DIET) TABS Take by mouth. gummies   pantoprazole (PROTONIX) 20 MG tablet TAKE 1 TABLET(20 MG) BY MOUTH DAILY   potassium chloride SA (KLOR-CON) 20 MEQ tablet Take potasium with each lasix tablet taken.   Senna-Fennel (NATURAL VEGETABLE LAXATIVE) TABS Take 8 tablets by mouth at bedtime.   sodium chloride (OCEAN) 0.65 % SOLN nasal spray Place 1 spray into both nostrils as needed for congestion.   zinc gluconate 50 MG tablet Take 50 mg by mouth daily.   No facility-administered encounter medications on file as of 08/19/2020.    Care Gaps: COVID-19 Vaccine Star Rating Drugs: None ID Medication Fill Gaps: Levothyroxine 100 MCG last filled 01/01/2020 90 day supply  Patient states she would like to unenroll in CCM service for her convenience.Notified Clinical Pharmacist.   Bessie Dodson Pharmacist Assistant [phone]

## 2020-08-24 ENCOUNTER — Other Ambulatory Visit: Payer: Self-pay | Admitting: Family Medicine

## 2020-08-24 DIAGNOSIS — E039 Hypothyroidism, unspecified: Secondary | ICD-10-CM

## 2020-08-25 ENCOUNTER — Encounter: Payer: Self-pay | Admitting: Family Medicine

## 2020-08-28 ENCOUNTER — Encounter: Payer: Self-pay | Admitting: Family Medicine

## 2020-09-19 ENCOUNTER — Encounter: Payer: Self-pay | Admitting: Family Medicine

## 2020-09-22 ENCOUNTER — Ambulatory Visit
Admission: RE | Admit: 2020-09-22 | Discharge: 2020-09-22 | Disposition: A | Discharge status: Home / Self Care | Payer: PPO | Source: Ambulatory Visit | Attending: Family Medicine | Admitting: Family Medicine

## 2020-09-22 ENCOUNTER — Other Ambulatory Visit: Payer: Self-pay

## 2020-09-22 DIAGNOSIS — Z1231 Encounter for screening mammogram for malignant neoplasm of breast: Secondary | ICD-10-CM

## 2020-09-30 ENCOUNTER — Ambulatory Visit (INDEPENDENT_AMBULATORY_CARE_PROVIDER_SITE_OTHER): Payer: PPO | Admitting: Family Medicine

## 2020-09-30 ENCOUNTER — Encounter: Payer: Self-pay | Admitting: Family Medicine

## 2020-09-30 ENCOUNTER — Other Ambulatory Visit: Payer: Self-pay

## 2020-09-30 VITALS — BP 110/70 | HR 65 | Temp 97.6°F | Ht 64.0 in | Wt 155.0 lb

## 2020-09-30 DIAGNOSIS — E039 Hypothyroidism, unspecified: Secondary | ICD-10-CM | POA: Diagnosis not present

## 2020-09-30 DIAGNOSIS — R609 Edema, unspecified: Secondary | ICD-10-CM

## 2020-09-30 DIAGNOSIS — Z789 Other specified health status: Secondary | ICD-10-CM | POA: Diagnosis not present

## 2020-09-30 DIAGNOSIS — T887XXA Unspecified adverse effect of drug or medicament, initial encounter: Secondary | ICD-10-CM | POA: Diagnosis not present

## 2020-09-30 DIAGNOSIS — Z0000 Encounter for general adult medical examination without abnormal findings: Secondary | ICD-10-CM

## 2020-09-30 DIAGNOSIS — N1831 Chronic kidney disease, stage 3a: Secondary | ICD-10-CM | POA: Diagnosis not present

## 2020-09-30 DIAGNOSIS — M25561 Pain in right knee: Secondary | ICD-10-CM

## 2020-09-30 DIAGNOSIS — G8929 Other chronic pain: Secondary | ICD-10-CM

## 2020-09-30 DIAGNOSIS — E782 Mixed hyperlipidemia: Secondary | ICD-10-CM | POA: Diagnosis not present

## 2020-09-30 DIAGNOSIS — Z23 Encounter for immunization: Secondary | ICD-10-CM | POA: Diagnosis not present

## 2020-09-30 LAB — LIPID PANEL
Cholesterol: 249 mg/dL — ABNORMAL HIGH (ref 0–200)
HDL: 66.6 mg/dL (ref >39.00)
LDL Cholesterol: 155 mg/dL — ABNORMAL HIGH (ref 0–99)
NonHDL: 182.17
Total CHOL/HDL Ratio: 4
Triglycerides: 135 mg/dL (ref 0.0–149.0)
VLDL: 27 mg/dL (ref 0.0–40.0)

## 2020-09-30 LAB — COMPREHENSIVE METABOLIC PANEL
ALT: 15 U/L (ref 0–35)
AST: 16 U/L (ref 0–37)
Albumin: 4.1 g/dL (ref 3.5–5.2)
Alkaline Phosphatase: 57 U/L (ref 39–117)
BUN: 15 mg/dL (ref 6–23)
CO2: 25 mEq/L (ref 19–32)
Calcium: 9.2 mg/dL (ref 8.4–10.5)
Chloride: 108 mEq/L (ref 96–112)
Creatinine, Ser: 0.93 mg/dL (ref 0.40–1.20)
GFR: 62.26 mL/min (ref >60.00)
Glucose, Bld: 89 mg/dL (ref 70–99)
Potassium: 3.9 mEq/L (ref 3.5–5.1)
Sodium: 144 mEq/L (ref 135–145)
Total Bilirubin: 0.4 mg/dL (ref 0.2–1.2)
Total Protein: 7 g/dL (ref 6.0–8.3)

## 2020-09-30 LAB — URINALYSIS, ROUTINE W REFLEX MICROSCOPIC
Bilirubin Urine: NEGATIVE
Hgb urine dipstick: NEGATIVE
Ketones, ur: NEGATIVE
Leukocytes,Ua: NEGATIVE
Nitrite: NEGATIVE
RBC / HPF: NONE SEEN (ref 0–?)
Specific Gravity, Urine: <=1.005 — AB (ref 1.000–1.030)
Total Protein, Urine: NEGATIVE
Urine Glucose: NEGATIVE
Urobilinogen, UA: 0.2 (ref 0.0–1.0)
WBC, UA: NONE SEEN (ref 0–?)
pH: 6 (ref 5.0–8.0)

## 2020-09-30 LAB — TSH: TSH: 6.15 u[IU]/mL — ABNORMAL HIGH (ref 0.35–5.50)

## 2020-09-30 NOTE — Progress Notes (Addendum)
Established Patient Office Visit  Subjective:  Patient ID: Megan Valentine, female    DOB: 08-29-1950  Age: 70 y.o. MRN: [id number]  CC:  Chief Complaint  Patient presents with   Follow-up    3 month follow up, no concerns. Patient fasting for labs.     HPI 49 B Megan Valentine presents for follow-up hyperlipidemia, hypothyroidism, lower extremity edema, right knee pain.  Megan Valentine was unable to tolerate the fenofibrate.  Megan Valentine said that it made the joints in her fingers hurt.  After Megan Valentine stopped it pain went away.  Megan Valentine continues to take 100 mcg of Synthroid each morning before breakfast.  TSH has trended upwards.  Megan Valentine is taking the Lasix once a week.  Megan Valentine is not taking supplemental potassium.  Megan Valentine is scheduled to have a right knee replacement in October.  Past Medical History:  Diagnosis Date   Allergy    Arthritis    Cataract    Chronic bronchitis (HCC)    Complication of anesthesia    hard to wake up   Diverticulosis    Environmental allergies    GERD (gastroesophageal reflux disease)    Hiatal hernia    History of chicken pox    Hyperlipidemia    Hypothyroidism    IBS (irritable bowel syndrome)    Migraines    Mitral valve prolapse    Rectal prolapse     Past Surgical History:  Procedure Laterality Date   ABDOMINAL HYSTERECTOMY  1999   APPENDECTOMY  1962   BREAST CYST ASPIRATION Bilateral    CHOLECYSTECTOMY N/A 12/17/2018   Procedure: LAPAROSCOPIC CHOLECYSTECTOMY;  Surgeon: Clovis Riley, MD;  Location: [facility];  Service: General;  Laterality: N/A;   EYE SURGERY     JOINT REPLACEMENT     rt knee scope   REFRACTIVE SURGERY  2000   both eyes   TOTAL KNEE ARTHROPLASTY Right 03/24/2015   Procedure: TOTAL KNEE ARTHROPLASTY;  Surgeon: [name], MD;  Location: [facility];  Service: Orthopedics;  Laterality: Right;   TUBAL LIGATION  1977    Family History  Problem Relation Age of Onset   Stomach cancer Maternal Grandmother    Hypertension Maternal Grandmother    Diabetes  Maternal Grandmother    Stroke Maternal Grandmother    Cancer Maternal Grandmother        Stomach cancer   Colon cancer Paternal Grandfather    Heart attack Paternal Grandfather    Hyperlipidemia Mother    Diabetes Mother    Hypertension Mother    Alzheimer's disease Mother    Diabetes Father    Hyperlipidemia Father    Heart disease Father    Heart attack Father    Stroke Paternal Grandmother     Social History   Socioeconomic History   Marital status: Widowed    Spouse name: Not on file   Number of children: Not on file   Years of education: 12   Highest education level: Not on file  Occupational History   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    Employer: [employer]  Tobacco Use   Smoking status: Former    Types: Cigarettes    Quit date: 03/23/1976    Years since quitting: 44.5   Smokeless tobacco: Never  Vaping Use   Vaping Use: Never used  Substance and Sexual Activity   Alcohol use: No   Drug use: No   Sexual activity: Yes    Birth control/protection: Abstinence  Other  Topics Concern   Not on file  Social History Narrative   Caffeine Use-yes   Regular exercise-no   Right handed    Lives alone   Social Determinants of Health   Financial Resource Strain: Low Risk    Difficulty of Paying Living Expenses: Not hard at all  Food Insecurity: No Food Insecurity   Worried About Charity fundraiser in the Last Year: Never true   Arboriculturist in the Last Year: Never true  Transportation Needs: Not on file  Physical Activity: Inactive   Days of Exercise per Week: 0 days   Minutes of Exercise per Session: 0 min  Stress: No Stress Concern Present   Feeling of Stress : Not at all  Social Connections: Moderately Integrated   Frequency of Communication with Friends and Family: More than three times a week   Frequency of Social Gatherings with Friends and Family: Once a week   Attends Religious Services: 1 to 4 times per year   Active Member of  [employer] or Organizations: Yes   Attends Archivist Meetings: Never   Marital Status: Widowed  Human resources officer Violence: Not At Risk   Fear of Current or Ex-Partner: No   Emotionally Abused: No   Physically Abused: No   Sexually Abused: No    Outpatient Medications Prior to Visit  Medication Sig Dispense Refill   acetaminophen (TYLENOL) 500 MG tablet Take 1,000 mg by mouth every 6 (six) hours as needed for mild pain or headache.      albuterol (VENTOLIN HFA) 108 (90 Base) MCG/ACT inhaler INHALE 2 PUFFS INTO THE LUNGS EVERY 6 HOURS AS NEEDED FOR WHEEZING OR SHORTNESS OF BREATH 8.5 g 2   aspirin 81 MG chewable tablet Chew 81 mg daily by mouth.     Biotin 1 MG CAPS Take 1 mg by mouth daily.     Brimonidine Tartrate (LUMIFY) 0.025 % SOLN Apply 1 drop to eye 2 (two) times daily. Both eyes     docusate sodium (COLACE) 100 MG capsule Take 100 mg by mouth 2 (two) times daily.     estradiol (ESTRACE) 0.5 MG tablet TAKE 1 TABLET(0.5 MG) BY MOUTH DAILY 90 tablet 2   fluticasone (FLOVENT HFA) 44 MCG/ACT inhaler Inhale 2 puffs into the lungs in the morning and at bedtime. 1 each 12   furosemide (LASIX) 20 MG tablet Take 1 tablet (20 mg total) by mouth daily. 30 tablet 3   Ginkgo Biloba (GINKOBA PO) Take by mouth.     hydroxypropyl methylcellulose / hypromellose (ISOPTO TEARS / GONIOVISC) 2.5 % ophthalmic solution Place 1 drop into both eyes 3 (three) times daily as needed for dry eyes.     levothyroxine (SYNTHROID) 100 MCG tablet TAKE 1 TABLET BY MOUTH DAILY...NEED ANNUAL FOR REFILLS 90 tablet 3   Misc Natural Products (APPLE CIDER VINEGAR DIET) TABS Take by mouth. gummies     pantoprazole (PROTONIX) 20 MG tablet TAKE 1 TABLET(20 MG) BY MOUTH DAILY 90 tablet 1   Senna-Fennel (NATURAL VEGETABLE LAXATIVE) TABS Take 8 tablets by mouth at bedtime.     zinc gluconate 50 MG tablet Take 50 mg by mouth daily.     sodium chloride (OCEAN) 0.65 % SOLN nasal spray Place 1 spray into both nostrils as  needed for congestion.     fluticasone (FLONASE) 50 MCG/ACT nasal spray Place 1 spray into both nostrils daily as needed for allergies or rhinitis. (Patient not taking: Reported on 09/30/2020)  potassium chloride SA (KLOR-CON) 20 MEQ tablet Take potasium with each lasix tablet taken. (Patient not taking: Reported on 09/30/2020) 30 tablet 3   fenofibrate (TRICOR) 48 MG tablet Take 1 tablet (48 mg total) by mouth daily. (Patient not taking: Reported on 09/30/2020) 90 tablet 3   No facility-administered medications prior to visit.    Allergies  Allergen Reactions   Tetracycline Hives and Itching   Dilaudid [Hydromorphone Hcl] Nausea And Vomiting   Other     Cigarette smoke and perfumes    ROS Review of Systems  Constitutional: Negative.   HENT: Negative.    Eyes:  Negative for photophobia and visual disturbance.  Respiratory: Negative.    Cardiovascular: Negative.   Gastrointestinal: Negative.   Endocrine: Negative for polyphagia and polyuria.  Genitourinary: Negative.   Musculoskeletal:  Positive for arthralgias.  Neurological:  Negative for speech difficulty and weakness.  Psychiatric/Behavioral: Negative.       Objective:    Physical Exam Vitals reviewed.  Constitutional:      General: Megan Valentine is not in acute distress.    Appearance: Normal appearance. Megan Valentine is not ill-appearing, toxic-appearing or diaphoretic.  HENT:     Head: Normocephalic and atraumatic.     Right Ear: Tympanic membrane, ear canal and external ear normal.     Left Ear: Tympanic membrane, ear canal and external ear normal.     Mouth/Throat:     Mouth: Mucous membranes are moist.     Pharynx: Oropharynx is clear. No oropharyngeal exudate or posterior oropharyngeal erythema.  Eyes:     Extraocular Movements: Extraocular movements intact.     Conjunctiva/sclera: Conjunctivae normal.     Pupils: Pupils are equal, round, and reactive to light.  Cardiovascular:     Rate and Rhythm: Normal rate and regular rhythm.   Pulmonary:     Effort: Pulmonary effort is normal.     Breath sounds: Normal breath sounds.  Musculoskeletal:     Cervical back: No rigidity or tenderness.     Right lower leg: No edema.     Left lower leg: No edema.  Lymphadenopathy:     Cervical: No cervical adenopathy.  Neurological:     Mental Status: Megan Valentine is alert and oriented to person, place, and time.  Psychiatric:        Mood and Affect: Mood normal.        Behavior: Behavior normal.    BP 110/70 (BP Location: Right Arm, Patient Position: Sitting, Cuff Size: Normal)   Pulse 65   Temp 97.6 F (36.4 C) (Temporal)   Ht '5\' 4"'$  (1.626 m)   Wt 155 lb (70.3 kg)   SpO2 99%   BMI 26.61 kg/m  Wt Readings from Last 3 Encounters:  09/30/20 155 lb (70.3 kg)  06/30/20 153 lb (69.4 kg)  06/03/20 156 lb 3.2 oz (70.9 kg)     There are no preventive care reminders to display for this patient.   There are no preventive care reminders to display for this patient.  Lab Results  Component Value Date   TSH 6.15 (H) 09/30/2020   Lab Results  Component Value Date   WBC 8.7 03/30/2020   HGB 14.5 03/30/2020   HCT 42.7 03/30/2020   MCV 94.8 03/30/2020   PLT 356.0 03/30/2020   Lab Results  Component Value Date   NA 144 09/30/2020   K 3.9 09/30/2020   CO2 25 09/30/2020   GLUCOSE 89 09/30/2020   BUN 15 09/30/2020   CREATININE 0.93 09/30/2020  BILITOT 0.4 09/30/2020   ALKPHOS 57 09/30/2020   AST 16 09/30/2020   ALT 15 09/30/2020   PROT 7.0 09/30/2020   ALBUMIN 4.1 09/30/2020   CALCIUM 9.2 09/30/2020   ANIONGAP 5 12/17/2018   GFR 62.26 09/30/2020   Lab Results  Component Value Date   CHOL 249 (H) 09/30/2020   Lab Results  Component Value Date   HDL 66.60 09/30/2020   Lab Results  Component Value Date   LDLCALC 155 (H) 09/30/2020   Lab Results  Component Value Date   TRIG 135.0 09/30/2020   Lab Results  Component Value Date   CHOLHDL 4 09/30/2020   Lab Results  Component Value Date   HGBA1C 5.7  02/18/2020      Assessment & Plan:   Problem List Items Addressed This Visit       Endocrine   Hypothyroidism   Relevant Orders   TSH (Completed)     Genitourinary   Stage 3a chronic kidney disease ([place])   Relevant Orders   Comprehensive metabolic panel (Completed)     Other   Mixed hyperlipidemia   Relevant Orders   Comprehensive metabolic panel (Completed)   Lipid panel (Completed)   Healthcare maintenance - Primary   Relevant Orders   Flu vaccine HIGH DOSE PF (Fluzone High dose) (Completed)   Edema   Relevant Orders   Comprehensive metabolic panel (Completed)   Urinalysis, Routine w reflex microscopic (Completed)   Statin intolerance   Chronic pain of right knee   Medication side effect    No orders of the defined types were placed in this encounter.   Follow-up: Return in about 3 months (around 12/30/2020).    Libby Maw, MD  9/ Addendum.  Patient reports compliance with her current dose of levothyroxine.  Megan Valentine has taken it each morning on a fasting stomach.  TSH was elevated.  Discontinue 100 mcg dose.  Start 112 mcg dose.  We will followed up in 3 months.

## 2020-10-01 ENCOUNTER — Encounter: Payer: Self-pay | Admitting: Family Medicine

## 2020-10-01 NOTE — Addendum Note (Signed)
Addended by: Jon Billings on: 10/01/2020 08:02 AM   Modules accepted: Orders

## 2020-10-02 MED ORDER — LEVOTHYROXINE SODIUM 112 MCG PO TABS: 112.0000 ug | ORAL_TABLET | Freq: Every day | ORAL | 0 refills | Status: DC

## 2020-10-02 NOTE — Addendum Note (Signed)
Addended by: Abelino Derrick A on: 10/02/2020 01:01 PM   Modules accepted: Orders

## 2020-10-02 NOTE — Telephone Encounter (Signed)
Spoke to patient VIA phone of lab results.  Advised that Dr Avel Peace recommendations.  She is taking her meds every morning on an empty stomach.  Result note sent back to provider to advise.  Dm/cma

## 2020-10-27 NOTE — Progress Notes (Signed)
Sent message, via epic in basket, requesting orders in epic from surgeon.  

## 2020-11-02 ENCOUNTER — Other Ambulatory Visit: Payer: Self-pay

## 2020-11-02 NOTE — Patient Instructions (Signed)
DUE TO COVID-19 ONLY ONE VISITOR IS ALLOWED TO COME WITH YOU AND STAY IN THE WAITING ROOM ONLY DURING PRE OP AND PROCEDURE.   **NO VISITORS ARE ALLOWED IN THE SHORT STAY AREA OR RECOVERY ROOM!!**  IF YOU WILL BE ADMITTED INTO THE HOSPITAL YOU ARE ALLOWED ONLY TWO SUPPORT PEOPLE DURING VISITATION HOURS ONLY (10AM -8PM)   The support person(s) may change daily. The support person(s) must pass our screening, gel in and out, and wear a mask at all times, including in the patient's room. Patients must also wear a mask when staff or their support person are in the room.  No visitors under the age of 27. Any visitor under the age of 55 must be accompanied by an adult.    COVID SWAB TESTING MUST BE COMPLETED ON:  11/13/20 **MUST PRESENT COMPLETED FORM AT TESTING SITE**    [facility] (backside of the building) No appointment needed. Open 8am-3pm. You are not required to quarantine, however you are required to wear a well-fitted mask when you are out and around people not in your household.  Hand Hygiene often Do NOT share personal items Notify your provider if you are in close contact with someone who has COVID or you develop fever 100.4 or greater, new onset of sneezing, cough, sore throat, shortness of breath or body aches.       Your procedure is scheduled on: 11/17/20   Report to [facility] Main Entrance   Report to Short Stay at 5:15 AM   [facility])   Call this number if you have problems the morning of surgery [phone]   Do not eat food :After Midnight.   May have liquids until 4:15 AM day of surgery  CLEAR LIQUID DIET  Foods Allowed                                                                     Foods Excluded  Water, Black Coffee and tea (no milk or creamer)           liquids that you cannot  Plain Jell-O in any flavor  (No red)                                    see through such as: Fruit ices (not with fruit pulp)                                             milk, soups, orange juice              Iced Popsicles (No red)                                               All solid food  Apple juices Sports drinks like Gatorade (No red) Lightly seasoned clear broth or consume(fat free) Sugar      The day of surgery:  Drink ONE (1) Pre-Surgery Clear Ensure or G2 by am the morning of surgery. Drink in one sitting. Do not sip.  This drink was given to you during your hospital  pre-op appointment visit. Nothing else to drink after completing the  Pre-Surgery Clear Ensure or G2.          If you have questions, please contact your surgeon's office.     Oral Hygiene is also important to reduce your risk of infection.                                    Remember - BRUSH YOUR TEETH THE MORNING OF SURGERY WITH YOUR REGULAR TOOTHPASTE   Do NOT smoke after Midnight   Take these medicines the morning of surgery with A SIP OF WATER: Inhalers, Levothyroxine, Claritin, Protonix             You may not have any metal on your body including hair pins, jewelry, and body piercing             Do not wear make-up, lotions, powders, perfumes, or deodorant  Do not wear nail polish including gel and S&S, artificial/acrylic nails, or any other type of covering on natural nails including finger and toenails. If you have artificial nails, gel coating, etc. that needs to be removed by a nail salon please have this removed prior to surgery or surgery may need to be canceled/ delayed if the surgeon/ anesthesia feels like they are unable to be safely monitored.   Do not shave  48 hours prior to surgery.    Do not bring valuables to the hospital. [place].   Contacts, dentures or bridgework may not be worn into surgery.   Bring small overnight bag day of surgery.   Special Instructions: Bring a copy of your healthcare power of attorney and living will documents          the day of surgery if you haven't scanned them before.              Please read over the following fact sheets you were given: IF YOU HAVE QUESTIONS ABOUT YOUR PRE-OP INSTRUCTIONS PLEASE CALL [phone]   [facility] - Preparing for Surgery Before surgery, you can play an important role.  Because skin is not sterile, your skin needs to be as free of germs as possible.  You can reduce the number of germs on your skin by washing with CHG (chlorahexidine gluconate) soap before surgery.  CHG is an antiseptic cleaner which kills germs and bonds with the skin to continue killing germs even after washing. Please DO NOT use if you have an allergy to CHG or antibacterial soaps.  If your skin becomes reddened/irritated stop using the CHG and inform your nurse when you arrive at Short Stay. Do not shave (including legs and underarms) for at least 48 hours prior to the first CHG shower.  You may shave your face/neck.  Please follow these instructions carefully:  1.  Shower with CHG Soap the night before surgery and the  morning of surgery.  2.  If you choose to wash your hair, wash your hair  first as usual with your normal  shampoo.  3.  After you shampoo, rinse your hair and body thoroughly to remove the shampoo.                             4.  Use CHG as you would any other liquid soap.  You can apply chg directly to the skin and wash.  Gently with a scrungie or clean washcloth.  5.  Apply the CHG Soap to your body ONLY FROM THE NECK DOWN.   Do   not use on face/ open                           Wound or open sores. Avoid contact with eyes, ears mouth and   genitals (private parts).                       Wash face,  Genitals (private parts) with your normal soap.             6.  Wash thoroughly, paying special attention to the area where your    surgery  will be performed.  7.  Thoroughly rinse your body with warm water from the neck down.  8.  DO NOT shower/wash with your normal soap after using and  rinsing off the CHG Soap.                9.  Pat yourself dry with a clean towel.            10.  Wear clean pajamas.            11.  Place clean sheets on your bed the night of your first shower and do not  sleep with pets. Day of Surgery : Do not apply any lotions/deodorants the morning of surgery.  Please wear clean clothes to the hospital/surgery center.  FAILURE TO FOLLOW THESE INSTRUCTIONS MAY RESULT IN THE CANCELLATION OF YOUR SURGERY  PATIENT SIGNATURE_________________________________  NURSE SIGNATURE__________________________________  ________________________________________________________________________

## 2020-11-02 NOTE — Progress Notes (Signed)
COVID swab appointment: 11/13/20  COVID Vaccine Completed: yes x4 Date COVID Vaccine completed: 03/07/19, 04/05/19 Has received booster: 12/07/19, 08/21/20 COVID vaccine manufacturer: Moderna     Date of COVID positive in last 90 days:  PCP - Abelino Derrick, MD Cardiologist -   Chest x-ray - 05/08/20 Epic EKG -  Stress Test -  ECHO - 05/02/19 Epic Cardiac Cath -  Pacemaker/ICD device last checked: Spinal Cord Stimulator:  Sleep Study -  CPAP -   Fasting Blood Sugar -  Checks Blood Sugar _____ times a day  Blood Thinner Instructions: Aspirin Instructions: Last Dose:  Activity level:  Can go up a flight of stairs and perform activities of daily living without stopping and without symptoms of chest pain or shortness of breath.   Able to exercise without symptoms  Unable to go up a flight of stairs without symptoms of      Anesthesia review:   Patient denies shortness of breath, fever, cough and chest pain at PAT appointment   Patient verbalized understanding of instructions that were given to them at the PAT appointment. Patient was also instructed that they will need to review over the PAT instructions again at home before surgery.

## 2020-11-03 ENCOUNTER — Ambulatory Visit: Payer: PPO | Admitting: Physician Assistant

## 2020-11-03 ENCOUNTER — Telehealth: Payer: Self-pay | Admitting: *Deleted

## 2020-11-03 ENCOUNTER — Other Ambulatory Visit: Payer: Self-pay | Admitting: Physician Assistant

## 2020-11-03 ENCOUNTER — Ambulatory Visit: Payer: Self-pay

## 2020-11-03 ENCOUNTER — Other Ambulatory Visit: Payer: Self-pay

## 2020-11-03 ENCOUNTER — Encounter: Payer: Self-pay | Admitting: Orthopedic Surgery

## 2020-11-03 DIAGNOSIS — M1712 Unilateral primary osteoarthritis, left knee: Secondary | ICD-10-CM | POA: Diagnosis not present

## 2020-11-03 NOTE — H&P (Deleted)
TOTAL KNEE ADMISSION H&P  Patient is being admitted for left total knee arthroplasty.  Subjective:  Chief Complaint:left knee pain.  HPI: Megan Valentine, 70 y.o. female, has a history of pain and functional disability in the left knee due to arthritis and has failed non-surgical conservative treatments for greater than 12 weeks to includeNSAID's and/or analgesics, corticosteriod injections, and activity modification.  Onset of symptoms was gradual, starting 7 years ago with gradually worsening course since that time. The patient noted no past surgery on the left knee(s).  Patient currently rates pain in the left knee(s) at 6 out of 10 with activity. Patient has worsening of pain with activity and weight bearing and joint swelling.  Patient has evidence of subchondral cysts and periarticular osteophytes by imaging studies. This patient has had  There is no active infection.  Patient Active Problem List   Diagnosis Date Noted   Chronic pain of right knee 09/30/2020   Medication side effect 09/30/2020   Statin intolerance 06/30/2020   Trigger point of shoulder region, left 02/25/2020   Elevated glucose 08/15/2019   Anemia 02/15/2019   Abdominal pain 12/14/2018   Sinus pressure 11/12/2018   History of 2019 novel coronavirus disease (COVID-19) 11/12/2018   Pleurisy 06/05/2018   Enterocele 06/05/2018   Rectocele 05/23/2018   Vaginal vault prolapse 05/23/2018   SUI (stress urinary incontinence, female) 05/23/2018   Asthma 05/07/2018   Stage 3a chronic kidney disease ([place]) 01/15/2018   Edema 12/15/2017   Insomnia 11/20/2017   Seborrheic dermatitis 11/16/2017   Seasonal allergic rhinitis due to pollen 11/16/2017   Menopausal and female climacteric states 10/19/2017   Constipation 03/26/2015   Unilateral primary osteoarthritis, left knee 11/17/2014   Healthcare maintenance 04/25/2014   Hypokalemia 09/15/2011   Hypothyroidism 08/21/2008   Mixed hyperlipidemia 08/21/2008   Past Medical  History:  Diagnosis Date   Allergy    Arthritis    Cataract    Chronic bronchitis ([place])    Complication of anesthesia    hard to wake up   Diverticulosis    Environmental allergies    GERD (gastroesophageal reflux disease)    Hiatal hernia    History of chicken pox    Hyperlipidemia    Hypothyroidism    IBS (irritable bowel syndrome)    Migraines    Mitral valve prolapse    Rectal prolapse     Past Surgical History:  Procedure Laterality Date   ABDOMINAL HYSTERECTOMY  1999   APPENDECTOMY  1962   BREAST CYST ASPIRATION Bilateral    CHOLECYSTECTOMY N/A 12/17/2018   Procedure: LAPAROSCOPIC CHOLECYSTECTOMY;  Surgeon: Clovis Riley, MD;  Location: [facility];  Service: General;  Laterality: N/A;   EYE SURGERY     JOINT REPLACEMENT     rt knee scope   REFRACTIVE SURGERY  2000   both eyes   TOTAL KNEE ARTHROPLASTY Right 03/24/2015   Procedure: TOTAL KNEE ARTHROPLASTY;  Surgeon: [name], MD;  Location: [facility];  Service: Orthopedics;  Laterality: Right;   TUBAL LIGATION  1977    No current facility-administered medications for this encounter.   Current Outpatient Medications  Medication Sig Dispense Refill Last Dose   acetaminophen (TYLENOL) 500 MG tablet Take 1,000 mg by mouth in the morning, at noon, and at bedtime.      albuterol (VENTOLIN HFA) 108 (90 Base) MCG/ACT inhaler INHALE 2 PUFFS INTO THE LUNGS EVERY 6 HOURS AS NEEDED FOR WHEEZING OR SHORTNESS OF BREATH 8.5 g 2  aspirin EC 81 MG tablet Take 81 mg by mouth in the morning. Swallow whole.      Biotin 5000 MCG TABS Take 5,000 mcg by mouth in the morning.      Brimonidine Tartrate (LUMIFY) 0.025 % SOLN Place 1 drop into both eyes 2 (two) times daily.      Cholecalciferol (VITAMIN D-3) 125 MCG (5000 UT) TABS Take 5,000 Units by mouth in the morning.      CRANBERRY PO Take 15,000 mg by mouth in the morning.      Cyanocobalamin (VITAMIN B-12) 5000 MCG TBDP Take 5,000 mcg by mouth in the morning.      docusate  sodium (COLACE) 250 MG capsule Take 1,000 mg by mouth at bedtime.      estradiol (ESTRACE) 0.5 MG tablet TAKE 1 TABLET(0.5 MG) BY MOUTH DAILY 90 tablet 2    fluticasone (FLONASE) 50 MCG/ACT nasal spray Place 1 spray into both nostrils daily as needed for allergies or rhinitis.      fluticasone (FLOVENT HFA) 44 MCG/ACT inhaler Inhale 2 puffs into the lungs in the morning and at bedtime. 1 each 12    furosemide (LASIX) 20 MG tablet Take 1 tablet (20 mg total) by mouth daily. (Patient taking differently: Take 20 mg by mouth daily as needed (fluid retention).) 30 tablet 3    Ginkgo Biloba 120 MG CAPS Take 120 mg by mouth in the morning.      hydroxypropyl methylcellulose / hypromellose (ISOPTO TEARS / GONIOVISC) 2.5 % ophthalmic solution Place 1 drop into both eyes 4 (four) times daily as needed for dry eyes.      levothyroxine (SYNTHROID) 112 MCG tablet Take 1 tablet (112 mcg total) by mouth daily. 90 tablet 0    loratadine (CLARITIN) 10 MG tablet Take 10 mg by mouth daily as needed for allergies.      Multiple Vitamins-Minerals (HAIR/SKIN/NAILS/BIOTIN PO) Take 3 tablets by mouth in the morning.      pantoprazole (PROTONIX) 20 MG tablet TAKE 1 TABLET(20 MG) BY MOUTH DAILY 90 tablet 1    Probiotic Product (PROBIOTIC PO) Take 4 capsules by mouth in the morning.      Sennosides 17.2 MG TABS Take 172 mg by mouth at bedtime.      Zinc 50 MG TABS Take 50 mg by mouth in the morning.      APPLE CIDER VINEGAR PO Take 2 tablets by mouth in the morning. gummies      potassium chloride SA (KLOR-CON) 20 MEQ tablet Take potasium with each lasix tablet taken. 30 tablet 3 Not Taking   Allergies  Allergen Reactions   Tetracycline Hives and Itching   Dilaudid [Hydromorphone Hcl] Nausea And Vomiting   Other     Cigarette smoke and perfumes--flares asthma (develops bronchitis)    Social History   Tobacco Use   Smoking status: Former    Types: Cigarettes    Quit date: 03/23/1976    Years since quitting: 44.6    Smokeless tobacco: Never  Substance Use Topics   Alcohol use: No    Family History  Problem Relation Age of Onset   Stomach cancer Maternal Grandmother    Hypertension Maternal Grandmother    Diabetes Maternal Grandmother    Stroke Maternal Grandmother    Cancer Maternal Grandmother        Stomach cancer   Colon cancer Paternal Grandfather    Heart attack Paternal Grandfather    Hyperlipidemia Mother    Diabetes Mother  Hypertension Mother    Alzheimer's disease Mother    Diabetes Father    Hyperlipidemia Father    Heart disease Father    Heart attack Father    Stroke Paternal Grandmother      Review of Systems  All other systems reviewed and are negative.  Objective:  Physical Exam  Vital signs in last 24 hours:    Labs:   Estimated body mass index is 26.61 kg/m as calculated from the following:   Height as of 09/30/20: 5\' 4"  (1.626 m).   Weight as of 09/30/20: 70.3 kg.   Imaging Review Plain radiographs demonstrate moderate degenerative joint disease of the left knee(s). The overall alignment issignificant varus. The bone quality appears to be good for age and reported activity level.      Assessment/Plan:  End stage arthritis, left knee   The patient history, physical examination, clinical judgment of the provider and imaging studies are consistent with end stage degenerative joint disease of the left knee(s) and total knee arthroplasty is deemed medically necessary. The treatment options including medical management, injection therapy arthroscopy and arthroplasty were discussed at length. The risks and benefits of total knee arthroplasty were presented and reviewed. The risks due to aseptic loosening, infection, stiffness, patella tracking problems, thromboembolic complications and other imponderables were discussed. The patient acknowledged the explanation, agreed to proceed with the plan and consent was signed. Patient is being admitted for inpatient  treatment for surgery, pain control, PT, OT, prophylactic antibiotics, VTE prophylaxis, progressive ambulation and ADL's and discharge planning. The patient is planning to be discharged home with home health services     Patient's anticipated LOS is less than 2 midnights, meeting these requirements: - Younger than 67 - Lives within 1 hour of care - Has a competent adult at home to recover with post-op recover - NO history of  - Chronic pain requiring opiods  - Diabetes  - Coronary Artery Disease  - Heart failure  - Heart attack  - Stroke  - DVT/VTE  - Cardiac arrhythmia  - Respiratory Failure/COPD  - Renal failure  - Anemia  - Advanced Liver disease

## 2020-11-03 NOTE — H&P (Signed)
TOTAL KNEE ADMISSION H&P  Patient is being admitted for left total knee arthroplasty.  Subjective:  Chief Complaint:left knee pain.  HPI: Megan Valentine, 70 y.o. female, has a history of pain and functional disability in the left knee due to arthritis and has failed non-surgical conservative treatments for greater than 12 weeks to includeNSAID's and/or analgesics, corticosteriod injections, and activity modification.  Onset of symptoms was gradual, starting 7 years ago with gradually worsening course since that time. The patient noted no past surgery on the left knee(s).  Patient currently rates pain in the left knee(s) at 8 out of 10 with activity. Patient has night pain, worsening of pain with activity and weight bearing, and joint swelling.  Patient has evidence of periarticular osteophytes and joint space narrowing by imaging studies. This patient has had    . There is no active infection.  Patient Active Problem List   Diagnosis Date Noted   Chronic pain of right knee 09/30/2020   Medication side effect 09/30/2020   Statin intolerance 06/30/2020   Trigger point of shoulder region, left 02/25/2020   Elevated glucose 08/15/2019   Anemia 02/15/2019   Abdominal pain 12/14/2018   Sinus pressure 11/12/2018   History of 2019 novel coronavirus disease (COVID-19) 11/12/2018   Pleurisy 06/05/2018   Enterocele 06/05/2018   Rectocele 05/23/2018   Vaginal vault prolapse 05/23/2018   SUI (stress urinary incontinence, female) 05/23/2018   Asthma 05/07/2018   Stage 3a chronic kidney disease ([place]) 01/15/2018   Edema 12/15/2017   Insomnia 11/20/2017   Seborrheic dermatitis 11/16/2017   Seasonal allergic rhinitis due to pollen 11/16/2017   Menopausal and female climacteric states 10/19/2017   Constipation 03/26/2015   Unilateral primary osteoarthritis, left knee 11/17/2014   Healthcare maintenance 04/25/2014   Hypokalemia 09/15/2011   Hypothyroidism 08/21/2008   Mixed hyperlipidemia  08/21/2008   Past Medical History:  Diagnosis Date   Allergy    Arthritis    Cataract    Chronic bronchitis ([place])    Complication of anesthesia    hard to wake up   Diverticulosis    Environmental allergies    GERD (gastroesophageal reflux disease)    Hiatal hernia    History of chicken pox    Hyperlipidemia    Hypothyroidism    IBS (irritable bowel syndrome)    Migraines    Mitral valve prolapse    Rectal prolapse     Past Surgical History:  Procedure Laterality Date   ABDOMINAL HYSTERECTOMY  1999   APPENDECTOMY  1962   BREAST CYST ASPIRATION Bilateral    CHOLECYSTECTOMY N/A 12/17/2018   Procedure: LAPAROSCOPIC CHOLECYSTECTOMY;  Surgeon: Clovis Riley, MD;  Location: [facility];  Service: General;  Laterality: N/A;   EYE SURGERY     JOINT REPLACEMENT     rt knee scope   REFRACTIVE SURGERY  2000   both eyes   TOTAL KNEE ARTHROPLASTY Right 03/24/2015   Procedure: TOTAL KNEE ARTHROPLASTY;  Surgeon: [name], MD;  Location: [facility];  Service: Orthopedics;  Laterality: Right;   TUBAL LIGATION  1977    Current Outpatient Medications  Medication Sig Dispense Refill Last Dose   acetaminophen (TYLENOL) 500 MG tablet Take 1,000 mg by mouth in the morning, at noon, and at bedtime.      albuterol (VENTOLIN HFA) 108 (90 Base) MCG/ACT inhaler INHALE 2 PUFFS INTO THE LUNGS EVERY 6 HOURS AS NEEDED FOR WHEEZING OR SHORTNESS OF BREATH 8.5 g 2    APPLE CIDER VINEGAR  PO Take 2 tablets by mouth in the morning. gummies      aspirin EC 81 MG tablet Take 81 mg by mouth in the morning. Swallow whole.      Biotin 5000 MCG TABS Take 5,000 mcg by mouth in the morning.      Brimonidine Tartrate (LUMIFY) 0.025 % SOLN Place 1 drop into both eyes 2 (two) times daily.      Cholecalciferol (VITAMIN D-3) 125 MCG (5000 UT) TABS Take 5,000 Units by mouth in the morning.      CRANBERRY PO Take 15,000 mg by mouth in the morning.      Cyanocobalamin (VITAMIN B-12) 5000 MCG TBDP Take 5,000 mcg by mouth  in the morning.      docusate sodium (COLACE) 250 MG capsule Take 1,000 mg by mouth at bedtime.      estradiol (ESTRACE) 0.5 MG tablet TAKE 1 TABLET(0.5 MG) BY MOUTH DAILY 90 tablet 2    fluticasone (FLONASE) 50 MCG/ACT nasal spray Place 1 spray into both nostrils daily as needed for allergies or rhinitis.      fluticasone (FLOVENT HFA) 44 MCG/ACT inhaler Inhale 2 puffs into the lungs in the morning and at bedtime. 1 each 12    furosemide (LASIX) 20 MG tablet Take 1 tablet (20 mg total) by mouth daily. (Patient taking differently: Take 20 mg by mouth daily as needed (fluid retention).) 30 tablet 3    Ginkgo Biloba 120 MG CAPS Take 120 mg by mouth in the morning.      hydroxypropyl methylcellulose / hypromellose (ISOPTO TEARS / GONIOVISC) 2.5 % ophthalmic solution Place 1 drop into both eyes 4 (four) times daily as needed for dry eyes.      levothyroxine (SYNTHROID) 112 MCG tablet Take 1 tablet (112 mcg total) by mouth daily. 90 tablet 0    loratadine (CLARITIN) 10 MG tablet Take 10 mg by mouth daily as needed for allergies.      Multiple Vitamins-Minerals (HAIR/SKIN/NAILS/BIOTIN PO) Take 3 tablets by mouth in the morning.      pantoprazole (PROTONIX) 20 MG tablet TAKE 1 TABLET(20 MG) BY MOUTH DAILY 90 tablet 1    potassium chloride SA (KLOR-CON) 20 MEQ tablet Take potasium with each lasix tablet taken. 30 tablet 3    Probiotic Product (PROBIOTIC PO) Take 4 capsules by mouth in the morning.      Sennosides 17.2 MG TABS Take 172 mg by mouth at bedtime.      Zinc 50 MG TABS Take 50 mg by mouth in the morning.      No current facility-administered medications for this visit.   Allergies  Allergen Reactions   Tetracycline Hives and Itching   Dilaudid [Hydromorphone Hcl] Nausea And Vomiting   Other     Cigarette smoke and perfumes--flares asthma (develops bronchitis)    Social History   Tobacco Use   Smoking status: Former    Types: Cigarettes    Quit date: 03/23/1976    Years since  quitting: 44.6   Smokeless tobacco: Never  Substance Use Topics   Alcohol use: No    Family History  Problem Relation Age of Onset   Stomach cancer Maternal Grandmother    Hypertension Maternal Grandmother    Diabetes Maternal Grandmother    Stroke Maternal Grandmother    Cancer Maternal Grandmother        Stomach cancer   Colon cancer Paternal Grandfather    Heart attack Paternal Grandfather    Hyperlipidemia Mother  Diabetes Mother    Hypertension Mother    Alzheimer's disease Mother    Diabetes Father    Hyperlipidemia Father    Heart disease Father    Heart attack Father    Stroke Paternal Grandmother      Review of Systems  All other systems reviewed and are negative.  Objective:  Physical Exam  Vital signs in last 24 hours: @VSRANGES @  Labs:   Estimated body mass index is 26.61 kg/m as calculated from the following:   Height as of 09/30/20: 5\' 4"  (1.626 m).   Weight as of 09/30/20: 70.3 kg.   Imaging Review Plain radiographs demonstrate severe degenerative joint disease of the left knee(s). The overall alignment issignificant varus. The bone quality appears to be good for age and reported activity level.      Assessment/Plan:  End stage arthritis, left knee   The patient history, physical examination, clinical judgment of the provider and imaging studies are consistent with end stage degenerative joint disease of the left knee(s) and total knee arthroplasty is deemed medically necessary. The treatment options including medical management, injection therapy arthroscopy and arthroplasty were discussed at length. The risks and benefits of total knee arthroplasty were presented and reviewed. The risks due to aseptic loosening, infection, stiffness, patella tracking problems, thromboembolic complications and other imponderables were discussed. The patient acknowledged the explanation, agreed to proceed with the plan and consent was signed. Patient is being  admitted for inpatient treatment for surgery, pain control, PT, OT, prophylactic antibiotics, VTE prophylaxis, progressive ambulation and ADL's and discharge planning. The patient is planning to be discharged home with home health services     Patient's anticipated LOS is less than 2 midnights, meeting these requirements: - Younger than 5 - Lives within 1 hour of care - Has a competent adult at home to recover with post-op recover - NO history of  - Chronic pain requiring opiods  - Diabetes  - Coronary Artery Disease  - Heart failure  - Heart attack  - Stroke  - DVT/VTE  - Cardiac arrhythmia  - Respiratory Failure/COPD  - Renal failure  - Anemia  - Advanced Liver disease

## 2020-11-03 NOTE — Care Plan (Signed)
[facility] office RNCM met with patient during her H&P appointment today with Bevely Palmer Persons, PA-C for Dr. Durward Fortes. She is an [facility] patient through [facility] and is agreeable to case management. She has a RW and 3in1 at home already. She states her RW may not be accessible, but she will let me know if she needs another. Her home CPM will be ordered through [facility] to be brought to her prior to surgery. Anticipate need for HHPT after short hospital stay. Referral made to [facility] after choice provided. Also she would like to schedule her OPPT here at [facility] when appropriate. CM will assist with this. Reviewed all post op care instructions. Will continue to follow for needs.

## 2020-11-03 NOTE — Care Plan (Signed)
Ortho bundle pre-op call completed. 

## 2020-11-03 NOTE — Progress Notes (Signed)
Office Visit Note   Patient: Megan Valentine           Date of Birth: October 05, 1950           MRN: [id number] Visit Date: 11/03/2020              Requested by: Libby Maw, [place],   [id number] PCP: Libby Maw, MD  Chief Complaint  Patient presents with   Left Knee - Follow-up    H&P      HPI: Patient is a pleasant 70 year old woman who presents today for history and physical for upcoming left knee replacement.  She had a right knee replacement a few years ago and did have to stay in the hospital a few DAYS because of some nausea and severe vomiting with Dilaudid and low blood pressure.  She is no longer taking her antihypertensives.  She denies any history of blood clot shortness of breath or difficulty with anesthesia.  Assessment & Plan: Visit Diagnoses:  1. Unilateral primary osteoarthritis, left knee     Plan: History and physical was completed today.  Reviewed the risks of surgery which include but are not limited to bleeding, infection, anesthesia complications, blood clots and need for future surgery.  She did have an extensive discussion with our nurse manager regarding outcomes and what to expect.  Her H&P was completed  Follow-Up Instructions: No follow-ups on file.   Ortho Exam  Patient is alert, oriented, no adenopathy, well-dressed, normal affect, normal respiratory effort. Left knee skin is in good condition no cuts bruises or skin tears.  No effusion no erythema crepitus and pain with range of motion  Imaging: XR Knee 1-2 Views Left  Result Date: 11/03/2020 2 view radiographs of the left knee were obtained today.  On the right knee on the AP she is status post right total knee arthroplasty excellent alignment no evidence of loosening of the components.  On the left side she has advanced degenerative changes with varus arthritis of the left knee.  She has osteophytes especially medially.  Also patellofemoral  arthrosis.  No images are attached to the encounter.  Labs: Lab Results  Component Value Date   HGBA1C 5.7 02/18/2020   HGBA1C 5.8 08/15/2019   ESRSEDRATE 4 07/11/2019   ESRSEDRATE 17 09/16/2011   CRP <1.0 07/11/2019   REPTSTATUS 03/26/2015 FINAL 03/24/2015   CULT 8,000 COLONIES/mL INSIGNIFICANT GROWTH 03/24/2015     Lab Results  Component Value Date   ALBUMIN 4.1 09/30/2020   ALBUMIN 4.0 08/15/2019   ALBUMIN 2.6 (L) 12/17/2018    No results found for: MG Lab Results  Component Value Date   VD25OH 51.53 08/15/2019   VD25OH 33.23 01/04/2019   VD25OH 40.05 10/19/2017    No results found for: PREALBUMIN CBC EXTENDED Latest Ref Rng & Units 03/30/2020 02/18/2020 08/15/2019  WBC 4.0 - 10.5 K/uL 8.7 7.0 6.4  RBC 3.87 - 5.11 Mil/uL 4.51 4.17 4.21  HGB 12.0 - 15.0 g/dL 14.5 13.2 13.5  HCT 36.0 - 46.0 % 42.7 39.7 40.0  PLT 150.0 - 400.0 K/uL 356.0 292.0 292.0  NEUTROABS 1.4 - 7.7 K/uL - 4.0 -  LYMPHSABS 0.7 - 4.0 K/uL - 2.0 -     There is no height or weight on file to calculate BMI.  Orders:  Orders Placed This Encounter  Procedures   XR Knee 1-2 Views Left   No orders of the defined types were placed in this encounter.  Procedures: No procedures performed  Clinical Data: No additional findings.  ROS:  All other systems negative, except as noted in the HPI. Review of Systems  Objective: Vital Signs: There were no vitals taken for this visit.  Specialty Comments:  No specialty comments available.  PMFS History: Patient Active Problem List   Diagnosis Date Noted   Chronic pain of right knee 09/30/2020   Medication side effect 09/30/2020   Statin intolerance 06/30/2020   Trigger point of shoulder region, left 02/25/2020   Elevated glucose 08/15/2019   Anemia 02/15/2019   Abdominal pain 12/14/2018   Sinus pressure 11/12/2018   History of 2019 novel coronavirus disease (COVID-19) 11/12/2018   Pleurisy 06/05/2018   Enterocele 06/05/2018   Rectocele  05/23/2018   Vaginal vault prolapse 05/23/2018   SUI (stress urinary incontinence, female) 05/23/2018   Asthma 05/07/2018   Stage 3a chronic kidney disease ([place]) 01/15/2018   Edema 12/15/2017   Insomnia 11/20/2017   Seborrheic dermatitis 11/16/2017   Seasonal allergic rhinitis due to pollen 11/16/2017   Menopausal and female climacteric states 10/19/2017   Constipation 03/26/2015   Unilateral primary osteoarthritis, left knee 11/17/2014   Healthcare maintenance 04/25/2014   Hypokalemia 09/15/2011   Hypothyroidism 08/21/2008   Mixed hyperlipidemia 08/21/2008   Past Medical History:  Diagnosis Date   Allergy    Arthritis    Cataract    Chronic bronchitis ([place])    Complication of anesthesia    hard to wake up   Diverticulosis    Environmental allergies    GERD (gastroesophageal reflux disease)    Hiatal hernia    History of chicken pox    Hyperlipidemia    Hypothyroidism    IBS (irritable bowel syndrome)    Migraines    Mitral valve prolapse    Rectal prolapse     Family History  Problem Relation Age of Onset   Stomach cancer Maternal Grandmother    Hypertension Maternal Grandmother    Diabetes Maternal Grandmother    Stroke Maternal Grandmother    Cancer Maternal Grandmother        Stomach cancer   Colon cancer Paternal Grandfather    Heart attack Paternal Grandfather    Hyperlipidemia Mother    Diabetes Mother    Hypertension Mother    Alzheimer's disease Mother    Diabetes Father    Hyperlipidemia Father    Heart disease Father    Heart attack Father    Stroke Paternal Grandmother     Past Surgical History:  Procedure Laterality Date   ABDOMINAL HYSTERECTOMY  1999   APPENDECTOMY  1962   BREAST CYST ASPIRATION Bilateral    CHOLECYSTECTOMY N/A 12/17/2018   Procedure: LAPAROSCOPIC CHOLECYSTECTOMY;  Surgeon: Clovis Riley, MD;  Location: [facility];  Service: General;  Laterality: N/A;   EYE SURGERY     JOINT REPLACEMENT     rt knee scope   REFRACTIVE  SURGERY  2000   both eyes   TOTAL KNEE ARTHROPLASTY Right 03/24/2015   Procedure: TOTAL KNEE ARTHROPLASTY;  Surgeon: [name], MD;  Location: [facility];  Service: Orthopedics;  Laterality: Right;   TUBAL LIGATION  1977   Social History   Occupational History   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

## 2020-11-03 NOTE — H&P (Signed)
TOTAL KNEE ADMISSION H&P  Patient is being admitted for left total knee arthroplasty.  Subjective:  Chief Complaint:left knee pain.  HPI: Megan Valentine, 70 y.o. female, has a history of pain and functional disability in the left knee due to arthritis and has failed non-surgical conservative treatments for greater than 12 weeks to includeNSAID's and/or analgesics, corticosteriod injections, use of assistive devices, and activity modification.  Onset of symptoms was gradual, starting 7 years ago with gradually worsening course since that time. The patient noted no past surgery on the left knee(s).  Patient currently rates pain in the left knee(s) at 6 out of 10 with activity. Patient has night pain, worsening of pain with activity and weight bearing, pain that interferes with activities of daily living, pain with passive range of motion, and crepitus.  Patient has evidence of subchondral cysts and periarticular osteophytes by imaging studies.. There is no active infection.  Patient Active Problem List   Diagnosis Date Noted   Chronic pain of right knee 09/30/2020   Medication side effect 09/30/2020   Statin intolerance 06/30/2020   Trigger point of shoulder region, left 02/25/2020   Elevated glucose 08/15/2019   Anemia 02/15/2019   Abdominal pain 12/14/2018   Sinus pressure 11/12/2018   History of 2019 novel coronavirus disease (COVID-19) 11/12/2018   Pleurisy 06/05/2018   Enterocele 06/05/2018   Rectocele 05/23/2018   Vaginal vault prolapse 05/23/2018   SUI (stress urinary incontinence, female) 05/23/2018   Asthma 05/07/2018   Stage 3a chronic kidney disease ([place]) 01/15/2018   Edema 12/15/2017   Insomnia 11/20/2017   Seborrheic dermatitis 11/16/2017   Seasonal allergic rhinitis due to pollen 11/16/2017   Menopausal and female climacteric states 10/19/2017   Constipation 03/26/2015   Unilateral primary osteoarthritis, left knee 11/17/2014   Healthcare maintenance 04/25/2014    Hypokalemia 09/15/2011   Hypothyroidism 08/21/2008   Mixed hyperlipidemia 08/21/2008   Past Medical History:  Diagnosis Date   Allergy    Arthritis    Cataract    Chronic bronchitis ([place])    Complication of anesthesia    hard to wake up   Diverticulosis    Environmental allergies    GERD (gastroesophageal reflux disease)    Hiatal hernia    History of chicken pox    Hyperlipidemia    Hypothyroidism    IBS (irritable bowel syndrome)    Migraines    Mitral valve prolapse    Rectal prolapse     Past Surgical History:  Procedure Laterality Date   ABDOMINAL HYSTERECTOMY  1999   APPENDECTOMY  1962   BREAST CYST ASPIRATION Bilateral    CHOLECYSTECTOMY N/A 12/17/2018   Procedure: LAPAROSCOPIC CHOLECYSTECTOMY;  Surgeon: Clovis Riley, MD;  Location: [facility];  Service: General;  Laterality: N/A;   EYE SURGERY     JOINT REPLACEMENT     rt knee scope   REFRACTIVE SURGERY  2000   both eyes   TOTAL KNEE ARTHROPLASTY Right 03/24/2015   Procedure: TOTAL KNEE ARTHROPLASTY;  Surgeon: [name], MD;  Location: [facility];  Service: Orthopedics;  Laterality: Right;   TUBAL LIGATION  1977    No current facility-administered medications for this encounter.   Current Outpatient Medications  Medication Sig Dispense Refill Last Dose   acetaminophen (TYLENOL) 500 MG tablet Take 1,000 mg by mouth in the morning, at noon, and at bedtime.      albuterol (VENTOLIN HFA) 108 (90 Base) MCG/ACT inhaler INHALE 2 PUFFS INTO THE LUNGS EVERY 6 HOURS  AS NEEDED FOR WHEEZING OR SHORTNESS OF BREATH 8.5 g 2    aspirin EC 81 MG tablet Take 81 mg by mouth in the morning. Swallow whole.      Biotin 5000 MCG TABS Take 5,000 mcg by mouth in the morning.      Brimonidine Tartrate (LUMIFY) 0.025 % SOLN Place 1 drop into both eyes 2 (two) times daily.      Cholecalciferol (VITAMIN D-3) 125 MCG (5000 UT) TABS Take 5,000 Units by mouth in the morning.      CRANBERRY PO Take 15,000 mg by mouth in the morning.       Cyanocobalamin (VITAMIN B-12) 5000 MCG TBDP Take 5,000 mcg by mouth in the morning.      docusate sodium (COLACE) 250 MG capsule Take 1,000 mg by mouth at bedtime.      estradiol (ESTRACE) 0.5 MG tablet TAKE 1 TABLET(0.5 MG) BY MOUTH DAILY 90 tablet 2    fluticasone (FLONASE) 50 MCG/ACT nasal spray Place 1 spray into both nostrils daily as needed for allergies or rhinitis.      fluticasone (FLOVENT HFA) 44 MCG/ACT inhaler Inhale 2 puffs into the lungs in the morning and at bedtime. 1 each 12    furosemide (LASIX) 20 MG tablet Take 1 tablet (20 mg total) by mouth daily. (Patient taking differently: Take 20 mg by mouth daily as needed (fluid retention).) 30 tablet 3    Ginkgo Biloba 120 MG CAPS Take 120 mg by mouth in the morning.      hydroxypropyl methylcellulose / hypromellose (ISOPTO TEARS / GONIOVISC) 2.5 % ophthalmic solution Place 1 drop into both eyes 4 (four) times daily as needed for dry eyes.      levothyroxine (SYNTHROID) 112 MCG tablet Take 1 tablet (112 mcg total) by mouth daily. 90 tablet 0    loratadine (CLARITIN) 10 MG tablet Take 10 mg by mouth daily as needed for allergies.      Multiple Vitamins-Minerals (HAIR/SKIN/NAILS/BIOTIN PO) Take 3 tablets by mouth in the morning.      pantoprazole (PROTONIX) 20 MG tablet TAKE 1 TABLET(20 MG) BY MOUTH DAILY 90 tablet 1    Probiotic Product (PROBIOTIC PO) Take 4 capsules by mouth in the morning.      Sennosides 17.2 MG TABS Take 172 mg by mouth at bedtime.      Zinc 50 MG TABS Take 50 mg by mouth in the morning.      APPLE CIDER VINEGAR PO Take 2 tablets by mouth in the morning. gummies      potassium chloride SA (KLOR-CON) 20 MEQ tablet Take potasium with each lasix tablet taken. 30 tablet 3 Not Taking   Allergies  Allergen Reactions   Tetracycline Hives and Itching   Dilaudid [Hydromorphone Hcl] Nausea And Vomiting   Other     Cigarette smoke and perfumes--flares asthma (develops bronchitis)    Social History   Tobacco Use    Smoking status: Former    Types: Cigarettes    Quit date: 03/23/1976    Years since quitting: 44.6   Smokeless tobacco: Never  Substance Use Topics   Alcohol use: No    Family History  Problem Relation Age of Onset   Stomach cancer Maternal Grandmother    Hypertension Maternal Grandmother    Diabetes Maternal Grandmother    Stroke Maternal Grandmother    Cancer Maternal Grandmother        Stomach cancer   Colon cancer Paternal Grandfather    Heart attack Paternal  Grandfather    Hyperlipidemia Mother    Diabetes Mother    Hypertension Mother    Alzheimer's disease Mother    Diabetes Father    Hyperlipidemia Father    Heart disease Father    Heart attack Father    Stroke Paternal Grandmother      Review of Systems  All other systems reviewed and are negative.  Objective:  Physical Exam Patient is alert, oriented, no adenopathy, well-dressed, normal affect, normal respiratory effort. Left knee skin is in good condition no cuts bruises or skin tears.  No effusion no erythema crepitus and pain with range of motion. She has mild soft tissue swelling. Global tenderness to palpation in all three compartments.Varus malalignment. Heart RRR Lungs Clear Vital signs in last 24 hours:    Labs:   Estimated body mass index is 26.61 kg/m as calculated from the following:   Height as of 09/30/20: 5\' 4"  (1.626 m).   Weight as of 09/30/20: 70.3 kg.   Imaging Review Plain radiographs demonstrate severe degenerative joint disease of the left knee(s). The overall alignment issignificant varus. The bone quality appears to be good for age and reported activity level.      Assessment/Plan:  End stage arthritis, left knee   The patient history, physical examination, clinical judgment of the provider and imaging studies are consistent with end stage degenerative joint disease of the left knee(s) and total knee arthroplasty is deemed medically necessary. The treatment options including  medical management, injection therapy arthroscopy and arthroplasty were discussed at length. The risks and benefits of total knee arthroplasty were presented and reviewed. The risks due to aseptic loosening, infection, stiffness, patella tracking problems, thromboembolic complications and other imponderables were discussed. The patient acknowledged the explanation, agreed to proceed with the plan and consent was signed. Patient is being admitted for inpatient treatment for surgery, pain control, PT, OT, prophylactic antibiotics, VTE prophylaxis, progressive ambulation and ADL's and discharge planning. The patient is planning to be discharged home with home health services     Patient's anticipated LOS is less than 2 midnights, meeting these requirements: - Younger than 22 - Lives within 1 hour of care - Has a competent adult at home to recover with post-op recover - NO history of  - Chronic pain requiring opiods  - Diabetes  - Coronary Artery Disease  - Heart failure  - Heart attack  - Stroke  - DVT/VTE  - Cardiac arrhythmia  - Respiratory Failure/COPD  - Renal failure  - Anemia  - Advanced Liver disease

## 2020-11-03 NOTE — Telephone Encounter (Signed)
Ortho bundle pre-op call completed. 

## 2020-11-04 ENCOUNTER — Other Ambulatory Visit: Payer: Self-pay

## 2020-11-04 ENCOUNTER — Encounter (HOSPITAL_COMMUNITY): Payer: Self-pay

## 2020-11-04 ENCOUNTER — Encounter (HOSPITAL_COMMUNITY)
Admission: RE | Admit: 2020-11-04 | Discharge: 2020-11-04 | Disposition: A | Discharge status: Home / Self Care | Payer: PPO | Source: Ambulatory Visit | Attending: Orthopaedic Surgery | Admitting: Orthopaedic Surgery

## 2020-11-04 DIAGNOSIS — Z01812 Encounter for preprocedural laboratory examination: Secondary | ICD-10-CM | POA: Diagnosis not present

## 2020-11-04 HISTORY — DX: Chronic kidney disease, unspecified: N18.9

## 2020-11-04 HISTORY — DX: Myoneural disorder, unspecified: G70.9

## 2020-11-04 HISTORY — DX: Pneumonia, unspecified organism: J18.9

## 2020-11-04 HISTORY — DX: Unspecified asthma, uncomplicated: J45.909

## 2020-11-04 LAB — BASIC METABOLIC PANEL
Anion gap: 8 (ref 5–15)
BUN: 18 mg/dL (ref 8–23)
CO2: 29 mmol/L (ref 22–32)
Calcium: 9.8 mg/dL (ref 8.9–10.3)
Chloride: 107 mmol/L (ref 98–111)
Creatinine, Ser: 0.9 mg/dL (ref 0.44–1.00)
GFR, Estimated: >60 mL/min (ref >60)
Glucose, Bld: 109 mg/dL — ABNORMAL HIGH (ref 70–99)
Potassium: 3.8 mmol/L (ref 3.5–5.1)
Sodium: 144 mmol/L (ref 135–145)

## 2020-11-04 LAB — CBC
HCT: 43.3 % (ref 36.0–46.0)
Hemoglobin: 14.4 g/dL (ref 12.0–15.0)
MCH: 32.1 pg (ref 26.0–34.0)
MCHC: 33.3 g/dL (ref 30.0–36.0)
MCV: 96.4 fL (ref 80.0–100.0)
Platelets: 345 10*3/uL (ref 150–400)
RBC: 4.49 MIL/uL (ref 3.87–5.11)
RDW: 13.9 % (ref 11.5–15.5)
WBC: 7.5 10*3/uL (ref 4.0–10.5)
nRBC: 0 % (ref 0.0–0.2)

## 2020-11-04 LAB — SURGICAL PCR SCREEN
MRSA, PCR: NEGATIVE
Staphylococcus aureus: NEGATIVE

## 2020-11-04 NOTE — Progress Notes (Addendum)
COVID swab appointment: 11/13/20  COVID Vaccine Completed: yes x4 Date COVID Vaccine completed: 03/07/19, 04/05/19 Has received booster: 12/07/19, 08/21/20 COVID vaccine manufacturer: Moderna     Date of COVID positive in last 90 days:  PCP - Abelino Derrick, MD Cardiologist - no  Saw  Daneen Schick for syncope 04-15-19 LOV no loner sees was cleared   Chest x-ray - 05/08/20 Epic EKG -  Stress Test -  ECHO - 05/02/19 Epic Cardiac Cath -  Pacemaker/ICD device last checked: Spinal Cord Stimulator:  Sleep Study -  CPAP -   Fasting Blood Sugar -  Checks Blood Sugar _____ times a day  Blood Thinner Instructions: Aspirin Instructions:   81 mg  Last Dose:  Activity level:  Can go up a flight of stairs and perform activities of daily living without stopping and without symptoms of chest pain or shortness of breath.   Able to exercise without symptoms  Some SOB due to Asthma  Anesthesia review: Asthma  Patient denies shortness of breath, fever, cough and chest pain at PAT appointment   Patient verbalized understanding of instructions that were given to them at the PAT appointment. Patient was also instructed that they will need to review over the PAT instructions again at home before surgery.

## 2020-11-05 ENCOUNTER — Telehealth: Payer: Self-pay | Admitting: *Deleted

## 2020-11-05 ENCOUNTER — Other Ambulatory Visit: Payer: Self-pay | Admitting: *Deleted

## 2020-11-05 DIAGNOSIS — M1712 Unilateral primary osteoarthritis, left knee: Secondary | ICD-10-CM

## 2020-11-05 NOTE — Telephone Encounter (Signed)
Ortho bundle Pre-op call completed. 

## 2020-11-13 ENCOUNTER — Other Ambulatory Visit: Payer: Self-pay | Admitting: Orthopaedic Surgery

## 2020-11-13 LAB — SARS CORONAVIRUS 2 (TAT 6-24 HRS): SARS Coronavirus 2: NEGATIVE

## 2020-11-16 NOTE — Anesthesia Preprocedure Evaluation (Addendum)
Anesthesia Evaluation  Patient identified by MRN, date of birth, ID band Patient awake    Reviewed: Allergy & Precautions, NPO status , Patient's Chart, lab work & pertinent test results  History of Anesthesia Complications (+) PROLONGED EMERGENCE and history of anesthetic complications  Airway Mallampati: II  TM Distance: >3 FB Neck ROM: Full    Dental no notable dental hx. (+) Dental Advisory Given, Teeth Intact   Pulmonary asthma , pneumonia, former smoker,    Pulmonary exam normal breath sounds clear to auscultation       Cardiovascular hypertension (no meds x several months), Normal cardiovascular exam Rhythm:Regular Rate:Normal  Echo 04/2019 1. Left ventricular ejection fraction, by estimation, is 60 to 65%. The left ventricle has normal function. The left ventricle has no regional wall motion abnormalities. Left ventricular diastolic parameters are consistent with Grade I diastolic dysfunction (impaired relaxation).  2. Right ventricular systolic function is normal. The right ventricular size is normal. The estimated right ventricular systolic pressure is 89.1 mmHg.  3. The mitral valve is normal in structure. No evidence of mitral valve regurgitation. No evidence of mitral stenosis.  4. The aortic valve is tricuspid. Aortic valve regurgitation is not visualized. No aortic stenosis is present.  5. The inferior vena cava is normal in size with greater than 50% respiratory variability, suggesting right atrial pressure of 3 mmHg.  6. 2.5 x 2.8 cyst in left lobe of liver.    Neuro/Psych  Headaches, negative psych ROS   GI/Hepatic Neg liver ROS, hiatal hernia, GERD  Medicated and Poorly Controlled, IBS    Endo/Other  Hypothyroidism   Renal/GU CRFRenal disease     Musculoskeletal  (+) Arthritis ,   Abdominal   Peds  Hematology  (+) anemia ,   Anesthesia Other Findings Covid+ 11/01/18    Reproductive/Obstetrics                          Anesthesia Physical  Anesthesia Plan  ASA: 2  Anesthesia Plan: Spinal   Post-op Pain Management:  Regional for Post-op pain   Induction:   PONV Risk Score and Plan: 4 or greater and Ondansetron, Dexamethasone, Propofol infusion, Scopolamine patch - Pre-op, Treatment may vary due to age or medical condition and TIVA  Airway Management Planned: Natural Airway  Additional Equipment: None  Intra-op Plan:   Post-operative Plan:   Informed Consent: I have reviewed the patients History and Physical, chart, labs and discussed the procedure including the risks, benefits and alternatives for the proposed anesthesia with the patient or authorized representative who has indicated his/her understanding and acceptance.     Dental advisory given  Plan Discussed with: CRNA  Anesthesia Plan Comments:      Anesthesia Quick Evaluation

## 2020-11-17 ENCOUNTER — Ambulatory Visit (HOSPITAL_COMMUNITY): Payer: PPO | Admitting: Certified Registered"

## 2020-11-17 ENCOUNTER — Encounter (HOSPITAL_COMMUNITY): Payer: Self-pay | Admitting: Orthopaedic Surgery

## 2020-11-17 ENCOUNTER — Inpatient Hospital Stay (HOSPITAL_COMMUNITY)
Admission: RE | Admit: 2020-11-17 | Discharge: 2020-11-20 | DRG: 470 | Disposition: A | Discharge status: Home Health | Payer: PPO | Attending: Orthopaedic Surgery | Admitting: Orthopaedic Surgery

## 2020-11-17 ENCOUNTER — Other Ambulatory Visit: Payer: Self-pay

## 2020-11-17 ENCOUNTER — Encounter (HOSPITAL_COMMUNITY)
Admission: RE | Disposition: A | Discharge status: Home Health | Payer: Self-pay | Source: Home / Self Care | Attending: Orthopaedic Surgery

## 2020-11-17 DIAGNOSIS — Z800 Family history of malignant neoplasm of digestive organs: Secondary | ICD-10-CM | POA: Diagnosis not present

## 2020-11-17 DIAGNOSIS — Z823 Family history of stroke: Secondary | ICD-10-CM

## 2020-11-17 DIAGNOSIS — Z8249 Family history of ischemic heart disease and other diseases of the circulatory system: Secondary | ICD-10-CM | POA: Diagnosis not present

## 2020-11-17 DIAGNOSIS — Z833 Family history of diabetes mellitus: Secondary | ICD-10-CM

## 2020-11-17 DIAGNOSIS — Z881 Allergy status to other antibiotic agents status: Secondary | ICD-10-CM

## 2020-11-17 DIAGNOSIS — J45909 Unspecified asthma, uncomplicated: Secondary | ICD-10-CM | POA: Diagnosis present

## 2020-11-17 DIAGNOSIS — E876 Hypokalemia: Secondary | ICD-10-CM | POA: Diagnosis present

## 2020-11-17 DIAGNOSIS — Z96652 Presence of left artificial knee joint: Secondary | ICD-10-CM | POA: Diagnosis not present

## 2020-11-17 DIAGNOSIS — M21162 Varus deformity, not elsewhere classified, left knee: Secondary | ICD-10-CM | POA: Diagnosis present

## 2020-11-17 DIAGNOSIS — Z820 Family history of epilepsy and other diseases of the nervous system: Secondary | ICD-10-CM | POA: Diagnosis not present

## 2020-11-17 DIAGNOSIS — Z7982 Long term (current) use of aspirin: Secondary | ICD-10-CM

## 2020-11-17 DIAGNOSIS — E782 Mixed hyperlipidemia: Secondary | ICD-10-CM | POA: Diagnosis present

## 2020-11-17 DIAGNOSIS — Z96651 Presence of right artificial knee joint: Secondary | ICD-10-CM | POA: Diagnosis present

## 2020-11-17 DIAGNOSIS — Z79899 Other long term (current) drug therapy: Secondary | ICD-10-CM | POA: Diagnosis not present

## 2020-11-17 DIAGNOSIS — M1712 Unilateral primary osteoarthritis, left knee: Secondary | ICD-10-CM

## 2020-11-17 DIAGNOSIS — G8918 Other acute postprocedural pain: Secondary | ICD-10-CM | POA: Diagnosis not present

## 2020-11-17 DIAGNOSIS — Z87891 Personal history of nicotine dependence: Secondary | ICD-10-CM | POA: Diagnosis not present

## 2020-11-17 DIAGNOSIS — Z885 Allergy status to narcotic agent status: Secondary | ICD-10-CM

## 2020-11-17 DIAGNOSIS — E039 Hypothyroidism, unspecified: Secondary | ICD-10-CM | POA: Diagnosis present

## 2020-11-17 DIAGNOSIS — Z8349 Family history of other endocrine, nutritional and metabolic diseases: Secondary | ICD-10-CM

## 2020-11-17 DIAGNOSIS — Z90710 Acquired absence of both cervix and uterus: Secondary | ICD-10-CM | POA: Diagnosis not present

## 2020-11-17 DIAGNOSIS — Z8616 Personal history of COVID-19: Secondary | ICD-10-CM | POA: Diagnosis not present

## 2020-11-17 DIAGNOSIS — Z79890 Hormone replacement therapy (postmenopausal): Secondary | ICD-10-CM | POA: Diagnosis not present

## 2020-11-17 DIAGNOSIS — K449 Diaphragmatic hernia without obstruction or gangrene: Secondary | ICD-10-CM | POA: Diagnosis not present

## 2020-11-17 HISTORY — PX: TOTAL KNEE ARTHROPLASTY: SHX125

## 2020-11-17 LAB — CBC
HCT: 43.5 % (ref 36.0–46.0)
Hemoglobin: 13.5 g/dL (ref 12.0–15.0)
MCH: 32.5 pg (ref 26.0–34.0)
MCHC: 31 g/dL (ref 30.0–36.0)
MCV: 104.6 fL — ABNORMAL HIGH (ref 80.0–100.0)
Platelets: 317 10*3/uL (ref 150–400)
RBC: 4.16 MIL/uL (ref 3.87–5.11)
RDW: 13.6 % (ref 11.5–15.5)
WBC: 7.9 10*3/uL (ref 4.0–10.5)
nRBC: 0 % (ref 0.0–0.2)

## 2020-11-17 SURGERY — ARTHROPLASTY, KNEE, TOTAL
Anesthesia: Spinal | Site: Knee | Laterality: Left

## 2020-11-17 MED ORDER — DEXAMETHASONE SODIUM PHOSPHATE 10 MG/ML IJ SOLN
INTRAMUSCULAR | Status: AC
  Filled 2020-11-17: qty 1

## 2020-11-17 MED ORDER — BUPIVACAINE-EPINEPHRINE 0.5% -1:200000 IJ SOLN
INTRAMUSCULAR | Status: DC | PRN
  Administered 2020-11-17: 30 mL

## 2020-11-17 MED ORDER — SODIUM CHLORIDE 0.9 % IV SOLN
75.0000 mL/h | INTRAVENOUS | Status: DC
  Administered 2020-11-17: 75 mL/h via INTRAVENOUS

## 2020-11-17 MED ORDER — PHENYLEPHRINE 40 MCG/ML (10ML) SYRINGE FOR IV PUSH (FOR BLOOD PRESSURE SUPPORT)
PREFILLED_SYRINGE | INTRAVENOUS | Status: DC | PRN
Start: 2020-11-17 — End: 2020-11-17
  Administered 2020-11-17: 80 ug via INTRAVENOUS

## 2020-11-17 MED ORDER — BUPIVACAINE IN DEXTROSE 0.75-8.25 % IT SOLN
INTRATHECAL | Status: DC | PRN
  Administered 2020-11-17: 2 mL via INTRATHECAL

## 2020-11-17 MED ORDER — CLONIDINE HCL (ANALGESIA) 100 MCG/ML EP SOLN
EPIDURAL | Status: DC | PRN
  Administered 2020-11-17: 80 ug

## 2020-11-17 MED ORDER — PHENYLEPHRINE 40 MCG/ML (10ML) SYRINGE FOR IV PUSH (FOR BLOOD PRESSURE SUPPORT)
PREFILLED_SYRINGE | INTRAVENOUS | Status: AC
  Filled 2020-11-17: qty 10

## 2020-11-17 MED ORDER — MAGNESIUM HYDROXIDE 400 MG/5ML PO SUSP: 30.0000 mL | Freq: Every day | ORAL | Status: DC | PRN

## 2020-11-17 MED ORDER — ESTRADIOL 1 MG PO TABS
0.5000 mg | ORAL_TABLET | Freq: Every day | ORAL | Status: DC
  Administered 2020-11-18 – 2020-11-20 (×3): 0.5 mg via ORAL
  Filled 2020-11-17 (×3): qty 0.5

## 2020-11-17 MED ORDER — PROPOFOL 1000 MG/100ML IV EMUL
INTRAVENOUS | Status: AC
  Filled 2020-11-17: qty 100

## 2020-11-17 MED ORDER — TRANEXAMIC ACID-NACL 1000-0.7 MG/100ML-% IV SOLN
1000.0000 mg | Freq: Once | INTRAVENOUS | Status: AC
Start: 2020-11-17 — End: 2020-11-17
  Administered 2020-11-17: 1000 mg via INTRAVENOUS
  Filled 2020-11-17: qty 100

## 2020-11-17 MED ORDER — PROMETHAZINE HCL 25 MG/ML IJ SOLN: 6.2500 mg | INTRAMUSCULAR | Status: DC | PRN

## 2020-11-17 MED ORDER — SCOPOLAMINE 1 MG/3DAYS TD PT72
MEDICATED_PATCH | TRANSDERMAL | Status: DC | PRN
  Administered 2020-11-17: 1 via TRANSDERMAL

## 2020-11-17 MED ORDER — CEFAZOLIN SODIUM-DEXTROSE 1-4 GM/50ML-% IV SOLN
1.0000 g | Freq: Four times a day (QID) | INTRAVENOUS | Status: AC
  Administered 2020-11-17 (×2): 1 g via INTRAVENOUS
  Filled 2020-11-17 (×2): qty 50

## 2020-11-17 MED ORDER — SENNA 8.6 MG PO TABS
17.2000 mg | ORAL_TABLET | Freq: Every day | ORAL | Status: DC
  Administered 2020-11-17 – 2020-11-20 (×3): 17.2 mg via ORAL
  Filled 2020-11-17 (×3): qty 2

## 2020-11-17 MED ORDER — ALUM & MAG HYDROXIDE-SIMETH 200-200-20 MG/5ML PO SUSP: 30.0000 mL | ORAL | Status: DC | PRN

## 2020-11-17 MED ORDER — ALBUTEROL SULFATE (2.5 MG/3ML) 0.083% IN NEBU: 2.5000 mg | INHALATION_SOLUTION | Freq: Four times a day (QID) | RESPIRATORY_TRACT | Status: DC | PRN

## 2020-11-17 MED ORDER — DOCUSATE SODIUM 100 MG PO CAPS: 1000.0000 mg | ORAL_CAPSULE | Freq: Every day | ORAL | Status: DC | PRN

## 2020-11-17 MED ORDER — METOCLOPRAMIDE HCL 5 MG PO TABS: 5.0000 mg | ORAL_TABLET | Freq: Three times a day (TID) | ORAL | Status: DC | PRN

## 2020-11-17 MED ORDER — ACETAMINOPHEN 500 MG PO TABS
1000.0000 mg | ORAL_TABLET | Freq: Once | ORAL | Status: AC
  Administered 2020-11-17: 1000 mg via ORAL
  Filled 2020-11-17: qty 2

## 2020-11-17 MED ORDER — LIDOCAINE 2% (20 MG/ML) 5 ML SYRINGE
INTRAMUSCULAR | Status: DC | PRN
  Administered 2020-11-17: 50 mg via INTRAVENOUS

## 2020-11-17 MED ORDER — SCOPOLAMINE 1 MG/3DAYS TD PT72
MEDICATED_PATCH | TRANSDERMAL | Status: AC
  Filled 2020-11-17: qty 1

## 2020-11-17 MED ORDER — ONDANSETRON HCL 4 MG/2ML IJ SOLN
INTRAMUSCULAR | Status: DC | PRN
  Administered 2020-11-17: 4 mg via INTRAVENOUS

## 2020-11-17 MED ORDER — ASPIRIN 81 MG PO CHEW
81.0000 mg | CHEWABLE_TABLET | Freq: Two times a day (BID) | ORAL | Status: DC
Start: 2020-11-17 — End: 2020-11-20
  Administered 2020-11-17 – 2020-11-20 (×6): 81 mg via ORAL
  Filled 2020-11-17 (×6): qty 1

## 2020-11-17 MED ORDER — METHOCARBAMOL 500 MG PO TABS
500.0000 mg | ORAL_TABLET | Freq: Four times a day (QID) | ORAL | Status: DC | PRN
  Administered 2020-11-17 – 2020-11-19 (×7): 500 mg via ORAL
  Filled 2020-11-17 (×7): qty 1

## 2020-11-17 MED ORDER — ASPIRIN EC 81 MG PO TBEC: 81.0000 mg | DELAYED_RELEASE_TABLET | Freq: Every morning | ORAL | Status: DC

## 2020-11-17 MED ORDER — FLUTICASONE PROPIONATE 50 MCG/ACT NA SUSP
1.0000 | Freq: Every day | NASAL | Status: DC | PRN
  Filled 2020-11-17: qty 16

## 2020-11-17 MED ORDER — PROPOFOL 500 MG/50ML IV EMUL
INTRAVENOUS | Status: DC | PRN
  Administered 2020-11-17: 100 ug/kg/min via INTRAVENOUS

## 2020-11-17 MED ORDER — LEVOTHYROXINE SODIUM 112 MCG PO TABS
112.0000 ug | ORAL_TABLET | Freq: Every day | ORAL | Status: DC
  Administered 2020-11-18 – 2020-11-20 (×3): 112 ug via ORAL
  Filled 2020-11-17 (×3): qty 1

## 2020-11-17 MED ORDER — ONDANSETRON HCL 4 MG PO TABS: 4.0000 mg | ORAL_TABLET | Freq: Four times a day (QID) | ORAL | Status: DC | PRN

## 2020-11-17 MED ORDER — PHENYLEPHRINE HCL (PRESSORS) 10 MG/ML IV SOLN
INTRAVENOUS | Status: AC
  Filled 2020-11-17: qty 2

## 2020-11-17 MED ORDER — MENTHOL 3 MG MT LOZG: 1.0000 | LOZENGE | OROMUCOSAL | Status: DC | PRN

## 2020-11-17 MED ORDER — METHOCARBAMOL 500 MG IVPB - SIMPLE MED
500.0000 mg | Freq: Four times a day (QID) | INTRAVENOUS | Status: DC | PRN
  Filled 2020-11-17: qty 50

## 2020-11-17 MED ORDER — PROPOFOL 10 MG/ML IV BOLUS
INTRAVENOUS | Status: DC | PRN
  Administered 2020-11-17 (×2): 20 mg via INTRAVENOUS

## 2020-11-17 MED ORDER — FENTANYL CITRATE (PF) 100 MCG/2ML IJ SOLN
INTRAMUSCULAR | Status: DC | PRN
  Administered 2020-11-17: 50 ug via INTRAVENOUS
  Administered 2020-11-17 (×2): 25 ug via INTRAVENOUS

## 2020-11-17 MED ORDER — FLEET ENEMA 7-19 GM/118ML RE ENEM: 1.0000 | ENEMA | Freq: Once | RECTAL | Status: DC | PRN

## 2020-11-17 MED ORDER — DEXAMETHASONE SODIUM PHOSPHATE 10 MG/ML IJ SOLN
INTRAMUSCULAR | Status: DC | PRN
  Administered 2020-11-17: 5 mg via INTRAVENOUS

## 2020-11-17 MED ORDER — ORAL CARE MOUTH RINSE: 15.0000 mL | Freq: Once | OROMUCOSAL | Status: AC

## 2020-11-17 MED ORDER — ACETAMINOPHEN 325 MG PO TABS
650.0000 mg | ORAL_TABLET | Freq: Four times a day (QID) | ORAL | Status: AC
  Administered 2020-11-17 – 2020-11-18 (×4): 650 mg via ORAL
  Filled 2020-11-17 (×4): qty 2

## 2020-11-17 MED ORDER — DOCUSATE SODIUM 100 MG PO CAPS
100.0000 mg | ORAL_CAPSULE | Freq: Two times a day (BID) | ORAL | Status: DC
  Administered 2020-11-17 – 2020-11-20 (×6): 100 mg via ORAL
  Filled 2020-11-17 (×6): qty 1

## 2020-11-17 MED ORDER — FENTANYL CITRATE (PF) 100 MCG/2ML IJ SOLN
INTRAMUSCULAR | Status: AC
  Filled 2020-11-17: qty 2

## 2020-11-17 MED ORDER — OXYCODONE HCL 5 MG PO TABS
10.0000 mg | ORAL_TABLET | ORAL | Status: DC | PRN
  Administered 2020-11-17 (×3): 10 mg via ORAL
  Administered 2020-11-18 – 2020-11-19 (×8): 15 mg via ORAL
  Administered 2020-11-20: 10 mg via ORAL
  Filled 2020-11-17 (×3): qty 3
  Filled 2020-11-17 (×2): qty 2
  Filled 2020-11-17 (×4): qty 3
  Filled 2020-11-17: qty 2
  Filled 2020-11-17: qty 3

## 2020-11-17 MED ORDER — LACTATED RINGERS IV SOLN: INTRAVENOUS | Status: DC

## 2020-11-17 MED ORDER — OXYCODONE HCL 5 MG PO TABS
5.0000 mg | ORAL_TABLET | ORAL | Status: DC | PRN
  Administered 2020-11-18 – 2020-11-20 (×3): 10 mg via ORAL
  Filled 2020-11-17 (×4): qty 2

## 2020-11-17 MED ORDER — FUROSEMIDE 20 MG PO TABS: 20.0000 mg | ORAL_TABLET | Freq: Every day | ORAL | Status: DC | PRN

## 2020-11-17 MED ORDER — PANTOPRAZOLE SODIUM 20 MG PO TBEC
20.0000 mg | DELAYED_RELEASE_TABLET | Freq: Every day | ORAL | Status: DC
  Administered 2020-11-18 – 2020-11-20 (×3): 20 mg via ORAL
  Filled 2020-11-17 (×3): qty 1

## 2020-11-17 MED ORDER — BUPIVACAINE-EPINEPHRINE (PF) 0.5% -1:200000 IJ SOLN
INTRAMUSCULAR | Status: AC
  Filled 2020-11-17: qty 30

## 2020-11-17 MED ORDER — FENTANYL CITRATE PF 50 MCG/ML IJ SOSY: 25.0000 ug | PREFILLED_SYRINGE | INTRAMUSCULAR | Status: DC | PRN

## 2020-11-17 MED ORDER — ONDANSETRON HCL 4 MG/2ML IJ SOLN
INTRAMUSCULAR | Status: AC
  Filled 2020-11-17: qty 2

## 2020-11-17 MED ORDER — CHLORHEXIDINE GLUCONATE 0.12 % MT SOLN
15.0000 mL | Freq: Once | OROMUCOSAL | Status: AC
  Administered 2020-11-17: 15 mL via OROMUCOSAL

## 2020-11-17 MED ORDER — ROPIVACAINE HCL 5 MG/ML IJ SOLN
INTRAMUSCULAR | Status: DC | PRN
  Administered 2020-11-17: 30 mL via PERINEURAL

## 2020-11-17 MED ORDER — PHENYLEPHRINE HCL-NACL 20-0.9 MG/250ML-% IV SOLN
INTRAVENOUS | Status: DC | PRN
  Administered 2020-11-17: 30 ug/min via INTRAVENOUS

## 2020-11-17 MED ORDER — ONDANSETRON HCL 4 MG/2ML IJ SOLN
4.0000 mg | Freq: Four times a day (QID) | INTRAMUSCULAR | Status: DC | PRN
  Administered 2020-11-18 – 2020-11-20 (×2): 4 mg via INTRAVENOUS
  Filled 2020-11-17 (×2): qty 2

## 2020-11-17 MED ORDER — SODIUM CHLORIDE 0.9 % IR SOLN
Status: DC | PRN
  Administered 2020-11-17: 3000 mL

## 2020-11-17 MED ORDER — LORATADINE 10 MG PO TABS: 10.0000 mg | ORAL_TABLET | Freq: Every day | ORAL | Status: DC | PRN

## 2020-11-17 MED ORDER — BRIMONIDINE TARTRATE 0.2 % OP SOLN
1.0000 [drp] | Freq: Two times a day (BID) | OPHTHALMIC | Status: DC
  Administered 2020-11-18: 1 [drp] via OPHTHALMIC
  Filled 2020-11-17: qty 5

## 2020-11-17 MED ORDER — BUDESONIDE 0.25 MG/2ML IN SUSP
0.2500 mg | Freq: Two times a day (BID) | RESPIRATORY_TRACT | Status: DC
  Administered 2020-11-17 – 2020-11-20 (×6): 0.25 mg via RESPIRATORY_TRACT
  Filled 2020-11-17 (×6): qty 2

## 2020-11-17 MED ORDER — PHENOL 1.4 % MT LIQD: 1.0000 | OROMUCOSAL | Status: DC | PRN

## 2020-11-17 MED ORDER — TRANEXAMIC ACID-NACL 1000-0.7 MG/100ML-% IV SOLN
1000.0000 mg | INTRAVENOUS | Status: AC
  Administered 2020-11-17: 1000 mg via INTRAVENOUS
  Filled 2020-11-17: qty 100

## 2020-11-17 MED ORDER — METOCLOPRAMIDE HCL 5 MG/ML IJ SOLN: 5.0000 mg | Freq: Three times a day (TID) | INTRAMUSCULAR | Status: DC | PRN

## 2020-11-17 MED ORDER — POLYVINYL ALCOHOL 1.4 % OP SOLN
1.0000 [drp] | Freq: Four times a day (QID) | OPHTHALMIC | Status: DC | PRN
  Filled 2020-11-17: qty 15

## 2020-11-17 MED ORDER — 0.9 % SODIUM CHLORIDE (POUR BTL) OPTIME
TOPICAL | Status: DC | PRN
  Administered 2020-11-17: 1000 mL

## 2020-11-17 MED ORDER — CELECOXIB 200 MG PO CAPS
200.0000 mg | ORAL_CAPSULE | Freq: Once | ORAL | Status: AC
  Administered 2020-11-17: 200 mg via ORAL
  Filled 2020-11-17: qty 1

## 2020-11-17 MED ORDER — CEFAZOLIN SODIUM-DEXTROSE 2-4 GM/100ML-% IV SOLN
2.0000 g | INTRAVENOUS | Status: AC
Start: 2020-11-17 — End: 2020-11-17
  Administered 2020-11-17: 2 g via INTRAVENOUS
  Filled 2020-11-17: qty 100

## 2020-11-17 MED ORDER — LIDOCAINE HCL (PF) 2 % IJ SOLN
INTRAMUSCULAR | Status: AC
  Filled 2020-11-17: qty 5

## 2020-11-17 MED ORDER — BISACODYL 10 MG RE SUPP: 10.0000 mg | Freq: Every day | RECTAL | Status: DC | PRN

## 2020-11-17 MED ORDER — DEXAMETHASONE SODIUM PHOSPHATE 4 MG/ML IJ SOLN
INTRAMUSCULAR | Status: DC | PRN
  Administered 2020-11-17: 5 mg via PERINEURAL

## 2020-11-17 SURGICAL SUPPLY — 58 items
BAG COUNTER SPONGE SURGICOUNT (BAG) IMPLANT
BAG DECANTER FOR FLEXI CONT (MISCELLANEOUS) ×2 IMPLANT
BAG SPEC THK2 15X12 ZIP CLS (MISCELLANEOUS) ×1
BAG SPNG CNTER NS LX DISP (BAG)
BAG ZIPLOCK 12X15 (MISCELLANEOUS) ×2 IMPLANT
BLADE SAGITTAL 25.0X1.19X90 (BLADE) ×2 IMPLANT
BNDG CMPR 82X61 PLY HI ABS (GAUZE/BANDAGES/DRESSINGS) ×1
BNDG CONFORM 6X.82 1P STRL (GAUZE/BANDAGES/DRESSINGS) ×1 IMPLANT
BOWL SMART MIX CTS (DISPOSABLE) ×2 IMPLANT
CEMENT HV SMART SET (Cement) ×4 IMPLANT
CEMENT TIBIA MBT (Knees) IMPLANT
COMP FEM CEM STD+ LT LCS (Orthopedic Implant) ×2 IMPLANT
COMP PATELLA PEGX3 CEM STAN+ (Knees) ×2 IMPLANT
COMPONENT FEM CEM STD+ LT LCS (Orthopedic Implant) IMPLANT
COMPONENT PTLA PEGX3 CEM STAN+ (Knees) IMPLANT
COVER SURGICAL LIGHT HANDLE (MISCELLANEOUS) ×2 IMPLANT
CUFF TOURN SGL QUICK 34 (TOURNIQUET CUFF) ×2
CUFF TRNQT CYL 34X4.125X (TOURNIQUET CUFF) ×1 IMPLANT
DECANTER SPIKE VIAL GLASS SM (MISCELLANEOUS) ×2 IMPLANT
DRAPE IMP U-DRAPE 54X76 (DRAPES) ×2 IMPLANT
DRAPE INCISE IOBAN 66X45 STRL (DRAPES) ×6 IMPLANT
DRAPE ORTHO SPLIT 77X108 STRL (DRAPES)
DRAPE SHEET LG 3/4 BI-LAMINATE (DRAPES) ×4 IMPLANT
DRAPE SURG ORHT 6 SPLT 77X108 (DRAPES) IMPLANT
DRSG ADAPTIC 3X8 NADH LF (GAUZE/BANDAGES/DRESSINGS) ×2 IMPLANT
DRSG PAD ABDOMINAL 8X10 ST (GAUZE/BANDAGES/DRESSINGS) ×2 IMPLANT
DURAPREP 26ML APPLICATOR (WOUND CARE) ×4 IMPLANT
ELECT REM PT RETURN 15FT ADLT (MISCELLANEOUS) ×2 IMPLANT
GAUZE SPONGE 4X4 12PLY STRL (GAUZE/BANDAGES/DRESSINGS) ×2 IMPLANT
GLOVE SRG 8 PF TXTR STRL LF DI (GLOVE) ×1 IMPLANT
GLOVE SURG NEOPR MICRO LF 8.5 (GLOVE) ×4 IMPLANT
GLOVE SURG NEOPR MICRO LF SZ8 (GLOVE) ×4 IMPLANT
GLOVE SURG UNDER POLY LF SZ8 (GLOVE) ×2
GLOVE SURG UNDER POLY LF SZ8.5 (GLOVE) ×2 IMPLANT
GOWN STRL REUS W/ TWL LRG LVL3 (GOWN DISPOSABLE) ×1 IMPLANT
GOWN STRL REUS W/TWL 2XL LVL3 (GOWN DISPOSABLE) ×2 IMPLANT
GOWN STRL REUS W/TWL LRG LVL3 (GOWN DISPOSABLE) ×2
HANDPIECE INTERPULSE COAX TIP (DISPOSABLE) ×2
HOLDER FOLEY CATH W/STRAP (MISCELLANEOUS) IMPLANT
INSERT TIB LCS RP STD+ 12.5 (Knees) ×1 IMPLANT
KIT TURNOVER KIT A (KITS) IMPLANT
MANIFOLD NEPTUNE II (INSTRUMENTS) ×2 IMPLANT
NS IRRIG 1000ML POUR BTL (IV SOLUTION) ×2 IMPLANT
PACK TOTAL KNEE CUSTOM (KITS) ×2 IMPLANT
PADDING CAST COTTON 6X4 STRL (CAST SUPPLIES) ×4 IMPLANT
PROTECTOR NERVE ULNAR (MISCELLANEOUS) ×2 IMPLANT
SET HNDPC FAN SPRY TIP SCT (DISPOSABLE) ×1 IMPLANT
STAPLER VISISTAT 35W (STAPLE) ×2 IMPLANT
SUT BONE WAX W31G (SUTURE) ×2 IMPLANT
SUT ETHIBOND NAB CT1 #1 30IN (SUTURE) ×4 IMPLANT
SUT MNCRL AB 3-0 PS2 18 (SUTURE) ×2 IMPLANT
SUT VIC AB 2-0 PS2 27 (SUTURE) ×2 IMPLANT
TIBIA MBT CEMENT (Knees) ×2 IMPLANT
TRAY CATH INTERMITTENT SS 16FR (CATHETERS) ×1 IMPLANT
TRAY FOLEY MTR SLVR 16FR STAT (SET/KITS/TRAYS/PACK) ×2 IMPLANT
UNDERPAD 30X36 HEAVY ABSORB (UNDERPADS AND DIAPERS) ×2 IMPLANT
WATER STERILE IRR 1000ML POUR (IV SOLUTION) ×4 IMPLANT
WRAP KNEE MAXI GEL POST OP (GAUZE/BANDAGES/DRESSINGS) ×2 IMPLANT

## 2020-11-17 NOTE — Op Note (Signed)
PATIENT ID:      Megan Valentine  MRN:     [id number] DOB/AGE:    [birth year] / 70 y.o.       OPERATIVE REPORT    DATE OF PROCEDURE:  11/17/2020       PREOPERATIVE DIAGNOSIS: END STAGE  LEFT KNEE OSTEOARTHRITIS                                                       Estimated body mass index is 26.26 kg/m as calculated from the following:   Height as of this encounter: 5\' 4"  (1.626 m).   Weight as of this encounter: 69.4 kg.     POSTOPERATIVE DIAGNOSIS: END STAGE  LEFT KNEE OSTEOARTHRITIS                                                                     Estimated body mass index is 26.26 kg/m as calculated from the following:   Height as of this encounter: 5\' 4"  (1.626 m).   Weight as of this encounter: 69.4 kg.     PROCEDURE:  Procedure(s): LEFT TOTAL KNEE ARTHROPLASTY      SURGEON:  Joni Fears, MD    ASSISTANT:   Biagio Borg, PA-C   (Present and scrubbed throughout the case, critical for assistance with exposure, retraction, instrumentation, and closure.)          ANESTHESIA: regional, spinal, and IV sedation     DRAINS: none :      TOURNIQUET TIME:  Total Tourniquet Time Documented: Thigh (Left) - 88 minutes Total: Thigh (Left) - 88 minutes     COMPLICATIONS:  None   CONDITION:  stable  PROCEDURE IN DETAIL: [id number]   Megan Valentine 11/17/2020, 9:54 AM

## 2020-11-17 NOTE — Op Note (Signed)
NAME: Megan Valentine, Megan Valentine MEDICAL RECORD NO: [id number] ACCOUNT NO: [id number] DATE OF BIRTH: 04-24-1950 FACILITY: [facility]: [facility] PHYSICIAN: Vonna Kotyk. Durward Fortes, MD  Operative Report   DATE OF PROCEDURE: 11/17/2020  PREOPERATIVE DIAGNOSIS:  End-stage osteoarthritis, left knee.  POSTOPERATIVE DIAGNOSIS:  End-stage osteoarthritis, left knee.  PROCEDURE:  Left total knee replacement.  SURGEON:  Vonna Kotyk. Durward Fortes, MD.  ASSISTANT:  Biagio Borg, PA-C  ANESTHESIA:  Spinal with IV sedation and adductor canal block.  COMPLICATIONS:  None.  COMPONENTS:  DePuy LCS standard plus femoral component, #3 rotating keeled tibial tray with a 12.5 mm polyethylene bridging bearing and metal-backed 3 peg rotating patella.  All components were secured with polymethyl methacrylate.  DESCRIPTION OF PROCEDURE:  The patient was met in the holding area, identified the left knee as the appropriate operative site and marked it accordingly.  Any questions were answered.  Anesthesia performed an adductor canal block.  The patient was then  transported to room #4.  She was comfortably placed on the operating table.  Anesthesia performed a spinal without complications.  The patient was then placed securely and comfortably on the operating table.  Left lower extremity was placed in a thigh tourniquet.  The leg was then prepared with chlorhexidine scrub and DuraPrep x2 from the tourniquet to the tips of the toes.  Sterile  draping was performed.  Timeout was called.  The extremity was elevated, Esmarch was applied with a tourniquet at 300 mmHg.  A midline longitudinal incision was made centered about the patella extending from the superior pouch to the tibial tubercle.  Via sharp dissection, the incision was carried down to the subcutaneous tissue.  The first layer of capsule was incised in the  midline.  A medial parapatellar incision was then made with the Bovie.  The joint was entered.  There was minimal  clear yellow joint effusion.  The patient appeared to have some hypermobility of her tissue, so I could easily evert the patella 180 degrees  laterally.  The knee was flexed to 90 degrees.  There was complete absence of articular cartilage in the medial femoral condyle with moderately large osteophytes in both medial and lateral femoral condyle and medial tibial plateau.  The patient had a  varus position, but it was not fixed and I could easily correct it to neutral.  I measured a standard plus femoral component.  The first bony cut was made transversely in the proximal tibia using the external tibial guide.  After each bony cut on the tibia and the femur, I checked my alignment with the external guide.  Subsequent cuts were then made on the femur using the standard plus femoral jig.  Lamina spreaders were then inserted in the medial and lateral compartment.  I removed medial and lateral menisci as well as ACL and PCL.  I used a 3/4 inch curved osteotome  to remove osteophytes from the posterior femoral condyle medially and laterally.  My flexion and extension gaps were symmetrical at 12.5 mm.  Bone was quite hard along the medial femoral condyle and tibial plateau based on the sclerotic areas from her  chronic end-stage osteoarthritis.  Distal femoral valgus cut was made at 4 degrees using the standard plus femoral guide and then the finishing guide applied.  Again, we checked flexion and extension gaps at 12.5 mm.  MCL and LCL remained intact.  Retractor was then placed around the tibia was advanced anteriorly and measured a #3 tibial tray.  This was pinned in  place.  Center hole was then made followed by the keeled cut.  With the tibial jig in place, the 12.5 mm polyethylene bridging bearing  trial was then applied followed by the standard plus femoral component.  The components were then reduced, had full extension and no opening with a varus or valgus stress and flexion well beyond 105 degrees  without any malpositioning of the tibia.  The patella was then prepared by removing 10 mm of bone, leaving 13 mm of patellar thickness.  The patellar jig was applied.  Three holes made and the trial patella was inserted and reduced through a full range of motion that remained stable.  The trial components were removed.  The joint was irrigated with saline solution.  Final components were then impacted with polymethyl methacrylate.  We initially applied the tibial tray followed by the 12.5 mm polyethylene bridging bearing and then the  standard plus femoral component.  The knee was then placed in extension.  We had a nice reduction of the components and any extraneous methacrylate was removed from the periphery of the components.  With the knee left in extension.  The patella was  applied with methacrylate and the patellar clamp.  An approximately 16 minutes of methacrylate had matured.  During this time, we irrigated the joint and then injected the deep capsule with 0.25% Marcaine with epinephrine.  Tourniquet was released at 88  minutes.  Any bleeding was controlled with the Bovie.  The patient did receive a gram of TXA preoperatively as well as 2 grams of Ancef IV.  With a nice dry field, we reirrigated the deep capsule was closed with a running #1 Ethibond.  The superficial capsule with a running 0 Vicryl, the subQ with 3-0 Monocryl and the skin closed with skin clips.  A sterile bulky dressing was applied followed  by the patient support stocking.  The patient tolerated the procedure without complications.   PUS D: 11/17/2020 10:03:21 am T: 11/17/2020 1:00:00 pm  JOB: [id number]/ [id number]

## 2020-11-17 NOTE — Anesthesia Postprocedure Evaluation (Signed)
Anesthesia Post Note  Patient: Megan Valentine  Procedure(s) Performed: LEFT TOTAL KNEE ARTHROPLASTY (Left: Knee)     Patient location during evaluation: PACU Anesthesia Type: Spinal Level of consciousness: awake and alert Pain management: pain level controlled Vital Signs Assessment: post-procedure vital signs reviewed and stable Respiratory status: spontaneous breathing Cardiovascular status: stable Anesthetic complications: no   No notable events documented.  Last Vitals:  Vitals:   11/17/20 1145 11/17/20 1211  BP: 103/66 100/68  Pulse: 65 68  Resp: 16 16  Temp:  36.7 C  SpO2: 99% 99%    Last Pain:  Vitals:   11/17/20 1350  TempSrc:   PainSc: 0-No pain                 Nolon Nations

## 2020-11-17 NOTE — Progress Notes (Deleted)
Orthopedic Tech Progress Note Patient Details:  Megan Valentine December 10, 1950 [id number]  Ortho Devices Type of Ortho Device: CPM padding Ortho Device/Splint Location: RLE Ortho Device/Splint Interventions: Ordered, Application, Adjustment   Post Interventions Patient Tolerated: Well  [name] 11/17/2020, 2:58 PM

## 2020-11-17 NOTE — Anesthesia Procedure Notes (Addendum)
Spinal  Patient location during procedure: OR Start time: 11/17/2020 7:40 AM End time: 11/17/2020 7:45 AM Reason for block: surgical anesthesia Staffing Performed: anesthesiologist  Anesthesiologist: Nolon Nations, MD Preanesthetic Checklist Completed: patient identified, IV checked, site marked, risks and benefits discussed, surgical consent, monitors and equipment checked, pre-op evaluation and timeout performed Spinal Block Patient position: sitting Prep: DuraPrep and site prepped and draped Patient monitoring: heart rate, continuous pulse ox and blood pressure Approach: right paramedian Location: L4-5 Injection technique: single-shot Needle Needle type: Spinocan  Needle gauge: 25 G Needle length: 9 cm Additional Notes Expiration date of kit checked and confirmed. Patient tolerated procedure well, without complications.

## 2020-11-17 NOTE — Anesthesia Procedure Notes (Addendum)
Anesthesia Regional Block: Adductor canal block   Pre-Anesthetic Checklist: , timeout performed,  Correct Patient, Correct Site, Correct Laterality,  Correct Procedure, Correct Position, site marked,  Risks and benefits discussed,  Surgical consent,  Pre-op evaluation,  At surgeon's request and post-op pain management  Laterality: Lower and Left  Prep: chloraprep       Needles:  Injection technique: Single-shot  Needle Type: Stimiplex     Needle Length: 9cm  Needle Gauge: 21     Additional Needles:   Procedures:,,,, ultrasound used (permanent image in chart),,    Narrative:  Start time: 11/17/2020 7:00 AM End time: 11/17/2020 7:15 AM Injection made incrementally with aspirations every 5 mL.  Performed by: Personally  Anesthesiologist: Nolon Nations, MD  Additional Notes: BP cuff, EKG monitors applied. Sedation begun. Artery and nerve location verified with ultrasound. Anesthetic injected incrementally (72ml), slowly, and after negative aspirations under direct u/s guidance. Good fascial/perineural spread. Tolerated well.

## 2020-11-17 NOTE — Transfer of Care (Signed)
Immediate Anesthesia Transfer of Care Note  Patient: Megan Valentine  Procedure(s) Performed: LEFT TOTAL KNEE ARTHROPLASTY (Left: Knee)  Patient Location: PACU  Anesthesia Type:Spinal  Level of Consciousness: awake, alert , oriented and patient cooperative  Airway & Oxygen Therapy: Patient Spontanous Breathing and Patient connected to face mask  Post-op Assessment: Report given to RN and Post -op Vital signs reviewed and stable  Post vital signs: Reviewed and stable  Last Vitals:  Vitals Value Taken Time  BP 95/62 11/17/20 1015  Temp    Pulse 77 11/17/20 1015  Resp 14 11/17/20 1015  SpO2 97 % 11/17/20 1015  Vitals shown include unvalidated device data.  Last Pain:  Vitals:   11/17/20 0622  TempSrc: Oral  PainSc:       Patients Stated Pain Goal: 4 (01/77/93 9030)  Complications: No notable events documented.

## 2020-11-17 NOTE — H&P (Signed)
The recent History & Physical has been reviewed. I have personally examined the patient today. There is no interval change to the documented History & Physical. The patient would like to proceed with the procedure.  [name] 11/17/2020,  7:03 AM

## 2020-11-17 NOTE — Progress Notes (Signed)
Orthopedic Tech Progress Note Patient Details:  Megan Valentine June 28, 1950 [id number]  Ortho Devices Type of Ortho Device: CPM padding Ortho Device/Splint Location: LLE Ortho Device/Splint Interventions: Ordered, Application, Adjustment   Post Interventions Patient Tolerated: Well Instructions Provided: Adjustment of device  [place] [facility] 11/17/2020, 2:59 PM

## 2020-11-17 NOTE — Evaluation (Signed)
Physical Therapy Evaluation Patient Details Name: Megan Valentine MRN: [id number] DOB: 08/21/1950 Today's Date: 11/17/2020  History of Present Illness  70 y.o. female admitted 11/17/20 for L TKA. PMH includes [name] 2017, hysterectomy, IBS, migraines, mitral valve prolapse, rectocele repair.  Clinical Impression  Pt is s/p TKA resulting in the deficits listed below (see PT Problem List).  Min assist for stand pivot transfer to bedside commode, then to recliner. Pt unable to ambulate 2* significant buckling of LLE in standing. Initiated TKA HEP. Good progress expected once anesthesia has fully worn off.  Pt will benefit from skilled PT to increase their independence and safety with mobility to allow discharge to the venue listed below.         Recommendations for follow up therapy are one component of a multi-disciplinary discharge planning process, led by the attending physician.  Recommendations may be updated based on patient status, additional functional criteria and insurance authorization.  Follow Up Recommendations Follow physician's recommendations for discharge plan and follow up therapies    Assistance Recommended at Discharge Intermittent Supervision/Assistance  Functional Status Assessment Patient has not had a recent decline in their functional status  Equipment Recommendations  None recommended by PT    Recommendations for Other Services       Precautions / Restrictions Precautions Precautions: Knee Precaution Comments: reviewed no pillow under knee Restrictions Weight Bearing Restrictions: No Other Position/Activity Restrictions: WBAT      Mobility  Bed Mobility Overal bed mobility: Needs Assistance Bed Mobility: Supine to Sit     Supine to sit: Min assist     General bed mobility comments: min A for LLE OOB    Transfers Overall transfer level: Needs assistance Equipment used: Rolling walker (2 wheels) Transfers: Sit to/from [employer] Sit to Stand: Min assist Stand pivot transfers: Min assist         General transfer comment: VCs for hand placement, min A to power up and to steady, LLE buckling in standing so pivoted on RLE with RW to bedside commode then to recliner, pt incontinent of urine in standing, assisted her to don her pull ups    Ambulation/Gait             General Gait Details: deferred 2* LLE buckling  Stairs            Wheelchair Mobility    Modified Rankin (Stroke Patients Only)       Balance Overall balance assessment: Needs assistance   Sitting balance-Leahy Scale: Good     Standing balance support: Bilateral upper extremity supported Standing balance-Leahy Scale: Poor Standing balance comment: relies on BUE support 2* buckling of LLE in standing                             Pertinent Vitals/Pain Pain Assessment: 0-10 Pain Score: 1  Pain Location: L knee Pain Descriptors / Indicators: Dull Pain Intervention(s): Limited activity within patient's tolerance;Monitored during session;Repositioned;Ice applied    Home Living Family/patient expects to be discharged to:: Private residence Living Arrangements: Alone Available Help at Discharge: Friend(s);Available 24 hours/day Type of Home: House Home Access: Ramped entrance       Home Layout: One level (1 step to get into bathroom, grab bars on either side) Home Equipment: Rolling Walker (2 wheels);Shower seat;Grab bars - tub/shower;BSC;Cane - single point Additional Comments: friend and boyfriend will assist at home    Prior Function Prior Level of Function :  Independent/Modified Independent             Mobility Comments: walked without AD, no falls in past 6 months       Hand Dominance        Extremity/Trunk Assessment   Upper Extremity Assessment Upper Extremity Assessment: Overall WFL for tasks assessed    Lower Extremity Assessment Lower Extremity Assessment: LLE  deficits/detail LLE Deficits / Details: SLR 2/5, knee ext +2/5, ankle WNL LLE Sensation: WNL LLE Coordination: decreased gross motor (likely due to spinal not fully worn off)    Cervical / Trunk Assessment Cervical / Trunk Assessment: Normal  Communication   Communication: No difficulties  Cognition Arousal/Alertness: Awake/alert Behavior During Therapy: WFL for tasks assessed/performed Overall Cognitive Status: Within Functional Limits for tasks assessed                                          General Comments      Exercises Total Joint Exercises Ankle Circles/Pumps: AROM;Both;15 reps;Supine Quad Sets: AROM;Both;5 reps;Supine Heel Slides: AAROM;Left;10 reps;Supine Goniometric ROM: 5-40* AAROM L knee   Assessment/Plan    PT Assessment Patient needs continued PT services  PT Problem List Decreased strength;Decreased mobility;Decreased range of motion;Pain       PT Treatment Interventions DME instruction;Therapeutic activities;Gait training;Therapeutic exercise;Stair training;Balance training;Patient/family education;Functional mobility training    PT Goals (Current goals can be found in the Care Plan section)  Acute Rehab PT Goals Patient Stated Goal: to walk for exercise PT Goal Formulation: With patient Time For Goal Achievement: 11/24/20 Potential to Achieve Goals: Good    Frequency 7X/week   Barriers to discharge        Co-evaluation               AM-PAC PT "6 Clicks" Mobility  Outcome Measure Help needed turning from your back to your side while in a flat bed without using bedrails?: A Little Help needed moving from lying on your back to sitting on the side of a flat bed without using bedrails?: A Little Help needed moving to and from a bed to a chair (including a wheelchair)?: A Little Help needed standing up from a chair using your arms (e.g., wheelchair or bedside chair)?: A Little Help needed to walk in hospital room?: A  Lot Help needed climbing 3-5 steps with a railing? : A Lot 6 Click Score: 16    End of Session Equipment Utilized During Treatment: Gait belt Activity Tolerance: Patient tolerated treatment well Patient left: in chair;with call bell/phone within reach;with chair alarm set Nurse Communication: Mobility status PT Visit Diagnosis: Difficulty in walking, not elsewhere classified (R26.2);Pain;Muscle weakness (generalized) (M62.81) Pain - Right/Left: Left Pain - part of body: Knee    Time: 5465-0354 PT Time Calculation (min) (ACUTE ONLY): 32 min   Charges:   PT Evaluation $PT Eval Moderate Complexity: 1 Mod PT Treatments $Therapeutic Activity: 8-22 mins       Blondell Reveal Kistler PT 11/17/2020  Acute Rehabilitation Services Pager [phone] Office [phone]

## 2020-11-18 ENCOUNTER — Telehealth: Payer: Self-pay

## 2020-11-18 LAB — BASIC METABOLIC PANEL
Anion gap: 6 (ref 5–15)
BUN: 13 mg/dL (ref 8–23)
CO2: 21 mmol/L — ABNORMAL LOW (ref 22–32)
Calcium: 7.9 mg/dL — ABNORMAL LOW (ref 8.9–10.3)
Chloride: 109 mmol/L (ref 98–111)
Creatinine, Ser: 0.88 mg/dL (ref 0.44–1.00)
GFR, Estimated: >60 mL/min (ref >60)
Glucose, Bld: 98 mg/dL (ref 70–99)
Potassium: 3.4 mmol/L — ABNORMAL LOW (ref 3.5–5.1)
Sodium: 136 mmol/L (ref 135–145)

## 2020-11-18 MED ORDER — SODIUM CHLORIDE 0.9 % IV BOLUS
500.0000 mL | Freq: Once | INTRAVENOUS | Status: AC
  Administered 2020-11-18: 500 mL via INTRAVENOUS

## 2020-11-18 NOTE — Plan of Care (Signed)

## 2020-11-18 NOTE — Op Note (Signed)
PATIENT ID: Megan Valentine        MRN:  [id number]          DOB/AGE: [birth year] / 70 y.o.    Joni Fears, MD   Biagio Borg, PA-C [address] [place], [place]  [address]                             [phone]   PROGRESS NOTE  Subjective:  negative for Chest Pain  negative for Shortness of Breath  negative for Nausea/Vomiting   negative for Calf Pain    Tolerating Diet: yes         Patient reports pain as moderate.      Comfortable with oxycodone  Objective: Vital signs in last 24 hours:   Patient Vitals for the past 24 hrs:  BP Temp Temp src Pulse Resp SpO2  11/18/20 0712 92/61 98.4 F (36.9 C) Oral 68 17 99 %  11/18/20 0543 (!) 84/47 98.6 F (37 C) Oral 71 16 99 %  11/18/20 0205 (!) 89/53 98.2 F (36.8 C) Oral 67 16 100 %  11/17/20 2344 93/64 98.3 F (36.8 C) Oral 63 17 97 %  11/17/20 2158 (!) 88/53 98.3 F (36.8 C) Oral 62 16 96 %  11/17/20 2009 -- -- -- -- -- 97 %  11/17/20 1735 93/61 -- -- 61 18 98 %  11/17/20 1404 114/77 -- -- 79 18 99 %  11/17/20 1211 100/68 98 F (36.7 C) Oral 68 16 99 %  11/17/20 1145 103/66 -- -- 65 16 99 %  11/17/20 1130 95/66 -- -- 71 15 100 %  11/17/20 1115 (!) 112/97 -- -- 73 19 99 %  11/17/20 1100 97/61 -- -- 72 15 99 %  11/17/20 1045 99/66 -- -- 76 (!) 23 100 %  11/17/20 1030 102/63 -- -- 73 12 98 %  11/17/20 1015 95/62 (!) 97.4 F (36.3 C) -- 75 11 97 %      Intake/Output from previous day:   10/25 0701 - 10/26 0700 In: 2485.8 [P.O.:240; I.V.:1777.5] Out: 50    Intake/Output this shift:   No intake/output data recorded.   Intake/Output      10/25 0701 10/26 0700 10/26 0701 10/27 0700   P.O. 240    I.V. (mL/kg) 1777.5 (25.6)    IV Piggyback 468.3    Total Intake(mL/kg) 2485.8 (35.8)    Urine (mL/kg/hr) 0 (0)    Blood 50    Total Output 50    Net +2435.8         Urine Occurrence 4 x       LABORATORY DATA: Recent Labs    11/17/20 1253  WBC 7.9  HGB 13.5  HCT 43.5  PLT 317   Recent Labs     11/18/20 0315  NA 136  K 3.4*  CL 109  CO2 21*  BUN 13  CREATININE 0.88  GLUCOSE 98  CALCIUM 7.9*   Lab Results  Component Value Date   INR 1.02 03/13/2015    Recent Radiographic Studies :  XR Knee 1-2 Views Left  Result Date: 11/03/2020 2 view radiographs of the left knee were obtained today.  On the right knee on the AP she is status post right total knee arthroplasty excellent alignment no evidence of loosening of the components.  On the left side she has advanced degenerative changes with varus arthritis of the left knee.  She has osteophytes especially  medially.  Also patellofemoral arthrosis.    Examination:  General appearance: alert, cooperative, and no distress  Wound Exam: clean, dry, intact   Drainage:  None: wound tissue dry  Motor Exam: EHL and FHL Intact  Sensory Exam: Superficial Peroneal, Deep Peroneal, and Tibial normal  Vascular Exam: Normal  Assessment:    1 Day Post-Op  Procedure(s) (LRB): LEFT TOTAL KNEE ARTHROPLASTY (Left)  ADDITIONAL DIAGNOSIS:  Active Problems:   S/P TKR (total knee replacement) using cement, left  Hypokalemia-asymptomatic   Plan: Physical Therapy as ordered Weight Bearing as Tolerated (WBAT)  DVT Prophylaxis:  Aspirin, Foot Pumps, and TED hose  DISCHARGE PLAN: Home  DISCHARGE NEEDS: HHPT, CPM, Walker, and 3-in-1 comode seat   Patient's anticipated LOS is less than 2 midnights, meeting these requirements: - Younger than 27 - Lives within 1 hour of care - Has a competent adult at home to recover with post-op recover - NO history of  - Chronic pain requiring opiods  - Diabetes  - Coronary Artery Disease  - Heart failure  - Heart attack  - Stroke  - DVT/VTE  - Cardiac arrhythmia  - Respiratory Failure/COPD  - Renal failure  - Anemia  - Advanced Liver disease      Uncomfortable night but better this am-nerve block still partially active. Voiding without problem. Aquacell dressing applied-left knee wound  clean and dry. No calf pain. Consider DC after PT today   Biagio Borg, PA-C [place]  11/18/2020 7:13 AM

## 2020-11-18 NOTE — Plan of Care (Signed)
  Problem: Activity: Goal: Risk for activity intolerance will decrease Outcome: Progressing   Problem: Pain Managment: Goal: General experience of comfort will improve Outcome: Progressing   Problem: Safety: Goal: Ability to remain free from injury will improve Outcome: Progressing   

## 2020-11-18 NOTE — Progress Notes (Signed)
Physical Therapy Treatment Patient Details Name: Megan Valentine MRN: [id number] DOB: May 16, 1950 Today's Date: 11/18/2020   History of Present Illness 70 y.o. female admitted 11/17/20 for L TKA. PMH includes [name] 2017, hysterectomy, IBS, migraines, mitral valve prolapse, rectocele repair.    PT Comments    Pt reports 9/10 pain in L knee. She is tolerating only ~15* of L knee flexion AAROM and maintains toe touch weightbearing on L in standing 2* pain. Assisted pt to pivot to bedside commode, then to recliner. She was unable to tolerate ambulation 2* pain.     Recommendations for follow up therapy are one component of a multi-disciplinary discharge planning process, led by the attending physician.  Recommendations may be updated based on patient status, additional functional criteria and insurance authorization.  Follow Up Recommendations  Follow physician's recommendations for discharge plan and follow up therapies     Assistance Recommended at Discharge Intermittent Supervision/Assistance  Equipment Recommendations  None recommended by PT    Recommendations for Other Services       Precautions / Restrictions Precautions Precautions: Knee Precaution Booklet Issued: Yes (comment) Precaution Comments: reviewed no pillow under knee Restrictions Weight Bearing Restrictions: No LLE Weight Bearing: Weight bearing as tolerated Other Position/Activity Restrictions: WBAT     Mobility  Bed Mobility Overal bed mobility: Needs Assistance Bed Mobility: Supine to Sit     Supine to sit: Min assist     General bed mobility comments: min A for LLE OOB, pt unable to tolerate enough knee flexion to get LLE to the floor in sitting so elevated the bed and she then could get L foot to the floor    Transfers Overall transfer level: Needs assistance Equipment used: Rolling walker (2 wheels) Transfers: Sit to/from [employer] Transfers Sit to Stand: Min assist Stand pivot  transfers: Min assist         General transfer comment: VCs for hand placement, min A to power up and to steady, pt only able to tolerated TTWB LLE; pivoted to BSC then to recliner, pain limiting activity tolerance    Ambulation/Gait             General Gait Details: pt unable 2* pain L knee   Stairs             Wheelchair Mobility    Modified Rankin (Stroke Patients Only)       Balance Overall balance assessment: Needs assistance   Sitting balance-Leahy Scale: Good     Standing balance support: Bilateral upper extremity supported Standing balance-Leahy Scale: Poor Standing balance comment: relies on BUE support 2* poor tolerance of WBing through LLE                            Cognition Arousal/Alertness: Awake/alert Behavior During Therapy: WFL for tasks assessed/performed Overall Cognitive Status: Within Functional Limits for tasks assessed                                          Exercises Total Joint Exercises Ankle Circles/Pumps: AROM;Both;15 reps;Supine Quad Sets: AROM;Both;5 reps;Supine Heel Slides: AAROM;Left;Supine;5 reps;Limitations Heel Slides Limitations: only tolerated ~15* knee flexion, limited by pain Goniometric ROM: 0-15* AAROM L knee    General Comments        Pertinent Vitals/Pain Pain Score: 9  Pain Location: L knee Pain Descriptors / Indicators: Aching;Sore  Pain Intervention(s): Limited activity within patient's tolerance;Monitored during session;Premedicated before session;Ice applied;Repositioned    Home Living                          Prior Function            PT Goals (current goals can now be found in the care plan section) Acute Rehab PT Goals Patient Stated Goal: to walk for exercise PT Goal Formulation: With patient Time For Goal Achievement: 11/24/20 Potential to Achieve Goals: Good Progress towards PT goals: Progressing toward goals    Frequency     7X/week      PT Plan Current plan remains appropriate    Co-evaluation              AM-PAC PT "6 Clicks" Mobility   Outcome Measure  Help needed turning from your back to your side while in a flat bed without using bedrails?: A Little Help needed moving from lying on your back to sitting on the side of a flat bed without using bedrails?: A Little Help needed moving to and from a bed to a chair (including a wheelchair)?: A Little Help needed standing up from a chair using your arms (e.g., wheelchair or bedside chair)?: A Little Help needed to walk in hospital room?: A Lot Help needed climbing 3-5 steps with a railing? : Total 6 Click Score: 15    End of Session Equipment Utilized During Treatment: Gait belt Activity Tolerance: Patient tolerated treatment well Patient left: in chair;with call bell/phone within reach;with chair alarm set Nurse Communication: Mobility status PT Visit Diagnosis: Difficulty in walking, not elsewhere classified (R26.2);Pain;Muscle weakness (generalized) (M62.81) Pain - Right/Left: Left Pain - part of body: Knee     Time: 0347-4259 PT Time Calculation (min) (ACUTE ONLY): 23 min  Charges:  $Therapeutic Exercise: 8-22 mins $Therapeutic Activity: 8-22 mins                     Blondell Reveal Kistler PT 11/18/2020  Acute Rehabilitation Services Pager [phone] Office [phone]

## 2020-11-18 NOTE — Telephone Encounter (Signed)
[place] called stating they tried to get the patient on the CTM and patient could not tolerate it, they would like to know if you want attempt tomorrow ?  [phone]  CB #

## 2020-11-18 NOTE — TOC Transition Note (Signed)
Transition of Care [facility]) - CM/SW Discharge Note   Patient Details  Name: Megan Valentine MRN: [id number] Date of Birth: October 24, 1950  [facility] of [facility] CM/SW Contact:  Lennart Pall, LCSW Phone Number: 11/18/2020, 9:40 AM   Clinical Narrative:    Met with pt and confirming she has received all DME at her home via [place].  Prearranged with [facility] for HHPT.  No further TOC needs.   Final next level of care: [place] Barriers to Discharge: Continued Medical Work up   Patient Goals and CMS Choice Patient states their goals for this hospitalization and ongoing recovery are:: return home      Discharge Placement                       Discharge Plan and Services                DME Arranged: 3-N-1, Walker rolling, CPM DME Agency: [employer] Date DME Agency Contacted:  (prearranged via MD office)     HH Arranged: PT [place] Agency: [place] Date [place]:  (prearranged via MD office)      Social Determinants of Health (SDOH) Interventions     Readmission Risk Interventions No flowsheet data found.

## 2020-11-18 NOTE — Progress Notes (Signed)
Physical Therapy Treatment Patient Details Name: Megan Valentine MRN: [id number] DOB: 1950-04-09 Today's Date: 11/18/2020   History of Present Illness 70 y.o. female admitted 11/17/20 for L TKA. PMH includes [name] 2017, hysterectomy, IBS, migraines, mitral valve prolapse, rectocele repair.    PT Comments    Pt just transferred from recliner to bed prior to my arrival. She declined getting out of bed again 2* LLE pain and fatigue. She agreed to bed level TKA exercises. She is tolerating only very minimal movement of L knee (~15* of flexion AAROM). She is progressing slowly with mobility.     Recommendations for follow up therapy are one component of a multi-disciplinary discharge planning process, led by the attending physician.  Recommendations may be updated based on patient status, additional functional criteria and insurance authorization.  Follow Up Recommendations  Follow physician's recommendations for discharge plan and follow up therapies     Assistance Recommended at Discharge Intermittent Supervision/Assistance  Equipment Recommendations  None recommended by PT    Recommendations for Other Services       Precautions / Restrictions Precautions Precautions: Knee Precaution Booklet Issued: Yes (comment) Precaution Comments: reviewed no pillow under knee Restrictions Weight Bearing Restrictions: No LLE Weight Bearing: Weight bearing as tolerated Other Position/Activity Restrictions: WBAT     Mobility               Ambulation/Gait             General Gait Details: pt unable 2* pain L knee   Stairs             Wheelchair Mobility    Modified Rankin (Stroke Patients Only)       Balance Overall balance assessment: Needs assistance   Sitting balance-Leahy Scale: Good     Standing balance support: Bilateral upper extremity supported Standing balance-Leahy Scale: Poor Standing balance comment: relies on BUE support 2* poor tolerance of WBing  through LLE                            Cognition Arousal/Alertness: Awake/alert Behavior During Therapy: WFL for tasks assessed/performed Overall Cognitive Status: Within Functional Limits for tasks assessed                                          Exercises Total Joint Exercises Ankle Circles/Pumps: AROM;Both;15 reps;Supine Quad Sets: AROM;Both;5 reps;Supine Short Arc Quad: Limitations Short Arc Quad Limitations: could not tolerate thin pillow under knee to perform this exercise Heel Slides: AAROM;Left;Supine;Limitations;10 reps Heel Slides Limitations: only tolerated ~15* knee flexion, limited by pain Hip ABduction/ADduction: AAROM;Left;10 reps;Supine Straight Leg Raises: AAROM;Left;10 reps;Supine Goniometric ROM: 0-15* AAROM L knee    General Comments        Pertinent Vitals/Pain Pain Score: 7  Pain Location: L knee Pain Descriptors / Indicators: Aching;Sore;Operative site guarding Pain Intervention(s): Limited activity within patient's tolerance;Monitored during session;Premedicated before session;Ice applied;Repositioned    Home Living                          Prior Function            PT Goals (current goals can now be found in the care plan section) Acute Rehab PT Goals Patient Stated Goal: to walk for exercise PT Goal Formulation: With patient Time For Goal Achievement: 11/24/20 Potential  to Achieve Goals: Good Progress towards PT goals: Not progressing toward goals - comment (pain limiting progress)    Frequency    7X/week      PT Plan Current plan remains appropriate    Co-evaluation              AM-PAC PT "6 Clicks" Mobility   Outcome Measure  Help needed turning from your back to your side while in a flat bed without using bedrails?: A Little Help needed moving from lying on your back to sitting on the side of a flat bed without using bedrails?: A Little Help needed moving to and from a bed to a  chair (including a wheelchair)?: A Little Help needed standing up from a chair using your arms (e.g., wheelchair or bedside chair)?: A Little Help needed to walk in hospital room?: A Lot Help needed climbing 3-5 steps with a railing? : Total 6 Click Score: 15    End of Session Equipment Utilized During Treatment: Gait belt Activity Tolerance: Patient tolerated treatment well Patient left: with call bell/phone within reach;in bed;with bed alarm set Nurse Communication: Mobility status PT Visit Diagnosis: Difficulty in walking, not elsewhere classified (R26.2);Pain;Muscle weakness (generalized) (M62.81) Pain - Right/Left: Left Pain - part of body: Knee     Time: 7588-3254 PT Time Calculation (min) (ACUTE ONLY): 13 min  Charges:  $Therapeutic Exercise: 8-22 mins                      Blondell Reveal Kistler PT 11/18/2020  Acute Rehabilitation Services Pager [phone] Office [phone]

## 2020-11-18 NOTE — Progress Notes (Signed)
Orthopedic Tech Progress Note Patient Details:  Megan Valentine [birth year] [id number]  Ortho Devices Type of Ortho Device: CPM padding Ortho Device/Splint Location: LLE Ortho Device/Splint Interventions: Ordered, Application   Post Interventions Patient Tolerated: Poor Instructions Provided: Adjustment of device Pt was unable to tolerate the CPM, as it caused her too much pain. She stated "I feel like I'm going to throw up". Removed CPM and let nurse know. Reaching out to MD to see if he wants to continue CPM use. [name] 11/18/2020, 4:15 PM

## 2020-11-19 ENCOUNTER — Telehealth: Payer: Self-pay | Admitting: Physician Assistant

## 2020-11-19 LAB — BASIC METABOLIC PANEL
Anion gap: 7 (ref 5–15)
BUN: 11 mg/dL (ref 8–23)
CO2: 23 mmol/L (ref 22–32)
Calcium: 8.2 mg/dL — ABNORMAL LOW (ref 8.9–10.3)
Chloride: 110 mmol/L (ref 98–111)
Creatinine, Ser: 0.8 mg/dL (ref 0.44–1.00)
GFR, Estimated: >60 mL/min (ref >60)
Glucose, Bld: 134 mg/dL — ABNORMAL HIGH (ref 70–99)
Potassium: 3.5 mmol/L (ref 3.5–5.1)
Sodium: 140 mmol/L (ref 135–145)

## 2020-11-19 MED ORDER — OXYCODONE HCL 5 MG PO TABS: 5.0000 mg | ORAL_TABLET | ORAL | 0 refills | Status: DC | PRN

## 2020-11-19 MED ORDER — ONDANSETRON HCL 4 MG PO TABS: 4.0000 mg | ORAL_TABLET | Freq: Four times a day (QID) | ORAL | 0 refills | Status: DC | PRN

## 2020-11-19 MED ORDER — METHOCARBAMOL 500 MG PO TABS: 500.0000 mg | ORAL_TABLET | Freq: Four times a day (QID) | ORAL | 0 refills | Status: DC | PRN

## 2020-11-19 MED ORDER — ACETAMINOPHEN 500 MG PO TABS: 500.0000 mg | ORAL_TABLET | Freq: Four times a day (QID) | ORAL | 0 refills | Status: DC

## 2020-11-19 MED ORDER — ASPIRIN 81 MG PO CHEW: 81.0000 mg | CHEWABLE_TABLET | Freq: Two times a day (BID) | ORAL | Status: AC

## 2020-11-19 NOTE — Progress Notes (Signed)
Orthopedic Tech Progress Note Patient Details:  Megan Valentine [birth year] [id number]  CPM Left Knee Left Knee Flexion (Degrees): 50 Left Knee Extension (Degrees): 0 Additional Comments: Patient was unable to tolerateand did not want to be in the CPM.  Post Interventions Patient Tolerated: Fair Instructions Provided: Adjustment of device Pt able to tolerate 0-50 on CPM; will check on her in an hour to see if she would like to be taken out, or use the CPM for an additional hour.  [place] A Arsema Tusing 11/19/2020, 1:30 PM

## 2020-11-19 NOTE — Telephone Encounter (Signed)
[place] from [place] called. Patient was able to tolerate 0-50 on CTM Her number is [phone]

## 2020-11-19 NOTE — Telephone Encounter (Signed)
FYI patient is able to tolerate CPM today. S/P L TKA 11/17/2020

## 2020-11-19 NOTE — Plan of Care (Signed)
  Problem: Pain Managment: Goal: General experience of comfort will improve Outcome: Progressing   Problem: Coping: Goal: Level of anxiety will decrease Outcome: Progressing   

## 2020-11-19 NOTE — Telephone Encounter (Signed)
Plan on discharge today-let her determine her activity level with the CPM machine. Will have home PT

## 2020-11-19 NOTE — Progress Notes (Signed)
Orthopedic Tech Progress Note Patient Details:  [name] 05-16-1950 [id number]  Patient ID: Megan Valentine, female   DOB: [birth year], 70 y.o.   MRN: [id number] In chart to view notes from MD regarding CPM usage. [name] 11/19/2020, 10:40 AM

## 2020-11-19 NOTE — Discharge Summary (Addendum)
Megan Fears, MD   Megan Borg, PA-C [address] [place], [place]  [address]                             [phone]  PATIENT ID: Megan Valentine        MRN:  [id number]          DOB/AGE: 03/22/1950 / 70 y.o.    DISCHARGE SUMMARY  ADMISSION DATE:    11/17/2020 DISCHARGE DATE:   11/20/2020   ADMISSION DIAGNOSIS: S/P TKR (total knee replacement) using cement, left [Z96.652]    DISCHARGE DIAGNOSIS:  LEFT KNEE OSTEOARTHRITIS    ADDITIONAL DIAGNOSIS: Active Problems:   S/P TKR (total knee replacement) using cement, left  Past Medical History:  Diagnosis Date   Allergy    Arthritis    Asthma    Cataract    Chronic bronchitis (HCC)    Chronic kidney disease    stage 3, kidney infections   Complication of anesthesia    hard to wake up   Diverticulosis    Environmental allergies    GERD (gastroesophageal reflux disease)    Hiatal hernia    History of chicken pox    Hyperlipidemia    Hypothyroidism    IBS (irritable bowel syndrome)    Migraines    Mitral valve prolapse    Per pt . per Dr.Henry  [name].  does not have MVP   Neuromuscular disorder (HCC)    neuropathy feet   Pneumonia    Rectal prolapse     PROCEDURE: Procedure(s): LEFT TOTAL KNEE ARTHROPLASTY  on 11/17/2020  CONSULTS: none    HISTORY:  see H&P in chart  HOSPITAL COURSE:  Megan Valentine is a 70 y.o. admitted on 11/17/2020 and found to have a diagnosis of [place].  After appropriate laboratory studies were obtained  they were taken to the operating room on 11/17/2020 and underwent  Procedure(s): LEFT TOTAL KNEE ARTHROPLASTY  .   They were given perioperative antibiotics:  Anti-infectives (From admission, onward)    Start     Dose/Rate Route Frequency Ordered Stop   11/17/20 1400  ceFAZolin (ANCEF) IVPB 1 g/50 mL premix        1 g 100 mL/hr over 30 Minutes Intravenous Every 6 hours 11/17/20 1209 11/17/20 2049   11/17/20 0600  ceFAZolin (ANCEF) IVPB 2g/100 mL premix         2 g 200 mL/hr over 30 Minutes Intravenous On call to O.R. 11/17/20 0932 11/17/20 0751     .  Tolerated the procedure well.    POD #1, allowed out of bed to a chair.  PT for ambulation and exercise program.   IV saline locked.  O2 discontionued.  POD #2, continued PT and ambulation.  Had difficulty with pain management.  POD#3 continued with PT . Doing much better this morning. Tolerated CPM 0-60 for 3 hours. Pain better managed. Patient is agreeable to being discharged today  Blood products given:none  DIAGNOSTIC STUDIES: Recent vital signs: Patient Vitals for the past 24 hrs:  BP Temp Temp src Pulse Resp SpO2  11/19/20 0918 123/72 99.1 F (37.3 C) -- 89 16 95 %  11/19/20 0851 -- -- -- -- -- 96 %  11/19/20 0510 122/66 98.8 F (37.1 C) Oral 91 15 97 %  11/18/20 2039 132/65 99.5 F (37.5 C) Oral 96 20 96 %  11/18/20 2017 -- -- -- -- -- 97 %  11/18/20 1800 (!) 121/58 (!) 100.5 F (38.1 C) Oral 89 18 97 %  11/18/20 1328 (!) 108/56 98.9 F (37.2 C) -- 80 16 100 %  11/18/20 1009 (!) 90/57 98.6 F (37 C) Oral 73 18 98 %       Recent laboratory studies: Recent Labs    11/17/20 1253  WBC 7.9  HGB 13.5  HCT 43.5  PLT 317   Recent Labs    11/18/20 0315 11/19/20 0325  NA 136 140  K 3.4* 3.5  CL 109 110  CO2 21* 23  BUN 13 11  CREATININE 0.88 0.80  GLUCOSE 98 134*  CALCIUM 7.9* 8.2*   Lab Results  Component Value Date   INR 1.02 03/13/2015     Recent Radiographic Studies :  XR Knee 1-2 Views Left  Result Date: 11/03/2020 2 view radiographs of the left knee were obtained today.  On the right knee on the AP she is status post right total knee arthroplasty excellent alignment no evidence of loosening of the components.  On the left side she has advanced degenerative changes with varus arthritis of the left knee.  She has osteophytes especially medially.  Also patellofemoral arthrosis.   DISCHARGE INSTRUCTIONS:   DISCHARGE MEDICATIONS:   Allergies as of  11/20/2020   FOLLOW UP VISIT:    Follow-up Information     [facility] Follow up.   Specialty: Home Health Services Why: to provide home health physical therapy Contact information: [address] [place] [address] [phone]         Megan Balding, MD. Go on 12/01/2020.   Specialty: Orthopedic Surgery Why: at 1:00 pm with Talitha Givens, PA-C for Dr. Durward Fortes for your 2 week post op appointment. Contact information: [place] [address] [phone]         [facility]. Go on 12/01/2020.   Specialty: Rehabilitation Why: at 1:45 pm for your first Outpatient Physical Therapy appointment with our office. Please check in upstairs after your appointment with the PA. Contact information: [place] [address] [phone]                DISPOSITION:   Home  CONDITION:  Stable  INSTRUCTIONS AFTER JOINT REPLACEMENT   Remove items at home which could result in a fall. This includes throw rugs or furniture in walking pathways ICE to the affected joint every three hours while awake for 30 minutes at a time, for at least the first 3-5 days, and then as needed for pain and swelling.  Continue to use ice for pain and swelling. You may notice swelling that will progress down to the foot and ankle.  This is normal after surgery.  Elevate your leg when you are not up walking on it.   Continue to use the breathing machine you got in the hospital (incentive spirometer) which will help keep your temperature down.  It is common for your temperature to cycle up and down following surgery, especially at night when you are not up moving around and exerting yourself.  The breathing machine keeps your lungs expanded and your temperature down.   DIET:  As you were doing prior to hospitalization, we recommend a well-balanced diet.  DRESSING / WOUND CARE / SHOWERING  Keep the surgical  dressing until follow up.  The dressing is water proof, so you can shower without any extra covering.  IF THE DRESSING FALLS OFF or the  wound gets wet inside, change the dressing with sterile gauze.  Please use good hand washing techniques before changing the dressing.  Do not use any lotions or creams on the incision until instructed by your surgeon.    ACTIVITY  Increase activity slowly as tolerated, but follow the weight bearing instructions below.   No driving for 6 weeks or until further direction given by your physician.  You cannot drive while taking narcotics.  No lifting or carrying greater than 10 lbs. until further directed by your surgeon. Avoid periods of inactivity such as sitting longer than an hour when not asleep. This helps prevent blood clots.  You may return to work once you are authorized by your doctor.     WEIGHT BEARING   Weight bearing as tolerated with assist device (walker, cane, etc) as directed, use it as long as suggested by your surgeon or therapist, typically at least 4-6 weeks.   EXERCISES  Results after joint replacement surgery are often greatly improved when you follow the exercise, range of motion and muscle strengthening exercises prescribed by your doctor. Safety measures are also important to protect the joint from further injury. Any time any of these exercises cause you to have increased pain or swelling, decrease what you are doing until you are comfortable again and then slowly increase them. If you have problems or questions, call your caregiver or physical therapist for advice.   Rehabilitation is important following a joint replacement. After just a few days of immobilization, the muscles of the leg can become weakened and shrink (atrophy).  These exercises are designed to build up the tone and strength of the thigh and leg muscles and to improve motion. Often times heat used for twenty to thirty minutes before working out will loosen up your  tissues and help with improving the range of motion but do not use heat for the first two weeks following surgery (sometimes heat can increase post-operative swelling).   These exercises can be done on a training (exercise) mat, on the floor, on a table or on a bed. Use whatever works the best and is most comfortable for you.    Use music or television while you are exercising so that the exercises are a pleasant break in your day. This will make your life better with the exercises acting as a break in your routine that you can look forward to.   Perform all exercises about fifteen times, three times per day or as directed.  You should exercise both the operative leg and the other leg as well.  Exercises include:   Quad Sets - Tighten up the muscle on the front of the thigh (Quad) and hold for 5-10 seconds.   Straight Leg Raises - With your knee straight (if you were given a brace, keep it on), lift the leg to 60 degrees, hold for 3 seconds, and slowly lower the leg.  Perform this exercise against resistance later as your leg gets stronger.  Leg Slides: Lying on your back, slowly slide your foot toward your buttocks, bending your knee up off the floor (only go as far as is comfortable). Then slowly slide your foot back down until your leg is flat on the floor again.  Angel Wings: Lying on your back spread your legs to the side as far apart as you can without causing discomfort.  Hamstring Strength:  Lying on your back, push your heel against the floor with your leg straight by tightening up the  muscles of your buttocks.  Repeat, but this time bend your knee to a comfortable angle, and push your heel against the floor.  You may put a pillow under the heel to make it more comfortable if necessary.   A rehabilitation program following joint replacement surgery can speed recovery and prevent re-injury in the future due to weakened muscles. Contact your doctor or a physical therapist for more information on  knee rehabilitation.    CONSTIPATION  Constipation is defined medically as fewer than three stools per week and severe constipation as less than one stool per week.  Even if you have a regular bowel pattern at home, your normal regimen is likely to be disrupted due to multiple reasons following surgery.  Combination of anesthesia, postoperative narcotics, change in appetite and fluid intake all can affect your bowels.   YOU MUST use at least one of the following options; they are listed in order of increasing strength to get the job done.  They are all available over the counter, and you may need to use some, POSSIBLY even all of these options:    Drink plenty of fluids (prune juice may be helpful) and high fiber foods Colace 100 mg by mouth twice a day  Senokot for constipation as directed and as needed Dulcolax (bisacodyl), take with full glass of water  Miralax (polyethylene glycol) once or twice a day as needed.  If you have tried all these things and are unable to have a bowel movement in the first 3-4 days after surgery call either your surgeon or your primary doctor.    If you experience loose stools or diarrhea, hold the medications until you stool forms back up.  If your symptoms do not get better within 1 week or if they get worse, check with your doctor.  If you experience "the worst abdominal pain ever" or develop nausea or vomiting, please contact the office immediately for further recommendations for treatment.   ITCHING:  If you experience itching with your medications, try taking only a single pain pill, or even half a pain pill at a time.  You can also use Benadryl over the counter for itching or also to help with sleep.   TED HOSE STOCKINGS:  Use stockings on both legs until for at least 2 weeks or as directed by physician office. They may be removed at night for sleeping.  MEDICATIONS:  See your medication summary on the "After Visit Summary" that nursing will review with  you.  You may have some home medications which will be placed on hold until you complete the course of blood thinner medication.  It is important for you to complete the blood thinner medication as prescribed.  PRECAUTIONS:  If you experience chest pain or shortness of breath - call 911 immediately for transfer to the hospital emergency department.   If you develop a fever greater that 101 F, purulent drainage from wound, increased redness or drainage from wound, foul odor from the wound/dressing, or calf pain - CONTACT YOUR SURGEON.                                                   FOLLOW-UP APPOINTMENTS:  If you do not already have a post-op appointment, please call the office for an appointment to be seen by your surgeon.  Guidelines for how  soon to be seen are listed in your "After Visit Summary", but are typically between 1-4 weeks after surgery.  OTHER INSTRUCTIONS:   Knee Replacement:  Do not place pillow under knee, focus on keeping the knee straight while resting. CPM instructions: 0-90 degrees, 2 hours in the morning, 2 hours in the afternoon, and 2 hours in the evening. Place foam block, curve side up under heel at all times except when in CPM or when walking.  DO NOT modify, tear, cut, or change the foam block in any way.  POST-OPERATIVE OPIOID TAPER INSTRUCTIONS: It is important to wean off of your opioid medication as soon as possible. If you do not need pain medication after your surgery it is ok to stop day one. Opioids include: Codeine, Hydrocodone(Norco, Vicodin), Oxycodone(Percocet, oxycontin) and hydromorphone amongst others.  Long term and even short term use of opiods can cause: Increased pain response Dependence Constipation Depression Respiratory depression And more.  Withdrawal symptoms can include Flu like symptoms Nausea, vomiting And more Techniques to manage these symptoms Hydrate well Eat regular healthy meals Stay active Use relaxation techniques(deep  breathing, meditating, yoga) Do Not substitute Alcohol to help with tapering If you have been on opioids for less than two weeks and do not have pain than it is ok to stop all together.  Plan to wean off of opioids This plan should start within one week post op of your joint replacement. Maintain the same interval or time between taking each dose and first decrease the dose.  Cut the total daily intake of opioids by one tablet each day Next start to increase the time between doses. The last dose that should be eliminated is the evening dose.   MAKE SURE YOU:  Understand these instructions.  Get help right away if you are not doing well or get worse.    Thank you for letting us be a part of your medical care team.  It is a privilege we respect greatly.  We hope these instructions will help you stay on track for a fast and full recovery!       Mike Craze [place], [place] [phone] Discharge 11/20/2020  9:28 AM

## 2020-11-19 NOTE — Op Note (Signed)
PATIENT ID: Megan Valentine        MRN:  [id number]          DOB/AGE: 70-Mar-1952 / 70 y.o.    Joni Fears, MD   Biagio Borg, PA-C [address] [place], [place]  [address]                             [phone]   PROGRESS NOTE  Subjective:  negative for Chest Pain  negative for Shortness of Breath  negative for Nausea/Vomiting   negative for Calf Pain    Tolerating Diet: yes         Patient reports pain as moderate.    Better pain control today  Objective: Vital signs in last 24 hours:   Patient Vitals for the past 24 hrs:  BP Temp Temp src Pulse Resp SpO2  11/19/20 0851 -- -- -- -- -- 96 %  11/19/20 0510 122/66 98.8 F (37.1 C) Oral 91 15 97 %  11/18/20 2039 132/65 99.5 F (37.5 C) Oral 96 20 96 %  11/18/20 2017 -- -- -- -- -- 97 %  11/18/20 1800 (!) 121/58 (!) 100.5 F (38.1 C) Oral 89 18 97 %  11/18/20 1328 (!) 108/56 98.9 F (37.2 C) -- 80 16 100 %  11/18/20 1009 (!) 90/57 98.6 F (37 C) Oral 73 18 98 %      Intake/Output from previous day:   10/26 0701 - 10/27 0700 In: 968.3 [P.O.:480] Out: 650 [Urine:650]   Intake/Output this shift:   No intake/output data recorded.   Intake/Output      10/26 0701 10/27 0700 10/27 0701 10/28 0700   P.O. 480    I.V. (mL/kg)     IV Piggyback 488.3    Total Intake(mL/kg) 968.3 (14)    Urine (mL/kg/hr) 650 (0.4)    Blood     Total Output 650    Net +318.3            LABORATORY DATA: Recent Labs    11/17/20 1253  WBC 7.9  HGB 13.5  HCT 43.5  PLT 317   Recent Labs    11/18/20 0315 11/19/20 0325  NA 136 140  K 3.4* 3.5  CL 109 110  CO2 21* 23  BUN 13 11  CREATININE 0.88 0.80  GLUCOSE 98 134*  CALCIUM 7.9* 8.2*   Lab Results  Component Value Date   INR 1.02 03/13/2015    Recent Radiographic Studies :  XR Knee 1-2 Views Left  Result Date: 11/03/2020 2 view radiographs of the left knee were obtained today.  On the right knee on the AP she is status post right total knee arthroplasty  excellent alignment no evidence of loosening of the components.  On the left side she has advanced degenerative changes with varus arthritis of the left knee.  She has osteophytes especially medially.  Also patellofemoral arthrosis.    Examination:  General appearance: alert, cooperative, and no distress  Wound Exam: clean, dry, intact   Drainage:  None: wound tissue dry  Motor Exam: EHL, FHL, Anterior Tibial, and Posterior Tibial Intact  Sensory Exam: Superficial Peroneal, Deep Peroneal, and Tibial normal  Vascular Exam: Normal  Assessment:    2 Days Post-Op  Procedure(s) (LRB): LEFT TOTAL KNEE ARTHROPLASTY (Left)  ADDITIONAL DIAGNOSIS:  Active Problems:   S/P TKR (total knee replacement) using cement, left  Hypokalemia-resolved   Plan: Physical Therapy as ordered Weight  Bearing as Tolerated (WBAT)  DVT Prophylaxis:  Aspirin and TED hose  DISCHARGE PLAN: Home  DISCHARGE NEEDS: HHPT, CPM, Walker, and 3-in-1 comode seat  Anticipated LOS equal to or greater than 2 midnights due to - Age 70 and older with one or more of the following:  - Obesity  - Expected need for hospital services (PT, OT, Nursing) required for safe  discharge  - Anticipated need for postoperative skilled nursing care or inpatient rehab  - Active co-morbidities: None OR   - Unanticipated findings during/Post Surgery: Slow post-op progression: GI, pain control, mobility  - Patient is a high risk of re-admission due to: None      Feeling better today-some nausea yesterday but "OK:"this am. Tolerating diet, No problems voiding. No calf pain. Slow effort in PT but think she is fine for discharge Biagio Borg, Hershal Coria [place]  11/19/2020 9:17 AM

## 2020-11-19 NOTE — Telephone Encounter (Signed)
Contacted brittany with Dr. Wonda Horner response. She states that patient was unable to tolerate 0-50 on CPM. States that she was going to see patient today (11/19/2020) to see if she was able to tolerate CPM and would contact our office back.

## 2020-11-19 NOTE — Progress Notes (Signed)
Physical Therapy Treatment Patient Details Name: Megan Valentine MRN: [id number] DOB: [birth year] Today's Date: 11/19/2020   History of Present Illness 70 y.o. female admitted 11/17/20 for L TKA. PMH includes [name] 2017, hysterectomy, IBS, migraines, mitral valve prolapse, rectocele repair.    PT Comments    Pt initially states she is feeling better today, self-reports she has more knee ROM and has more ease performing exercises. PT assisted pt to EOB to prepare for standing, requiring min assist for trunk and LLE management, but once sitting up pt states "I'm going to pass out, it hurts so bad, I can't do it" and begins to lay self back down in bed. Pt refused further transfer attempt this am. BP 129/77 and HR 82 bpm immediately after feeling like she was going to pass out. RN notified of pt's poor tolerance for session.    Recommendations for follow up therapy are one component of a multi-disciplinary discharge planning process, led by the attending physician.  Recommendations may be updated based on patient status, additional functional criteria and insurance authorization.  Follow Up Recommendations  Follow physician's recommendations for discharge plan and follow up therapies     Assistance Recommended at Discharge Intermittent Supervision/Assistance  Equipment Recommendations  None recommended by PT    Recommendations for Other Services       Precautions / Restrictions Precautions Precautions: Knee Restrictions LLE Weight Bearing: Weight bearing as tolerated Other Position/Activity Restrictions: WBAT     Mobility  Bed Mobility Overal bed mobility: Needs Assistance Bed Mobility: Supine to Sit;Sit to Supine     Supine to sit: Min assist;HOB elevated Sit to supine: Min assist;HOB elevated   General bed mobility comments: min assist for trunk and LLE translation to/from EOB. Very increased time, multiple stop/starts and x2 return to supine before coming to EOB.     Transfers                   General transfer comment: pt refused due to pain    Ambulation/Gait                 Stairs             Wheelchair Mobility    Modified Rankin (Stroke Patients Only)       Balance Overall balance assessment: Needs assistance   Sitting balance-Leahy Scale: Good     Standing balance support: Bilateral upper extremity supported                                Cognition Arousal/Alertness: Awake/alert Behavior During Therapy: WFL for tasks assessed/performed Overall Cognitive Status: Within Functional Limits for tasks assessed                                          Exercises Total Joint Exercises Ankle Circles/Pumps: AROM;Both;10 reps;Supine Quad Sets: AAROM;Left;10 reps;Supine Heel Slides: AAROM;Left;Supine;Limitations;10 reps Heel Slides Limitations: very limited ROM, guards closely Goniometric ROM: 5-20 deg    General Comments        Pertinent Vitals/Pain Pain Assessment: 0-10 Pain Score: 10-Worst pain ever Pain Location: L knee Pain Descriptors / Indicators: Aching;Sore;Operative site guarding Pain Intervention(s): Limited activity within patient's tolerance;Monitored during session;Premedicated before session;Repositioned    Home Living  Prior Function            PT Goals (current goals can now be found in the care plan section) Acute Rehab PT Goals Patient Stated Goal: to walk for exercise PT Goal Formulation: With patient Time For Goal Achievement: 11/24/20 Potential to Achieve Goals: Good Progress towards PT goals: Not progressing toward goals - comment (refused to stand)    Frequency    7X/week      PT Plan Current plan remains appropriate    Co-evaluation              AM-PAC PT "6 Clicks" Mobility   Outcome Measure  Help needed turning from your back to your side while in a flat bed without using bedrails?: A  Little Help needed moving from lying on your back to sitting on the side of a flat bed without using bedrails?: A Little Help needed moving to and from a bed to a chair (including a wheelchair)?: A Lot Help needed standing up from a chair using your arms (e.g., wheelchair or bedside chair)?: A Lot Help needed to walk in hospital room?: A Lot Help needed climbing 3-5 steps with a railing? : Total 6 Click Score: 13    End of Session   Activity Tolerance: Patient limited by pain Patient left: with call bell/phone within reach;in bed;with bed alarm set Nurse Communication: Mobility status;Other (comment) (severe pain, refusal to try standing) PT Visit Diagnosis: Difficulty in walking, not elsewhere classified (R26.2);Pain;Muscle weakness (generalized) (M62.81) Pain - Right/Left: Left Pain - part of body: Knee     Time: 0300-9233 PT Time Calculation (min) (ACUTE ONLY): 18 min  Charges:  $Therapeutic Activity: 8-22 mins                     Stacie Glaze, PT DPT Acute Rehabilitation Services Pager [phone]  Office [phone]    [name] 11/19/2020, 1:44 PM

## 2020-11-19 NOTE — Care Plan (Signed)
Ortho Bundle Case Management Note  Patient Details  Name: Megan Valentine MRN: [id number] Date of Birth: [birth year]  [facility] office RNCM met with patient during her H&P appointment prior to surgery. She is an Ortho bundle patient through [facility] and is agreeable to case management. She has a RW and 3in1 at home already. She states her RW may not be accessible, but she will let me know if she needs another. Her home CPM will be ordered through [facility] to be brought to her prior to surgery. Anticipate need for HHPT after short hospital stay. Referral made to [facility] after choice provided. Also she would like to schedule her OPPT here at [facility] when appropriate. CM will assist with this. Reviewed all post op care instructions. Will continue to follow for needs.         11/19/20-Patient seen in hospital today. Doing better overnight with pain control and slept well. Updated MD. Encouragement to work hard with therapy and most likely going home today.             DME Arranged:   ([employer] delivered all DME including CPM to home prior to surgery.) DME Agency:  [employer]  HH Arranged:  PT HH Agency:  [facility]  Additional Comments: Please contact me with any questions of if this plan should need to change.  Jamse Arn, RN, BSN, [employer]  [phone] 11/19/2020, 8:56 AM

## 2020-11-19 NOTE — Telephone Encounter (Signed)
Please see message below. Patient is s/p L TKA preformed on 11/17/2020

## 2020-11-19 NOTE — Telephone Encounter (Signed)
Thanks-OK for discharge

## 2020-11-19 NOTE — Progress Notes (Signed)
Physical Therapy Treatment Patient Details Name: Megan Valentine MRN: [id number] DOB: 01-Apr-1950 Today's Date: 11/19/2020   History of Present Illness 70 y.o. female admitted 11/17/20 for L TKA. PMH includes [name] 2017, hysterectomy, IBS, migraines, mitral valve prolapse, rectocele repair.    PT Comments    Pt on CPM upon PT arrival, removed at 1445. Pt very self-limiting and incontinent of urine x2 in standing, alerts PT by saying "I'm peeing". This has not been an issue in previous sessions, PT recommended pt to wear briefs at home which she has. Pt tolerated x4 ft gait only before stopping, stating "I have to sit down". Pt with very limited tolerance for mobility at this time, from a PT perspective would benefit from another night which pt is requesting, but unsure how much of this is pt's self-limiting due to pain. PT to continue to follow while acute.      Recommendations for follow up therapy are one component of a multi-disciplinary discharge planning process, led by the attending physician.  Recommendations may be updated based on patient status, additional functional criteria and insurance authorization.  Follow Up Recommendations  Follow physician's recommendations for discharge plan and follow up therapies     Assistance Recommended at Discharge Intermittent Supervision/Assistance  Equipment Recommendations  None recommended by PT    Recommendations for Other Services       Precautions / Restrictions Precautions Precautions: Knee Restrictions LLE Weight Bearing: Weight bearing as tolerated Other Position/Activity Restrictions: WBAT     Mobility  Bed Mobility Overal bed mobility: Needs Assistance Bed Mobility: Supine to Sit;Sit to Supine     Supine to sit: Min assist;HOB elevated Sit to supine: Min assist;HOB elevated   General bed mobility comments: min assist for trunk and LLE translation to/from EOB. Very increased time, requires mod encouragement to  continue.    Transfers Overall transfer level: Needs assistance Equipment used: Rolling walker (2 wheels) Transfers: Sit to/from Stand Sit to Stand: Min assist;+2 physical assistance           General transfer comment: min +2 for power up, rise, steadying. Pt incontinent of urine x2 in standing, on two separate ocassions a couple minutes apart. Pt states "I'm peeing" as warning.    Ambulation/Gait Ambulation/Gait assistance: Min assist;+2 physical assistance Gait Distance (Feet): 4 Feet Assistive device: Rolling walker (2 wheels) Gait Pattern/deviations: Step-to pattern;Decreased step length - left;Decreased weight shift to left;Trunk flexed Gait velocity: decr   General Gait Details: min assist +2 to steady, guide pt/RW, cues for step-by-step sequencing. Pt states "I need to sit, I have to stop" after 4 ft ambulation.   Stairs             Wheelchair Mobility    Modified Rankin (Stroke Patients Only)       Balance Overall balance assessment: Needs assistance   Sitting balance-Leahy Scale: Good     Standing balance support: Bilateral upper extremity supported Standing balance-Leahy Scale: Poor                              Cognition Arousal/Alertness: Awake/alert Behavior During Therapy: WFL for tasks assessed/performed Overall Cognitive Status: Within Functional Limits for tasks assessed                                 General Comments: pt is self-limiting, and when pt asked to mobilize pt urinates  on self x2 2 minutes apart.        Exercises Total Joint Exercises Ankle Circles/Pumps: AROM;Both;10 reps;Supine Quad Sets: AAROM;Left;10 reps;Supine Heel Slides: AAROM;Left;Supine;Limitations;10 reps Heel Slides Limitations: very limited ROM, guards closely Goniometric ROM: 5-20 deg knee aarom (pt tolerating 0-40 deg on CPM upon PT arrival)    General Comments        Pertinent Vitals/Pain Pain Assessment: Faces Pain Score:  10-Worst pain ever Faces Pain Scale: Hurts whole lot Pain Location: L knee Pain Descriptors / Indicators: Aching;Sore;Operative site guarding Pain Intervention(s): Limited activity within patient's tolerance;Monitored during session;Repositioned;Premedicated before session    Home Living                          Prior Function            PT Goals (current goals can now be found in the care plan section) Acute Rehab PT Goals Patient Stated Goal: to walk for exercise PT Goal Formulation: With patient Time For Goal Achievement: 11/24/20 Potential to Achieve Goals: Good Progress towards PT goals: Progressing toward goals    Frequency    7X/week      PT Plan Current plan remains appropriate    Co-evaluation              AM-PAC PT "6 Clicks" Mobility   Outcome Measure  Help needed turning from your back to your side while in a flat bed without using bedrails?: A Little Help needed moving from lying on your back to sitting on the side of a flat bed without using bedrails?: A Little Help needed moving to and from a bed to a chair (including a wheelchair)?: A Little Help needed standing up from a chair using your arms (e.g., wheelchair or bedside chair)?: A Little Help needed to walk in hospital room?: A Lot Help needed climbing 3-5 steps with a railing? : Total 6 Click Score: 15    End of Session Equipment Utilized During Treatment: Gait belt Activity Tolerance: Patient limited by pain Patient left: with call bell/phone within reach;in chair;with chair alarm set Nurse Communication: Mobility status;Other (comment) (urinary incontinence, requesting to stay another night) PT Visit Diagnosis: Difficulty in walking, not elsewhere classified (R26.2);Pain;Muscle weakness (generalized) (M62.81) Pain - Right/Left: Left Pain - part of body: Knee     Time: 1517-6160 PT Time Calculation (min) (ACUTE ONLY): 18 min  Charges:  $Therapeutic Activity: 8-22 mins                      Stacie Glaze, PT DPT Acute Rehabilitation Services Pager [phone]  Office [phone]    [name] 11/19/2020, 3:29 PM

## 2020-11-20 DIAGNOSIS — Z90710 Acquired absence of both cervix and uterus: Secondary | ICD-10-CM | POA: Diagnosis not present

## 2020-11-20 DIAGNOSIS — Z87891 Personal history of nicotine dependence: Secondary | ICD-10-CM | POA: Diagnosis not present

## 2020-11-20 DIAGNOSIS — Z833 Family history of diabetes mellitus: Secondary | ICD-10-CM | POA: Diagnosis not present

## 2020-11-20 DIAGNOSIS — E039 Hypothyroidism, unspecified: Secondary | ICD-10-CM | POA: Diagnosis present

## 2020-11-20 DIAGNOSIS — Z8616 Personal history of COVID-19: Secondary | ICD-10-CM | POA: Diagnosis not present

## 2020-11-20 DIAGNOSIS — Z8349 Family history of other endocrine, nutritional and metabolic diseases: Secondary | ICD-10-CM | POA: Diagnosis not present

## 2020-11-20 DIAGNOSIS — Z96651 Presence of right artificial knee joint: Secondary | ICD-10-CM | POA: Diagnosis present

## 2020-11-20 DIAGNOSIS — Z885 Allergy status to narcotic agent status: Secondary | ICD-10-CM | POA: Diagnosis not present

## 2020-11-20 DIAGNOSIS — Z823 Family history of stroke: Secondary | ICD-10-CM | POA: Diagnosis not present

## 2020-11-20 DIAGNOSIS — M1712 Unilateral primary osteoarthritis, left knee: Secondary | ICD-10-CM | POA: Diagnosis present

## 2020-11-20 DIAGNOSIS — E876 Hypokalemia: Secondary | ICD-10-CM | POA: Diagnosis present

## 2020-11-20 DIAGNOSIS — Z79899 Other long term (current) drug therapy: Secondary | ICD-10-CM | POA: Diagnosis not present

## 2020-11-20 DIAGNOSIS — Z8249 Family history of ischemic heart disease and other diseases of the circulatory system: Secondary | ICD-10-CM | POA: Diagnosis not present

## 2020-11-20 DIAGNOSIS — Z881 Allergy status to other antibiotic agents status: Secondary | ICD-10-CM | POA: Diagnosis not present

## 2020-11-20 DIAGNOSIS — J45909 Unspecified asthma, uncomplicated: Secondary | ICD-10-CM | POA: Diagnosis present

## 2020-11-20 DIAGNOSIS — Z7982 Long term (current) use of aspirin: Secondary | ICD-10-CM | POA: Diagnosis not present

## 2020-11-20 DIAGNOSIS — M21162 Varus deformity, not elsewhere classified, left knee: Secondary | ICD-10-CM | POA: Diagnosis present

## 2020-11-20 DIAGNOSIS — E782 Mixed hyperlipidemia: Secondary | ICD-10-CM | POA: Diagnosis present

## 2020-11-20 DIAGNOSIS — Z820 Family history of epilepsy and other diseases of the nervous system: Secondary | ICD-10-CM | POA: Diagnosis not present

## 2020-11-20 DIAGNOSIS — Z800 Family history of malignant neoplasm of digestive organs: Secondary | ICD-10-CM | POA: Diagnosis not present

## 2020-11-20 DIAGNOSIS — Z79890 Hormone replacement therapy (postmenopausal): Secondary | ICD-10-CM | POA: Diagnosis not present

## 2020-11-20 LAB — BASIC METABOLIC PANEL
Anion gap: 4 — ABNORMAL LOW (ref 5–15)
BUN: 12 mg/dL (ref 8–23)
CO2: 25 mmol/L (ref 22–32)
Calcium: 8.1 mg/dL — ABNORMAL LOW (ref 8.9–10.3)
Chloride: 109 mmol/L (ref 98–111)
Creatinine, Ser: 0.73 mg/dL (ref 0.44–1.00)
GFR, Estimated: >60 mL/min (ref >60)
Glucose, Bld: 114 mg/dL — ABNORMAL HIGH (ref 70–99)
Potassium: 3.3 mmol/L — ABNORMAL LOW (ref 3.5–5.1)
Sodium: 138 mmol/L (ref 135–145)

## 2020-11-20 NOTE — Plan of Care (Signed)
Plan of care reviewed and discussed with the patient. 

## 2020-11-20 NOTE — Progress Notes (Signed)
Physical Therapy Treatment Patient Details Name: Megan Valentine MRN: [id number] DOB: Oct 13, 1950 Today's Date: 11/20/2020   History of Present Illness 70 y.o. female admitted 11/17/20 for L TKA. PMH includes [name] 2017, hysterectomy, IBS, migraines, mitral valve prolapse, rectocele repair.    PT Comments    Pt reports she's had some nausea this morning. She had zofran earlier this morning. Min A for supine to sit and to pivot to bedside commode, then to recliner. Pt reported feeling lightheaded and nauseous with activity. Vitals are stable (see flowsheets). She declined ambulation. Performed L TKA exercises with min A. She is not progressing with mobility.     Recommendations for follow up therapy are one component of a multi-disciplinary discharge planning process, led by the attending physician.  Recommendations may be updated based on patient status, additional functional criteria and insurance authorization.  Follow Up Recommendations  Follow physician's recommendations for discharge plan and follow up therapies     Assistance Recommended at Discharge Intermittent Supervision/Assistance  Equipment Recommendations  None recommended by PT    Recommendations for Other Services       Precautions / Restrictions Precautions Precautions: Knee Restrictions Weight Bearing Restrictions: No Other Position/Activity Restrictions: WBAT     Mobility  Bed Mobility         Supine to sit: Min assist     General bed mobility comments: min A for LLE OOB, increased time, used gait belt as leg lifter    Transfers Overall transfer level: Needs assistance Equipment used: Rolling walker (2 wheels) Transfers: Sit to/from Stand;Stand Pivot Transfers Sit to Stand: Min assist Stand pivot transfers: Min assist         General transfer comment: pt nauseous in sitting, assisted to [facility]    Ambulation/Gait Ambulation/Gait assistance: Min assist Gait Distance (Feet): 2 Feet Assistive  device: Rolling walker (2 wheels) Gait Pattern/deviations: Step-to pattern;Decreased step length - left;Decreased weight shift to left;Trunk flexed Gait velocity: decr   General Gait Details: min A to steady and to power up, SPT x 2 bed to [facility] then to recliner, distance limited by nausea & lightheadedness. BP sitting 127/76, HR 108, 96% on room air.   Stairs             Wheelchair Mobility    Modified Rankin (Stroke Patients Only)       Balance Overall balance assessment: Needs assistance   Sitting balance-Leahy Scale: Good     Standing balance support: Bilateral upper extremity supported Standing balance-Leahy Scale: Poor                              Cognition Arousal/Alertness: Awake/alert Behavior During Therapy: WFL for tasks assessed/performed Overall Cognitive Status: Within Functional Limits for tasks assessed                                          Exercises Total Joint Exercises Ankle Circles/Pumps: AROM;Both;10 reps;Supine Quad Sets: AAROM;Supine;5 reps;Both Short Arc Quad: AAROM;Left;5 reps;Supine Heel Slides: AAROM;Left;Supine;Limitations;5 reps Heel Slides Limitations: very limited ROM, guards closely Goniometric ROM: 5-25* AAROM L knee, limited by pain    General Comments        Pertinent Vitals/Pain Pain Score: 5  Pain Location: L knee Pain Descriptors / Indicators: Aching;Sore;Operative site guarding Pain Intervention(s): Limited activity within patient's tolerance;Monitored during session;Premedicated before session;Ice applied  Home Living                          Prior Function            PT Goals (current goals can now be found in the care plan section) Acute Rehab PT Goals Patient Stated Goal: to walk for exercise PT Goal Formulation: With patient Time For Goal Achievement: 11/24/20 Potential to Achieve Goals: Good Progress towards PT goals: Progressing toward goals     Frequency    7X/week      PT Plan Current plan remains appropriate    Co-evaluation              AM-PAC PT "6 Clicks" Mobility   Outcome Measure  Help needed turning from your back to your side while in a flat bed without using bedrails?: A Little Help needed moving from lying on your back to sitting on the side of a flat bed without using bedrails?: A Little Help needed moving to and from a bed to a chair (including a wheelchair)?: A Little Help needed standing up from a chair using your arms (e.g., wheelchair or bedside chair)?: A Little Help needed to walk in hospital room?: A Lot Help needed climbing 3-5 steps with a railing? : Total 6 Click Score: 15    End of Session Equipment Utilized During Treatment: Gait belt Activity Tolerance: Patient limited by pain Patient left: with call bell/phone within reach;in chair;with chair alarm set Nurse Communication: Mobility status;Other (comment) (pt nauseous) PT Visit Diagnosis: Difficulty in walking, not elsewhere classified (R26.2);Pain;Muscle weakness (generalized) (M62.81) Pain - Right/Left: Left Pain - part of body: Knee     Time: 1607-3710 PT Time Calculation (min) (ACUTE ONLY): 24 min  Charges:  $Gait Training: 8-22 mins $Therapeutic Exercise: 8-22 mins                     Blondell Reveal Kistler PT 11/20/2020  Acute Rehabilitation Services Pager [phone] Office [phone]

## 2020-11-20 NOTE — Progress Notes (Signed)
Physical Therapy Treatment Patient Details Name: Megan Valentine MRN: [id number] DOB: October 10, 1950 Today's Date: 11/20/2020   History of Present Illness 70 y.o. female admitted 11/17/20 for L TKA. PMH includes [name] 2017, hysterectomy, IBS, migraines, mitral valve prolapse, rectocele repair.    PT Comments    Pt ambulated 35' with RW, no loss of balance. Reviewed HEP, she demonstrates good understanding. From a PT standpoint, she is ready to DC home.    Recommendations for follow up therapy are one component of a multi-disciplinary discharge planning process, led by the attending physician.  Recommendations may be updated based on patient status, additional functional criteria and insurance authorization.  Follow Up Recommendations  Follow physician's recommendations for discharge plan and follow up therapies     Assistance Recommended at Discharge Intermittent Supervision/Assistance  Equipment Recommendations  None recommended by PT    Recommendations for Other Services       Precautions / Restrictions Precautions Precautions: Knee Precaution Booklet Issued: Yes (comment) Precaution Comments: reviewed no pillow under knee Restrictions Weight Bearing Restrictions: No LLE Weight Bearing: Weight bearing as tolerated Other Position/Activity Restrictions: WBAT     Mobility  Bed Mobility   Bed Mobility: Sit to Supine     Supine to sit: Min assist Sit to supine: Modified independent (Device/Increase time)   General bed mobility comments: used gait belt as leg lifter    Transfers Overall transfer level: Needs assistance Equipment used: Rolling walker (2 wheels) Transfers: Sit to/from [facility] Sit to Stand: Min guard Stand pivot transfers: Min guard         General transfer comment: SPT to [facility]    Ambulation/Gait Ambulation/Gait assistance: Supervision Gait Distance (Feet): 50 Feet Assistive device: Rolling walker (2 wheels) Gait  Pattern/deviations: Step-to pattern;Decreased step length - left;Decreased weight shift to left;Trunk flexed Gait velocity: decr   General Gait Details: VCs sequencing, no loss of balance, distance limited by pain and fatigue.   Stairs  Pt declined stair training. Stated she can use 3 in 1 instead of using step to get into bathroom.           Wheelchair Mobility    Modified Rankin (Stroke Patients Only)       Balance Overall balance assessment: Needs assistance   Sitting balance-Leahy Scale: Good     Standing balance support: Bilateral upper extremity supported Standing balance-Leahy Scale: Poor                              Cognition Arousal/Alertness: Awake/alert Behavior During Therapy: WFL for tasks assessed/performed Overall Cognitive Status: Within Functional Limits for tasks assessed                                          Exercises Total Joint Exercises Ankle Circles/Pumps: AROM;Both;10 reps;Supine Quad Sets: AAROM;Supine;5 reps;Both Short Arc Quad: AAROM;Left;Supine;10 reps Heel Slides: AAROM;Left;Supine;Limitations;10 reps Hip ABduction/ADduction: AAROM;Left;10 reps;Supine Straight Leg Raises: AAROM;Left;Supine;5 reps Long Arc Quad: AAROM;Left;5 reps;Seated Knee Flexion: AAROM;Left;10 reps;Seated Goniometric ROM: 5-40* AAROM L knee    General Comments        Pertinent Vitals/Pain Pain Score: 5  Pain Location: L knee Pain Descriptors / Indicators: Aching;Sore;Operative site guarding Pain Intervention(s): Limited activity within patient's tolerance;Monitored during session;Premedicated before session;Ice applied    Home Living  Prior Function            PT Goals (current goals can now be found in the care plan section) Acute Rehab PT Goals Patient Stated Goal: to walk for exercise PT Goal Formulation: With patient Time For Goal Achievement: 11/24/20 Potential to Achieve  Goals: Good Progress towards PT goals: Progressing toward goals    Frequency    7X/week      PT Plan Current plan remains appropriate    Co-evaluation              AM-PAC PT "6 Clicks" Mobility   Outcome Measure  Help needed turning from your back to your side while in a flat bed without using bedrails?: A Little Help needed moving from lying on your back to sitting on the side of a flat bed without using bedrails?: A Little Help needed moving to and from a bed to a chair (including a wheelchair)?: A Little Help needed standing up from a chair using your arms (e.g., wheelchair or bedside chair)?: A Little Help needed to walk in hospital room?: A Little Help needed climbing 3-5 steps with a railing? : A Little 6 Click Score: 18    End of Session Equipment Utilized During Treatment: Gait belt Activity Tolerance: Patient limited by pain;Patient tolerated treatment well Patient left: with call bell/phone within reach;in bed Nurse Communication: Mobility status PT Visit Diagnosis: Difficulty in walking, not elsewhere classified (R26.2);Pain;Muscle weakness (generalized) (M62.81) Pain - Right/Left: Left Pain - part of body: Knee     Time: 1610-9604 PT Time Calculation (min) (ACUTE ONLY): 24 min  Charges:  $Gait Training: 8-22 mins $Therapeutic Exercise: 8-22 mins                     Blondell Reveal Kistler PT 11/20/2020  Acute Rehabilitation Services Pager [phone] Office [phone]

## 2020-11-20 NOTE — Progress Notes (Signed)
[facility] RNCM saw patient this morning in hospital. She states she had a much better night last night and feels ready to go home today. She was able to tolerate CPM during the night for several hours up to 60 degrees she states. Pain more tolerable. Will update MD and PA. Discussed plan to go home today. All prescribed home medications have been sent to her pharmacy per MD yesterday and all DME has been delivered prior to surgery. If any changes please contact Jamse Arn, [place].

## 2020-11-21 DIAGNOSIS — Z96652 Presence of left artificial knee joint: Secondary | ICD-10-CM | POA: Diagnosis not present

## 2020-11-21 DIAGNOSIS — J301 Allergic rhinitis due to pollen: Secondary | ICD-10-CM | POA: Diagnosis not present

## 2020-11-21 DIAGNOSIS — G629 Polyneuropathy, unspecified: Secondary | ICD-10-CM | POA: Diagnosis not present

## 2020-11-21 DIAGNOSIS — E782 Mixed hyperlipidemia: Secondary | ICD-10-CM | POA: Diagnosis not present

## 2020-11-21 DIAGNOSIS — K5900 Constipation, unspecified: Secondary | ICD-10-CM | POA: Diagnosis not present

## 2020-11-21 DIAGNOSIS — K589 Irritable bowel syndrome without diarrhea: Secondary | ICD-10-CM | POA: Diagnosis not present

## 2020-11-21 DIAGNOSIS — G4700 Insomnia, unspecified: Secondary | ICD-10-CM | POA: Diagnosis not present

## 2020-11-21 DIAGNOSIS — R32 Unspecified urinary incontinence: Secondary | ICD-10-CM | POA: Diagnosis not present

## 2020-11-21 DIAGNOSIS — K219 Gastro-esophageal reflux disease without esophagitis: Secondary | ICD-10-CM | POA: Diagnosis not present

## 2020-11-21 DIAGNOSIS — E039 Hypothyroidism, unspecified: Secondary | ICD-10-CM | POA: Diagnosis not present

## 2020-11-21 DIAGNOSIS — Z471 Aftercare following joint replacement surgery: Secondary | ICD-10-CM | POA: Diagnosis not present

## 2020-11-21 DIAGNOSIS — K449 Diaphragmatic hernia without obstruction or gangrene: Secondary | ICD-10-CM | POA: Diagnosis not present

## 2020-11-21 DIAGNOSIS — N1831 Chronic kidney disease, stage 3a: Secondary | ICD-10-CM | POA: Diagnosis not present

## 2020-11-21 DIAGNOSIS — K5790 Diverticulosis of intestine, part unspecified, without perforation or abscess without bleeding: Secondary | ICD-10-CM | POA: Diagnosis not present

## 2020-11-21 DIAGNOSIS — J45909 Unspecified asthma, uncomplicated: Secondary | ICD-10-CM | POA: Diagnosis not present

## 2020-11-21 DIAGNOSIS — J42 Unspecified chronic bronchitis: Secondary | ICD-10-CM | POA: Diagnosis not present

## 2020-11-21 DIAGNOSIS — D631 Anemia in chronic kidney disease: Secondary | ICD-10-CM | POA: Diagnosis not present

## 2020-11-21 DIAGNOSIS — G43909 Migraine, unspecified, not intractable, without status migrainosus: Secondary | ICD-10-CM | POA: Diagnosis not present

## 2020-11-21 DIAGNOSIS — Z8701 Personal history of pneumonia (recurrent): Secondary | ICD-10-CM | POA: Diagnosis not present

## 2020-11-21 DIAGNOSIS — I341 Nonrheumatic mitral (valve) prolapse: Secondary | ICD-10-CM | POA: Diagnosis not present

## 2020-11-21 DIAGNOSIS — Z7982 Long term (current) use of aspirin: Secondary | ICD-10-CM | POA: Diagnosis not present

## 2020-11-21 DIAGNOSIS — H269 Unspecified cataract: Secondary | ICD-10-CM | POA: Diagnosis not present

## 2020-11-21 DIAGNOSIS — G8929 Other chronic pain: Secondary | ICD-10-CM | POA: Diagnosis not present

## 2020-11-23 ENCOUNTER — Telehealth: Payer: Self-pay | Admitting: *Deleted

## 2020-11-23 NOTE — Telephone Encounter (Signed)
Transition Care Management Unsuccessful Follow-up Telephone Call  Date of discharge and from where:  Megan Valentine 11-20-2020  Attempts:  1st Attempt  Reason for unsuccessful TCM follow-up call:  Left voice message

## 2020-11-24 ENCOUNTER — Telehealth: Payer: Self-pay | Admitting: *Deleted

## 2020-11-24 ENCOUNTER — Encounter (HOSPITAL_COMMUNITY): Payer: Self-pay | Admitting: Orthopaedic Surgery

## 2020-11-24 ENCOUNTER — Encounter: Payer: Self-pay | Admitting: Orthopaedic Surgery

## 2020-11-24 ENCOUNTER — Telehealth: Payer: Self-pay | Admitting: Family Medicine

## 2020-11-24 DIAGNOSIS — G4700 Insomnia, unspecified: Secondary | ICD-10-CM | POA: Diagnosis not present

## 2020-11-24 DIAGNOSIS — K589 Irritable bowel syndrome without diarrhea: Secondary | ICD-10-CM | POA: Diagnosis not present

## 2020-11-24 DIAGNOSIS — J301 Allergic rhinitis due to pollen: Secondary | ICD-10-CM | POA: Diagnosis not present

## 2020-11-24 DIAGNOSIS — D631 Anemia in chronic kidney disease: Secondary | ICD-10-CM | POA: Diagnosis not present

## 2020-11-24 DIAGNOSIS — E782 Mixed hyperlipidemia: Secondary | ICD-10-CM | POA: Diagnosis not present

## 2020-11-24 DIAGNOSIS — J42 Unspecified chronic bronchitis: Secondary | ICD-10-CM | POA: Diagnosis not present

## 2020-11-24 DIAGNOSIS — Z471 Aftercare following joint replacement surgery: Secondary | ICD-10-CM | POA: Diagnosis not present

## 2020-11-24 DIAGNOSIS — N1831 Chronic kidney disease, stage 3a: Secondary | ICD-10-CM | POA: Diagnosis not present

## 2020-11-24 DIAGNOSIS — I341 Nonrheumatic mitral (valve) prolapse: Secondary | ICD-10-CM | POA: Diagnosis not present

## 2020-11-24 DIAGNOSIS — G43909 Migraine, unspecified, not intractable, without status migrainosus: Secondary | ICD-10-CM | POA: Diagnosis not present

## 2020-11-24 DIAGNOSIS — E039 Hypothyroidism, unspecified: Secondary | ICD-10-CM | POA: Diagnosis not present

## 2020-11-24 DIAGNOSIS — K5900 Constipation, unspecified: Secondary | ICD-10-CM | POA: Diagnosis not present

## 2020-11-24 DIAGNOSIS — Z7982 Long term (current) use of aspirin: Secondary | ICD-10-CM | POA: Diagnosis not present

## 2020-11-24 DIAGNOSIS — R32 Unspecified urinary incontinence: Secondary | ICD-10-CM | POA: Diagnosis not present

## 2020-11-24 DIAGNOSIS — G8929 Other chronic pain: Secondary | ICD-10-CM | POA: Diagnosis not present

## 2020-11-24 DIAGNOSIS — K219 Gastro-esophageal reflux disease without esophagitis: Secondary | ICD-10-CM | POA: Diagnosis not present

## 2020-11-24 DIAGNOSIS — Z8701 Personal history of pneumonia (recurrent): Secondary | ICD-10-CM | POA: Diagnosis not present

## 2020-11-24 DIAGNOSIS — G629 Polyneuropathy, unspecified: Secondary | ICD-10-CM | POA: Diagnosis not present

## 2020-11-24 DIAGNOSIS — J45909 Unspecified asthma, uncomplicated: Secondary | ICD-10-CM | POA: Diagnosis not present

## 2020-11-24 DIAGNOSIS — K449 Diaphragmatic hernia without obstruction or gangrene: Secondary | ICD-10-CM | POA: Diagnosis not present

## 2020-11-24 DIAGNOSIS — H269 Unspecified cataract: Secondary | ICD-10-CM | POA: Diagnosis not present

## 2020-11-24 DIAGNOSIS — K5790 Diverticulosis of intestine, part unspecified, without perforation or abscess without bleeding: Secondary | ICD-10-CM | POA: Diagnosis not present

## 2020-11-24 DIAGNOSIS — Z96652 Presence of left artificial knee joint: Secondary | ICD-10-CM | POA: Diagnosis not present

## 2020-11-24 NOTE — Telephone Encounter (Signed)
I have left pt a vm on 10/31 and 11/24/20 to give our practice a call back to schedule her a hospital follow up. She was discharged on 11/21/20 from [place] sent [place] a fax stating-we needed to schedule her a hosp f/up.

## 2020-11-24 NOTE — Telephone Encounter (Signed)
Ortho bundle attempts to contact patient for D/C and 1 week call. Patient discharged on Friday, 11/20/20. Prolonged hospitalization and weekend delayed calls. Will re-attempt later during the week.

## 2020-11-24 NOTE — Telephone Encounter (Signed)
Ortho bundle D/C and 7 day call combined.

## 2020-11-25 ENCOUNTER — Encounter: Payer: Self-pay | Admitting: Orthopaedic Surgery

## 2020-12-01 ENCOUNTER — Encounter: Payer: Self-pay | Admitting: Physical Therapy

## 2020-12-01 ENCOUNTER — Ambulatory Visit (INDEPENDENT_AMBULATORY_CARE_PROVIDER_SITE_OTHER): Payer: PPO | Admitting: Physician Assistant

## 2020-12-01 ENCOUNTER — Encounter: Payer: Self-pay | Admitting: Physician Assistant

## 2020-12-01 ENCOUNTER — Ambulatory Visit: Payer: PPO | Admitting: Physical Therapy

## 2020-12-01 ENCOUNTER — Telehealth: Payer: Self-pay | Admitting: *Deleted

## 2020-12-01 ENCOUNTER — Other Ambulatory Visit: Payer: Self-pay

## 2020-12-01 ENCOUNTER — Ambulatory Visit: Payer: Self-pay

## 2020-12-01 DIAGNOSIS — R600 Localized edema: Secondary | ICD-10-CM | POA: Diagnosis not present

## 2020-12-01 DIAGNOSIS — Z96652 Presence of left artificial knee joint: Secondary | ICD-10-CM | POA: Diagnosis not present

## 2020-12-01 DIAGNOSIS — M25662 Stiffness of left knee, not elsewhere classified: Secondary | ICD-10-CM | POA: Diagnosis not present

## 2020-12-01 DIAGNOSIS — M25562 Pain in left knee: Secondary | ICD-10-CM

## 2020-12-01 DIAGNOSIS — R262 Difficulty in walking, not elsewhere classified: Secondary | ICD-10-CM

## 2020-12-01 MED ORDER — METHOCARBAMOL 500 MG PO TABS: 500.0000 mg | ORAL_TABLET | Freq: Four times a day (QID) | ORAL | 0 refills | Status: DC | PRN

## 2020-12-01 MED ORDER — OXYCODONE HCL 5 MG PO TABS: 5.0000 mg | ORAL_TABLET | ORAL | 0 refills | Status: DC | PRN

## 2020-12-01 NOTE — Progress Notes (Signed)
Office Visit Note   Patient: Megan Valentine           Date of Birth: 05-Mar-1950           MRN: [id number] Visit Date: 12/01/2020              Requested by: Libby Maw, MD [address] [place],  [place] [address] PCP: Libby Maw, MD  Chief Complaint  Patient presents with   Left Knee - Follow-up    Left total knee arthroplasty 11/17/2020      HPI: Patient is a pleasant 70 year old woman who is 2 weeks status post left total knee arthroplasty.  She has had a similar procedure on the right 5 years ago.  Overall she is doing well.  She is requesting refills on her muscle relaxant and pain medication.  She has been working with physical therapy.  She denies fever, chills, or calf pain.  She did have 1 physical therapy session that created some more swelling but since then she has been doing better.  She is continuing to take aspirin twice daily  Assessment & Plan: Visit Diagnoses:  1. S/P total knee arthroplasty, left     Plan: Patient is 2-week status post left total knee arthroplasty.  She has fairly good motion with flexion to 90 degrees she is lacking about 2 degrees of full extension.  Her swelling is overall well controlled and has a well-healed surgical wound we removed the surgical staples today and Steri-Strips were placed.  She will follow-up in 2 weeks.  Sooner if she has any concerns  Follow-Up Instructions: No follow-ups on file.   Ortho Exam  Patient is alert, oriented, no adenopathy, well-dressed, normal affect, normal respiratory effort. Left knee well apposed wound edges well-healed surgical incision surgical staples are in place.  No surrounding erythema no effusion no ascending cellulitis.  She is she straightens to 2 degrees short of neutral flexes to almost 90 degrees.  Compartments of the lower leg are soft and compressible no pain with passive stretch of her ankle.  Good dorsiflexion plantarflexion distal pulses are  intact  Imaging: XR KNEE 3 VIEW LEFT  Result Date: 12/01/2020 Three-view radiographs of the left knee were obtained.  Findings consistent status post left total knee arthroplasty.  She has well-maintained alignment and components are well set.  Surgical staples in place joint spaces are congruent  No images are attached to the encounter.  Labs: Lab Results  Component Value Date   HGBA1C 5.7 02/18/2020   HGBA1C 5.8 08/15/2019   ESRSEDRATE 4 07/11/2019   ESRSEDRATE 17 09/16/2011   CRP <1.0 07/11/2019   REPTSTATUS 03/26/2015 FINAL 03/24/2015   CULT 8,000 COLONIES/mL INSIGNIFICANT GROWTH 03/24/2015     Lab Results  Component Value Date   ALBUMIN 4.1 09/30/2020   ALBUMIN 4.0 08/15/2019   ALBUMIN 2.6 (L) 12/17/2018    No results found for: MG Lab Results  Component Value Date   VD25OH 51.53 08/15/2019   VD25OH 33.23 01/04/2019   VD25OH 40.05 10/19/2017    No results found for: PREALBUMIN CBC EXTENDED Latest Ref Rng & Units 11/17/2020 11/04/2020 03/30/2020  WBC 4.0 - 10.5 K/uL 7.9 7.5 8.7  RBC 3.87 - 5.11 MIL/uL 4.16 4.49 4.51  HGB 12.0 - 15.0 g/dL 13.5 14.4 14.5  HCT 36.0 - 46.0 % 43.5 43.3 42.7  PLT 150 - 400 K/uL 317 345 356.0  NEUTROABS 1.4 - 7.7 K/uL - - -  LYMPHSABS 0.7 - 4.0 K/uL - - -  There is no height or weight on file to calculate BMI.  Orders:  Orders Placed This Encounter  Procedures   XR KNEE 3 VIEW LEFT   Meds ordered this encounter  Medications   oxyCODONE (OXY IR/ROXICODONE) 5 MG immediate release tablet    Sig: Take 1 tablet (5 mg total) by mouth every 4 (four) hours as needed for moderate pain (pain score 4-6).    Dispense:  30 tablet    Refill:  0   methocarbamol (ROBAXIN) 500 MG tablet    Sig: Take 1 tablet (500 mg total) by mouth every 6 (six) hours as needed for muscle spasms.    Dispense:  30 tablet    Refill:  0     Procedures: No procedures performed  Clinical Data: No additional findings.  ROS:  All other systems  negative, except as noted in the HPI. Review of Systems  Objective: Vital Signs: There were no vitals taken for this visit.  Specialty Comments:  No specialty comments available.  PMFS History: Patient Active Problem List   Diagnosis Date Noted   S/P TKR (total knee replacement) using cement, left 11/17/2020   Chronic pain of right knee 09/30/2020   Medication side effect 09/30/2020   Statin intolerance 06/30/2020   Trigger point of shoulder region, left 02/25/2020   Elevated glucose 08/15/2019   Anemia 02/15/2019   Abdominal pain 12/14/2018   Sinus pressure 11/12/2018   History of 2019 novel coronavirus disease (COVID-19) 11/12/2018   Pleurisy 06/05/2018   Enterocele 06/05/2018   Rectocele 05/23/2018   Vaginal vault prolapse 05/23/2018   SUI (stress urinary incontinence, female) 05/23/2018   Asthma 05/07/2018   Stage 3a chronic kidney disease ([place]) 01/15/2018   Edema 12/15/2017   Insomnia 11/20/2017   Seborrheic dermatitis 11/16/2017   Seasonal allergic rhinitis due to pollen 11/16/2017   Menopausal and female climacteric states 10/19/2017   Constipation 03/26/2015   Unilateral primary osteoarthritis, left knee 11/17/2014   Healthcare maintenance 04/25/2014   Hypokalemia 09/15/2011   Hypothyroidism 08/21/2008   Mixed hyperlipidemia 08/21/2008   Past Medical History:  Diagnosis Date   Allergy    Arthritis    Asthma    Cataract    Chronic bronchitis ([place])    Chronic kidney disease    stage 3, kidney infections   Complication of anesthesia    hard to wake up   Diverticulosis    Environmental allergies    GERD (gastroesophageal reflux disease)    Hiatal hernia    History of chicken pox    Hyperlipidemia    Hypothyroidism    IBS (irritable bowel syndrome)    Migraines    Mitral valve prolapse    Per pt . per Dr.Henry  [name].  does not have MVP   Neuromuscular disorder ([place])    neuropathy feet   Pneumonia    Rectal prolapse     Family History  Problem  Relation Age of Onset   Stomach cancer Maternal Grandmother    Hypertension Maternal Grandmother    Diabetes Maternal Grandmother    Stroke Maternal Grandmother    Cancer Maternal Grandmother        Stomach cancer   Colon cancer Paternal Grandfather    Heart attack Paternal Grandfather    Hyperlipidemia Mother    Diabetes Mother    Hypertension Mother    Alzheimer's disease Mother    Diabetes Father    Hyperlipidemia Father    Heart disease Father    Heart attack Father  Stroke Paternal Grandmother     Past Surgical History:  Procedure Laterality Date   ABDOMINAL HYSTERECTOMY  1999   APPENDECTOMY  1962   BREAST CYST ASPIRATION Bilateral    CHOLECYSTECTOMY N/A 12/17/2018   Procedure: LAPAROSCOPIC CHOLECYSTECTOMY;  Surgeon: Clovis Riley, MD;  Location: [facility];  Service: General;  Laterality: N/A;   EYE SURGERY     cataract   JOINT REPLACEMENT     rt knee scope   REFRACTIVE SURGERY  2000   both eyes   retrocele repair     and bladder sling   TOTAL KNEE ARTHROPLASTY Right 03/24/2015   Procedure: TOTAL KNEE ARTHROPLASTY;  Surgeon: [name], MD;  Location: [facility];  Service: Orthopedics;  Laterality: Right;   TOTAL KNEE ARTHROPLASTY Left 11/17/2020   Procedure: LEFT TOTAL KNEE ARTHROPLASTY;  Surgeon: [name], MD;  Location: [facility];  Service: Orthopedics;  Laterality: Left;   TUBAL LIGATION  1977   Social History   Occupational History   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

## 2020-12-01 NOTE — Therapy (Signed)
[facility] [place], [place], [address] Phone: [phone]   Fax:  [phone]  Physical Therapy Evaluation  Patient Details  Name: Megan Valentine MRN: [id number] Date of Birth: [birth year] Referring Provider (PT): Joni Fears, MD   Encounter Date: 12/01/2020   PT End of Session - 12/01/20 1427     Visit Number 1    Number of Visits 16    Date for PT Re-Evaluation 01/29/21    Authorization Type [employer]    Progress Note Due on Visit 10    PT Start Time 1350    PT Stop Time 1428    PT Time Calculation (min) 38 min    Activity Tolerance Patient limited by pain;Patient tolerated treatment well    Behavior During Therapy WFL for tasks assessed/performed             Past Medical History:  Diagnosis Date   Allergy    Arthritis    Asthma    Cataract    Chronic bronchitis ([place])    Chronic kidney disease    stage 3, kidney infections   Complication of anesthesia    hard to wake up   Diverticulosis    Environmental allergies    GERD (gastroesophageal reflux disease)    Hiatal hernia    History of chicken pox    Hyperlipidemia    Hypothyroidism    IBS (irritable bowel syndrome)    Migraines    Mitral valve prolapse    Per pt . per Dr.Henry  [name].  does not have MVP   Neuromuscular disorder ([place])    neuropathy feet   Pneumonia    Rectal prolapse     Past Surgical History:  Procedure Laterality Date   ABDOMINAL HYSTERECTOMY  1999   APPENDECTOMY  1962   BREAST CYST ASPIRATION Bilateral    CHOLECYSTECTOMY N/A 12/17/2018   Procedure: LAPAROSCOPIC CHOLECYSTECTOMY;  Surgeon: Clovis Riley, MD;  Location: [facility];  Service: General;  Laterality: N/A;   EYE SURGERY     cataract   JOINT REPLACEMENT     rt knee scope   REFRACTIVE SURGERY  2000   both eyes   retrocele repair     and bladder sling   TOTAL KNEE ARTHROPLASTY Right 03/24/2015   Procedure: TOTAL KNEE ARTHROPLASTY;  Surgeon: [name], MD;  Location: [facility];  Service: Orthopedics;  Laterality: Right;   TOTAL KNEE ARTHROPLASTY Left 11/17/2020   Procedure: LEFT TOTAL KNEE ARTHROPLASTY;  Surgeon: [name], MD;  Location[facility];  Service: Orthopedics;  Laterality: Left;   TUBAL LIGATION  1977    There were no vitals filed for this visit.    Subjective Assessment - 12/01/20 1356     Subjective Pt arriving to therapy s/p left TKA on 11/17/2020 by Dr. Durward Fortes. Pt reporting difficutly with stepping into her home. Pt amb with straight cane. Pt stating she has help at home currently from her boy friend. Pt stating she has a walker for community amb. Pt reported receiving HHPT 5 visits.    Pertinent History arthritis, asthma, cataract, CKD, GERD, mitral valve prolapse, neuromusclular disorder feet, IBS, diverticulosis, hysterectomy 1999, joint replacement right knee 2017    Diagnostic tests X-ray    Patient Stated Goals Walk without device,    Currently in Pain? Yes    Pain Score 4     Pain Location Knee    Pain Orientation Left    Pain Descriptors / Indicators Aching;Sore;Tightness  Pain Type Surgical pain    Pain Onset 1 to 4 weeks ago    Pain Frequency Constant    Aggravating Factors  bending, walking, dependent position    Pain Relieving Factors pain meds, ice    Effect of Pain on Daily Activities diffculty sleeping, stepping into home, walking with device                [facility] PT Assessment - 12/01/20 0001       Assessment   Medical Diagnosis M17.12    Referring Provider (PT) Joni Fears, MD    Hand Dominance Right    Prior Therapy HHPT after surgery      Precautions   Precautions None      Restrictions   Weight Bearing Restrictions No      Balance Screen   Has the patient fallen in the past 6 months No    Is the patient reluctant to leave their home because of a fear of falling?  No      Home Social worker Private residence    Living Arrangements Alone     Available Help at Discharge Friend(s)    Type of [place] to enter    Entrance Stairs-Number of Steps 1      Prior Function   Level of Independence Independent with basic ADLs    Vocation Retired    Leisure walking dog, work in yard      Cognition   Overall Cognitive Status Within [place] for tasks assessed      Observation/Other Assessments   Focus on Therapeutic Outcomes (FOTO)  41% (predicted 60%)      Posture/Postural Control   Posture/Postural Control Postural limitations    Postural Limitations Rounded Shoulders;Forward head      ROM / Strength   AROM / PROM / Strength AROM;Strength;PROM      AROM   AROM Assessment Site Knee    Right/Left Knee Right;Left    Right Knee Extension 0    Right Knee Flexion 116    Left Knee Extension -12    Left Knee Flexion 80      PROM   PROM Assessment Site Knee    Right/Left Knee Left    Left Knee Extension 10    Left Knee Flexion 85      Strength   Strength Assessment Site Knee    Right/Left Knee Right;Left    Right Knee Flexion 5/5    Right Knee Extension 5/5    Left Knee Flexion 3/5    Left Knee Extension 3/5      Palpation   Patella mobility limited due to swelling      Transfers   Five time sit to stand comments  18 seconds with UE support      Ambulation/Gait   Assistive device Straight cane    Gait Pattern Step-through pattern;Decreased stance time - left;Decreased stride length;Decreased hip/knee flexion - left;Antalgic                        Objective measurements completed on examination: See above findings.                PT Education - 12/01/20 1420     Education Details PT POC, HEP    Person(s) Educated Patient    Methods Explanation;Demonstration;Tactile cues;Verbal cues;Handout    Comprehension Verbalized understanding;Returned demonstration  PT Short Term Goals - 12/01/20 1432       PT SHORT TERM GOAL #1   Title  Pt will be independent in her initial  HEP.    Time 3    Period Weeks    Status New    Target Date 12/25/20      PT SHORT TERM GOAL #2   Title Pt will improve her 5 time sit to stand to </= 14 seconds with no UE support.    Time 4    Period Weeks    Status New    Target Date 01/01/21               PT Long Term Goals - 12/01/20 1454       PT LONG TERM GOAL #1   Title Pt will be indepedent in advanced HEP.    Time 8    Period Weeks    Status New    Target Date 01/29/21      PT LONG TERM GOAL #2   Title Pt will improve her left knee AROM arc from 2-115 degrees.    Time 8    Period Weeks    Status New    Target Date 01/29/21      PT LONG TERM GOAL #3   Title Pt will be able to navigate 1 flight of stairs step over step with single hand rail.    Time 8    Period Weeks    Status New      PT LONG TERM GOAL #4   Title Pt will be able to report pain </= 2/10 with ADL's and home chores.    Time 8    Period Weeks    Status New    Target Date 01/29/21      PT LONG TERM GOAL #5   Title Pt will improve her FOTO to >/= 60%.    Time 8    Period Weeks    Status New    Target Date 01/29/21                    Plan - 12/01/20 1421     Clinical Impression Statement Pt arriving to therapy s/p left TKA on 11/17/2020. Pt amb with straight cane with antalgic gait pattern with decreased weight shift to left LE. pt also with decreased heel strike and incresaed hip and knee flexion. Pt presenting with 4/10 pain and swelling noted with limited active and passive ROM. Pt with decreased strength and reporting difficulities with ADL's. Skilled PT needed to address pt's impairments with the below interventions.    Personal Factors and Comorbidities Comorbidity 3+    Comorbidities arthritis, asthma, cataract, CKD, GERD, mitral valve prolapse, neuromusclular disorder feet, IBS, diverticulosis, hysterectomy 1999, joint replacement right knee 2017    Examination-Activity  Limitations Lift;Transfers;Other;Sit;Squat;Stand;Stairs;Dressing    Examination-Participation Restrictions Community Activity;Other;Yard Work;Driving    Stability/Clinical Decision Making Stable/Uncomplicated    Clinical Decision Making Low    Rehab Potential Good    PT Frequency 2x / week    PT Duration 8 weeks    PT Treatment/Interventions ADLs/Self Care Home Management;Cryotherapy;Electrical Stimulation;Ultrasound;Gait training;Stair training;Functional mobility training;Therapeutic activities;Therapeutic exercise;Balance training;Neuromuscular re-education;Patient/family education;Passive range of motion;Dry needling;Taping;Manual techniques;Vasopneumatic Device    PT Next Visit Plan Begin on Nustep, LE strenghtening, ROM gait training progressing off st cane as appropriate    PT Home Exercise Plan Access Code: [email or web address]  URL: [email or web address]  Date: 12/01/2020  Prepared by: Kearney Hard  Exercises  Long Sitting Quad Set with Towel Roll Under Heel - 3 x daily - 7 x weekly - 2 sets - 10 reps - 5 seconds hold  Supine Active Straight Leg Raise - 3 x daily - 7 x weekly - 2 sets - 10 reps  Supine Bridge - 3 x daily - 7 x weekly - 2 sets - 10 reps  Supine Heel Slide with Strap - 3 x daily - 7 x weekly - 10 reps - 10 second hold  Seated Long Arc Quad - 3 x daily - 7 x weekly - 2 sets - 10 reps - 3-5 seconds hold  Seated Knee Flexion AAROM - 3 x daily - 7 x weekly - 10 reps - 10 seconds hold    Consulted and Agree with Plan of Care Patient             Patient will benefit from skilled therapeutic intervention in order to improve the following deficits and impairments:  Decreased mobility, Decreased strength, Postural dysfunction, Decreased balance, Impaired flexibility, Pain, Decreased activity tolerance, Difficulty walking, Abnormal gait  Visit Diagnosis: Acute pain of left knee  Difficulty in walking, not elsewhere classified  Localized edema  Stiffness of left knee,  not elsewhere classified     Problem List Patient Active Problem List   Diagnosis Date Noted   S/P TKR (total knee replacement) using cement, left 11/17/2020   Chronic pain of right knee 09/30/2020   Medication side effect 09/30/2020   Statin intolerance 06/30/2020   Trigger point of shoulder region, left 02/25/2020   Elevated glucose 08/15/2019   Anemia 02/15/2019   Abdominal pain 12/14/2018   Sinus pressure 11/12/2018   History of 2019 novel coronavirus disease (COVID-19) 11/12/2018   Pleurisy 06/05/2018   Enterocele 06/05/2018   Rectocele 05/23/2018   Vaginal vault prolapse 05/23/2018   SUI (stress urinary incontinence, female) 05/23/2018   Asthma 05/07/2018   Stage 3a chronic kidney disease ([place]) 01/15/2018   Edema 12/15/2017   Insomnia 11/20/2017   Seborrheic dermatitis 11/16/2017   Seasonal allergic rhinitis due to pollen 11/16/2017   Menopausal and female climacteric states 10/19/2017   Constipation 03/26/2015   Unilateral primary osteoarthritis, left knee 11/17/2014   Healthcare maintenance 04/25/2014   Hypokalemia 09/15/2011   Hypothyroidism 08/21/2008   Mixed hyperlipidemia 08/21/2008    Oretha Caprice, PT, MPT 12/01/2020, 2:57 PM  [facility] [address] [place], [place], [address] Phone: [phone]   Fax:  [phone]  Name: Megan Valentine MRN: [id number] Date of Birth: November 14, 1950

## 2020-12-01 NOTE — Telephone Encounter (Signed)
Ortho bundle 14 day in office appointment completed.

## 2020-12-02 ENCOUNTER — Encounter: Payer: Self-pay | Admitting: Physical Therapy

## 2020-12-02 ENCOUNTER — Ambulatory Visit: Payer: PPO | Admitting: Physical Therapy

## 2020-12-02 DIAGNOSIS — R600 Localized edema: Secondary | ICD-10-CM | POA: Diagnosis not present

## 2020-12-02 DIAGNOSIS — M25662 Stiffness of left knee, not elsewhere classified: Secondary | ICD-10-CM | POA: Diagnosis not present

## 2020-12-02 DIAGNOSIS — R262 Difficulty in walking, not elsewhere classified: Secondary | ICD-10-CM

## 2020-12-02 DIAGNOSIS — M25562 Pain in left knee: Secondary | ICD-10-CM

## 2020-12-02 NOTE — Therapy (Signed)
[place], [place], [address] Phone: [phone]   Fax:  [phone]  Physical Therapy Treatment  Patient Details  Name: Megan Valentine MRN: [id number] Date of Birth: 08-Mar-1950 Referring Provider (PT): Joni Fears, MD   Encounter Date: 12/02/2020   PT End of Session - 12/02/20 1503     Visit Number 2    Number of Visits 16    Date for PT Re-Evaluation 01/29/21    Authorization Type [employer]    Progress Note Due on Visit 10    PT Start Time 1430    PT Stop Time 1512    PT Time Calculation (min) 42 min    Activity Tolerance Patient limited by pain;Patient tolerated treatment well    Behavior During Therapy WFL for tasks assessed/performed             Past Medical History:  Diagnosis Date   Allergy    Arthritis    Asthma    Cataract    Chronic bronchitis ([place])    Chronic kidney disease    stage 3, kidney infections   Complication of anesthesia    hard to wake up   Diverticulosis    Environmental allergies    GERD (gastroesophageal reflux disease)    Hiatal hernia    History of chicken pox    Hyperlipidemia    Hypothyroidism    IBS (irritable bowel syndrome)    Migraines    Mitral valve prolapse    Per pt . per Dr.Henry  [name].  does not have MVP   Neuromuscular disorder ([place])    neuropathy feet   Pneumonia    Rectal prolapse     Past Surgical History:  Procedure Laterality Date   ABDOMINAL HYSTERECTOMY  1999   APPENDECTOMY  1962   BREAST CYST ASPIRATION Bilateral    CHOLECYSTECTOMY N/A 12/17/2018   Procedure: LAPAROSCOPIC CHOLECYSTECTOMY;  Surgeon: Clovis Riley, MD;  Location: [facility];  Service: General;  Laterality: N/A;   EYE SURGERY     cataract   JOINT REPLACEMENT     rt knee scope   REFRACTIVE SURGERY  2000   both eyes   retrocele repair     and bladder sling   TOTAL KNEE ARTHROPLASTY Right 03/24/2015   Procedure: TOTAL KNEE ARTHROPLASTY;  Surgeon: [name], MD;  Location: [facility];  Service: Orthopedics;  Laterality: Right;   TOTAL KNEE ARTHROPLASTY Left 11/17/2020   Procedure: LEFT TOTAL KNEE ARTHROPLASTY;  Surgeon: [name], MD;  Location: [facility];  Service: Orthopedics;  Laterality: Left;   TUBAL LIGATION  1977    There were no vitals filed for this visit.   Subjective Assessment - 12/02/20 1432     Subjective "I'm getting better each day."    Pertinent History arthritis, asthma, cataract, CKD, GERD, mitral valve prolapse, neuromusclular disorder feet, IBS, diverticulosis, hysterectomy 1999, joint replacement right knee 2017    Diagnostic tests X-ray    Patient Stated Goals Walk without device,    Currently in Pain? Yes    Pain Score 4     Pain Location Knee    Pain Orientation Left    Pain Descriptors / Indicators Aching;Sore;Tightness    Pain Type Surgical pain    Pain Onset 1 to 4 weeks ago    Pain Frequency Constant    Aggravating Factors  bending, walking, dependent position    Pain Relieving Factors pain meds, ice  [facility] PT Assessment - 12/02/20 1456       Assessment   Medical Diagnosis M17.12      PROM   Left Knee Flexion 92                           [facility] Adult PT Treatment/Exercise - 12/02/20 1433       Exercises   Exercises Knee/Hip      Knee/Hip Exercises: Stretches   Knee: Self-Stretch Limitations LLE 10 x 10 sec; overpressure from RLE      Knee/Hip Exercises: Aerobic   Nustep L5 x 8 min      Knee/Hip Exercises: Seated   Long Arc Quad Left;2 sets;10 reps    Long Arc Quad Limitations 3 sec hold      Knee/Hip Exercises: Supine   Quad Sets Left;20 reps    Quad Sets Limitations best quad activation in long sitting    Heel Slides AAROM;Left;10 reps    Bridges 10 reps    Straight Leg Raises Left;10 reps    Straight Leg Raises Limitations quad lag present      Modalities   Modalities Vasopneumatic      Vasopneumatic   Number Minutes  Vasopneumatic  10 minutes    Vasopnuematic Location  Knee    Vasopneumatic Pressure Medium    Vasopneumatic Temperature  34      Manual Therapy   Manual therapy comments seated Lt knee flexion with Rt LAQ - gentle PROM                       PT Short Term Goals - 12/01/20 1432       PT SHORT TERM GOAL #1   Title Pt will be independent in her initial  HEP.    Time 3    Period Weeks    Status New    Target Date 12/25/20      PT SHORT TERM GOAL #2   Title Pt will improve her 5 time sit to stand to </= 14 seconds with no UE support.    Time 4    Period Weeks    Status New    Target Date 01/01/21               PT Long Term Goals - 12/01/20 1454       PT LONG TERM GOAL #1   Title Pt will be indepedent in advanced HEP.    Time 8    Period Weeks    Status New    Target Date 01/29/21      PT LONG TERM GOAL #2   Title Pt will improve her left knee AROM arc from 2-115 degrees.    Time 8    Period Weeks    Status New    Target Date 01/29/21      PT LONG TERM GOAL #3   Title Pt will be able to navigate 1 flight of stairs step over step with single hand rail.    Time 8    Period Weeks    Status New      PT LONG TERM GOAL #4   Title Pt will be able to report pain </= 2/10 with ADL's and home chores.    Time 8    Period Weeks    Status New    Target Date 01/29/21      PT LONG TERM GOAL #5   Title Pt will  improve her FOTO to >/= 60%.    Time 8    Period Weeks    Status New    Target Date 01/29/21                   Plan - 12/02/20 1503     Clinical Impression Statement Pt tolerated session well today demonstrating improvement in passive Lt knee flexion from yesterday's eval.  Review of HEP today needing mod cues for technique, and does best with quad set in long sitting to visualize quad contraction.  Will continue to benefit from PT to maximize function.    Personal Factors and Comorbidities Comorbidity 3+    Comorbidities arthritis,  asthma, cataract, CKD, GERD, mitral valve prolapse, neuromusclular disorder feet, IBS, diverticulosis, hysterectomy 1999, joint replacement right knee 2017    Examination-Activity Limitations Lift;Transfers;Other;Sit;Squat;Stand;Stairs;Dressing    Examination-Participation Restrictions Community Activity;Other;Yard Work;Driving    Stability/Clinical Decision Making Stable/Uncomplicated    Rehab Potential Good    PT Frequency 2x / week    PT Duration 8 weeks    PT Treatment/Interventions ADLs/Self Care Home Management;Cryotherapy;Electrical Stimulation;Ultrasound;Gait training;Stair training;Functional mobility training;Therapeutic activities;Therapeutic exercise;Balance training;Neuromuscular re-education;Patient/family education;Passive range of motion;Dry needling;Taping;Manual techniques;Vasopneumatic Device    PT Next Visit Plan Begin on Nustep, LE strenghtening, ROM gait training progressing off st cane as appropriate; needs aggressive ROM if she can tolerate    PT Home Exercise Plan Access Code: [email or web address]    Consulted and Agree with Plan of Care Patient             Patient will benefit from skilled therapeutic intervention in order to improve the following deficits and impairments:  Decreased mobility, Decreased strength, Postural dysfunction, Decreased balance, Impaired flexibility, Pain, Decreased activity tolerance, Difficulty walking, Abnormal gait  Visit Diagnosis: Acute pain of left knee  Difficulty in walking, not elsewhere classified  Localized edema  Stiffness of left knee, not elsewhere classified     Problem List Patient Active Problem List   Diagnosis Date Noted   S/P TKR (total knee replacement) using cement, left 11/17/2020   Chronic pain of right knee 09/30/2020   Medication side effect 09/30/2020   Statin intolerance 06/30/2020   Trigger point of shoulder region, left 02/25/2020   Elevated glucose 08/15/2019   Anemia 02/15/2019   Abdominal pain  12/14/2018   Sinus pressure 11/12/2018   History of 2019 novel coronavirus disease (COVID-19) 11/12/2018   Pleurisy 06/05/2018   Enterocele 06/05/2018   Rectocele 05/23/2018   Vaginal vault prolapse 05/23/2018   SUI (stress urinary incontinence, female) 05/23/2018   Asthma 05/07/2018   Stage 3a chronic kidney disease ([place]) 01/15/2018   Edema 12/15/2017   Insomnia 11/20/2017   Seborrheic dermatitis 11/16/2017   Seasonal allergic rhinitis due to pollen 11/16/2017   Menopausal and female climacteric states 10/19/2017   Constipation 03/26/2015   Unilateral primary osteoarthritis, left knee 11/17/2014   Healthcare maintenance 04/25/2014   Hypokalemia 09/15/2011   Hypothyroidism 08/21/2008   Mixed hyperlipidemia 08/21/2008   Laureen Abrahams, PT, DPT 12/02/20 3:06 PM    [facility] [address] [place], [place], [address] Phone: [phone]   Fax:  [phone]  Name: RAGUEL KOSLOSKI MRN: [id number] Date of Birth: 03-14-1950

## 2020-12-07 ENCOUNTER — Other Ambulatory Visit: Payer: Self-pay

## 2020-12-07 ENCOUNTER — Encounter: Payer: Self-pay | Admitting: Family Medicine

## 2020-12-07 ENCOUNTER — Ambulatory Visit (INDEPENDENT_AMBULATORY_CARE_PROVIDER_SITE_OTHER): Payer: PPO | Admitting: Family Medicine

## 2020-12-07 VITALS — BP 132/84 | HR 101 | Temp 97.0°F | Resp 18 | Wt 155.8 lb

## 2020-12-07 DIAGNOSIS — D7589 Other specified diseases of blood and blood-forming organs: Secondary | ICD-10-CM | POA: Diagnosis not present

## 2020-12-07 DIAGNOSIS — E039 Hypothyroidism, unspecified: Secondary | ICD-10-CM | POA: Diagnosis not present

## 2020-12-07 DIAGNOSIS — E876 Hypokalemia: Secondary | ICD-10-CM

## 2020-12-07 DIAGNOSIS — R7309 Other abnormal glucose: Secondary | ICD-10-CM | POA: Diagnosis not present

## 2020-12-07 LAB — URINALYSIS, ROUTINE W REFLEX MICROSCOPIC
Bilirubin Urine: NEGATIVE
Hgb urine dipstick: NEGATIVE
Ketones, ur: NEGATIVE
Leukocytes,Ua: NEGATIVE
Nitrite: NEGATIVE
RBC / HPF: NONE SEEN (ref 0–?)
Specific Gravity, Urine: 1.025 (ref 1.000–1.030)
Total Protein, Urine: NEGATIVE
Urine Glucose: NEGATIVE
Urobilinogen, UA: 0.2 (ref 0.0–1.0)
pH: 6 (ref 5.0–8.0)

## 2020-12-07 LAB — BASIC METABOLIC PANEL
BUN: 12 mg/dL (ref 6–23)
CO2: 28 mEq/L (ref 19–32)
Calcium: 9 mg/dL (ref 8.4–10.5)
Chloride: 105 mEq/L (ref 96–112)
Creatinine, Ser: 0.98 mg/dL (ref 0.40–1.20)
GFR: 58.39 mL/min — ABNORMAL LOW (ref >60.00)
Glucose, Bld: 130 mg/dL — ABNORMAL HIGH (ref 70–99)
Potassium: 2.9 mEq/L — ABNORMAL LOW (ref 3.5–5.1)
Sodium: 143 mEq/L (ref 135–145)

## 2020-12-07 LAB — VITAMIN B12: Vitamin B-12: 1108 pg/mL — ABNORMAL HIGH (ref 211–911)

## 2020-12-07 LAB — HEMOGLOBIN A1C: Hgb A1c MFr Bld: 5.6 % (ref 4.6–6.5)

## 2020-12-07 MED ORDER — POTASSIUM CHLORIDE CRYS ER 20 MEQ PO TBCR: 20.0000 meq | EXTENDED_RELEASE_TABLET | Freq: Every day | ORAL | 3 refills | Status: DC

## 2020-12-07 NOTE — Addendum Note (Signed)
Addended by: Jon Billings on: 12/07/2020 04:19 PM   Modules accepted: Orders

## 2020-12-07 NOTE — Progress Notes (Addendum)
Established Patient Office Visit  Subjective:  Patient ID: Megan Valentine, female    DOB: [birth year]  Age: 70 y.o. MRN: [id number]  CC:  Chief Complaint  Patient presents with   [facility] follow up, pt has blood work done at hospital tha had concerning liver levels     HPI Megan Valentine presents for hospital discharge follow-up status post left total knee replacement on the 25th of last month.  Still trying to improve her range of motion and is enduring lingering pain.  She is ambulating and is overall improving.  Is concerned about blood work drawn in the hospital that showed elevated glucose and low potassium.  She is not taking Lasix for several weeks now.  She is concerned about her elevated MCV.  She has no history of anemia.  Levothyroxine recently increased to 112 mcg from 100 secondary to elevated TSH.  She feels better on this higher dose.  Constipation has improved.  Past Medical History:  Diagnosis Date   Allergy    Arthritis    Asthma    Cataract    Chronic bronchitis ([place])    Chronic kidney disease    stage 3, kidney infections   Complication of anesthesia    hard to wake up   Diverticulosis    Environmental allergies    GERD (gastroesophageal reflux disease)    Hiatal hernia    History of chicken pox    Hyperlipidemia    Hypothyroidism    IBS (irritable bowel syndrome)    Migraines    Mitral valve prolapse    Per pt . per Dr.Henry  [name].  does not have MVP   Neuromuscular disorder ([place])    neuropathy feet   Pneumonia    Rectal prolapse     Past Surgical History:  Procedure Laterality Date   ABDOMINAL HYSTERECTOMY  1999   APPENDECTOMY  1962   BREAST CYST ASPIRATION Bilateral    CHOLECYSTECTOMY N/A 12/17/2018   Procedure: LAPAROSCOPIC CHOLECYSTECTOMY;  Surgeon: Clovis Riley, MD;  Location: [facility];  Service: General;  Laterality: N/A;   EYE SURGERY     cataract   JOINT REPLACEMENT     rt knee scope   REFRACTIVE SURGERY  2000    both eyes   retrocele repair     and bladder sling   TOTAL KNEE ARTHROPLASTY Right 03/24/2015   Procedure: TOTAL KNEE ARTHROPLASTY;  Surgeon: [name], MD;  Location: [facility];  Service: Orthopedics;  Laterality: Right;   TOTAL KNEE ARTHROPLASTY Left 11/17/2020   Procedure: LEFT TOTAL KNEE ARTHROPLASTY;  Surgeon: [name], MD;  Location: [facility];  Service: Orthopedics;  Laterality: Left;   TUBAL LIGATION  1977    Family History  Problem Relation Age of Onset   Stomach cancer Maternal Grandmother    Hypertension Maternal Grandmother    Diabetes Maternal Grandmother    Stroke Maternal Grandmother    Cancer Maternal Grandmother        Stomach cancer   Colon cancer Paternal Grandfather    Heart attack Paternal Grandfather    Hyperlipidemia Mother    Diabetes Mother    Hypertension Mother    Alzheimer's disease Mother    Diabetes Father    Hyperlipidemia Father    Heart disease Father    Heart attack Father    Stroke Paternal Grandmother     Social History   Socioeconomic History   Marital status: Widowed    Spouse  name: Not on file   Number of children: Not on file   Years of education: 12   Highest education level: Not on file  Occupational History   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  Other Topics Concern   Not on file  Social History Narrative   Caffeine Use-yes   Regular exercise-no   Right handed    Lives alone   Social Determinants of Health   Financial Resource Strain: Not on file  Food Insecurity: Not on file  Transportation Needs: Not on file  Physical Activity: Not on file   Stress: Not on file  Social Connections: Not on file  Intimate Partner Violence: Not on file    Outpatient Medications Prior to Visit  Medication Sig Dispense Refill   acetaminophen (TYLENOL) 500 MG tablet Take 1 tablet (500 mg total) by mouth every 6 (six) hours. 30 tablet 0   aspirin 81 MG chewable tablet Chew 1 tablet (81 mg total) by mouth 2 (two) times daily.     estradiol (ESTRACE) 0.5 MG tablet TAKE 1 TABLET(0.5 MG) BY MOUTH DAILY 90 tablet 2   levothyroxine (SYNTHROID) 112 MCG tablet Take 1 tablet (112 mcg total) by mouth daily. 90 tablet 0   methocarbamol (ROBAXIN) 500 MG tablet Take 1 tablet (500 mg total) by mouth every 6 (six) hours as needed for muscle spasms. 30 tablet 0   oxyCODONE (OXY IR/ROXICODONE) 5 MG immediate release tablet Take 1 tablet (5 mg total) by mouth every 4 (four) hours as needed for moderate pain (pain score 4-6). 30 tablet 0   albuterol (VENTOLIN HFA) 108 (90 Base) MCG/ACT inhaler INHALE 2 PUFFS INTO THE LUNGS EVERY 6 HOURS AS NEEDED FOR WHEEZING OR SHORTNESS OF BREATH (Patient not taking: Reported on 12/07/2020) 8.5 g 2   APPLE CIDER VINEGAR PO Take 2 tablets by mouth in the morning. gummies (Patient not taking: Reported on 12/07/2020)     Biotin 5000 MCG TABS Take 5,000 mcg by mouth in the morning. (Patient not taking: Reported on 12/07/2020)     Brimonidine Tartrate (LUMIFY) 0.025 % SOLN Place 1 drop into both eyes 2 (two) times daily. (Patient not taking: Reported on 12/07/2020)     Cholecalciferol (VITAMIN D-3) 125 MCG (5000 UT) TABS Take 5,000 Units by mouth in the morning. (Patient not taking: Reported on 12/07/2020)     CRANBERRY PO Take 15,000 mg by mouth in the morning. (Patient not taking: Reported on 12/07/2020)     Cyanocobalamin (VITAMIN B-12) 5000 MCG TBDP Take 5,000 mcg by mouth in the morning. (Patient not taking: Reported on 12/07/2020)     docusate sodium (COLACE) 100 MG capsule Take 100 mg by mouth at bedtime. (Patient not taking: Reported  on 12/07/2020)     fluticasone (FLONASE) 50 MCG/ACT nasal spray Place 1 spray into both nostrils daily as needed for allergies or rhinitis. (Patient not taking: Reported on 12/07/2020)     fluticasone (FLOVENT HFA) 44 MCG/ACT inhaler Inhale 2 puffs into the lungs in the morning and at bedtime. (Patient not taking: Reported  on 12/07/2020) 1 each 12   Ginkgo Biloba 120 MG CAPS Take 120 mg by mouth in the morning.     hydroxypropyl methylcellulose / hypromellose (ISOPTO TEARS / GONIOVISC) 2.5 % ophthalmic solution Place 1 drop into both eyes 4 (four) times daily as needed for dry eyes.     loratadine (CLARITIN) 10 MG tablet Take 10 mg by mouth daily as needed for allergies.     Multiple Vitamins-Minerals (HAIR/SKIN/NAILS/BIOTIN PO) Take 3 tablets by mouth in the morning.     ondansetron (ZOFRAN) 4 MG tablet Take 1 tablet (4 mg total) by mouth every 6 (six) hours as needed for nausea. 20 tablet 0   pantoprazole (PROTONIX) 20 MG tablet TAKE 1 TABLET(20 MG) BY MOUTH DAILY (Patient not taking: Reported on 12/07/2020) 90 tablet 1   Probiotic Product (PROBIOTIC PO) Take 4 capsules by mouth in the morning. (Patient not taking: Reported on 12/07/2020)     Sennosides 17.2 MG TABS Take 17.2 mg by mouth at bedtime. (Patient not taking: Reported on 12/07/2020)     Zinc 50 MG TABS Take 50 mg by mouth in the morning. (Patient not taking: Reported on 12/07/2020)     furosemide (LASIX) 20 MG tablet Take 1 tablet (20 mg total) by mouth daily. (Patient not taking: Reported on 12/07/2020) 30 tablet 3   potassium chloride SA (KLOR-CON) 20 MEQ tablet Take potasium with each lasix tablet taken. (Patient not taking: Reported on 12/07/2020) 30 tablet 3   No facility-administered medications prior to visit.    Allergies  Allergen Reactions   Tetracycline Hives and Itching   Dilaudid [Hydromorphone Hcl] Nausea And Vomiting   Other     Cigarette smoke and perfumes--flares asthma (develops bronchitis)    ROS Review of  Systems  Constitutional:  Negative for diaphoresis, fatigue, fever and unexpected weight change.  HENT: Negative.    Eyes:  Negative for photophobia and visual disturbance.  Respiratory: Negative.    Cardiovascular: Negative.   Gastrointestinal: Negative.   Endocrine: Negative for polyphagia and polyuria.  Genitourinary: Negative.  Negative for difficulty urinating, dysuria and frequency.  Musculoskeletal:  Positive for arthralgias and gait problem.  Neurological:  Negative for weakness and light-headedness.  Psychiatric/Behavioral: Negative.       Objective:    Physical Exam Vitals and nursing note reviewed.  Constitutional:      General: She is not in acute distress.    Appearance: Normal appearance. She is not ill-appearing, toxic-appearing or diaphoretic.  HENT:     Head: Normocephalic and atraumatic.     Right Ear: External ear normal.     Left Ear: External ear normal.  Eyes:     General: No scleral icterus.       Right eye: No discharge.        Left eye: No discharge.     Extraocular Movements: Extraocular movements intact.     Conjunctiva/sclera: Conjunctivae normal.  Cardiovascular:     Rate and Rhythm: Normal rate and regular rhythm.  Pulmonary:     Effort: Pulmonary effort is normal.     Breath sounds: Normal breath sounds.  Musculoskeletal:     Left knee: Swelling and laceration present. No erythema. Decreased range of motion.     Right lower leg: No edema.     Left lower leg: No edema.       Legs:  Neurological:     Mental Status: She is alert and oriented to person, place, and time.  Psychiatric:  Mood and Affect: Mood normal.        Behavior: Behavior normal.    BP 132/84 (BP Location: Left Arm, Patient Position: Sitting, Cuff Size: Normal)   Pulse (!) 101   Temp (!) 97 F (36.1 C) (Temporal)   Resp 18   Wt 155 lb 12.8 oz (70.7 kg)   SpO2 98%   BMI 26.74 kg/m  Wt Readings from Last 3 Encounters:  12/07/20 155 lb 12.8 oz (70.7 kg)   11/17/20 153 lb (69.4 kg)  11/04/20 153 lb (69.4 kg)     Health Maintenance Due  Topic Date Due   COVID-19 Vaccine (5 - Booster for Moderna series) 10/16/2020    There are no preventive care reminders to display for this patient.  Lab Results  Component Value Date   TSH 6.15 (H) 09/30/2020   Lab Results  Component Value Date   WBC 7.9 11/17/2020   HGB 13.5 11/17/2020   HCT 43.5 11/17/2020   MCV 104.6 (H) 11/17/2020   PLT 317 11/17/2020   Lab Results  Component Value Date   NA 143 12/07/2020   K 2.9 (L) 12/07/2020   CO2 28 12/07/2020   GLUCOSE 130 (H) 12/07/2020   BUN 12 12/07/2020   CREATININE 0.98 12/07/2020   BILITOT 0.4 09/30/2020   ALKPHOS 57 09/30/2020   AST 16 09/30/2020   ALT 15 09/30/2020   PROT 7.0 09/30/2020   ALBUMIN 4.1 09/30/2020   CALCIUM 9.0 12/07/2020   ANIONGAP 4 (L) 11/20/2020   GFR 58.39 (L) 12/07/2020   Lab Results  Component Value Date   CHOL 249 (H) 09/30/2020   Lab Results  Component Value Date   HDL 66.60 09/30/2020   Lab Results  Component Value Date   LDLCALC 155 (H) 09/30/2020   Lab Results  Component Value Date   TRIG 135.0 09/30/2020   Lab Results  Component Value Date   CHOLHDL 4 09/30/2020   Lab Results  Component Value Date   HGBA1C 5.6 12/07/2020      Assessment & Plan:   Problem List Items Addressed This Visit       Endocrine   Hypothyroidism - Primary     Other   Hypokalemia   Relevant Medications   potassium chloride SA (KLOR-CON) 20 MEQ tablet   Other Relevant Orders   Basic metabolic panel (Completed)   Elevated glucose   Relevant Orders   Basic metabolic panel (Completed)   Urinalysis, Routine w reflex microscopic (Completed)   Hemoglobin A1c (Completed)   Macrocytosis   Relevant Orders   Vitamin B12 (Completed)    Meds ordered this encounter  Medications   potassium chloride SA (KLOR-CON) 20 MEQ tablet    Sig: Take 1 tablet (20 mEq total) by mouth daily.    Dispense:  30 tablet     Refill:  3     Follow-up: Return Has scheduled appointment on 12 3.Libby Maw, MD

## 2020-12-08 ENCOUNTER — Ambulatory Visit: Payer: PPO | Admitting: Physical Therapy

## 2020-12-08 ENCOUNTER — Encounter: Payer: Self-pay | Admitting: Physical Therapy

## 2020-12-08 DIAGNOSIS — M25662 Stiffness of left knee, not elsewhere classified: Secondary | ICD-10-CM

## 2020-12-08 DIAGNOSIS — R600 Localized edema: Secondary | ICD-10-CM

## 2020-12-08 DIAGNOSIS — M25562 Pain in left knee: Secondary | ICD-10-CM

## 2020-12-08 DIAGNOSIS — R262 Difficulty in walking, not elsewhere classified: Secondary | ICD-10-CM | POA: Diagnosis not present

## 2020-12-08 NOTE — Patient Instructions (Signed)
Access Code: [email or web address] URL: [email or web address] Date: 12/08/2020 Prepared by: Jamey Reas  Exercises Long Sitting Quad Set with Towel Roll Under Heel - 3 x daily - 7 x weekly - 2 sets - 10 reps - 5 seconds hold Supine Leg Press - 3 x daily - 7 x weekly - 2 sets - 10 reps - 5 seconds hold Ankle Alphabet in Elevation - 3 x daily - 7 x weekly - 2 sets - 1 reps Supine Bridge - 3 x daily - 7 x weekly - 2 sets - 10 reps Supine Active Straight Leg Raise - 3 x daily - 7 x weekly - 2 sets - 10 reps Supine Heel Slide with Strap - 3 x daily - 7 x weekly - 10 reps - 10 second hold Seated Knee Flexion AAROM - 3 x daily - 7 x weekly - 10 reps - 10 seconds hold Seated Hamstring Stretch with Strap - 2-3 x daily - 7 x weekly - 1 sets - 3 reps - 30 seconds hold Seated Knee Flexion Extension AROM - 2-3 x daily - 7 x weekly - 2 sets - 10 reps - 5 seconds hold Seated Long Arc Quad - 3 x daily - 7 x weekly - 2 sets - 10 reps - 3-5 seconds hold

## 2020-12-08 NOTE — Therapy (Signed)
[name], [place], [address] Phone: [phone]   Fax:  [phone]  Physical Therapy Treatment  Patient Details  Name: Megan Valentine MRN: [id number] Date of Birth: 1951/01/09 Referring Provider (PT): Joni Fears, MD   Encounter Date: 12/08/2020   PT End of Session - 12/08/20 1525     Visit Number 3    Number of Visits 16    Date for PT Re-Evaluation 01/29/21    Authorization Type [employer]    Progress Note Due on Visit 10    PT Start Time 1497    PT Stop Time 1610    PT Time Calculation (min) 55 min    Activity Tolerance Patient limited by pain;Patient tolerated treatment well    Behavior During Therapy WFL for tasks assessed/performed             Past Medical History:  Diagnosis Date   Allergy    Arthritis    Asthma    Cataract    Chronic bronchitis ([place])    Chronic kidney disease    stage 3, kidney infections   Complication of anesthesia    hard to wake up   Diverticulosis    Environmental allergies    GERD (gastroesophageal reflux disease)    Hiatal hernia    History of chicken pox    Hyperlipidemia    Hypothyroidism    IBS (irritable bowel syndrome)    Migraines    Mitral valve prolapse    Per pt . per Dr.Henry  [name]  does not have MVP   Neuromuscular disorder ([place])    neuropathy feet   Pneumonia    Rectal prolapse     Past Surgical History:  Procedure Laterality Date   ABDOMINAL HYSTERECTOMY  1999   APPENDECTOMY  1962   BREAST CYST ASPIRATION Bilateral    CHOLECYSTECTOMY N/A 12/17/2018   Procedure: LAPAROSCOPIC CHOLECYSTECTOMY;  Surgeon: Clovis Riley, MD;  Location: [facility];  Service: General;  Laterality: N/A;   EYE SURGERY     cataract   JOINT REPLACEMENT     rt knee scope   REFRACTIVE SURGERY  2000   both eyes   retrocele repair     and bladder sling   TOTAL KNEE ARTHROPLASTY Right 03/24/2015   Procedure: TOTAL KNEE ARTHROPLASTY;  Surgeon: [name], MD;  Location: [facility];  Service: Orthopedics;  Laterality: Right;   TOTAL KNEE ARTHROPLASTY Left 11/17/2020   Procedure: LEFT TOTAL KNEE ARTHROPLASTY;  Surgeon: [name], MD;  Location: [facility];  Service: Orthopedics;  Laterality: Left;   TUBAL LIGATION  1977    There were no vitals filed for this visit.   Subjective Assessment - 12/08/20 1515     Subjective She has not been able to tolerate exercises much.  She is using CPM.  She was able to get in & out of shower.    Pertinent History arthritis, asthma, cataract, CKD, GERD, mitral valve prolapse, neuromusclular disorder feet, IBS, diverticulosis, hysterectomy 1999, joint replacement right knee 2017    Diagnostic tests X-ray    Patient Stated Goals Walk without device,    Currently in Pain? Yes    Pain Score 4    since last PT session, lowest 3/10 & highest 9+/10   Pain Location Knee    Pain Orientation Left    Pain Descriptors / Indicators Burning;Stabbing    Pain Type Surgical pain    Pain Onset 1 to 4 weeks ago  Pain Frequency Constant    Aggravating Factors  sit in car then getting up.  bending, walking,    Pain Relieving Factors pain meds, ice,    Effect of Pain on Daily Activities sleeping, stepping into home,                               [facility] Adult PT Treatment/Exercise - 12/08/20 1515       Ambulation/Gait   Ambulation/Gait Yes    Ambulation/Gait Assistance Details knee flexion initiated in terminal stance into swing to clear foot    Assistive device Straight cane      Exercises   Exercises Knee/Hip      Knee/Hip Exercises: Stretches   Active Hamstring Stretch Left;3 reps;30 seconds    Active Hamstring Stretch Limitations seated LE ext with strap DF and trunk flexion    Knee: Self-Stretch Limitations --      Knee/Hip Exercises: Aerobic   Nustep L5 x 8 min seat 7      Knee/Hip Exercises: Standing   Knee Flexion AROM;Left;20 reps    Knee Flexion Limitations stepping  over 2" step 8" long near counter to prevent circumduction as pre-gait for knee flexion in swing      Knee/Hip Exercises: Seated   Long Arc Quad Left;2 sets;10 reps    Long Arc Quad Limitations 3 sec hold    Heel Slides AROM;Left;2 sets;10 reps    Heel Slides Limitations sliding towel on floor - 5 sec leg press / knee ext and 5 sec hold knee flexion    Sit to Sand 2 sets;10 reps;with UE support   PT demo & verbal cues on proper technique;  1st set chair with armrests & 2nd set chair without armrests     Knee/Hip Exercises: Supine   Quad Sets Left;2 sets;10 reps    Quad Sets Limitations leg press with quad set while LE propped for ext stretch    Heel Slides AAROM;Left;10 reps;2 sets    Heel Slides Limitations foot on underinflated ball with strap assist    Bridges 10 reps    Bridges Limitations cues for LEs with equal flexion to start    Straight Leg Raises Left;10 reps    Straight Leg Raises Limitations quad lag present      Modalities   Modalities Vasopneumatic      Vasopneumatic   Number Minutes Vasopneumatic  10 minutes    Vasopnuematic Location  Knee    Vasopneumatic Pressure Medium    Vasopneumatic Temperature  34      Manual Therapy   Manual therapy comments seated Lt knee flexion with Rt LAQ - gentle PROM                     PT Education - 12/08/20 1628     Education Details reviewed & updated HEP  Access Code: [email or web address]    Person(s) Educated Patient    Methods Explanation;Demonstration;Tactile cues;Verbal cues;Handout    Comprehension Verbalized understanding;Returned demonstration;Verbal cues required;Tactile cues required;Need further instruction              PT Short Term Goals - 12/01/20 1432       PT SHORT TERM GOAL #1   Title Pt will be independent in her initial  HEP.    Time 3    Period Weeks    Status New    Target Date 12/25/20      PT SHORT TERM  GOAL #2   Title Pt will improve her 5 time sit to stand to </= 14 seconds with no UE  support.    Time 4    Period Weeks    Status New    Target Date 01/01/21               PT Long Term Goals - 12/01/20 1454       PT LONG TERM GOAL #1   Title Pt will be indepedent in advanced HEP.    Time 8    Period Weeks    Status New    Target Date 01/29/21      PT LONG TERM GOAL #2   Title Pt will improve her left knee AROM arc from 2-115 degrees.    Time 8    Period Weeks    Status New    Target Date 01/29/21      PT LONG TERM GOAL #3   Title Pt will be able to navigate 1 flight of stairs step over step with single hand rail.    Time 8    Period Weeks    Status New      PT LONG TERM GOAL #4   Title Pt will be able to report pain </= 2/10 with ADL's and home chores.    Time 8    Period Weeks    Status New    Target Date 01/29/21      PT LONG TERM GOAL #5   Title Pt will improve her FOTO to >/= 60%.    Time 8    Period Weeks    Status New    Target Date 01/29/21                   Plan - 12/08/20 1526     Clinical Impression Statement PT reviewed & updated HEP which she appears to understand along with compliance.  PT recommended breaking up exercises during day to decrease fatigue & stiffness.  She reports better understanding including new exercises.    Personal Factors and Comorbidities Comorbidity 3+    Comorbidities arthritis, asthma, cataract, CKD, GERD, mitral valve prolapse, neuromusclular disorder feet, IBS, diverticulosis, hysterectomy 1999, joint replacement right knee 2017    Examination-Activity Limitations Lift;Transfers;Other;Sit;Squat;Stand;Stairs;Dressing    Examination-Participation Restrictions Community Activity;Other;Yard Work;Driving    Stability/Clinical Decision Making Stable/Uncomplicated    Rehab Potential Good    PT Frequency 2x / week    PT Duration 8 weeks    PT Treatment/Interventions ADLs/Self Care Home Management;Cryotherapy;Electrical Stimulation;Ultrasound;Gait training;Stair training;Functional mobility  training;Therapeutic activities;Therapeutic exercise;Balance training;Neuromuscular re-education;Patient/family education;Passive range of motion;Dry needling;Taping;Manual techniques;Vasopneumatic Device    PT Next Visit Plan Begin on Nustep, LE strenghtening including standing functional, manual therapy & exercises to increase knee ROM    PT Home Exercise Plan Access Code: [email or web address]    Consulted and Agree with Plan of Care Patient             Patient will benefit from skilled therapeutic intervention in order to improve the following deficits and impairments:  Decreased mobility, Decreased strength, Postural dysfunction, Decreased balance, Impaired flexibility, Pain, Decreased activity tolerance, Difficulty walking, Abnormal gait  Visit Diagnosis: Acute pain of left knee  Difficulty in walking, not elsewhere classified  Localized edema  Stiffness of left knee, not elsewhere classified     Problem List Patient Active Problem List   Diagnosis Date Noted   Macrocytosis 12/07/2020   S/P TKR (total knee replacement) using cement, left 11/17/2020  Chronic pain of right knee 09/30/2020   Medication side effect 09/30/2020   Statin intolerance 06/30/2020   Trigger point of shoulder region, left 02/25/2020   Elevated glucose 08/15/2019   Anemia 02/15/2019   Abdominal pain 12/14/2018   Sinus pressure 11/12/2018   History of 2019 novel coronavirus disease (COVID-19) 11/12/2018   Pleurisy 06/05/2018   Enterocele 06/05/2018   Rectocele 05/23/2018   Vaginal vault prolapse 05/23/2018   SUI (stress urinary incontinence, female) 05/23/2018   Asthma 05/07/2018   Stage 3a chronic kidney disease ([place]) 01/15/2018   Edema 12/15/2017   Insomnia 11/20/2017   Seborrheic dermatitis 11/16/2017   Seasonal allergic rhinitis due to pollen 11/16/2017   Menopausal and female climacteric states 10/19/2017   Constipation 03/26/2015   Unilateral primary osteoarthritis, left knee 11/17/2014    Healthcare maintenance 04/25/2014   Hypokalemia 09/15/2011   Hypothyroidism 08/21/2008   Mixed hyperlipidemia 08/21/2008    Jamey Reas, PT, DPT 12/08/2020, 4:32 PM  [facility] Physical Therapy [address] [place], [place], [address] Phone: [phone]   Fax:  [phone]  Name: FRANSISCA SHAWN MRN: [id number] Date of Birth: Jun 16, 1950

## 2020-12-09 ENCOUNTER — Encounter: Payer: Self-pay | Admitting: Family Medicine

## 2020-12-10 ENCOUNTER — Other Ambulatory Visit: Payer: Self-pay

## 2020-12-10 ENCOUNTER — Other Ambulatory Visit: Payer: Self-pay | Admitting: Orthopaedic Surgery

## 2020-12-10 ENCOUNTER — Encounter: Payer: Self-pay | Admitting: Physical Therapy

## 2020-12-10 ENCOUNTER — Ambulatory Visit: Payer: PPO | Admitting: Physical Therapy

## 2020-12-10 ENCOUNTER — Encounter: Payer: Self-pay | Admitting: Orthopaedic Surgery

## 2020-12-10 DIAGNOSIS — R262 Difficulty in walking, not elsewhere classified: Secondary | ICD-10-CM | POA: Diagnosis not present

## 2020-12-10 DIAGNOSIS — M25562 Pain in left knee: Secondary | ICD-10-CM

## 2020-12-10 DIAGNOSIS — R600 Localized edema: Secondary | ICD-10-CM

## 2020-12-10 DIAGNOSIS — M25662 Stiffness of left knee, not elsewhere classified: Secondary | ICD-10-CM

## 2020-12-10 MED ORDER — OXYCODONE-ACETAMINOPHEN 5-325 MG PO TABS: 1.0000 | ORAL_TABLET | Freq: Three times a day (TID) | ORAL | 0 refills | Status: DC | PRN

## 2020-12-10 MED ORDER — METHOCARBAMOL 500 MG PO TABS: 500.0000 mg | ORAL_TABLET | Freq: Two times a day (BID) | ORAL | 1 refills | Status: DC | PRN

## 2020-12-10 NOTE — Therapy (Signed)
[place], [place], [address] Phone: [phone]   Fax:  [phone]  Physical Therapy Treatment  Patient Details  Name: Megan Valentine MRN: [id number] Date of Birth: February 01, 1950 Referring Provider (PT): Joni Fears, MD   Encounter Date: 12/10/2020   PT End of Session - 12/10/20 1541     Visit Number 4    Number of Visits 16    Date for PT Re-Evaluation 01/29/21    Authorization Type [employer]    Progress Note Due on Visit 10    PT Start Time 1510    PT Stop Time 1550    PT Time Calculation (min) 40 min    Activity Tolerance Patient limited by pain;Patient tolerated treatment well    Behavior During Therapy WFL for tasks assessed/performed             Past Medical History:  Diagnosis Date   Allergy    Arthritis    Asthma    Cataract    Chronic bronchitis ([place])    Chronic kidney disease    stage 3, kidney infections   Complication of anesthesia    hard to wake up   Diverticulosis    Environmental allergies    GERD (gastroesophageal reflux disease)    Hiatal hernia    History of chicken pox    Hyperlipidemia    Hypothyroidism    IBS (irritable bowel syndrome)    Migraines    Mitral valve prolapse    Per pt . per Dr.Henry  [name].  does not have MVP   Neuromuscular disorder ([place])    neuropathy feet   Pneumonia    Rectal prolapse     Past Surgical History:  Procedure Laterality Date   ABDOMINAL HYSTERECTOMY  1999   APPENDECTOMY  1962   BREAST CYST ASPIRATION Bilateral    CHOLECYSTECTOMY N/A 12/17/2018   Procedure: LAPAROSCOPIC CHOLECYSTECTOMY;  Surgeon: Clovis Riley, MD;  Location: [facility];  Service: General;  Laterality: N/A;   EYE SURGERY     cataract   JOINT REPLACEMENT     rt knee scope   REFRACTIVE SURGERY  2000   both eyes   retrocele repair     and bladder sling   TOTAL KNEE ARTHROPLASTY Right 03/24/2015   Procedure: TOTAL KNEE ARTHROPLASTY;  Surgeon: [name], MD;  Location: [facility];  Service: Orthopedics;  Laterality: Right;   TOTAL KNEE ARTHROPLASTY Left 11/17/2020   Procedure: LEFT TOTAL KNEE ARTHROPLASTY;  Surgeon: [name], MD;  Location: [facility];  Service: Orthopedics;  Laterality: Left;   TUBAL LIGATION  1977    There were no vitals filed for this visit.   Subjective Assessment - 12/10/20 1514     Subjective didn't sleep well last night, had a great day yesterday and thinks she may have overdone it.  she does feel better today though.    Pertinent History arthritis, asthma, cataract, CKD, GERD, mitral valve prolapse, neuromusclular disorder feet, IBS, diverticulosis, hysterectomy 1999, joint replacement right knee 2017    Diagnostic tests X-ray    Patient Stated Goals Walk without device,    Currently in Pain? Yes    Pain Score 2     Pain Location Knee    Pain Orientation Left    Pain Descriptors / Indicators Aching    Pain Type Surgical pain    Pain Onset 1 to 4 weeks ago    Pain Frequency Constant    Aggravating Factors  changing positions, bending, walking    Pain Relieving Factors meds, ice                [facility] PT Assessment - 12/10/20 1532       Assessment   Medical Diagnosis M17.12    Referring Provider (PT) Joni Fears, MD      PROM   Left Knee Extension -3    Left Knee Flexion 101   AA                          [facility] Adult PT Treatment/Exercise - 12/10/20 1516       Knee/Hip Exercises: Stretches   Knee: Self-Stretch Limitations LLE 10 x 10 sec; overpressure from RLE      Knee/Hip Exercises: Aerobic   Nustep L6 x 8 min seat 6      Knee/Hip Exercises: Seated   Long Arc Quad Left;2 sets;10 reps    Long Arc Quad Weight 3 lbs.   1st set with AA from PT to get full extension   Long Arc Quad Limitations 3 sec hold      Knee/Hip Exercises: Supine   Heel Slides AAROM;Left;5 reps    Heel Slides Limitations prior to measurement    Straight Leg Raises Left;10 reps     Straight Leg Raises Limitations quad lag present      Vasopneumatic   Number Minutes Vasopneumatic  10 minutes    Vasopnuematic Location  Knee    Vasopneumatic Pressure Medium    Vasopneumatic Temperature  34                       PT Short Term Goals - 12/01/20 1432       PT SHORT TERM GOAL #1   Title Pt will be independent in her initial  HEP.    Time 3    Period Weeks    Status New    Target Date 12/25/20      PT SHORT TERM GOAL #2   Title Pt will improve her 5 time sit to stand to </= 14 seconds with no UE support.    Time 4    Period Weeks    Status New    Target Date 01/01/21               PT Long Term Goals - 12/01/20 1454       PT LONG TERM GOAL #1   Title Pt will be indepedent in advanced HEP.    Time 8    Period Weeks    Status New    Target Date 01/29/21      PT LONG TERM GOAL #2   Title Pt will improve her left knee AROM arc from 2-115 degrees.    Time 8    Period Weeks    Status New    Target Date 01/29/21      PT LONG TERM GOAL #3   Title Pt will be able to navigate 1 flight of stairs step over step with single hand rail.    Time 8    Period Weeks    Status New      PT LONG TERM GOAL #4   Title Pt will be able to report pain </= 2/10 with ADL's and home chores.    Time 8    Period Weeks    Status New    Target Date 01/29/21      PT  LONG TERM GOAL #5   Title Pt will improve her FOTO to >/= 60%.    Time 8    Period Weeks    Status New    Target Date 01/29/21                   Plan - 12/10/20 1541     Clinical Impression Statement Steady improvements noted in PROM today.  Still has difficulty with isolated quad activation and will continue to benefit from this to improve balance and strength.  Continued PT recommended to maximize function.    Personal Factors and Comorbidities Comorbidity 3+    Comorbidities arthritis, asthma, cataract, CKD, GERD, mitral valve prolapse, neuromusclular disorder feet, IBS,  diverticulosis, hysterectomy 1999, joint replacement right knee 2017    Examination-Activity Limitations Lift;Transfers;Other;Sit;Squat;Stand;Stairs;Dressing    Examination-Participation Restrictions Community Activity;Other;Yard Work;Driving    Stability/Clinical Decision Making Stable/Uncomplicated    Rehab Potential Good    PT Frequency 2x / week    PT Duration 8 weeks    PT Treatment/Interventions ADLs/Self Care Home Management;Cryotherapy;Electrical Stimulation;Ultrasound;Gait training;Stair training;Functional mobility training;Therapeutic activities;Therapeutic exercise;Balance training;Neuromuscular re-education;Patient/family education;Passive range of motion;Dry needling;Taping;Manual techniques;Vasopneumatic Device    PT Next Visit Plan Begin on Nustep, LE strenghtening including standing functional, manual therapy & exercises to increase knee ROM    PT Home Exercise Plan Access Code: [email or web address]    Consulted and Agree with Plan of Care Patient             Patient will benefit from skilled therapeutic intervention in order to improve the following deficits and impairments:  Decreased mobility, Decreased strength, Postural dysfunction, Decreased balance, Impaired flexibility, Pain, Decreased activity tolerance, Difficulty walking, Abnormal gait  Visit Diagnosis: Acute pain of left knee  Difficulty in walking, not elsewhere classified  Localized edema  Stiffness of left knee, not elsewhere classified     Problem List Patient Active Problem List   Diagnosis Date Noted   Macrocytosis 12/07/2020   S/P TKR (total knee replacement) using cement, left 11/17/2020   Chronic pain of right knee 09/30/2020   Medication side effect 09/30/2020   Statin intolerance 06/30/2020   Trigger point of shoulder region, left 02/25/2020   Elevated glucose 08/15/2019   Anemia 02/15/2019   Abdominal pain 12/14/2018   Sinus pressure 11/12/2018   History of 2019 novel coronavirus disease  (COVID-19) 11/12/2018   Pleurisy 06/05/2018   Enterocele 06/05/2018   Rectocele 05/23/2018   Vaginal vault prolapse 05/23/2018   SUI (stress urinary incontinence, female) 05/23/2018   Asthma 05/07/2018   Stage 3a chronic kidney disease ([place]) 01/15/2018   Edema 12/15/2017   Insomnia 11/20/2017   Seborrheic dermatitis 11/16/2017   Seasonal allergic rhinitis due to pollen 11/16/2017   Menopausal and female climacteric states 10/19/2017   Constipation 03/26/2015   Unilateral primary osteoarthritis, left knee 11/17/2014   Healthcare maintenance 04/25/2014   Hypokalemia 09/15/2011   Hypothyroidism 08/21/2008   Mixed hyperlipidemia 08/21/2008       Laureen Abrahams, PT, DPT 12/10/20 3:43 PM      [facility] [address] [place], [place], [address] Phone: [phone]   Fax:  [phone]  Name: Megan Valentine MRN: [id number] Date of Birth: 21-May-1950

## 2020-12-11 ENCOUNTER — Other Ambulatory Visit: Payer: Self-pay | Admitting: Family Medicine

## 2020-12-11 DIAGNOSIS — E039 Hypothyroidism, unspecified: Secondary | ICD-10-CM

## 2020-12-14 ENCOUNTER — Encounter: Payer: PPO | Admitting: Physical Therapy

## 2020-12-15 ENCOUNTER — Encounter: Payer: Self-pay | Admitting: Physical Therapy

## 2020-12-15 ENCOUNTER — Other Ambulatory Visit: Payer: Self-pay

## 2020-12-15 ENCOUNTER — Ambulatory Visit (INDEPENDENT_AMBULATORY_CARE_PROVIDER_SITE_OTHER): Payer: PPO | Admitting: Orthopaedic Surgery

## 2020-12-15 ENCOUNTER — Ambulatory Visit: Payer: PPO | Admitting: Physical Therapy

## 2020-12-15 ENCOUNTER — Encounter: Payer: Self-pay | Admitting: Orthopaedic Surgery

## 2020-12-15 ENCOUNTER — Telehealth: Payer: Self-pay | Admitting: *Deleted

## 2020-12-15 DIAGNOSIS — R600 Localized edema: Secondary | ICD-10-CM

## 2020-12-15 DIAGNOSIS — M1712 Unilateral primary osteoarthritis, left knee: Secondary | ICD-10-CM

## 2020-12-15 DIAGNOSIS — M25562 Pain in left knee: Secondary | ICD-10-CM

## 2020-12-15 DIAGNOSIS — R262 Difficulty in walking, not elsewhere classified: Secondary | ICD-10-CM | POA: Diagnosis not present

## 2020-12-15 DIAGNOSIS — M25662 Stiffness of left knee, not elsewhere classified: Secondary | ICD-10-CM | POA: Diagnosis not present

## 2020-12-15 NOTE — Therapy (Signed)
[place], [place], [address] Phone: [phone]   Fax:  [phone]  Physical Therapy Treatment  Patient Details  Name: Megan Valentine MRN: [id number] Date of Birth: [birth year] Referring Provider (PT): Joni Fears, MD   Encounter Date: 12/15/2020   PT End of Session - 12/15/20 1439     Visit Number 5    Number of Visits 16    Date for PT Re-Evaluation 01/29/21    Authorization Type [employer]    Progress Note Due on Visit 10    PT Start Time 1430    PT Stop Time 1523    PT Time Calculation (min) 53 min    Activity Tolerance Patient limited by pain;Patient tolerated treatment well    Behavior During Therapy WFL for tasks assessed/performed             Past Medical History:  Diagnosis Date   Allergy    Arthritis    Asthma    Cataract    Chronic bronchitis ([place])    Chronic kidney disease    stage 3, kidney infections   Complication of anesthesia    hard to wake up   Diverticulosis    Environmental allergies    GERD (gastroesophageal reflux disease)    Hiatal hernia    History of chicken pox    Hyperlipidemia    Hypothyroidism    IBS (irritable bowel syndrome)    Migraines    Mitral valve prolapse    Per pt . per Dr.Henry  [name]  does not have MVP   Neuromuscular disorder ([place])    neuropathy feet   Pneumonia    Rectal prolapse     Past Surgical History:  Procedure Laterality Date   ABDOMINAL HYSTERECTOMY  1999   APPENDECTOMY  1962   BREAST CYST ASPIRATION Bilateral    CHOLECYSTECTOMY N/A 12/17/2018   Procedure: LAPAROSCOPIC CHOLECYSTECTOMY;  Surgeon: Clovis Riley, MD;  Location: [facility];  Service: General;  Laterality: N/A;   EYE SURGERY     cataract   JOINT REPLACEMENT     rt knee scope   REFRACTIVE SURGERY  2000   both eyes   retrocele repair     and bladder sling   TOTAL KNEE ARTHROPLASTY Right 03/24/2015   Procedure: TOTAL KNEE ARTHROPLASTY;  Surgeon: [name], MD;  Location: [facility];  Service: Orthopedics;  Laterality: Right;   TOTAL KNEE ARTHROPLASTY Left 11/17/2020   Procedure: LEFT TOTAL KNEE ARTHROPLASTY;  Surgeon: [name], MD;  Location[facility];  Service: Orthopedics;  Laterality: Left;   TUBAL LIGATION  1977    There were no vitals filed for this visit.   Subjective Assessment - 12/15/20 1430     Subjective She saw Dr. Durward Fortes today who thinks she's progressing. He recommended putting 2 soup cans in purse over knee.  She is standing more.    Pertinent History arthritis, asthma, cataract, CKD, GERD, mitral valve prolapse, neuromusclular disorder feet, IBS, diverticulosis, hysterectomy 1999, joint replacement right knee 2017    Diagnostic tests X-ray    Patient Stated Goals Walk without device,    Currently in Pain? Yes    Pain Score 0-No pain   since last PT, ranging lowest 0/10 highest 8-9/10   Pain Location Knee    Pain Orientation Left    Pain Descriptors / Indicators Aching    Pain Type Surgical pain    Pain Onset 1 to 4 weeks ago  Pain Frequency Intermittent    Aggravating Factors  riding in car, sitting with bent    Pain Relieving Factors meds, ice    Effect of Pain on Daily Activities sleeping, sitting with knee bent                               [facility] Adult PT Treatment/Exercise - 12/15/20 1430       Knee/Hip Exercises: Stretches   Knee: Self-Stretch Limitations --    Gastroc Stretch Left;2 reps;30 seconds    Gastroc Stretch Limitations step with heel depression      Knee/Hip Exercises: Aerobic   Nustep L6 x 8 min seat 7      Knee/Hip Exercises: Standing   Heel Raises Both;1 set;15 reps    Heel Raises Limitations alternating with toe raises.    Rocker Board 1 minute   ant/post & right/left   Theatre stage manager, demo & verbal cues for technique / knee control    Other Standing Knee Exercises LLE back - wt shift bw feet with RLE DF 15 reps    Other Standing  Knee Exercises tandem stance on foam beam intermittent touch 60sec LLE in front & 60sec LLE in back      Knee/Hip Exercises: Seated   Long Arc Quad Left;2 sets;10 reps    Long Arc Quad Weight 3 lbs.   1st set with AA from PT to get full extension   Long Arc Quad Limitations 3 sec hold, using cane to assist 10-20* past AROM    Other Seated Knee/Hip Exercises PT demo propping with weight on knee for stretch that Dr Durward Fortes was talking about.  Pt reported too painful.      Knee/Hip Exercises: Supine   Heel Slides --    Heel Slides Limitations --    Straight Leg Raises --    Straight Leg Raises Limitations --      Knee/Hip Exercises: Prone   Hamstring Curl 2 sets;10 reps;5 seconds    Hamstring Curl Limitations knee flexion & terminal knee ext (pulling toes towards floor)    Prone Knee Hang 5 minutes      Vasopneumatic   Number Minutes Vasopneumatic  10 minutes    Vasopnuematic Location  Knee    Vasopneumatic Pressure Medium    Vasopneumatic Temperature  34                       PT Short Term Goals - 12/01/20 1432       PT SHORT TERM GOAL #1   Title Pt will be independent in her initial  HEP.    Time 3    Period Weeks    Status New    Target Date 12/25/20      PT SHORT TERM GOAL #2   Title Pt will improve her 5 time sit to stand to </= 14 seconds with no UE support.    Time 4    Period Weeks    Status New    Target Date 01/01/21               PT Long Term Goals - 12/01/20 1454       PT LONG TERM GOAL #1   Title Pt will be indepedent in advanced HEP.    Time 8    Period Weeks    Status New    Target Date 01/29/21  PT LONG TERM GOAL #2   Title Pt will improve her left knee AROM arc from 2-115 degrees.    Time 8    Period Weeks    Status New    Target Date 01/29/21      PT LONG TERM GOAL #3   Title Pt will be able to navigate 1 flight of stairs step over step with single hand rail.    Time 8    Period Weeks    Status New      PT  LONG TERM GOAL #4   Title Pt will be able to report pain </= 2/10 with ADL's and home chores.    Time 8    Period Weeks    Status New    Target Date 01/29/21      PT LONG TERM GOAL #5   Title Pt will improve her FOTO to >/= 60%.    Time 8    Period Weeks    Status New    Target Date 01/29/21                   Plan - 12/15/20 1439     Clinical Impression Statement PT progressed exercises to facilitate knee ext.  She tolerated new exercises for short period then her knee would become too uncomfortable to continue that activity.    Personal Factors and Comorbidities Comorbidity 3+    Comorbidities arthritis, asthma, cataract, CKD, GERD, mitral valve prolapse, neuromusclular disorder feet, IBS, diverticulosis, hysterectomy 1999, joint replacement right knee 2017    Examination-Activity Limitations Lift;Transfers;Other;Sit;Squat;Stand;Stairs;Dressing    Examination-Participation Restrictions Community Activity;Other;Yard Work;Driving    Stability/Clinical Decision Making Stable/Uncomplicated    Rehab Potential Good    PT Frequency 2x / week    PT Duration 8 weeks    PT Treatment/Interventions ADLs/Self Care Home Management;Cryotherapy;Electrical Stimulation;Ultrasound;Gait training;Stair training;Functional mobility training;Therapeutic activities;Therapeutic exercise;Balance training;Neuromuscular re-education;Patient/family education;Passive range of motion;Dry needling;Taping;Manual techniques;Vasopneumatic Device    PT Next Visit Plan update HEP,  check STGs,  Begin on Nustep, LE strenghtening including standing functional, manual therapy & exercises to increase knee ROM    PT Home Exercise Plan Access Code: [email or web address]    Consulted and Agree with Plan of Care Patient             Patient will benefit from skilled therapeutic intervention in order to improve the following deficits and impairments:  Decreased mobility, Decreased strength, Postural dysfunction, Decreased  balance, Impaired flexibility, Pain, Decreased activity tolerance, Difficulty walking, Abnormal gait  Visit Diagnosis: Acute pain of left knee  Difficulty in walking, not elsewhere classified  Localized edema  Stiffness of left knee, not elsewhere classified     Problem List Patient Active Problem List   Diagnosis Date Noted   Macrocytosis 12/07/2020   S/P TKR (total knee replacement) using cement, left 11/17/2020   Chronic pain of right knee 09/30/2020   Medication side effect 09/30/2020   Statin intolerance 06/30/2020   Trigger point of shoulder region, left 02/25/2020   Elevated glucose 08/15/2019   Anemia 02/15/2019   Abdominal pain 12/14/2018   Sinus pressure 11/12/2018   History of 2019 novel coronavirus disease (COVID-19) 11/12/2018   Pleurisy 06/05/2018   Enterocele 06/05/2018   Rectocele 05/23/2018   Vaginal vault prolapse 05/23/2018   SUI (stress urinary incontinence, female) 05/23/2018   Asthma 05/07/2018   Stage 3a chronic kidney disease ([place]) 01/15/2018   Edema 12/15/2017   Insomnia 11/20/2017   Seborrheic dermatitis 11/16/2017   Seasonal allergic rhinitis due  to pollen 11/16/2017   Menopausal and female climacteric states 10/19/2017   Constipation 03/26/2015   Unilateral primary osteoarthritis, left knee 11/17/2014   Healthcare maintenance 04/25/2014   Hypokalemia 09/15/2011   Hypothyroidism 08/21/2008   Mixed hyperlipidemia 08/21/2008    Jamey Reas, PT, DPT 12/15/2020, 4:14 PM  [facility] [address] [place], [place], [address] Phone: [phone]   Fax:  [phone]  Name: HEILEY SHAIKH MRN: [id number] Date of Birth: [birth year]

## 2020-12-15 NOTE — Progress Notes (Signed)
Office Visit Note   Patient: Megan Valentine           Date of Birth: 1950/10/17           MRN: [id number] Visit Date: 12/15/2020              Requested by: Libby Maw, MD [address] [place],  [place] [address] PCP: Libby Maw, MD   Assessment & Plan: Visit Diagnoses:  1. Unilateral primary osteoarthritis, left knee     Plan: 1 month status post primary left total knee replacement doing well.  She still lacks about 5 degrees of full extension but flexed probably 100 degrees no instability.  There is still some induration around the knee but minimal discomfort..  Using a cane for ambulation.  Continue to work on strengthening exercises and massage the scar and I will plan to see her back in a month.  Follow-Up Instructions: Return in about 1 month (around 01/14/2021).   Orders:  No orders of the defined types were placed in this encounter.  No orders of the defined types were placed in this encounter.     Procedures: No procedures performed   Clinical Data: No additional findings.   Subjective: Chief Complaint  Patient presents with   Left Knee - Follow-up    Left total knee arthroplasty 11/17/2020  Patient presents today for follow up on her left knee. She had a left total knee arthroplasty on 11/17/2020. She is now 4 weeks out from surgery. She states that her pain in her knee is improving. She walks with a cane. She is taking her prescribed pain medicine. She said that she has been having pain in her left hip.  HPI  Review of Systems   Objective: Vital Signs: There were no vitals taken for this visit.  Physical Exam  Ortho Exam left knee was minimally warm but consistent with her surgery a month ago.  No instability.  Lacked about 5 degrees to full extension of flexed over 95 to 100 degrees.  No obvious effusion.  No calf pain.  Some minimal decrease sensibility along the medial and lateral aspect of her incision.  Has a history  of neuropathy.  Continue working on strengthening exercises and range of motion and see her back in a month.  Still taking occasional oxycodone  Specialty Comments:  No specialty comments available.  Imaging: No results found.   PMFS History: Patient Active Problem List   Diagnosis Date Noted   Macrocytosis 12/07/2020   S/P TKR (total knee replacement) using cement, left 11/17/2020   Chronic pain of right knee 09/30/2020   Medication side effect 09/30/2020   Statin intolerance 06/30/2020   Trigger point of shoulder region, left 02/25/2020   Elevated glucose 08/15/2019   Anemia 02/15/2019   Abdominal pain 12/14/2018   Sinus pressure 11/12/2018   History of 2019 novel coronavirus disease (COVID-19) 11/12/2018   Pleurisy 06/05/2018   Enterocele 06/05/2018   Rectocele 05/23/2018   Vaginal vault prolapse 05/23/2018   SUI (stress urinary incontinence, female) 05/23/2018   Asthma 05/07/2018   Stage 3a chronic kidney disease ([place]) 01/15/2018   Edema 12/15/2017   Insomnia 11/20/2017   Seborrheic dermatitis 11/16/2017   Seasonal allergic rhinitis due to pollen 11/16/2017   Menopausal and female climacteric states 10/19/2017   Constipation 03/26/2015   Unilateral primary osteoarthritis, left knee 11/17/2014   Healthcare maintenance 04/25/2014   Hypokalemia 09/15/2011   Hypothyroidism 08/21/2008   Mixed hyperlipidemia 08/21/2008  Past Medical History:  Diagnosis Date   Allergy    Arthritis    Asthma    Cataract    Chronic bronchitis ([facility])    Chronic kidney disease    stage 3, kidney infections   Complication of anesthesia    hard to wake up   Diverticulosis    Environmental allergies    GERD (gastroesophageal reflux disease)    Hiatal hernia    History of chicken pox    Hyperlipidemia    Hypothyroidism    IBS (irritable bowel syndrome)    Migraines    Mitral valve prolapse    Per pt . per Dr.Henry  [name]  does not have MVP   Neuromuscular disorder ([place])     neuropathy feet   Pneumonia    Rectal prolapse     Family History  Problem Relation Age of Onset   Stomach cancer Maternal Grandmother    Hypertension Maternal Grandmother    Diabetes Maternal Grandmother    Stroke Maternal Grandmother    Cancer Maternal Grandmother        Stomach cancer   Colon cancer Paternal Grandfather    Heart attack Paternal Grandfather    Hyperlipidemia Mother    Diabetes Mother    Hypertension Mother    Alzheimer's disease Mother    Diabetes Father    Hyperlipidemia Father    Heart disease Father    Heart attack Father    Stroke Paternal Grandmother     Past Surgical History:  Procedure Laterality Date   ABDOMINAL HYSTERECTOMY  1999   APPENDECTOMY  1962   BREAST CYST ASPIRATION Bilateral    CHOLECYSTECTOMY N/A 12/17/2018   Procedure: LAPAROSCOPIC CHOLECYSTECTOMY;  Surgeon: Clovis Riley, MD;  [facility];  Service: General;  Laterality: N/A;   EYE SURGERY     cataract   JOINT REPLACEMENT     rt knee scope   REFRACTIVE SURGERY  2000   both eyes   retrocele repair     and bladder sling   TOTAL KNEE ARTHROPLASTY Right 03/24/2015   Procedure: TOTAL KNEE ARTHROPLASTY;  Surgeon: [name], MD;  Location: [facility];  Service: Orthopedics;  Laterality: Right;   TOTAL KNEE ARTHROPLASTY Left 11/17/2020   Procedure: LEFT TOTAL KNEE ARTHROPLASTY;  Surgeon: [name], MD;  [facility];  Service: Orthopedics;  Laterality: Left;   TUBAL LIGATION  1977   Social History   Occupational History   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

## 2020-12-15 NOTE — Telephone Encounter (Signed)
Ortho bundle 30 day visit and survey completed.

## 2020-12-21 ENCOUNTER — Other Ambulatory Visit: Payer: Self-pay

## 2020-12-21 ENCOUNTER — Ambulatory Visit: Payer: PPO | Admitting: Physical Therapy

## 2020-12-21 ENCOUNTER — Encounter: Payer: Self-pay | Admitting: Physical Therapy

## 2020-12-21 DIAGNOSIS — R262 Difficulty in walking, not elsewhere classified: Secondary | ICD-10-CM

## 2020-12-21 DIAGNOSIS — M25662 Stiffness of left knee, not elsewhere classified: Secondary | ICD-10-CM | POA: Diagnosis not present

## 2020-12-21 DIAGNOSIS — M25562 Pain in left knee: Secondary | ICD-10-CM | POA: Diagnosis not present

## 2020-12-21 DIAGNOSIS — R600 Localized edema: Secondary | ICD-10-CM | POA: Diagnosis not present

## 2020-12-21 NOTE — Therapy (Signed)
[place], [place], [address] Phone: [phone]   Fax:  [phone]  Physical Therapy Treatment  Patient Details  Name: Megan Valentine MRN: [id number] Date of Birth: 07/03/1950 Referring Provider (PT): Joni Fears, MD   Encounter Date: 12/21/2020   PT End of Session - 12/21/20 1517     Visit Number 6    Number of Visits 16    Date for PT Re-Evaluation 01/29/21    Authorization Type [employer]    Progress Note Due on Visit 10    PT Start Time 1510    PT Stop Time 1600    PT Time Calculation (min) 50 min    Activity Tolerance Patient limited by pain;Patient tolerated treatment well    Behavior During Therapy WFL for tasks assessed/performed             Past Medical History:  Diagnosis Date   Allergy    Arthritis    Asthma    Cataract    Chronic bronchitis ([place])    Chronic kidney disease    stage 3, kidney infections   Complication of anesthesia    hard to wake up   Diverticulosis    Environmental allergies    GERD (gastroesophageal reflux disease)    Hiatal hernia    History of chicken pox    Hyperlipidemia    Hypothyroidism    IBS (irritable bowel syndrome)    Migraines    Mitral valve prolapse    Per pt . per Dr.Henry  [name]  does not have MVP   Neuromuscular disorder ([place])    neuropathy feet   Pneumonia    Rectal prolapse     Past Surgical History:  Procedure Laterality Date   ABDOMINAL HYSTERECTOMY  1999   APPENDECTOMY  1962   BREAST CYST ASPIRATION Bilateral    CHOLECYSTECTOMY N/A 12/17/2018   Procedure: LAPAROSCOPIC CHOLECYSTECTOMY;  Surgeon: Clovis Riley, MD;  Location: [facility];  Service: General;  Laterality: N/A;   EYE SURGERY     cataract   JOINT REPLACEMENT     rt knee scope   REFRACTIVE SURGERY  2000   both eyes   retrocele repair     and bladder sling   TOTAL KNEE ARTHROPLASTY Right 03/24/2015   Procedure: TOTAL KNEE ARTHROPLASTY;  Surgeon: [name], MD;  Location: [facility];  Service: Orthopedics;  Laterality: Right;   TOTAL KNEE ARTHROPLASTY Left 11/17/2020   Procedure: LEFT TOTAL KNEE ARTHROPLASTY;  Surgeon: [name], MD;  [facility];  Service: Orthopedics;  Laterality: Left;   TUBAL LIGATION  1977    There were no vitals filed for this visit.   Subjective Assessment - 12/21/20 1511     Subjective Pt arriving to therapy reporting 3/10 pain in her left knee. Pt reporting not being able to sleep at night due to pain. Pt states she has to ice it on/off all night.    Pertinent History arthritis, asthma, cataract, CKD, GERD, mitral valve prolapse, neuromusclular disorder feet, IBS, diverticulosis, hysterectomy 1999, joint replacement right knee 2017    Diagnostic tests X-ray    Patient Stated Goals Walk without device,    Currently in Pain? Yes    Pain Score 3     Pain Location Knee    Pain Orientation Left    Pain Descriptors / Indicators Aching    Pain Type Surgical pain;Acute pain    Pain Onset 1 to 4 weeks ago  [facility] PT Assessment - 12/21/20 0001       Assessment   Medical Diagnosis M17.12    Referring Provider (PT) Joni Fears, MD      AROM   Left Knee Extension -6    Left Knee Flexion 106      PROM   Left Knee Extension -4    Left Knee Flexion 112                           [facility] Adult PT Treatment/Exercise - 12/21/20 0001       Neuro Re-ed    Neuro Re-ed Details  Airex: feet apart x 30 seconds with intermittent UE support, feet together with intermittent UE support, SLS on left LE on Airex with UE support.      Exercises   Exercises Knee/Hip      Knee/Hip Exercises: Stretches   Gastroc Stretch Left;2 reps;30 seconds    Gastroc Stretch Limitations slant board      Knee/Hip Exercises: Aerobic   Recumbent Bike partial revolutions progressing to full with seat at 5 x 8 minutes      Knee/Hip Exercises: Machines for Strengthening   Total Gym Leg  Press sled at 1, 50# double leg 3x10, left LE only 25# x10      Knee/Hip Exercises: Standing   Heel Raises Both;1 set;15 reps    Rocker Board 1 minute   ant/post & right/left     Knee/Hip Exercises: Seated   Long Arc Quad Left;2 sets;10 reps    Long Arc Quad Weight 3 lbs.      Modalities   Modalities Vasopneumatic      Vasopneumatic   Number Minutes Vasopneumatic  10 minutes    Vasopnuematic Location  Knee    Vasopneumatic Pressure Medium    Vasopneumatic Temperature  34                       PT Short Term Goals - 12/21/20 1607       PT SHORT TERM GOAL #1   Title Pt will be independent in her initial  HEP.    Status On-going      PT SHORT TERM GOAL #2   Title Pt will improve her 5 time sit to stand to </= 14 seconds with no UE support.    Status On-going               PT Long Term Goals - 12/01/20 1454       PT LONG TERM GOAL #1   Title Pt will be indepedent in advanced HEP.    Time 8    Period Weeks    Status New    Target Date 01/29/21      PT LONG TERM GOAL #2   Title Pt will improve her left knee AROM arc from 2-115 degrees.    Time 8    Period Weeks    Status New    Target Date 01/29/21      PT LONG TERM GOAL #3   Title Pt will be able to navigate 1 flight of stairs step over step with single hand rail.    Time 8    Period Weeks    Status New      PT LONG TERM GOAL #4   Title Pt will be able to report pain </= 2/10 with ADL's and home chores.    Time 8    Period  Weeks    Status New    Target Date 01/29/21      PT LONG TERM GOAL #5   Title Pt will improve her FOTO to >/= 60%.    Time 8    Period Weeks    Status New    Target Date 01/29/21                   Plan - 12/21/20 1517     Clinical Impression Statement Pt reporting 3-4 days soreness following her last PT visit. Pt tolerating exericses well today. Pt still concerned with tingling on her medial knee. Pt reporting improvements since starting therapy.  Continue skilled PT to maximize function.    Personal Factors and Comorbidities Comorbidity 3+    Comorbidities arthritis, asthma, cataract, CKD, GERD, mitral valve prolapse, neuromusclular disorder feet, IBS, diverticulosis, hysterectomy 1999, joint replacement right knee 2017    Examination-Activity Limitations Lift;Transfers;Other;Sit;Squat;Stand;Stairs;Dressing    Examination-Participation Restrictions Community Activity;Other;Yard Work;Driving    Stability/Clinical Decision Making Stable/Uncomplicated    Rehab Potential Good    PT Frequency 2x / week    PT Duration 8 weeks    PT Treatment/Interventions ADLs/Self Care Home Management;Cryotherapy;Electrical Stimulation;Ultrasound;Gait training;Stair training;Functional mobility training;Therapeutic activities;Therapeutic exercise;Balance training;Neuromuscular re-education;Patient/family education;Passive range of motion;Dry needling;Taping;Manual techniques;Vasopneumatic Device    PT Next Visit Plan LE strenghtening including standing functional, manual therapy & exercises to increase knee ROM    PT Home Exercise Plan Access Code: [email or web address]    Consulted and Agree with Plan of Care Patient             Patient will benefit from skilled therapeutic intervention in order to improve the following deficits and impairments:  Decreased mobility, Decreased strength, Postural dysfunction, Decreased balance, Impaired flexibility, Pain, Decreased activity tolerance, Difficulty walking, Abnormal gait  Visit Diagnosis: Acute pain of left knee  Difficulty in walking, not elsewhere classified  Localized edema  Stiffness of left knee, not elsewhere classified     Problem List Patient Active Problem List   Diagnosis Date Noted   Macrocytosis 12/07/2020   S/P TKR (total knee replacement) using cement, left 11/17/2020   Chronic pain of right knee 09/30/2020   Medication side effect 09/30/2020   Statin intolerance 06/30/2020   Trigger point  of shoulder region, left 02/25/2020   Elevated glucose 08/15/2019   Anemia 02/15/2019   Abdominal pain 12/14/2018   Sinus pressure 11/12/2018   History of 2019 novel coronavirus disease (COVID-19) 11/12/2018   Pleurisy 06/05/2018   Enterocele 06/05/2018   Rectocele 05/23/2018   Vaginal vault prolapse 05/23/2018   SUI (stress urinary incontinence, female) 05/23/2018   Asthma 05/07/2018   Stage 3a chronic kidney disease ([place]) 01/15/2018   Edema 12/15/2017   Insomnia 11/20/2017   Seborrheic dermatitis 11/16/2017   Seasonal allergic rhinitis due to pollen 11/16/2017   Menopausal and female climacteric states 10/19/2017   Constipation 03/26/2015   Unilateral primary osteoarthritis, left knee 11/17/2014   Healthcare maintenance 04/25/2014   Hypokalemia 09/15/2011   Hypothyroidism 08/21/2008   Mixed hyperlipidemia 08/21/2008    Oretha Caprice, PT, MPT 12/21/2020, 4:24 PM  [facility] [address] [place], [place], [address] Phone: [phone]   Fax:  [phone]  Name: Megan Valentine MRN: [id number] Date of Birth: July 18, 1950

## 2020-12-23 ENCOUNTER — Other Ambulatory Visit: Payer: Self-pay

## 2020-12-23 ENCOUNTER — Encounter: Payer: Self-pay | Admitting: Physical Therapy

## 2020-12-23 ENCOUNTER — Ambulatory Visit: Payer: PPO | Admitting: Physical Therapy

## 2020-12-23 DIAGNOSIS — M25662 Stiffness of left knee, not elsewhere classified: Secondary | ICD-10-CM | POA: Diagnosis not present

## 2020-12-23 DIAGNOSIS — R262 Difficulty in walking, not elsewhere classified: Secondary | ICD-10-CM | POA: Diagnosis not present

## 2020-12-23 DIAGNOSIS — M25562 Pain in left knee: Secondary | ICD-10-CM

## 2020-12-23 DIAGNOSIS — R600 Localized edema: Secondary | ICD-10-CM | POA: Diagnosis not present

## 2020-12-23 NOTE — Patient Instructions (Signed)
Access Code: [email or web address] URL: [email or web address] Date: 12/23/2020 Prepared by: Faustino Congress  Exercises Long Sitting Quad Set with Towel Roll Under Heel - 3 x daily - 7 x weekly - 2 sets - 10 reps - 5 seconds hold Supine Leg Press - 3 x daily - 7 x weekly - 2 sets - 10 reps - 5 seconds hold Ankle Alphabet in Elevation - 3 x daily - 7 x weekly - 2 sets - 1 reps Supine Bridge - 3 x daily - 7 x weekly - 2 sets - 10 reps Supine Active Straight Leg Raise - 3 x daily - 7 x weekly - 2 sets - 10 reps Supine Heel Slide with Strap - 3 x daily - 7 x weekly - 10 reps - 10 second hold Seated Knee Flexion AAROM - 3 x daily - 7 x weekly - 10 reps - 10 seconds hold Seated Hamstring Stretch with Strap - 2-3 x daily - 7 x weekly - 1 sets - 3 reps - 30 seconds hold Seated Knee Flexion Extension AROM - 2-3 x daily - 7 x weekly - 2 sets - 10 reps - 5 seconds hold Seated Long Arc Quad - 3 x daily - 7 x weekly - 2 sets - 10 reps - 3-5 seconds hold Supine Hamstring Stretch with Strap - 2-3 x daily - 7 x weekly - 1 sets - 3 reps - 30 sec hold

## 2020-12-23 NOTE — Therapy (Signed)
[facility] [place], [place], [address] Phone: [phone]   Fax:  [phone]  Physical Therapy Treatment  Patient Details  Name: Megan Valentine MRN: [id number] Date of Birth: 03-23-1950 Referring Provider (PT): Joni Fears, MD   Encounter Date: 12/23/2020   PT End of Session - 12/23/20 1431     Visit Number 7    Number of Visits 16    Date for PT Re-Evaluation 01/29/21    Authorization Type [employer]    Progress Note Due on Visit 10    PT Start Time 5885    PT Stop Time 1436    PT Time Calculation (min) 51 min    Activity Tolerance Patient limited by pain;Patient tolerated treatment well    Behavior During Therapy WFL for tasks assessed/performed             Past Medical History:  Diagnosis Date   Allergy    Arthritis    Asthma    Cataract    Chronic bronchitis ([place])    Chronic kidney disease    stage 3, kidney infections   Complication of anesthesia    hard to wake up   Diverticulosis    Environmental allergies    GERD (gastroesophageal reflux disease)    Hiatal hernia    History of chicken pox    Hyperlipidemia    Hypothyroidism    IBS (irritable bowel syndrome)    Migraines    Mitral valve prolapse    Per pt . per Dr.Henry  [name]  does not have MVP   Neuromuscular disorder ([place])    neuropathy feet   Pneumonia    Rectal prolapse     Past Surgical History:  Procedure Laterality Date   ABDOMINAL HYSTERECTOMY  1999   APPENDECTOMY  1962   BREAST CYST ASPIRATION Bilateral    CHOLECYSTECTOMY N/A 12/17/2018   Procedure: LAPAROSCOPIC CHOLECYSTECTOMY;  Surgeon: Clovis Riley, MD;  Location: [facility];  Service: General;  Laterality: N/A;   EYE SURGERY     cataract   JOINT REPLACEMENT     rt knee scope   REFRACTIVE SURGERY  2000   both eyes   retrocele repair     and bladder sling   TOTAL KNEE ARTHROPLASTY Right 03/24/2015   Procedure: TOTAL KNEE ARTHROPLASTY;  Surgeon: [name], MD;  Location: [facility];  Service: Orthopedics;  Laterality: Right;   TOTAL KNEE ARTHROPLASTY Left 11/17/2020   Procedure: LEFT TOTAL KNEE ARTHROPLASTY;  Surgeon: [name], MD;  Location: [facility];  Service: Orthopedics;  Laterality: Left;   TUBAL LIGATION  1977    There were no vitals filed for this visit.   Subjective Assessment - 12/23/20 1351     Subjective doing well; took pain meds and muscle relaxer prior to session.  has "hot,burning pain at night" and still having to take meds    Pertinent History arthritis, asthma, cataract, CKD, GERD, mitral valve prolapse, neuromusclular disorder feet, IBS, diverticulosis, hysterectomy 1999, joint replacement right knee 2017    Diagnostic tests X-ray    Patient Stated Goals Walk without device,    Currently in Pain? Yes    Pain Score 3     Pain Location Knee    Pain Orientation Left    Pain Descriptors / Indicators Aching    Pain Type Acute pain;Surgical pain    Pain Onset 1 to 4 weeks ago    Pain Frequency Intermittent    Aggravating Factors  riding in car, sitting with knee bent    Pain Relieving Factors meds, ice                               [facility] Adult PT Treatment/Exercise - 12/23/20 1354       Knee/Hip Exercises: Stretches   Passive Hamstring Stretch Left;2 reps;30 seconds    Passive Hamstring Stretch Limitations supine with strap and overpressure at distal thigh      Knee/Hip Exercises: Aerobic   Recumbent Bike full revolutions at seat 6; L1 x 8 min      Knee/Hip Exercises: Machines for Strengthening   Total Gym Leg Press sled at 1, 50# double leg 3x10, left LE only 25# 2x10      Knee/Hip Exercises: Standing   Lateral Step Up Left;10 reps;Hand Hold: 2;Step Height: 6"    Forward Step Up Left;10 reps;Hand Hold: 1;Step Height: 6"      Knee/Hip Exercises: Seated   Long Arc Quad Left;2 sets;10 reps    Long Arc Quad Weight 3 lbs.      Vasopneumatic   Number Minutes Vasopneumatic  10  minutes    Vasopnuematic Location  Knee    Vasopneumatic Pressure Low    Vasopneumatic Temperature  34      Manual Therapy   Manual therapy comments supine Lt knee extension                     PT Education - 12/23/20 1430     Education Details added hamstring stretch    Person(s) Educated Patient    Methods Explanation;Demonstration;Handout    Comprehension Verbalized understanding;Returned demonstration;Need further instruction              PT Short Term Goals - 12/21/20 1607       PT SHORT TERM GOAL #1   Title Pt will be independent in her initial  HEP.    Status On-going      PT SHORT TERM GOAL #2   Title Pt will improve her 5 time sit to stand to </= 14 seconds with no UE support.    Status On-going               PT Long Term Goals - 12/01/20 1454       PT LONG TERM GOAL #1   Title Pt will be indepedent in advanced HEP.    Time 8    Period Weeks    Status New    Target Date 01/29/21      PT LONG TERM GOAL #2   Title Pt will improve her left knee AROM arc from 2-115 degrees.    Time 8    Period Weeks    Status New    Target Date 01/29/21      PT LONG TERM GOAL #3   Title Pt will be able to navigate 1 flight of stairs step over step with single hand rail.    Time 8    Period Weeks    Status New      PT LONG TERM GOAL #4   Title Pt will be able to report pain </= 2/10 with ADL's and home chores.    Time 8    Period Weeks    Status New    Target Date 01/29/21      PT LONG TERM GOAL #5   Title Pt will improve her FOTO to >/= 60%.  Time 8    Period Weeks    Status New    Target Date 01/29/21                   Plan - 12/23/20 1431     Clinical Impression Statement Pt demonstrating steady progress with PT and working on improving functional mobility and strength.  Still lacking TKE due to quad weakness and decreased ROM at this time.  Will continue to benefit from PT to maximize function.    Personal Factors and  Comorbidities Comorbidity 3+    Comorbidities arthritis, asthma, cataract, CKD, GERD, mitral valve prolapse, neuromusclular disorder feet, IBS, diverticulosis, hysterectomy 1999, joint replacement right knee 2017    Examination-Activity Limitations Lift;Transfers;Other;Sit;Squat;Stand;Stairs;Dressing    Examination-Participation Restrictions Community Activity;Other;Yard Work;Driving    Stability/Clinical Decision Making Stable/Uncomplicated    Rehab Potential Good    PT Frequency 2x / week    PT Duration 8 weeks    PT Treatment/Interventions ADLs/Self Care Home Management;Cryotherapy;Electrical Stimulation;Ultrasound;Gait training;Stair training;Functional mobility training;Therapeutic activities;Therapeutic exercise;Balance training;Neuromuscular re-education;Patient/family education;Passive range of motion;Dry needling;Taping;Manual techniques;Vasopneumatic Device    PT Next Visit Plan LE strenghtening including standing functional, manual therapy & exercises to increase knee ROM; extension focus, capture FOTO    PT Home Exercise Plan Access Code: [email or web address]    Consulted and Agree with Plan of Care Patient             Patient will benefit from skilled therapeutic intervention in order to improve the following deficits and impairments:  Decreased mobility, Decreased strength, Postural dysfunction, Decreased balance, Impaired flexibility, Pain, Decreased activity tolerance, Difficulty walking, Abnormal gait  Visit Diagnosis: Acute pain of left knee  Difficulty in walking, not elsewhere classified  Localized edema  Stiffness of left knee, not elsewhere classified     Problem List Patient Active Problem List   Diagnosis Date Noted   Macrocytosis 12/07/2020   S/P TKR (total knee replacement) using cement, left 11/17/2020   Chronic pain of right knee 09/30/2020   Medication side effect 09/30/2020   Statin intolerance 06/30/2020   Trigger point of shoulder region, left 02/25/2020    Elevated glucose 08/15/2019   Anemia 02/15/2019   Abdominal pain 12/14/2018   Sinus pressure 11/12/2018   History of 2019 novel coronavirus disease (COVID-19) 11/12/2018   Pleurisy 06/05/2018   Enterocele 06/05/2018   Rectocele 05/23/2018   Vaginal vault prolapse 05/23/2018   SUI (stress urinary incontinence, female) 05/23/2018   Asthma 05/07/2018   Stage 3a chronic kidney disease ([place]) 01/15/2018   Edema 12/15/2017   Insomnia 11/20/2017   Seborrheic dermatitis 11/16/2017   Seasonal allergic rhinitis due to pollen 11/16/2017   Menopausal and female climacteric states 10/19/2017   Constipation 03/26/2015   Unilateral primary osteoarthritis, left knee 11/17/2014   Healthcare maintenance 04/25/2014   Hypokalemia 09/15/2011   Hypothyroidism 08/21/2008   Mixed hyperlipidemia 08/21/2008      Laureen Abrahams, PT, DPT 12/23/20 2:33 PM      [facility] [address] [place], [place], [address] Phone: [phone]   Fax:  [phone]  Name: Megan Valentine MRN: [id number] Date of Birth: [birth year]

## 2020-12-24 ENCOUNTER — Other Ambulatory Visit: Payer: Self-pay | Admitting: Physician Assistant

## 2020-12-24 ENCOUNTER — Telehealth: Payer: Self-pay | Admitting: *Deleted

## 2020-12-24 MED ORDER — GABAPENTIN 100 MG PO CAPS: 100.0000 mg | ORAL_CAPSULE | Freq: Every day | ORAL | 1 refills | Status: DC

## 2020-12-24 MED ORDER — OXYCODONE-ACETAMINOPHEN 5-325 MG PO TABS: 1.0000 | ORAL_TABLET | Freq: Three times a day (TID) | ORAL | 0 refills | Status: DC | PRN

## 2020-12-24 NOTE — Telephone Encounter (Signed)
Called in refill and also low dose of gabapentin to take at night. Can increase the dose of gabapentin if not effective enough

## 2020-12-24 NOTE — Telephone Encounter (Signed)
Patient called requesting refill of pain medication. Taking only very sparingly "maybe two per day". Having sensations of "hot poker with electric sensation" running up and down inside of leg from knee. Any other recommendations? Thanks.

## 2020-12-28 ENCOUNTER — Ambulatory Visit: Payer: PPO | Admitting: Physical Therapy

## 2020-12-28 ENCOUNTER — Other Ambulatory Visit: Payer: Self-pay

## 2020-12-28 DIAGNOSIS — R600 Localized edema: Secondary | ICD-10-CM | POA: Diagnosis not present

## 2020-12-28 DIAGNOSIS — M25662 Stiffness of left knee, not elsewhere classified: Secondary | ICD-10-CM | POA: Diagnosis not present

## 2020-12-28 DIAGNOSIS — R262 Difficulty in walking, not elsewhere classified: Secondary | ICD-10-CM

## 2020-12-28 DIAGNOSIS — M25562 Pain in left knee: Secondary | ICD-10-CM | POA: Diagnosis not present

## 2020-12-28 NOTE — Therapy (Signed)
[facility] [place], [place], [address] Phone: [phone]   Fax:  [phone]  Physical Therapy Treatment  Patient Details  Name: Megan Valentine MRN: [id number] Date of Birth: 1951/01/17 Referring Provider (PT): Joni Fears, MD   Encounter Date: 12/28/2020   PT End of Session - 12/28/20 1405     Visit Number 8    Number of Visits 16    Date for PT Re-Evaluation 01/29/21    Authorization Type [employer]    PT Start Time 4742    PT Stop Time 1425    PT Time Calculation (min) 40 min    Activity Tolerance Patient limited by pain;Patient tolerated treatment well    Behavior During Therapy WFL for tasks assessed/performed             Past Medical History:  Diagnosis Date   Allergy    Arthritis    Asthma    Cataract    Chronic bronchitis ([place])    Chronic kidney disease    stage 3, kidney infections   Complication of anesthesia    hard to wake up   Diverticulosis    Environmental allergies    GERD (gastroesophageal reflux disease)    Hiatal hernia    History of chicken pox    Hyperlipidemia    Hypothyroidism    IBS (irritable bowel syndrome)    Migraines    Mitral valve prolapse    Per pt . per Dr.Henry  [name]  does not have MVP   Neuromuscular disorder ([place])    neuropathy feet   Pneumonia    Rectal prolapse     Past Surgical History:  Procedure Laterality Date   ABDOMINAL HYSTERECTOMY  1999   APPENDECTOMY  1962   BREAST CYST ASPIRATION Bilateral    CHOLECYSTECTOMY N/A 12/17/2018   Procedure: LAPAROSCOPIC CHOLECYSTECTOMY;  Surgeon: Clovis Riley, MD;  Location: [facility];  Service: General;  Laterality: N/A;   EYE SURGERY     cataract   JOINT REPLACEMENT     rt knee scope   REFRACTIVE SURGERY  2000   both eyes   retrocele repair     and bladder sling   TOTAL KNEE ARTHROPLASTY Right 03/24/2015   Procedure: TOTAL KNEE ARTHROPLASTY;  Surgeon: [name], MD;  Location: [facility];   Service: Orthopedics;  Laterality: Right;   TOTAL KNEE ARTHROPLASTY Left 11/17/2020   Procedure: LEFT TOTAL KNEE ARTHROPLASTY;  Surgeon: [name], MD;  Location: [facility];  Service: Orthopedics;  Laterality: Left;   TUBAL LIGATION  1977    There were no vitals filed for this visit.   Subjective Assessment - 12/28/20 1358     Subjective Pt arriving to therapy reporting 4/10 left knee pain. Pt feels that  she may have twisted her left knee when sleeping.    Pertinent History arthritis, asthma, cataract, CKD, GERD, mitral valve prolapse, neuromusclular disorder feet, IBS, diverticulosis, hysterectomy 1999, joint replacement right knee 2017    Patient Stated Goals Walk without device,    Currently in Pain? Yes    Pain Score 4     Pain Location Knee    Pain Orientation Left    Pain Descriptors / Indicators Aching    Pain Type Acute pain    Pain Onset 1 to 4 weeks ago                [facility] PT Assessment - 12/28/20 0001       Assessment  Medical Diagnosis M17.12    Referring Provider (PT) Joni Fears, MD      Observation/Other Assessments   Focus on Therapeutic Outcomes (FOTO)  61%      AROM   Left Knee Extension -6    Left Knee Flexion 110      PROM   Left Knee Extension -5    Left Knee Flexion 116                           [facility] Adult PT Treatment/Exercise - 12/28/20 0001       Ambulation/Gait   Gait Comments heel to toe gait training with no device on level surfaces      Neuro Re-ed    Neuro Re-ed Details  Airex: SLS on left intermittent UE support      Exercises   Exercises Knee/Hip      Knee/Hip Exercises: Stretches   Gastroc Stretch 2 reps;30 seconds    Gastroc Stretch Limitations slant board, using a towel roll      Knee/Hip Exercises: Aerobic   Recumbent Bike full revolutions at seat 6; L3 x 8 min   UBE/ bike combo only using LE's     Knee/Hip Exercises: Machines for Strengthening   Total Gym Leg Press sled at 1, 50#  double leg 3x10, left LE only 25# 2x10      Knee/Hip Exercises: Standing   Lateral Step Up Left;10 reps;Hand Hold: 2;Step Height: 6"    Forward Step Up Left;10 reps;Hand Hold: 1;Step Height: 6"      Knee/Hip Exercises: Seated   Long Arc Quad Left;2 sets;10 reps    Long Arc Quad Weight 4 lbs.    Other Seated Knee/Hip Exercises seated SLR      Knee/Hip Exercises: Supine   Bridges Strengthening;Both;10 reps    Bridges Limitations holding 5 seconds                     PT Education - 12/28/20 1611     Education Details discussed heel to toe gait pattern and calf stretches to prevent onset of plantar fascitis.    Person(s) Educated Patient    Methods Explanation;Demonstration    Comprehension Verbalized understanding;Returned demonstration              PT Short Term Goals - 12/28/20 1412       PT SHORT TERM GOAL #1   Title Pt will be independent in her initial  HEP.    Status Achieved      PT SHORT TERM GOAL #2   Title Pt will improve her 5 time sit to stand to </= 14 seconds with no UE support.               PT Long Term Goals - 12/28/20 1613       PT LONG TERM GOAL #1   Title Pt will be indepedent in advanced HEP.    Status On-going      PT LONG TERM GOAL #2   Title Pt will improve her left knee AROM arc from 2-115 degrees.    Status On-going      PT LONG TERM GOAL #3   Title Pt will be able to navigate 1 flight of stairs step over step with single hand rail.    Status On-going      PT LONG TERM GOAL #4   Title Pt will be able to report pain </= 2/10 with ADL's and  home chores.    Status On-going      PT LONG TERM GOAL #5   Title Pt will improve her FOTO to >/= 60%.    Baseline 61% on 12/28/2020    Status Achieved                   Plan - 12/28/20 1407     Clinical Impression Statement Pt tolerating exericses well with pain reported at end ranges of flexion and extension. Pt has improved her Foto score to 61% from 41%. Pt's  PROM in knee flexion is 116 degrees.  Pt still progressing with left quad strength and overall ROM    Comorbidities arthritis, asthma, cataract, CKD, GERD, mitral valve prolapse, neuromusclular disorder feet, IBS, diverticulosis, hysterectomy 1999, joint replacement right knee 2017    Examination-Activity Limitations Lift;Transfers;Other;Sit;Squat;Stand;Stairs;Dressing    Examination-Participation Restrictions Community Activity;Other;Yard Work;Driving    Stability/Clinical Decision Making Stable/Uncomplicated    Rehab Potential Good    PT Duration 8 weeks    PT Treatment/Interventions ADLs/Self Care Home Management;Cryotherapy;Electrical Stimulation;Ultrasound;Gait training;Stair training;Functional mobility training;Therapeutic activities;Therapeutic exercise;Balance training;Neuromuscular re-education;Patient/family education;Passive range of motion;Dry needling;Taping;Manual techniques;Vasopneumatic Device    PT Next Visit Plan LE strenghtening including standing functional, manual therapy & exercises to increase knee ROM; extension    PT Home Exercise Plan Access Code: [email or web address]    Consulted and Agree with Plan of Care Patient             Patient will benefit from skilled therapeutic intervention in order to improve the following deficits and impairments:  Decreased mobility, Decreased strength, Postural dysfunction, Decreased balance, Impaired flexibility, Pain, Decreased activity tolerance, Difficulty walking, Abnormal gait  Visit Diagnosis: Acute pain of left knee  Difficulty in walking, not elsewhere classified  Localized edema  Stiffness of left knee, not elsewhere classified     Problem List Patient Active Problem List   Diagnosis Date Noted   Macrocytosis 12/07/2020   S/P TKR (total knee replacement) using cement, left 11/17/2020   Chronic pain of right knee 09/30/2020   Medication side effect 09/30/2020   Statin intolerance 06/30/2020   Trigger point of shoulder  region, left 02/25/2020   Elevated glucose 08/15/2019   Anemia 02/15/2019   Abdominal pain 12/14/2018   Sinus pressure 11/12/2018   History of 2019 novel coronavirus disease (COVID-19) 11/12/2018   Pleurisy 06/05/2018   Enterocele 06/05/2018   Rectocele 05/23/2018   Vaginal vault prolapse 05/23/2018   SUI (stress urinary incontinence, female) 05/23/2018   Asthma 05/07/2018   Stage 3a chronic kidney disease ([place]) 01/15/2018   Edema 12/15/2017   Insomnia 11/20/2017   Seborrheic dermatitis 11/16/2017   Seasonal allergic rhinitis due to pollen 11/16/2017   Menopausal and female climacteric states 10/19/2017   Constipation 03/26/2015   Unilateral primary osteoarthritis, left knee 11/17/2014   Healthcare maintenance 04/25/2014   Hypokalemia 09/15/2011   Hypothyroidism 08/21/2008   Mixed hyperlipidemia 08/21/2008    Oretha Caprice, PT, MPT 12/28/2020, [facility] [address] [place], [place], [address] Phone: [phone]   Fax:  [phone]  Name: Megan Valentine MRN: [id number] Date of Birth: 1950/09/29

## 2020-12-30 ENCOUNTER — Other Ambulatory Visit: Payer: Self-pay

## 2020-12-30 ENCOUNTER — Encounter: Payer: Self-pay | Admitting: Family Medicine

## 2020-12-30 ENCOUNTER — Ambulatory Visit (INDEPENDENT_AMBULATORY_CARE_PROVIDER_SITE_OTHER): Payer: PPO | Admitting: Family Medicine

## 2020-12-30 VITALS — BP 122/78 | HR 84 | Temp 97.4°F | Ht 64.0 in | Wt 152.8 lb

## 2020-12-30 DIAGNOSIS — R7309 Other abnormal glucose: Secondary | ICD-10-CM

## 2020-12-30 DIAGNOSIS — E876 Hypokalemia: Secondary | ICD-10-CM

## 2020-12-30 DIAGNOSIS — E039 Hypothyroidism, unspecified: Secondary | ICD-10-CM | POA: Diagnosis not present

## 2020-12-30 DIAGNOSIS — E782 Mixed hyperlipidemia: Secondary | ICD-10-CM

## 2020-12-30 DIAGNOSIS — Z789 Other specified health status: Secondary | ICD-10-CM | POA: Diagnosis not present

## 2020-12-30 LAB — BASIC METABOLIC PANEL
BUN: 16 mg/dL (ref 6–23)
CO2: 26 mEq/L (ref 19–32)
Calcium: 9.3 mg/dL (ref 8.4–10.5)
Chloride: 105 mEq/L (ref 96–112)
Creatinine, Ser: 0.95 mg/dL (ref 0.40–1.20)
GFR: 60.58 mL/min (ref >60.00)
Glucose, Bld: 94 mg/dL (ref 70–99)
Potassium: 3.3 mEq/L — ABNORMAL LOW (ref 3.5–5.1)
Sodium: 141 mEq/L (ref 135–145)

## 2020-12-30 LAB — TSH: TSH: 4.31 u[IU]/mL (ref 0.35–5.50)

## 2020-12-30 MED ORDER — EZETIMIBE 10 MG PO TABS: 10.0000 mg | ORAL_TABLET | Freq: Every day | ORAL | 3 refills | Status: DC

## 2020-12-30 NOTE — Progress Notes (Addendum)
Established Patient Office Visit  Subjective:  Patient ID: Megan Valentine, female    DOB: April 06, 1950  Age: 70 y.o. MRN: [id number]  CC:  Chief Complaint  Patient presents with   Follow-up    Follow up on TSH, no concerns. Patient fasting.     HPI [name] presents for follow-up of hypothyroidism, hypokalemia, elevated LDL cholesterol with a favorable HDL and intermediate ASCVD risk score.  Recovering well from recent left knee replacement.  Continues with physical therapy.  Past Medical History:  Diagnosis Date   Allergy    Arthritis    Asthma    Cataract    Chronic bronchitis ([place])    Chronic kidney disease    stage 3, kidney infections   Complication of anesthesia    hard to wake up   Diverticulosis    Environmental allergies    GERD (gastroesophageal reflux disease)    Hiatal hernia    History of chicken pox    Hyperlipidemia    Hypothyroidism    IBS (irritable bowel syndrome)    Migraines    Mitral valve prolapse    Per pt . per Dr.Henry  [name].  does not have MVP   Neuromuscular disorder ([place])    neuropathy feet   Pneumonia    Rectal prolapse     Past Surgical History:  Procedure Laterality Date   ABDOMINAL HYSTERECTOMY  1999   APPENDECTOMY  1962   BREAST CYST ASPIRATION Bilateral    CHOLECYSTECTOMY N/A 12/17/2018   Procedure: LAPAROSCOPIC CHOLECYSTECTOMY;  Surgeon: Clovis Riley, MD;  Location: [facility];  Service: General;  Laterality: N/A;   EYE SURGERY     cataract   JOINT REPLACEMENT     rt knee scope   REFRACTIVE SURGERY  2000   both eyes   retrocele repair     and bladder sling   TOTAL KNEE ARTHROPLASTY Right 03/24/2015   Procedure: TOTAL KNEE ARTHROPLASTY;  Surgeon: [name], MD;  Location: [facility];  Service: Orthopedics;  Laterality: Right;   TOTAL KNEE ARTHROPLASTY Left 11/17/2020   Procedure: LEFT TOTAL KNEE ARTHROPLASTY;  Surgeon: [name], MD;  Location: [facility];  Service: Orthopedics;  Laterality: Left;   TUBAL  LIGATION  1977    Family History  Problem Relation Age of Onset   Stomach cancer Maternal Grandmother    Hypertension Maternal Grandmother    Diabetes Maternal Grandmother    Stroke Maternal Grandmother    Cancer Maternal Grandmother        Stomach cancer   Colon cancer Paternal Grandfather    Heart attack Paternal Grandfather    Hyperlipidemia Mother    Diabetes Mother    Hypertension Mother    Alzheimer's disease Mother    Diabetes Father    Hyperlipidemia Father    Heart disease Father    Heart attack Father    Stroke Paternal Grandmother     Social History   Socioeconomic History   Marital status: Widowed    Spouse name: Not on file   Number of children: Not on file   Years of education: 12   Highest education level: Not on file  Occupational History   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    Employer: [employer]  Tobacco Use   Smoking status: Former    Types: Cigarettes    Quit date: 03/23/1976    Years since quitting: 44.8   Smokeless tobacco: Never  Vaping Use  Vaping Use: Never used  Substance and Sexual Activity   Alcohol use: No   Drug use: No   Sexual activity: Yes    Birth control/protection: None, Post-menopausal  Other Topics Concern   Not on file  Social History Narrative   Caffeine Use-yes   Regular exercise-no   Right handed    Lives alone   Social Determinants of Health   Financial Resource Strain: Not on file  Food Insecurity: Not on file  Transportation Needs: Not on file  Physical Activity: Not on file  Stress: Not on file  Social Connections: Not on file  Intimate Partner Violence: Not on file    Outpatient Medications Prior to Visit  Medication Sig Dispense Refill   acetaminophen (TYLENOL) 500 MG tablet Take 1 tablet (500 mg total) by mouth every 6 (six) hours. 30 tablet 0   aspirin 81 MG chewable tablet Chew 1 tablet (81 mg total) by mouth 2 (two) times daily.     estradiol (ESTRACE) 0.5 MG tablet TAKE 1  TABLET(0.5 MG) BY MOUTH DAILY 90 tablet 2   gabapentin (NEURONTIN) 100 MG capsule Take 1 capsule (100 mg total) by mouth at bedtime. 30 capsule 1   Ginkgo Biloba 120 MG CAPS Take 120 mg by mouth in the morning.     hydroxypropyl methylcellulose / hypromellose (ISOPTO TEARS / GONIOVISC) 2.5 % ophthalmic solution Place 1 drop into both eyes 4 (four) times daily as needed for dry eyes.     loratadine (CLARITIN) 10 MG tablet Take 10 mg by mouth daily as needed for allergies.     methocarbamol (ROBAXIN) 500 MG tablet Take 1 tablet (500 mg total) by mouth every 12 (twelve) hours as needed for muscle spasms. 30 tablet 1   Multiple Vitamins-Minerals (HAIR/SKIN/NAILS/BIOTIN PO) Take 3 tablets by mouth in the morning.     oxyCODONE (OXY IR/ROXICODONE) 5 MG immediate release tablet Take 1 tablet (5 mg total) by mouth every 4 (four) hours as needed for moderate pain (pain score 4-6). 30 tablet 0   oxyCODONE-acetaminophen (PERCOCET/ROXICET) 5-325 MG tablet Take 1 tablet by mouth every 8 (eight) hours as needed for severe pain. 30 tablet 0   levothyroxine (SYNTHROID) 112 MCG tablet TAKE 1 TABLET(112 MCG) BY MOUTH DAILY 90 tablet 0   methocarbamol (ROBAXIN) 500 MG tablet Take 1 tablet (500 mg total) by mouth every 6 (six) hours as needed for muscle spasms. 30 tablet 0   ondansetron (ZOFRAN) 4 MG tablet Take 1 tablet (4 mg total) by mouth every 6 (six) hours as needed for nausea. (Patient not taking: Reported on 12/30/2020) 20 tablet 0   potassium chloride SA (KLOR-CON) 20 MEQ tablet Take 1 tablet (20 mEq total) by mouth daily. (Patient not taking: Reported on 12/30/2020) 30 tablet 3   No facility-administered medications prior to visit.    Allergies  Allergen Reactions   Tetracycline Hives and Itching   Dilaudid [Hydromorphone Hcl] Nausea And Vomiting   Other     Cigarette smoke and perfumes--flares asthma (develops bronchitis)    ROS Review of Systems  Constitutional:  Negative for chills, diaphoresis,  fatigue, fever and unexpected weight change.  HENT: Negative.    Eyes:  Negative for photophobia and visual disturbance.  Respiratory: Negative.    Cardiovascular: Negative.   Gastrointestinal: Negative.   Endocrine: Negative for cold intolerance and heat intolerance.  Genitourinary: Negative.   Musculoskeletal:  Positive for arthralgias and gait problem.  Psychiatric/Behavioral: Negative.       Objective:  Physical Exam Vitals and nursing note reviewed.  Constitutional:      General: She is not in acute distress.    Appearance: Normal appearance. She is not ill-appearing, toxic-appearing or diaphoretic.  HENT:     Head: Normocephalic and atraumatic.     Right Ear: External ear normal.     Left Ear: External ear normal.     Mouth/Throat:     Mouth: Mucous membranes are moist.     Pharynx: Oropharynx is clear. No oropharyngeal exudate or posterior oropharyngeal erythema.  Eyes:     General: No scleral icterus.       Right eye: No discharge.        Left eye: No discharge.     Extraocular Movements: Extraocular movements intact.     Conjunctiva/sclera: Conjunctivae normal.     Pupils: Pupils are equal, round, and reactive to light.  Cardiovascular:     Rate and Rhythm: Normal rate and regular rhythm.  Pulmonary:     Effort: Pulmonary effort is normal.     Breath sounds: Normal breath sounds.  Abdominal:     General: Bowel sounds are normal.  Musculoskeletal:     Cervical back: No rigidity or tenderness.     Left knee: Swelling present.     Right lower leg: No edema.     Left lower leg: No edema.  Lymphadenopathy:     Cervical: No cervical adenopathy.  Skin:    General: Skin is warm and dry.  Neurological:     Mental Status: She is alert.  Psychiatric:        Mood and Affect: Mood normal.        Behavior: Behavior normal.    BP 122/78 (BP Location: Right Arm, Patient Position: Sitting, Cuff Size: Normal)   Pulse 84   Temp (!) 97.4 F (36.3 C) (Temporal)   Ht  5\' 4"  (1.626 m)   Wt 152 lb 12.8 oz (69.3 kg)   SpO2 99%   BMI 26.23 kg/m  Wt Readings from Last 3 Encounters:  12/30/20 152 lb 12.8 oz (69.3 kg)  12/07/20 155 lb 12.8 oz (70.7 kg)  11/17/20 153 lb (69.4 kg)     There are no preventive care reminders to display for this patient.  There are no preventive care reminders to display for this patient.  Lab Results  Component Value Date   TSH 4.31 12/30/2020   Lab Results  Component Value Date   WBC 7.9 11/17/2020   HGB 13.5 11/17/2020   HCT 43.5 11/17/2020   MCV 104.6 (H) 11/17/2020   PLT 317 11/17/2020   Lab Results  Component Value Date   NA 141 12/30/2020   K 3.3 (L) 12/30/2020   CO2 26 12/30/2020   GLUCOSE 94 12/30/2020   BUN 16 12/30/2020   CREATININE 0.95 12/30/2020   BILITOT 0.4 09/30/2020   ALKPHOS 57 09/30/2020   AST 16 09/30/2020   ALT 15 09/30/2020   PROT 7.0 09/30/2020   ALBUMIN 4.1 09/30/2020   CALCIUM 9.3 12/30/2020   ANIONGAP 4 (L) 11/20/2020   GFR 60.58 12/30/2020   Lab Results  Component Value Date   CHOL 249 (H) 09/30/2020   Lab Results  Component Value Date   HDL 66.60 09/30/2020   Lab Results  Component Value Date   LDLCALC 155 (H) 09/30/2020   Lab Results  Component Value Date   TRIG 135.0 09/30/2020   Lab Results  Component Value Date   CHOLHDL 4 09/30/2020  Lab Results  Component Value Date   HGBA1C 5.6 12/07/2020   The 10-year ASCVD risk score (Arnett DK, et al., 2019) is: 8.9%   Values used to calculate the score:     Age: 45 years     Sex: Female     Is Non-Hispanic African American: No     Diabetic: No     Tobacco smoker: No     Systolic Blood Pressure: 076 mmHg     Is BP treated: No     HDL Cholesterol: 66.6 mg/dL     Total Cholesterol: 249 mg/dL    Assessment & Plan:   Problem List Items Addressed This Visit       Endocrine   Hypothyroidism - Primary   Relevant Medications   levothyroxine (SYNTHROID) 112 MCG tablet   Other Relevant Orders   TSH  (Completed)     Other   Mixed hyperlipidemia   Relevant Medications   ezetimibe (ZETIA) 10 MG tablet   Hypokalemia   Relevant Orders   Basic metabolic panel (Completed)   Elevated glucose   Relevant Orders   Basic metabolic panel (Completed)   Statin intolerance   Relevant Medications   ezetimibe (ZETIA) 10 MG tablet    Meds ordered this encounter  Medications   ezetimibe (ZETIA) 10 MG tablet    Sig: Take 1 tablet (10 mg total) by mouth daily.    Dispense:  90 tablet    Refill:  3   levothyroxine (SYNTHROID) 112 MCG tablet    Sig: Take 1 tablet (112 mcg total) by mouth daily before breakfast.    Dispense:  90 tablet    Refill:  3    Follow-up: Return in about 3 months (around 03/30/2021).  Intermediate risk of ASCVD disease in the next 10 years.  Shared decision making was an active and patient agrees to give Zetia a try.  Rechecking TSH after increasing levothyroxine.  Hemoglobin A1c has been in the normal range.  Libby Maw, MD

## 2020-12-31 ENCOUNTER — Ambulatory Visit: Payer: PPO | Admitting: Physical Therapy

## 2020-12-31 ENCOUNTER — Encounter: Payer: Self-pay | Admitting: Orthopaedic Surgery

## 2020-12-31 ENCOUNTER — Encounter: Payer: Self-pay | Admitting: Physical Therapy

## 2020-12-31 DIAGNOSIS — R262 Difficulty in walking, not elsewhere classified: Secondary | ICD-10-CM

## 2020-12-31 DIAGNOSIS — M25662 Stiffness of left knee, not elsewhere classified: Secondary | ICD-10-CM

## 2020-12-31 DIAGNOSIS — R600 Localized edema: Secondary | ICD-10-CM

## 2020-12-31 DIAGNOSIS — M25562 Pain in left knee: Secondary | ICD-10-CM

## 2020-12-31 MED ORDER — LEVOTHYROXINE SODIUM 112 MCG PO TABS: 112.0000 ug | ORAL_TABLET | Freq: Every day | ORAL | 3 refills | Status: DC

## 2020-12-31 NOTE — Therapy (Signed)
[facility] [place], [place], [address] Phone: [phone]   Fax:  [phone]  Physical Therapy Treatment  Patient Details  Name: Megan Valentine MRN: [id number] Date of Birth: 1950-10-06 Referring Provider (PT): Joni Fears, MD   Encounter Date: 12/31/2020   PT End of Session - 12/31/20 1422     Visit Number 9    Number of Visits 16    Date for PT Re-Evaluation 01/29/21    Authorization Type [employer]    Progress Note Due on Visit 10    PT Start Time 4010    PT Stop Time 1431    PT Time Calculation (min) 48 min    Activity Tolerance Patient limited by pain;Patient tolerated treatment well    Behavior During Therapy WFL for tasks assessed/performed             Past Medical History:  Diagnosis Date   Allergy    Arthritis    Asthma    Cataract    Chronic bronchitis ([place])    Chronic kidney disease    stage 3, kidney infections   Complication of anesthesia    hard to wake up   Diverticulosis    Environmental allergies    GERD (gastroesophageal reflux disease)    Hiatal hernia    History of chicken pox    Hyperlipidemia    Hypothyroidism    IBS (irritable bowel syndrome)    Migraines    Mitral valve prolapse    Per pt . per Dr.Henry  [name].  does not have MVP   Neuromuscular disorder ([place])    neuropathy feet   Pneumonia    Rectal prolapse     Past Surgical History:  Procedure Laterality Date   ABDOMINAL HYSTERECTOMY  1999   APPENDECTOMY  1962   BREAST CYST ASPIRATION Bilateral    CHOLECYSTECTOMY N/A 12/17/2018   Procedure: LAPAROSCOPIC CHOLECYSTECTOMY;  Surgeon: Clovis Riley, MD;  Location: [facility];  Service: General;  Laterality: N/A;   EYE SURGERY     cataract   JOINT REPLACEMENT     rt knee scope   REFRACTIVE SURGERY  2000   both eyes   retrocele repair     and bladder sling   TOTAL KNEE ARTHROPLASTY Right 03/24/2015   Procedure: TOTAL KNEE ARTHROPLASTY;  Surgeon: [name], MD;  Location: [facility];  Service: Orthopedics;  Laterality: Right;   TOTAL KNEE ARTHROPLASTY Left 11/17/2020   Procedure: LEFT TOTAL KNEE ARTHROPLASTY;  Surgeon: [name], MD;  Location: [facility];  Service: Orthopedics;  Laterality: Left;   TUBAL LIGATION  1977    There were no vitals filed for this visit.   Subjective Assessment - 12/31/20 1346     Subjective knee has been really sore for the past few days; has started driving now    Pertinent History arthritis, asthma, cataract, CKD, GERD, mitral valve prolapse, neuromusclular disorder feet, IBS, diverticulosis, hysterectomy 1999, joint replacement right knee 2017    Patient Stated Goals Walk without device,    Currently in Pain? Yes    Pain Score 2     Pain Location Knee    Pain Orientation Left    Pain Descriptors / Indicators Aching;Sore    Pain Type Acute pain    Pain Onset 1 to 4 weeks ago    Pain Frequency Intermittent    Aggravating Factors  riding in car, sitting with knee bent    Pain Relieving Factors meds, ice  [facility] PT Treatment/Exercise - 12/31/20 1349       Knee/Hip Exercises: Stretches   Passive Hamstring Stretch Left;30 seconds;3 reps    Passive Hamstring Stretch Limitations seated with strap, overpressure at distal thigh      Knee/Hip Exercises: Aerobic   Recumbent Bike L2 x 8 min      Knee/Hip Exercises: Machines for Strengthening   Total Gym Leg Press sled at 1, 75# double leg 3x10, left LE only 25# 3x10      Knee/Hip Exercises: Standing   Functional Squat 10 reps;3 seconds    Functional Squat Limitations TRX    Other Standing Knee Exercises RDL 10# 2x10      Vasopneumatic   Number Minutes Vasopneumatic  10 minutes    Vasopnuematic Location  Knee    Vasopneumatic Pressure Low    Vasopneumatic Temperature  34                       PT Short Term Goals - 12/28/20 1412       PT SHORT TERM GOAL #1   Title Pt  will be independent in her initial  HEP.    Status Achieved      PT SHORT TERM GOAL #2   Title Pt will improve her 5 time sit to stand to </= 14 seconds with no UE support.               PT Long Term Goals - 12/28/20 1613       PT LONG TERM GOAL #1   Title Pt will be indepedent in advanced HEP.    Status On-going      PT LONG TERM GOAL #2   Title Pt will improve her left knee AROM arc from 2-115 degrees.    Status On-going      PT LONG TERM GOAL #3   Title Pt will be able to navigate 1 flight of stairs step over step with single hand rail.    Status On-going      PT LONG TERM GOAL #4   Title Pt will be able to report pain </= 2/10 with ADL's and home chores.    Status On-going      PT LONG TERM GOAL #5   Title Pt will improve her FOTO to >/= 60%.    Baseline 61% on 12/28/2020    Status Achieved                   Plan - 12/31/20 1422     Clinical Impression Statement Pt tolerated session well today progressing standing and strengthening activities with good tolerance.  Overall progressing well with PT and will continue to benefit from PT to maximize function.    Comorbidities arthritis, asthma, cataract, CKD, GERD, mitral valve prolapse, neuromusclular disorder feet, IBS, diverticulosis, hysterectomy 1999, joint replacement right knee 2017    Examination-Activity Limitations Lift;Transfers;Other;Sit;Squat;Stand;Stairs;Dressing    Examination-Participation Restrictions Community Activity;Other;Yard Work;Driving    Stability/Clinical Decision Making Stable/Uncomplicated    Rehab Potential Good    PT Duration 8 weeks    PT Treatment/Interventions ADLs/Self Care Home Management;Cryotherapy;Electrical Stimulation;Ultrasound;Gait training;Stair training;Functional mobility training;Therapeutic activities;Therapeutic exercise;Balance training;Neuromuscular re-education;Patient/family education;Passive range of motion;Dry needling;Taping;Manual techniques;Vasopneumatic  Device    PT Next Visit Plan LE strenghtening including standing functional, manual therapy & exercises to increase knee ROM; extension; needs 10th visit progress note and check 5x STS    PT Home Exercise Plan Access Code: [email or web address]    Consulted and Agree  with Plan of Care Patient             Patient will benefit from skilled therapeutic intervention in order to improve the following deficits and impairments:  Decreased mobility, Decreased strength, Postural dysfunction, Decreased balance, Impaired flexibility, Pain, Decreased activity tolerance, Difficulty walking, Abnormal gait  Visit Diagnosis: Acute pain of left knee  Difficulty in walking, not elsewhere classified  Localized edema  Stiffness of left knee, not elsewhere classified     Problem List Patient Active Problem List   Diagnosis Date Noted   Macrocytosis 12/07/2020   S/P TKR (total knee replacement) using cement, left 11/17/2020   Chronic pain of right knee 09/30/2020   Medication side effect 09/30/2020   Statin intolerance 06/30/2020   Trigger point of shoulder region, left 02/25/2020   Elevated glucose 08/15/2019   Anemia 02/15/2019   Abdominal pain 12/14/2018   Sinus pressure 11/12/2018   History of 2019 novel coronavirus disease (COVID-19) 11/12/2018   Pleurisy 06/05/2018   Enterocele 06/05/2018   Rectocele 05/23/2018   Vaginal vault prolapse 05/23/2018   SUI (stress urinary incontinence, female) 05/23/2018   Asthma 05/07/2018   Stage 3a chronic kidney disease ([place]) 01/15/2018   Edema 12/15/2017   Insomnia 11/20/2017   Seborrheic dermatitis 11/16/2017   Seasonal allergic rhinitis due to pollen 11/16/2017   Menopausal and female climacteric states 10/19/2017   Constipation 03/26/2015   Unilateral primary osteoarthritis, left knee 11/17/2014   Healthcare maintenance 04/25/2014   Hypokalemia 09/15/2011   Hypothyroidism 08/21/2008   Mixed hyperlipidemia 08/21/2008        Laureen Abrahams,  PT, DPT 12/31/20 2:24 PM      [facility] [address] [place], [place], [address] Phone: [phone]   Fax:  [phone]  Name: MAFALDA MCGINNISS MRN: [id number] Date of Birth: 09-08-1950

## 2020-12-31 NOTE — Addendum Note (Signed)
Addended by: Abelino Derrick A on: 12/31/2020 10:04 AM   Modules accepted: Orders

## 2021-01-04 ENCOUNTER — Ambulatory Visit: Payer: PPO | Admitting: Physical Therapy

## 2021-01-04 ENCOUNTER — Other Ambulatory Visit: Payer: Self-pay

## 2021-01-04 ENCOUNTER — Encounter: Payer: Self-pay | Admitting: Physical Therapy

## 2021-01-04 ENCOUNTER — Telehealth: Payer: Self-pay | Admitting: *Deleted

## 2021-01-04 DIAGNOSIS — M25562 Pain in left knee: Secondary | ICD-10-CM

## 2021-01-04 DIAGNOSIS — R600 Localized edema: Secondary | ICD-10-CM

## 2021-01-04 DIAGNOSIS — M25662 Stiffness of left knee, not elsewhere classified: Secondary | ICD-10-CM | POA: Diagnosis not present

## 2021-01-04 DIAGNOSIS — R262 Difficulty in walking, not elsewhere classified: Secondary | ICD-10-CM

## 2021-01-04 NOTE — Telephone Encounter (Signed)
Call from patient stating the Gabapentin was helping her with nerve type pain. Now this pain has increased and seems to be coming from below knee up through knee (more on medial side of knee). Keeping her up at night and now extending into the day. She is on 100 mg of Gabapentin at night. Still taking Oxycodone sparingly along with muscle relaxer. She is 7 weeks post op. Any recommendations? Thanks.

## 2021-01-04 NOTE — Telephone Encounter (Signed)
Best to see her back in the office

## 2021-01-04 NOTE — Therapy (Signed)
[facility] [address] [place], [place], [address] Phone: [phone]   Fax:  [phone]  Physical Therapy Treatment/Progress Note Progress Note Reporting Period 12/01/20 to 01/04/21   See note below for Objective Data and Assessment of Progress/Goals.      Patient Details  Name: Megan Valentine MRN: [id number] Date of Birth: [birth year] Referring Provider (PT): Joni Fears, MD   Encounter Date: 01/04/2021   PT End of Session - 01/04/21 1507     Visit Number 10    Number of Visits 16    Date for PT Re-Evaluation 01/29/21    Authorization Type [employer]    Progress Note Due on Visit 20    PT Start Time 1430    PT Stop Time 1518    PT Time Calculation (min) 48 min    Activity Tolerance Patient limited by pain;Patient tolerated treatment well    Behavior During Therapy WFL for tasks assessed/performed             Past Medical History:  Diagnosis Date   Allergy    Arthritis    Asthma    Cataract    Chronic bronchitis ([place])    Chronic kidney disease    stage 3, kidney infections   Complication of anesthesia    hard to wake up   Diverticulosis    Environmental allergies    GERD (gastroesophageal reflux disease)    Hiatal hernia    History of chicken pox    Hyperlipidemia    Hypothyroidism    IBS (irritable bowel syndrome)    Migraines    Mitral valve prolapse    Per pt . per Dr.Henry  [name]  does not have MVP   Neuromuscular disorder ([place])    neuropathy feet   Pneumonia    Rectal prolapse     Past Surgical History:  Procedure Laterality Date   ABDOMINAL HYSTERECTOMY  1999   APPENDECTOMY  1962   BREAST CYST ASPIRATION Bilateral    CHOLECYSTECTOMY N/A 12/17/2018   Procedure: LAPAROSCOPIC CHOLECYSTECTOMY;  Surgeon: Clovis Riley, MD;  Location: [facility];  Service: General;  Laterality: N/A;   EYE SURGERY     cataract   JOINT REPLACEMENT     rt knee scope   REFRACTIVE SURGERY  2000   both  eyes   retrocele repair     and bladder sling   TOTAL KNEE ARTHROPLASTY Right 03/24/2015   Procedure: TOTAL KNEE ARTHROPLASTY;  Surgeon: [name], MD;  Location: [facility];  Service: Orthopedics;  Laterality: Right;   TOTAL KNEE ARTHROPLASTY Left 11/17/2020   Procedure: LEFT TOTAL KNEE ARTHROPLASTY;  Surgeon: [name], MD;  Location[facility];  Service: Orthopedics;  Laterality: Left;   TUBAL LIGATION  1977    There were no vitals filed for this visit.   Subjective Assessment - 01/04/21 1432     Subjective knee is burning more today; not having as great of a day    Pertinent History arthritis, asthma, cataract, CKD, GERD, mitral valve prolapse, neuromusclular disorder feet, IBS, diverticulosis, hysterectomy 1999, joint replacement right knee 2017    Patient Stated Goals Walk without device,    Currently in Pain? Yes    Pain Score 4     Pain Location Knee    Pain Orientation Left    Pain Descriptors / Indicators Aching;Sore;Burning    Pain Type Acute pain    Pain Onset 1 to 4 weeks ago    Pain Frequency Intermittent  Aggravating Factors  riding in care, constant burning    Pain Relieving Factors meds, ice                [facility] PT Assessment - 01/04/21 0001       Assessment   Medical Diagnosis M17.12    Referring Provider (PT) Joni Fears, MD      Observation/Other Assessments   Focus on Therapeutic Outcomes (FOTO)  61%      AROM   Left Knee Extension -6    Left Knee Flexion 110                           [facility] Adult PT Treatment/Exercise - 01/04/21 1434       Knee/Hip Exercises: Stretches   Passive Hamstring Stretch Left;30 seconds;3 reps    Passive Hamstring Stretch Limitations seated with overpressure at distal thigh      Knee/Hip Exercises: Aerobic   Recumbent Bike L4 x 8 min      Knee/Hip Exercises: Machines for Strengthening   Total Gym Leg Press sled at 1, 75# double leg 3x10, left LE only 25# 3x10      Knee/Hip  Exercises: Standing   Terminal Knee Extension Right;2 sets;10 reps      Knee/Hip Exercises: Supine   Short Arc Quad Sets Left;2 sets;10 reps    Short Arc Quad Sets Limitations 4#                       PT Short Term Goals - 12/28/20 1412       PT SHORT TERM GOAL #1   Title Pt will be independent in her initial  HEP.    Status Achieved      PT SHORT TERM GOAL #2   Title Pt will improve her 5 time sit to stand to </= 14 seconds with no UE support.               PT Long Term Goals - 01/04/21 1508       PT LONG TERM GOAL #1   Title Pt will be indepedent in advanced HEP.    Baseline 12/12: still developing    Status On-going    Target Date 01/29/21      PT LONG TERM GOAL #2   Title Pt will improve her left knee AROM arc from 2-115 degrees.    Baseline 12/12: see flowsheet    Status On-going    Target Date 01/30/20      PT LONG TERM GOAL #3   Title Pt will be able to navigate 1 flight of stairs step over step with single hand rail.    Baseline 12/ 12: step to leading with Rt    Status On-going    Target Date 01/30/20      PT LONG TERM GOAL #4   Title Pt will be able to report pain </= 2/10 with ADL's and home chores.    Status On-going    Target Date 01/30/20      PT LONG TERM GOAL #5   Title Pt will improve her FOTO to >/= 60%.    Baseline 61% on 12/28/2020    Status Achieved                   Plan - 01/04/21 1509     Clinical Impression Statement Pt tolerated session well today and is demonstrating progress towards her LTGs meeting FOTO score.  Will continue to benefit from PT to maximize function.    Comorbidities arthritis, asthma, cataract, CKD, GERD, mitral valve prolapse, neuromusclular disorder feet, IBS, diverticulosis, hysterectomy 1999, joint replacement right knee 2017    Examination-Activity Limitations Lift;Transfers;Other;Sit;Squat;Stand;Stairs;Dressing    Examination-Participation Restrictions Community Activity;Other;Yard  Work;Driving    Stability/Clinical Decision Making Stable/Uncomplicated    Rehab Potential Good    PT Duration 8 weeks    PT Treatment/Interventions ADLs/Self Care Home Management;Cryotherapy;Electrical Stimulation;Ultrasound;Gait training;Stair training;Functional mobility training;Therapeutic activities;Therapeutic exercise;Balance training;Neuromuscular re-education;Patient/family education;Passive range of motion;Dry needling;Taping;Manual techniques;Vasopneumatic Device    PT Next Visit Plan check 5x STS, continue with standing and quad activation, measure AROM    PT Home Exercise Plan Access Code: [email or web address]    Consulted and Agree with Plan of Care Patient             Patient will benefit from skilled therapeutic intervention in order to improve the following deficits and impairments:  Decreased mobility, Decreased strength, Postural dysfunction, Decreased balance, Impaired flexibility, Pain, Decreased activity tolerance, Difficulty walking, Abnormal gait  Visit Diagnosis: Acute pain of left knee  Difficulty in walking, not elsewhere classified  Localized edema  Stiffness of left knee, not elsewhere classified     Problem List Patient Active Problem List   Diagnosis Date Noted   Macrocytosis 12/07/2020   S/P TKR (total knee replacement) using cement, left 11/17/2020   Chronic pain of right knee 09/30/2020   Medication side effect 09/30/2020   Statin intolerance 06/30/2020   Trigger point of shoulder region, left 02/25/2020   Elevated glucose 08/15/2019   Anemia 02/15/2019   Abdominal pain 12/14/2018   Sinus pressure 11/12/2018   History of 2019 novel coronavirus disease (COVID-19) 11/12/2018   Pleurisy 06/05/2018   Enterocele 06/05/2018   Rectocele 05/23/2018   Vaginal vault prolapse 05/23/2018   SUI (stress urinary incontinence, female) 05/23/2018   Asthma 05/07/2018   Stage 3a chronic kidney disease ([place]) 01/15/2018   Edema 12/15/2017   Insomnia 11/20/2017    Seborrheic dermatitis 11/16/2017   Seasonal allergic rhinitis due to pollen 11/16/2017   Menopausal and female climacteric states 10/19/2017   Constipation 03/26/2015   Unilateral primary osteoarthritis, left knee 11/17/2014   Healthcare maintenance 04/25/2014   Hypokalemia 09/15/2011   Hypothyroidism 08/21/2008   Mixed hyperlipidemia 08/21/2008      Laureen Abrahams, PT, DPT 01/04/21 3:12 PM     [facility] [address] [place], [place], [address] Phone: [phone]   Fax:  [phone]  Name: JAZZLYN HUIZENGA MRN: [id number] Date of Birth: Feb 03, 1950

## 2021-01-05 ENCOUNTER — Ambulatory Visit (INDEPENDENT_AMBULATORY_CARE_PROVIDER_SITE_OTHER): Payer: PPO | Admitting: Orthopaedic Surgery

## 2021-01-05 ENCOUNTER — Encounter: Payer: Self-pay | Admitting: Physical Therapy

## 2021-01-05 ENCOUNTER — Encounter: Payer: Self-pay | Admitting: Orthopaedic Surgery

## 2021-01-05 ENCOUNTER — Ambulatory Visit: Payer: PPO | Admitting: Physical Therapy

## 2021-01-05 DIAGNOSIS — M1712 Unilateral primary osteoarthritis, left knee: Secondary | ICD-10-CM

## 2021-01-05 DIAGNOSIS — M25662 Stiffness of left knee, not elsewhere classified: Secondary | ICD-10-CM | POA: Diagnosis not present

## 2021-01-05 DIAGNOSIS — M25562 Pain in left knee: Secondary | ICD-10-CM | POA: Diagnosis not present

## 2021-01-05 DIAGNOSIS — R262 Difficulty in walking, not elsewhere classified: Secondary | ICD-10-CM | POA: Diagnosis not present

## 2021-01-05 DIAGNOSIS — R600 Localized edema: Secondary | ICD-10-CM | POA: Diagnosis not present

## 2021-01-05 DIAGNOSIS — Z96652 Presence of left artificial knee joint: Secondary | ICD-10-CM

## 2021-01-05 MED ORDER — GABAPENTIN 300 MG PO CAPS: ORAL_CAPSULE | ORAL | 0 refills | Status: DC

## 2021-01-05 NOTE — Therapy (Signed)
[facility] [address] [place], [place], [address] Phone: [phone]   Fax:  [phone]  Physical Therapy Treatment  Patient Details  Name: Megan Valentine MRN: [id number] Date of Birth: April 04, 1950 Referring Provider (PT): Joni Fears, MD   Encounter Date: 01/05/2021   PT End of Session - 01/05/21 1525     Visit Number 11    Number of Visits 16    Date for PT Re-Evaluation 01/29/21    Authorization Type [employer]    Progress Note Due on Visit 20    PT Start Time 1430    PT Stop Time 3557    PT Time Calculation (min) 45 min    Behavior During Therapy [facility] for tasks assessed/performed             Past Medical History:  Diagnosis Date   Allergy    Arthritis    Asthma    Cataract    Chronic bronchitis ([place])    Chronic kidney disease    stage 3, kidney infections   Complication of anesthesia    hard to wake up   Diverticulosis    Environmental allergies    GERD (gastroesophageal reflux disease)    Hiatal hernia    History of chicken pox    Hyperlipidemia    Hypothyroidism    IBS (irritable bowel syndrome)    Migraines    Mitral valve prolapse    Per pt . per Dr.Henry  [name]  does not have MVP   Neuromuscular disorder ([place])    neuropathy feet   Pneumonia    Rectal prolapse     Past Surgical History:  Procedure Laterality Date   ABDOMINAL HYSTERECTOMY  1999   APPENDECTOMY  1962   BREAST CYST ASPIRATION Bilateral    CHOLECYSTECTOMY N/A 12/17/2018   Procedure: LAPAROSCOPIC CHOLECYSTECTOMY;  Surgeon: Clovis Riley, MD;  Location: [place] [facility];  Service: General;  Laterality: N/A;   EYE SURGERY     cataract   JOINT REPLACEMENT     rt knee scope   REFRACTIVE SURGERY  2000   both eyes   retrocele repair     and bladder sling   TOTAL KNEE ARTHROPLASTY Right 03/24/2015   Procedure: TOTAL KNEE ARTHROPLASTY;  Surgeon: [name], MD;  Location: [facility];  Service: Orthopedics;  Laterality: Right;    TOTAL KNEE ARTHROPLASTY Left 11/17/2020   Procedure: LEFT TOTAL KNEE ARTHROPLASTY;  Surgeon: [name], MD;  [facility];  Service: Orthopedics;  Laterality: Left;   TUBAL LIGATION  1977    There were no vitals filed for this visit.   Subjective Assessment - 01/05/21 1524     Subjective Pt stating she is headed to see doctor right after her visit today.    Pertinent History arthritis, asthma, cataract, CKD, GERD, mitral valve prolapse, neuromusclular disorder feet, IBS, diverticulosis, hysterectomy 1999, joint replacement right knee 2017    Diagnostic tests X-ray    Currently in Pain? Yes    Pain Score 2     Pain Location Knee    Pain Orientation Left    Pain Descriptors / Indicators Aching;Sore;Tightness    Pain Type Surgical pain;Acute pain    Pain Onset More than a month ago                               [facility] Adult PT Treatment/Exercise - 01/05/21 0001  Knee/Hip Exercises: Stretches   Passive Hamstring Stretch Left;30 seconds;3 reps    Passive Hamstring Stretch Limitations seated with overpressure at distal thigh      Knee/Hip Exercises: Aerobic   Nustep L6 x 8 minutes      Knee/Hip Exercises: Machines for Strengthening   Cybex Knee Extension 5# 2x10 slow eccentrics    Total Gym Leg Press double leg 100# 2 x 10, Left LE only: 37# 2x10      Knee/Hip Exercises: Standing   Terminal Knee Extension Right;2 sets;10 reps      Vasopneumatic   Number Minutes Vasopneumatic  5 minutes    Vasopnuematic Location  Knee    Vasopneumatic Pressure Low    Vasopneumatic Temperature  34                       PT Short Term Goals - 12/28/20 1412       PT SHORT TERM GOAL #1   Title Pt will be independent in her initial  HEP.    Status Achieved      PT SHORT TERM GOAL #2   Title Pt will improve her 5 time sit to stand to </= 14 seconds with no UE support.               PT Long Term Goals - 01/05/21 1527       PT LONG  TERM GOAL #1   Title Pt will be indepedent in advanced HEP.    Status On-going      PT LONG TERM GOAL #2   Title Pt will improve her left knee AROM arc from 2-115 degrees.    Status On-going      PT LONG TERM GOAL #3   Title Pt will be able to navigate 1 flight of stairs step over step with single hand rail.    Status On-going      PT LONG TERM GOAL #4   Title Pt will be able to report pain </= 2/10 with ADL's and home chores.    Status On-going      PT LONG TERM GOAL #5   Title Pt will improve her FOTO to >/= 60%.    Status On-going                   Plan - 01/05/21 1526     Clinical Impression Statement Pt reporting 2/10 pain at rest. Pt is making progress with therapy with overall strength gains. Continue to progress with lskilled PT for increased eft quad strengh and ROM toward LTG's set.    Personal Factors and Comorbidities Comorbidity 3+    Comorbidities arthritis, asthma, cataract, CKD, GERD, mitral valve prolapse, neuromusclular disorder feet, IBS, diverticulosis, hysterectomy 1999, joint replacement right knee 2017    Examination-Activity Limitations Lift;Transfers;Other;Sit;Squat;Stand;Stairs;Dressing    Examination-Participation Restrictions Community Activity;Other;Yard Work;Driving    Stability/Clinical Decision Making Stable/Uncomplicated    Rehab Potential Good    PT Duration 8 weeks    PT Treatment/Interventions ADLs/Self Care Home Management;Cryotherapy;Electrical Stimulation;Ultrasound;Gait training;Stair training;Functional mobility training;Therapeutic activities;Therapeutic exercise;Balance training;Neuromuscular re-education;Patient/family education;Passive range of motion;Dry needling;Taping;Manual techniques;Vasopneumatic Device    PT Next Visit Plan check 5x STS, continue with standing and quad activation, measure AROM    PT Home Exercise Plan Access Code: [email or web address]    Consulted and Agree with Plan of Care Patient             Patient will  benefit from skilled therapeutic intervention in order to improve the  following deficits and impairments:  Decreased mobility, Decreased strength, Postural dysfunction, Decreased balance, Impaired flexibility, Pain, Decreased activity tolerance, Difficulty walking, Abnormal gait  Visit Diagnosis: Acute pain of left knee  Difficulty in walking, not elsewhere classified  Localized edema  Stiffness of left knee, not elsewhere classified     Problem List Patient Active Problem List   Diagnosis Date Noted   Macrocytosis 12/07/2020   S/P TKR (total knee replacement) using cement, left 11/17/2020   Chronic pain of right knee 09/30/2020   Medication side effect 09/30/2020   Statin intolerance 06/30/2020   Trigger point of shoulder region, left 02/25/2020   Elevated glucose 08/15/2019   Anemia 02/15/2019   Abdominal pain 12/14/2018   Sinus pressure 11/12/2018   History of 2019 novel coronavirus disease (COVID-19) 11/12/2018   Pleurisy 06/05/2018   Enterocele 06/05/2018   Rectocele 05/23/2018   Vaginal vault prolapse 05/23/2018   SUI (stress urinary incontinence, female) 05/23/2018   Asthma 05/07/2018   Stage 3a chronic kidney disease ([place]) 01/15/2018   Edema 12/15/2017   Insomnia 11/20/2017   Seborrheic dermatitis 11/16/2017   Seasonal allergic rhinitis due to pollen 11/16/2017   Menopausal and female climacteric states 10/19/2017   Constipation 03/26/2015   Unilateral primary osteoarthritis, left knee 11/17/2014   Healthcare maintenance 04/25/2014   Hypokalemia 09/15/2011   Hypothyroidism 08/21/2008   Mixed hyperlipidemia 08/21/2008    Oretha Caprice, PT, MPT 01/05/2021, 3:28 PM  [facility] [address] [place], [place], [address] Phone: [phone]   Fax:  [phone]  Name: TIFFANE SHELDON MRN: [id number] Date of Birth: 05-03-1950

## 2021-01-05 NOTE — Progress Notes (Signed)
Office Visit Note   Patient: Megan Valentine           Date of Birth: 1950-07-24           MRN: [id number] Visit Date: 01/05/2021              Requested by: Libby Maw, MD [address] [place],  [place] [address] PCP: Libby Maw, MD   Assessment & Plan: Visit Diagnoses:  1. Unilateral primary osteoarthritis, left knee   2. S/P TKR (total knee replacement) using cement, left     Plan: Megan Valentine is about 6 weeks status post primary left total knee replacement.  She is doing well in physical therapy and occasionally uses her cane.  She has been taking less and less of the oxycodone but having it problem with lots of burning around her knee particularly at night.  She has been using some ice and Robaxin but not sure that it is made much of a difference.  Recently we placed her on gabapentin 100 mg nightly but I think we need to increase that to 300 mg and see if that does not help.  Her knee exam actually was quite good and that she had just about full knee extension of flexed over 100 degrees without instability the incisions healing nicely.  There is still some induration but no evidence of infection.  She does have a history of neuropathy in both of her feet of unknown origin.  She is not diabetic.  I am not sure if this is part of that neuropathy or if is just related to the weakness of her thighs causing the burning particularly at night.  There seems to be some association between her being more active and having a bit more burning.  I do not think there is any evidence of infection or instability  Follow-Up Instructions: Return in about 1 month (around 02/05/2021).   Orders:  No orders of the defined types were placed in this encounter.  Meds ordered this encounter  Medications   gabapentin (NEURONTIN) 300 MG capsule    Sig: 1 capsule q HS    Dispense:  30 capsule    Refill:  0      Procedures: No procedures performed   Clinical Data: No  additional findings.   Subjective: Chief Complaint  Patient presents with   Left Knee - Follow-up    Left total knee arthroplasty 11/17/2020  Patient presents today for her left knee. She had a left knee arthroplasty on 11/17/2020. She has been going to PT twice weekly. She is experiencing a burning sensation medially and anteriorly. She has been taking Gabapentin and feels like it is not helping as much now. She is wakened with pain in her knee.  Denies any fever or chills.  Progressing nicely in physical therapy  HPI  Review of Systems   Objective: Vital Signs: There were no vitals taken for this visit.  Physical Exam  Ortho Exam left knee was no warmer than the right knee.  There did not appear to be an effusion.  Her incision is healing nicely.  She had just about full knee extension and flexed over 100 degrees without instability.  There were no localized areas of tenderness.  Has been experiencing some burning at night and I think it is related to the weakness of her thighs.  No obvious local tenderness or loss of sensation.  She does have a history of neuropathy in both of  her feet and has some numbness on a chronic basis  Specialty Comments:  No specialty comments available.  Imaging: No results found.   PMFS History: Patient Active Problem List   Diagnosis Date Noted   Macrocytosis 12/07/2020   S/P TKR (total knee replacement) using cement, left 11/17/2020   Chronic pain of right knee 09/30/2020   Medication side effect 09/30/2020   Statin intolerance 06/30/2020   Trigger point of shoulder region, left 02/25/2020   Elevated glucose 08/15/2019   Anemia 02/15/2019   Abdominal pain 12/14/2018   Sinus pressure 11/12/2018   History of 2019 novel coronavirus disease (COVID-19) 11/12/2018   Pleurisy 06/05/2018   Enterocele 06/05/2018   Rectocele 05/23/2018   Vaginal vault prolapse 05/23/2018   SUI (stress urinary incontinence, female) 05/23/2018   Asthma 05/07/2018    Stage 3a chronic kidney disease ([place]) 01/15/2018   Edema 12/15/2017   Insomnia 11/20/2017   Seborrheic dermatitis 11/16/2017   Seasonal allergic rhinitis due to pollen 11/16/2017   Menopausal and female climacteric states 10/19/2017   Constipation 03/26/2015   Unilateral primary osteoarthritis, left knee 11/17/2014   Healthcare maintenance 04/25/2014   Hypokalemia 09/15/2011   Hypothyroidism 08/21/2008   Mixed hyperlipidemia 08/21/2008   Past Medical History:  Diagnosis Date   Allergy    Arthritis    Asthma    Cataract    Chronic bronchitis ([place])    Chronic kidney disease    stage 3, kidney infections   Complication of anesthesia    hard to wake up   Diverticulosis    Environmental allergies    GERD (gastroesophageal reflux disease)    Hiatal hernia    History of chicken pox    Hyperlipidemia    Hypothyroidism    IBS (irritable bowel syndrome)    Migraines    Mitral valve prolapse    Per pt . per Dr.Henry  [name]  does not have MVP   Neuromuscular disorder ([place])    neuropathy feet   Pneumonia    Rectal prolapse     Family History  Problem Relation Age of Onset   Stomach cancer Maternal Grandmother    Hypertension Maternal Grandmother    Diabetes Maternal Grandmother    Stroke Maternal Grandmother    Cancer Maternal Grandmother        Stomach cancer   Colon cancer Paternal Grandfather    Heart attack Paternal Grandfather    Hyperlipidemia Mother    Diabetes Mother    Hypertension Mother    Alzheimer's disease Mother    Diabetes Father    Hyperlipidemia Father    Heart disease Father    Heart attack Father    Stroke Paternal Grandmother     Past Surgical History:  Procedure Laterality Date   ABDOMINAL HYSTERECTOMY  1999   APPENDECTOMY  1962   BREAST CYST ASPIRATION Bilateral    CHOLECYSTECTOMY N/A 12/17/2018   Procedure: LAPAROSCOPIC CHOLECYSTECTOMY;  Surgeon: Clovis Riley, MD;  [facility];  Service: General;  Laterality: N/A;   EYE  SURGERY     cataract   JOINT REPLACEMENT     rt knee scope   REFRACTIVE SURGERY  2000   both eyes   retrocele repair     and bladder sling   TOTAL KNEE ARTHROPLASTY Right 03/24/2015   Procedure: TOTAL KNEE ARTHROPLASTY;  Surgeon: [name], MD;  Location: [facility];  Service: Orthopedics;  Laterality: Right;   TOTAL KNEE ARTHROPLASTY Left 11/17/2020   Procedure: LEFT TOTAL KNEE ARTHROPLASTY;  Surgeon: [name], MD;  Location[facility];  Service: Orthopedics;  Laterality: Left;   TUBAL LIGATION  1977   Social History   Occupational History   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

## 2021-01-11 ENCOUNTER — Other Ambulatory Visit: Payer: Self-pay

## 2021-01-11 ENCOUNTER — Ambulatory Visit: Payer: PPO | Admitting: Physical Therapy

## 2021-01-11 ENCOUNTER — Encounter: Payer: Self-pay | Admitting: Physical Therapy

## 2021-01-11 DIAGNOSIS — M25662 Stiffness of left knee, not elsewhere classified: Secondary | ICD-10-CM

## 2021-01-11 DIAGNOSIS — R262 Difficulty in walking, not elsewhere classified: Secondary | ICD-10-CM | POA: Diagnosis not present

## 2021-01-11 DIAGNOSIS — R600 Localized edema: Secondary | ICD-10-CM | POA: Diagnosis not present

## 2021-01-11 DIAGNOSIS — M25562 Pain in left knee: Secondary | ICD-10-CM

## 2021-01-11 NOTE — Therapy (Signed)
[place], [place], [address] Phone: [phone]   Fax:  [phone]  Physical Therapy Treatment  Patient Details  Name: Megan Valentine MRN: [id number] Date of Birth: 15-Jun-1950 Referring Provider (PT): Joni Fears, MD   Encounter Date: 01/11/2021   PT End of Session - 01/11/21 1458     Visit Number 12    Progress Note Due on Visit 20    PT Start Time 8756    PT Stop Time 1513    PT Time Calculation (min) 40 min    Activity Tolerance Patient limited by pain;Patient tolerated treatment well    Behavior During Therapy WFL for tasks assessed/performed             Past Medical History:  Diagnosis Date   Allergy    Arthritis    Asthma    Cataract    Chronic bronchitis ([place])    Chronic kidney disease    stage 3, kidney infections   Complication of anesthesia    hard to wake up   Diverticulosis    Environmental allergies    GERD (gastroesophageal reflux disease)    Hiatal hernia    History of chicken pox    Hyperlipidemia    Hypothyroidism    IBS (irritable bowel syndrome)    Migraines    Mitral valve prolapse    Per pt . per Dr.Henry  [name].  does not have MVP   Neuromuscular disorder ([place])    neuropathy feet   Pneumonia    Rectal prolapse     Past Surgical History:  Procedure Laterality Date   ABDOMINAL HYSTERECTOMY  1999   APPENDECTOMY  1962   BREAST CYST ASPIRATION Bilateral    CHOLECYSTECTOMY N/A 12/17/2018   Procedure: LAPAROSCOPIC CHOLECYSTECTOMY;  Surgeon: Clovis Riley, MD;  Location: [facility];  Service: General;  Laterality: N/A;   EYE SURGERY     cataract   JOINT REPLACEMENT     rt knee scope   REFRACTIVE SURGERY  2000   both eyes   retrocele repair     and bladder sling   TOTAL KNEE ARTHROPLASTY Right 03/24/2015   Procedure: TOTAL KNEE ARTHROPLASTY;  Surgeon: [name], MD;  Location: [facility];  Service: Orthopedics;  Laterality: Right;   TOTAL KNEE ARTHROPLASTY Left  11/17/2020   Procedure: LEFT TOTAL KNEE ARTHROPLASTY;  Surgeon: [name], MD;  Location: [facility];  Service: Orthopedics;  Laterality: Left;   TUBAL LIGATION  1977    There were no vitals filed for this visit.   Subjective Assessment - 01/11/21 1440     Subjective Pt arriving today reporting 2/10 pain in her left medial  knee. Pt stating she may have slept on it with pressure placed on it by her right knee.    Diagnostic tests X-ray    Currently in Pain? Yes    Pain Score 2     Pain Location Knee    Pain Orientation Left    Pain Descriptors / Indicators Aching;Sore;Tightness    Pain Type Surgical pain    Pain Onset More than a month ago                [facility] PT Assessment - 01/11/21 0001       Assessment   Medical Diagnosis M17.12    Referring Provider (PT) Joni Fears, MD      AROM   Left Knee Extension -6    Left Knee Flexion 118  PROM   Left Knee Extension -4    Left Knee Flexion 122                           [facility] Adult PT Treatment/Exercise - 01/11/21 0001       Knee/Hip Exercises: Aerobic   Recumbent Bike L4 x 8 minutes    Nustep --      Knee/Hip Exercises: Machines for Strengthening   Cybex Knee Extension 5# 2x10 slow eccentrics    Total Gym Leg Press double leg 100# 2 x 10, Left LE only: 37# 2x10   attempted 43# pt unable to move the sled with single LE.     Knee/Hip Exercises: Standing   Terminal Knee Extension Right;2 sets;10 reps    Theraband Level (Terminal Knee Extension) Level 2 (Red)    Other Standing Knee Exercises up and down steps with handrail with supervision x 2                       PT Short Term Goals - 01/11/21 1516       PT SHORT TERM GOAL #1   Title Pt will be independent in her initial  HEP.    Status Achieved      PT SHORT TERM GOAL #2   Title Pt will improve her 5 time sit to stand to </= 14 seconds with no UE support.    Baseline 13 seocnds no UE support    Status Achieved                PT Long Term Goals - 01/11/21 1516       PT LONG TERM GOAL #1   Title Pt will be indepedent in advanced HEP.    Status On-going      PT LONG TERM GOAL #2   Title Pt will improve her left knee AROM arc from 2-115 degrees.    Baseline 118 active knee flexion, still progressing toward extension part of the goal    Period Weeks    Status Partially Met      PT LONG TERM GOAL #3   Title Pt will be able to navigate 1 flight of stairs step over step with single hand rail.    Baseline step over step with single handrail with increased reliance on handrail.    Status On-going      PT LONG TERM GOAL #4   Title Pt will be able to report pain </= 2/10 with ADL's and home chores.    Status On-going      PT LONG TERM GOAL #5   Title Pt will improve her FOTO to >/= 60%.    Status Achieved                   Plan - 01/11/21 1458     Clinical Impression Statement Pt arriving today reporting 2/10 pain in her left medial knee. Pt able to navigate up and down 1 flight of stairs with single handrail with increased weigth shfiting onto her right LE. Pt still relying on her UE's for increased pull when performing stair navigation. Continue skilled PT as pt progresses toward LTG's.    Personal Factors and Comorbidities Comorbidity 3+    Comorbidities arthritis, asthma, cataract, CKD, GERD, mitral valve prolapse, neuromusclular disorder feet, IBS, diverticulosis, hysterectomy 1999, joint replacement right knee 2017    Examination-Activity Limitations Lift;Transfers;Other;Sit;Squat;Stand;Stairs;Dressing    Examination-Participation Restrictions Community Activity;Other;Yard Work;Driving  Stability/Clinical Decision Making Stable/Uncomplicated    Rehab Potential Good    PT Treatment/Interventions ADLs/Self Care Home Management;Cryotherapy;Electrical Stimulation;Ultrasound;Gait training;Stair training;Functional mobility training;Therapeutic activities;Therapeutic  exercise;Balance training;Neuromuscular re-education;Patient/family education;Passive range of motion;Dry needling;Taping;Manual techniques;Vasopneumatic Device    PT Next Visit Plan continue with standing and quad activation,    PT Home Exercise Plan Access Code: [email or web address]    Consulted and Agree with Plan of Care Patient             Patient will benefit from skilled therapeutic intervention in order to improve the following deficits and impairments:     Visit Diagnosis: Acute pain of left knee  Difficulty in walking, not elsewhere classified  Localized edema  Stiffness of left knee, not elsewhere classified     Problem List Patient Active Problem List   Diagnosis Date Noted   Macrocytosis 12/07/2020   S/P TKR (total knee replacement) using cement, left 11/17/2020   Chronic pain of right knee 09/30/2020   Medication side effect 09/30/2020   Statin intolerance 06/30/2020   Trigger point of shoulder region, left 02/25/2020   Elevated glucose 08/15/2019   Anemia 02/15/2019   Abdominal pain 12/14/2018   Sinus pressure 11/12/2018   History of 2019 novel coronavirus disease (COVID-19) 11/12/2018   Pleurisy 06/05/2018   Enterocele 06/05/2018   Rectocele 05/23/2018   Vaginal vault prolapse 05/23/2018   SUI (stress urinary incontinence, female) 05/23/2018   Asthma 05/07/2018   Stage 3a chronic kidney disease ([place]) 01/15/2018   Edema 12/15/2017   Insomnia 11/20/2017   Seborrheic dermatitis 11/16/2017   Seasonal allergic rhinitis due to pollen 11/16/2017   Menopausal and female climacteric states 10/19/2017   Constipation 03/26/2015   Unilateral primary osteoarthritis, left knee 11/17/2014   Healthcare maintenance 04/25/2014   Hypokalemia 09/15/2011   Hypothyroidism 08/21/2008   Mixed hyperlipidemia 08/21/2008    Oretha Caprice, PT, MPT 01/11/2021, 3:17 PM  [facility] [address] [place], [place], [address] Phone:  [phone]   Fax:  [phone]  Name: CORRIN SIELING MRN: [id number] Date of Birth: [birth year]

## 2021-01-12 ENCOUNTER — Other Ambulatory Visit: Payer: Self-pay | Admitting: Physician Assistant

## 2021-01-12 ENCOUNTER — Telehealth: Payer: Self-pay | Admitting: *Deleted

## 2021-01-12 MED ORDER — GABAPENTIN 100 MG PO CAPS: 100.0000 mg | ORAL_CAPSULE | Freq: Every day | ORAL | 1 refills | Status: DC

## 2021-01-12 MED ORDER — TRAMADOL HCL 50 MG PO TABS: 50.0000 mg | ORAL_TABLET | Freq: Four times a day (QID) | ORAL | 0 refills | Status: DC | PRN

## 2021-01-12 MED ORDER — METHOCARBAMOL 500 MG PO TABS
500.0000 mg | ORAL_TABLET | Freq: Two times a day (BID) | ORAL | 1 refills | Status: DC | PRN
Start: 2021-01-12 — End: 2021-03-03

## 2021-01-12 NOTE — Telephone Encounter (Signed)
Patient called requesting refill of pain medication, muscle relaxer, and 100 mg Gabapentin if possible. She uses the 300 mg at night, but sometimes during the day she states the burning becomes worse and uses the left over 100 mg to help with this. Asked if she could have more of this for breakthrough burning pain. Thanks.

## 2021-01-12 NOTE — Telephone Encounter (Signed)
Renew robaxin, gabapentin 100mg  tabs and trramadol for pain-thanks

## 2021-01-12 NOTE — Telephone Encounter (Signed)
done

## 2021-01-13 ENCOUNTER — Ambulatory Visit: Payer: PPO | Admitting: Physical Therapy

## 2021-01-13 ENCOUNTER — Encounter: Payer: Self-pay | Admitting: Physical Therapy

## 2021-01-13 ENCOUNTER — Other Ambulatory Visit: Payer: Self-pay

## 2021-01-13 ENCOUNTER — Telehealth: Payer: Self-pay | Admitting: Family Medicine

## 2021-01-13 DIAGNOSIS — M25562 Pain in left knee: Secondary | ICD-10-CM

## 2021-01-13 DIAGNOSIS — M25662 Stiffness of left knee, not elsewhere classified: Secondary | ICD-10-CM

## 2021-01-13 DIAGNOSIS — R600 Localized edema: Secondary | ICD-10-CM

## 2021-01-13 DIAGNOSIS — R262 Difficulty in walking, not elsewhere classified: Secondary | ICD-10-CM | POA: Diagnosis not present

## 2021-01-13 NOTE — Therapy (Signed)
[place], [place], [address] Phone: [phone]   Fax:  [phone]  Physical Therapy Treatment  Patient Details  Name: Megan Valentine MRN: [id number] Date of Birth: [birth year] Referring Provider (PT): Joni Fears, MD   Encounter Date: 01/13/2021   PT End of Session - 01/13/21 1149     Visit Number 13    Number of Visits 16    Date for PT Re-Evaluation 01/29/21    Authorization Type [employer]    Progress Note Due on Visit 20    PT Start Time 1100    PT Stop Time 1140    PT Time Calculation (min) 40 min    Activity Tolerance Patient limited by pain;Patient tolerated treatment well    Behavior During Therapy WFL for tasks assessed/performed             Past Medical History:  Diagnosis Date   Allergy    Arthritis    Asthma    Cataract    Chronic bronchitis ([place])    Chronic kidney disease    stage 3, kidney infections   Complication of anesthesia    hard to wake up   Diverticulosis    Environmental allergies    GERD (gastroesophageal reflux disease)    Hiatal hernia    History of chicken pox    Hyperlipidemia    Hypothyroidism    IBS (irritable bowel syndrome)    Migraines    Mitral valve prolapse    Per pt . per Dr.Henry  [name].  does not have MVP   Neuromuscular disorder ([place])    neuropathy feet   Pneumonia    Rectal prolapse     Past Surgical History:  Procedure Laterality Date   ABDOMINAL HYSTERECTOMY  1999   APPENDECTOMY  1962   BREAST CYST ASPIRATION Bilateral    CHOLECYSTECTOMY N/A 12/17/2018   Procedure: LAPAROSCOPIC CHOLECYSTECTOMY;  Surgeon: Clovis Riley, MD;  Location: [facility];  Service: General;  Laterality: N/A;   EYE SURGERY     cataract   JOINT REPLACEMENT     rt knee scope   REFRACTIVE SURGERY  2000   both eyes   retrocele repair     and bladder sling   TOTAL KNEE ARTHROPLASTY Right 03/24/2015   Procedure: TOTAL KNEE ARTHROPLASTY;  Surgeon: [name], MD;  Location: [facility];  Service: Orthopedics;  Laterality: Right;   TOTAL KNEE ARTHROPLASTY Left 11/17/2020   Procedure: LEFT TOTAL KNEE ARTHROPLASTY;  Surgeon: [name], MD;  Location: [facility];  Service: Orthopedics;  Laterality: Left;   TUBAL LIGATION  1977    There were no vitals filed for this visit.   Subjective Assessment - 01/13/21 1148     Subjective Pt arriving today reporting it was the first day in "forever" that she wok up with no knee pain. Pt stating yesterday was a bad day with increased pain, but woke up this morning with none.    Pertinent History arthritis, asthma, cataract, CKD, GERD, mitral valve prolapse, neuromusclular disorder feet, IBS, diverticulosis, hysterectomy 1999, joint replacement right knee 2017    Patient Stated Goals Walk without device,    Currently in Pain? No/denies    Pain Score 0-No pain                [facility] PT Assessment - 01/13/21 0001       Assessment   Medical Diagnosis M17.12    Referring Provider (PT) Joni Fears, MD  AROM   Left Knee Extension -5    Left Knee Flexion 118      PROM   Left Knee Extension -4    Left Knee Flexion 122                           [facility] Adult PT Treatment/Exercise - 01/13/21 0001       Neuro Re-ed    Neuro Re-ed Details  Vectors with slide disc toward 3 cones, airex beam x 4 with single UE support in parallel bars, BOSU ball dome down x 2 minutes with intermittent UE support in parallel bars      Knee/Hip Exercises: Stretches   Active Hamstring Stretch Limitations 2 reps standing at TM bar holding 20 seconds    Gastroc Stretch 2 reps;30 seconds      Knee/Hip Exercises: Aerobic   Recumbent Bike L4 x 8 minutes      Knee/Hip Exercises: Machines for Strengthening   Cybex Knee Extension 5# 2x10 slow eccentrics    Total Gym Leg Press double leg 100# 3 x 10, Left LE only: 50# 1x10      Knee/Hip Exercises: Standing   Terminal Knee Extension Right;2  sets;10 reps    Theraband Level (Terminal Knee Extension) Level 2 (Red)    Other Standing Knee Exercises up and down 1 flight of stairs x 3 with single hand rail.      Knee/Hip Exercises: Seated   Long Arc Quad Strengthening;Left;15 reps;Weights    Long Arc Quad Weight 4 lbs.                       PT Short Term Goals - 01/11/21 1516       PT SHORT TERM GOAL #1   Title Pt will be independent in her initial  HEP.    Status Achieved      PT SHORT TERM GOAL #2   Title Pt will improve her 5 time sit to stand to </= 14 seconds with no UE support.    Baseline 13 seocnds no UE support    Status Achieved               PT Long Term Goals - 01/11/21 1516       PT LONG TERM GOAL #1   Title Pt will be indepedent in advanced HEP.    Status On-going      PT LONG TERM GOAL #2   Title Pt will improve her left knee AROM arc from 2-115 degrees.    Baseline 118 active knee flexion, still progressing toward extension part of the goal    Period Weeks    Status Partially Met      PT LONG TERM GOAL #3   Title Pt will be able to navigate 1 flight of stairs step over step with single hand rail.    Baseline step over step with single handrail with increased reliance on handrail.    Status On-going      PT LONG TERM GOAL #4   Title Pt will be able to report pain </= 2/10 with ADL's and home chores.    Status On-going      PT LONG TERM GOAL #5   Title Pt will improve her FOTO to >/= 60%.    Status Achieved                   Plan - 01/13/21 1149  Clinical Impression Statement Pt arriving today reporting no pain. Pt able to navigate one flight of stairs today with less reliance on her UE's. Pt still amb with mild left knee flexion. Pt instructed in extension exercises with over pressure. Pt with gradual improvements in overall AROM. Continue to progress with therapy toward more functional gait and community level activites with less pain.    Personal Factors and  Comorbidities Comorbidity 3+    Comorbidities arthritis, asthma, cataract, CKD, GERD, mitral valve prolapse, neuromusclular disorder feet, IBS, diverticulosis, hysterectomy 1999, joint replacement right knee 2017    Examination-Activity Limitations Lift;Transfers;Other;Sit;Squat;Stand;Stairs;Dressing    Examination-Participation Restrictions Community Activity;Other;Yard Work;Driving    Stability/Clinical Decision Making Stable/Uncomplicated    PT Frequency 2x / week    PT Duration 8 weeks    PT Treatment/Interventions ADLs/Self Care Home Management;Cryotherapy;Electrical Stimulation;Ultrasound;Gait training;Stair training;Functional mobility training;Therapeutic activities;Therapeutic exercise;Balance training;Neuromuscular re-education;Patient/family education;Passive range of motion;Dry needling;Taping;Manual techniques;Vasopneumatic Device    PT Next Visit Plan dynamic balance,  quad strengtheing, extension exercises    PT Home Exercise Plan Access Code: [email or web address]    Consulted and Agree with Plan of Care Patient             Patient will benefit from skilled therapeutic intervention in order to improve the following deficits and impairments:  Decreased mobility, Decreased strength, Postural dysfunction, Decreased balance, Impaired flexibility, Pain, Decreased activity tolerance, Difficulty walking, Abnormal gait  Visit Diagnosis: Acute pain of left knee  Difficulty in walking, not elsewhere classified  Localized edema  Stiffness of left knee, not elsewhere classified     Problem List Patient Active Problem List   Diagnosis Date Noted   Macrocytosis 12/07/2020   S/P TKR (total knee replacement) using cement, left 11/17/2020   Chronic pain of right knee 09/30/2020   Medication side effect 09/30/2020   Statin intolerance 06/30/2020   Trigger point of shoulder region, left 02/25/2020   Elevated glucose 08/15/2019   Anemia 02/15/2019   Abdominal pain 12/14/2018   Sinus  pressure 11/12/2018   History of 2019 novel coronavirus disease (COVID-19) 11/12/2018   Pleurisy 06/05/2018   Enterocele 06/05/2018   Rectocele 05/23/2018   Vaginal vault prolapse 05/23/2018   SUI (stress urinary incontinence, female) 05/23/2018   Asthma 05/07/2018   Stage 3a chronic kidney disease ([place]) 01/15/2018   Edema 12/15/2017   Insomnia 11/20/2017   Seborrheic dermatitis 11/16/2017   Seasonal allergic rhinitis due to pollen 11/16/2017   Menopausal and female climacteric states 10/19/2017   Constipation 03/26/2015   Unilateral primary osteoarthritis, left knee 11/17/2014   Healthcare maintenance 04/25/2014   Hypokalemia 09/15/2011   Hypothyroidism 08/21/2008   Mixed hyperlipidemia 08/21/2008    Oretha Caprice, PT, MPT 01/13/2021, 11:52 AM  [facility] [address] [place], [place], [address] Phone: [phone]   Fax:  [phone]  Name: TAYLER HEIDEN MRN: [id number] Date of Birth: 07-18-1950

## 2021-01-13 NOTE — Telephone Encounter (Signed)
Patient will need to be seen.  Can you please and thank you call her and get her an appointment.   Thanks.  Dm/cma

## 2021-01-14 ENCOUNTER — Ambulatory Visit (INDEPENDENT_AMBULATORY_CARE_PROVIDER_SITE_OTHER): Payer: PPO | Admitting: Nurse Practitioner

## 2021-01-14 ENCOUNTER — Ambulatory Visit: Payer: PPO | Admitting: Orthopaedic Surgery

## 2021-01-14 VITALS — BP 100/72 | HR 82 | Temp 97.2°F | Resp 14 | Ht 64.0 in | Wt 153.0 lb

## 2021-01-14 DIAGNOSIS — N3000 Acute cystitis without hematuria: Secondary | ICD-10-CM | POA: Diagnosis not present

## 2021-01-14 DIAGNOSIS — R3915 Urgency of urination: Secondary | ICD-10-CM | POA: Diagnosis not present

## 2021-01-14 LAB — POCT URINALYSIS DIPSTICK
Bilirubin, UA: NEGATIVE
Glucose, UA: NEGATIVE
Ketones, UA: NEGATIVE
Nitrite, UA: NEGATIVE
Protein, UA: POSITIVE — AB
Spec Grav, UA: 1.02 (ref 1.010–1.025)
Urobilinogen, UA: 0.2 E.U./dL
pH, UA: 5.5 (ref 5.0–8.0)

## 2021-01-14 MED ORDER — SULFAMETHOXAZOLE-TRIMETHOPRIM 800-160 MG PO TABS
1.0000 | ORAL_TABLET | Freq: Two times a day (BID) | ORAL | 0 refills | Status: AC
Start: 2021-01-14 — End: 2021-01-17

## 2021-01-14 NOTE — Assessment & Plan Note (Addendum)
Estimated Creatinine Clearance: 52.7 mL/min (by C-G formula based on SCr of 0.95 mg/dL).  Given packed patient's creatinine clearance and age we will avoid Macrobid currently.  We will put patient on Bactrim twice daily for 3 days for uncomplicated acute cystitis.  Pending culture

## 2021-01-14 NOTE — Progress Notes (Signed)
Acute Office Visit  Subjective:    Patient ID: Megan Valentine, female    DOB: Sep 19, 1950, 70 y.o.   MRN: [id number]  Chief Complaint  Patient presents with   Urinary Frequency    X 4 days, low back pain, some trouble urinating. No pain or burning with urination. Has been taking Azo for the past 2 days.    HPI Patient is in today for Urinary complaints  Symptoms have been for 4 days Hx of UTI. Knee replacement on 10-28. Normally on cranberry but has not started taking them again since her surgery. Azo night before last and yesterday morning has not taken any since Hx of bladder sling and vaginal reconstruction for a vaginal vault prolapse in 2020    Past Medical History:  Diagnosis Date   Allergy    Arthritis    Asthma    Cataract    Chronic bronchitis ([place])    Chronic kidney disease    stage 3, kidney infections   Complication of anesthesia    hard to wake up   Diverticulosis    Environmental allergies    GERD (gastroesophageal reflux disease)    Hiatal hernia    History of chicken pox    Hyperlipidemia    Hypothyroidism    IBS (irritable bowel syndrome)    Migraines    Mitral valve prolapse    Per pt . per Dr.Henry  [name]  does not have MVP   Neuromuscular disorder ([place])    neuropathy feet   Pneumonia    Rectal prolapse     Past Surgical History:  Procedure Laterality Date   ABDOMINAL HYSTERECTOMY  1999   APPENDECTOMY  1962   BREAST CYST ASPIRATION Bilateral    CHOLECYSTECTOMY N/A 12/17/2018   Procedure: LAPAROSCOPIC CHOLECYSTECTOMY;  Surgeon: Clovis Riley, MD;  Location: [facility];  Service: General;  Laterality: N/A;   EYE SURGERY     cataract   JOINT REPLACEMENT     rt knee scope   REFRACTIVE SURGERY  2000   both eyes   retrocele repair     and bladder sling   TOTAL KNEE ARTHROPLASTY Right 03/24/2015   Procedure: TOTAL KNEE ARTHROPLASTY;  Surgeon: [name], MD;  Location: [facility];  Service: Orthopedics;  Laterality: Right;   TOTAL  KNEE ARTHROPLASTY Left 11/17/2020   Procedure: LEFT TOTAL KNEE ARTHROPLASTY;  Surgeon: [name], MD;  Location: [facility];  Service: Orthopedics;  Laterality: Left;   TUBAL LIGATION  1977    Family History  Problem Relation Age of Onset   Stomach cancer Maternal Grandmother    Hypertension Maternal Grandmother    Diabetes Maternal Grandmother    Stroke Maternal Grandmother    Cancer Maternal Grandmother        Stomach cancer   Colon cancer Paternal Grandfather    Heart attack Paternal Grandfather    Hyperlipidemia Mother    Diabetes Mother    Hypertension Mother    Alzheimer's disease Mother    Diabetes Father    Hyperlipidemia Father    Heart disease Father    Heart attack Father    Stroke Paternal Grandmother     Social History   Socioeconomic History   Marital status: Widowed    Spouse name: Not on file   Number of children: Not on file   Years of education: 12   Highest education level: Not on file  Occupational History   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    Employer: [employer]  Tobacco Use   Smoking status: Former    Types: Cigarettes    Quit date: 03/23/1976    Years since quitting: 44.8   Smokeless tobacco: Never  Vaping Use   Vaping Use: Never used  Substance and Sexual Activity   Alcohol use: No   Drug use: No   Sexual activity: Yes    Birth control/protection: None, Post-menopausal  Other Topics Concern   Not on file  Social History Narrative   Caffeine Use-yes   Regular exercise-no   Right handed    Lives alone   Social Determinants of Health   Financial Resource Strain: Not on file  Food Insecurity: Not on file  Transportation Needs: Not on file  Physical Activity: Not on file  Stress: Not on file  Social Connections: Not on file  Intimate Partner Violence: Not on file    Outpatient Medications Prior to Visit  Medication Sig Dispense Refill   acetaminophen (TYLENOL) 500 MG tablet Take 1 tablet (500 mg total)  by mouth every 6 (six) hours. 30 tablet 0   aspirin 81 MG chewable tablet Chew 1 tablet (81 mg total) by mouth 2 (two) times daily.     estradiol (ESTRACE) 0.5 MG tablet TAKE 1 TABLET(0.5 MG) BY MOUTH DAILY 90 tablet 2   ezetimibe (ZETIA) 10 MG tablet Take 1 tablet (10 mg total) by mouth daily. 90 tablet 3   gabapentin (NEURONTIN) 100 MG capsule Take 1 capsule (100 mg total) by mouth at bedtime. 30 capsule 1   gabapentin (NEURONTIN) 300 MG capsule 1 capsule q HS 30 capsule 0   Ginkgo Biloba 120 MG CAPS Take 120 mg by mouth in the morning.     hydroxypropyl methylcellulose / hypromellose (ISOPTO TEARS / GONIOVISC) 2.5 % ophthalmic solution Place 1 drop into both eyes 4 (four) times daily as needed for dry eyes.     levothyroxine (SYNTHROID) 112 MCG tablet Take 1 tablet (112 mcg total) by mouth daily before breakfast. 90 tablet 3   loratadine (CLARITIN) 10 MG tablet Take 10 mg by mouth daily as needed for allergies.     methocarbamol (ROBAXIN) 500 MG tablet Take 1 tablet (500 mg total) by mouth every 12 (twelve) hours as needed for muscle spasms. 30 tablet 1   Multiple Vitamins-Minerals (HAIR/SKIN/NAILS/BIOTIN PO) Take 3 tablets by mouth in the morning.     potassium chloride SA (KLOR-CON) 20 MEQ tablet Take 1 tablet (20 mEq total) by mouth daily. (Patient not taking: Reported on 12/30/2020) 30 tablet 3   traMADol (ULTRAM) 50 MG tablet Take 1 tablet (50 mg total) by mouth every 6 (six) hours as needed for moderate pain. 30 tablet 0   ondansetron (ZOFRAN) 4 MG tablet Take 1 tablet (4 mg total) by mouth every 6 (six) hours as needed for nausea. (Patient not taking: Reported on 12/30/2020) 20 tablet 0   oxyCODONE (OXY IR/ROXICODONE) 5 MG immediate release tablet Take 1 tablet (5 mg total) by mouth every 4 (four) hours as needed for moderate pain (pain score 4-6). 30 tablet 0   oxyCODONE-acetaminophen (PERCOCET/ROXICET) 5-325 MG tablet Take 1 tablet by mouth every 8 (eight) hours as needed for severe pain.  30 tablet 0   No facility-administered medications prior to visit.    Allergies  Allergen Reactions   Tetracycline Hives and Itching   Dilaudid [Hydromorphone Hcl] Nausea And Vomiting   Other     Cigarette smoke and perfumes--flares asthma (develops bronchitis)  Review of Systems  Constitutional:  Negative for chills and fever.  Gastrointestinal:  Negative for abdominal pain, constipation, nausea and vomiting.  Genitourinary:  Positive for frequency and urgency. Negative for dysuria and hematuria.       No new nocturia Urinary hestiancy +   Musculoskeletal:  Positive for back pain.      Objective:    Physical Exam Vitals and nursing note reviewed. Exam conducted with a chaperone present [facility] [place], RMA).  Constitutional:      Appearance: Normal appearance.  Cardiovascular:     Rate and Rhythm: Normal rate and regular rhythm.  Pulmonary:     Effort: Pulmonary effort is normal.     Breath sounds: Normal breath sounds.  Abdominal:     General: Bowel sounds are normal. There is no distension.     Tenderness: There is no abdominal tenderness. There is no right CVA tenderness or left CVA tenderness.  Genitourinary:    Exam position: Lithotomy position.     Vagina: Normal.     Comments: No cystocele appreciated  Musculoskeletal:     Lumbar back: No swelling, edema, signs of trauma, tenderness or bony tenderness. Negative right straight leg raise test and negative left straight leg raise test.       Back:     Comments: Described as a pressure  Neurological:     Mental Status: She is alert.  Psychiatric:        Mood and Affect: Mood normal.        Behavior: Behavior normal.        Thought Content: Thought content normal.        Judgment: Judgment normal.    There were no vitals taken for this visit. Wt Readings from Last 3 Encounters:  12/30/20 152 lb 12.8 oz (69.3 kg)  12/07/20 155 lb 12.8 oz (70.7 kg)  11/17/20 153 lb (69.4 kg)    There are no  preventive care reminders to display for this patient.  There are no preventive care reminders to display for this patient.   Lab Results  Component Value Date   TSH 4.31 12/30/2020   Lab Results  Component Value Date   WBC 7.9 11/17/2020   HGB 13.5 11/17/2020   HCT 43.5 11/17/2020   MCV 104.6 (H) 11/17/2020   PLT 317 11/17/2020   Lab Results  Component Value Date   NA 141 12/30/2020   K 3.3 (L) 12/30/2020   CO2 26 12/30/2020   GLUCOSE 94 12/30/2020   BUN 16 12/30/2020   CREATININE 0.95 12/30/2020   BILITOT 0.4 09/30/2020   ALKPHOS 57 09/30/2020   AST 16 09/30/2020   ALT 15 09/30/2020   PROT 7.0 09/30/2020   ALBUMIN 4.1 09/30/2020   CALCIUM 9.3 12/30/2020   ANIONGAP 4 (L) 11/20/2020   GFR 60.58 12/30/2020   Lab Results  Component Value Date   CHOL 249 (H) 09/30/2020   Lab Results  Component Value Date   HDL 66.60 09/30/2020   Lab Results  Component Value Date   LDLCALC 155 (H) 09/30/2020   Lab Results  Component Value Date   TRIG 135.0 09/30/2020   Lab Results  Component Value Date   CHOLHDL 4 09/30/2020   Lab Results  Component Value Date   HGBA1C 5.6 12/07/2020       Assessment & Plan:   Problem List Items Addressed This Visit       Genitourinary   Acute cystitis without hematuria    Estimated Creatinine Clearance:  52.7 mL/min (by C-G formula based on SCr of 0.95 mg/dL).  Given packed patient's creatinine clearance and age we will avoid Macrobid currently.  We will put patient on Bactrim twice daily for 3 days for uncomplicated acute cystitis.  Pending culture      Relevant Medications   sulfamethoxazole-trimethoprim (BACTRIM DS) 800-160 MG tablet     Other   Urinary urgency - Primary   Relevant Orders   POCT urinalysis dipstick (Completed)   Urine Culture     No orders of the defined types were placed in this encounter.  This visit occurred during the SARS-CoV-2 public health emergency.  Safety protocols were in place, including  screening questions prior to the visit, additional usage of staff PPE, and extensive cleaning of exam room while observing appropriate contact time as indicated for disinfecting solutions.   Romilda Garret, NP

## 2021-01-14 NOTE — Patient Instructions (Signed)
Nice to see you today Sent medication into the pharmacy Follow up if symptoms fail to improve of get worse

## 2021-01-15 LAB — URINE CULTURE
MICRO NUMBER:: 12790901
Result:: NO GROWTH
SPECIMEN QUALITY:: ADEQUATE

## 2021-01-19 ENCOUNTER — Other Ambulatory Visit: Payer: Self-pay

## 2021-01-19 ENCOUNTER — Encounter: Payer: Self-pay | Admitting: Physical Therapy

## 2021-01-19 ENCOUNTER — Ambulatory Visit: Payer: PPO | Admitting: Physical Therapy

## 2021-01-19 DIAGNOSIS — M25662 Stiffness of left knee, not elsewhere classified: Secondary | ICD-10-CM | POA: Diagnosis not present

## 2021-01-19 DIAGNOSIS — R600 Localized edema: Secondary | ICD-10-CM | POA: Diagnosis not present

## 2021-01-19 DIAGNOSIS — R262 Difficulty in walking, not elsewhere classified: Secondary | ICD-10-CM

## 2021-01-19 DIAGNOSIS — M25562 Pain in left knee: Secondary | ICD-10-CM | POA: Diagnosis not present

## 2021-01-19 NOTE — Therapy (Signed)
[facility] [place], [place], [address] Phone: [phone]   Fax:  [phone]  Physical Therapy Treatment  Patient Details  Name: Megan Valentine MRN: [id number] Date of Birth: 1950/05/21 Referring Provider (PT): Joni Fears, MD   Encounter Date: 01/19/2021   PT End of Session - 01/19/21 1513     Visit Number 14    Number of Visits 16    Date for PT Re-Evaluation 01/29/21    Authorization Type [employer]    Progress Note Due on Visit 20    PT Start Time 1432    PT Stop Time 1512    PT Time Calculation (min) 40 min    Activity Tolerance Patient limited by pain;Patient tolerated treatment well    Behavior During Therapy WFL for tasks assessed/performed             Past Medical History:  Diagnosis Date   Allergy    Arthritis    Asthma    Cataract    Chronic bronchitis ([place])    Chronic kidney disease    stage 3, kidney infections   Complication of anesthesia    hard to wake up   Diverticulosis    Environmental allergies    GERD (gastroesophageal reflux disease)    Hiatal hernia    History of chicken pox    Hyperlipidemia    Hypothyroidism    IBS (irritable bowel syndrome)    Migraines    Mitral valve prolapse    Per pt . per Dr.Henry  [name].  does not have MVP   Neuromuscular disorder ([place])    neuropathy feet   Pneumonia    Rectal prolapse     Past Surgical History:  Procedure Laterality Date   ABDOMINAL HYSTERECTOMY  1999   APPENDECTOMY  1962   BREAST CYST ASPIRATION Bilateral    CHOLECYSTECTOMY N/A 12/17/2018   Procedure: LAPAROSCOPIC CHOLECYSTECTOMY;  Surgeon: Clovis Riley, MD;  Location: [facility];  Service: General;  Laterality: N/A;   EYE SURGERY     cataract   JOINT REPLACEMENT     rt knee scope   REFRACTIVE SURGERY  2000   both eyes   retrocele repair     and bladder sling   TOTAL KNEE ARTHROPLASTY Right 03/24/2015   Procedure: TOTAL KNEE ARTHROPLASTY;  Surgeon: [name], MD;  Location: [facility];  Service: Orthopedics;  Laterality: Right;   TOTAL KNEE ARTHROPLASTY Left 11/17/2020   Procedure: LEFT TOTAL KNEE ARTHROPLASTY;  Surgeon: [name], MD;  Location: [facility];  Service: Orthopedics;  Laterality: Left;   TUBAL LIGATION  1977    There were no vitals filed for this visit.   Subjective Assessment - 01/19/21 1505     Subjective Pt arriving today reporting 1/10 pain in her left knee. Pt still noticing stiffness when first getting up after prolonged sitting.    Pertinent History arthritis, asthma, cataract, CKD, GERD, mitral valve prolapse, neuromusclular disorder feet, IBS, diverticulosis, hysterectomy 1999, joint replacement right knee 2017    Diagnostic tests X-ray    Patient Stated Goals Walk without device,    Currently in Pain? Yes    Pain Score 1     Pain Location Knee    Pain Orientation Left    Pain Descriptors / Indicators Sore;Discomfort    Pain Type Surgical pain  [facility] Adult PT Treatment/Exercise - 01/19/21 0001       Neuro Re-ed    Neuro Re-ed Details  side stepping, diagnols using sliding disc,      Knee/Hip Exercises: Stretches   Active Hamstring Stretch Limitations seated with overpressure while pulling toes toward her nose 2x 30 seconds    Gastroc Stretch 2 reps;30 seconds      Knee/Hip Exercises: Aerobic   Recumbent Bike L4 x  71minutes      Knee/Hip Exercises: Machines for Strengthening   Cybex Knee Extension 5# attempted x 5, pt reporting increased pain across patella tendon on medial side    Cybex Knee Flexion 5# 2x10    Total Gym Leg Press double leg 100# 3 x 10, Left LE only: 50# 1x10      Knee/Hip Exercises: Standing   Terminal Knee Extension Right;2 sets;10 reps    Theraband Level (Terminal Knee Extension) Level 3 (Green)    Other Standing Knee Exercises TRX squats x 10      Knee/Hip Exercises: Seated   Long Arc Quad Strengthening;Left;15  reps;Weights    Long Arc Quad Weight 6 lbs.                       PT Short Term Goals - 01/11/21 1516       PT SHORT TERM GOAL #1   Title Pt will be independent in her initial  HEP.    Status Achieved      PT SHORT TERM GOAL #2   Title Pt will improve her 5 time sit to stand to </= 14 seconds with no UE support.    Baseline 13 seocnds no UE support    Status Achieved               PT Long Term Goals - 01/19/21 1516       PT LONG TERM GOAL #1   Title Pt will be indepedent in advanced HEP.    Status On-going      PT LONG TERM GOAL #2   Title Pt will improve her left knee AROM arc from 2-115 degrees.    Status On-going      PT LONG TERM GOAL #3   Title Pt will be able to navigate 1 flight of stairs step over step with single hand rail.    Status On-going      PT LONG TERM GOAL #4   Title Pt will be able to report pain </= 2/10 with ADL's and home chores.    Status Achieved      PT LONG TERM GOAL #5   Title Pt will improve her FOTO to >/= 60%.    Status Achieved                   Plan - 01/19/21 1514     Clinical Impression Statement Pt arriving reporting 1/10 pain in her left knee. Pt still reproting stiffness after sittting. Pt still progressing with left knee extension exercises. Difficulty with knee extension machine today with 5# weight. Pt was able to perform LAQ with 5# ankle weights. Continue strengthening and dynamic balance with SLS activities to maximize function.    Personal Factors and Comorbidities Comorbidity 3+    Comorbidities arthritis, asthma, cataract, CKD, GERD, mitral valve prolapse, neuromusclular disorder feet, IBS, diverticulosis, hysterectomy 1999, joint replacement right knee 2017    Examination-Participation Restrictions Community Activity;Other;Yard Work;Driving    Stability/Clinical Decision Making Stable/Uncomplicated    PT  Duration 8 weeks    PT Treatment/Interventions ADLs/Self Care Home  Management;Cryotherapy;Electrical Stimulation;Ultrasound;Gait training;Stair training;Functional mobility training;Therapeutic activities;Therapeutic exercise;Balance training;Neuromuscular re-education;Patient/family education;Passive range of motion;Dry needling;Taping;Manual techniques;Vasopneumatic Device    PT Next Visit Plan dynamic balance,  quad strengtheing, extension exercises    PT Home Exercise Plan Access Code: [email or web address]    Consulted and Agree with Plan of Care Patient             Patient will benefit from skilled therapeutic intervention in order to improve the following deficits and impairments:  Decreased mobility, Decreased strength, Postural dysfunction, Decreased balance, Impaired flexibility, Pain, Decreased activity tolerance, Difficulty walking, Abnormal gait  Visit Diagnosis: Acute pain of left knee  Difficulty in walking, not elsewhere classified  Localized edema  Stiffness of left knee, not elsewhere classified     Problem List Patient Active Problem List   Diagnosis Date Noted   Urinary urgency 01/14/2021   Acute cystitis without hematuria 01/14/2021   Macrocytosis 12/07/2020   S/P TKR (total knee replacement) using cement, left 11/17/2020   Chronic pain of right knee 09/30/2020   Medication side effect 09/30/2020   Statin intolerance 06/30/2020   Trigger point of shoulder region, left 02/25/2020   Elevated glucose 08/15/2019   Anemia 02/15/2019   Abdominal pain 12/14/2018   Sinus pressure 11/12/2018   History of 2019 novel coronavirus disease (COVID-19) 11/12/2018   Pleurisy 06/05/2018   Enterocele 06/05/2018   Rectocele 05/23/2018   Vaginal vault prolapse 05/23/2018   SUI (stress urinary incontinence, female) 05/23/2018   Asthma 05/07/2018   Stage 3a chronic kidney disease ([place]) 01/15/2018   Edema 12/15/2017   Insomnia 11/20/2017   Seborrheic dermatitis 11/16/2017   Seasonal allergic rhinitis due to pollen 11/16/2017   Menopausal and  female climacteric states 10/19/2017   Constipation 03/26/2015   Unilateral primary osteoarthritis, left knee 11/17/2014   Healthcare maintenance 04/25/2014   Hypokalemia 09/15/2011   Hypothyroidism 08/21/2008   Mixed hyperlipidemia 08/21/2008    Oretha Caprice, PT, MPT 01/19/2021, 3:20 PM  [facility] [address] [place], [place], [address] Phone: [phone]   Fax:  [phone]  Name: AHMARI GARTON MRN: [id number] Date of Birth: [birth year]

## 2021-01-19 NOTE — Telephone Encounter (Signed)
Patient was seen by Romilda Garret for this.  Dm/cma

## 2021-01-20 ENCOUNTER — Encounter: Payer: Self-pay | Admitting: Physical Therapy

## 2021-01-20 ENCOUNTER — Ambulatory Visit: Payer: PPO | Admitting: Physical Therapy

## 2021-01-20 DIAGNOSIS — R600 Localized edema: Secondary | ICD-10-CM | POA: Diagnosis not present

## 2021-01-20 DIAGNOSIS — M25662 Stiffness of left knee, not elsewhere classified: Secondary | ICD-10-CM | POA: Diagnosis not present

## 2021-01-20 DIAGNOSIS — R262 Difficulty in walking, not elsewhere classified: Secondary | ICD-10-CM

## 2021-01-20 DIAGNOSIS — M25562 Pain in left knee: Secondary | ICD-10-CM | POA: Diagnosis not present

## 2021-01-20 NOTE — Therapy (Addendum)
[place], [place], [address] Phone: [phone]   Fax:  [phone]  Physical Therapy Treatment / DIscharge  Patient Details  Name: Megan Valentine MRN: [id number] Date of Birth: July 05, 1950 Referring Provider (PT): Joni Fears, MD   Encounter Date: 01/20/2021   PT End of Session - 01/20/21 1109     Visit Number 15    Number of Visits 16    Date for PT Re-Evaluation 01/29/21    Authorization Type [employer]    Progress Note Due on Visit 20    PT Start Time 1100    PT Stop Time 1145    PT Time Calculation (min) 45 min    Activity Tolerance Patient limited by pain;Patient tolerated treatment well    Behavior During Therapy WFL for tasks assessed/performed             Past Medical History:  Diagnosis Date   Allergy    Arthritis    Asthma    Cataract    Chronic bronchitis ([place])    Chronic kidney disease    stage 3, kidney infections   Complication of anesthesia    hard to wake up   Diverticulosis    Environmental allergies    GERD (gastroesophageal reflux disease)    Hiatal hernia    History of chicken pox    Hyperlipidemia    Hypothyroidism    IBS (irritable bowel syndrome)    Migraines    Mitral valve prolapse    Per pt . per Dr.Henry  [name].  does not have MVP   Neuromuscular disorder ([place])    neuropathy feet   Pneumonia    Rectal prolapse     Past Surgical History:  Procedure Laterality Date   ABDOMINAL HYSTERECTOMY  1999   APPENDECTOMY  1962   BREAST CYST ASPIRATION Bilateral    CHOLECYSTECTOMY N/A 12/17/2018   Procedure: LAPAROSCOPIC CHOLECYSTECTOMY;  Surgeon: Clovis Riley, MD;  Location: [place];  Service: General;  Laterality: N/A;   EYE SURGERY     cataract   JOINT REPLACEMENT     rt knee scope   REFRACTIVE SURGERY  2000   both eyes   retrocele repair     and bladder sling   TOTAL KNEE ARTHROPLASTY Right 03/24/2015   Procedure: TOTAL KNEE ARTHROPLASTY;   Surgeon: [name], MD;  Location: [facility];  Service: Orthopedics;  Laterality: Right;   TOTAL KNEE ARTHROPLASTY Left 11/17/2020   Procedure: LEFT TOTAL KNEE ARTHROPLASTY;  Surgeon: [name], MD;  Location: [facility];  Service: Orthopedics;  Laterality: Left;   TUBAL LIGATION  1977    There were no vitals filed for this visit.   Subjective Assessment - 01/20/21 1108     Subjective Pt arriving today reporting 2/10 left knee pain.    Pertinent History arthritis, asthma, cataract, CKD, GERD, mitral valve prolapse, neuromusclular disorder feet, IBS, diverticulosis, hysterectomy 1999, joint replacement right knee 2017    Diagnostic tests X-ray    Patient Stated Goals Walk without device,    Currently in Pain? Yes    Pain Score 2     Pain Location Knee    Pain Orientation Left    Pain Descriptors / Indicators Aching;Tightness;Sore    Pain Type Surgical pain    Pain Onset More than a month ago                [facility] PT Assessment - 01/20/21 0001  Assessment   Medical Diagnosis M17.12    Referring Provider (PT) Joni Fears, MD      AROM   Left Knee Extension -4    Left Knee Flexion 122      PROM   Left Knee Extension -2    Left Knee Flexion 126      Strength   Strength Assessment Site Knee    Right/Left Knee Left    Left Knee Flexion 4/5    Left Knee Extension 4/5                           [facility] Adult PT Treatment/Exercise - 01/20/21 0001       Therapeutic Activites    Therapeutic Activities Other Therapeutic Activities    Other Therapeutic Activities up and down steps using single hand rail 1 flight x 4      Neuro Re-ed    Neuro Re-ed Details  airex beam x 4 walking foward and back with holding 10 seconds in middle of each trip      Knee/Hip Exercises: Stretches   Ambulance person reps;30 seconds    Gastroc Stretch 30 seconds;1 rep    Soleus Stretch 1 rep;30 seconds      Knee/Hip Exercises: Aerobic   Recumbent Bike L4  x  8 minutes      Knee/Hip Exercises: Machines for Strengthening   Total Gym Leg Press double leg 100# 3 x 10, Left LE only: 50# 2x10   pt was able to perform 2 sets today, unable to push 56# with left LE only     Knee/Hip Exercises: Standing   Other Standing Knee Exercises sqauts to 15 inch step height x 10 using UE support      Modalities   Modalities Cryotherapy      Cryotherapy   Number Minutes Cryotherapy 5 Minutes    Cryotherapy Location Knee    Type of Cryotherapy Ice pack      Manual Therapy   Manual therapy comments PROM left knee extension with overpressure, knee flexion                       PT Short Term Goals - 01/20/21 1119       PT SHORT TERM GOAL #1   Title Pt will be independent in her initial  HEP.    Status Achieved      PT SHORT TERM GOAL #2   Title Pt will improve her 5 time sit to stand to </= 14 seconds with no UE support.    Status Achieved               PT Long Term Goals - 01/20/21 1119       PT LONG TERM GOAL #1   Title Pt will be indepedent in advanced HEP.    Status On-going      PT LONG TERM GOAL #2   Title Pt will improve her left knee AROM arc from 2-115 degrees.    Baseline 118 active knee flexion, still progressing toward extension part of the goal    Status Partially Met      PT LONG TERM GOAL #3   Title Pt will be able to navigate 1 flight of stairs step over step with single hand rail.    Baseline step over step with single hand rail.    Status Achieved      PT LONG TERM GOAL #4  Title Pt will be able to report pain </= 2/10 with ADL's and home chores.    Status Achieved      PT LONG TERM GOAL #5   Title Pt will improve her FOTO to >/= 60%.    Status Achieved                   Plan - 01/20/21 1115     Clinical Impression Statement Pt arriving today reproting 2/10 pain in her left medial and anterior knee. Pt is making progress with overall range of motion. AROM arc on left knee is 4-122  degrees. Still focusing on strengtheing and knee extension. Considering discharge next visit.    Personal Factors and Comorbidities Comorbidity 3+    Comorbidities arthritis, asthma, cataract, CKD, GERD, mitral valve prolapse, neuromusclular disorder feet, IBS, diverticulosis, hysterectomy 1999, joint replacement right knee 2017    Examination-Participation Restrictions Community Activity;Other;Yard Work;Driving    Stability/Clinical Decision Making Stable/Uncomplicated    PT Duration 8 weeks    PT Treatment/Interventions ADLs/Self Care Home Management;Cryotherapy;Electrical Stimulation;Ultrasound;Gait training;Stair training;Functional mobility training;Therapeutic activities;Therapeutic exercise;Balance training;Neuromuscular re-education;Patient/family education;Passive range of motion;Dry needling;Taping;Manual techniques;Vasopneumatic Device    PT Next Visit Plan FOTO, consider discharge next visit. dynamic balance,  quad strengtheing, extension exercises    PT Home Exercise Plan Access Code: [email or web address]    Consulted and Agree with Plan of Care Patient             Patient will benefit from skilled therapeutic intervention in order to improve the following deficits and impairments:  Decreased mobility, Decreased strength, Postural dysfunction, Decreased balance, Impaired flexibility, Pain, Decreased activity tolerance, Difficulty walking, Abnormal gait  Visit Diagnosis: Acute pain of left knee  Difficulty in walking, not elsewhere classified  Localized edema  Stiffness of left knee, not elsewhere classified     Problem List Patient Active Problem List   Diagnosis Date Noted   Urinary urgency 01/14/2021   Acute cystitis without hematuria 01/14/2021   Macrocytosis 12/07/2020   S/P TKR (total knee replacement) using cement, left 11/17/2020   Chronic pain of right knee 09/30/2020   Medication side effect 09/30/2020   Statin intolerance 06/30/2020   Trigger point of shoulder  region, left 02/25/2020   Elevated glucose 08/15/2019   Anemia 02/15/2019   Abdominal pain 12/14/2018   Sinus pressure 11/12/2018   History of 2019 novel coronavirus disease (COVID-19) 11/12/2018   Pleurisy 06/05/2018   Enterocele 06/05/2018   Rectocele 05/23/2018   Vaginal vault prolapse 05/23/2018   SUI (stress urinary incontinence, female) 05/23/2018   Asthma 05/07/2018   Stage 3a chronic kidney disease ([place]) 01/15/2018   Edema 12/15/2017   Insomnia 11/20/2017   Seborrheic dermatitis 11/16/2017   Seasonal allergic rhinitis due to pollen 11/16/2017   Menopausal and female climacteric states 10/19/2017   Constipation 03/26/2015   Unilateral primary osteoarthritis, left knee 11/17/2014   Healthcare maintenance 04/25/2014   Hypokalemia 09/15/2011   Hypothyroidism 08/21/2008   Mixed hyperlipidemia 08/21/2008    Oretha Caprice, PT, MPT 01/20/2021, 12:03 PM   PHYSICAL THERAPY DISCHARGE SUMMARY  Visits from Start of Care: 15  Current functional level related to goals / functional outcomes: See note   Remaining deficits: See note   Education / Equipment: HEP   Patient agrees to discharge. Patient goals were partially met. Patient is being discharged due to not returning since the last visit.  Scot Jun, PT, DPT, OCS, ATC 03/08/21  3:25 PM     [facility]  [address] [place], [place], [address] Phone: [phone]   Fax:  [phone]  Name: AKIRE RENNERT MRN: [id number] Date of Birth: [birth year]

## 2021-01-22 ENCOUNTER — Other Ambulatory Visit: Payer: Self-pay

## 2021-01-22 ENCOUNTER — Ambulatory Visit
Admission: EM | Admit: 2021-01-22 | Discharge: 2021-01-22 | Disposition: A | Discharge status: Home / Self Care | Payer: PPO | Attending: Student | Admitting: Student

## 2021-01-22 DIAGNOSIS — N3000 Acute cystitis without hematuria: Secondary | ICD-10-CM

## 2021-01-22 LAB — POCT URINALYSIS DIP (MANUAL ENTRY)
Bilirubin, UA: NEGATIVE
Glucose, UA: NEGATIVE mg/dL
Ketones, POC UA: NEGATIVE mg/dL
Nitrite, UA: NEGATIVE
Protein Ur, POC: NEGATIVE mg/dL
Spec Grav, UA: 1.015 (ref 1.010–1.025)
Urobilinogen, UA: 0.2 E.U./dL
pH, UA: 5.5 (ref 5.0–8.0)

## 2021-01-22 MED ORDER — CEPHALEXIN 500 MG PO CAPS: 500.0000 mg | ORAL_CAPSULE | Freq: Four times a day (QID) | ORAL | 0 refills | Status: DC

## 2021-01-22 NOTE — Telephone Encounter (Signed)
I called pt and gave her the message. We are completely booked all through the [place], I suggested she go to [facility] and gave her the #. She was going to call.

## 2021-01-22 NOTE — ED Provider Notes (Signed)
[facility]    CSN: [id number] Arrival date & time: 01/22/21  1222      History   Chief Complaint Chief Complaint  Patient presents with   Recurrent UTI    HPI 12 B Megan Valentine is a 70 y.o. female presenting with persistent urinary symptoms.  History CKD stage 3. She was last evaluated on 01/14/2021 and was prescribed Bactrim x3 days, which improved but did not resolve her symptoms.  Urine culture came back as negative for bacteria.  Describes persistent urinary frequency and left-sided flank pain that is crampy in nature.  Denies dysuria, hematuria, abdominal pain, fever/chills, urinary incontinence.  Prior to this, last UTI was 1 year ago.  HPI  Past Medical History:  Diagnosis Date   Allergy    Arthritis    Asthma    Cataract    Chronic bronchitis ([place])    Chronic kidney disease    stage 3, kidney infections   Complication of anesthesia    hard to wake up   Diverticulosis    Environmental allergies    GERD (gastroesophageal reflux disease)    Hiatal hernia    History of chicken pox    Hyperlipidemia    Hypothyroidism    IBS (irritable bowel syndrome)    Migraines    Mitral valve prolapse    Per pt . per Dr.Henry  [name].  does not have MVP   Neuromuscular disorder ([place])    neuropathy feet   Pneumonia    Rectal prolapse     Patient Active Problem List   Diagnosis Date Noted   Urinary urgency 01/14/2021   Acute cystitis without hematuria 01/14/2021   Macrocytosis 12/07/2020   S/P TKR (total knee replacement) using cement, left 11/17/2020   Chronic pain of right knee 09/30/2020   Medication side effect 09/30/2020   Statin intolerance 06/30/2020   Trigger point of shoulder region, left 02/25/2020   Elevated glucose 08/15/2019   Anemia 02/15/2019   Abdominal pain 12/14/2018   Sinus pressure 11/12/2018   History of 2019 novel coronavirus disease (COVID-19) 11/12/2018   Pleurisy 06/05/2018   Enterocele 06/05/2018   Rectocele 05/23/2018    Vaginal vault prolapse 05/23/2018   SUI (stress urinary incontinence, female) 05/23/2018   Asthma 05/07/2018   Stage 3a chronic kidney disease ([place]) 01/15/2018   Edema 12/15/2017   Insomnia 11/20/2017   Seborrheic dermatitis 11/16/2017   Seasonal allergic rhinitis due to pollen 11/16/2017   Menopausal and female climacteric states 10/19/2017   Constipation 03/26/2015   Unilateral primary osteoarthritis, left knee 11/17/2014   Healthcare maintenance 04/25/2014   Hypokalemia 09/15/2011   Hypothyroidism 08/21/2008   Mixed hyperlipidemia 08/21/2008    Past Surgical History:  Procedure Laterality Date   [place] Bilateral    CHOLECYSTECTOMY N/A 12/17/2018   Procedure: LAPAROSCOPIC CHOLECYSTECTOMY;  Surgeon: Clovis Riley, MD;  Location: [place];  Service: General;  Laterality: N/A;   EYE SURGERY     cataract   JOINT REPLACEMENT     rt knee scope   REFRACTIVE SURGERY  2000   both eyes   retrocele repair     and bladder sling   TOTAL KNEE ARTHROPLASTY Right 03/24/2015   Procedure: TOTAL KNEE ARTHROPLASTY;  Surgeon: [name], MD;  Location: [facility];  Service: Orthopedics;  Laterality: Right;   TOTAL KNEE ARTHROPLASTY Left 11/17/2020   Procedure: LEFT TOTAL KNEE ARTHROPLASTY;  Surgeon: [name], MD;  Location: [facility];  Service: Orthopedics;  Laterality: Left;   TUBAL LIGATION  1977    OB History   No obstetric history on file.      Home Medications    Prior to Admission medications   Medication Sig Start Date End Date Taking? Authorizing Provider  cephALEXin (KEFLEX) 500 MG capsule Take 1 capsule (500 mg total) by mouth 4 (four) times daily. 01/22/21  Yes Hazel Sams, PA-C  acetaminophen (TYLENOL) 500 MG tablet Take 1 tablet (500 mg total) by mouth every 6 (six) hours. 11/19/20   Cherylann Ratel, PA-C  aspirin 81 MG chewable tablet Chew 1 tablet (81 mg total) by mouth 2 (two) times  daily. 11/19/20   Cherylann Ratel, PA-C  estradiol (ESTRACE) 0.5 MG tablet TAKE 1 TABLET(0.5 MG) BY MOUTH DAILY 03/25/20   Libby Maw, MD  ezetimibe (ZETIA) 10 MG tablet Take 1 tablet (10 mg total) by mouth daily. 12/30/20   Libby Maw, MD  gabapentin (NEURONTIN) 100 MG capsule Take 1 capsule (100 mg total) by mouth at bedtime. 01/12/21   Persons, Bevely Palmer, PA  gabapentin (NEURONTIN) 300 MG capsule 1 capsule q HS 01/05/21   [name], MD  Ginkgo Biloba 120 MG CAPS Take 120 mg by mouth in the morning.    [provider]  hydroxypropyl methylcellulose / hypromellose (ISOPTO TEARS / GONIOVISC) 2.5 % ophthalmic solution Place 1 drop into both eyes 4 (four) times daily as needed for dry eyes.    [provider]  levothyroxine (SYNTHROID) 112 MCG tablet Take 1 tablet (112 mcg total) by mouth daily before breakfast. 12/31/20   Libby Maw, MD  loratadine (CLARITIN) 10 MG tablet Take 10 mg by mouth daily as needed for allergies.    [provider]  methocarbamol (ROBAXIN) 500 MG tablet Take 1 tablet (500 mg total) by mouth every 12 (twelve) hours as needed for muscle spasms. 01/12/21   Persons, Bevely Palmer, PA  Multiple Vitamins-Minerals (HAIR/SKIN/NAILS/BIOTIN PO) Take 3 tablets by mouth in the morning.    [provider]  ondansetron (ZOFRAN) 4 MG tablet Take 4 mg by mouth every 6 (six) hours as needed for nausea or vomiting.    [provider]  potassium chloride SA (KLOR-CON) 20 MEQ tablet Take 1 tablet (20 mEq total) by mouth daily. 12/07/20   Libby Maw, MD  traMADol (ULTRAM) 50 MG tablet Take 1 tablet (50 mg total) by mouth every 6 (six) hours as needed for moderate pain. 01/12/21   Persons, Bevely Palmer, PA    Family History Family History  Problem Relation Age of Onset   Stomach cancer Maternal Grandmother    Hypertension Maternal Grandmother    Diabetes Maternal Grandmother    Stroke Maternal  Grandmother    Cancer Maternal Grandmother        Stomach cancer   Colon cancer Paternal Grandfather    Heart attack Paternal Grandfather    Hyperlipidemia Mother    Diabetes Mother    Hypertension Mother    Alzheimer's disease Mother    Diabetes Father    Hyperlipidemia Father    Heart disease Father    Heart attack Father    Stroke Paternal Grandmother     Social History Social History   Tobacco Use   Smoking status: Former    Types: Cigarettes    Quit date: 03/23/1976    Years since quitting: 44.8   Smokeless tobacco: Never  Vaping Use  Vaping Use: Never used  Substance Use Topics   Alcohol use: No   Drug use: No     Allergies   Tetracycline, Dilaudid [hydromorphone hcl], and Other   Review of Systems Review of Systems  Constitutional:  Negative for appetite change, chills, diaphoresis and fever.  Respiratory:  Negative for shortness of breath.   Cardiovascular:  Negative for chest pain.  Gastrointestinal:  Negative for abdominal pain, blood in stool, constipation, diarrhea, nausea and vomiting.  Genitourinary:  Positive for flank pain and frequency. Negative for decreased urine volume, difficulty urinating, dysuria, genital sores, hematuria and urgency.  Musculoskeletal:  Negative for back pain.  Neurological:  Negative for dizziness, weakness and light-headedness.  All other systems reviewed and are negative.   Physical Exam Triage Vital Signs ED Triage Vitals [01/22/21 1402]  Enc Vitals Group     BP 135/85     Pulse Rate 88     Resp 18     Temp 98 F (36.7 C)     Temp Source Oral     SpO2 98 %     Weight      Height      Head Circumference      Peak Flow      Pain Score 0     Pain Loc      Pain Edu?      Excl. in [place]?    No data found.  Updated Vital Signs BP 135/85 (BP Location: Left Arm)    Pulse 88    Temp 98 F (36.7 C) (Oral)    Resp 18    SpO2 98%   Visual Acuity Right Eye Distance:   Left Eye Distance:   Bilateral Distance:     Right Eye Near:   Left Eye Near:    Bilateral Near:     Physical Exam Vitals reviewed.  Constitutional:      General: She is not in acute distress.    Appearance: Normal appearance. She is not ill-appearing.  HENT:     Head: Normocephalic and atraumatic.     Mouth/Throat:     Mouth: Mucous membranes are moist.     Comments: Moist mucous membranes Eyes:     Extraocular Movements: Extraocular movements intact.     Pupils: Pupils are equal, round, and reactive to light.  Cardiovascular:     Rate and Rhythm: Normal rate and regular rhythm.     Heart sounds: Normal heart sounds.  Pulmonary:     Effort: Pulmonary effort is normal.     Breath sounds: Normal breath sounds. No wheezing, rhonchi or rales.  Abdominal:     General: Bowel sounds are normal. There is no distension.     Palpations: Abdomen is soft. There is no mass.     Tenderness: There is no abdominal tenderness. There is no right CVA tenderness, left CVA tenderness, guarding or rebound.  Skin:    General: Skin is warm.     Capillary Refill: Capillary refill takes less than 2 seconds.     Comments: Good skin turgor  Neurological:     General: No focal deficit present.     Mental Status: She is alert and oriented to person, place, and time.  Psychiatric:        Mood and Affect: Mood normal.        Behavior: Behavior normal.     UC Treatments / Results  Labs (all labs ordered are listed, but only abnormal results are displayed) Labs Reviewed  POCT URINALYSIS DIP (MANUAL ENTRY) - Abnormal; Notable for the following components:      Result Value   Blood, UA trace-intact (*)    Leukocytes, UA Trace (*)    All other components within normal limits    EKG   Radiology No results found.  Procedures Procedures (including critical care time)  Medications Ordered in UC Medications - No data to display  Initial Impression / Assessment and Plan / UC Course  I have reviewed the triage vital signs and the  nursing notes.  Pertinent labs & imaging results that were available during my care of the patient were reviewed by me and considered in my medical decision making (see chart for details).     This patient is a very pleasant 70 y.o. year old female presenting with acute cystitis. Afebrile, nontachycardic, no reproducible abd pain or CVAT.  Treated 01/14/21 with bactrim x3 days, culture was negative for bacteria. UA today with trace blood and trace leuk, culture sent. Will manage with Keflex given concurrent CKD. Good hydration.  F/u with [place] or PCP if symptoms persist..   Final Clinical Impressions(s) / UC Diagnoses   Final diagnoses:  Acute cystitis without hematuria     Discharge Instructions      -Start the antibiotic: Keflex, 4x daily x5 days. You can take this with food if you have a sensitive stomach. -Drink plenty of fluids -We'll call you if we need to change anything based on your urine culture, this typically takes 2-3 days   ED Prescriptions     Medication Sig Dispense Auth. Provider   cephALEXin (KEFLEX) 500 MG capsule Take 1 capsule (500 mg total) by mouth 4 (four) times daily. 20 capsule Hazel Sams, PA-C      PDMP not reviewed this encounter.   Hazel Sams, PA-C 01/22/21 1452

## 2021-01-22 NOTE — Telephone Encounter (Signed)
Agreed she needs to be seen. She may infact have a yeast infection. I do not see that she has been so far?

## 2021-01-22 NOTE — ED Triage Notes (Signed)
Pt states recently dx w/ UTI. Completed abx ut continues to have sx. Frequency, lower back pain.

## 2021-01-22 NOTE — Telephone Encounter (Signed)
Pt is complaining of same symptoms she had recently for her uti and is wanting a refill of the sulfamethoxazole-trimethoprim (BACTRIM DS) 800-160 MG tablet [[id number]. [id number] [address], [place]  [place], [place] [address]  Phone:  [phone]  Fax:  [phone]  DEA #:  [id number]  Please advise pt

## 2021-01-22 NOTE — Telephone Encounter (Signed)
She is currently at [facility]

## 2021-01-22 NOTE — Telephone Encounter (Signed)
She is having lower back pain and an itchy feeling in her vagina area.

## 2021-01-22 NOTE — Discharge Instructions (Addendum)
-  Start the antibiotic: Keflex, 4x daily x5 days. You can take this with food if you have a sensitive stomach. -Drink plenty of fluids -We'll call you if we need to change anything based on your urine culture, this typically takes 2-3 days

## 2021-01-24 LAB — URINE CULTURE

## 2021-01-26 ENCOUNTER — Encounter: Payer: PPO | Admitting: Physical Therapy

## 2021-01-26 NOTE — Telephone Encounter (Signed)
Returned patients call regarding call for an appointment to be seen for possible UTI, no answer LMTCB also sent message checking on patient via [place].

## 2021-01-27 ENCOUNTER — Ambulatory Visit (INDEPENDENT_AMBULATORY_CARE_PROVIDER_SITE_OTHER): Payer: PPO

## 2021-01-27 DIAGNOSIS — Z0000 Encounter for general adult medical examination without abnormal findings: Secondary | ICD-10-CM

## 2021-01-27 NOTE — Patient Instructions (Signed)
[name] , Thank you for taking time to come for your Medicare Wellness Visit. I appreciate your ongoing commitment to your health goals. Please review the following plan we discussed and let me know if I can assist you in the future.   Screening recommendations/referrals: Colonoscopy: 04/06/2011  due 03/2021 Mammogram: 09/22/2020 Bone Density: 11/05/2019 Recommended yearly ophthalmology/optometry visit for glaucoma screening and checkup Recommended yearly dental visit for hygiene and checkup  Vaccinations: Influenza vaccine: completed  Pneumococcal vaccine: completed  Tdap vaccine: 03/24/2013 Shingles vaccine: completed     Advanced directives: yes   Conditions/risks identified: none   Next appointment: none    Preventive Care 19 Years and Older, Female Preventive care refers to lifestyle choices and visits with your health care provider that can promote health and wellness. What does preventive care include? A yearly physical exam. This is also called an annual well check. Dental exams once or twice a year. Routine eye exams. Ask your health care provider how often you should have your eyes checked. Personal lifestyle choices, including: Daily care of your teeth and gums. Regular physical activity. Eating a healthy diet. Avoiding tobacco and drug use. Limiting alcohol use. Practicing safe sex. Taking low-dose aspirin every day. Taking vitamin and mineral supplements as recommended by your health care provider. What happens during an annual well check? The services and screenings done by your health care provider during your annual well check will depend on your age, overall health, lifestyle risk factors, and family history of disease. Counseling  Your health care provider may ask you questions about your: Alcohol use. Tobacco use. Drug use. Emotional well-being. Home and relationship well-being. Sexual activity. Eating habits. History of falls. Memory and  ability to understand (cognition). Work and work Statistician. Reproductive health. Screening  You may have the following tests or measurements: Height, weight, and BMI. Blood pressure. Lipid and cholesterol levels. These may be checked every 5 years, or more frequently if you are over 32 years old. Skin check. Lung cancer screening. You may have this screening every year starting at age 24 if you have a 30-pack-year history of smoking and currently smoke or have quit within the past 15 years. Fecal occult blood test (FOBT) of the stool. You may have this test every year starting at age 30. Flexible sigmoidoscopy or colonoscopy. You may have a sigmoidoscopy every 5 years or a colonoscopy every 10 years starting at age 38. Hepatitis C blood test. Hepatitis B blood test. Sexually transmitted disease (STD) testing. Diabetes screening. This is done by checking your blood sugar (glucose) after you have not eaten for a while (fasting). You may have this done every 1-3 years. Bone density scan. This is done to screen for osteoporosis. You may have this done starting at age 25. Mammogram. This may be done every 1-2 years. Talk to your health care provider about how often you should have regular mammograms. Talk with your health care provider about your test results, treatment options, and if necessary, the need for more tests. Vaccines  Your health care provider may recommend certain vaccines, such as: Influenza vaccine. This is recommended every year. Tetanus, diphtheria, and acellular pertussis (Tdap, Td) vaccine. You may need a Td booster every 10 years. Zoster vaccine. You may need this after age 75. Pneumococcal 13-valent conjugate (PCV13) vaccine. One dose is recommended after age 3. Pneumococcal polysaccharide (PPSV23) vaccine. One dose is recommended after age 53. Talk to your health care provider about which screenings and vaccines you need and  how often you need them. This information is  not intended to replace advice given to you by your health care provider. Make sure you discuss any questions you have with your health care provider. Document Released: 02/06/2015 Document Revised: 09/30/2015 Document Reviewed: 11/11/2014 Elsevier Interactive Patient Education  2017 [place] Prevention in the Home Falls can cause injuries. They can happen to people of all ages. There are many things you can do to make your home safe and to help prevent falls. What can I do on the outside of my home? Regularly fix the edges of walkways and driveways and fix any cracks. Remove anything that might make you trip as you walk through a door, such as a raised step or threshold. Trim any bushes or trees on the path to your home. Use bright outdoor lighting. Clear any walking paths of anything that might make someone trip, such as rocks or tools. Regularly check to see if handrails are loose or broken. Make sure that both sides of any steps have handrails. Any raised decks and porches should have guardrails on the edges. Have any leaves, snow, or ice cleared regularly. Use sand or salt on walking paths during winter. Clean up any spills in your garage right away. This includes oil or grease spills. What can I do in the bathroom? Use night lights. Install grab bars by the toilet and in the tub and shower. Do not use towel bars as grab bars. Use non-skid mats or decals in the tub or shower. If you need to sit down in the shower, use a plastic, non-slip stool. Keep the floor dry. Clean up any water that spills on the floor as soon as it happens. Remove soap buildup in the tub or shower regularly. Attach bath mats securely with double-sided non-slip rug tape. Do not have throw rugs and other things on the floor that can make you trip. What can I do in the bedroom? Use night lights. Make sure that you have a light by your bed that is easy to reach. Do not use any sheets or blankets that  are too big for your bed. They should not hang down onto the floor. Have a firm chair that has side arms. You can use this for support while you get dressed. Do not have throw rugs and other things on the floor that can make you trip. What can I do in the kitchen? Clean up any spills right away. Avoid walking on wet floors. Keep items that you use a lot in easy-to-reach places. If you need to reach something above you, use a strong step stool that has a grab bar. Keep electrical cords out of the way. Do not use floor polish or wax that makes floors slippery. If you must use wax, use non-skid floor wax. Do not have throw rugs and other things on the floor that can make you trip. What can I do with my stairs? Do not leave any items on the stairs. Make sure that there are handrails on both sides of the stairs and use them. Fix handrails that are broken or loose. Make sure that handrails are as long as the stairways. Check any carpeting to make sure that it is firmly attached to the stairs. Fix any carpet that is loose or worn. Avoid having throw rugs at the top or bottom of the stairs. If you do have throw rugs, attach them to the floor with carpet tape. Make sure that you have  a light switch at the top of the stairs and the bottom of the stairs. If you do not have them, ask someone to add them for you. What else can I do to help prevent falls? Wear shoes that: Do not have high heels. Have rubber bottoms. Are comfortable and fit you well. Are closed at the toe. Do not wear sandals. If you use a stepladder: Make sure that it is fully opened. Do not climb a closed stepladder. Make sure that both sides of the stepladder are locked into place. Ask someone to hold it for you, if possible. Clearly mark and make sure that you can see: Any grab bars or handrails. First and last steps. Where the edge of each step is. Use tools that help you move around (mobility aids) if they are needed. These  include: Canes. Walkers. Scooters. Crutches. Turn on the lights when you go into a dark area. Replace any light bulbs as soon as they burn out. Set up your furniture so you have a clear path. Avoid moving your furniture around. If any of your floors are uneven, fix them. If there are any pets around you, be aware of where they are. Review your medicines with your doctor. Some medicines can make you feel dizzy. This can increase your chance of falling. Ask your doctor what other things that you can do to help prevent falls. This information is not intended to replace advice given to you by your health care provider. Make sure you discuss any questions you have with your health care provider. Document Released: 11/06/2008 Document Revised: 06/18/2015 Document Reviewed: 02/14/2014 Elsevier Interactive Patient Education  2017 [employer].

## 2021-01-27 NOTE — Progress Notes (Signed)
Subjective:   Megan Valentine is a 71 y.o. female who presents for an Initial Medicare Annual Wellness Visit.  I connected with Megan Shattotoday by telephone and verified that I am speaking with the correct person using two identifiers. Location patient: home Location provider: work Persons participating in the virtual visit: patient, provider.   I discussed the limitations, risks, security and privacy concerns of performing an evaluation and management service by telephone and the availability of in person appointments. I also discussed with the patient that there may be a patient responsible charge related to this service. The patient expressed understanding and verbally consented to this telephonic visit.    Interactive audio and video telecommunications were attempted between this provider and patient, however failed, due to patient having technical difficulties OR patient did not have access to video capability.  We continued and completed visit with audio only.    Review of Systems     Cardiac Risk Factors include: advanced age (>85men, >41 women);dyslipidemia     Objective:    Today's Vitals   01/27/21 1334  PainSc: 3    There is no height or weight on file to calculate BMI.  Advanced Directives 01/27/2021 12/01/2020 11/17/2020 11/04/2020 10/15/2019 07/11/2019 12/14/2018  Does Patient Have a Medical Advance Directive? Yes Yes Yes Yes Yes Yes No  Type of Paramedic of [place];Living will [place];Living will [place];Living will [place];Living will [place];Living will [place];Living will -  Does patient want to make changes to medical advance directive? - No - Patient declined No - Patient declined No - Patient declined - - -  Copy of [place] in Chart? No - copy requested No - copy requested No - copy requested No - copy requested Yes  - validated most recent copy scanned in chart (See row information) - -  Would patient like information on creating a medical advance directive? - - - - - - No - Patient declined    Current Medications (verified) Outpatient Encounter Medications as of 01/27/2021  Medication Sig   acetaminophen (TYLENOL) 500 MG tablet Take 1 tablet (500 mg total) by mouth every 6 (six) hours.   aspirin 81 MG chewable tablet Chew 1 tablet (81 mg total) by mouth 2 (two) times daily.   cephALEXin (KEFLEX) 500 MG capsule Take 1 capsule (500 mg total) by mouth 4 (four) times daily.   estradiol (ESTRACE) 0.5 MG tablet TAKE 1 TABLET(0.5 MG) BY MOUTH DAILY   ezetimibe (ZETIA) 10 MG tablet Take 1 tablet (10 mg total) by mouth daily.   gabapentin (NEURONTIN) 100 MG capsule Take 1 capsule (100 mg total) by mouth at bedtime.   gabapentin (NEURONTIN) 300 MG capsule 1 capsule q HS   Ginkgo Biloba 120 MG CAPS Take 120 mg by mouth in the morning.   hydroxypropyl methylcellulose / hypromellose (ISOPTO TEARS / GONIOVISC) 2.5 % ophthalmic solution Place 1 drop into both eyes 4 (four) times daily as needed for dry eyes.   levothyroxine (SYNTHROID) 112 MCG tablet Take 1 tablet (112 mcg total) by mouth daily before breakfast.   loratadine (CLARITIN) 10 MG tablet Take 10 mg by mouth daily as needed for allergies.   methocarbamol (ROBAXIN) 500 MG tablet Take 1 tablet (500 mg total) by mouth every 12 (twelve) hours as needed for muscle spasms.   Multiple Vitamins-Minerals (HAIR/SKIN/NAILS/BIOTIN PO) Take 3 tablets by mouth in the morning.  ondansetron (ZOFRAN) 4 MG tablet Take 4 mg by mouth every 6 (six) hours as needed for nausea or vomiting.   potassium chloride SA (KLOR-CON) 20 MEQ tablet Take 1 tablet (20 mEq total) by mouth daily.   traMADol (ULTRAM) 50 MG tablet Take 1 tablet (50 mg total) by mouth every 6 (six) hours as needed for moderate pain.   No facility-administered encounter medications on file as of 01/27/2021.     Allergies (verified) Tetracycline, Dilaudid [hydromorphone hcl], and Other   History: Past Medical History:  Diagnosis Date   Allergy    Arthritis    Asthma    Cataract    Chronic bronchitis ([place])    Chronic kidney disease    stage 3, kidney infections   Complication of anesthesia    hard to wake up   Diverticulosis    Environmental allergies    GERD (gastroesophageal reflux disease)    Hiatal hernia    History of chicken pox    Hyperlipidemia    Hypothyroidism    IBS (irritable bowel syndrome)    Migraines    Mitral valve prolapse    Per pt . per Dr.Henry  [name].  does not have MVP   Neuromuscular disorder ([place])    neuropathy feet   Pneumonia    Rectal prolapse    Past Surgical History:  Procedure Laterality Date   ABDOMINAL HYSTERECTOMY  1999   APPENDECTOMY  1962   BREAST CYST ASPIRATION Bilateral    CHOLECYSTECTOMY N/A 12/17/2018   Procedure: LAPAROSCOPIC CHOLECYSTECTOMY;  Surgeon: Clovis Riley, MD;  Location: [place];  Service: General;  Laterality: N/A;   EYE SURGERY     cataract   JOINT REPLACEMENT     rt knee scope   REFRACTIVE SURGERY  2000   both eyes   retrocele repair     and bladder sling   TOTAL KNEE ARTHROPLASTY Right 03/24/2015   Procedure: TOTAL KNEE ARTHROPLASTY;  Surgeon: [name], MD;  Location: [place];  Service: Orthopedics;  Laterality: Right;   TOTAL KNEE ARTHROPLASTY Left 11/17/2020   Procedure: LEFT TOTAL KNEE ARTHROPLASTY;  Surgeon: [name], MD;  Location: [facility];  Service: Orthopedics;  Laterality: Left;   TUBAL LIGATION  1977   Family History  Problem Relation Age of Onset   Stomach cancer Maternal Grandmother    Hypertension Maternal Grandmother    Diabetes Maternal Grandmother    Stroke Maternal Grandmother    Cancer Maternal Grandmother        Stomach cancer   Colon cancer Paternal Grandfather    Heart attack Paternal Grandfather    Hyperlipidemia Mother    Diabetes Mother    Hypertension  Mother    Alzheimer's disease Mother    Diabetes Father    Hyperlipidemia Father    Heart disease Father    Heart attack Father    Stroke Paternal Grandmother    Social History   Socioeconomic History   Marital status: Widowed    Spouse name: Not on file   Number of children: Not on file   Years of education: 12   Highest education level: Not on file  Occupational History   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    Employer: [employer]  Tobacco Use   Smoking status: Former    Types: Cigarettes    Quit date: 03/23/1976    Years since quitting: 44.8   Smokeless tobacco: Never  Vaping Use   Vaping Use: Never  used  Substance and Sexual Activity   Alcohol use: No   Drug use: No   Sexual activity: Yes    Birth control/protection: None, Post-menopausal  Other Topics Concern   Not on file  Social History Narrative   Caffeine Use-yes   Regular exercise-no   Right handed    Lives alone   Social Determinants of Health   Financial Resource Strain: Low Risk    Difficulty of Paying Living Expenses: Not hard at all  Food Insecurity: No Food Insecurity   Worried About Charity fundraiser in the Last Year: Never true   Ran Out of Food in the Last Year: Never true  Transportation Needs: No Transportation Needs   Lack of Transportation (Medical): No   Lack of Transportation (Non-Medical): No  Physical Activity: Insufficiently Active   Days of Exercise per Week: 3 days   Minutes of Exercise per Session: 20 min  Stress: No Stress Concern Present   Feeling of Stress : Not at all  Social Connections: Moderately Isolated   Frequency of Communication with Friends and Family: Twice a week   Frequency of Social Gatherings with Friends and Family: Twice a week   Attends Religious Services: More than 4 times per year   Active Member of [employer] or Organizations: No   Attends Archivist Meetings: Never   Marital Status: Widowed    Tobacco Counseling Counseling  given: Not Answered   Clinical Intake:  Pre-visit preparation completed: Yes  Pain : 0-10 Pain Score: 3  Pain Location: Knee Pain Orientation: Left Pain Radiating Towards: knee replscement surgery Pain Onset: More than a month ago Pain Frequency: Intermittent     Nutritional Risks: None Diabetes: No  How often do you need to have someone help you when you read instructions, pamphlets, or other written materials from your doctor or pharmacy?: 1 - Never What is the last grade level you completed in school?: High school  Diabetic?no   Interpreter Needed?: No  Information entered by :: L.Hughie Melroy,LPN   Activities of Daily Living In your present state of health, do you have any difficulty performing the following activities: 01/27/2021 01/26/2021  Hearing? N N  Vision? N N  Difficulty concentrating or making decisions? N N  Walking or climbing stairs? N N  Dressing or bathing? N N  Doing errands, shopping? N N  Preparing Food and eating ? N N  Using the Toilet? N N  In the past six months, have you accidently leaked urine? N Y  Do you have problems with loss of bowel control? N N  Managing your Medications? N N  Managing your Finances? N N  Housekeeping or managing your Housekeeping? N N  Some recent data might be hidden    Patient Care Team: Libby Maw, MD as PCP - General (Family Medicine) Cameron Sprang, MD as Consulting Physician (Neurology)  Indicate any recent Medical Services you may have received from other than [facility] providers in the past year (date may be approximate).     Assessment:   This is a routine wellness examination for Melissa.  Hearing/Vision screen Vision Screening - Comments:: Annual eye exams wears glasses   Dietary issues and exercise activities discussed: Current Exercise Habits: The patient does not participate in regular exercise at present, Exercise limited by: orthopedic condition(s)   Goals Addressed   None    Depression  Screen [facility] 2/9 Scores 01/27/2021 01/27/2021 12/30/2020 09/30/2020 06/30/2020 02/18/2020 10/15/2019  PHQ - 2 Score 0  0 0 0 0 0 0  PHQ- 9 Score - - - - - - -    Fall Risk Fall Risk  01/27/2021 01/26/2021 12/30/2020 12/07/2020 09/30/2020  Falls in the past year? 0 0 0 0 0  Number falls in past yr: 0 - 0 0 -  Injury with Fall? 0 - - 0 -  Risk for fall due to : - - - - -  Follow up Falls evaluation completed - - - -    FALL RISK PREVENTION PERTAINING TO THE HOME:  Any stairs in or around the home? No  If so, are there any without handrails? No  Home free of loose throw rugs in walkways, pet beds, electrical cords, etc? Yes  Adequate lighting in your home to reduce risk of falls? Yes   ASSISTIVE DEVICES UTILIZED TO PREVENT FALLS:  Life alert? No  Use of a cane, walker or w/c? No  Grab bars in the bathroom? Yes  Shower chair or bench in shower? Yes  Elevated toilet seat or a handicapped toilet? Yes    Cognitive Function:  Normal cognitive status assessed by direct observation by this Nurse Health Advisor. No abnormalities found.     6CIT Screen 10/15/2019  What Year? 0 points  What month? 0 points  What time? 0 points  Count back from 20 0 points  Months in reverse 0 points  Repeat phrase 0 points  Total Score 0    Immunizations Immunization History  Administered Date(s) Administered   Fluad Quad(high Dose 65+) 10/05/2018, 10/10/2019   Influenza Whole 10/09/2015   Influenza, High Dose Seasonal PF 10/19/2016, 10/19/2017, 09/30/2020   Influenza,inj,Quad PF,6+ Mos 11/07/2013, 11/04/2014   Influenza-Unspecified 10/25/2010   Moderna Sars-Covid-2 Vaccination 03/07/2019, 04/05/2019, 12/07/2019, 08/21/2020   Pneumococcal Conjugate-13 06/08/2014   Pneumococcal Polysaccharide-23 07/09/2015   Tdap 11/25/2002, 03/24/2013   Zoster Recombinat (Shingrix) 10/24/2016, 12/30/2016   Zoster, Live 08/08/2015    TDAP status: Up to date  Flu Vaccine status: Up to date  Pneumococcal vaccine status: Up  to date  Covid-19 vaccine status: Completed vaccines  Qualifies for Shingles Vaccine? Yes   Zostavax completed Yes   Shingrix Completed?: Yes  Screening Tests Health Maintenance  Topic Date Due   COLONOSCOPY (Pts 45-38yrs Insurance coverage will need to be confirmed)  04/05/2021   MAMMOGRAM  09/22/2021   TETANUS/TDAP  03/25/2023   Pneumonia Vaccine 26+ Years old  Completed   INFLUENZA VACCINE  Completed   DEXA SCAN  Completed   Hepatitis C Screening  Completed   Zoster Vaccines- Shingrix  Completed   HPV VACCINES  Aged Out   COVID-19 Vaccine  Discontinued    Health Maintenance  There are no preventive care reminders to display for this patient.  Colorectal cancer screening: Type of screening: Colonoscopy. Completed 04/06/2011. Repeat every 10 years  Mammogram status: Completed 09/22/2020. Repeat every year  Bone Density status: Completed 11/05/2019. Results reflect: Bone density results: OSTEOPENIA. Repeat every 5 years.  Lung Cancer Screening: (Low Dose CT Chest recommended if Age 62-80 years, 30 pack-year currently smoking OR have quit w/in 15years.) does not qualify.   Lung Cancer Screening Referral: n/a  Additional Screening:  Hepatitis C Screening: does not qualify; Completed 09/11/2015  Vision Screening: Recommended annual ophthalmology exams for early detection of glaucoma and other disorders of the eye. Is the patient up to date with their annual eye exam?  Yes  Who is the provider or what is the name of the office in which the  patient attends annual eye exams? Dr.Digby  If pt is not established with a provider, would they like to be referred to a provider to establish care? No .   Dental Screening: Recommended annual dental exams for proper oral hygiene  Community Resource Referral / Chronic Care Management: CRR required this visit?  No   CCM required this visit?  No      Plan:     I have personally reviewed and noted the following in the patients  chart:   Medical and social history Use of alcohol, tobacco or illicit drugs  Current medications and supplements including opioid prescriptions. Patient is not currently taking opioid prescriptions. Functional ability and status Nutritional status Physical activity Advanced directives List of other physicians Hospitalizations, surgeries, and ER visits in previous 12 months Vitals Screenings to include cognitive, depression, and falls Referrals and appointments  In addition, I have reviewed and discussed with patient certain preventive protocols, quality metrics, and best practice recommendations. A written personalized care plan for preventive services as well as general preventive health recommendations were provided to patient.     Randel Pigg, LPN   06/26/2295   Nurse Notes: none

## 2021-01-28 ENCOUNTER — Other Ambulatory Visit: Payer: Self-pay | Admitting: Family Medicine

## 2021-01-28 ENCOUNTER — Encounter: Payer: PPO | Admitting: Physical Therapy

## 2021-01-31 ENCOUNTER — Other Ambulatory Visit: Payer: Self-pay | Admitting: Orthopaedic Surgery

## 2021-02-01 ENCOUNTER — Ambulatory Visit: Payer: PPO | Admitting: Family Medicine

## 2021-02-01 NOTE — Telephone Encounter (Signed)
Ok to refill 

## 2021-02-04 ENCOUNTER — Encounter: Payer: Self-pay | Admitting: Family Medicine

## 2021-02-04 ENCOUNTER — Ambulatory Visit: Payer: PPO | Admitting: Orthopaedic Surgery

## 2021-02-04 ENCOUNTER — Telehealth (INDEPENDENT_AMBULATORY_CARE_PROVIDER_SITE_OTHER): Payer: PPO | Admitting: Family Medicine

## 2021-02-04 VITALS — Temp 97.9°F

## 2021-02-04 DIAGNOSIS — R059 Cough, unspecified: Secondary | ICD-10-CM | POA: Diagnosis not present

## 2021-02-04 DIAGNOSIS — R0981 Nasal congestion: Secondary | ICD-10-CM

## 2021-02-04 MED ORDER — BENZONATATE 100 MG PO CAPS: ORAL_CAPSULE | ORAL | 0 refills | Status: DC

## 2021-02-04 NOTE — Progress Notes (Signed)
Virtual Visit via Video Note  I connected with Megan Valentine  on 02/04/21 at  3:00 PM EST by a video enabled telemedicine application and verified that I am speaking with the correct person using two identifiers.  Location patient: [place] Location provider:work or home office Persons participating in the virtual visit: patient, provider  I discussed the limitations and requested verbal permission for telemedicine visit. The patient expressed understanding and agreed to proceed.   HPI:  Acute telemedicine visit for sinus issues: -Onset: yesterday -Symptoms include: nasal congestion, stuffy nose, cough, alb -Denies:fever, CP, SOB, body aches, NVD -Has tried: left over cough medicine, allergy pill -Pertinent past medical history: see below -Pertinent medication allergies:  Allergies  Allergen Reactions   Tetracycline Hives and Itching   Dilaudid [Hydromorphone Hcl] Nausea And Vomiting   Other     Cigarette smoke and perfumes--flares asthma (develops bronchitis)  -COVID-19 vaccine status:  Immunization History  Administered Date(s) Administered   Fluad Quad(high Dose 65+) 10/05/2018, 10/10/2019   Influenza Whole 10/09/2015   Influenza, High Dose Seasonal PF 10/19/2016, 10/19/2017, 09/30/2020   Influenza,inj,Quad PF,6+ Mos 11/07/2013, 11/04/2014   Influenza-Unspecified 10/25/2010   Moderna Sars-Covid-2 Vaccination 03/07/2019, 04/05/2019, 12/07/2019, 08/21/2020   Pneumococcal Conjugate-13 06/08/2014   Pneumococcal Polysaccharide-23 07/09/2015   Tdap 11/25/2002, 03/24/2013   Zoster Recombinat (Shingrix) 10/24/2016, 12/30/2016   Zoster, Live 08/08/2015     ROS: See pertinent positives and negatives per HPI.  Past Medical History:  Diagnosis Date   Allergy    Arthritis    Asthma    Cataract    Chronic bronchitis ([place])    Chronic kidney disease    stage 3, kidney infections   Complication of anesthesia    hard to wake up   Diverticulosis    Environmental allergies    GERD  (gastroesophageal reflux disease)    Hiatal hernia    History of chicken pox    Hyperlipidemia    Hypothyroidism    IBS (irritable bowel syndrome)    Migraines    Mitral valve prolapse    Per pt . per Dr.Henry  [name].  does not have MVP   Neuromuscular disorder ([place])    neuropathy feet   Pneumonia    Rectal prolapse     Past Surgical History:  Procedure Laterality Date   ABDOMINAL HYSTERECTOMY  1999   APPENDECTOMY  1962   BREAST CYST ASPIRATION Bilateral    CHOLECYSTECTOMY N/A 12/17/2018   Procedure: LAPAROSCOPIC CHOLECYSTECTOMY;  Surgeon: Clovis Riley, MD;  Location: [facility];  Service: General;  Laterality: N/A;   EYE SURGERY     cataract   JOINT REPLACEMENT     rt knee scope   REFRACTIVE SURGERY  2000   both eyes   retrocele repair     and bladder sling   TOTAL KNEE ARTHROPLASTY Right 03/24/2015   Procedure: TOTAL KNEE ARTHROPLASTY;  Surgeon: [name], MD;  Location: [facility];  Service: Orthopedics;  Laterality: Right;   TOTAL KNEE ARTHROPLASTY Left 11/17/2020   Procedure: LEFT TOTAL KNEE ARTHROPLASTY;  Surgeon: [name], MD;  Location: [facility];  Service: Orthopedics;  Laterality: Left;   TUBAL LIGATION  1977     Current Outpatient Medications:    acetaminophen (TYLENOL) 500 MG tablet, Take 1 tablet (500 mg total) by mouth every 6 (six) hours., Disp: 30 tablet, Rfl: 0   aspirin 81 MG chewable tablet, Chew 1 tablet (81 mg total) by mouth 2 (two) times daily., Disp: , Rfl:  benzonatate (TESSALON PERLES) 100 MG capsule, 1-2 capsules up to twice daily as needed for cough, Disp: 30 capsule, Rfl: 0   estradiol (ESTRACE) 0.5 MG tablet, TAKE 1 TABLET(0.5 MG) BY MOUTH DAILY, Disp: 90 tablet, Rfl: 0   ezetimibe (ZETIA) 10 MG tablet, Take 1 tablet (10 mg total) by mouth daily., Disp: 90 tablet, Rfl: 3   gabapentin (NEURONTIN) 100 MG capsule, Take 1 capsule (100 mg total) by mouth at bedtime., Disp: 30 capsule, Rfl: 1   gabapentin (NEURONTIN) 300 MG capsule,  TAKE 1 CAPSULE BY MOUTH AT BEDTIME, Disp: 30 capsule, Rfl: 0   Ginkgo Biloba 120 MG CAPS, Take 120 mg by mouth in the morning., Disp: , Rfl:    hydroxypropyl methylcellulose / hypromellose (ISOPTO TEARS / GONIOVISC) 2.5 % ophthalmic solution, Place 1 drop into both eyes 4 (four) times daily as needed for dry eyes., Disp: , Rfl:    levothyroxine (SYNTHROID) 112 MCG tablet, Take 1 tablet (112 mcg total) by mouth daily before breakfast., Disp: 90 tablet, Rfl: 3   loratadine (CLARITIN) 10 MG tablet, Take 10 mg by mouth daily as needed for allergies., Disp: , Rfl:    methocarbamol (ROBAXIN) 500 MG tablet, Take 1 tablet (500 mg total) by mouth every 12 (twelve) hours as needed for muscle spasms., Disp: 30 tablet, Rfl: 1   Multiple Vitamins-Minerals (HAIR/SKIN/NAILS/BIOTIN PO), Take 3 tablets by mouth in the morning., Disp: , Rfl:    ondansetron (ZOFRAN) 4 MG tablet, Take 4 mg by mouth every 6 (six) hours as needed for nausea or vomiting., Disp: , Rfl:    potassium chloride SA (KLOR-CON) 20 MEQ tablet, Take 1 tablet (20 mEq total) by mouth daily., Disp: 30 tablet, Rfl: 3   traMADol (ULTRAM) 50 MG tablet, Take 1 tablet (50 mg total) by mouth every 6 (six) hours as needed for moderate pain., Disp: 30 tablet, Rfl: 0  EXAM:  VITALS per patient if applicable:  GENERAL: alert, oriented, appears well and in no acute distress  HEENT: atraumatic, conjunttiva clear, no obvious abnormalities on inspection of external nose and ears  NECK: normal movements of the head and neck  LUNGS: on inspection no signs of respiratory distress, breathing rate appears normal, no obvious gross SOB, gasping or wheezing  CV: no obvious cyanosis  MS: moves all visible extremities without noticeable abnormality  PSYCH/NEURO: pleasant and cooperative, no obvious depression or anxiety, speech and thought processing grossly intact  ASSESSMENT AND PLAN:  Discussed the following assessment and plan:  Nasal congestion  Cough,  unspecified type  -we discussed possible serious and likely etiologies, options for evaluation and workup, limitations of telemedicine visit vs in person visit, treatment, treatment risks and precautions. Pt is agreeable to treatment via telemedicine at this moment. Suspect VURI, Covid19 vs other. She agrees to self testing for covid and is aware if test is positive today or tomorrow that she can contact the [facility] pharmacy if wishes to consider an antiviral. O/w will treat with tessalon for cough, nasal saline lavage, prn alb if needed q 6 hours, saline, fluids, rest.  Advised to seek prompt virtual visit or in person care if worsening, new symptoms arise, or if is not improving with treatment as expected per our conversation of expected course. Discussed options for follow up care.   I discussed the assessment and treatment plan with the patient. The patient was provided an opportunity to ask questions and all were answered. The patient agreed with the plan and demonstrated an understanding of  the instructions.     Lucretia Kern, DO

## 2021-02-04 NOTE — Patient Instructions (Signed)
°  HOME CARE TIPS:    -I sent the medication(s) we discussed to your pharmacy: Meds ordered this encounter  Medications   benzonatate (TESSALON PERLES) 100 MG capsule    Sig: 1-2 capsules up to twice daily as needed for cough    Dispense:  30 capsule    Refill:  0     -can use tylenol if needed for fevers, aches and pains per instructions  -can use nasal saline a few times per day if you have nasal congestion  -stay hydrated, drink plenty of fluids and eat small healthy meals - avoid dairy  -can take 1000 IU (26mcg) Vit D3 and 100-500 mg of Vit C daily per instructions  -If the Covid test is positive, check out the CDC website for more information on home care, transmission and treatment for [place]   -stay home while sick, except to seek medical care. If you have COVID19, you will likely be contagious for 7-10 days. Flu or Influenza is likely contagious for about 7 days. Other respiratory viral infections remain contagious for 5-10+ days depending on the virus and many other factors. Wear a good mask that fits snugly (such as N95 or KN95) if around others to reduce the risk of transmission.  It was nice to meet you today, and I really hope you are feeling better soon. I help [facility] out with telemedicine visits on Tuesdays and Thursdays and am happy to help if you need a follow up virtual visit on those days. Otherwise, if you have any concerns or questions following this visit please schedule a follow up visit with your Primary Care doctor or seek care at a local urgent care clinic to avoid delays in care.    Seek in person care or schedule a follow up video visit promptly if your symptoms worsen, new concerns arise or you are not improving with treatment. Call 911 and/or seek emergency care if your symptoms are severe or life threatening.

## 2021-02-09 ENCOUNTER — Other Ambulatory Visit: Payer: Self-pay

## 2021-02-09 ENCOUNTER — Encounter: Payer: Self-pay | Admitting: Orthopaedic Surgery

## 2021-02-09 ENCOUNTER — Ambulatory Visit: Payer: PPO | Admitting: Orthopaedic Surgery

## 2021-02-09 DIAGNOSIS — Z96652 Presence of left artificial knee joint: Secondary | ICD-10-CM

## 2021-02-09 DIAGNOSIS — M7062 Trochanteric bursitis, left hip: Secondary | ICD-10-CM | POA: Diagnosis not present

## 2021-02-09 MED ORDER — LIDOCAINE HCL 1 % IJ SOLN
5.0000 mL | INTRAMUSCULAR | Status: AC | PRN
  Administered 2021-02-09: 5 mL

## 2021-02-09 MED ORDER — TRIAMCINOLONE ACETONIDE 0.1 % EX CREA: 1.0000 "application " | TOPICAL_CREAM | Freq: Two times a day (BID) | CUTANEOUS | 0 refills | Status: DC

## 2021-02-09 MED ORDER — METHYLPREDNISOLONE ACETATE 40 MG/ML IJ SUSP
80.0000 mg | INTRAMUSCULAR | Status: AC | PRN
  Administered 2021-02-09: 80 mg via INTRA_ARTICULAR

## 2021-02-09 NOTE — Progress Notes (Signed)
Office Visit Note   Patient: Megan Valentine           Date of Birth: Feb 04, 1950           MRN: [id number] Visit Date: 02/09/2021              Requested by: Libby Maw, MD [address] [place],  [place] [address] PCP: Libby Maw, MD   Assessment & Plan: Visit Diagnoses: Status post left total knee replacement.  Trochanteric bursitis left hip  Plan: 71 year old woman who is 3 months status post left total knee arthroplasty.  She reports she is doing better every day.  Her only complaint is of pain over the lateral side of her left hip she had an injection on the right hip for a similar problem is wondering if she could have an injection on this hip today.  She is still taking gabapentin but is also been putting Kenalog cream on the area of irritation on her leg and finds it very helpful.  She is wondering if she could have a refill of this  Follow-Up Instructions: No follow-ups on file.   Orders:  No orders of the defined types were placed in this encounter.  No orders of the defined types were placed in this encounter.     Procedures: Large Joint Inj: L greater trochanter on 02/09/2021 3:08 PM Indications: pain and diagnostic evaluation Details: 22 G 1.5 in needle, lateral approach  Arthrogram: No  Medications: 5 mL lidocaine 1 %; 80 mg methylPREDNISolone acetate 40 MG/ML Outcome: tolerated well, no immediate complications Procedure, treatment alternatives, risks and benefits explained, specific risks discussed. Consent was given by the patient.      Clinical Data: No additional findings.   Subjective: No chief complaint on file. Patient presents today for follow up on her left knee. She had a left total knee arthroplasty on 11/17/2020. She is almost three months out from surgery. She is getting better each day.    Review of Systems  All other systems reviewed and are negative.   Objective: Vital Signs: There were no vitals taken  for this visit.  Physical Exam 71 year old woman very comfortable to exam sitting comfortably in a chair Ortho Exam Examination of her left knee well-healed surgical scar there is no erythema no warmth no cellulitis no effusion.  She comes out to full extension and easily sustains a straight leg good quadricep strength.  She flexes to 115 degrees compartments are soft and nontender Specialty Comments:  No specialty comments available.  Imaging: No results found.   PMFS History: Patient Active Problem List   Diagnosis Date Noted   Urinary urgency 01/14/2021   Acute cystitis without hematuria 01/14/2021   Macrocytosis 12/07/2020   S/P TKR (total knee replacement) using cement, left 11/17/2020   Chronic pain of right knee 09/30/2020   Medication side effect 09/30/2020   Statin intolerance 06/30/2020   Trigger point of shoulder region, left 02/25/2020   Elevated glucose 08/15/2019   Anemia 02/15/2019   Abdominal pain 12/14/2018   Sinus pressure 11/12/2018   History of 2019 novel coronavirus disease (COVID-19) 11/12/2018   Pleurisy 06/05/2018   Enterocele 06/05/2018   Rectocele 05/23/2018   Vaginal vault prolapse 05/23/2018   SUI (stress urinary incontinence, female) 05/23/2018   Asthma 05/07/2018   Stage 3a chronic kidney disease ([place]) 01/15/2018   Edema 12/15/2017   Insomnia 11/20/2017   Seborrheic dermatitis 11/16/2017   Seasonal allergic rhinitis due to pollen 11/16/2017  Menopausal and female climacteric states 10/19/2017   Constipation 03/26/2015   Unilateral primary osteoarthritis, left knee 11/17/2014   Healthcare maintenance 04/25/2014   Hypokalemia 09/15/2011   Hypothyroidism 08/21/2008   Mixed hyperlipidemia 08/21/2008   Past Medical History:  Diagnosis Date   Allergy    Arthritis    Asthma    Cataract    Chronic bronchitis ([facility])    Chronic kidney disease    stage 3, kidney infections   Complication of anesthesia     hard to wake up   Diverticulosis    Environmental allergies    GERD (gastroesophageal reflux disease)    Hiatal hernia    History of chicken pox    Hyperlipidemia    Hypothyroidism    IBS (irritable bowel syndrome)    Migraines    Mitral valve prolapse    Per pt . per Dr.Henry  [name].  does not have MVP   Neuromuscular disorder ([place])    neuropathy feet   Pneumonia    Rectal prolapse     Family History  Problem Relation Age of Onset   Stomach cancer Maternal Grandmother    Hypertension Maternal Grandmother    Diabetes Maternal Grandmother    Stroke Maternal Grandmother    Cancer Maternal Grandmother        Stomach cancer   Colon cancer Paternal Grandfather    Heart attack Paternal Grandfather    Hyperlipidemia Mother    Diabetes Mother    Hypertension Mother    Alzheimer's disease Mother    Diabetes Father    Hyperlipidemia Father    Heart disease Father    Heart attack Father    Stroke Paternal Grandmother     Past Surgical History:  Procedure Laterality Date   ABDOMINAL HYSTERECTOMY  1999   APPENDECTOMY  1962   BREAST CYST ASPIRATION Bilateral    CHOLECYSTECTOMY N/A 12/17/2018   Procedure: LAPAROSCOPIC CHOLECYSTECTOMY;  Surgeon: Clovis Riley, MD;  Location: [facility];  Service: General;  Laterality: N/A;   EYE SURGERY     cataract   JOINT REPLACEMENT     rt knee scope   REFRACTIVE SURGERY  2000   both eyes   retrocele repair     and bladder sling   TOTAL KNEE ARTHROPLASTY Right 03/24/2015   Procedure: TOTAL KNEE ARTHROPLASTY;  Surgeon: [name], MD;  Location: [facility];  Service: Orthopedics;  Laterality: Right;   TOTAL KNEE ARTHROPLASTY Left 11/17/2020   Procedure: LEFT TOTAL KNEE ARTHROPLASTY;  Surgeon: [name], MD;  Location: [facility];  Service: Orthopedics;  Laterality: Left;   TUBAL LIGATION  1977   Social History   Occupational History   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

## 2021-02-19 ENCOUNTER — Other Ambulatory Visit: Payer: Self-pay

## 2021-02-19 ENCOUNTER — Ambulatory Visit (INDEPENDENT_AMBULATORY_CARE_PROVIDER_SITE_OTHER): Payer: PPO

## 2021-02-19 ENCOUNTER — Ambulatory Visit
Admission: EM | Admit: 2021-02-19 | Discharge: 2021-02-19 | Disposition: A | Discharge status: Home / Self Care | Payer: PPO | Attending: Internal Medicine | Admitting: Internal Medicine

## 2021-02-19 DIAGNOSIS — J069 Acute upper respiratory infection, unspecified: Secondary | ICD-10-CM | POA: Diagnosis not present

## 2021-02-19 DIAGNOSIS — R0602 Shortness of breath: Secondary | ICD-10-CM | POA: Diagnosis not present

## 2021-02-19 DIAGNOSIS — R053 Chronic cough: Secondary | ICD-10-CM

## 2021-02-19 MED ORDER — PREDNISONE 10 MG (21) PO TBPK: ORAL_TABLET | Freq: Every day | ORAL | 0 refills | Status: DC

## 2021-02-19 NOTE — ED Provider Notes (Signed)
[facility]    CSN: [id number] Arrival date & time: 02/19/21  0820      History   Chief Complaint Chief Complaint  Patient presents with   Nasal Congestion    HPI Megan Valentine is a 71 y.o. female.   Patient presents with nasal congestion, nonproductive cough, shortness of breath, sore throat, right ear pain chest tightness that started approximately 2 weeks ago.  Patient had a video visit when symptoms first started and was diagnosed with viral upper respiratory infection.  She reports that she has been using over-the-counter symptomatic treatment with minimal improvement in symptoms.  Denies any known fevers.  Patient does have history of asthma and has been using her prescribed inhalers but reports that she has not had to use her albuterol rescue inhaler more often while being acutely ill.    Past Medical History:  Diagnosis Date   Allergy    Arthritis    Asthma    Cataract    Chronic bronchitis ([place])    Chronic kidney disease    stage 3, kidney infections   Complication of anesthesia    hard to wake up   Diverticulosis    Environmental allergies    GERD (gastroesophageal reflux disease)    Hiatal hernia    History of chicken pox    Hyperlipidemia    Hypothyroidism    IBS (irritable bowel syndrome)    Migraines    Mitral valve prolapse    Per pt . per Dr.Henry  [name].  does not have MVP   Neuromuscular disorder ([place])    neuropathy feet   Pneumonia    Rectal prolapse     Patient Active Problem List   Diagnosis Date Noted   Urinary urgency 01/14/2021   Acute cystitis without hematuria 01/14/2021   Macrocytosis 12/07/2020   S/P TKR (total knee replacement) using cement, left 11/17/2020   Chronic pain of right knee 09/30/2020   Medication side effect 09/30/2020   Statin intolerance 06/30/2020   Trigger point of shoulder region, left 02/25/2020   Elevated glucose 08/15/2019   Anemia 02/15/2019   Abdominal pain 12/14/2018   Sinus pressure  11/12/2018   History of 2019 novel coronavirus disease (COVID-19) 11/12/2018   Pleurisy 06/05/2018   Enterocele 06/05/2018   Rectocele 05/23/2018   Vaginal vault prolapse 05/23/2018   SUI (stress urinary incontinence, female) 05/23/2018   Asthma 05/07/2018   Stage 3a chronic kidney disease ([place]) 01/15/2018   Edema 12/15/2017   Insomnia 11/20/2017   Seborrheic dermatitis 11/16/2017   Seasonal allergic rhinitis due to pollen 11/16/2017   Menopausal and female climacteric states 10/19/2017   Constipation 03/26/2015   Unilateral primary osteoarthritis, left knee 11/17/2014   Healthcare maintenance 04/25/2014   Hypokalemia 09/15/2011   Hypothyroidism 08/21/2008   Mixed hyperlipidemia 08/21/2008    Past Surgical History:  Procedure Laterality Date   [place] Bilateral    CHOLECYSTECTOMY N/A 12/17/2018   Procedure: LAPAROSCOPIC CHOLECYSTECTOMY;  Surgeon: Clovis Riley, MD;  Location: [facility];  Service: General;  Laterality: N/A;   EYE SURGERY     cataract   JOINT REPLACEMENT     rt knee scope   REFRACTIVE SURGERY  2000   both eyes   retrocele repair     and bladder sling   TOTAL KNEE ARTHROPLASTY Right 03/24/2015   Procedure: TOTAL KNEE ARTHROPLASTY;  Surgeon: [name], MD;  Location: [facility];  Service: Orthopedics;  Laterality: Right;   TOTAL KNEE ARTHROPLASTY Left 11/17/2020   Procedure: LEFT TOTAL KNEE ARTHROPLASTY;  Surgeon: [name], MD;  Location: [facility];  Service: Orthopedics;  Laterality: Left;   TUBAL LIGATION  1977    OB History   No obstetric history on file.      Home Medications    Prior to Admission medications   Medication Sig Start Date End Date Taking? Authorizing Provider  predniSONE (STERAPRED UNI-PAK 21 TAB) 10 MG (21) TBPK tablet Take by mouth daily. Take 6 tabs by mouth daily  for 2 days, then 5 tabs for 2 days, then 4 tabs for 2 days, then 3 tabs for 2 days, 2  tabs for 2 days, then 1 tab by mouth daily for 2 days 02/19/21  Yes [place], [place] E, FNP  acetaminophen (TYLENOL) 500 MG tablet Take 1 tablet (500 mg total) by mouth every 6 (six) hours. 11/19/20   Cherylann Ratel, PA-C  aspirin 81 MG chewable tablet Chew 1 tablet (81 mg total) by mouth 2 (two) times daily. 11/19/20   Cherylann Ratel, PA-C  benzonatate (TESSALON PERLES) 100 MG capsule 1-2 capsules up to twice daily as needed for cough 02/04/21   Lucretia Kern, DO  estradiol (ESTRACE) 0.5 MG tablet TAKE 1 TABLET(0.5 MG) BY MOUTH DAILY 01/28/21   Libby Maw, MD  ezetimibe (ZETIA) 10 MG tablet Take 1 tablet (10 mg total) by mouth daily. 12/30/20   Libby Maw, MD  gabapentin (NEURONTIN) 100 MG capsule Take 1 capsule (100 mg total) by mouth at bedtime. 01/12/21   Persons, Bevely Palmer, PA  gabapentin (NEURONTIN) 300 MG capsule TAKE 1 CAPSULE BY MOUTH AT BEDTIME 02/01/21   [name], MD  Ginkgo Biloba 120 MG CAPS Take 120 mg by mouth in the morning.    [provider]  hydroxypropyl methylcellulose / hypromellose (ISOPTO TEARS / GONIOVISC) 2.5 % ophthalmic solution Place 1 drop into both eyes 4 (four) times daily as needed for dry eyes.    [provider]  levothyroxine (SYNTHROID) 112 MCG tablet Take 1 tablet (112 mcg total) by mouth daily before breakfast. 12/31/20   Libby Maw, MD  loratadine (CLARITIN) 10 MG tablet Take 10 mg by mouth daily as needed for allergies.    [provider]  methocarbamol (ROBAXIN) 500 MG tablet Take 1 tablet (500 mg total) by mouth every 12 (twelve) hours as needed for muscle spasms. 01/12/21   Persons, Bevely Palmer, PA  Multiple Vitamins-Minerals (HAIR/SKIN/NAILS/BIOTIN PO) Take 3 tablets by mouth in the morning.    [provider]  ondansetron (ZOFRAN) 4 MG tablet Take 4 mg by mouth every 6 (six) hours as needed for nausea or vomiting.    [provider]  potassium chloride SA (KLOR-CON) 20 MEQ  tablet Take 1 tablet (20 mEq total) by mouth daily. 12/07/20   Libby Maw, MD  traMADol (ULTRAM) 50 MG tablet Take 1 tablet (50 mg total) by mouth every 6 (six) hours as needed for moderate pain. 01/12/21   Persons, Bevely Palmer, PA  triamcinolone cream (KENALOG) 0.1 % Apply 1 application topically 2 (two) times daily. 02/09/21   Persons, Bevely Palmer, PA    Family History Family History  Problem Relation Age of Onset   Stomach cancer Maternal Grandmother    Hypertension Maternal Grandmother    Diabetes Maternal Grandmother    Stroke Maternal Grandmother    Cancer Maternal Grandmother  Stomach cancer   Colon cancer Paternal Grandfather    Heart attack Paternal Grandfather    Hyperlipidemia Mother    Diabetes Mother    Hypertension Mother    Alzheimer's disease Mother    Diabetes Father    Hyperlipidemia Father    Heart disease Father    Heart attack Father    Stroke Paternal Grandmother     Social History Social History   Tobacco Use   Smoking status: Former    Types: Cigarettes    Quit date: 03/23/1976    Years since quitting: 44.9   Smokeless tobacco: Never  Vaping Use   Vaping Use: Never used  Substance Use Topics   Alcohol use: No   Drug use: No     Allergies   Tetracycline, Dilaudid [hydromorphone hcl], and Other   Review of Systems Review of Systems Per HPI  Physical Exam Triage Vital Signs ED Triage Vitals  Enc Vitals Group     BP 02/19/21 0855 112/76     Pulse Rate 02/19/21 0855 93     Resp 02/19/21 0855 18     Temp 02/19/21 0855 98.2 F (36.8 C)     Temp Source 02/19/21 0855 Oral     SpO2 02/19/21 0855 97 %     Weight --      Height --      Head Circumference --      Peak Flow --      Pain Score 02/19/21 0856 0     Pain Loc --      Pain Edu? --      Excl. in [place]? --    No data found.  Updated Vital Signs BP 112/76 (BP Location: Left Arm)    Pulse 93    Temp 98.2 F (36.8 C) (Oral)    Resp 18    SpO2 97%   Visual  Acuity Right Eye Distance:   Left Eye Distance:   Bilateral Distance:    Right Eye Near:   Left Eye Near:    Bilateral Near:     Physical Exam Constitutional:      General: She is not in acute distress.    Appearance: Normal appearance. She is not toxic-appearing or diaphoretic.  HENT:     Head: Normocephalic and atraumatic.     Right Ear: Tympanic membrane and ear canal normal.     Left Ear: Tympanic membrane and ear canal normal.     Nose: Congestion present.     Mouth/Throat:     Mouth: Mucous membranes are moist.     Pharynx: No posterior oropharyngeal erythema.  Eyes:     Extraocular Movements: Extraocular movements intact.     Conjunctiva/sclera: Conjunctivae normal.     Pupils: Pupils are equal, round, and reactive to light.  Cardiovascular:     Rate and Rhythm: Normal rate and regular rhythm.     Pulses: Normal pulses.     Heart sounds: Normal heart sounds.  Pulmonary:     Effort: Pulmonary effort is normal. No respiratory distress.     Breath sounds: Normal breath sounds. No stridor. No wheezing, rhonchi or rales.  Abdominal:     General: Abdomen is flat. Bowel sounds are normal.     Palpations: Abdomen is soft.  Musculoskeletal:        General: Normal range of motion.     Cervical back: Normal range of motion.  Skin:    General: Skin is warm and dry.  Neurological:  General: No focal deficit present.     Mental Status: She is alert and oriented to person, place, and time. Mental status is at baseline.  Psychiatric:        Mood and Affect: Mood normal.        Behavior: Behavior normal.     UC Treatments / Results  Labs (all labs ordered are listed, but only abnormal results are displayed) Labs Reviewed - No data to display  EKG   Radiology DG Chest 2 View  Result Date: 02/19/2021 CLINICAL DATA:  Shortness of breath EXAM: CHEST - 2 VIEW COMPARISON:  05/08/2020 FINDINGS: The heart size and mediastinal contours are within normal limits. No focal  airspace consolidation, pleural effusion, or pneumothorax. Slight scoliotic curvature of the upper to mid thoracic spine. IMPRESSION: No active cardiopulmonary disease. Electronically Signed   By: Davina Poke D.O.   On: 02/19/2021 09:45    Procedures Procedures (including critical care time)  Medications Ordered in UC Medications - No data to display  Initial Impression / Assessment and Plan / UC Course  I have reviewed the triage vital signs and the nursing notes.  Pertinent labs & imaging results that were available during my care of the patient were reviewed by me and considered in my medical decision making (see chart for details).     Patient presents with symptoms likely from a viral upper respiratory infection. Differential includes bacterial pneumonia, sinusitis, allergic rhinitis, Covid 19, flu. Do not suspect underlying cardiopulmonary process. Patient is nontoxic appearing and not in need of emergent medical intervention.  Do not think that viral testing is necessary given duration of symptoms.  Patient is at high risk for asthma exacerbation given shortness of breath on exam and history of asthma.  Will prescribe prednisone steroid as patient has previously taken this before and has tolerated well.  Chest x-ray was negative for any acute cardiopulmonary process.  Return if symptoms fail to improve in 1-2 weeks. Patient states understanding and is agreeable.  Discharged with PCP followup.  Final Clinical Impressions(s) / UC Diagnoses   Final diagnoses:  Acute upper respiratory infection  Persistent cough     Discharge Instructions      Your chest x ray was normal. You are being treated with prednisone steroid to decrease inflammation.      ED Prescriptions     Medication Sig Dispense Auth. Provider   predniSONE (STERAPRED UNI-PAK 21 TAB) 10 MG (21) TBPK tablet Take by mouth daily. Take 6 tabs by mouth daily  for 2 days, then 5 tabs for 2 days, then 4 tabs for  2 days, then 3 tabs for 2 days, 2 tabs for 2 days, then 1 tab by mouth daily for 2 days 42 tablet [place], Michele Rockers, [place]      PDMP not reviewed this encounter.   Teodora Medici, [place] 02/19/21 [phone]

## 2021-02-19 NOTE — ED Triage Notes (Signed)
Pt c/o nasal congestion, cough, sore throat, right ear ache, chest "is real tight" it hurts to breathe, malaise.   Denies headache, nausea, vomiting, diarrhea, constipation  Onset ~ 1-2 weeks ago

## 2021-02-19 NOTE — Discharge Instructions (Signed)
Your chest x ray was normal. You are being treated with prednisone steroid to decrease inflammation.

## 2021-02-22 DIAGNOSIS — H2129 Other iris atrophy: Secondary | ICD-10-CM | POA: Diagnosis not present

## 2021-02-22 DIAGNOSIS — H40022 Open angle with borderline findings, high risk, left eye: Secondary | ICD-10-CM | POA: Diagnosis not present

## 2021-02-22 DIAGNOSIS — H40011 Open angle with borderline findings, low risk, right eye: Secondary | ICD-10-CM | POA: Diagnosis not present

## 2021-02-22 DIAGNOSIS — H16223 Keratoconjunctivitis sicca, not specified as Sjogren's, bilateral: Secondary | ICD-10-CM | POA: Diagnosis not present

## 2021-03-03 ENCOUNTER — Ambulatory Visit
Admission: EM | Admit: 2021-03-03 | Discharge: 2021-03-03 | Disposition: A | Discharge status: Home / Self Care | Payer: PPO | Attending: Urgent Care | Admitting: Urgent Care

## 2021-03-03 ENCOUNTER — Ambulatory Visit (INDEPENDENT_AMBULATORY_CARE_PROVIDER_SITE_OTHER): Payer: PPO

## 2021-03-03 ENCOUNTER — Telehealth: Payer: Self-pay | Admitting: Urgent Care

## 2021-03-03 ENCOUNTER — Other Ambulatory Visit: Payer: Self-pay

## 2021-03-03 DIAGNOSIS — J018 Other acute sinusitis: Secondary | ICD-10-CM | POA: Diagnosis not present

## 2021-03-03 DIAGNOSIS — R3915 Urgency of urination: Secondary | ICD-10-CM | POA: Diagnosis not present

## 2021-03-03 DIAGNOSIS — M545 Low back pain, unspecified: Secondary | ICD-10-CM | POA: Diagnosis not present

## 2021-03-03 DIAGNOSIS — R350 Frequency of micturition: Secondary | ICD-10-CM

## 2021-03-03 LAB — POCT URINALYSIS DIP (MANUAL ENTRY)
Bilirubin, UA: NEGATIVE
Blood, UA: NEGATIVE
Glucose, UA: NEGATIVE mg/dL
Ketones, POC UA: NEGATIVE mg/dL
Leukocytes, UA: NEGATIVE
Nitrite, UA: NEGATIVE
Protein Ur, POC: NEGATIVE mg/dL
Spec Grav, UA: 1.025 (ref 1.010–1.025)
Urobilinogen, UA: 0.2 E.U./dL
pH, UA: 5.5 (ref 5.0–8.0)

## 2021-03-03 MED ORDER — LEVOCETIRIZINE DIHYDROCHLORIDE 5 MG PO TABS: 5.0000 mg | ORAL_TABLET | Freq: Every evening | ORAL | 0 refills | Status: DC

## 2021-03-03 MED ORDER — AMOXICILLIN 875 MG PO TABS
875.0000 mg | ORAL_TABLET | Freq: Two times a day (BID) | ORAL | 0 refills | Status: DC
Start: 2021-03-03 — End: 2021-05-11

## 2021-03-03 MED ORDER — PREDNISONE 50 MG PO TABS: 50.0000 mg | ORAL_TABLET | Freq: Every day | ORAL | 0 refills | Status: DC

## 2021-03-03 MED ORDER — METHOCARBAMOL 500 MG PO TABS: 500.0000 mg | ORAL_TABLET | Freq: Every evening | ORAL | 0 refills | Status: DC | PRN

## 2021-03-03 MED ORDER — ACETAMINOPHEN 325 MG PO TABS: 650.0000 mg | ORAL_TABLET | Freq: Four times a day (QID) | ORAL | 0 refills | Status: AC | PRN

## 2021-03-03 NOTE — Discharge Instructions (Signed)

## 2021-03-03 NOTE — ED Provider Notes (Signed)
Megan Valentine   MRN: [id number] DOB: [birth year]  Subjective:   Megan Valentine is a 71 y.o. female presenting for 1+ month history of persistent urinary frequency, urinary urgency, low back pain.  She does have a urologist and has had surgery due to stress urinary incontinence.  Has not followed up with them in about 1 to 2 years.  Of note, patient has already undergone 2 rounds of antibiotics without definitive signs of an urinary tract infection is seen from her urine cultures.  Patient also has concerns about acute on chronic sinus pressure and sinus inflammation.  Has a history of allergic rhinitis and does not take anything consistently for this.  She does get nosebleeds when she takes allergy medicines and therefore does not use nasal sprays.  She did undergo a prednisone course recently.  Has also had a chest x-ray done which was negative.  Patient denies any chest pain, shortness of breath or wheezing.  She does have a history of asthma but feels like this is not bothering her now.  No current facility-administered medications for this encounter.  Current Outpatient Medications:    acetaminophen (TYLENOL) 500 MG tablet, Take 1 tablet (500 mg total) by mouth every 6 (six) hours., Disp: 30 tablet, Rfl: 0   aspirin 81 MG chewable tablet, Chew 1 tablet (81 mg total) by mouth 2 (two) times daily., Disp: , Rfl:    estradiol (ESTRACE) 0.5 MG tablet, TAKE 1 TABLET(0.5 MG) BY MOUTH DAILY, Disp: 90 tablet, Rfl: 0   ezetimibe (ZETIA) 10 MG tablet, Take 1 tablet (10 mg total) by mouth daily., Disp: 90 tablet, Rfl: 3   gabapentin (NEURONTIN) 100 MG capsule, Take 1 capsule (100 mg total) by mouth at bedtime., Disp: 30 capsule, Rfl: 1   gabapentin (NEURONTIN) 300 MG capsule, TAKE 1 CAPSULE BY MOUTH AT BEDTIME, Disp: 30 capsule, Rfl: 0   Ginkgo Biloba 120 MG CAPS, Take 120 mg by mouth in the morning., Disp: , Rfl:    hydroxypropyl methylcellulose / hypromellose (ISOPTO TEARS / GONIOVISC) 2.5 %  ophthalmic solution, Place 1 drop into both eyes 4 (four) times daily as needed for dry eyes., Disp: , Rfl:    levothyroxine (SYNTHROID) 112 MCG tablet, Take 1 tablet (112 mcg total) by mouth daily before breakfast., Disp: 90 tablet, Rfl: 3   loratadine (CLARITIN) 10 MG tablet, Take 10 mg by mouth daily as needed for allergies., Disp: , Rfl:    methocarbamol (ROBAXIN) 500 MG tablet, Take 1 tablet (500 mg total) by mouth every 12 (twelve) hours as needed for muscle spasms., Disp: 30 tablet, Rfl: 1   Multiple Vitamins-Minerals (HAIR/SKIN/NAILS/BIOTIN PO), Take 3 tablets by mouth in the morning., Disp: , Rfl:    ondansetron (ZOFRAN) 4 MG tablet, Take 4 mg by mouth every 6 (six) hours as needed for nausea or vomiting., Disp: , Rfl:    potassium chloride SA (KLOR-CON) 20 MEQ tablet, Take 1 tablet (20 mEq total) by mouth daily., Disp: 30 tablet, Rfl: 3   predniSONE (STERAPRED UNI-PAK 21 TAB) 10 MG (21) TBPK tablet, Take by mouth daily. Take 6 tabs by mouth daily  for 2 days, then 5 tabs for 2 days, then 4 tabs for 2 days, then 3 tabs for 2 days, 2 tabs for 2 days, then 1 tab by mouth daily for 2 days, Disp: 42 tablet, Rfl: 0   traMADol (ULTRAM) 50 MG tablet, Take 1 tablet (50 mg total) by mouth every 6 (six) hours as needed for  moderate pain., Disp: 30 tablet, Rfl: 0   triamcinolone cream (KENALOG) 0.1 %, Apply 1 application topically 2 (two) times daily., Disp: 80 g, Rfl: 0   Allergies  Allergen Reactions   Tetracycline Hives and Itching   Dilaudid [Hydromorphone Hcl] Nausea And Vomiting   Other     Cigarette smoke and perfumes--flares asthma (develops bronchitis)    Past Medical History:  Diagnosis Date   Allergy    Arthritis    Asthma    Cataract    Chronic bronchitis ([facility])    Chronic kidney disease    stage 3, kidney infections   Complication of anesthesia    hard to wake up   Diverticulosis    Environmental allergies    GERD (gastroesophageal reflux disease)    Hiatal hernia     History of chicken pox    Hyperlipidemia    Hypothyroidism    IBS (irritable bowel syndrome)    Migraines    Mitral valve prolapse    Per pt . per Dr.Henry  [name].  does not have MVP   Neuromuscular disorder ([place])    neuropathy feet   Pneumonia    Rectal prolapse      Past Surgical History:  Procedure Laterality Date   ABDOMINAL HYSTERECTOMY  1999   APPENDECTOMY  1962   BREAST CYST ASPIRATION Bilateral    CHOLECYSTECTOMY N/A 12/17/2018   Procedure: LAPAROSCOPIC CHOLECYSTECTOMY;  Surgeon: Clovis Riley, MD;  Location: [facility];  Service: General;  Laterality: N/A;   EYE SURGERY     cataract   JOINT REPLACEMENT     rt knee scope   REFRACTIVE SURGERY  2000   both eyes   retrocele repair     and bladder sling   TOTAL KNEE ARTHROPLASTY Right 03/24/2015   Procedure: TOTAL KNEE ARTHROPLASTY;  Surgeon: [name], MD;  Location: [facility];  Service: Orthopedics;  Laterality: Right;   TOTAL KNEE ARTHROPLASTY Left 11/17/2020   Procedure: LEFT TOTAL KNEE ARTHROPLASTY;  Surgeon: [name], MD;  Location: [facility];  Service: Orthopedics;  Laterality: Left;   TUBAL LIGATION  1977    Family History  Problem Relation Age of Onset   Stomach cancer Maternal Grandmother    Hypertension Maternal Grandmother    Diabetes Maternal Grandmother    Stroke Maternal Grandmother    Cancer Maternal Grandmother        Stomach cancer   Colon cancer Paternal Grandfather    Heart attack Paternal Grandfather    Hyperlipidemia Mother    Diabetes Mother    Hypertension Mother    Alzheimer's disease Mother    Diabetes Father    Hyperlipidemia Father    Heart disease Father    Heart attack Father    Stroke Paternal Grandmother     Social History   Tobacco Use   Smoking status: Former    Types: Cigarettes    Quit date: 03/23/1976    Years since quitting: 44.9   Smokeless tobacco: Never  Vaping Use   Vaping Use: Never used  Substance Use Topics   Alcohol use: No   Drug use: No     ROS   Objective:   Vitals: BP 120/79 (BP Location: Left Arm)    Pulse 81    Temp 98 F (36.7 C) (Oral)    Resp 18    SpO2 95%   Physical Exam Constitutional:      General: She is not in acute distress.    Appearance: Normal  appearance. She is well-developed and normal weight. She is not ill-appearing, toxic-appearing or diaphoretic.  HENT:     Head: Normocephalic and atraumatic.     Right Ear: Tympanic membrane, ear canal and external ear normal. No drainage or tenderness. No middle ear effusion. There is no impacted cerumen. Tympanic membrane is not erythematous.     Left Ear: Tympanic membrane, ear canal and external ear normal. No drainage or tenderness.  No middle ear effusion. There is no impacted cerumen. Tympanic membrane is not erythematous.     Nose: Congestion present. No rhinorrhea.     Comments: Nasal mucosa boggy and edematous.     Mouth/Throat:     Mouth: Mucous membranes are moist. No oral lesions.     Pharynx: No pharyngeal swelling, oropharyngeal exudate, posterior oropharyngeal erythema or uvula swelling.     Tonsils: No tonsillar exudate or tonsillar abscesses.  Eyes:     General: No scleral icterus.       Right eye: No discharge.        Left eye: No discharge.     Extraocular Movements: Extraocular movements intact.     Right eye: Normal extraocular motion.     Left eye: Normal extraocular motion.     Conjunctiva/sclera: Conjunctivae normal.  Cardiovascular:     Rate and Rhythm: Normal rate.     Heart sounds: No murmur heard.   No friction rub. No gallop.  Pulmonary:     Effort: Pulmonary effort is normal. No respiratory distress.     Breath sounds: No stridor. No wheezing, rhonchi or rales.  Chest:     Chest wall: No tenderness.  Musculoskeletal:     Cervical back: Normal range of motion and neck supple.     Lumbar back: Tenderness (across areas outlined) present. No swelling, edema, deformity, signs of trauma, lacerations, spasms or bony  tenderness. Normal range of motion. Negative right straight leg raise test and negative left straight leg raise test. No scoliosis.       Back:  Lymphadenopathy:     Cervical: No cervical adenopathy.  Skin:    General: Skin is warm and dry.  Neurological:     General: No focal deficit present.     Mental Status: She is alert and oriented to person, place, and time.     Motor: No weakness.     Coordination: Coordination normal.     Gait: Gait normal.     Deep Tendon Reflexes: Reflexes normal.  Psychiatric:        Mood and Affect: Mood normal.        Behavior: Behavior normal.        Thought Content: Thought content normal.        Judgment: Judgment normal.    Results for orders placed or performed during the hospital encounter of 03/03/21 (from the past 24 hour(s))  POCT urinalysis dipstick     Status: None   Collection Time: 03/03/21  1:24 PM  Result Value Ref Range   Color, UA yellow yellow   Clarity, UA clear clear   Glucose, UA negative negative mg/dL   Bilirubin, UA negative negative   Ketones, POC UA negative negative mg/dL   Spec Grav, UA 1.025 1.010 - 1.025   Blood, UA negative negative   pH, UA 5.5 5.0 - 8.0   Protein Ur, POC negative negative mg/dL   Urobilinogen, UA 0.2 0.2 or 1.0 E.U./dL   Nitrite, UA Negative Negative   Leukocytes, UA Negative Negative  Assessment and Plan :   PDMP not reviewed this encounter.  1. Urinary frequency   2. Urinary urgency   3. Acute non-recurrent sinusitis of other sinus   4. Acute bilateral low back pain without sciatica     X-ray over-read was pending at time of discharge, recommended follow up with only abnormal results. Otherwise will not call for negative over-read. Patient was in agreement. Will start empiric treatment for sinusitis with amoxicillin.  Recommended supportive care otherwise including the use of oral antihistamine, decongestant. Urine culture pending, follow up with her urologist. Will treat based off of  her urine culture results. Use APAP and Robaxin for her back pain. Counseled patient on potential for adverse effects with medications prescribed/recommended today, ER and return-to-clinic precautions discussed, patient verbalized understanding.    Jaynee Eagles, [place] 03/03/21 1526

## 2021-03-03 NOTE — Telephone Encounter (Signed)
DG Lumbar Spine Complete  Result Date: 03/03/2021 CLINICAL DATA:  Lower back pain EXAM: LUMBAR SPINE - COMPLETE 4+ VIEW COMPARISON:  CT 12/14/2018 FINDINGS: There is no evidence of acute lumbar spine fracture. Straightening of the lumbar lordosis. There is multilevel degenerative disc disease worst at L2-L3 with moderate disc height loss. Mild multilevel facet arthropathy. IMPRESSION: Multilevel degenerative disc disease worst at L2-L3 with moderate disc height loss. Mild multilevel facet arthropathy. Electronically Signed   By: Maurine Simmering M.D.   On: 03/03/2021 15:31     I discussed results with patient and she is still having persistent back pain I offered her 1 more round of steroids to help her in the setting of diffuse degenerative disc disease of the lumbar region.  Counseled that she follow-up with her regular doctor to pursue a referral to a spine specialist.  She is agreeable, will follow up with them soon as possible. Counseled patient on potential for adverse effects with medications prescribed/recommended today, ER and return-to-clinic precautions discussed, patient verbalized understanding.

## 2021-03-03 NOTE — ED Triage Notes (Signed)
Pt states tx'd for UTI and sinus infection 2wks ago and still having sinus congestion and urinary urgency/frequency.

## 2021-03-04 ENCOUNTER — Encounter: Payer: Self-pay | Admitting: Family Medicine

## 2021-03-04 DIAGNOSIS — M479 Spondylosis, unspecified: Secondary | ICD-10-CM

## 2021-03-05 LAB — URINE CULTURE

## 2021-03-12 ENCOUNTER — Telehealth: Payer: Self-pay | Admitting: *Deleted

## 2021-03-12 NOTE — Telephone Encounter (Signed)
Ortho bundle 90 day call completed. 

## 2021-03-14 ENCOUNTER — Encounter: Payer: Self-pay | Admitting: Family Medicine

## 2021-03-16 ENCOUNTER — Other Ambulatory Visit: Payer: Self-pay | Admitting: Physician Assistant

## 2021-03-18 ENCOUNTER — Other Ambulatory Visit: Payer: Self-pay | Admitting: Family Medicine

## 2021-03-31 ENCOUNTER — Ambulatory Visit: Payer: PPO | Admitting: Family Medicine

## 2021-04-02 NOTE — Progress Notes (Signed)
[place] [facility] [place] Phone: [phone] Subjective:   [name], am serving as a scribe for Dr. Hulan Saas.  This visit occurred during the SARS-CoV-2 public health emergency.  Safety protocols were in place, including screening questions prior to the visit, additional usage of staff PPE, and extensive cleaning of exam room while observing appropriate contact time as indicated for disinfecting solutions.  I'm seeing this patient by the request  of:  Libby Maw, MD  CC: Low back pain  JIR:[id number]  Last seen 2016  Kishia B Sukup is a 71 y.o. female coming in with complaint of DDD of lumbar and cervical pain. Pain entire spine. Patient had knee replacement in October 2022 and now her thoracic spine is also painful. Patient complains of medial foot numbness that radiates into the bottom of the foot. L>R. Patient is on gabapentin due to severe nerve pain that was sensitive to the touch in left lower leg. Stopped using gabapentin as this pain has subsided. Pain in spine is constant. Using mm relaxer prn and sleep with heat.    Reviewing patient's chart recently in February he was seen in the emergency department for urinary frequency  Patient did have lumbar x-rays taken on February 8 that were independently visualized by me today.  Patient was found to have moderate arthritic changes mostly at the L2-L3 level.  Mild facet arthropathy throughout the lumbar spine.    Past Medical History:  Diagnosis Date   Allergy    Arthritis    Asthma    Cataract    Chronic bronchitis ([place])    Chronic kidney disease    stage 3, kidney infections   Complication of anesthesia    hard to wake up   Diverticulosis    Environmental allergies    GERD (gastroesophageal reflux disease)    Hiatal hernia    History of chicken pox    Hyperlipidemia    Hypothyroidism    IBS (irritable bowel syndrome)    Migraines    Mitral  valve prolapse    Per pt . per Dr.Henry  [name].  does not have MVP   Neuromuscular disorder ([place])    neuropathy feet   Pneumonia    Rectal prolapse    Past Surgical History:  Procedure Laterality Date   ABDOMINAL HYSTERECTOMY  1999   APPENDECTOMY  1962   BREAST CYST ASPIRATION Bilateral    CHOLECYSTECTOMY N/A 12/17/2018   Procedure: LAPAROSCOPIC CHOLECYSTECTOMY;  Surgeon: Clovis Riley, MD;  Location: [place] [facility];  Service: General;  Laterality: N/A;   EYE SURGERY     cataract   JOINT REPLACEMENT     rt knee scope   REFRACTIVE SURGERY  2000   both eyes   retrocele repair     and bladder sling   TOTAL KNEE ARTHROPLASTY Right 03/24/2015   Procedure: TOTAL KNEE ARTHROPLASTY;  Surgeon: [name], MD;  Location: [facility];  Service: Orthopedics;  Laterality: Right;   TOTAL KNEE ARTHROPLASTY Left 11/17/2020   Procedure: LEFT TOTAL KNEE ARTHROPLASTY;  Surgeon: [name], MD;  Location: [facility];  Service: Orthopedics;  Laterality: Left;   TUBAL LIGATION  1977   Social History   Socioeconomic History   Marital status: Widowed    Spouse name: Not on file   Number of children: Not on file   Years of education: 12   Highest education level: Not on file  Occupational History  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    Employer: [employer]  Tobacco Use   Smoking status: Former    Types: Cigarettes    Quit date: 03/23/1976    Years since quitting: 45.0   Smokeless tobacco: Never  Vaping Use   Vaping Use: Never used  Substance and Sexual Activity   Alcohol use: No   Drug use: No   Sexual activity: Yes    Birth control/protection: None, Post-menopausal  Other Topics Concern   Not on file  Social History Narrative   Caffeine Use-yes   Regular exercise-no   Right handed    Lives alone   Social Determinants of Health   Financial Resource Strain: Low Risk    Difficulty of Paying Living Expenses: Not hard at all  Food Insecurity: No Food Insecurity    Worried About Charity fundraiser in the Last Year: Never true   [place] in the Last Year: Never true  Transportation Needs: No Transportation Needs   Lack of Transportation (Medical): No   Lack of Transportation (Non-Medical): No  Physical Activity: Insufficiently Active   Days of Exercise per Week: 3 days   Minutes of Exercise per Session: 20 min  Stress: No Stress Concern Present   Feeling of Stress : Not at all  Social Connections: Moderately Isolated   Frequency of Communication with Friends and Family: Twice a week   Frequency of Social Gatherings with Friends and Family: Twice a week   Attends Religious Services: More than 4 times per year   Active Member of [employer] or Organizations: No   Attends Archivist Meetings: Never   Marital Status: Widowed   Allergies  Allergen Reactions   Tetracycline Hives and Itching   Dilaudid [Hydromorphone Hcl] Nausea And Vomiting   Other     Cigarette smoke and perfumes--flares asthma (develops bronchitis)   Family History  Problem Relation Age of Onset   Stomach cancer Maternal Grandmother    Hypertension Maternal Grandmother    Diabetes Maternal Grandmother    Stroke Maternal Grandmother    Cancer Maternal Grandmother        Stomach cancer   Colon cancer Paternal Grandfather    Heart attack Paternal Grandfather    Hyperlipidemia Mother    Diabetes Mother    Hypertension Mother    Alzheimer's disease Mother    Diabetes Father    Hyperlipidemia Father    Heart disease Father    Heart attack Father    Stroke Paternal Grandmother     Current Outpatient Medications (Endocrine & Metabolic):    estradiol (ESTRACE) 0.5 MG tablet, TAKE 1 TABLET(0.5 MG) BY MOUTH DAILY   levothyroxine (SYNTHROID) 112 MCG tablet, Take 1 tablet (112 mcg total) by mouth daily before breakfast.   predniSONE (DELTASONE) 50 MG tablet, Take 1 tablet (50 mg total) by mouth daily with breakfast.  Current Outpatient Medications  (Cardiovascular):    ezetimibe (ZETIA) 10 MG tablet, Take 1 tablet (10 mg total) by mouth daily.  Current Outpatient Medications (Respiratory):    levocetirizine (XYZAL) 5 MG tablet, Take 1 tablet (5 mg total) by mouth every evening.   loratadine (CLARITIN) 10 MG tablet, Take 10 mg by mouth daily as needed for allergies.  Current Outpatient Medications (Analgesics):    acetaminophen (TYLENOL) 325 MG tablet, Take 2 tablets (650 mg total) by mouth every 6 (six) hours as needed for moderate pain.   aspirin 81 MG chewable tablet, Chew 1 tablet (  81 mg total) by mouth 2 (two) times daily.   traMADol (ULTRAM) 50 MG tablet, Take 1 tablet (50 mg total) by mouth every 6 (six) hours as needed for moderate pain.   Current Outpatient Medications (Other):    amoxicillin (AMOXIL) 875 MG tablet, Take 1 tablet (875 mg total) by mouth 2 (two) times daily.   gabapentin (NEURONTIN) 300 MG capsule, TAKE 1 CAPSULE BY MOUTH AT BEDTIME   Ginkgo Biloba 120 MG CAPS, Take 120 mg by mouth in the morning.   hydroxypropyl methylcellulose / hypromellose (ISOPTO TEARS / GONIOVISC) 2.5 % ophthalmic solution, Place 1 drop into both eyes 4 (four) times daily as needed for dry eyes.   methocarbamol (ROBAXIN) 500 MG tablet, Take 1 tablet (500 mg total) by mouth at bedtime as needed for muscle spasms.   Multiple Vitamins-Minerals (HAIR/SKIN/NAILS/BIOTIN PO), Take 3 tablets by mouth in the morning.   ondansetron (ZOFRAN) 4 MG tablet, Take 4 mg by mouth every 6 (six) hours as needed for nausea or vomiting.   pantoprazole (PROTONIX) 20 MG tablet, TAKE 1 TABLET(20 MG) BY MOUTH DAILY   potassium chloride SA (KLOR-CON) 20 MEQ tablet, Take 1 tablet (20 mEq total) by mouth daily.   triamcinolone cream (KENALOG) 0.1 %, Apply 1 application topically 2 (two) times daily.   gabapentin (NEURONTIN) 100 MG capsule, Take 2 capsules (200 mg total) by mouth at bedtime.   Reviewed prior external information including notes and imaging from   primary care provider As well as notes that were available from care everywhere and other healthcare systems.  Past medical history, social, surgical and family history all reviewed in electronic medical record.  No pertanent information unless stated regarding to the chief complaint.   Review of Systems:  No headache, visual changes, nausea, vomiting, diarrhea, constipation, dizziness, abdominal pain, skin rash, fevers, chills, night sweats, weight loss, swollen lymph nodes, body aches, joint swelling, chest pain, shortness of breath, mood changes. POSITIVE muscle aches  Objective  Blood pressure 122/76, pulse 77, height '5\' 4"'$  (1.626 m), weight 156 lb (70.8 kg), SpO2 98 %.   General: No apparent distress alert and oriented x3 mood and affect normal, dressed appropriately.  HEENT: Pupils equal, extraocular movements intact  Respiratory: Patient's speak in full sentences and does not appear short of breath  Cardiovascular: No lower extremity edema, non tender, no erythema  Gait antalgic gait MSK: Back exam does have some overall loss of lordosis.  The patient does have some tenderness to palpation in the paraspinal musculature, does have tightness noted right greater than left. Mid back does have some tenderness in the thoracolumbar juncture as well.  Tightness noted with the FABER test left greater than right as well patient does have a leg length discrepancy noted with the right leg being shorter.  Left knee replacement overall does appear to be relatively normal except does have a very mild amount of effusion noted.  Flexion greater than 90 degrees.   [id number]; 15 additional minutes spent for Therapeutic exercises as stated in above notes.  This included exercises focusing on stretching, strengthening, with significant focus on eccentric aspects.   Long term goals include an improvement in range of motion, strength, endurance as well as avoiding reinjury. Patient's frequency would include in 1-2  times a day, 3-5 times a week for a duration of 6-12 weeks. Low back exercises that included:  Pelvic tilt/bracing instruction to focus on control of the pelvic girdle and lower abdominal muscles  Glute strengthening exercises, focusing  on proper firing of the glutes without engaging the low back muscles Proper stretching techniques for maximum relief for the hamstrings, hip flexors, low back and some rotation where tolerated  Proper technique shown and discussed handout in great detail with ATC.  All questions were discussed and answered.    Impression and Recommendations:    The above documentation has been reviewed and is accurate and complete Lyndal Pulley, DO

## 2021-04-04 ENCOUNTER — Other Ambulatory Visit: Payer: Self-pay | Admitting: Family Medicine

## 2021-04-05 ENCOUNTER — Other Ambulatory Visit: Payer: Self-pay

## 2021-04-05 ENCOUNTER — Ambulatory Visit: Payer: PPO | Admitting: Family Medicine

## 2021-04-05 ENCOUNTER — Encounter: Payer: Self-pay | Admitting: Family Medicine

## 2021-04-05 DIAGNOSIS — M545 Low back pain, unspecified: Secondary | ICD-10-CM | POA: Diagnosis not present

## 2021-04-05 DIAGNOSIS — M2170 Unequal limb length (acquired), unspecified site: Secondary | ICD-10-CM

## 2021-04-05 DIAGNOSIS — G8929 Other chronic pain: Secondary | ICD-10-CM

## 2021-04-05 MED ORDER — GABAPENTIN 100 MG PO CAPS: 200.0000 mg | ORAL_CAPSULE | Freq: Every day | ORAL | 0 refills | Status: DC

## 2021-04-05 NOTE — Patient Instructions (Signed)
Do prescribed exercises at least 3x a week ?Heel lift for right shoe ?Gabapentin '200mg'$  at night ?See you again in 5 weeks ?

## 2021-04-05 NOTE — Assessment & Plan Note (Signed)
Heel lift suggested for the right foot ?

## 2021-04-05 NOTE — Assessment & Plan Note (Addendum)
Patient is having more of a left-sided low back pain.  Patient did have x-rays showing just some moderate arthritic changes mostly at the higher levels of the lumbar spine.  Patient did have some radiation down the legs.  Patient does have a leg length discrepancy noted secondary to her knee replacement and is wearing a heel lift on the right side.  We discussed with patient about the home exercises, icing regimen, which activities to do and which ones to avoid.  Increase activity slowly.  Follow-up again in 6 to 8 weeks. ? ?Discussed with patient worsening discomfort.  Consider advanced imaging, Singulair for some mild lower leg swelling with knee replacement.  Restarted gabapentin at this time.  Home exercises given. ?

## 2021-04-09 IMAGING — CT CT ABD-PELV W/ CM
2 of 5 series · 17 of 46 positions shown, 19 images · IV contrast (Omni 300)
Comparison: None.

CLINICAL DATA: Right upper quadrant pain with nausea and vomiting

EXAM:
CT ABDOMEN AND PELVIS WITH CONTRAST
TECHNIQUE: Multidetector CT imaging of the abdomen and pelvis was performed
using the standard protocol following bolus administration of
intravenous contrast.
CONTRAST:  100mL OMNIPAQUE IOHEXOL 300 MG/ML  SOLN

[Series 3: a/p w/ 5mm · axial · 0.84mm/px · z∈[+740,+1170]mm · 14 of 96 slices shown, 16 images]
[im 5/96  soft-tissue]
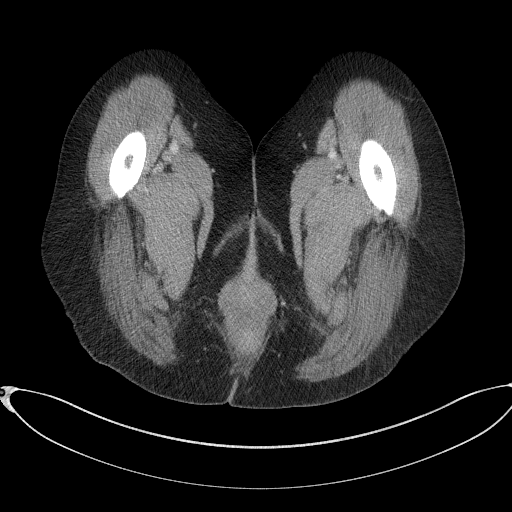
[im 5/96  bone]
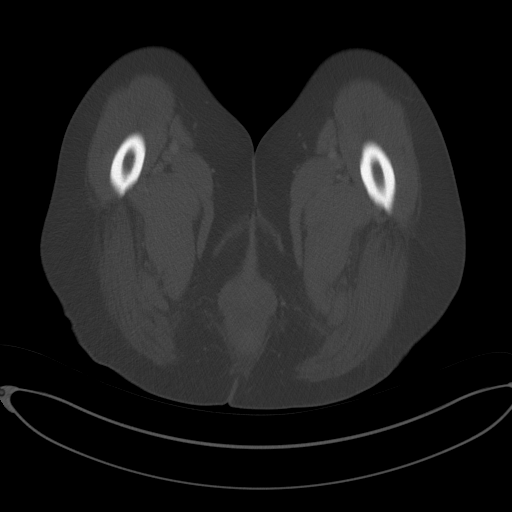
[im 15/96  soft-tissue]
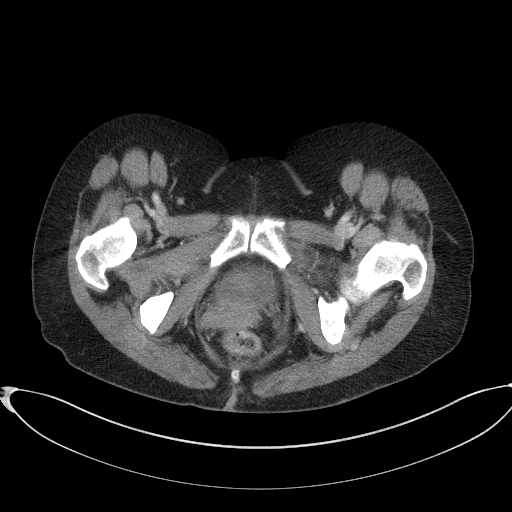
[im 20/96  soft-tissue]
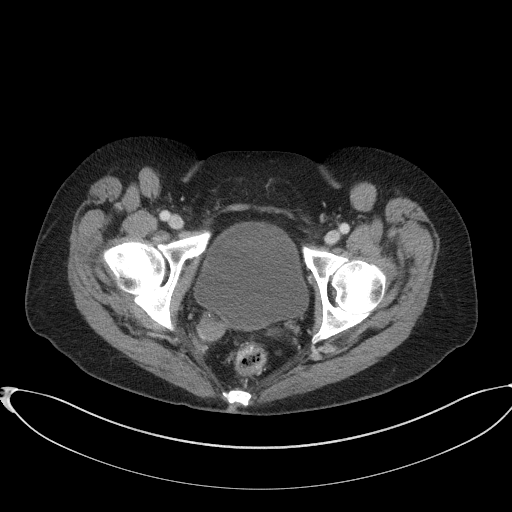
[im 24/96  soft-tissue]
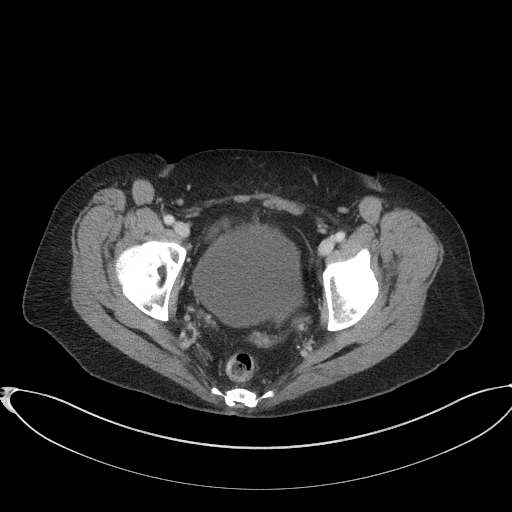
[im 34/96  soft-tissue]
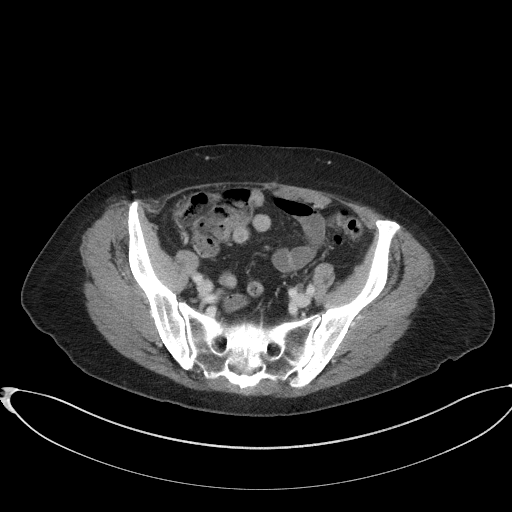
[im 39/96  soft-tissue]
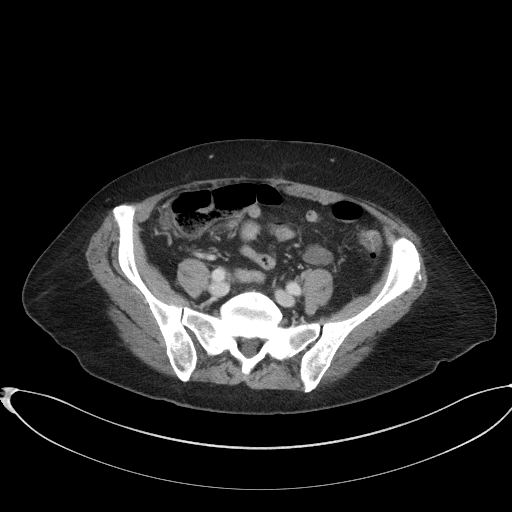
[im 43/96  soft-tissue]
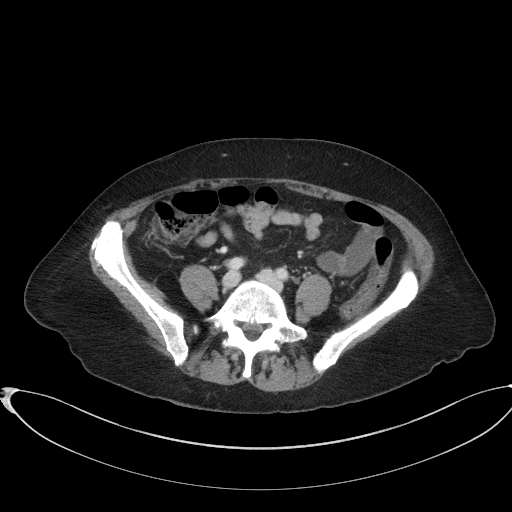
[im 53/96  soft-tissue]
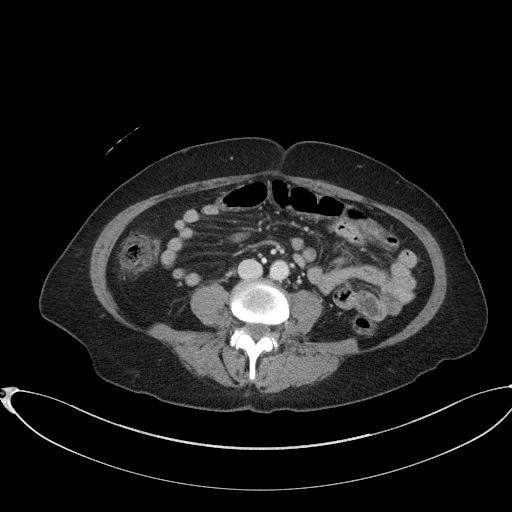
[im 58/96  soft-tissue]
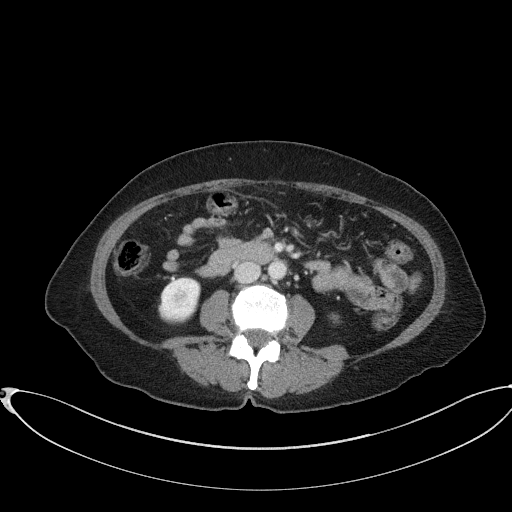
[im 58/96  bone]
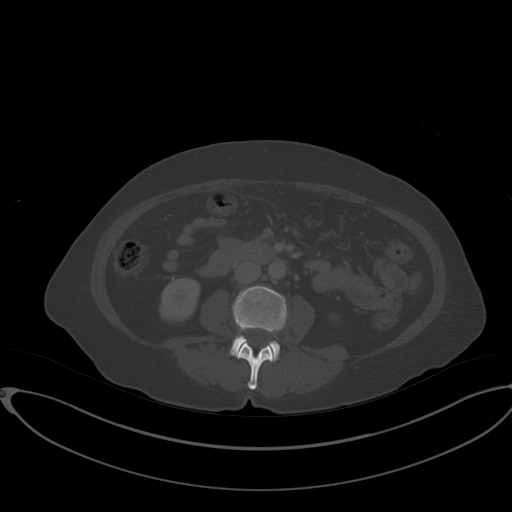
[im 62/96  soft-tissue]
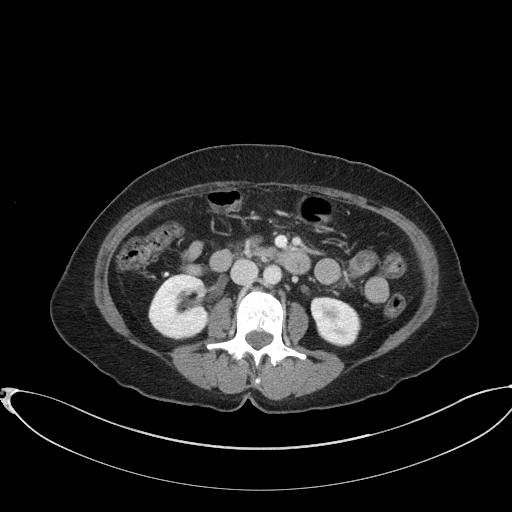
[im 72/96  soft-tissue]
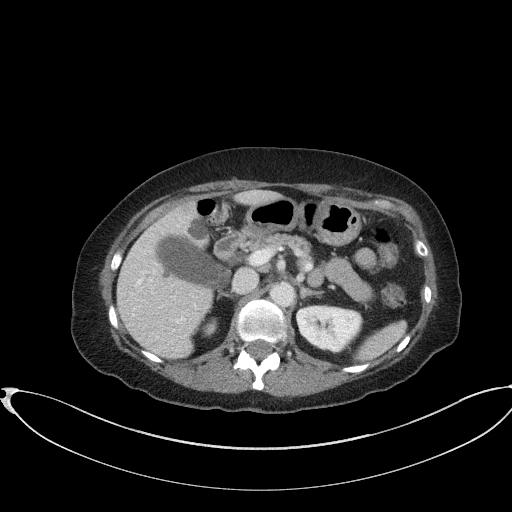
[im 77/96  soft-tissue]
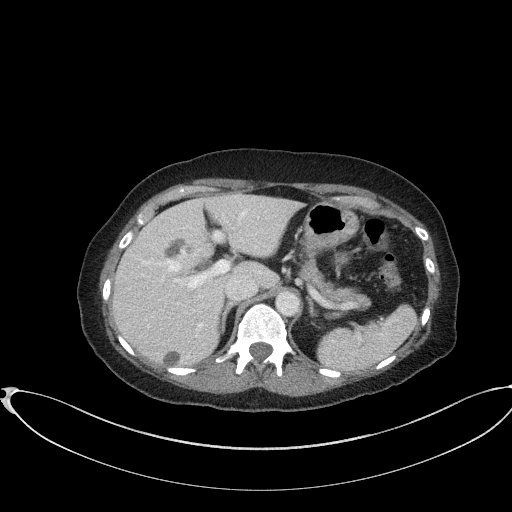
[im 81/96  soft-tissue]
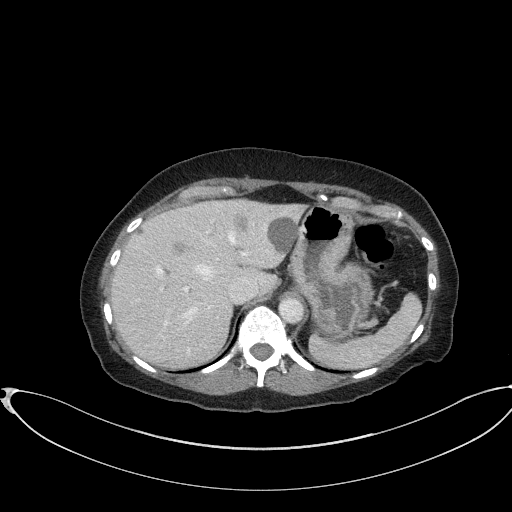
[im 91/96  soft-tissue]
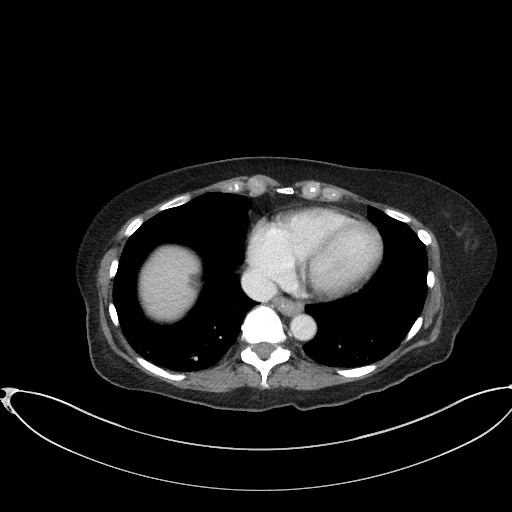

[Series 6: a/p w/ cor · coronal · 0.79mm/px · 3 of 133 slices shown]
[im 45/133  soft-tissue]
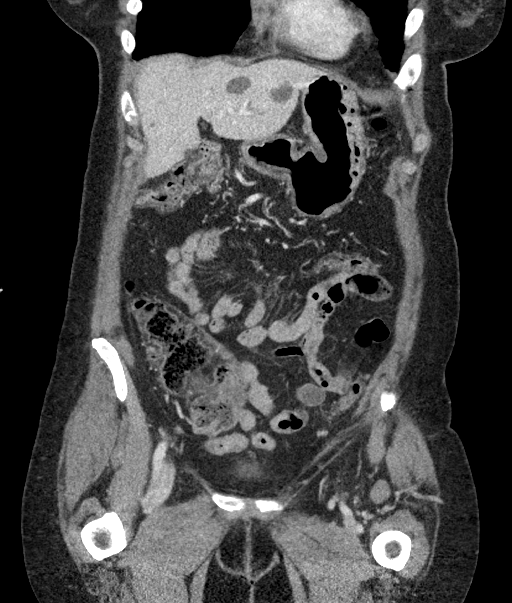
[im 59/133  soft-tissue]
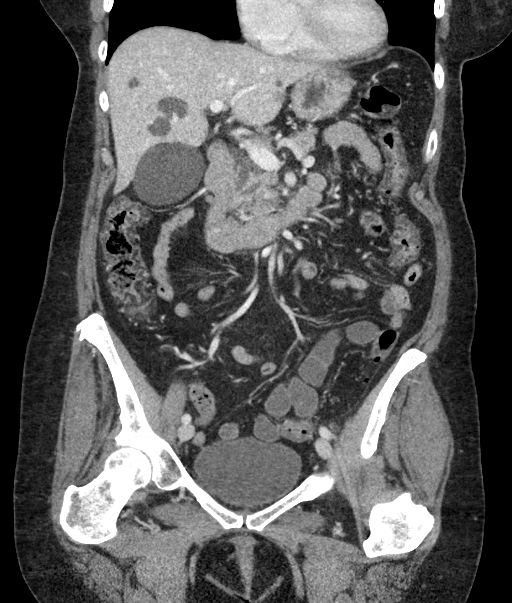
[im 74/133  soft-tissue]
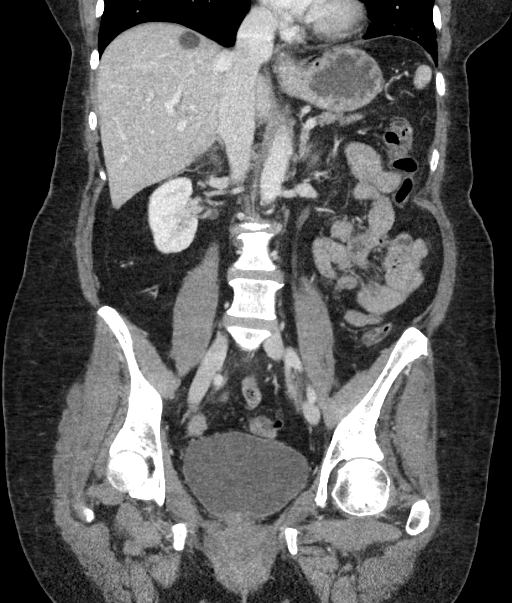

[17 of 46 positions shown; findings below may reference images not displayed]

FINDINGS: Lower chest:  No contributory findings.

Hepatobiliary: Multiple simple appearing hepatic cysts scattered
throughout the liver. The largest measures 26 mm in the central
liver.Full gallbladder with possible dependent calculi. No
surrounding stranding. No common bile duct dilatation.

Pancreas: Unremarkable.

Spleen: Unremarkable.

Adrenals/Urinary Tract: Negative adrenals. No hydronephrosis or
stone. Unremarkable bladder.

Stomach/Bowel: No obstruction. No appendicitis. Left colonic
diverticulosis.

Vascular/Lymphatic: No acute vascular abnormality. No mass or
adenopathy.

Reproductive:Hysterectomy. Asymmetry at the vaginal cuff is likely
related to scarring on the right.

Other: No ascites or pneumoperitoneum.

Musculoskeletal: No acute abnormalities.
IMPRESSION: 1. Suspect small layering calculi. The gallbladder is full but there
is no definite inflammation for acute cholecystitis.
2. Colonic diverticulosis.
3. Simple hepatic cysts.

## 2021-04-23 ENCOUNTER — Encounter: Payer: Self-pay | Admitting: Family Medicine

## 2021-04-23 ENCOUNTER — Other Ambulatory Visit: Payer: Self-pay | Admitting: Family Medicine

## 2021-04-23 ENCOUNTER — Other Ambulatory Visit: Payer: Self-pay

## 2021-04-23 DIAGNOSIS — J45901 Unspecified asthma with (acute) exacerbation: Secondary | ICD-10-CM

## 2021-04-23 MED ORDER — FLUTICASONE PROPIONATE HFA 44 MCG/ACT IN AERO: 2.0000 | INHALATION_SPRAY | Freq: Two times a day (BID) | RESPIRATORY_TRACT | 6 refills | Status: DC

## 2021-05-11 ENCOUNTER — Encounter: Payer: Self-pay | Admitting: Orthopaedic Surgery

## 2021-05-11 ENCOUNTER — Ambulatory Visit: Payer: PPO | Admitting: Orthopaedic Surgery

## 2021-05-11 DIAGNOSIS — Z96652 Presence of left artificial knee joint: Secondary | ICD-10-CM

## 2021-05-11 DIAGNOSIS — M1712 Unilateral primary osteoarthritis, left knee: Secondary | ICD-10-CM | POA: Diagnosis not present

## 2021-05-11 NOTE — Progress Notes (Signed)
? ?Office Visit Note ?  ?Patient: Megan Valentine           ?Date of Birth: Nov 22, 1950           ?MRN: [id number] ?Visit Date: 05/11/2021 ?             ?Requested by: Libby Maw, MD ?[address] ?[place],  [place] [id number] ?PCP: Libby Maw, MD ? ? ?Assessment & Plan: ?Visit Diagnoses:  ?1. Unilateral primary osteoarthritis, left knee   ?2. S/P TKR (total knee replacement) using cement, left   ? ? ?Plan: Ms. Halseth 6 months status post primary left total knee replacement and doing reasonably well.  She has had some pain in the lower tibial region her primary care physician thinks that she may have actually had shingles.  She is presently taking 300 mg of gabapentin at night and makes a difference.  She occasionally has some discomfort in her knee and popping clicking.  She went back to work in January and sits most of the day but is able to get up and down.  Her knee exam was quite benign.  She had full quick extension of flex to 105 degrees.  There was no opening with a varus or valgus stress.  No effusion in the knee was not warm.  There were no localized areas of tenderness.  She also has been seeing her primary care physician for some back problems and is doing exercises and trying over-the-counter medicines.  I have encouraged her to work as some strengthening exercises I think will make a huge difference in terms of her knee and like to see her back in 6 months ? ?Follow-Up Instructions: Return in about 6 months (around 11/10/2021).  ? ?Orders:  ?No orders of the defined types were placed in this encounter. ? ?No orders of the defined types were placed in this encounter. ? ? ? ? Procedures: ?No procedures performed ? ? ?Clinical Data: ?No additional findings. ? ? ?Subjective: ?Chief Complaint  ?Patient presents with  ? Left Knee - Follow-up  ?  Left total knee arthroplasty 11/17/2020  ?Patient presents today for a three month follow up on her left knee. She is now 6 months out from  a left total knee arthroplasty. Her surgery was on 11/17/2020. She said that it feels like a band is wrapped around her knee. She is doing well otherwise except for some pain in her lower leg. She takes Gabapentin '300mg'$  nightly to help with this.  ?Has recently seen her family physician thinks that she may have had shingles which is caused the pain in her lower legs.  She is on gabapentin which helps.  She never had any skin eruptions.  She is also being followed by her primary care physician for issues with her neck and her back for "degenerative disc disease".  She is taking over-the-counter medicines and doing exercises.  She is not having any obvious radiculopathy ? ?HPI ? ?Review of Systems ? ? ?Objective: ?Vital Signs: There were no vitals taken for this visit. ? ?Physical Exam ?Constitutional:   ?   Appearance: She is well-developed.  ?Eyes:  ?   Pupils: Pupils are equal, round, and reactive to light.  ?Pulmonary:  ?   Effort: Pulmonary effort is normal.  ?Skin: ?   General: Skin is warm and dry.  ?Neurological:  ?   Mental Status: She is alert and oriented to person, place, and time.  ?Psychiatric:     ?  Behavior: Behavior normal.  ? ? ?Ortho Exam awake alert and oriented x3.  Comfortable sitting.  No use of ambulatory aid.  Does have some areas of local tenderness along the left distal leg and ankle but skin looks fine there is no erythema or ecchymosis or edema.  Motor exam is intact.  Good sensation.  Full quick extension of the left knee and flex to 105 degrees.  There is just a little opening with a varus and valgus stress but no localized areas of tenderness.  No effusion.  No popliteal mass.  No calf pain.  Straight leg raise negative.  Not have good tone in her quad or hamstring musculature ? ?Specialty Comments:  ?No specialty comments available. ? ?Imaging: ?No results found. ? ? ?PMFS History: ?Patient Active Problem List  ? Diagnosis Date Noted  ? Low back pain 04/05/2021  ? Acquired leg length  discrepancy 04/05/2021  ? Urinary urgency 01/14/2021  ? Acute cystitis without hematuria 01/14/2021  ? Macrocytosis 12/07/2020  ? S/P TKR (total knee replacement) using cement, left 11/17/2020  ? Chronic pain of right knee 09/30/2020  ? Medication side effect 09/30/2020  ? Statin intolerance 06/30/2020  ? Trigger point of shoulder region, left 02/25/2020  ? Elevated glucose 08/15/2019  ? Anemia 02/15/2019  ? Abdominal pain 12/14/2018  ? Sinus pressure 11/12/2018  ? History of 2019 novel coronavirus disease (COVID-19) 11/12/2018  ? Pleurisy 06/05/2018  ? Enterocele 06/05/2018  ? Rectocele 05/23/2018  ? Vaginal vault prolapse 05/23/2018  ? SUI (stress urinary incontinence, female) 05/23/2018  ? Asthma 05/07/2018  ? Stage 3a chronic kidney disease ([place]) 01/15/2018  ? Edema 12/15/2017  ? Insomnia 11/20/2017  ? Seborrheic dermatitis 11/16/2017  ? Seasonal allergic rhinitis due to pollen 11/16/2017  ? Menopausal and female climacteric states 10/19/2017  ? Constipation 03/26/2015  ? Unilateral primary osteoarthritis, left knee 11/17/2014  ? Healthcare maintenance 04/25/2014  ? Hypokalemia 09/15/2011  ? Hypothyroidism 08/21/2008  ? Mixed hyperlipidemia 08/21/2008  ? ?Past Medical History:  ?Diagnosis Date  ? Allergy   ? Arthritis   ? Asthma   ? Cataract   ? Chronic bronchitis ([place])   ? Chronic kidney disease   ? stage 3, kidney infections  ? Complication of anesthesia   ? hard to wake up  ? Diverticulosis   ? Environmental allergies   ? GERD (gastroesophageal reflux disease)   ? Hiatal hernia   ? History of chicken pox   ? Hyperlipidemia   ? Hypothyroidism   ? IBS (irritable bowel syndrome)   ? Migraines   ? Mitral valve prolapse   ? Per pt . per Dr.Henry  [name].  does not have MVP  ? Neuromuscular disorder ([place])   ? neuropathy feet  ? Pneumonia   ? Rectal prolapse   ?  ?Family History  ?Problem Relation Age of Onset  ? Stomach cancer Maternal Grandmother   ? Hypertension Maternal Grandmother   ? Diabetes Maternal  Grandmother   ? Stroke Maternal Grandmother   ? Cancer Maternal Grandmother   ?     Stomach cancer  ? Colon cancer Paternal Grandfather   ? Heart attack Paternal Grandfather   ? Hyperlipidemia Mother   ? Diabetes Mother   ? Hypertension Mother   ? Alzheimer's disease Mother   ? Diabetes Father   ? Hyperlipidemia Father   ? Heart disease Father   ? Heart attack Father   ? Stroke Paternal Grandmother   ?  ?Past Surgical History:  ?  Procedure Laterality Date  ? ABDOMINAL HYSTERECTOMY  1999  ? APPENDECTOMY  1962  ? BREAST CYST ASPIRATION Bilateral   ? CHOLECYSTECTOMY N/A 12/17/2018  ? Procedure: LAPAROSCOPIC CHOLECYSTECTOMY;  Surgeon: Clovis Riley, MD;  Location: [facility];  Service: General;  Laterality: N/A;  ? EYE SURGERY    ? cataract  ? JOINT REPLACEMENT    ? rt knee scope  ? REFRACTIVE SURGERY  2000  ? both eyes  ? retrocele repair    ? and bladder sling  ? TOTAL KNEE ARTHROPLASTY Right 03/24/2015  ? Procedure: TOTAL KNEE ARTHROPLASTY;  Surgeon: [name], MD;  Location: [facility];  Service: Orthopedics;  Laterality: Right;  ? TOTAL KNEE ARTHROPLASTY Left 11/17/2020  ? Procedure: LEFT TOTAL KNEE ARTHROPLASTY;  Surgeon: [name], MD;  Location: [facility];  Service: Orthopedics;  Laterality: Left;  ? TUBAL LIGATION  1977  ? ?Social History  ? ?Occupational History  ? 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  Employer: [employer]  ?Tobacco Use  ? Smoking status: Former  ?  Types: Cigarettes  ?  Quit date: 03/23/1976  ?  Years since quitting: 45.1  ? Smokeless tobacco: Never  ?Vaping Use  ? Vaping Use: Never used  ?Substance and Sexual Activity  ? Alcohol use: No  ? Drug use: No  ? Sexual activity: Yes  ?  Birth control/protection: None, Post-menopausal  ? ? ? ? ? ? ?

## 2021-05-13 ENCOUNTER — Ambulatory Visit (INDEPENDENT_AMBULATORY_CARE_PROVIDER_SITE_OTHER): Payer: PPO | Admitting: Family Medicine

## 2021-05-13 ENCOUNTER — Encounter: Payer: Self-pay | Admitting: Family Medicine

## 2021-05-13 VITALS — BP 118/70 | HR 70 | Temp 97.6°F | Ht 64.0 in | Wt 159.0 lb

## 2021-05-13 DIAGNOSIS — E039 Hypothyroidism, unspecified: Secondary | ICD-10-CM

## 2021-05-13 DIAGNOSIS — I872 Venous insufficiency (chronic) (peripheral): Secondary | ICD-10-CM | POA: Diagnosis not present

## 2021-05-13 DIAGNOSIS — G629 Polyneuropathy, unspecified: Secondary | ICD-10-CM

## 2021-05-13 DIAGNOSIS — E876 Hypokalemia: Secondary | ICD-10-CM | POA: Diagnosis not present

## 2021-05-13 MED ORDER — GABAPENTIN 300 MG PO CAPS: ORAL_CAPSULE | ORAL | 3 refills | Status: DC

## 2021-05-13 NOTE — Progress Notes (Addendum)
? ?Established Patient Office Visit ? ?Subjective   ?Patient ID: Megan Valentine, female    DOB: September 04, 1950  Age: 71 y.o. MRN: [id number] ? ?Chief Complaint  ?Patient presents with  ? Edema  ?  Lots of swelling x 1 week becoming worse.   ? ? ?HPI for subjective swelling in her lower extremities hands and facial area.  She reports burning dysesthesias of her left medial leg.  Left total knee replacement in October of last year.  She has no history of rash.  She has had both of her Shingrix vaccines.  She has taken Lasix recently and lost 5 pounds.  I discontinued Lasix with secondary to hypokalemia. ? ? ? ?Review of Systems  ?Constitutional:  Negative for chills, diaphoresis, malaise/fatigue and weight loss.  ?HENT: Negative.    ?Eyes: Negative.  Negative for blurred vision and double vision.  ?Cardiovascular:  Negative for chest pain.  ?Gastrointestinal:  Negative for abdominal pain.  ?Genitourinary: Negative.   ?Musculoskeletal:  Positive for joint pain. Negative for falls and myalgias.  ?Neurological:  Negative for speech change, loss of consciousness and weakness.  ?Psychiatric/Behavioral: Negative.    ? ?  ?Objective:  ?  ? ?BP 118/70 (BP Location: Right Arm, Patient Position: Sitting, Cuff Size: Large)   Pulse 70   Temp 97.6 ?F (36.4 ?C) (Temporal)   Ht '5\' 4"'$  (1.626 m)   Wt 159 lb (72.1 kg)   SpO2 98%   BMI 27.29 kg/m?  ? ? ?Physical Exam ?Constitutional:   ?   General: She is not in acute distress. ?   Appearance: Normal appearance. She is not ill-appearing, toxic-appearing or diaphoretic.  ?HENT:  ?   Head: Normocephalic and atraumatic.  ?   Right Ear: External ear normal.  ?   Left Ear: External ear normal.  ?Eyes:  ?   General: No scleral icterus.    ?   Right eye: No discharge.     ?   Left eye: No discharge.  ?   Extraocular Movements: Extraocular movements intact.  ?   Conjunctiva/sclera: Conjunctivae normal.  ?Cardiovascular:  ?   Rate and Rhythm: Normal rate and regular rhythm.  ?Pulmonary:  ?    Effort: Pulmonary effort is normal. No respiratory distress.  ?   Breath sounds: Normal breath sounds.  ?Musculoskeletal:  ?   Cervical back: No rigidity or tenderness.  ?   Right lower leg: No edema.  ?   Left lower leg: No edema.  ?Skin: ?   General: Skin is warm and dry.  ?Neurological:  ?   Mental Status: She is alert and oriented to person, place, and time.  ?Psychiatric:     ?   Mood and Affect: Mood normal.     ?   Behavior: Behavior normal.  ? ? ? ?Results for orders placed or performed in visit on 05/13/21  ?TSH  ?Result Value Ref Range  ? TSH 2.67 0.35 - 5.50 uIU/mL  ?Comprehensive metabolic panel  ?Result Value Ref Range  ? Sodium 141 135 - 145 mEq/L  ? Potassium 3.3 (L) 3.5 - 5.1 mEq/L  ? Chloride 108 96 - 112 mEq/L  ? CO2 22 19 - 32 mEq/L  ? Glucose, Bld 105 (H) 70 - 99 mg/dL  ? BUN 19 6 - 23 mg/dL  ? Creatinine, Ser 0.88 0.40 - 1.20 mg/dL  ? Total Bilirubin 0.3 0.2 - 1.2 mg/dL  ? Alkaline Phosphatase 65 39 - 117 U/L  ? AST 18  0 - 37 U/L  ? ALT 12 0 - 35 U/L  ? Total Protein 6.8 6.0 - 8.3 g/dL  ? Albumin 4.1 3.5 - 5.2 g/dL  ? GFR 66.24 >60.00 mL/min  ? Calcium 8.8 8.4 - 10.5 mg/dL  ? ? ? ? ?The 10-year ASCVD risk score (Arnett DK, et al., 2019) is: 7.1% ? ?  ?Assessment & Plan:  ? ?Problem List Items Addressed This Visit   ? ?  ? Cardiovascular and Mediastinum  ? Venous insufficiency  ?  ? Endocrine  ? Hypothyroidism  ? Relevant Medications  ? levothyroxine (SYNTHROID) 112 MCG tablet  ? Other Relevant Orders  ? TSH (Completed)  ?  ? Nervous and Auditory  ? Neuropathy  ? Relevant Medications  ? gabapentin (NEURONTIN) 300 MG capsule  ?  ? Other  ? Hypokalemia - Primary  ? Relevant Medications  ? potassium chloride SA (KLOR-CON M) 20 MEQ tablet  ? Other Relevant Orders  ? Comprehensive metabolic panel (Completed)  ? ? ?Return in about 4 weeks (around 06/10/2021).  ? ?Recommended compression for the swelling in her lower extremities.  Again avoiding Lasix secondary to associated hypokalemia.  Neuropathy  persisting.  Will increase gabapentin to 300 mg 3 times daily and possibly further.  TSH is high normal at last check.  It was high normal.  We have room to increase her levothyroxine.  This may help pending TSH results. ?Libby Maw, MD ? ?

## 2021-05-14 ENCOUNTER — Telehealth: Payer: Self-pay | Admitting: Family Medicine

## 2021-05-14 ENCOUNTER — Other Ambulatory Visit: Payer: Self-pay | Admitting: Family Medicine

## 2021-05-14 LAB — COMPREHENSIVE METABOLIC PANEL
ALT: 12 U/L (ref 0–35)
AST: 18 U/L (ref 0–37)
Albumin: 4.1 g/dL (ref 3.5–5.2)
Alkaline Phosphatase: 65 U/L (ref 39–117)
BUN: 19 mg/dL (ref 6–23)
CO2: 22 mEq/L (ref 19–32)
Calcium: 8.8 mg/dL (ref 8.4–10.5)
Chloride: 108 mEq/L (ref 96–112)
Creatinine, Ser: 0.88 mg/dL (ref 0.40–1.20)
GFR: 66.24 mL/min (ref >60.00)
Glucose, Bld: 105 mg/dL — ABNORMAL HIGH (ref 70–99)
Potassium: 3.3 mEq/L — ABNORMAL LOW (ref 3.5–5.1)
Sodium: 141 mEq/L (ref 135–145)
Total Bilirubin: 0.3 mg/dL (ref 0.2–1.2)
Total Protein: 6.8 g/dL (ref 6.0–8.3)

## 2021-05-14 LAB — TSH: TSH: 2.67 u[IU]/mL (ref 0.35–5.50)

## 2021-05-14 MED ORDER — ESTRADIOL 0.5 MG PO TABS: 0.5000 mg | ORAL_TABLET | Freq: Every day | ORAL | 0 refills | Status: DC

## 2021-05-14 NOTE — Progress Notes (Signed)
?Megan Valentine D.O. ?[facility] ?[place] ?Phone: [phone] ?Subjective:   ?I, Megan Valentine, am serving as a scribe for Dr. Hulan Saas. ? ?This visit occurred during the SARS-CoV-2 public health emergency.  Safety protocols were in place, including screening questions prior to the visit, additional usage of staff PPE, and extensive cleaning of exam room while observing appropriate contact time as indicated for disinfecting solutions.  ? ? ?I'm seeing this patient by the request  of:  Libby Maw, MD ? ?CC: Leg pain, leg length discrepancy, back pain ? ?EYC:[id number]  ?04/05/2021 ?Heel lift suggested for the right foot ? ?Patient is having more of a left-sided low back pain.  Patient did have x-rays showing just some moderate arthritic changes mostly at the higher levels of the lumbar spine.  Patient did have some radiation down the legs.  Patient does have a leg length discrepancy noted secondary to her knee replacement and is wearing a heel lift on the right side.  We discussed with patient about the home exercises, icing regimen, which activities to do and which ones to avoid.  Increase activity slowly.  Follow-up again in 6 to 8 weeks. ?  ?Discussed with patient worsening discomfort.  Consider advanced imaging, Singulair for some mild lower leg swelling with knee replacement.  Restarted gabapentin at this time.  Home exercises given. ? ?Update 05/17/2021 ?Megan Valentine is a 71 y.o. female coming in with complaint of back pain. Started on gabapentin last visit. Given heel lifts. Patient states that she saw PCP and was put on '300mg'$  gabapentin TID. Pain is now improving since taking new dose. Patient is using heel lift on R side and wants to know if this is right size. Also using compression stockings now that rise up to the thigh instead of taking Lasix.  Patient states that she is significantly better at this time.  Doing the exercises but certain ones are  related to repetitive activity causing her discomfort. ? ? ? ?  ? ?Past Medical History:  ?Diagnosis Date  ? Allergy   ? Arthritis   ? Asthma   ? Cataract   ? Chronic bronchitis ([place])   ? Chronic kidney disease   ? stage 3, kidney infections  ? Complication of anesthesia   ? hard to wake up  ? Diverticulosis   ? Environmental allergies   ? GERD (gastroesophageal reflux disease)   ? Hiatal hernia   ? History of chicken pox   ? Hyperlipidemia   ? Hypothyroidism   ? IBS (irritable bowel syndrome)   ? Migraines   ? Mitral valve prolapse   ? Per pt . per Dr.Henry  [name].  does not have MVP  ? Neuromuscular disorder ([place])   ? neuropathy feet  ? Pneumonia   ? Rectal prolapse   ? ?Past Surgical History:  ?Procedure Laterality Date  ? ABDOMINAL HYSTERECTOMY  1999  ? APPENDECTOMY  1962  ? BREAST CYST ASPIRATION Bilateral   ? CHOLECYSTECTOMY N/A 12/17/2018  ? Procedure: LAPAROSCOPIC CHOLECYSTECTOMY;  Surgeon: Clovis Riley, MD;  Location: [facility];  Service: General;  Laterality: N/A;  ? EYE SURGERY    ? cataract  ? JOINT REPLACEMENT    ? rt knee scope  ? REFRACTIVE SURGERY  2000  ? both eyes  ? retrocele repair    ? and bladder sling  ? TOTAL KNEE ARTHROPLASTY Right 03/24/2015  ? Procedure: TOTAL KNEE ARTHROPLASTY;  Surgeon: [name],  MD;  Location: [facility];  Service: Orthopedics;  Laterality: Right;  ? TOTAL KNEE ARTHROPLASTY Left 11/17/2020  ? Procedure: LEFT TOTAL KNEE ARTHROPLASTY;  Surgeon: [name], MD;  Location: [facility];  Service: Orthopedics;  Laterality: Left;  ? TUBAL LIGATION  1977  ? ?Social History  ? ?Socioeconomic History  ? Marital status: Widowed  ?  Spouse name: Not on file  ? Number of children: Not on file  ? Years of education: 13  ? Highest education level: Not on file  ?Occupational History  ? 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  Employer: [employer]  ?Tobacco Use  ? Smoking status: Former  ?  Types: Cigarettes  ?  Quit date: 03/23/1976  ?  Years since quitting:  45.1  ? Smokeless tobacco: Never  ?Vaping Use  ? Vaping Use: Never used  ?Substance and Sexual Activity  ? Alcohol use: No  ? Drug use: No  ? Sexual activity: Yes  ?  Birth control/protection: None, Post-menopausal  ?Other Topics Concern  ? Not on file  ?Social History Narrative  ? Caffeine Use-yes  ? Regular exercise-no  ? Right handed   ? Lives alone  ? ?Social Determinants of Health  ? ?Financial Resource Strain: Low Risk   ? Difficulty of Paying Living Expenses: Not hard at all  ?Food Insecurity: No Food Insecurity  ? Worried About Charity fundraiser in the Last Year: Never true  ? Ran Out of Food in the Last Year: Never true  ?Transportation Needs: No Transportation Needs  ? Lack of Transportation (Medical): No  ? Lack of Transportation (Non-Medical): No  ?Physical Activity: Insufficiently Active  ? Days of Exercise per Week: 3 days  ? Minutes of Exercise per Session: 20 min  ?Stress: No Stress Concern Present  ? Feeling of Stress : Not at all  ?Social Connections: Moderately Isolated  ? Frequency of Communication with Friends and Family: Twice a week  ? Frequency of Social Gatherings with Friends and Family: Twice a week  ? Attends Religious Services: More than 4 times per year  ? Active Member of Clubs or Organizations: No  ? Attends Archivist Meetings: Never  ? Marital Status: Widowed  ? ?Allergies  ?Allergen Reactions  ? Tetracycline Hives and Itching  ? Dilaudid [Hydromorphone Hcl] Nausea And Vomiting  ? Other   ?  Cigarette smoke and perfumes--flares asthma (develops bronchitis)  ? ?Family History  ?Problem Relation Age of Onset  ? Stomach cancer Maternal Grandmother   ? Hypertension Maternal Grandmother   ? Diabetes Maternal Grandmother   ? Stroke Maternal Grandmother   ? Cancer Maternal Grandmother   ?     Stomach cancer  ? Colon cancer Paternal Grandfather   ? Heart attack Paternal Grandfather   ? Hyperlipidemia Mother   ? Diabetes Mother   ? Hypertension Mother   ? Alzheimer's disease  Mother   ? Diabetes Father   ? Hyperlipidemia Father   ? Heart disease Father   ? Heart attack Father   ? Stroke Paternal Grandmother   ? ? ?Current Outpatient Medications (Endocrine & Metabolic):  ?  estradiol (ESTRACE) 0.5 MG tablet, Take 1 tablet (0.5 mg total) by mouth daily. ?  levothyroxine (SYNTHROID) 112 MCG tablet, Take 1 tablet (112 mcg total) by mouth daily before breakfast. ? ? ?Current Outpatient Medications (Respiratory):  ?  fluticasone (FLOVENT HFA) 44 MCG/ACT inhaler, Inhale 2 puffs into the lungs 2 (two)  times daily. ?  levocetirizine (XYZAL) 5 MG tablet, Take 1 tablet (5 mg total) by mouth every evening. ? ?Current Outpatient Medications (Analgesics):  ?  acetaminophen (TYLENOL) 325 MG tablet, Take 2 tablets (650 mg total) by mouth every 6 (six) hours as needed for moderate pain. ?  aspirin 81 MG chewable tablet, Chew 1 tablet (81 mg total) by mouth 2 (two) times daily. ?  traMADol (ULTRAM) 50 MG tablet, Take 1 tablet (50 mg total) by mouth every 6 (six) hours as needed for moderate pain. ? ? ?Current Outpatient Medications (Other):  ?  gabapentin (NEURONTIN) 300 MG capsule, Take 1 capsule (300 mg total) by mouth 2 (two) times daily for 7 days, THEN 1 capsule (300 mg total) 3 (three) times daily for 23 days. ?  hydroxypropyl methylcellulose / hypromellose (ISOPTO TEARS / GONIOVISC) 2.5 % ophthalmic solution, Place 1 drop into both eyes 4 (four) times daily as needed for dry eyes. ?  methocarbamol (ROBAXIN) 500 MG tablet, Take 1 tablet (500 mg total) by mouth at bedtime as needed for muscle spasms. ?  Multiple Vitamins-Minerals (HAIR/SKIN/NAILS/BIOTIN PO), Take 3 tablets by mouth in the morning. ?  pantoprazole (PROTONIX) 20 MG tablet, TAKE 1 TABLET(20 MG) BY MOUTH DAILY ?  potassium chloride SA (KLOR-CON) 20 MEQ tablet, Take 1 tablet (20 mEq total) by mouth daily. ?  triamcinolone cream (KENALOG) 0.1 %, Apply 1 application topically 2 (two) times daily. ? ? ? ?Review of Systems: ? No headache,  visual changes, nausea, vomiting, diarrhea, constipation, dizziness, abdominal pain, skin rash, fevers, chills, night sweats, weight loss, swollen lymph nodes, body aches, joint swelling, chest pain, shortnes

## 2021-05-14 NOTE — Telephone Encounter (Signed)
Pt is wondering why her estradiol (ESTRACE) 0.5 MG tablet [[id number]] is for 30 days and not her usual 90 days. Please advise pt at [phone]. ?

## 2021-05-16 ENCOUNTER — Encounter: Payer: Self-pay | Admitting: Family Medicine

## 2021-05-17 ENCOUNTER — Ambulatory Visit: Payer: PPO | Admitting: Family Medicine

## 2021-05-17 ENCOUNTER — Encounter: Payer: Self-pay | Admitting: Family Medicine

## 2021-05-17 DIAGNOSIS — G8929 Other chronic pain: Secondary | ICD-10-CM | POA: Diagnosis not present

## 2021-05-17 DIAGNOSIS — M545 Low back pain, unspecified: Secondary | ICD-10-CM | POA: Diagnosis not present

## 2021-05-17 DIAGNOSIS — M2170 Unequal limb length (acquired), unspecified site: Secondary | ICD-10-CM | POA: Diagnosis not present

## 2021-05-17 MED ORDER — POTASSIUM CHLORIDE CRYS ER 20 MEQ PO TBCR: 20.0000 meq | EXTENDED_RELEASE_TABLET | Freq: Every day | ORAL | 3 refills | Status: DC

## 2021-05-17 MED ORDER — LEVOTHYROXINE SODIUM 112 MCG PO TABS: 112.0000 ug | ORAL_TABLET | Freq: Every day | ORAL | 3 refills | Status: DC

## 2021-05-17 NOTE — Patient Instructions (Signed)
[place]: [phone] ?Continue gabapentin ?Find out frequency you need daily can play with gabapentin ?I will touch base with Dr. Ethelene Hal  ?See you in 3 months ?

## 2021-05-17 NOTE — Assessment & Plan Note (Addendum)
Patient is doing much better at this time: No significant changes.  I do feel that the leg length discrepancy has been beneficial.  Discussed home exercises and icing regimen.  Follow-up again in 12 weeks ?

## 2021-05-17 NOTE — Addendum Note (Signed)
Addended by: Jon Billings on: 05/17/2021 05:12 PM ? ? Modules accepted: Orders ? ?

## 2021-05-17 NOTE — Assessment & Plan Note (Signed)
Continue with usual meds.  Follow-up again in 3 months. ?

## 2021-05-19 ENCOUNTER — Encounter: Payer: Self-pay | Admitting: Emergency Medicine

## 2021-05-19 ENCOUNTER — Ambulatory Visit
Admission: EM | Admit: 2021-05-19 | Discharge: 2021-05-19 | Disposition: A | Discharge status: Home / Self Care | Payer: PPO | Attending: Emergency Medicine | Admitting: Emergency Medicine

## 2021-05-19 DIAGNOSIS — J029 Acute pharyngitis, unspecified: Secondary | ICD-10-CM

## 2021-05-19 DIAGNOSIS — J302 Other seasonal allergic rhinitis: Secondary | ICD-10-CM | POA: Diagnosis not present

## 2021-05-19 DIAGNOSIS — H9201 Otalgia, right ear: Secondary | ICD-10-CM | POA: Diagnosis not present

## 2021-05-19 LAB — POCT RAPID STREP A (OFFICE): Rapid Strep A Screen: NEGATIVE

## 2021-05-19 NOTE — ED Triage Notes (Signed)
Pt presents with right ear pain x 3 days and a ST since last night.  ?

## 2021-05-19 NOTE — ED Provider Notes (Signed)
?[facility] ? ? ? ?CSN: [id number] ?Arrival date & time: 05/19/21  9937 ? ? ?  ? ?History   ?Chief Complaint ?Chief Complaint  ?Patient presents with  ? Sore Throat  ? Otalgia  ? ? ?HPI ?Megan Valentine is a 71 y.o. female.  Patient presents with right ear pain and sore throat since last night.  She has postnasal drip due to allergies.  No fever, rash, cough, shortness of breath, vomiting, diarrhea, or other symptoms.  Treatment at home with salt water gargles.  No OTC medications taken.  Her medical history includes seasonal allergies, asthma, chronic bronchitis, arthritis, hypothyroidism, hyperlipidemia, CKD, GERD, IBS, venous insufficiency, neuropathy, migraine headaches. ? ?The history is provided by the patient and medical records.  ? ?Past Medical History:  ?Diagnosis Date  ? Allergy   ? Arthritis   ? Asthma   ? Cataract   ? Chronic bronchitis ([place])   ? Chronic kidney disease   ? stage 3, kidney infections  ? Complication of anesthesia   ? hard to wake up  ? Diverticulosis   ? Environmental allergies   ? GERD (gastroesophageal reflux disease)   ? Hiatal hernia   ? History of chicken pox   ? Hyperlipidemia   ? Hypothyroidism   ? IBS (irritable bowel syndrome)   ? Migraines   ? Mitral valve prolapse   ? Per pt . per Dr.Henry  [name].  does not have MVP  ? Neuromuscular disorder ([place])   ? neuropathy feet  ? Pneumonia   ? Rectal prolapse   ? ? ?Patient Active Problem List  ? Diagnosis Date Noted  ? Neuropathy 05/13/2021  ? Venous insufficiency 05/13/2021  ? Low back pain 04/05/2021  ? Acquired leg length discrepancy 04/05/2021  ? Urinary urgency 01/14/2021  ? Acute cystitis without hematuria 01/14/2021  ? Macrocytosis 12/07/2020  ? S/P TKR (total knee replacement) using cement, left 11/17/2020  ? Chronic pain of right knee 09/30/2020  ? Medication side effect 09/30/2020  ? Statin intolerance 06/30/2020  ? Trigger point of shoulder region, left 02/25/2020  ? Elevated glucose 08/15/2019  ? Anemia 02/15/2019   ? Abdominal pain 12/14/2018  ? Sinus pressure 11/12/2018  ? History of 2019 novel coronavirus disease (COVID-19) 11/12/2018  ? Pleurisy 06/05/2018  ? Enterocele 06/05/2018  ? Rectocele 05/23/2018  ? Vaginal vault prolapse 05/23/2018  ? SUI (stress urinary incontinence, female) 05/23/2018  ? Asthma 05/07/2018  ? Stage 3a chronic kidney disease ([place]) 01/15/2018  ? Edema 12/15/2017  ? Insomnia 11/20/2017  ? Seborrheic dermatitis 11/16/2017  ? Seasonal allergic rhinitis due to pollen 11/16/2017  ? Menopausal and female climacteric states 10/19/2017  ? Constipation 03/26/2015  ? Unilateral primary osteoarthritis, left knee 11/17/2014  ? Healthcare maintenance 04/25/2014  ? Hypokalemia 09/15/2011  ? Hypothyroidism 08/21/2008  ? Mixed hyperlipidemia 08/21/2008  ? ? ?Past Surgical History:  ?Procedure Laterality Date  ? ABDOMINAL HYSTERECTOMY  1999  ? APPENDECTOMY  1962  ? BREAST CYST ASPIRATION Bilateral   ? CHOLECYSTECTOMY N/A 12/17/2018  ? Procedure: LAPAROSCOPIC CHOLECYSTECTOMY;  Surgeon: Clovis Riley, MD;  Location: [facility];  Service: General;  Laterality: N/A;  ? EYE SURGERY    ? cataract  ? JOINT REPLACEMENT    ? rt knee scope  ? REFRACTIVE SURGERY  2000  ? both eyes  ? retrocele repair    ? and bladder sling  ? TOTAL KNEE ARTHROPLASTY Right 03/24/2015  ? Procedure: TOTAL KNEE ARTHROPLASTY;  Surgeon: [name],  MD;  Location: [facility];  Service: Orthopedics;  Laterality: Right;  ? TOTAL KNEE ARTHROPLASTY Left 11/17/2020  ? Procedure: LEFT TOTAL KNEE ARTHROPLASTY;  Surgeon: [name], MD;  Location: [facility];  Service: Orthopedics;  Laterality: Left;  ? TUBAL LIGATION  1977  ? ? ?OB History   ?No obstetric history on file. ?  ? ? ? ?Home Medications   ? ?Prior to Admission medications   ?Medication Sig Start Date End Date Taking? Authorizing Provider  ?acetaminophen (TYLENOL) 325 MG tablet Take 2 tablets (650 mg total) by mouth every 6 (six) hours as needed for moderate pain. 03/03/21   Jaynee Eagles, PA-C   ?aspirin 81 MG chewable tablet Chew 1 tablet (81 mg total) by mouth 2 (two) times daily. 11/19/20   Cherylann Ratel, PA-C  ?estradiol (ESTRACE) 0.5 MG tablet Take 1 tablet (0.5 mg total) by mouth daily. 05/14/21   Libby Maw, MD  ?fluticasone (FLOVENT HFA) 44 MCG/ACT inhaler Inhale 2 puffs into the lungs 2 (two) times daily. 04/23/21   Libby Maw, MD  ?gabapentin (NEURONTIN) 300 MG capsule Take 1 capsule (300 mg total) by mouth 2 (two) times daily for 7 days, THEN 1 capsule (300 mg total) 3 (three) times daily for 23 days. 05/13/21 06/12/21  Libby Maw, MD  ?hydroxypropyl methylcellulose / hypromellose (ISOPTO TEARS / GONIOVISC) 2.5 % ophthalmic solution Place 1 drop into both eyes 4 (four) times daily as needed for dry eyes.    [provider]  ?levocetirizine (XYZAL) 5 MG tablet Take 1 tablet (5 mg total) by mouth every evening. 03/03/21   Jaynee Eagles, PA-C  ?levothyroxine (SYNTHROID) 112 MCG tablet Take 1 tablet (112 mcg total) by mouth daily before breakfast. 05/17/21   Libby Maw, MD  ?methocarbamol (ROBAXIN) 500 MG tablet Take 1 tablet (500 mg total) by mouth at bedtime as needed for muscle spasms. 03/03/21   Jaynee Eagles, PA-C  ?Multiple Vitamins-Minerals (HAIR/SKIN/NAILS/BIOTIN PO) Take 3 tablets by mouth in the morning.    [provider]  ?pantoprazole (PROTONIX) 20 MG tablet TAKE 1 TABLET(20 MG) BY MOUTH DAILY 04/05/21   Libby Maw, MD  ?potassium chloride SA (KLOR-CON M) 20 MEQ tablet Take 1 tablet (20 mEq total) by mouth daily. 05/17/21   Libby Maw, MD  ?traMADol (ULTRAM) 50 MG tablet Take 1 tablet (50 mg total) by mouth every 6 (six) hours as needed for moderate pain. 01/12/21   Persons, Bevely Palmer, PA  ?triamcinolone cream (KENALOG) 0.1 % Apply 1 application topically 2 (two) times daily. 02/09/21   Persons, Bevely Palmer, PA  ? ? ?Family History ?Family History  ?Problem Relation Age of Onset  ? Stomach cancer Maternal  Grandmother   ? Hypertension Maternal Grandmother   ? Diabetes Maternal Grandmother   ? Stroke Maternal Grandmother   ? Cancer Maternal Grandmother   ?     Stomach cancer  ? Colon cancer Paternal Grandfather   ? Heart attack Paternal Grandfather   ? Hyperlipidemia Mother   ? Diabetes Mother   ? Hypertension Mother   ? Alzheimer's disease Mother   ? Diabetes Father   ? Hyperlipidemia Father   ? Heart disease Father   ? Heart attack Father   ? Stroke Paternal Grandmother   ? ? ?Social History ?Social History  ? ?Tobacco Use  ? Smoking status: Former  ?  Types: Cigarettes  ?  Quit date: 03/23/1976  ?  Years since quitting: 45.1  ?  Smokeless tobacco: Never  ?Vaping Use  ? Vaping Use: Never used  ?Substance Use Topics  ? Alcohol use: No  ? Drug use: No  ? ? ? ?Allergies   ?Tetracycline, Dilaudid [hydromorphone hcl], and Other ? ? ?Review of Systems ?Review of Systems  ?Constitutional:  Negative for chills and fever.  ?HENT:  Positive for ear pain, postnasal drip and sore throat.   ?Respiratory:  Negative for cough and shortness of breath.   ?Cardiovascular:  Negative for chest pain and palpitations.  ?Gastrointestinal:  Negative for diarrhea and vomiting.  ?Skin:  Negative for color change and rash.  ?All other systems reviewed and are negative. ? ? ?Physical Exam ?Triage Vital Signs ?ED Triage Vitals  ?Enc Vitals Group  ?   BP   ?   Pulse   ?   Resp   ?   Temp   ?   Temp src   ?   SpO2   ?   Weight   ?   Height   ?   Head Circumference   ?   Peak Flow   ?   Pain Score   ?   Pain Loc   ?   Pain Edu?   ?   Excl. in [place]?   ? ?No data found. ? ?Updated Vital Signs ?BP 119/78   Pulse 80   Temp 98 ?F (36.7 ?C)   Resp 18   SpO2 96%  ? ?Visual Acuity ?Right Eye Distance:   ?Left Eye Distance:   ?Bilateral Distance:   ? ?Right Eye Near:   ?Left Eye Near:    ?Bilateral Near:    ? ?Physical Exam ?Vitals and nursing note reviewed.  ?Constitutional:   ?   General: She is not in acute distress. ?   Appearance: Normal appearance.  She is well-developed. She is not ill-appearing.  ?HENT:  ?   Right Ear: Tympanic membrane and ear canal normal.  ?   Left Ear: Tympanic membrane and ear canal normal.  ?   Nose: Nose normal.  ?   Mouth/Throat:

## 2021-05-19 NOTE — Discharge Instructions (Addendum)
Take Tylenol or ibuprofen as needed for discomfort.  Take Zyrtec and use Flonase nasal spray for allergy symptoms.  Follow up with your primary care provider if your symptoms are not improving.   ? ?

## 2021-05-27 ENCOUNTER — Other Ambulatory Visit: Payer: Self-pay

## 2021-05-27 DIAGNOSIS — N393 Stress incontinence (female) (male): Secondary | ICD-10-CM

## 2021-06-03 ENCOUNTER — Other Ambulatory Visit: Payer: Self-pay

## 2021-06-03 DIAGNOSIS — N1831 Chronic kidney disease, stage 3a: Secondary | ICD-10-CM

## 2021-06-15 ENCOUNTER — Ambulatory Visit: Payer: PPO | Admitting: Family Medicine

## 2021-06-16 ENCOUNTER — Telehealth: Payer: PPO | Admitting: Physician Assistant

## 2021-06-16 DIAGNOSIS — J208 Acute bronchitis due to other specified organisms: Secondary | ICD-10-CM

## 2021-06-16 DIAGNOSIS — B9689 Other specified bacterial agents as the cause of diseases classified elsewhere: Secondary | ICD-10-CM | POA: Diagnosis not present

## 2021-06-16 MED ORDER — PROMETHAZINE-DM 6.25-15 MG/5ML PO SYRP: 5.0000 mL | ORAL_SOLUTION | Freq: Four times a day (QID) | ORAL | 0 refills | Status: DC | PRN

## 2021-06-16 MED ORDER — AMOXICILLIN-POT CLAVULANATE 875-125 MG PO TABS: 1.0000 | ORAL_TABLET | Freq: Two times a day (BID) | ORAL | 0 refills | Status: AC

## 2021-06-16 NOTE — Progress Notes (Signed)
Virtual Visit Consent   Megan Valentine, you are scheduled for a virtual visit with a [place] provider today. Just as with appointments in the office, your consent must be obtained to participate. Your consent will be active for this visit and any virtual visit you may have with one of our providers in the next 365 days. If you have a MyChart account, a copy of this consent can be sent to you electronically.  As this is a virtual visit, video technology does not allow for your provider to perform a traditional examination. This may limit your provider's ability to fully assess your condition. If your provider identifies any concerns that need to be evaluated in person or the need to arrange testing (such as labs, EKG, etc.), we will make arrangements to do so. Although advances in technology are sophisticated, we cannot ensure that it will always work on either your end or our end. If the connection with a video visit is poor, the visit may have to be switched to a telephone visit. With either a video or telephone visit, we are not always able to ensure that we have a secure connection.  By engaging in this virtual visit, you consent to the provision of healthcare and authorize for your insurance to be billed (if applicable) for the services provided during this visit. Depending on your insurance coverage, you may receive a charge related to this service.  I need to obtain your verbal consent now. Are you willing to proceed with your visit today? Megan Valentine has provided verbal consent on 06/16/2021 for a virtual visit (video or telephone). [name], [place]  Date: 06/16/2021 7:19 PM  Virtual Visit via Video Note   I, [name], connected with  Megan Valentine  ([id number], March 12, 1950) on 06/16/21 at  7:15 PM EDT by a video-enabled telemedicine application and verified that I am speaking with the correct person using two identifiers.  Location: Patient: Virtual Visit Location  Patient: Home Provider: Virtual Visit Location Provider: Home Office   I discussed the limitations of evaluation and management by telemedicine and the availability of in person appointments. The patient expressed understanding and agreed to proceed.    History of Present Illness: Megan Valentine is a 71 y.o. who identifies as a female who was assigned female at birth, and is being seen today for URI symptoms x 1.5 week. Notes cough and chest congestion that has progressed since onset. Has had some chills but no fever. Some nasal and head congestion without sinus pain. Notes cough is persistent.   HPI: HPI  Problems:  Patient Active Problem List   Diagnosis Date Noted   Neuropathy 05/13/2021   Venous insufficiency 05/13/2021   Low back pain 04/05/2021   Acquired leg length discrepancy 04/05/2021   Urinary urgency 01/14/2021   Acute cystitis without hematuria 01/14/2021   Macrocytosis 12/07/2020   S/P TKR (total knee replacement) using cement, left 11/17/2020   Chronic pain of right knee 09/30/2020   Medication side effect 09/30/2020   Statin intolerance 06/30/2020   Trigger point of shoulder region, left 02/25/2020   Elevated glucose 08/15/2019   Anemia 02/15/2019   Abdominal pain 12/14/2018   Sinus pressure 11/12/2018   History of 2019 novel coronavirus disease (COVID-19) 11/12/2018   Pleurisy 06/05/2018   Enterocele 06/05/2018   Rectocele 05/23/2018   Vaginal vault prolapse 05/23/2018   SUI (stress urinary incontinence, female) 05/23/2018   Asthma 05/07/2018   Stage 3a chronic kidney  disease ([place]) 01/15/2018   Edema 12/15/2017   Insomnia 11/20/2017   Seborrheic dermatitis 11/16/2017   Seasonal allergic rhinitis due to pollen 11/16/2017   Menopausal and female climacteric states 10/19/2017   Constipation 03/26/2015   Unilateral primary osteoarthritis, left knee 11/17/2014   Healthcare maintenance 04/25/2014   Hypokalemia 09/15/2011   Hypothyroidism 08/21/2008   Mixed  hyperlipidemia 08/21/2008    Allergies:  Allergies  Allergen Reactions   Tetracycline Hives and Itching   Dilaudid [Hydromorphone Hcl] Nausea And Vomiting   Other     Cigarette smoke and perfumes--flares asthma (develops bronchitis)   Medications:  Current Outpatient Medications:    amoxicillin-clavulanate (AUGMENTIN) 875-125 MG tablet, Take 1 tablet by mouth 2 (two) times daily for 7 days., Disp: 14 tablet, Rfl: 0   promethazine-dextromethorphan (PROMETHAZINE-DM) 6.25-15 MG/5ML syrup, Take 5 mLs by mouth 4 (four) times daily as needed for cough., Disp: 118 mL, Rfl: 0   acetaminophen (TYLENOL) 325 MG tablet, Take 2 tablets (650 mg total) by mouth every 6 (six) hours as needed for moderate pain., Disp: 30 tablet, Rfl: 0   aspirin 81 MG chewable tablet, Chew 1 tablet (81 mg total) by mouth 2 (two) times daily., Disp: , Rfl:    estradiol (ESTRACE) 0.5 MG tablet, Take 1 tablet (0.5 mg total) by mouth daily., Disp: 90 tablet, Rfl: 0   fluticasone (FLOVENT HFA) 44 MCG/ACT inhaler, Inhale 2 puffs into the lungs 2 (two) times daily., Disp: 1 each, Rfl: 6   gabapentin (NEURONTIN) 300 MG capsule, Take 1 capsule (300 mg total) by mouth 2 (two) times daily for 7 days, THEN 1 capsule (300 mg total) 3 (three) times daily for 23 days., Disp: 90 capsule, Rfl: 3   hydroxypropyl methylcellulose / hypromellose (ISOPTO TEARS / GONIOVISC) 2.5 % ophthalmic solution, Place 1 drop into both eyes 4 (four) times daily as needed for dry eyes., Disp: , Rfl:    levocetirizine (XYZAL) 5 MG tablet, Take 1 tablet (5 mg total) by mouth every evening., Disp: 30 tablet, Rfl: 0   levothyroxine (SYNTHROID) 112 MCG tablet, Take 1 tablet (112 mcg total) by mouth daily before breakfast., Disp: 90 tablet, Rfl: 3   methocarbamol (ROBAXIN) 500 MG tablet, Take 1 tablet (500 mg total) by mouth at bedtime as needed for muscle spasms., Disp: 30 tablet, Rfl: 0   Multiple Vitamins-Minerals (HAIR/SKIN/NAILS/BIOTIN PO), Take 3 tablets by mouth  in the morning., Disp: , Rfl:    pantoprazole (PROTONIX) 20 MG tablet, TAKE 1 TABLET(20 MG) BY MOUTH DAILY, Disp: 90 tablet, Rfl: 1   potassium chloride SA (KLOR-CON M) 20 MEQ tablet, Take 1 tablet (20 mEq total) by mouth daily., Disp: 30 tablet, Rfl: 3   traMADol (ULTRAM) 50 MG tablet, Take 1 tablet (50 mg total) by mouth every 6 (six) hours as needed for moderate pain., Disp: 30 tablet, Rfl: 0   triamcinolone cream (KENALOG) 0.1 %, Apply 1 application topically 2 (two) times daily., Disp: 80 g, Rfl: 0  Observations/Objective: Patient is well-developed, well-nourished in no acute distress.  Resting comfortably at home.  Head is normocephalic, atraumatic.  No labored breathing. Speech is clear and coherent with logical content.  Patient is alert and oriented at baseline.   Assessment and Plan: 1. Acute bacterial bronchitis - promethazine-dextromethorphan (PROMETHAZINE-DM) 6.25-15 MG/5ML syrup; Take 5 mLs by mouth 4 (four) times daily as needed for cough.  Dispense: 118 mL; Refill: 0 - amoxicillin-clavulanate (AUGMENTIN) 875-125 MG tablet; Take 1 tablet by mouth 2 (two) times daily for  7 days.  Dispense: 14 tablet; Refill: 0  Rx Augmentin.  Increase fluids.  Rest.  Saline nasal spray.  Probiotic.  Mucinex as directed.  Humidifier in bedroom. Promethazine-DM per orders.  Call or return to clinic if symptoms are not improving.   Follow Up Instructions: I discussed the assessment and treatment plan with the patient. The patient was provided an opportunity to ask questions and all were answered. The patient agreed with the plan and demonstrated an understanding of the instructions.  A copy of instructions were sent to the patient via MyChart unless otherwise noted below.   The patient was advised to call back or seek an in-person evaluation if the symptoms worsen or if the condition fails to improve as anticipated.  Time:  I spent 10 minutes with the patient via telehealth technology  discussing the above problems/concerns.    [name], PA-C

## 2021-06-16 NOTE — Patient Instructions (Signed)
Megan Valentine, thank you for joining Megan Rio, PA-C for today's virtual visit.  While this provider is not your primary care provider (PCP), if your PCP is located in our provider database this encounter information will be shared with them immediately following your visit.  Consent: (Patient) Megan Valentine provided verbal consent for this virtual visit at the beginning of the encounter.  Current Medications:  Current Outpatient Medications:    acetaminophen (TYLENOL) 325 MG tablet, Take 2 tablets (650 mg total) by mouth every 6 (six) hours as needed for moderate pain., Disp: 30 tablet, Rfl: 0   aspirin 81 MG chewable tablet, Chew 1 tablet (81 mg total) by mouth 2 (two) times daily., Disp: , Rfl:    estradiol (ESTRACE) 0.5 MG tablet, Take 1 tablet (0.5 mg total) by mouth daily., Disp: 90 tablet, Rfl: 0   fluticasone (FLOVENT HFA) 44 MCG/ACT inhaler, Inhale 2 puffs into the lungs 2 (two) times daily., Disp: 1 each, Rfl: 6   gabapentin (NEURONTIN) 300 MG capsule, Take 1 capsule (300 mg total) by mouth 2 (two) times daily for 7 days, THEN 1 capsule (300 mg total) 3 (three) times daily for 23 days., Disp: 90 capsule, Rfl: 3   hydroxypropyl methylcellulose / hypromellose (ISOPTO TEARS / GONIOVISC) 2.5 % ophthalmic solution, Place 1 drop into both eyes 4 (four) times daily as needed for dry eyes., Disp: , Rfl:    levocetirizine (XYZAL) 5 MG tablet, Take 1 tablet (5 mg total) by mouth every evening., Disp: 30 tablet, Rfl: 0   levothyroxine (SYNTHROID) 112 MCG tablet, Take 1 tablet (112 mcg total) by mouth daily before breakfast., Disp: 90 tablet, Rfl: 3   methocarbamol (ROBAXIN) 500 MG tablet, Take 1 tablet (500 mg total) by mouth at bedtime as needed for muscle spasms., Disp: 30 tablet, Rfl: 0   Multiple Vitamins-Minerals (HAIR/SKIN/NAILS/BIOTIN PO), Take 3 tablets by mouth in the morning., Disp: , Rfl:    pantoprazole (PROTONIX) 20 MG tablet, TAKE 1 TABLET(20 MG) BY MOUTH DAILY, Disp: 90  tablet, Rfl: 1   potassium chloride SA (KLOR-CON M) 20 MEQ tablet, Take 1 tablet (20 mEq total) by mouth daily., Disp: 30 tablet, Rfl: 3   traMADol (ULTRAM) 50 MG tablet, Take 1 tablet (50 mg total) by mouth every 6 (six) hours as needed for moderate pain., Disp: 30 tablet, Rfl: 0   triamcinolone cream (KENALOG) 0.1 %, Apply 1 application topically 2 (two) times daily., Disp: 80 g, Rfl: 0   Medications ordered in this encounter:  No orders of the defined types were placed in this encounter.    *If you need refills on other medications prior to your next appointment, please contact your pharmacy*  Follow-Up: Call back or seek an in-person evaluation if the symptoms worsen or if the condition fails to improve as anticipated.  Other Instructions Take antibiotic (Augmentin) as directed.  Increase fluids.  Get plenty of rest. Use Mucinex for congestion. Use the prescription syrup as directed for cough. Take a daily probiotic (I recommend Align or Culturelle, but even Activia Yogurt may be beneficial).  A humidifier placed in the bedroom may offer some relief for a dry, scratchy throat of nasal irritation.  Read information below on acute bronchitis. Please call or return to clinic if symptoms are not improving.  Acute Bronchitis Bronchitis is when the airways that extend from the windpipe into the lungs get red, puffy, and painful (inflamed). Bronchitis often causes thick spit (mucus) to develop. This leads to  a cough. A cough is the most common symptom of bronchitis. In acute bronchitis, the condition usually begins suddenly and goes away over time (usually in 2 weeks). Smoking, allergies, and asthma can make bronchitis worse. Repeated episodes of bronchitis may cause more lung problems.  HOME CARE Rest. Drink enough fluids to keep your pee (urine) clear or pale yellow (unless you need to limit fluids as told by your doctor). Only take over-the-counter or prescription medicines as told by your  doctor. Avoid smoking and secondhand smoke. These can make bronchitis worse. If you are a smoker, think about using nicotine gum or skin patches. Quitting smoking will help your lungs heal faster. Reduce the chance of getting bronchitis again by: Washing your hands often. Avoiding people with cold symptoms. Trying not to touch your hands to your mouth, nose, or eyes. Follow up with your doctor as told.  GET HELP IF: Your symptoms do not improve after 1 week of treatment. Symptoms include: Cough. Fever. Coughing up thick spit. Body aches. Chest congestion. Chills. Shortness of breath. Sore throat.  GET HELP RIGHT AWAY IF:  You have an increased fever. You have chills. You have severe shortness of breath. You have bloody thick spit (sputum). You throw up (vomit) often. You lose too much body fluid (dehydration). You have a severe headache. You faint.  MAKE SURE YOU:  Understand these instructions. Will watch your condition. Will get help right away if you are not doing well or get worse. Document Released: 06/29/2007 Document Revised: 09/12/2012 Document Reviewed: 07/03/2012 [facility] Patient Information 2015 [place], [place]. This information is not intended to replace advice given to you by your health care provider. Make sure you discuss any questions you have with your health care provider.    If you have been instructed to have an in-person evaluation today at a local Urgent Care facility, please use the link below. It will take you to a list of all of our available [facility], including address, phone number and hours of operation. Please do not delay care.  [facility]  If you or a family member do not have a primary care provider, use the link below to schedule a visit and establish care. When you choose a [facility] primary care physician or advanced practice provider, you gain a long-term partner in health. Find a Primary Care  Provider  Learn more about [facility]'s in-office and virtual care options: [facility]

## 2021-06-29 DIAGNOSIS — Z01411 Encounter for gynecological examination (general) (routine) with abnormal findings: Secondary | ICD-10-CM | POA: Diagnosis not present

## 2021-06-29 DIAGNOSIS — Z6828 Body mass index (BMI) 28.0-28.9, adult: Secondary | ICD-10-CM | POA: Diagnosis not present

## 2021-06-29 DIAGNOSIS — Z90711 Acquired absence of uterus with remaining cervical stump: Secondary | ICD-10-CM | POA: Diagnosis not present

## 2021-06-29 DIAGNOSIS — N951 Menopausal and female climacteric states: Secondary | ICD-10-CM | POA: Diagnosis not present

## 2021-06-29 DIAGNOSIS — Z124 Encounter for screening for malignant neoplasm of cervix: Secondary | ICD-10-CM | POA: Diagnosis not present

## 2021-06-29 DIAGNOSIS — Z0142 Encounter for cervical smear to confirm findings of recent normal smear following initial abnormal smear: Secondary | ICD-10-CM | POA: Diagnosis not present

## 2021-06-29 DIAGNOSIS — Z01419 Encounter for gynecological examination (general) (routine) without abnormal findings: Secondary | ICD-10-CM | POA: Diagnosis not present

## 2021-07-06 ENCOUNTER — Ambulatory Visit (INDEPENDENT_AMBULATORY_CARE_PROVIDER_SITE_OTHER): Payer: PPO | Admitting: Family Medicine

## 2021-07-06 ENCOUNTER — Encounter: Payer: Self-pay | Admitting: Family Medicine

## 2021-07-06 VITALS — BP 114/74 | HR 73 | Temp 96.9°F | Ht 64.0 in | Wt 165.0 lb

## 2021-07-06 DIAGNOSIS — M791 Myalgia, unspecified site: Secondary | ICD-10-CM | POA: Diagnosis not present

## 2021-07-06 DIAGNOSIS — N951 Menopausal and female climacteric states: Secondary | ICD-10-CM

## 2021-07-06 DIAGNOSIS — T466X5A Adverse effect of antihyperlipidemic and antiarteriosclerotic drugs, initial encounter: Secondary | ICD-10-CM

## 2021-07-06 DIAGNOSIS — E876 Hypokalemia: Secondary | ICD-10-CM

## 2021-07-06 MED ORDER — ESTRADIOL 0.5 MG PO TABS: ORAL_TABLET | ORAL | 0 refills | Status: DC

## 2021-07-06 NOTE — Progress Notes (Signed)
Established Patient Office Visit  Subjective   Patient ID: Megan Valentine, female    DOB: 1950-03-08  Age: 71 y.o. MRN: [id number]  Chief Complaint  Patient presents with   Follow-up    4 week follow up on TSH and cholesterol. Patient not fasting.     HPI follow-up of hypokalemia, postmenopausal hot flashes, and statin myalgias.  Patient tried Zetia and developed muscle aches all throughout her body and discontinued it.  When she restarted they returned.  She has tried multiple statins with the same effect.  Saw GYN who placed patient on estrogen patch that is unaffordable.  Was concerned about cardiovascular disease with continued use of the pill.  Patient would like to try to taper off of estrogen.  TSH was normal last visit continues with potassium supplementation    Review of Systems  Constitutional:  Negative for chills, diaphoresis, malaise/fatigue and weight loss.  HENT: Negative.    Eyes: Negative.  Negative for blurred vision and double vision.  Cardiovascular:  Negative for chest pain.  Gastrointestinal:  Negative for abdominal pain.  Genitourinary: Negative.   Musculoskeletal:  Negative for falls and myalgias.  Neurological:  Negative for speech change, loss of consciousness and weakness.  Psychiatric/Behavioral: Negative.        Objective:     BP 114/74 (BP Location: Right Arm, Patient Position: Sitting, Cuff Size: Large)   Pulse 73   Temp (!) 96.9 F (36.1 C) (Temporal)   Ht '5\' 4"'$  (1.626 m)   Wt 165 lb (74.8 kg)   SpO2 97%   BMI 28.32 kg/m    Physical Exam Constitutional:      General: She is not in acute distress.    Appearance: Normal appearance. She is not ill-appearing, toxic-appearing or diaphoretic.  HENT:     Head: Normocephalic and atraumatic.     Right Ear: External ear normal.     Left Ear: External ear normal.     Mouth/Throat:     Mouth: Mucous membranes are moist.     Pharynx: Oropharynx is clear. No oropharyngeal exudate or posterior  oropharyngeal erythema.  Eyes:     General: No scleral icterus.       Right Valentine: No discharge.        Left Valentine: No discharge.     Extraocular Movements: Extraocular movements intact.     Conjunctiva/sclera: Conjunctivae normal.     Pupils: Pupils are equal, round, and reactive to light.  Cardiovascular:     Rate and Rhythm: Normal rate and regular rhythm.  Pulmonary:     Effort: Pulmonary effort is normal. No respiratory distress.     Breath sounds: Normal breath sounds.  Abdominal:     General: Bowel sounds are normal.  Musculoskeletal:     Cervical back: No rigidity or tenderness.  Skin:    General: Skin is warm and dry.  Neurological:     Mental Status: She is alert and oriented to person, place, and time.  Psychiatric:        Mood and Affect: Mood normal.        Behavior: Behavior normal.      No results found for any visits on 07/06/21.    The 10-year ASCVD risk score (Arnett DK, et al., 2019) is: 8.7%    Assessment & Plan:   Problem List Items Addressed This Visit       Other   Hypokalemia   Relevant Orders   Basic metabolic panel   Menopausal  and female climacteric states - Primary   Relevant Medications   estradiol (ESTRACE) 0.5 MG tablet   Other Visit Diagnoses     Myalgia due to statin           Return in about 6 months (around 01/05/2022).  Gait patient 30 tablets of Estrace and discussed taper.  No refills on this medication.  Rechecking potassium.  Libby Maw, MD

## 2021-07-07 ENCOUNTER — Ambulatory Visit: Payer: PPO | Admitting: Podiatry

## 2021-07-07 DIAGNOSIS — L600 Ingrowing nail: Secondary | ICD-10-CM | POA: Diagnosis not present

## 2021-07-07 LAB — BASIC METABOLIC PANEL
BUN: 13 mg/dL (ref 6–23)
CO2: 29 mEq/L (ref 19–32)
Calcium: 9.4 mg/dL (ref 8.4–10.5)
Chloride: 106 mEq/L (ref 96–112)
Creatinine, Ser: 0.99 mg/dL (ref 0.40–1.20)
GFR: 57.45 mL/min — ABNORMAL LOW (ref >60.00)
Glucose, Bld: 92 mg/dL (ref 70–99)
Potassium: 3.8 mEq/L (ref 3.5–5.1)
Sodium: 141 mEq/L (ref 135–145)

## 2021-07-07 NOTE — Patient Instructions (Signed)

## 2021-07-07 NOTE — Progress Notes (Signed)
Subjective:   Patient ID: Megan Valentine, female   DOB: 71 y.o.   MRN: [id number]   HPI Patient presents stating she has a painful ingrown toenail on the right big toe and states that its been bothering her.  Patient states that she does not smoke likes to be active and its been hurting her for several months   Review of Systems  All other systems reviewed and are negative.       Objective:  Physical Exam Vitals and nursing note reviewed.  Constitutional:      Appearance: She is well-developed.  Pulmonary:     Effort: Pulmonary effort is normal.  Musculoskeletal:        General: Normal range of motion.  Skin:    General: Skin is warm.  Neurological:     Mental Status: She is alert.     Neurovascular status intact muscle strength adequate range of motion adequate incurvated right hallux lateral border painful when pressed making shoe gear difficult.  Patient has good digital perfusion well oriented x3     Assessment:  Grown toenail deformity right hallux lateral border that is painful     Plan:  H&P reviewed condition explained to her the condition and surgery and patient wants it fixed.  I do think this would be best fixed and I explained procedure allowed her to sign consent form and today I went ahead and I infiltrated the right hallux 60 mg like Marcaine mixture sterile prep done using sterile instrumentation remove the lateral border exposed matrix applied phenol 3 applications 30 seconds followed by alcohol lavage sterile dressing.  Gave instructions on soaks reappoint to recheck

## 2021-07-12 ENCOUNTER — Other Ambulatory Visit: Payer: Self-pay

## 2021-07-12 ENCOUNTER — Ambulatory Visit: Payer: PPO

## 2021-07-12 DIAGNOSIS — E876 Hypokalemia: Secondary | ICD-10-CM

## 2021-07-12 DIAGNOSIS — M779 Enthesopathy, unspecified: Secondary | ICD-10-CM

## 2021-07-12 MED ORDER — POTASSIUM CHLORIDE CRYS ER 20 MEQ PO TBCR: 20.0000 meq | EXTENDED_RELEASE_TABLET | Freq: Every day | ORAL | 0 refills | Status: DC

## 2021-07-12 NOTE — Progress Notes (Signed)
SITUATION Reason for Consult: Evaluation for Bilateral Custom Foot Orthoses Patient / Caregiver Report: Patient is ready for foot orthotics  OBJECTIVE DATA: Patient History / Diagnosis:    ICD-10-CM   1. Tendonitis  M77.9       Current or Previous Devices:   Prefab only  Foot Examination: Skin presentation:   Intact Ulcers & Callousing:   None Toe / Foot Deformities:  Right LLD Weight Bearing Presentation:  Rectus Sensation:    Intact  Shoe Size:    6.5  ORTHOTIC RECOMMENDATION Recommended Device: 1x pair of custom functional foot orthotics  GOALS OF ORTHOSES - Reduce Pain - Prevent Foot Deformity - Prevent Progression of Further Foot Deformity - Relieve Pressure - Improve the Overall Biomechanical Function of the Foot and Lower Extremity.  ACTIONS PERFORMED Potential out of pocket cost was communicated to patient. Patient understood and consent to casting. Patient was casted for Foot Orthoses via crush box. Procedure was explained and patient tolerated procedure well. Casts were shipped to central fabrication. All questions were answered and concerns addressed.  PLAN Patient is to be called for fitting when devices are ready.

## 2021-08-02 ENCOUNTER — Other Ambulatory Visit: Payer: Self-pay | Admitting: Family Medicine

## 2021-08-02 DIAGNOSIS — N951 Menopausal and female climacteric states: Secondary | ICD-10-CM

## 2021-08-09 ENCOUNTER — Other Ambulatory Visit: Payer: Self-pay | Admitting: Family Medicine

## 2021-08-09 DIAGNOSIS — Z1231 Encounter for screening mammogram for malignant neoplasm of breast: Secondary | ICD-10-CM

## 2021-08-10 ENCOUNTER — Other Ambulatory Visit: Payer: Self-pay | Admitting: Family Medicine

## 2021-08-10 DIAGNOSIS — N951 Menopausal and female climacteric states: Secondary | ICD-10-CM

## 2021-08-17 ENCOUNTER — Ambulatory Visit: Payer: PPO | Admitting: Family Medicine

## 2021-08-23 DIAGNOSIS — H16223 Keratoconjunctivitis sicca, not specified as Sjogren's, bilateral: Secondary | ICD-10-CM | POA: Diagnosis not present

## 2021-08-23 DIAGNOSIS — H40022 Open angle with borderline findings, high risk, left eye: Secondary | ICD-10-CM | POA: Diagnosis not present

## 2021-08-23 DIAGNOSIS — H40011 Open angle with borderline findings, low risk, right eye: Secondary | ICD-10-CM | POA: Diagnosis not present

## 2021-08-23 DIAGNOSIS — H2129 Other iris atrophy: Secondary | ICD-10-CM | POA: Diagnosis not present

## 2021-08-23 DIAGNOSIS — Z961 Presence of intraocular lens: Secondary | ICD-10-CM | POA: Diagnosis not present

## 2021-08-24 ENCOUNTER — Ambulatory Visit (INDEPENDENT_AMBULATORY_CARE_PROVIDER_SITE_OTHER): Payer: PPO

## 2021-08-24 DIAGNOSIS — M779 Enthesopathy, unspecified: Secondary | ICD-10-CM

## 2021-08-25 NOTE — Progress Notes (Signed)
Patient presents today to pick up custom molded foot orthotics, diagnosed with tendonitis by Dr. Paulla Dolly.   Orthotics were dispensed and fit was satisfactory. Reviewed instructions for break-in and wear. Written instructions given to patient.  Patient will follow up as needed.   [name] - order # [id number]

## 2021-08-30 ENCOUNTER — Ambulatory Visit (INDEPENDENT_AMBULATORY_CARE_PROVIDER_SITE_OTHER): Payer: PPO | Admitting: *Deleted

## 2021-08-30 DIAGNOSIS — M779 Enthesopathy, unspecified: Secondary | ICD-10-CM

## 2021-08-30 NOTE — Progress Notes (Signed)
Patient presents today requesting an adjustment to her orthotics.  She has a limb length difference due to hip surgery right side and felt that the orthotic was not built up enough.  Added material and skived to lift the heel a little more.  Patient tried the orthotic on and states she could already feel the difference and was satisfied with the modification.  Patient will follow up as needed.

## 2021-08-31 ENCOUNTER — Ambulatory Visit: Payer: PPO | Admitting: Family Medicine

## 2021-09-06 ENCOUNTER — Encounter: Payer: Self-pay | Admitting: Family Medicine

## 2021-09-06 ENCOUNTER — Ambulatory Visit (INDEPENDENT_AMBULATORY_CARE_PROVIDER_SITE_OTHER): Payer: PPO | Admitting: Family Medicine

## 2021-09-06 VITALS — BP 108/70 | HR 93 | Temp 98.1°F | Ht 64.0 in | Wt 163.6 lb

## 2021-09-06 DIAGNOSIS — M19041 Primary osteoarthritis, right hand: Secondary | ICD-10-CM

## 2021-09-06 DIAGNOSIS — E538 Deficiency of other specified B group vitamins: Secondary | ICD-10-CM | POA: Diagnosis not present

## 2021-09-06 DIAGNOSIS — G5601 Carpal tunnel syndrome, right upper limb: Secondary | ICD-10-CM

## 2021-09-06 LAB — VITAMIN B12: Vitamin B-12: 971 pg/mL — ABNORMAL HIGH (ref 211–911)

## 2021-09-06 MED ORDER — DICLOFENAC SODIUM 1 % EX GEL: CUTANEOUS | 1 refills | Status: AC

## 2021-09-06 NOTE — Progress Notes (Unsigned)
Megan Valentine Phone: [phone] Subjective:   Megan Valentine, am serving as a scribe for Dr. Hulan Saas.  I'm seeing this patient by the request  of:  Libby Maw, MD  CC:   [id number]  05/17/2021 Patient is doing much better at this time: Valentine significant changes.  I do feel that the leg length discrepancy has been beneficial.  Discussed home exercises and icing regimen.  Follow-up again in 12 weeks  Updated 09/07/2021 Megan Valentine is a 71 y.o. female coming in with complaint of LBP and R carpal tunnel. Patient states that her pain is worse when she rolls over at night in bed. Pain in both hips as well over lumbar spine. Sleeps on heating pad. Patient recently had orthotics made to help with leg length discrepancy.   Pain right hand for past 2 weeks that radiates into the middle finger. Does do a lot of typing and writing at work since 1984.        Past Medical History:  Diagnosis Date   Allergy    Arthritis    Asthma    Cataract    Chronic bronchitis ([place])    Chronic kidney disease    stage 3, kidney infections   Complication of anesthesia    hard to wake up   Diverticulosis    Environmental allergies    GERD (gastroesophageal reflux disease)    Hiatal hernia    History of chicken pox    Hyperlipidemia    Hypothyroidism    IBS (irritable bowel syndrome)    Migraines    Mitral valve prolapse    Per pt . per Dr.Henry  [name].  does not have MVP   Neuromuscular disorder ([place])    neuropathy feet   Pneumonia    Rectal prolapse    Past Surgical History:  Procedure Laterality Date   ABDOMINAL HYSTERECTOMY  1999   APPENDECTOMY  1962   BREAST CYST ASPIRATION Bilateral    CHOLECYSTECTOMY N/A 12/17/2018   Procedure: LAPAROSCOPIC CHOLECYSTECTOMY;  Surgeon: Clovis Riley, MD;  Location: [facility];  Service: General;  Laterality: N/A;   EYE SURGERY     cataract   JOINT REPLACEMENT      rt knee scope   REFRACTIVE SURGERY  2000   both eyes   retrocele repair     and bladder sling   TOTAL KNEE ARTHROPLASTY Right 03/24/2015   Procedure: TOTAL KNEE ARTHROPLASTY;  Surgeon: [name], MD;  Location: [facility];  Service: Orthopedics;  Laterality: Right;   TOTAL KNEE ARTHROPLASTY Left 11/17/2020   Procedure: LEFT TOTAL KNEE ARTHROPLASTY;  Surgeon: [name], MD;  Location: [facility];  Service: Orthopedics;  Laterality: Left;   TUBAL LIGATION  1977   Social History   Socioeconomic History   Marital status: Widowed    Spouse name: Not on file   Number of children: Not on file   Years of education: 12   Highest education level: Not on file  Occupational History   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    Employer: [employer]  Tobacco Use   Smoking status: Former    Types: Cigarettes    Quit date: 03/23/1976    Years since quitting: 45.4   Smokeless tobacco: Never  Vaping Use   Vaping Use: Never used  Substance and Sexual Activity   Alcohol use: Valentine   Drug use: Valentine  Sexual activity: Yes    Birth control/protection: None, Post-menopausal  Other Topics Concern   Not on file  Social History Narrative   Caffeine Use-yes   Regular exercise-Valentine   Right handed    Lives alone   Social Determinants of Health   Financial Resource Strain: Low Risk  (01/27/2021)   Overall Financial Resource Strain (CARDIA)    Difficulty of Paying Living Expenses: Not hard at all  Food Insecurity: Valentine Food Insecurity (01/27/2021)   Hunger Vital Sign    Worried About Running Out of Food in the Last Year: Never true    Ran Out of Food in the Last Year: Never true  Transportation Needs: Valentine Transportation Needs (01/27/2021)   PRAPARE - Hydrologist (Medical): Valentine    Lack of Transportation (Non-Medical): Valentine  Physical Activity: Insufficiently Active (01/27/2021)   Exercise Vital Sign    Days of Exercise per Week: 3 days    Minutes of Exercise  per Session: 20 min  Stress: Valentine Stress Concern Present (01/27/2021)   [place]    Feeling of Stress : Not at all  Social Connections: Moderately Isolated (01/27/2021)   Social Connection and Isolation Panel [NHANES]    Frequency of Communication with Friends and Family: Twice a week    Frequency of Social Gatherings with Friends and Family: Twice a week    Attends Religious Services: More than 4 times per year    Active Member of [employer] or Organizations: Valentine    Attends Archivist Meetings: Never    Marital Status: Widowed   Allergies  Allergen Reactions   Tetracycline Hives and Itching   Dilaudid [Hydromorphone Hcl] Nausea And Vomiting   Other     Cigarette smoke and perfumes--flares asthma (develops bronchitis)   Family History  Problem Relation Age of Onset   Stomach cancer Maternal Grandmother    Hypertension Maternal Grandmother    Diabetes Maternal Grandmother    Stroke Maternal Grandmother    Cancer Maternal Grandmother        Stomach cancer   Colon cancer Paternal Grandfather    Heart attack Paternal Grandfather    Hyperlipidemia Mother    Diabetes Mother    Hypertension Mother    Alzheimer's disease Mother    Diabetes Father    Hyperlipidemia Father    Heart disease Father    Heart attack Father    Stroke Paternal Grandmother     Current Outpatient Medications (Endocrine & Metabolic):    estradiol (ESTRACE) 0.5 MG tablet, Taper as directed. Valentine further refills.   levothyroxine (SYNTHROID) 112 MCG tablet, Take 1 tablet (112 mcg total) by mouth daily before breakfast.   Current Outpatient Medications (Respiratory):    albuterol (VENTOLIN HFA) 108 (90 Base) MCG/ACT inhaler, Inhale into the lungs every 6 (six) hours as needed for wheezing or shortness of breath.   fluticasone (FLOVENT HFA) 44 MCG/ACT inhaler, Inhale 2 puffs into the lungs 2 (two) times daily.   levocetirizine (XYZAL) 5 MG  tablet, Take 1 tablet (5 mg total) by mouth every evening.  Current Outpatient Medications (Analgesics):    acetaminophen (TYLENOL) 325 MG tablet, Take 2 tablets (650 mg total) by mouth every 6 (six) hours as needed for moderate pain.   aspirin 81 MG chewable tablet, Chew 1 tablet (81 mg total) by mouth 2 (two) times daily.   Current Outpatient Medications (Other):    diclofenac Sodium (VOLTAREN ARTHRITIS PAIN) 1 %  GEL, Apply a small grape sized dollop 1 disorder 2 weeks of pends up to 4 times daily as needed   hydroxypropyl methylcellulose / hypromellose (ISOPTO TEARS / GONIOVISC) 2.5 % ophthalmic solution, Place 1 drop into both eyes 4 (four) times daily as needed for dry eyes.   Multiple Vitamins-Minerals (HAIR/SKIN/NAILS/BIOTIN PO), Take 3 tablets by mouth in the morning.   pantoprazole (PROTONIX) 20 MG tablet, TAKE 1 TABLET(20 MG) BY MOUTH DAILY   potassium chloride SA (KLOR-CON M) 20 MEQ tablet, Take 1 tablet (20 mEq total) by mouth daily.   gabapentin (NEURONTIN) 300 MG capsule, Take 1 capsule (300 mg total) by mouth 2 (two) times daily for 7 days, THEN 1 capsule (300 mg total) 3 (three) times daily for 23 days.   Reviewed prior external information including notes and imaging from  primary care provider As well as notes that were available from care everywhere and other healthcare systems.  Past medical history, social, surgical and family history all reviewed in electronic medical record.  Valentine pertanent information unless stated regarding to the chief complaint.   Review of Systems:  Valentine headache, visual changes, nausea, vomiting, diarrhea, constipation, dizziness, abdominal pain, skin rash, fevers, chills, night sweats, weight loss, swollen lymph nodes, body aches, joint swelling, chest pain, shortness of breath, mood changes. POSITIVE muscle aches  Objective  Blood pressure 104/72, pulse 75, height '5\' 4"'$  (1.626 m), weight 165 lb (74.8 kg), SpO2 99 %.   General: Valentine apparent  distress alert and oriented x3 mood and affect normal, dressed appropriately.  HEENT: Pupils equal, extraocular movements intact  Respiratory: Patient's speak in full sentences and does not appear short of breath  Cardiovascular: Valentine lower extremity edema, non tender, Valentine erythema  Patient does have signs and symptoms of thenar eminence wasting.  Significant arthritic changes of the [facility] joint bilaterally.  Patient does have a positive Tinel's noted on the right.  Patient has 4 out of 5 grip strength but is symmetric bilaterally. Back exam does have loss of lordosis.  Pain is out of proportion to the amount of palpation.  Tender to palpation overall.  Tightness with straight leg test with mild radicular symptoms on the right side.  Worsening pain with extension over 5 degrees as well.  Limited muscular skeletal ultrasound was performed and interpreted by Hulan Saas, M  Limited ultrasound of patient's wrist shows the patient does have a bifid median nerve on the right side.  This is also has significant hypoechoic changes noted.  Patient does have severe narrowing of the CMC joints bilaterally. Impression: Carpal tunnel and CMC arthritis   Impression and Recommendations:     The above documentation has been reviewed and is accurate and complete Lyndal Pulley, DO

## 2021-09-06 NOTE — Progress Notes (Signed)
Established Patient Office Visit  Subjective   Patient ID: Megan Valentine, female    DOB: [birth year]  Age: 71 y.o. MRN: [id number]  Chief Complaint  Patient presents with   Numbness    Hand and fingers numbness x 2-3 weeks no improvement. Still have all of a sudden sweats.     HPI follow-up of B12 deficiency, numbness and tingling right greater than left fingers or hands.  She is having right third digit.  No recent injury.  She has done a lot of typing and working years.  Continues to work 3 days weekly.  Right-hand-dominant.  No longer supplementing with B12.  She is scheduled  Review of Systems  Constitutional:  Negative for chills, diaphoresis, malaise/fatigue and weight loss.  HENT: Negative.    Eyes: Negative.  Negative for blurred vision and double vision.  Cardiovascular:  Negative for chest pain.  Gastrointestinal:  Negative for abdominal pain.  Genitourinary: Negative.   Musculoskeletal:  Positive for joint pain. Negative for falls and myalgias.  Neurological:  Positive for tingling. Negative for speech change, loss of consciousness and weakness.  Psychiatric/Behavioral: Negative.        Objective:     BP 108/70 (BP Location: Right Arm, Patient Position: Sitting, Cuff Size: Normal)   Pulse 93   Temp 98.1 F (36.7 C) (Temporal)   Ht '5\' 4"'$  (1.626 m)   Wt 163 lb 9.6 oz (74.2 kg)   SpO2 94%   BMI 28.08 kg/m    Physical Exam Constitutional:      General: She is not in acute distress.    Appearance: Normal appearance. She is not ill-appearing, toxic-appearing or diaphoretic.  HENT:     Head: Normocephalic and atraumatic.     Right Ear: External ear normal.     Left Ear: External ear normal.  Eyes:     General: No scleral icterus.       Right eye: No discharge.        Left eye: No discharge.     Extraocular Movements: Extraocular movements intact.     Conjunctiva/sclera: Conjunctivae normal.     Pupils: Pupils are equal, round, and reactive to light.   Cardiovascular:     Rate and Rhythm: Normal rate and regular rhythm.  Pulmonary:     Effort: Pulmonary effort is normal. No respiratory distress.     Breath sounds: Normal breath sounds.  Abdominal:     General: Bowel sounds are normal.  Musculoskeletal:     Right hand: Swelling and deformity present. No bony tenderness. Normal range of motion. Normal strength. Normal capillary refill.       Arms:  Skin:    General: Skin is warm and dry.  Neurological:     Mental Status: She is alert and oriented to person, place, and time.  Psychiatric:        Mood and Affect: Mood normal.        Behavior: Behavior normal.      No results found for any visits on 09/06/21.    The 10-year ASCVD risk score (Arnett DK, et al., 2019) is: 7.9%    Assessment & Plan:   Problem List Items Addressed This Visit       Nervous and Auditory   Carpal tunnel syndrome of right wrist - Primary     Musculoskeletal and Integument   Primary osteoarthritis of right hand   Relevant Medications   diclofenac Sodium (VOLTAREN ARTHRITIS PAIN) 1 % GEL  Other   B12 deficiency   Relevant Orders   Vitamin B12    Return Due for follow up in December..  Rechecking B12 levels.  Will use Voltaren gel for sore joints on pends as needed.  She is seeing Dr. Tamala Julian for lower back pain.  Suggested that when she sees him again, she schedule a follow-up appointment for the numbness and pain she is experiencing in her hands .  Libby Maw, MD

## 2021-09-07 ENCOUNTER — Ambulatory Visit: Payer: PPO | Admitting: Family Medicine

## 2021-09-07 ENCOUNTER — Encounter: Payer: Self-pay | Admitting: Family Medicine

## 2021-09-07 ENCOUNTER — Ambulatory Visit: Payer: Self-pay

## 2021-09-07 VITALS — BP 104/72 | HR 75 | Ht 64.0 in | Wt 165.0 lb

## 2021-09-07 DIAGNOSIS — M180 Bilateral primary osteoarthritis of first carpometacarpal joints: Secondary | ICD-10-CM | POA: Insufficient documentation

## 2021-09-07 DIAGNOSIS — M545 Low back pain, unspecified: Secondary | ICD-10-CM | POA: Diagnosis not present

## 2021-09-07 DIAGNOSIS — M25531 Pain in right wrist: Secondary | ICD-10-CM | POA: Diagnosis not present

## 2021-09-07 DIAGNOSIS — G5601 Carpal tunnel syndrome, right upper limb: Secondary | ICD-10-CM

## 2021-09-07 DIAGNOSIS — G8929 Other chronic pain: Secondary | ICD-10-CM

## 2021-09-07 NOTE — Patient Instructions (Signed)
Good to see you  Wear wrist brace day and night for 2 weeks then nightly for another 2 weeks Wear the frog splint on the left  thumb at night to help with the trigger thumb  Will get MRI of the lumbar Call [phone] to schedule and I will write you with the results See me again in 6-7 weeks and if not better we will do injeciton on the wrist

## 2021-09-07 NOTE — Assessment & Plan Note (Signed)
Worsening pain that is not responding to any other conservative therapy.  Patient actually does have evidence of weakness of the lower extremities.  We will get further imaging with advanced imaging.  Discussed with patient that we will see how patient responds.  Depending on the MRI we will consider the possibility of epidural.  Patient is in agreement with the plan

## 2021-09-07 NOTE — Assessment & Plan Note (Signed)
We discussed the anatomy involved, and that carpal tunnel syndrome primarily involves the median nerve, and this typically affects digits one through 3.   We also discussed that mild cases of carpal tunnel syndrome are often improved with night splints.  If symptoms persist it is very reasonable to consider a carpal tunnel injection.   If the patient does have moderate to severe carpal tunnel syndrome based on NCV, then it is certainly reasonable to consider surgical consultation for definitive management possible carpal tunnel release.  We also discussed his severe carpal tunnel syndrome can lead to permanent nerve impairment even if released. At this point, the patient like to proceed conservatively. Worsening pain will consider injection and follow-up

## 2021-09-07 NOTE — Assessment & Plan Note (Signed)
Discussed with patient about icing regimen and home exercises.  Which activities to do which ones to avoid, increase activity slowly otherwise.  Follow-up again in 6 to 8 weeks.

## 2021-09-08 ENCOUNTER — Ambulatory Visit
Admission: RE | Admit: 2021-09-08 | Discharge: 2021-09-08 | Disposition: A | Discharge status: Home / Self Care | Payer: PPO | Source: Ambulatory Visit | Attending: Family Medicine | Admitting: Family Medicine

## 2021-09-08 DIAGNOSIS — M48061 Spinal stenosis, lumbar region without neurogenic claudication: Secondary | ICD-10-CM | POA: Diagnosis not present

## 2021-09-08 DIAGNOSIS — G8929 Other chronic pain: Secondary | ICD-10-CM

## 2021-09-08 DIAGNOSIS — M545 Low back pain, unspecified: Secondary | ICD-10-CM | POA: Diagnosis not present

## 2021-09-12 ENCOUNTER — Encounter: Payer: Self-pay | Admitting: Family Medicine

## 2021-09-12 DIAGNOSIS — M545 Low back pain, unspecified: Secondary | ICD-10-CM

## 2021-09-17 ENCOUNTER — Ambulatory Visit
Admission: RE | Admit: 2021-09-17 | Discharge: 2021-09-17 | Disposition: A | Discharge status: Home / Self Care | Payer: PPO | Source: Ambulatory Visit | Attending: Family Medicine | Admitting: Family Medicine

## 2021-09-17 DIAGNOSIS — G8929 Other chronic pain: Secondary | ICD-10-CM

## 2021-09-17 DIAGNOSIS — M47817 Spondylosis without myelopathy or radiculopathy, lumbosacral region: Secondary | ICD-10-CM | POA: Diagnosis not present

## 2021-09-17 MED ORDER — METHYLPREDNISOLONE ACETATE 40 MG/ML INJ SUSP (RADIOLOG
80.0000 mg | Freq: Once | INTRAMUSCULAR | Status: AC
  Administered 2021-09-17: 80 mg via EPIDURAL

## 2021-09-17 MED ORDER — IOPAMIDOL (ISOVUE-M 200) INJECTION 41%
1.0000 mL | Freq: Once | INTRAMUSCULAR | Status: AC
  Administered 2021-09-17: 1 mL via EPIDURAL

## 2021-09-17 NOTE — Discharge Instructions (Signed)

## 2021-09-19 ENCOUNTER — Other Ambulatory Visit: Payer: Self-pay | Admitting: Family Medicine

## 2021-09-19 DIAGNOSIS — G629 Polyneuropathy, unspecified: Secondary | ICD-10-CM

## 2021-09-22 DIAGNOSIS — E039 Hypothyroidism, unspecified: Secondary | ICD-10-CM | POA: Diagnosis not present

## 2021-09-22 DIAGNOSIS — E876 Hypokalemia: Secondary | ICD-10-CM | POA: Diagnosis not present

## 2021-09-22 DIAGNOSIS — N951 Menopausal and female climacteric states: Secondary | ICD-10-CM | POA: Diagnosis not present

## 2021-09-22 DIAGNOSIS — K219 Gastro-esophageal reflux disease without esophagitis: Secondary | ICD-10-CM | POA: Diagnosis not present

## 2021-09-22 DIAGNOSIS — E785 Hyperlipidemia, unspecified: Secondary | ICD-10-CM | POA: Diagnosis not present

## 2021-09-22 DIAGNOSIS — N1831 Chronic kidney disease, stage 3a: Secondary | ICD-10-CM | POA: Diagnosis not present

## 2021-09-28 ENCOUNTER — Ambulatory Visit
Admission: RE | Admit: 2021-09-28 | Discharge: 2021-09-28 | Disposition: A | Discharge status: Home / Self Care | Payer: PPO | Source: Ambulatory Visit | Attending: Family Medicine | Admitting: Family Medicine

## 2021-09-28 DIAGNOSIS — Z1231 Encounter for screening mammogram for malignant neoplasm of breast: Secondary | ICD-10-CM | POA: Diagnosis not present

## 2021-10-04 ENCOUNTER — Encounter: Payer: Self-pay | Admitting: Nurse Practitioner

## 2021-10-18 NOTE — Progress Notes (Unsigned)
Megan Valentine [address] [place] Phone: [phone] Subjective:   Megan Valentine, am serving as a scribe for Dr. Hulan Saas.   I'm seeing this patient by the request  of:  Libby Maw, MD  CC: wrist and thumb pain   [id number]  09/07/2021 Discussed with patient about icing regimen and home exercises.  Which activities to do which ones to avoid, increase activity slowly otherwise.  Follow-up again in 6 to 8 weeks.  We discussed the anatomy involved, and that carpal tunnel syndrome primarily involves the median nerve, and this typically affects digits one through 3.    We also discussed that mild cases of carpal tunnel syndrome are often improved with night splints.  If symptoms persist it is very reasonable to consider a carpal tunnel injection.    If the patient does have moderate to severe carpal tunnel syndrome based on NCV, then it is certainly reasonable to consider surgical consultation for definitive management possible carpal tunnel release.  We also discussed his severe carpal tunnel syndrome can lead to permanent nerve impairment even if released. At this point, the patient like to proceed conservatively. Worsening pain will consider injection and follow-up  Worsening pain that is not responding to any other conservative therapy.  Patient actually does have evidence of weakness of the lower extremities.  We will get further imaging with advanced imaging.  Discussed with patient that we will see how patient responds.  Depending on the MRI we will consider the possibility of epidural.  Patient is in agreement with the plan  Updated 10/19/2021 Megan Valentine is a 71 y.o. female coming in with complaint of wrist and thumb pain. Epidural for her back is doing well. She is no where near the pain level she was before. Right wrist is doing ok. She would like an injection for her left thumb. No other complaints.        Past Medical History:  Diagnosis Date   Allergy    Arthritis    Asthma    Cataract    Chronic bronchitis ([place])    Chronic kidney disease    stage 3, kidney infections   Complication of anesthesia    hard to wake up   Diverticulosis    Environmental allergies    GERD (gastroesophageal reflux disease)    Hiatal hernia    History of chicken pox    Hyperlipidemia    Hypothyroidism    IBS (irritable bowel syndrome)    Migraines    Mitral valve prolapse    Per pt . per Dr.Henry  [name].  does not have MVP   Neuromuscular disorder ([place])    neuropathy feet   Pneumonia    Rectal prolapse    Past Surgical History:  Procedure Laterality Date   ABDOMINAL HYSTERECTOMY  1999   APPENDECTOMY  1962   BREAST CYST ASPIRATION Bilateral    CHOLECYSTECTOMY N/A 12/17/2018   Procedure: LAPAROSCOPIC CHOLECYSTECTOMY;  Surgeon: Clovis Riley, MD;  Location: [facility];  Service: General;  Laterality: N/A;   EYE SURGERY     cataract   JOINT REPLACEMENT     rt knee scope   REFRACTIVE SURGERY  2000   both eyes   retrocele repair     and bladder sling   TOTAL KNEE ARTHROPLASTY Right 03/24/2015   Procedure: TOTAL KNEE ARTHROPLASTY;  Surgeon: [name], MD;  Location: [facility];  Service: Orthopedics;  Laterality: Right;  TOTAL KNEE ARTHROPLASTY Left 11/17/2020   Procedure: LEFT TOTAL KNEE ARTHROPLASTY;  Surgeon: [name], MD;  Location: [facility];  Service: Orthopedics;  Laterality: Left;   TUBAL LIGATION  1977   Social History   Socioeconomic History   Marital status: Widowed    Spouse name: Not on file   Number of children: Not on file   Years of education: 12   Highest education level: Not on file  Occupational History   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    Employer: [employer]  Tobacco Use   Smoking status: Former    Types: Cigarettes    Quit date: 03/23/1976    Years since quitting: 45.6   Smokeless tobacco: Never  Vaping Use   Vaping Use:  Never used  Substance and Sexual Activity   Alcohol use: No   Drug use: No   Sexual activity: Yes    Birth control/protection: None, Post-menopausal  Other Topics Concern   Not on file  Social History Narrative   Caffeine Use-yes   Regular exercise-no   Right handed    Lives alone   Social Determinants of Health   Financial Resource Strain: Low Risk  (01/27/2021)   Overall Financial Resource Strain (CARDIA)    Difficulty of Paying Living Expenses: Not hard at all  Food Insecurity: No Food Insecurity (01/27/2021)   Hunger Vital Sign    Worried About Running Out of Food in the Last Year: Never true    [place] in the Last Year: Never true  Transportation Needs: No Transportation Needs (01/27/2021)   PRAPARE - Hydrologist (Medical): No    Lack of Transportation (Non-Medical): No  Physical Activity: Insufficiently Active (01/27/2021)   Exercise Vital Sign    Days of Exercise per Week: 3 days    Minutes of Exercise per Session: 20 min  Stress: No Stress Concern Present (01/27/2021)   [place]    Feeling of Stress : Not at all  Social Connections: Moderately Isolated (01/27/2021)   Social Connection and Isolation Panel [NHANES]    Frequency of Communication with Friends and Family: Twice a week    Frequency of Social Gatherings with Friends and Family: Twice a week    Attends Religious Services: More than 4 times per year    Active Member of [employer] or Organizations: No    Attends Archivist Meetings: Never    Marital Status: Widowed   Allergies  Allergen Reactions   Tetracycline Hives and Itching   Dilaudid [Hydromorphone Hcl] Nausea And Vomiting   Other     Cigarette smoke and perfumes--flares asthma (develops bronchitis)   Family History  Problem Relation Age of Onset   Stomach cancer Maternal Grandmother    Hypertension Maternal Grandmother    Diabetes Maternal  Grandmother    Stroke Maternal Grandmother    Cancer Maternal Grandmother        Stomach cancer   Colon cancer Paternal Grandfather    Heart attack Paternal Grandfather    Hyperlipidemia Mother    Diabetes Mother    Hypertension Mother    Alzheimer's disease Mother    Diabetes Father    Hyperlipidemia Father    Heart disease Father    Heart attack Father    Stroke Paternal Grandmother     Current Outpatient Medications (Endocrine & Metabolic):    estradiol (ESTRACE) 0.5 MG tablet, Taper as  directed. No further refills.   levothyroxine (SYNTHROID) 112 MCG tablet, Take 1 tablet (112 mcg total) by mouth daily before breakfast.   Current Outpatient Medications (Respiratory):    albuterol (VENTOLIN HFA) 108 (90 Base) MCG/ACT inhaler, Inhale into the lungs every 6 (six) hours as needed for wheezing or shortness of breath.   fluticasone (FLOVENT HFA) 44 MCG/ACT inhaler, Inhale 2 puffs into the lungs 2 (two) times daily.   levocetirizine (XYZAL) 5 MG tablet, Take 1 tablet (5 mg total) by mouth every evening.  Current Outpatient Medications (Analgesics):    acetaminophen (TYLENOL) 325 MG tablet, Take 2 tablets (650 mg total) by mouth every 6 (six) hours as needed for moderate pain.   aspirin 81 MG chewable tablet, Chew 1 tablet (81 mg total) by mouth 2 (two) times daily.   Current Outpatient Medications (Other):    diclofenac Sodium (VOLTAREN ARTHRITIS PAIN) 1 % GEL, Apply a small grape sized dollop 1 disorder 2 weeks of pends up to 4 times daily as needed   gabapentin (NEURONTIN) 300 MG capsule, TAKE 1 CAPSULE(300 MG) BY MOUTH TWICE DAILY FOR 7 DAYS THEN TAKE 1 CAPSULE(300 MG) BY MOUTH THREE TIMES DAILY FOR 23 DAYS   hydroxypropyl methylcellulose / hypromellose (ISOPTO TEARS / GONIOVISC) 2.5 % ophthalmic solution, Place 1 drop into both eyes 4 (four) times daily as needed for dry eyes.   Multiple Vitamins-Minerals (HAIR/SKIN/NAILS/BIOTIN PO), Take 3 tablets by mouth in the morning.    pantoprazole (PROTONIX) 20 MG tablet, TAKE 1 TABLET(20 MG) BY MOUTH DAILY   potassium chloride SA (KLOR-CON M) 20 MEQ tablet, Take 1 tablet (20 mEq total) by mouth daily.   Reviewed prior external information including notes and imaging from  primary care provider As well as notes that were available from care everywhere and other healthcare systems.  Past medical history, social, surgical and family history all reviewed in electronic medical record.  No pertanent information unless stated regarding to the chief complaint.   Review of Systems:  No headache, visual changes, nausea, vomiting, diarrhea, constipation, dizziness, abdominal pain, skin rash, fevers, chills, night sweats, weight loss, swollen lymph nodes, body aches, joint swelling, chest pain, shortness of breath, mood changes. POSITIVE muscle aches  Objective  Blood pressure 126/80, pulse 86, height '5\' 4"'$  (1.626 m), weight 167 lb (75.8 kg), SpO2 95 %.   General: No apparent distress alert and oriented x3 mood and affect normal, dressed appropriately.  HEENT: Pupils equal, extraocular movements intact  Respiratory: Patient's speak in full sentences and does not appear short of breath  Cardiovascular: No lower extremity edema, non tender, no erythema  Patient does have a positive CMC grind test. Patient does have some thenar eminence wasting noted. Right wrist does have good range of motion with good grip strength.  Procedure: Real-time Ultrasound Guided Injection of left CMC joint Device: GE Logiq Q7 Ultrasound guided injection is preferred based studies that show increased duration, increased effect, greater accuracy, decreased procedural pain, increased response rate, and decreased cost with ultrasound guided versus blind injection.  Verbal informed consent obtained.  Time-out conducted.  Noted no overlying erythema, induration, or other signs of local infection.  Skin prepped in a sterile fashion.  Local anesthesia:  Topical Ethyl chloride.  With sterile technique and under real time ultrasound guidance: With a 25-gauge half inch needle injecting 0.5 cc of 0.5% Marcaine and 0.5 cc of Kenalog 40 mg per. Completed without difficulty  Pain immediately resolved suggesting accurate placement of the medication.  Advised  to call if fevers/chills, erythema, induration, drainage, or persistent bleeding.  Impression: Technically successful ultrasound guided injection.     Impression and Recommendations:      The above documentation has been reviewed and is accurate and complete Lyndal Pulley, DO

## 2021-10-19 ENCOUNTER — Ambulatory Visit: Payer: PPO | Admitting: Family Medicine

## 2021-10-19 ENCOUNTER — Ambulatory Visit: Payer: Self-pay

## 2021-10-19 VITALS — BP 126/80 | HR 86 | Ht 64.0 in | Wt 167.0 lb

## 2021-10-19 DIAGNOSIS — M189 Osteoarthritis of first carpometacarpal joint, unspecified: Secondary | ICD-10-CM

## 2021-10-19 DIAGNOSIS — M25531 Pain in right wrist: Secondary | ICD-10-CM

## 2021-10-19 DIAGNOSIS — M180 Bilateral primary osteoarthritis of first carpometacarpal joints: Secondary | ICD-10-CM | POA: Diagnosis not present

## 2021-10-19 NOTE — Patient Instructions (Signed)
Injection in thumb today Follow up when needed for back and wrist pain

## 2021-10-19 NOTE — Assessment & Plan Note (Signed)
Patient given injection and tolerated the procedure well, discussed icing regimen and home exercises, which activities to do which ones to avoid, discussed the Exos brace with patient having severe arthritic changes noted.  Does have some weakness noted as well.

## 2021-11-02 ENCOUNTER — Ambulatory Visit: Payer: PPO | Admitting: Nurse Practitioner

## 2021-11-02 ENCOUNTER — Encounter: Payer: Self-pay | Admitting: Nurse Practitioner

## 2021-11-02 VITALS — BP 122/74 | HR 57 | Ht 64.0 in | Wt 168.2 lb

## 2021-11-02 DIAGNOSIS — K219 Gastro-esophageal reflux disease without esophagitis: Secondary | ICD-10-CM | POA: Diagnosis not present

## 2021-11-02 DIAGNOSIS — Z1211 Encounter for screening for malignant neoplasm of colon: Secondary | ICD-10-CM

## 2021-11-02 MED ORDER — NA SULFATE-K SULFATE-MG SULF 17.5-3.13-1.6 GM/177ML PO SOLN: 1.0000 | Freq: Once | ORAL | 0 refills | Status: AC

## 2021-11-02 NOTE — Progress Notes (Signed)
11/02/2021 Megan Valentine [id number] Dec 30, 1950   CHIEF COMPLAINT: Discuss scheduling an EGD and colonoscopy   HISTORY OF PRESENT ILLNESS: Megan Valentine is a 71 year old female with a past medical history of arthritis, asthma, migraine headaches, CKD stage III, GERD, diverticulosis and a hyperplastic rectal polyp per colonoscopy in 2000.  Past appendectomy age 59, hysterectomy 1999, cholecystectomy 11/2018, rectocele repair 08/2018, right and left knee replacement surgery.  She presents today for further evaluation regarding worsening reflux symptoms x 3 months and she wishes to schedule an EGD and colonoscopy.  Her most recent EGD by Dr. Sharlett Iles was 04/2011 which identified a 4 cm hiatal hernia and her esophagus was empirically dilated.  She remained on Pantoprazole 40 mg once daily until the end of August 2023 when she had worsening heartburn and acid burps.  She felt the Pantoprazole was no longer working so she switched to Famotidine 20 mg once daily OTC.  She takes calcium carbonate 3 to 4 tablets a few days weekly as needed.  She sometimes awakens in the middle the night and has brief difficulty swallowing her saliva otherwise no dysphagia.  She has chronic constipation for which she takes 4 Colace stool softeners and 6 senna tablets daily which results in passing a soft bowel movement daily.  No rectal bleeding or black stools.  Her most recent colonoscopy was 03/2011 which showed diverticulosis to the left colon, melanosis throughout the colon without evidence of colon polyps.  She was advised to repeat a colonoscopy in 10 years.  Paternal grandfather had colon cancer.  She takes ASA 81 mg daily.  No other NSAIDs.  She awakened from anesthesia during her right knee surgery in 2017.  No problems with sedation/MAC use with her prior endoscopic procedures.     Latest Ref Rng & Units 11/17/2020   12:53 PM 11/04/2020   11:51 AM 03/30/2020    3:41 PM  CBC  WBC 4.0 - 10.5 K/uL 7.9  7.5  8.7    Hemoglobin 12.0 - 15.0 g/dL 13.5  14.4  14.5   Hematocrit 36.0 - 46.0 % 43.5  43.3  42.7   Platelets 150 - 400 K/uL 317  345  356.0        Latest Ref Rng & Units 07/06/2021   11:56 AM 05/13/2021    4:57 PM 12/30/2020    9:19 AM  CMP  Glucose 70 - 99 mg/dL 92  105  94   BUN 6 - 23 mg/dL _0 Creatinine 0.40 - 1.20 mg/dL 0.99  0.88  0.95   Sodium 135 - 145 mEq/L 141  141  141   Potassium 3.5 - 5.1 mEq/L 3.8  3.3  3.3   Chloride 96 - 112 mEq/L 106  108  105   CO2 19 - 32 mEq/L _1 Calcium 8.4 - 10.5 mg/dL 9.4  8.8  9.3   Total Protein 6.0 - 8.3 g/dL  6.8    Total Bilirubin 0.2 - 1.2 mg/dL  0.3    Alkaline Phos 39 - 117 U/L  65    AST 0 - 37 U/L  18    ALT 0 - 35 U/L  12      ECHO 05/02/2019: 1. Left ventricular ejection fraction, by estimation, is 60 to 65%. The left ventricle has normal function. The left ventricle has no regional wall motion abnormalities. Left ventricular diastolic parameters are consistent with Grade  I diastolic dysfunction (impaired relaxation). 2. Right ventricular systolic function is normal. The right ventricular size is normal. The estimated right ventricular systolic pressure is 10.2 mmHg. 3. The mitral valve is normal in structure. No evidence of mitral valve regurgitation. No evidence of mitral stenosis. 4. The aortic valve is tricuspid. Aortic valve regurgitation is not visualized. No aortic stenosis is present. 5. The inferior vena cava is normal in size with greater than 50% respiratory variability, suggesting right atrial pressure of 3 mmHg. 6. 2.5 x 2.8 cyst in left lobe of liver.  PAST GI PROCEDURES:  EGD 04/27/2011: 4cm hiatal hernia  Esophagus dilated with a 59F Maloney dilator  Colonoscopy 04/06/2011: Melanosis throughout the colon No polyps or cancers Diverticulosis, mild, left-sided diverticulosis  EGD 01/06/2003: 3 cm hiatal hernia  Colonoscopy 02/07/2001: Diverticulosis Internal hemorrhoids  Colonoscopy  04/20/1998: 5 mm hyperplastic rectal polyp  Past Medical History:  Diagnosis Date   Allergy    Arthritis    Asthma    Cataract    Chronic bronchitis ([facility])    Chronic kidney disease    stage 3, kidney infections   Complication of anesthesia    hard to wake up   Diverticulosis    Environmental allergies    GERD (gastroesophageal reflux disease)    Hiatal hernia    History of chicken pox    Hyperlipidemia    Hypothyroidism    IBS (irritable bowel syndrome)    Migraines    Mitral valve prolapse    Per pt . per Dr.Henry  [name].  does not have MVP   Neuromuscular disorder ([place])    neuropathy feet   Pneumonia    Rectal prolapse    Past Surgical History:  Procedure Laterality Date   ABDOMINAL HYSTERECTOMY  1999   APPENDECTOMY  1962   BREAST CYST ASPIRATION Bilateral    CHOLECYSTECTOMY N/A 12/17/2018   Procedure: LAPAROSCOPIC CHOLECYSTECTOMY;  Surgeon: Clovis Riley, MD;  Location: [facility];  Service: General;  Laterality: N/A;   EYE SURGERY     cataract   JOINT REPLACEMENT     rt knee scope   REFRACTIVE SURGERY  2000   both eyes   retrocele repair     and bladder sling   TOTAL KNEE ARTHROPLASTY Right 03/24/2015   Procedure: TOTAL KNEE ARTHROPLASTY;  Surgeon: [name], MD;  Location: [facility];  Service: Orthopedics;  Laterality: Right;   TOTAL KNEE ARTHROPLASTY Left 11/17/2020   Procedure: LEFT TOTAL KNEE ARTHROPLASTY;  Surgeon: [name], MD;  Location: [facility];  Service: Orthopedics;  Laterality: Left;   TUBAL LIGATION  1977   Social History:  reports that she quit smoking about 45 years ago. Her smoking use included cigarettes. She has never used smokeless tobacco. She reports that she does not drink alcohol and does not use drugs.  Family History: family history includes Alzheimer's disease in her mother; Cancer in her maternal grandmother; Colon cancer in her paternal grandfather; Diabetes in her father, maternal grandmother, and mother; Heart attack  in her father and paternal grandfather; Heart disease in her father; Hyperlipidemia in her father and mother; Hypertension in her maternal grandmother and mother; Stomach cancer in her maternal grandmother; Stroke in her maternal grandmother and paternal grandmother.   Allergies  Allergen Reactions   Tetracycline Hives and Itching   Dilaudid [Hydromorphone Hcl] Nausea And Vomiting   Other     Cigarette smoke and perfumes--flares asthma (develops bronchitis)      Outpatient Encounter Medications as  of 11/02/2021  Medication Sig   acetaminophen (TYLENOL) 325 MG tablet Take 2 tablets (650 mg total) by mouth every 6 (six) hours as needed for moderate pain.   albuterol (VENTOLIN HFA) 108 (90 Base) MCG/ACT inhaler Inhale into the lungs every 6 (six) hours as needed for wheezing or shortness of breath.   aspirin 81 MG chewable tablet Chew 1 tablet (81 mg total) by mouth 2 (two) times daily.   diclofenac Sodium (VOLTAREN ARTHRITIS PAIN) 1 % GEL Apply a small grape sized dollop 1 disorder 2 weeks of pends up to 4 times daily as needed   estradiol (ESTRACE) 0.5 MG tablet Taper as directed. No further refills.   fluticasone (FLOVENT HFA) 44 MCG/ACT inhaler Inhale 2 puffs into the lungs 2 (two) times daily.   gabapentin (NEURONTIN) 300 MG capsule TAKE 1 CAPSULE(300 MG) BY MOUTH TWICE DAILY FOR 7 DAYS THEN TAKE 1 CAPSULE(300 MG) BY MOUTH THREE TIMES DAILY FOR 23 DAYS   hydroxypropyl methylcellulose / hypromellose (ISOPTO TEARS / GONIOVISC) 2.5 % ophthalmic solution Place 1 drop into both eyes 4 (four) times daily as needed for dry eyes.   levocetirizine (XYZAL) 5 MG tablet Take 1 tablet (5 mg total) by mouth every evening.   levothyroxine (SYNTHROID) 112 MCG tablet Take 1 tablet (112 mcg total) by mouth daily before breakfast.   Multiple Vitamins-Minerals (HAIR/SKIN/NAILS/BIOTIN PO) Take 3 tablets by mouth in the morning.   pantoprazole (PROTONIX) 20 MG tablet TAKE 1 TABLET(20 MG) BY MOUTH DAILY    potassium chloride SA (KLOR-CON M) 20 MEQ tablet Take 1 tablet (20 mEq total) by mouth daily.   No facility-administered encounter medications on file as of 11/02/2021.    REVIEW OF SYSTEMS:  Gen: Denies fever, sweats or chills. No weight loss.  CV: Denies chest pain, palpitations or edema. Resp: Denies cough, shortness of breath of hemoptysis.  GI: See HPI.  GU : Denies urinary burning, blood in urine, increased urinary frequency or incontinence. MS: + Arthritis and back pain.  Derm: Denies rash, itchiness, skin lesions or unhealing ulcers. Psych: Denies depression, anxiety or memory loss.  Heme: Denies bruising, easy bleeding. Neuro:  Denies headaches, dizziness or paresthesias. Endo:  Denies any problems with DM, thyroid or adrenal function.  PHYSICAL EXAM: BP 122/74   Pulse (!) 57   Ht _0  (1.626 m)   Wt 168 lb 4 oz (76.3 kg)   BMI 28.88 kg/m   General: 71 year old female in no acute distress. Head: Normocephalic and atraumatic. Eyes:  Sclerae non-icteric, conjunctive pink. Ears: Normal auditory acuity. Mouth: Dentition intact. No ulcers or lesions.  Neck: Supple, no lymphadenopathy or thyromegaly.  Lungs: Clear bilaterally to auscultation without wheezes, crackles or rhonchi. Heart: Regular rate and rhythm. No murmur, rub or gallop appreciated.  Abdomen: Soft, nontender, non distended. No masses. No hepatosplenomegaly. Normoactive bowel sounds x 4 quadrants.  Rectal: Deferred. Musculoskeletal: Symmetrical with no gross deformities. Skin: Warm and dry. No rash or lesions on visible extremities. Extremities: No edema. Neurological: Alert oriented x 4, no focal deficits.  Psychological:  Alert and cooperative. Normal mood and affect.  ASSESSMENT AND PLAN:  42) 71 year old female with a history of a hiatal hernia and GERD symptoms.  Worsening reflux symptoms x 3 months. Off PPI and started Famotidine 72m QD end of August 2023.  -EGD to rule out reflux  esophagitis/Barrett's esophagus and to reassess her hiatal hernia -Maintain a GERD diet -Increase Famotidine 20 mg p.o. twice daily -Patient to contact office if symptoms  worsen  2) History of a hyperplastic rectal polyp 03/2018.  Paternal grandfather with history of colon cancer.  Her most recent colonoscopy 03/2011 showed melanosis throughout the colon, left-sided diverticulosis and no polyps. -Colonoscopy benefits and risks discussed including risk with sedation, risk of bleeding, perforation and infection   3) Chronic constipation well-controlled on 4 stool softeners daily and 6 senna tablets daily  4) CKD, stable -Patient instructed to hydrate well for several days prior to her colonoscopy date.  Avoid dehydration.       CC:  Libby Maw,*

## 2021-11-02 NOTE — Patient Instructions (Addendum)
If you are age 71 or older, your body mass index should be between 23-30. Your Body mass index is 28.88 kg/m. If this is out of the aforementioned range listed, please consider follow up with your Primary Care Provider.  If you are age 80 or younger, your body mass index should be between 19-25. Your Body mass index is 28.88 kg/m. If this is out of the aformentioned range listed, please consider follow up with your Primary Care Provider.   ________________________________________________________  The [facility] providers would like to encourage you to use [facility] to communicate with providers for non-urgent requests or questions.  Due to long hold times on the telephone, sending your provider a message by [facility] may be a faster and more efficient way to get a response.  Please allow 48 business hours for a response.  Please remember that this is for non-urgent requests.  _______________________________________________________   Megan Valentine have been scheduled for an endoscopy and colonoscopy. Please follow the written instructions given to you at your visit today. Please pick up your prep supplies at the pharmacy within the next 1-3 days. If you use inhalers (even only as needed), please bring them with you on the day of your procedure.   Increase Famotidine 20 mg 1 tablet twice daily.   Due to recent changes in healthcare laws, you may see the results of your imaging and laboratory studies on MyChart before your provider has had a chance to review them.  We understand that in some cases there may be results that are confusing or concerning to you. Not all laboratory results come back in the same time frame and the provider may be waiting for multiple results in order to interpret others.  Please give [place] 48 hours in order for your provider to thoroughly review all the results before contacting the office for clarification of your results.    It was a pleasure to see you today!  Thank you for trusting  me with your gastrointestinal care!

## 2021-11-03 NOTE — Progress Notes (Signed)
Agree with the assessment and plan as outlined by Colleen Kennedy-Smith, NP.    Lily Kernen E. Perian Tedder, MD  Gastroenterology  

## 2021-11-09 ENCOUNTER — Encounter: Payer: Self-pay | Admitting: Family Medicine

## 2021-11-09 ENCOUNTER — Ambulatory Visit: Payer: PPO | Admitting: Orthopaedic Surgery

## 2021-11-09 NOTE — Telephone Encounter (Signed)
Megan Valentine, patient previously took Protonix which she assessed was no longer effective.  Please send in Rx for as Esomeprazole 40 mg once daily to be taken 30 minutes before breakfast # 90, 1 refills.  She can continue taking famotidine 20 mg twice daily.  Patient to contact office if her symptoms worsen.  She can also take Gaviscon 1 tablespoon p.o. 3 times daily as needed.  THX.

## 2021-11-10 ENCOUNTER — Other Ambulatory Visit: Payer: Self-pay

## 2021-11-10 DIAGNOSIS — K219 Gastro-esophageal reflux disease without esophagitis: Secondary | ICD-10-CM

## 2021-11-10 MED ORDER — ESOMEPRAZOLE MAGNESIUM 40 MG PO CPDR: 40.0000 mg | DELAYED_RELEASE_CAPSULE | Freq: Every day | ORAL | 1 refills | Status: DC

## 2021-11-30 ENCOUNTER — Encounter: Payer: Self-pay | Admitting: Gastroenterology

## 2021-11-30 ENCOUNTER — Ambulatory Visit (AMBULATORY_SURGERY_CENTER): Payer: PPO | Admitting: Gastroenterology

## 2021-11-30 VITALS — BP 111/76 | HR 75 | Temp 96.8°F | Resp 14 | Ht 64.0 in | Wt 168.0 lb

## 2021-11-30 DIAGNOSIS — K449 Diaphragmatic hernia without obstruction or gangrene: Secondary | ICD-10-CM

## 2021-11-30 DIAGNOSIS — N183 Chronic kidney disease, stage 3 unspecified: Secondary | ICD-10-CM | POA: Diagnosis not present

## 2021-11-30 DIAGNOSIS — D123 Benign neoplasm of transverse colon: Secondary | ICD-10-CM

## 2021-11-30 DIAGNOSIS — Z1211 Encounter for screening for malignant neoplasm of colon: Secondary | ICD-10-CM | POA: Diagnosis not present

## 2021-11-30 DIAGNOSIS — K219 Gastro-esophageal reflux disease without esophagitis: Secondary | ICD-10-CM | POA: Diagnosis not present

## 2021-11-30 DIAGNOSIS — D122 Benign neoplasm of ascending colon: Secondary | ICD-10-CM

## 2021-11-30 DIAGNOSIS — E669 Obesity, unspecified: Secondary | ICD-10-CM | POA: Diagnosis not present

## 2021-11-30 DIAGNOSIS — J449 Chronic obstructive pulmonary disease, unspecified: Secondary | ICD-10-CM | POA: Diagnosis not present

## 2021-11-30 MED ORDER — SODIUM CHLORIDE 0.9 % IV SOLN: 500.0000 mL | Freq: Once | INTRAVENOUS | Status: DC

## 2021-11-30 NOTE — Progress Notes (Signed)
Called to room to assist during endoscopic procedure.  Patient ID and intended procedure confirmed with present staff. Received instructions for my participation in the procedure from the performing physician.  

## 2021-11-30 NOTE — Progress Notes (Signed)
Report to PACU, RN, vss, BBS= Clear.  

## 2021-11-30 NOTE — Progress Notes (Signed)
VS completed by DT.  Pt's states no medical or surgical changes since previsit or office visit.  

## 2021-11-30 NOTE — Progress Notes (Signed)
History and Physical Interval Note:  11/30/2021 1:11 PM  Megan Valentine  has presented today for endoscopic procedure(s), with the diagnosis of  Encounter Diagnoses  Name Primary?   Gastroesophageal reflux disease without esophagitis Yes   Colon cancer screening   .  The various methods of evaluation and treatment have been discussed with the patient and/or family. After consideration of risks, benefits and other options for treatment, the patient has consented to  the endoscopic procedure(s).   The patient's history has been reviewed, patient examined, no change in status, stable for endoscopic procedure(s).  I have reviewed the patient's chart and labs.  Questions were answered to the patient's satisfaction.    Patient's symptoms have improved significantly since starting Nexium qam (also taking Pepcid BID)  Megan Rodgers E. Candis Schatz, MD [facility]

## 2021-11-30 NOTE — Patient Instructions (Signed)
Please read handouts provided. Continue present medications. Await pathology results. Anti-reflux precautions/lifestytle modifications.   YOU HAD AN ENDOSCOPIC PROCEDURE TODAY AT [facility]:   Refer to the procedure report that was given to you for any specific questions about what was found during the examination.  If the procedure report does not answer your questions, please call your gastroenterologist to clarify.  If you requested that your care partner not be given the details of your procedure findings, then the procedure report has been included in a sealed envelope for you to review at your convenience later.  YOU SHOULD EXPECT: Some feelings of bloating in the abdomen. Passage of more gas than usual.  Walking can help get rid of the air that was put into your GI tract during the procedure and reduce the bloating. If you had a lower endoscopy (such as a colonoscopy or flexible sigmoidoscopy) you may notice spotting of blood in your stool or on the toilet paper. If you underwent a bowel prep for your procedure, you may not have a normal bowel movement for a few days.  Please Note:  You might notice some irritation and congestion in your nose or some drainage.  This is from the oxygen used during your procedure.  There is no need for concern and it should clear up in a day or so.  SYMPTOMS TO REPORT IMMEDIATELY:  Following lower endoscopy (colonoscopy or flexible sigmoidoscopy):  Excessive amounts of blood in the stool  Significant tenderness or worsening of abdominal pains  Swelling of the abdomen that is new, acute  Fever of 100F or higher  Following upper endoscopy (EGD)  Vomiting of blood or coffee ground material  New chest pain or pain under the shoulder blades  Painful or persistently difficult swallowing  New shortness of breath  Fever of 100F or higher  Black, tarry-looking stools  For urgent or emergent issues, a gastroenterologist can be reached at  any hour by calling [phone]. Do not use MyChart messaging for urgent concerns.    DIET:  We do recommend a small meal at first, but then you may proceed to your regular diet.  Drink plenty of fluids but you should avoid alcoholic beverages for 24 hours.  ACTIVITY:  You should plan to take it easy for the rest of today and you should NOT DRIVE or use heavy machinery until tomorrow (because of the sedation medicines used during the test).    FOLLOW UP: Our staff will call the number listed on your records the next business day following your procedure.  We will call around 7:15- 8:00 am to check on you and address any questions or concerns that you may have regarding the information given to you following your procedure. If we do not reach you, we will leave a message.     If any biopsies were taken you will be contacted by phone or by letter within the next 1-3 weeks.  Please call us at [phone] if you have not heard about the biopsies in 3 weeks.    SIGNATURES/CONFIDENTIALITY: You and/or your care partner have signed paperwork which will be entered into your electronic medical record.  These signatures attest to the fact that that the information above on your After Visit Summary has been reviewed and is understood.  Full responsibility of the confidentiality of this discharge information lies with you and/or your care-partner.

## 2021-11-30 NOTE — Op Note (Signed)
[facility] Patient Name: Megan Valentine Procedure Date: 11/30/2021 1:13 PM MRN: [id number] Endoscopist: Nicki Reaper E. Candis Schatz , MD, [id number] Age: 71 Referring MD:  Date of Birth: 07-Sep-1950 Gender: Female Account #: [id number] Procedure:                Upper GI endoscopy Indications:              Esophageal reflux symptoms that persist despite                            appropriate therapy (symptoms persistent on                            Protonix, recently switched to Nexium + Pepcid with                            good symptom control) Medicines:                Monitored Anesthesia Care Procedure:                Pre-Anesthesia Assessment:                           - Prior to the procedure, a History and Physical                            was performed, and patient medications and                            allergies were reviewed. The patient's tolerance of                            previous anesthesia was also reviewed. The risks                            and benefits of the procedure and the sedation                            options and risks were discussed with the patient.                            All questions were answered, and informed consent                            was obtained. Prior Anticoagulants: The patient has                            taken no anticoagulant or antiplatelet agents                            except for aspirin. ASA Grade Assessment: II - A                            patient with mild systemic disease. After reviewing  the risks and benefits, the patient was deemed in                            satisfactory condition to undergo the procedure.                           After obtaining informed consent, the endoscope was                            passed under direct vision. Throughout the                            procedure, the patient's blood pressure, pulse, and                            oxygen  saturations were monitored continuously. The                            Endoscope was introduced through the mouth, and                            advanced to the second part of duodenum. The upper                            GI endoscopy was accomplished without difficulty.                            The patient tolerated the procedure well. Scope In: Scope Out: Findings:                 The examined esophagus was normal.                           The gastroesophageal flap valve was visualized                            endoscopically and classified as Hill Grade IV (no                            fold, wide open lumen, hiatal hernia present).                           A 3 cm hiatal hernia was present.                           The entire examined stomach was normal.                           The examined duodenum was normal. Complications:            No immediate complications. Estimated Blood Loss:     Estimated blood loss: none. Impression:               - Normal esophagus.                           -  Gastroesophageal flap valve classified as Hill                            Grade IV (no fold, wide open lumen, hiatal hernia                            present).                           - 3 cm hiatal hernia.                           - Normal stomach.                           - Normal examined duodenum.                           - No specimens collected. Recommendation:           - Patient has a contact number available for                            emergencies. The signs and symptoms of potential                            delayed complications were discussed with the                            patient. Return to normal activities tomorrow.                            Written discharge instructions were provided to the                            patient.                           - Resume previous diet.                           - Continue present medications. Consider decreasing                             Pepcid to nightly dosing only                           - Recommend anti-reflux precautions/lifestyle                            modifications Jaice Digioia E. Candis Schatz, MD 11/30/2021 2:11:35 PM This report has been signed electronically.

## 2021-11-30 NOTE — Op Note (Signed)
[facility] Patient Name: Megan Valentine Procedure Date: 11/30/2021 1:12 PM MRN: [id number] Endoscopist: Nicki Reaper E. Candis Schatz , MD, [id number] Age: 71 Referring MD:  Date of Birth: Jun 29, 1950 Gender: Female Account #: [id number] Procedure:                Colonoscopy Indications:              Screening for colorectal malignant neoplasm (last                            colonoscopy was 10 years ago) Medicines:                Monitored Anesthesia Care Procedure:                Pre-Anesthesia Assessment:                           - Prior to the procedure, a History and Physical                            was performed, and patient medications and                            allergies were reviewed. The patient's tolerance of                            previous anesthesia was also reviewed. The risks                            and benefits of the procedure and the sedation                            options and risks were discussed with the patient.                            All questions were answered, and informed consent                            was obtained. Prior Anticoagulants: The patient has                            taken no anticoagulant or antiplatelet agents                            except for aspirin. ASA Grade Assessment: II - A                            patient with mild systemic disease. After reviewing                            the risks and benefits, the patient was deemed in                            satisfactory condition to undergo the procedure.  After obtaining informed consent, the colonoscope                            was passed under direct vision. Throughout the                            procedure, the patient's blood pressure, pulse, and                            oxygen saturations were monitored continuously. The                            CF [id number] #[id number] was introduced through the anus                            and  advanced to the the cecum, identified by                            appendiceal orifice and ileocecal valve. The                            colonoscopy was somewhat difficult due to                            significant looping and a tortuous colon.                            Successful completion of the procedure was aided by                            using manual pressure. The patient tolerated the                            procedure well. The quality of the bowel                            preparation was good. The ileocecal valve,                            appendiceal orifice, and rectum were photographed.                            The bowel preparation used was SUPREP via split                            dose instruction. Scope In: 1:27:56 PM Scope Out: 2:02:21 PM Scope Withdrawal Time: 0 hours 27 minutes 51 seconds  Total Procedure Duration: 0 hours 34 minutes 25 seconds  Findings:                 The perianal and digital rectal examinations were                            normal. Pertinent negatives include normal  sphincter tone and no palpable rectal lesions.                           Five flat and sessile polyps were found in the                            ascending colon. The polyps were 4 to 12 mm in                            size. These polyps were removed with a cold snare.                            Resection and retrieval were complete. Estimated                            blood loss was minimal.                           A 6 mm polyp was found in the hepatic flexure. The                            polyp was sessile. The polyp was removed with a                            cold snare. Resection and retrieval were complete.                            Estimated blood loss was minimal.                           Two sessile polyps were found in the transverse                            colon. The polyps were 4 to 5 mm in size. These                             polyps were removed with a cold snare. Resection                            and retrieval were complete. Estimated blood loss                            was minimal.                           Multiple medium-mouthed diverticula were found in                            the sigmoid colon, descending colon and transverse                            colon.  A diffuse area of moderate melanosis was found in                            the entire colon.                           The exam was otherwise normal throughout the                            examined colon.                           Non-bleeding internal hemorrhoids were found during                            retroflexion. The hemorrhoids were Grade I                            (internal hemorrhoids that do not prolapse).                           No additional abnormalities were found on                            retroflexion. Complications:            No immediate complications. Estimated Blood Loss:     Estimated blood loss was minimal. Impression:               - Five 4 to 12 mm polyps in the ascending colon,                            removed with a cold snare. Resected and retrieved.                           - One 6 mm polyp at the hepatic flexure, removed                            with a cold snare. Resected and retrieved.                           - Two 4 to 5 mm polyps in the transverse colon,                            removed with a cold snare. Resected and retrieved.                           - Diverticulosis in the sigmoid colon, in the                            descending colon and in the transverse colon.                           - Melanosis in the colon.                           -  Non-bleeding internal hemorrhoids. Recommendation:           - Patient has a contact number available for                            emergencies. The signs and symptoms of potential                             delayed complications were discussed with the                            patient. Return to normal activities tomorrow.                            Written discharge instructions were provided to the                            patient.                           - Resume previous diet.                           - Continue present medications.                           - Await pathology results.                           - Repeat colonoscopy (date not yet determined) for                            surveillance based on pathology results. Manon Banbury E. Candis Schatz, MD 11/30/2021 2:17:37 PM This report has been signed electronically.

## 2021-12-01 ENCOUNTER — Telehealth: Payer: Self-pay | Admitting: *Deleted

## 2021-12-01 NOTE — Telephone Encounter (Signed)
  Follow up Call-     11/30/2021   12:33 PM  Call back number  Post procedure Call Back phone  # [phone]  Permission to leave phone message Yes     Patient questions:  Do you have a fever, pain , or abdominal swelling? No. Pain Score  0 *  Have you tolerated food without any problems? Yes.    Have you been able to return to your normal activities? Yes.    Do you have any questions about your discharge instructions: Diet   No. Medications  No. Follow up visit  No.  Do you have questions or concerns about your Care? No.  Actions: * If pain score is 4 or above: No action needed, pain <4.

## 2021-12-06 ENCOUNTER — Encounter: Payer: Self-pay | Admitting: Gastroenterology

## 2021-12-06 NOTE — Progress Notes (Signed)
Megan Valentine,  All eight polyps that I removed during your recent procedure were completely benign but were proven to be "pre-cancerous" polyps that MAY have grown into cancers if they had not been removed.  Studies shows that at least 20% of women over age 71 and 30% of men over age 31 have pre-cancerous polyps.  Based on current nationally recognized surveillance guidelines, I recommend that you have a repeat colonoscopy in 3 years.   If you develop any new rectal bleeding, abdominal pain or significant bowel habit changes, please contact me before then.

## 2021-12-14 ENCOUNTER — Encounter: Payer: Self-pay | Admitting: Family Medicine

## 2021-12-20 ENCOUNTER — Other Ambulatory Visit: Payer: Self-pay

## 2021-12-20 ENCOUNTER — Telehealth: Payer: PPO | Admitting: Family

## 2021-12-20 DIAGNOSIS — J019 Acute sinusitis, unspecified: Secondary | ICD-10-CM | POA: Diagnosis not present

## 2021-12-20 MED ORDER — AMOXICILLIN-POT CLAVULANATE 875-125 MG PO TABS: 1.0000 | ORAL_TABLET | Freq: Two times a day (BID) | ORAL | 0 refills | Status: DC

## 2021-12-20 NOTE — Progress Notes (Signed)
Virtual Visit Consent   Megan Valentine, you are scheduled for a virtual visit with a [facility] provider today. Just as with appointments in the office, your consent must be obtained to participate. Your consent will be active for this visit and any virtual visit you may have with one of our providers in the next 365 days. If you have a MyChart account, a copy of this consent can be sent to you electronically.  As this is a virtual visit, video technology does not allow for your provider to perform a traditional examination. This may limit your provider's ability to fully assess your condition. If your provider identifies any concerns that need to be evaluated in person or the need to arrange testing (such as labs, EKG, etc.), we will make arrangements to do so. Although advances in technology are sophisticated, we cannot ensure that it will always work on either your end or our end. If the connection with a video visit is poor, the visit may have to be switched to a telephone visit. With either a video or telephone visit, we are not always able to ensure that we have a secure connection.  By engaging in this virtual visit, you consent to the provision of healthcare and authorize for your insurance to be billed (if applicable) for the services provided during this visit. Depending on your insurance coverage, you may receive a charge related to this service.  I need to obtain your verbal consent now. Are you willing to proceed with your visit today? Jeffifer B Cortinas has provided verbal consent on 12/20/2021 for a virtual visit (video or telephone). Evelina Dun, FNP  Date: 12/20/2021 7:26 PM  Virtual Visit via Video Note   I, Evelina Dun, connected with  Megan Valentine  ([id number], 1950/05/15) on 12/20/21 at  7:30 PM EST by a video-enabled telemedicine application and verified that I am speaking with the correct person using two identifiers.  Location: Patient: Virtual Visit Location Patient:  Home Provider: Virtual Visit Location Provider: Home Office   I discussed the limitations of evaluation and management by telemedicine and the availability of in person appointments. The patient expressed understanding and agreed to proceed.    History of Present Illness: Megan Valentine is a 71 y.o. who identifies as a female who was assigned female at birth, and is being seen today for congestion and cough.  HPI: Sinusitis This is a new problem. The current episode started 1 to 4 weeks ago. The problem has been gradually worsening since onset. There has been no fever. Her pain is at a severity of 7/10. The pain is moderate. Associated symptoms include congestion, coughing, headaches, sinus pressure, sneezing and a sore throat. Pertinent negatives include no shortness of breath or swollen glands. Past treatments include acetaminophen and oral decongestants. The treatment provided mild relief.    Problems:  Patient Active Problem List   Diagnosis Date Noted   Arthritis of carpometacarpal [facility]) joint of both thumbs 09/07/2021   Carpal tunnel syndrome of right wrist 09/06/2021   Primary osteoarthritis of right hand 09/06/2021   Neuropathy 05/13/2021   Venous insufficiency 05/13/2021   Low back pain 04/05/2021   Acquired leg length discrepancy 04/05/2021   Urinary urgency 01/14/2021   Acute cystitis without hematuria 01/14/2021   Macrocytosis 12/07/2020   S/P TKR (total knee replacement) using cement, left 11/17/2020   Chronic pain of right knee 09/30/2020   Medication side effect 09/30/2020   Statin intolerance 06/30/2020   Trigger point  of shoulder region, left 02/25/2020   B12 deficiency 08/15/2019   Elevated glucose 08/15/2019   Anemia 02/15/2019   Abdominal pain 12/14/2018   Sinus pressure 11/12/2018   History of 2019 novel coronavirus disease (COVID-19) 11/12/2018   Pleurisy 06/05/2018   Enterocele 06/05/2018   Rectocele 05/23/2018   Vaginal vault prolapse 05/23/2018   SUI  (stress urinary incontinence, female) 05/23/2018   Asthma 05/07/2018   Stage 3a chronic kidney disease ([place]) 01/15/2018   Edema 12/15/2017   Insomnia 11/20/2017   Seborrheic dermatitis 11/16/2017   Seasonal allergic rhinitis due to pollen 11/16/2017   Menopausal and female climacteric states 10/19/2017   Constipation 03/26/2015   Unilateral primary osteoarthritis, left knee 11/17/2014   Healthcare maintenance 04/25/2014   Hypokalemia 09/15/2011   Hypothyroidism 08/21/2008   Mixed hyperlipidemia 08/21/2008    Allergies:  Allergies  Allergen Reactions   Tetracycline Hives and Itching   Dilaudid [Hydromorphone Hcl] Nausea And Vomiting   Other     Cigarette smoke and perfumes--flares asthma (develops bronchitis)   Medications:  Current Outpatient Medications:    amoxicillin-clavulanate (AUGMENTIN) 875-125 MG tablet, Take 1 tablet by mouth 2 (two) times daily., Disp: 14 tablet, Rfl: 0   acetaminophen (TYLENOL) 325 MG tablet, Take 2 tablets (650 mg total) by mouth every 6 (six) hours as needed for moderate pain., Disp: 30 tablet, Rfl: 0   albuterol (VENTOLIN HFA) 108 (90 Base) MCG/ACT inhaler, Inhale into the lungs every 6 (six) hours as needed for wheezing or shortness of breath., Disp: , Rfl:    aspirin 81 MG chewable tablet, Chew 1 tablet (81 mg total) by mouth 2 (two) times daily., Disp: , Rfl:    Calcium Carbonate Antacid 1177 MG CHEW, Chew 1 tablet by mouth. As needed, Disp: , Rfl:    Cranberry 12600 MG CAPS, Take 1 tablet by mouth. daily, Disp: , Rfl:    diclofenac Sodium (VOLTAREN ARTHRITIS PAIN) 1 % GEL, Apply a small grape sized dollop 1 disorder 2 weeks of pends up to 4 times daily as needed (Patient not taking: Reported on 11/02/2021), Disp: 150 g, Rfl: 1   docusate sodium (COLACE) 250 MG capsule, Take 250 mg by mouth daily. Takes 4 daily, Disp: , Rfl:    esomeprazole (NEXIUM) 40 MG capsule, Take 1 capsule (40 mg total) by mouth daily. Take medication 30 minutes before  breakfast:, Disp: 90 capsule, Rfl: 1   estradiol (ESTRACE) 0.5 MG tablet, Taper as directed. No further refills., Disp: 30 tablet, Rfl: 0   famotidine (PEPCID) 20 mg, Inject 20 mg into the vein every 12 (twelve) hours., Disp: , Rfl:    fluticasone (FLOVENT HFA) 44 MCG/ACT inhaler, Inhale 2 puffs into the lungs 2 (two) times daily., Disp: 1 each, Rfl: 6   gabapentin (NEURONTIN) 300 MG capsule, TAKE 1 CAPSULE(300 MG) BY MOUTH TWICE DAILY FOR 7 DAYS THEN TAKE 1 CAPSULE(300 MG) BY MOUTH THREE TIMES DAILY FOR 23 DAYS, Disp: 90 capsule, Rfl: 3   hydroxypropyl methylcellulose / hypromellose (ISOPTO TEARS / GONIOVISC) 2.5 % ophthalmic solution, Place 1 drop into both eyes 4 (four) times daily as needed for dry eyes., Disp: , Rfl:    levocetirizine (XYZAL) 5 MG tablet, Take 1 tablet (5 mg total) by mouth every evening., Disp: 30 tablet, Rfl: 0   levothyroxine (SYNTHROID) 112 MCG tablet, Take 1 tablet (112 mcg total) by mouth daily before breakfast., Disp: 90 tablet, Rfl: 3   Multiple Vitamins-Minerals (HAIR/SKIN/NAILS/BIOTIN PO), Take 3 tablets by mouth in the morning.,  Disp: , Rfl:    potassium chloride SA (KLOR-CON M) 20 MEQ tablet, Take 1 tablet (20 mEq total) by mouth daily., Disp: 90 tablet, Rfl: 0   Sennosides 17.2 MG TABS, Take 1 tablet by mouth. Takes 6 times daily, Disp: , Rfl:   Observations/Objective: Patient is well-developed, well-nourished in no acute distress.  Resting comfortably  at home.  Head is normocephalic, atraumatic.  No labored breathing.  Speech is clear and coherent with logical content.  Patient is alert and oriented at baseline.    Assessment and Plan: 1. Acute sinusitis, recurrence not specified, unspecified location - amoxicillin-clavulanate (AUGMENTIN) 875-125 MG tablet; Take 1 tablet by mouth 2 (two) times daily.  Dispense: 14 tablet; Refill: 0  - Take meds as prescribed - Use a cool mist humidifier  -Use saline nose sprays frequently -Force fluids -For any cough  or congestion  Use plain Mucinex- regular strength or max strength is fine -For fever or aces or pains- take tylenol or ibuprofen. -Throat lozenges if help -Follow up if symptoms worsen or do not improve   Follow Up Instructions: I discussed the assessment and treatment plan with the patient. The patient was provided an opportunity to ask questions and all were answered. The patient agreed with the plan and demonstrated an understanding of the instructions.  A copy of instructions were sent to the patient via MyChart unless otherwise noted below.     The patient was advised to call back or seek an in-person evaluation if the symptoms worsen or if the condition fails to improve as anticipated.  Time:  I spent 7 minutes with the patient via telehealth technology discussing the above problems/concerns.    Evelina Dun, FNP

## 2022-01-04 ENCOUNTER — Ambulatory Visit (INDEPENDENT_AMBULATORY_CARE_PROVIDER_SITE_OTHER): Payer: PPO | Admitting: Family Medicine

## 2022-01-04 ENCOUNTER — Encounter: Payer: Self-pay | Admitting: Family Medicine

## 2022-01-04 ENCOUNTER — Other Ambulatory Visit: Payer: Self-pay

## 2022-01-04 VITALS — BP 130/78 | HR 72 | Temp 97.3°F | Ht 65.5 in | Wt 174.4 lb

## 2022-01-04 DIAGNOSIS — E782 Mixed hyperlipidemia: Secondary | ICD-10-CM

## 2022-01-04 DIAGNOSIS — E876 Hypokalemia: Secondary | ICD-10-CM | POA: Diagnosis not present

## 2022-01-04 DIAGNOSIS — E538 Deficiency of other specified B group vitamins: Secondary | ICD-10-CM

## 2022-01-04 DIAGNOSIS — N1831 Chronic kidney disease, stage 3a: Secondary | ICD-10-CM | POA: Diagnosis not present

## 2022-01-04 DIAGNOSIS — R7309 Other abnormal glucose: Secondary | ICD-10-CM | POA: Diagnosis not present

## 2022-01-04 DIAGNOSIS — E039 Hypothyroidism, unspecified: Secondary | ICD-10-CM

## 2022-01-04 DIAGNOSIS — G629 Polyneuropathy, unspecified: Secondary | ICD-10-CM

## 2022-01-04 DIAGNOSIS — M791 Myalgia, unspecified site: Secondary | ICD-10-CM | POA: Diagnosis not present

## 2022-01-04 DIAGNOSIS — M5416 Radiculopathy, lumbar region: Secondary | ICD-10-CM

## 2022-01-04 DIAGNOSIS — T466X5A Adverse effect of antihyperlipidemic and antiarteriosclerotic drugs, initial encounter: Secondary | ICD-10-CM | POA: Diagnosis not present

## 2022-01-04 LAB — URINALYSIS, ROUTINE W REFLEX MICROSCOPIC
Bilirubin Urine: NEGATIVE
Hgb urine dipstick: NEGATIVE
Ketones, ur: NEGATIVE
Leukocytes,Ua: NEGATIVE
Nitrite: NEGATIVE
RBC / HPF: NONE SEEN (ref 0–?)
Specific Gravity, Urine: 1.015 (ref 1.000–1.030)
Total Protein, Urine: NEGATIVE
Urine Glucose: NEGATIVE
Urobilinogen, UA: 0.2 (ref 0.0–1.0)
pH: 6 (ref 5.0–8.0)

## 2022-01-04 LAB — LIPID PANEL
Cholesterol: 223 mg/dL — ABNORMAL HIGH (ref 0–200)
HDL: 55.9 mg/dL (ref >39.00)
NonHDL: 167.03
Total CHOL/HDL Ratio: 4
Triglycerides: 261 mg/dL — ABNORMAL HIGH (ref 0.0–149.0)
VLDL: 52.2 mg/dL — ABNORMAL HIGH (ref 0.0–40.0)

## 2022-01-04 LAB — COMPREHENSIVE METABOLIC PANEL
ALT: 13 U/L (ref 0–35)
AST: 15 U/L (ref 0–37)
Albumin: 4.2 g/dL (ref 3.5–5.2)
Alkaline Phosphatase: 80 U/L (ref 39–117)
BUN: 15 mg/dL (ref 6–23)
CO2: 28 mEq/L (ref 19–32)
Calcium: 9.3 mg/dL (ref 8.4–10.5)
Chloride: 105 mEq/L (ref 96–112)
Creatinine, Ser: 0.93 mg/dL (ref 0.40–1.20)
GFR: 61.71 mL/min (ref >60.00)
Glucose, Bld: 93 mg/dL (ref 70–99)
Potassium: 3.9 mEq/L (ref 3.5–5.1)
Sodium: 140 mEq/L (ref 135–145)
Total Bilirubin: 0.5 mg/dL (ref 0.2–1.2)
Total Protein: 6.8 g/dL (ref 6.0–8.3)

## 2022-01-04 LAB — CBC
HCT: 41.5 % (ref 36.0–46.0)
Hemoglobin: 14.2 g/dL (ref 12.0–15.0)
MCHC: 34.1 g/dL (ref 30.0–36.0)
MCV: 95 fl (ref 78.0–100.0)
Platelets: 315 10*3/uL (ref 150.0–400.0)
RBC: 4.37 Mil/uL (ref 3.87–5.11)
RDW: 13.5 % (ref 11.5–15.5)
WBC: 5.9 10*3/uL (ref 4.0–10.5)

## 2022-01-04 LAB — VITAMIN B12: Vitamin B-12: 712 pg/mL (ref 211–911)

## 2022-01-04 LAB — LDL CHOLESTEROL, DIRECT: Direct LDL: 133 mg/dL

## 2022-01-04 LAB — HEMOGLOBIN A1C: Hgb A1c MFr Bld: 5.9 % (ref 4.6–6.5)

## 2022-01-04 LAB — TSH: TSH: 4.45 u[IU]/mL (ref 0.35–5.50)

## 2022-01-04 NOTE — Progress Notes (Addendum)
Established Patient Office Visit   Subjective:  Patient ID: Megan Valentine, female    DOB: 21-Jun-1950  Age: 71 y.o. MRN: [id number]  Chief Complaint  Patient presents with   Follow-up    67mof/u. No concerns. Fasting.    HPI Encounter Diagnoses  Name Primary?   B12 deficiency Yes   Hypokalemia    Stage 3a chronic kidney disease (HCC)    Neuropathy    Hypothyroidism, unspecified type    Mixed hyperlipidemia    Elevated glucose    For follow-up of B12 deficiency.  She is on prolonged PPI use secondary to hiatal hernia.  History of hypokalemia.  She is on no medications that could lower the potassium.  Ongoing neuropathy that has responded well to gabapentin.  She had developed zoster in her left leg and that exacerbated.  She has tried to wean herself off of the Neurontin unsuccessfully secondary to the return of burning paresthesias.  History of hypothyroidism treated with naloxone.  History of mixed hyperlipidemia with low 10-year risk score.  Elevated glucose with normal hemoglobin A1c.  She is not exercising regularly.  She is up-to-date on her health maintenance.   Review of Systems  Constitutional: Negative.   HENT: Negative.    Eyes:  Negative for blurred vision, discharge and redness.  Respiratory: Negative.    Cardiovascular: Negative.   Gastrointestinal:  Negative for abdominal pain.  Genitourinary: Negative.   Musculoskeletal: Negative.  Negative for myalgias.  Skin:  Negative for rash.  Neurological:  Negative for tingling, loss of consciousness and weakness.  Endo/Heme/Allergies:  Negative for polydipsia.      01/04/2022    9:16 AM 09/06/2021    1:07 PM 07/06/2021   11:23 AM  Depression screen PHQ 2/9  Decreased Interest 0 0 0  Down, Depressed, Hopeless 0 1 0  PHQ - 2 Score 0 1 0       Current Outpatient Medications:    acetaminophen (TYLENOL) 325 MG tablet, Take 2 tablets (650 mg total) by mouth every 6 (six) hours as needed for moderate pain., Disp:  30 tablet, Rfl: 0   albuterol (VENTOLIN HFA) 108 (90 Base) MCG/ACT inhaler, Inhale into the lungs every 6 (six) hours as needed for wheezing or shortness of breath., Disp: , Rfl:    aspirin 81 MG chewable tablet, Chew 1 tablet (81 mg total) by mouth 2 (two) times daily., Disp: , Rfl:    Calcium Carbonate Antacid 1177 MG CHEW, Chew 1 tablet by mouth. As needed, Disp: , Rfl:    Cranberry 12600 MG CAPS, Take 1 tablet by mouth. daily, Disp: , Rfl:    diclofenac Sodium (VOLTAREN ARTHRITIS PAIN) 1 % GEL, Apply a small grape sized dollop 1 disorder 2 weeks of pends up to 4 times daily as needed, Disp: 150 g, Rfl: 1   docusate sodium (COLACE) 250 MG capsule, Take 250 mg by mouth daily. Takes 4 daily, Disp: , Rfl:    esomeprazole (NEXIUM) 40 MG capsule, Take 1 capsule (40 mg total) by mouth daily. Take medication 30 minutes before breakfast:, Disp: 90 capsule, Rfl: 1   estradiol (ESTRACE) 0.5 MG tablet, Taper as directed. No further refills., Disp: 30 tablet, Rfl: 0   famotidine (PEPCID) 20 mg, Inject 20 mg into the vein every 12 (twelve) hours., Disp: , Rfl:    fluticasone (FLOVENT HFA) 44 MCG/ACT inhaler, Inhale 2 puffs into the lungs 2 (two) times daily., Disp: 1 each, Rfl: 6   gabapentin (NEURONTIN) 300  MG capsule, TAKE 1 CAPSULE(300 MG) BY MOUTH TWICE DAILY FOR 7 DAYS THEN TAKE 1 CAPSULE(300 MG) BY MOUTH THREE TIMES DAILY FOR 23 DAYS, Disp: 90 capsule, Rfl: 3   hydroxypropyl methylcellulose / hypromellose (ISOPTO TEARS / GONIOVISC) 2.5 % ophthalmic solution, Place 1 drop into both eyes 4 (four) times daily as needed for dry eyes., Disp: , Rfl:    levocetirizine (XYZAL) 5 MG tablet, Take 1 tablet (5 mg total) by mouth every evening., Disp: 30 tablet, Rfl: 0   levothyroxine (SYNTHROID) 112 MCG tablet, Take 1 tablet (112 mcg total) by mouth daily before breakfast., Disp: 90 tablet, Rfl: 3   Multiple Vitamins-Minerals (HAIR/SKIN/NAILS/BIOTIN PO), Take 3 tablets by mouth in the morning., Disp: , Rfl:     potassium chloride SA (KLOR-CON M) 20 MEQ tablet, Take 1 tablet (20 mEq total) by mouth daily., Disp: 90 tablet, Rfl: 0   Sennosides 17.2 MG TABS, Take 1 tablet by mouth. Takes 6 times daily, Disp: , Rfl:    amoxicillin-clavulanate (AUGMENTIN) 875-125 MG tablet, Take 1 tablet by mouth 2 (two) times daily. (Patient not taking: Reported on 01/04/2022), Disp: 14 tablet, Rfl: 0   Objective:     BP 130/78 (BP Location: Right Arm, Patient Position: Sitting)   Pulse 72   Temp (!) 97.3 F (36.3 C) (Temporal)   Ht 5' 5.5" (1.664 m)   Wt 174 lb 6.4 oz (79.1 kg)   SpO2 98%   BMI 28.58 kg/m    Physical Exam Constitutional:      General: She is not in acute distress.    Appearance: Normal appearance. She is not ill-appearing, toxic-appearing or diaphoretic.  HENT:     Head: Normocephalic and atraumatic.     Right Ear: External ear normal.     Left Ear: External ear normal.     Mouth/Throat:     Mouth: Mucous membranes are moist.     Pharynx: Oropharynx is clear. No oropharyngeal exudate or posterior oropharyngeal erythema.  Eyes:     General: No scleral icterus.       Right eye: No discharge.        Left eye: No discharge.     Extraocular Movements: Extraocular movements intact.     Conjunctiva/sclera: Conjunctivae normal.     Pupils: Pupils are equal, round, and reactive to light.  Cardiovascular:     Rate and Rhythm: Normal rate and regular rhythm.  Pulmonary:     Effort: Pulmonary effort is normal. No respiratory distress.     Breath sounds: Normal breath sounds.  Abdominal:     General: Bowel sounds are normal.     Tenderness: There is no abdominal tenderness. There is no guarding.  Musculoskeletal:     Cervical back: No rigidity or tenderness.       Legs:  Skin:    General: Skin is warm and dry.  Neurological:     Mental Status: She is alert and oriented to person, place, and time.  Psychiatric:        Mood and Affect: Mood normal.        Behavior: Behavior normal.       No results found for any visits on 01/04/22.    The 10-year ASCVD risk score (Arnett DK, et al., 2019) is: 11.2%    Assessment & Plan:   B12 deficiency -     Vitamin B12  Hypokalemia -     Comprehensive metabolic panel  Stage 3a chronic kidney disease ([place]) -  Comprehensive metabolic panel -     Urinalysis, Routine w reflex microscopic  Neuropathy  Hypothyroidism, unspecified type -     CBC -     TSH  Mixed hyperlipidemia -     Comprehensive metabolic panel -     Lipid panel  Elevated glucose -     Hemoglobin A1c    Return in about 6 months (around 07/06/2022).   Will adjust medications pending results of today's labs.  Patient is statin intolerant. Libby Maw, MD

## 2022-01-05 ENCOUNTER — Other Ambulatory Visit: Payer: Self-pay

## 2022-01-05 DIAGNOSIS — E039 Hypothyroidism, unspecified: Secondary | ICD-10-CM

## 2022-01-05 MED ORDER — LEVOTHYROXINE SODIUM 112 MCG PO TABS: 112.0000 ug | ORAL_TABLET | Freq: Every day | ORAL | 3 refills | Status: DC

## 2022-01-10 ENCOUNTER — Ambulatory Visit
Admission: RE | Admit: 2022-01-10 | Discharge: 2022-01-10 | Disposition: A | Discharge status: Home / Self Care | Payer: PPO | Source: Ambulatory Visit | Attending: Family Medicine | Admitting: Family Medicine

## 2022-01-10 DIAGNOSIS — M5416 Radiculopathy, lumbar region: Secondary | ICD-10-CM

## 2022-01-10 DIAGNOSIS — M47817 Spondylosis without myelopathy or radiculopathy, lumbosacral region: Secondary | ICD-10-CM | POA: Diagnosis not present

## 2022-01-10 MED ORDER — IOPAMIDOL (ISOVUE-M 200) INJECTION 41%
1.0000 mL | Freq: Once | INTRAMUSCULAR | Status: AC
  Administered 2022-01-10: 1 mL via EPIDURAL

## 2022-01-10 MED ORDER — METHYLPREDNISOLONE ACETATE 40 MG/ML INJ SUSP (RADIOLOG
80.0000 mg | Freq: Once | INTRAMUSCULAR | Status: AC
  Administered 2022-01-10: 80 mg via EPIDURAL

## 2022-01-10 NOTE — Discharge Instructions (Signed)

## 2022-01-12 ENCOUNTER — Telehealth: Payer: Self-pay | Admitting: Family Medicine

## 2022-01-12 NOTE — Telephone Encounter (Signed)
Left message for patient to call back and schedule Medicare Annual Wellness Visit (AWV) in office.   If not able to come in office, please offer to do virtually or by telephone.  Left office number and my jabber [phone].  Last AWV:01/27/2021   Please schedule at anytime with Nurse Health Advisor.

## 2022-01-31 ENCOUNTER — Ambulatory Visit (INDEPENDENT_AMBULATORY_CARE_PROVIDER_SITE_OTHER): Payer: PPO

## 2022-01-31 VITALS — Ht 65.0 in | Wt 180.0 lb

## 2022-01-31 DIAGNOSIS — Z0000 Encounter for general adult medical examination without abnormal findings: Secondary | ICD-10-CM | POA: Diagnosis not present

## 2022-01-31 NOTE — Patient Instructions (Signed)
Megan Valentine , Thank you for taking time to come for your Medicare Wellness Visit. I appreciate your ongoing commitment to your health goals. Please review the following plan we discussed and let me know if I can assist you in the future.   These are the goals we discussed:  Goals      Chronic Care Management     CARE PLAN ENTRY  Current Barriers:  Chronic Disease Management support, education, and care coordination needs related to Hyperlipidemia, Chronic Kidney Disease, and Hypothyroidism   Hyperlipidemia Pharmacist Clinical Goal(s): Over the next 180 days, patient will work with PharmD and providers to achieve LDL goal < 100 Current regimen:  None Interventions: Recommend increasing physical activity slowly aiming for 150 minutes of moderate-intensity exercise weekly Recommend following the DASH or Mediterranean diet  Hypothyroidism Pharmacist Clinical Goal(s) Over the next 180 days, patient will work with PharmD and providers to maintain stable thyroid function Current regimen:  Levothyroxine 100 mcg Patient self care activities - Over the next 180 days, patient will: Continue to separate levothyroxine from food and other medications by at least 30 minutes  Separate levothyroxine from iron or calcium supplements by at least 4 hours   Medication management Pharmacist Clinical Goal(s): Over the next 180 days, patient will work with PharmD and providers to maintain optimal medication adherence Current pharmacy: [facility] Interventions Comprehensive medication review performed. Continue current medication management strategy Patient self care activities - Over the next 180 days, patient will: Take medications as prescribed Report any questions or concerns to PharmD and/or provider(s)     Patient Stated     Maintain cuirrent healthy eating plan     Patient Stated     01/31/2022, want sto lose weight        This is a list of the screening recommended for you and due dates:   Health Maintenance  Topic Date Due   Mammogram  09/29/2022   Medicare Annual Wellness Visit  02/01/2023   DTaP/Tdap/Td vaccine (3 - Td or Tdap) 03/25/2023   Colon Cancer Screening  11/30/2024   Pneumonia Vaccine  Completed   Flu Shot  Completed   DEXA scan (bone density measurement)  Completed   Hepatitis C Screening: USPSTF Recommendation to screen - Ages 39-79 yo.  Completed   Zoster (Shingles) Vaccine  Completed   HPV Vaccine  Aged Out   COVID-19 Vaccine  Discontinued    Advanced directives: copy in chart  Conditions/risks identified: none  Next appointment: Follow up in one year for your annual wellness visit    Preventive Care 65 Years and Older, Female Preventive care refers to lifestyle choices and visits with your health care provider that can promote health and wellness. What does preventive care include? A yearly physical exam. This is also called an annual well check. Dental exams once or twice a year. Routine eye exams. Ask your health care provider how often you should have your eyes checked. Personal lifestyle choices, including: Daily care of your teeth and gums. Regular physical activity. Eating a healthy diet. Avoiding tobacco and drug use. Limiting alcohol use. Practicing safe sex. Taking low-dose aspirin every day. Taking vitamin and mineral supplements as recommended by your health care provider. What happens during an annual well check? The services and screenings done by your health care provider during your annual well check will depend on your age, overall health, lifestyle risk factors, and family history of disease. Counseling  Your health care provider may ask you questions about your:  Alcohol use. Tobacco use. Drug use. Emotional well-being. Home and relationship well-being. Sexual activity. Eating habits. History of falls. Memory and ability to understand (cognition). Work and work Statistician. Reproductive health. Screening  You may  have the following tests or measurements: Height, weight, and BMI. Blood pressure. Lipid and cholesterol levels. These may be checked every 5 years, or more frequently if you are over 64 years old. Skin check. Lung cancer screening. You may have this screening every year starting at age 56 if you have a 30-pack-year history of smoking and currently smoke or have quit within the past 15 years. Fecal occult blood test (FOBT) of the stool. You may have this test every year starting at age 48. Flexible sigmoidoscopy or colonoscopy. You may have a sigmoidoscopy every 5 years or a colonoscopy every 10 years starting at age 55. Hepatitis C blood test. Hepatitis B blood test. Sexually transmitted disease (STD) testing. Diabetes screening. This is done by checking your blood sugar (glucose) after you have not eaten for a while (fasting). You may have this done every 1-3 years. Bone density scan. This is done to screen for osteoporosis. You may have this done starting at age 20. Mammogram. This may be done every 1-2 years. Talk to your health care provider about how often you should have regular mammograms. Talk with your health care provider about your test results, treatment options, and if necessary, the need for more tests. Vaccines  Your health care provider may recommend certain vaccines, such as: Influenza vaccine. This is recommended every year. Tetanus, diphtheria, and acellular pertussis (Tdap, Td) vaccine. You may need a Td booster every 10 years. Zoster vaccine. You may need this after age 22. Pneumococcal 13-valent conjugate (PCV13) vaccine. One dose is recommended after age 19. Pneumococcal polysaccharide (PPSV23) vaccine. One dose is recommended after age 95. Talk to your health care provider about which screenings and vaccines you need and how often you need them. This information is not intended to replace advice given to you by your health care provider. Make sure you discuss any  questions you have with your health care provider. Document Released: 02/06/2015 Document Revised: 09/30/2015 Document Reviewed: 11/11/2014 Elsevier Interactive Patient Education  2017 [place] Prevention in the Home Falls can cause injuries. They can happen to people of all ages. There are many things you can do to make your home safe and to help prevent falls. What can I do on the outside of my home? Regularly fix the edges of walkways and driveways and fix any cracks. Remove anything that might make you trip as you walk through a door, such as a raised step or threshold. Trim any bushes or trees on the path to your home. Use bright outdoor lighting. Clear any walking paths of anything that might make someone trip, such as rocks or tools. Regularly check to see if handrails are loose or broken. Make sure that both sides of any steps have handrails. Any raised decks and porches should have guardrails on the edges. Have any leaves, snow, or ice cleared regularly. Use sand or salt on walking paths during winter. Clean up any spills in your garage right away. This includes oil or grease spills. What can I do in the bathroom? Use night lights. Install grab bars by the toilet and in the tub and shower. Do not use towel bars as grab bars. Use non-skid mats or decals in the tub or shower. If you need to sit down in the  shower, use a plastic, non-slip stool. Keep the floor dry. Clean up any water that spills on the floor as soon as it happens. Remove soap buildup in the tub or shower regularly. Attach bath mats securely with double-sided non-slip rug tape. Do not have throw rugs and other things on the floor that can make you trip. What can I do in the bedroom? Use night lights. Make sure that you have a light by your bed that is easy to reach. Do not use any sheets or blankets that are too big for your bed. They should not hang down onto the floor. Have a firm chair that has side  arms. You can use this for support while you get dressed. Do not have throw rugs and other things on the floor that can make you trip. What can I do in the kitchen? Clean up any spills right away. Avoid walking on wet floors. Keep items that you use a lot in easy-to-reach places. If you need to reach something above you, use a strong step stool that has a grab bar. Keep electrical cords out of the way. Do not use floor polish or wax that makes floors slippery. If you must use wax, use non-skid floor wax. Do not have throw rugs and other things on the floor that can make you trip. What can I do with my stairs? Do not leave any items on the stairs. Make sure that there are handrails on both sides of the stairs and use them. Fix handrails that are broken or loose. Make sure that handrails are as long as the stairways. Check any carpeting to make sure that it is firmly attached to the stairs. Fix any carpet that is loose or worn. Avoid having throw rugs at the top or bottom of the stairs. If you do have throw rugs, attach them to the floor with carpet tape. Make sure that you have a light switch at the top of the stairs and the bottom of the stairs. If you do not have them, ask someone to add them for you. What else can I do to help prevent falls? Wear shoes that: Do not have high heels. Have rubber bottoms. Are comfortable and fit you well. Are closed at the toe. Do not wear sandals. If you use a stepladder: Make sure that it is fully opened. Do not climb a closed stepladder. Make sure that both sides of the stepladder are locked into place. Ask someone to hold it for you, if possible. Clearly mark and make sure that you can see: Any grab bars or handrails. First and last steps. Where the edge of each step is. Use tools that help you move around (mobility aids) if they are needed. These include: Canes. Walkers. Scooters. Crutches. Turn on the lights when you go into a dark area.  Replace any light bulbs as soon as they burn out. Set up your furniture so you have a clear path. Avoid moving your furniture around. If any of your floors are uneven, fix them. If there are any pets around you, be aware of where they are. Review your medicines with your doctor. Some medicines can make you feel dizzy. This can increase your chance of falling. Ask your doctor what other things that you can do to help prevent falls. This information is not intended to replace advice given to you by your health care provider. Make sure you discuss any questions you have with your health care provider. Document Released: 11/06/2008  Document Revised: 06/18/2015 Document Reviewed: 02/14/2014 Elsevier Interactive Patient Education  2017 [employer].

## 2022-01-31 NOTE — Progress Notes (Signed)
I connected with Megan Valentine today by telephone and verified that I am speaking with the correct person using two identifiers. Location patient: home Location provider: work Persons participating in the virtual visit: Megan Valentine, Megan Valentine.   I discussed the limitations, risks, security and privacy concerns of performing an evaluation and management service by telephone and the availability of in person appointments. I also discussed with the patient that there may be a patient responsible charge related to this service. The patient expressed understanding and verbally consented to this telephonic visit.    Interactive audio and video telecommunications were attempted between this provider and patient, however failed, due to patient having technical difficulties OR patient did not have access to video capability.  We continued and completed visit with audio only.     Vital signs may be patient reported or missing.  Subjective:   Megan Valentine is a 72 y.o. female who presents for Medicare Annual (Subsequent) preventive examination.  Review of Systems     Cardiac Risk Factors include: advanced age (>59mn, >>72women);dyslipidemia     Objective:    Today's Vitals   01/31/22 1427  Weight: 180 lb (81.6 kg)  Height: '5\' 5"'$  (1.651 m)   Body mass index is 29.95 kg/m.     01/31/2022    2:33 PM 01/27/2021    1:40 PM 12/01/2020    2:20 PM 11/17/2020    2:00 PM 11/04/2020   11:35 AM 10/15/2019    3:51 PM 07/11/2019    9:09 AM  Advanced Directives  Does Patient Have a Medical Advance Directive? Yes Yes Yes Yes Yes Yes Yes  Type of AParamed[place]of [place] will [place]Living will [place]Living will HSouth [place] IslandLiving will HSan MarcosLiving will HWade HamptonLiving will HBeersheba SpringsLiving will  Does patient want to make changes to medical  advance directive?   No - Patient declined No - Patient declined No - Patient declined    Copy of HCoplayin Chart? Yes - validated most recent copy scanned in chart (See row information) No - copy requested No - copy requested No - copy requested No - copy requested Yes - validated most recent copy scanned in chart (See row information)     Current Medications (verified) Outpatient Encounter Medications as of 01/31/2022  Medication Sig   acetaminophen (TYLENOL) 325 MG tablet Take 2 tablets (650 mg total) by mouth every 6 (six) hours as needed for moderate pain.   albuterol (VENTOLIN HFA) 108 (90 Base) MCG/ACT inhaler Inhale into the lungs every 6 (six) hours as needed for wheezing or shortness of breath.   aspirin 81 MG chewable tablet Chew 1 tablet (81 mg total) by mouth 2 (two) times daily.   Calcium Carbonate Antacid 1177 MG CHEW Chew 1 tablet by mouth. As needed   Cranberry 12600 MG CAPS Take 1 tablet by mouth. daily   diclofenac Sodium (VOLTAREN ARTHRITIS PAIN) 1 % GEL Apply a small grape sized dollop 1 disorder 2 weeks of pends up to 4 times daily as needed   docusate sodium (COLACE) 250 MG capsule Take 250 mg by mouth daily. Takes 4 daily   esomeprazole (NEXIUM) 40 MG capsule Take 1 capsule (40 mg total) by mouth daily. Take medication 30 minutes before breakfast:   estradiol (ESTRACE) 0.5 MG tablet Taper as directed. No further refills.   fluticasone (FLOVENT HFA) 44 MCG/ACT inhaler Inhale  2 puffs into the lungs 2 (two) times daily.   gabapentin (NEURONTIN) 300 MG capsule TAKE 1 CAPSULE(300 MG) BY MOUTH TWICE DAILY FOR 7 DAYS THEN TAKE 1 CAPSULE(300 MG) BY MOUTH THREE TIMES DAILY FOR 23 DAYS   hydroxypropyl methylcellulose / hypromellose (ISOPTO TEARS / GONIOVISC) 2.5 % ophthalmic solution Place 1 drop into both eyes 4 (four) times daily as needed for dry eyes.   levocetirizine (XYZAL) 5 MG tablet Take 1 tablet (5 mg total) by mouth every evening.   levothyroxine  (SYNTHROID) 112 MCG tablet Take 1 tablet (112 mcg total) by mouth daily before breakfast.   Multiple Vitamins-Minerals (HAIR/SKIN/NAILS/BIOTIN PO) Take 3 tablets by mouth in the morning.   Sennosides 17.2 MG TABS Take 1 tablet by mouth. Takes 6 times daily   amoxicillin-clavulanate (AUGMENTIN) 875-125 MG tablet Take 1 tablet by mouth 2 (two) times daily. (Patient not taking: Reported on 01/04/2022)   famotidine (PEPCID) 20 mg Inject 20 mg into the vein every 12 (twelve) hours.   potassium chloride SA (KLOR-CON M) 20 MEQ tablet Take 1 tablet (20 mEq total) by mouth daily.   No facility-administered encounter medications on file as of 01/31/2022.    Allergies (verified) Tetracycline, Dilaudid [hydromorphone hcl], and Other   History: Past Medical History:  Diagnosis Date   Allergy    Arthritis    Asthma    Cataract    Chronic bronchitis ([place])    Chronic kidney disease    stage 3, kidney infections   Complication of anesthesia    hard to wake up   Diverticulosis    Environmental allergies    GERD (gastroesophageal reflux disease)    Hiatal hernia    History of chicken pox    Hyperlipidemia    Hypothyroidism    IBS (irritable bowel syndrome)    Migraines    Mitral valve prolapse    Per pt . per Dr.Henry  [name].  does not have MVP   Neuromuscular disorder (Megan Valentine)    neuropathy feet   Pneumonia    Rectal prolapse    Past Surgical History:  Procedure Laterality Date   ABDOMINAL HYSTERECTOMY  1999   APPENDECTOMY  1962   BREAST CYST ASPIRATION Bilateral    CHOLECYSTECTOMY N/A 12/17/2018   Procedure: LAPAROSCOPIC CHOLECYSTECTOMY;  Surgeon: Megan Riley, MD;  Location: [facility];  Service: General;  Laterality: N/A;   COLONOSCOPY     EYE SURGERY     cataract   JOINT REPLACEMENT     rt knee scope   REFRACTIVE SURGERY  2000   both eyes   retrocele repair     and bladder sling   TOTAL KNEE ARTHROPLASTY Right 03/24/2015   Procedure: TOTAL KNEE ARTHROPLASTY;  Surgeon: Megan Balding, MD;  Location: [facility];  Service: Orthopedics;  Laterality: Right;   TOTAL KNEE ARTHROPLASTY Left 11/17/2020   Procedure: LEFT TOTAL KNEE ARTHROPLASTY;  Surgeon: Megan Balding, MD;  Location: [facility];  Service: Orthopedics;  Laterality: Left;   TUBAL LIGATION  1977   UPPER GASTROINTESTINAL ENDOSCOPY     Family History  Problem Relation Age of Onset   Stomach cancer Maternal Grandmother    Hypertension Maternal Grandmother    Diabetes Maternal Grandmother    Stroke Maternal Grandmother    Cancer Maternal Grandmother        Stomach cancer   Colon cancer Paternal Grandfather    Heart attack Paternal Grandfather    Hyperlipidemia Mother    Diabetes Mother  Hypertension Mother    Alzheimer's disease Mother    Diabetes Father    Hyperlipidemia Father    Heart disease Father    Heart attack Father    Stroke Paternal Grandmother    Social History   Socioeconomic History   Marital status: Widowed    Spouse name: Not on file   Number of children: Not on file   Years of education: 12   Highest education level: Not on file  Occupational History   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    Employer: [employer]  Tobacco Use   Smoking status: Former    Types: Cigarettes    Quit date: 03/23/1976    Years since quitting: 45.8   Smokeless tobacco: Never  Vaping Use   Vaping Use: Never used  Substance and Sexual Activity   Alcohol use: No   Drug use: No   Sexual activity: Yes    Birth control/protection: None, Post-menopausal  Other Topics Concern   Not on file  Social History Narrative   Caffeine Use-yes   Regular exercise-no   Right handed    Lives alone   Social Determinants of Health   Financial Resource Strain: Low Risk  (01/31/2022)   Overall Financial Resource Strain (CARDIA)    Difficulty of Paying Living Expenses: Not hard at all  Food Insecurity: No Food Insecurity (01/31/2022)   Hunger Vital Sign    Worried About Running Out of Food in  the Last Year: Never true    [place] in the Last Year: Never true  Transportation Needs: No Transportation Needs (01/31/2022)   PRAPARE - Hydrologist (Medical): No    Lack of Transportation (Non-Medical): No  Physical Activity: Insufficiently Active (01/31/2022)   Exercise Vital Sign    Days of Exercise per Week: 1 day    Minutes of Exercise per Session: 20 min  Stress: No Stress Concern Present (01/31/2022)   [place]    Feeling of Stress : Not at all  Social Connections: Moderately Isolated (01/27/2021)   Social Connection and Isolation Panel [NHANES]    Frequency of Communication with Friends and Family: Twice a week    Frequency of Social Gatherings with Friends and Family: Twice a week    Attends Religious Services: More than 4 times per year    Active Member of [employer] or Organizations: No    Attends Archivist Meetings: Never    Marital Status: Widowed    Tobacco Counseling Counseling given: Not Answered   Clinical Intake:  Pre-visit preparation completed: Yes  Pain : 0-10 Pain Type: Chronic pain Pain Location: Back Pain Orientation: Lower Pain Radiating Towards: goes to left hip Pain Descriptors / Indicators: Aching Pain Onset: More than a month ago Pain Frequency: Constant     Nutritional Status: BMI 25 -29 Overweight Nutritional Risks: None Diabetes: No  How often do you need to have someone help you when you read instructions, pamphlets, or other written materials from your doctor or pharmacy?: 1 - Never  Diabetic? no  Interpreter Needed?: No  Information entered by :: NAllen Valentine   Activities of Daily Living    01/31/2022    2:34 PM 01/28/2022   11:00 AM  In your present state of health, do you have any difficulty performing the following activities:  Hearing? 0 0  Vision? 0 0  Difficulty concentrating or making decisions? 0 0  Walking or  climbing stairs? 1 1  Dressing or bathing? 0 0  Doing errands, shopping? 0 0  Preparing Food and eating ? N N  Using the Toilet? N N  In the past six months, have you accidently leaked urine? N N  Do you have problems with loss of bowel control? Y Y  Managing your Medications? N N  Managing your Finances? N N  Housekeeping or managing your Housekeeping? N N    Patient Care Team: Libby Maw, MD as PCP - General (Family Medicine) Cameron Sprang, MD as Consulting Physician (Neurology)  Indicate any recent Medical Services you may have received from other than [facility] providers in the past year (date may be approximate).     Assessment:   This is a routine wellness examination for Megan Valentine.  Hearing/Vision screen Vision Screening - Comments:: Regular eye exams, [facility]  Dietary issues and exercise activities discussed: Current Exercise Habits: Home exercise routine, Type of exercise: walking, Time (Minutes): 20, Frequency (Times/Week): 1, Weekly Exercise (Minutes/Week): 20   Goals Addressed             This Visit's Progress    Patient Stated       01/31/2022, want sto lose weight       Depression Screen    01/31/2022    2:34 PM 01/04/2022    9:16 AM 09/06/2021    1:07 PM 07/06/2021   11:23 AM 05/13/2021    4:11 PM 01/27/2021    1:41 PM 01/27/2021    1:38 PM  PHQ 2/9 Scores  PHQ - 2 Score 0 0 1 0 0 0 0    Fall Risk    01/31/2022    2:34 PM 01/28/2022   11:00 AM 01/04/2022    9:16 AM 09/06/2021    1:07 PM 07/06/2021   11:23 AM  [place] in the past year? 0 0 0 0 0  Number falls in past yr: 0 0 0 0 0  Injury with Fall? 0  0    Risk for fall due to : Medication side effect      Follow up Falls prevention discussed;Education provided;Falls evaluation completed        FALL RISK PREVENTION PERTAINING TO THE HOME:  Any stairs in or around the home? No  If so, are there any without handrails?  ramp Home free of loose throw rugs in walkways, pet  beds, electrical cords, etc? Yes  Adequate lighting in your home to reduce risk of falls? Yes   ASSISTIVE DEVICES UTILIZED TO PREVENT FALLS:  Life alert? No  Use of a cane, walker or w/c? No  Grab bars in the bathroom? Yes  Shower chair or bench in shower?  Has when needed Elevated toilet seat or a handicapped toilet? Yes   TIMED UP AND GO:  Was the test performed? No .      Cognitive Function:        01/31/2022    2:35 PM 10/15/2019    4:02 PM  6CIT Screen  What Year? 0 points 0 points  What month? 0 points 0 points  What time? 0 points 0 points  Count back from 20 0 points 0 points  Months in reverse 2 points 0 points  Repeat phrase 4 points 0 points  Total Score 6 points 0 points    Immunizations Immunization History  Administered Date(s) Administered   COVID-19, mRNA, vaccine(Comirnaty)12 years and older 12/08/2021   Fluad  Quad(high Dose 65+) 10/05/2018, 10/10/2019   Influenza Whole 10/09/2015, 11/09/2021   Influenza, High Dose Seasonal PF 10/19/2016, 10/19/2017, 09/30/2020   Influenza,inj,Quad PF,6+ Mos 11/07/2013, 11/04/2014   Influenza-Unspecified 10/25/2010   Moderna Covid-19 Vaccine Bivalent Booster 59yr & up 12/08/2021   Moderna Sars-Covid-2 Vaccination 03/07/2019, 04/05/2019, 12/07/2019, 08/21/2020   Pneumococcal Conjugate-13 06/08/2014   Pneumococcal Polysaccharide-23 07/09/2015   Respiratory Syncytial Virus Vaccine,Recomb Aduvanted(Arexvy) 12/14/2021   Tdap 11/25/2002, 03/24/2013   Zoster Recombinat (Shingrix) 10/24/2016, 12/30/2016   Zoster, Live 08/08/2015    TDAP status: Up to date  Flu Vaccine status: Up to date  Pneumococcal vaccine status: Up to date  Covid-19 vaccine status: Completed vaccines  Qualifies for Shingles Vaccine? Yes   Zostavax completed Yes   Shingrix Completed?: Yes  Screening Tests Health Maintenance  Topic Date Due   Medicare Annual Wellness (AWV)  01/27/2022   MAMMOGRAM  09/29/2022   DTaP/Tdap/Td (3 - Td or  Tdap) 03/25/2023   COLONOSCOPY (Pts 45-441yrInsurance coverage will need to be confirmed)  11/30/2024   Pneumonia Vaccine 72Years old  Completed   INFLUENZA VACCINE  Completed   DEXA SCAN  Completed   Hepatitis C Screening  Completed   Zoster Vaccines- Shingrix  Completed   HPV VACCINES  Aged Out   COVID-19 Vaccine  Discontinued    Health Maintenance  Health Maintenance Due  Topic Date Due   Medicare Annual Wellness (AWV)  01/27/2022    Colorectal cancer screening: Type of screening: Colonoscopy. Completed 11/30/2021. Repeat every 3 years  Mammogram status: Completed 09/28/2021. Repeat every year  Bone Density status: Completed 02/05/2019.   Lung Cancer Screening: (Low Dose CT Chest recommended if Age 72-80ears, 30 pack-year currently smoking OR have quit w/in 15years.) does not qualify.   Lung Cancer Screening Referral: no  Additional Screening:  Hepatitis C Screening: does qualify; Completed 09/11/2015  Vision Screening: Recommended annual ophthalmology exams for early detection of glaucoma and other disorders of the eye. Is the patient up to date with their annual eye exam?  Yes  Who is the provider or what is the name of the office in which the patient attends annual eye exams? [facility] is not [facility] with a provider, would they like to be referred to a provider to establish care? No .   Dental Screening: Recommended annual dental exams for proper oral hygiene  Community Resource Referral / Chronic Care Management: CRR required this visit?  No   CCM required this visit?  No      Plan:     I have personally reviewed and noted the following in the patient's chart:   Medical and social history Use of alcohol, tobacco or illicit drugs  Current medications and supplements including opioid prescriptions. Patient is not currently taking opioid prescriptions. Functional ability and status Nutritional status Physical activity Advanced  directives List of other physicians Hospitalizations, surgeries, and ER visits in previous 12 months Vitals Screenings to include cognitive, depression, and falls Referrals and appointments  In addition, I have reviewed and discussed with patient certain preventive protocols, quality metrics, and best practice recommendations. A written personalized care plan for preventive services as well as general preventive health recommendations were provided to patient.     NiKellie SimmeringLPN   1/03/26/4399 Nurse Notes: none  Due to this being a virtual visit, the after visit summary with patients personalized plan was offered to patient via mail or my-chart.  Patient would like to access  on my-chart

## 2022-02-10 ENCOUNTER — Telehealth: Payer: PPO | Admitting: Family Medicine

## 2022-02-10 DIAGNOSIS — B9689 Other specified bacterial agents as the cause of diseases classified elsewhere: Secondary | ICD-10-CM | POA: Diagnosis not present

## 2022-02-10 DIAGNOSIS — J019 Acute sinusitis, unspecified: Secondary | ICD-10-CM | POA: Diagnosis not present

## 2022-02-10 MED ORDER — DOXYCYCLINE HYCLATE 100 MG PO TABS: 100.0000 mg | ORAL_TABLET | Freq: Two times a day (BID) | ORAL | 0 refills | Status: DC

## 2022-02-10 NOTE — Progress Notes (Signed)
Virtual Visit Consent   Megan Valentine, you are scheduled for a virtual visit with a [facility] provider today. Just as with appointments in the office, your consent must be obtained to participate. Your consent will be active for this visit and any virtual visit you may have with one of our providers in the next 365 days. If you have a MyChart account, a copy of this consent can be sent to you electronically.  As this is a virtual visit, video technology does not allow for your provider to perform a traditional examination. This may limit your provider's ability to fully assess your condition. If your provider identifies any concerns that need to be evaluated in person or the need to arrange testing (such as labs, EKG, etc.), we will make arrangements to do so. Although advances in technology are sophisticated, we cannot ensure that it will always work on either your end or our end. If the connection with a video visit is poor, the visit may have to be switched to a telephone visit. With either a video or telephone visit, we are not always able to ensure that we have a secure connection.  By engaging in this virtual visit, you consent to the provision of healthcare and authorize for your insurance to be billed (if applicable) for the services provided during this visit. Depending on your insurance coverage, you may receive a charge related to this service.  I need to obtain your verbal consent now. Are you willing to proceed with your visit today? Megan Valentine has provided verbal consent on 02/10/2022 for a virtual visit (video or telephone). [name], NP  Date: 02/10/2022 12:01 PM  Virtual Visit via Video Note   I, [name], connected with  Megan Valentine  ([id number], 23-Nov-1950) on 02/10/22 at 12:00 PM EST by a video-enabled telemedicine application and verified that I am speaking with the correct person using two identifiers.  Location: Patient: Virtual Visit Location Patient:  Home Provider: Virtual Visit Location Provider: Home Office   I discussed the limitations of evaluation and management by telemedicine and the availability of in person appointments. The patient expressed understanding and agreed to proceed.    History of Present Illness: Megan Valentine is a 72 y.o. who identifies as a female who was assigned female at birth, and is being seen today for sinus congestion.  HPI: Sinus Problem This is a new problem. The current episode started in the past 7 days. The problem has been gradually worsening since onset. There has been no fever. Her pain is at a severity of 8/10. The pain is moderate. Associated symptoms include congestion, coughing, ear pain, headaches, shortness of breath, sinus pressure and a sore throat. Pertinent negatives include no chills, diaphoresis, hoarse voice, neck pain, sneezing or swollen glands. Past treatments include oral decongestants. The treatment provided mild relief.    Problems:  Patient Active Problem List   Diagnosis Date Noted   Myalgia due to statin 01/04/2022   Arthritis of carpometacarpal [facility]) joint of both thumbs 09/07/2021   Carpal tunnel syndrome of right wrist 09/06/2021   Primary osteoarthritis of right hand 09/06/2021   Neuropathy 05/13/2021   Venous insufficiency 05/13/2021   Low back pain 04/05/2021   Acquired leg length discrepancy 04/05/2021   Urinary urgency 01/14/2021   Acute cystitis without hematuria 01/14/2021   Macrocytosis 12/07/2020   S/P TKR (total knee replacement) using cement, left 11/17/2020   Chronic pain of right knee 09/30/2020  Medication side effect 09/30/2020   Statin intolerance 06/30/2020   Trigger point of shoulder region, left 02/25/2020   B12 deficiency 08/15/2019   Elevated glucose 08/15/2019   Anemia 02/15/2019   Abdominal pain 12/14/2018   Sinus pressure 11/12/2018   History of 2019 novel coronavirus disease (COVID-19) 11/12/2018   Pleurisy 06/05/2018   Enterocele  06/05/2018   Rectocele 05/23/2018   Vaginal vault prolapse 05/23/2018   SUI (stress urinary incontinence, female) 05/23/2018   Asthma 05/07/2018   Stage 3a chronic kidney disease ([place]) 01/15/2018   Edema 12/15/2017   Insomnia 11/20/2017   Seborrheic dermatitis 11/16/2017   Seasonal allergic rhinitis due to pollen 11/16/2017   Menopausal and female climacteric states 10/19/2017   Constipation 03/26/2015   Unilateral primary osteoarthritis, left knee 11/17/2014   Healthcare maintenance 04/25/2014   Hypokalemia 09/15/2011   Hypothyroidism 08/21/2008   Mixed hyperlipidemia 08/21/2008    Allergies:  Allergies  Allergen Reactions   Tetracycline Hives and Itching   Dilaudid [Hydromorphone Hcl] Nausea And Vomiting   Other     Cigarette smoke and perfumes--flares asthma (develops bronchitis)   Medications:  Current Outpatient Medications:    acetaminophen (TYLENOL) 325 MG tablet, Take 2 tablets (650 mg total) by mouth every 6 (six) hours as needed for moderate pain., Disp: 30 tablet, Rfl: 0   albuterol (VENTOLIN HFA) 108 (90 Base) MCG/ACT inhaler, Inhale into the lungs every 6 (six) hours as needed for wheezing or shortness of breath., Disp: , Rfl:    amoxicillin-clavulanate (AUGMENTIN) 875-125 MG tablet, Take 1 tablet by mouth 2 (two) times daily. (Patient not taking: Reported on 01/04/2022), Disp: 14 tablet, Rfl: 0   aspirin 81 MG chewable tablet, Chew 1 tablet (81 mg total) by mouth 2 (two) times daily., Disp: , Rfl:    Calcium Carbonate Antacid 1177 MG CHEW, Chew 1 tablet by mouth. As needed, Disp: , Rfl:    Cranberry 12600 MG CAPS, Take 1 tablet by mouth. daily, Disp: , Rfl:    diclofenac Sodium (VOLTAREN ARTHRITIS PAIN) 1 % GEL, Apply a small grape sized dollop 1 disorder 2 weeks of pends up to 4 times daily as needed, Disp: 150 g, Rfl: 1   docusate sodium (COLACE) 250 MG capsule, Take 250 mg by mouth daily. Takes 4 daily, Disp: , Rfl:    esomeprazole (NEXIUM) 40 MG capsule, Take 1  capsule (40 mg total) by mouth daily. Take medication 30 minutes before breakfast:, Disp: 90 capsule, Rfl: 1   estradiol (ESTRACE) 0.5 MG tablet, Taper as directed. No further refills., Disp: 30 tablet, Rfl: 0   famotidine (PEPCID) 20 mg, Inject 20 mg into the vein every 12 (twelve) hours., Disp: , Rfl:    fluticasone (FLOVENT HFA) 44 MCG/ACT inhaler, Inhale 2 puffs into the lungs 2 (two) times daily., Disp: 1 each, Rfl: 6   gabapentin (NEURONTIN) 300 MG capsule, TAKE 1 CAPSULE(300 MG) BY MOUTH TWICE DAILY FOR 7 DAYS THEN TAKE 1 CAPSULE(300 MG) BY MOUTH THREE TIMES DAILY FOR 23 DAYS, Disp: 90 capsule, Rfl: 3   hydroxypropyl methylcellulose / hypromellose (ISOPTO TEARS / GONIOVISC) 2.5 % ophthalmic solution, Place 1 drop into both eyes 4 (four) times daily as needed for dry eyes., Disp: , Rfl:    levocetirizine (XYZAL) 5 MG tablet, Take 1 tablet (5 mg total) by mouth every evening., Disp: 30 tablet, Rfl: 0   levothyroxine (SYNTHROID) 112 MCG tablet, Take 1 tablet (112 mcg total) by mouth daily before breakfast., Disp: 90 tablet, Rfl: 3  Multiple Vitamins-Minerals (HAIR/SKIN/NAILS/BIOTIN PO), Take 3 tablets by mouth in the morning., Disp: , Rfl:    potassium chloride SA (KLOR-CON M) 20 MEQ tablet, Take 1 tablet (20 mEq total) by mouth daily., Disp: 90 tablet, Rfl: 0   Sennosides 17.2 MG TABS, Take 1 tablet by mouth. Takes 6 times daily, Disp: , Rfl:   Observations/Objective: Patient is well-developed, well-nourished in no acute distress.  Resting comfortably  at home.  Head is normocephalic, atraumatic.  No labored breathing.  Speech is clear and coherent with logical content.  Patient is alert and oriented at baseline.    Assessment and Plan: 1. Acute bacterial sinusitis  - doxycycline (VIBRA-TABS) 100 MG tablet; Take 1 tablet (100 mg total) by mouth 2 (two) times daily for 10 days.  Dispense: 20 tablet; Refill: 0  -Take meds as prescribed- recent Augmentin in last 2 months switching  class -Rest -Use a cool mist humidifier especially during the winter months when heat dries out the air. - Use saline nose sprays frequently to help soothe nasal passages and promote drainage. -Saline irrigations of the nose can be very helpful if done frequently.             * 4X daily for 1 week*             * Use of a nettie pot can be helpful with this.  *Follow directions with this* *Boiled or distilled water only -stay hydrated by drinking plenty of fluids - Keep thermostat turn down low to prevent drying out sinuses - For any cough or congestion- robitussin DM or Delsym as needed - For fever or aches or pains- take tylenol or ibuprofen as directed on bottle             * for fevers greater than 101 orally you may alternate ibuprofen and tylenol every 3 hours.  If you do not improve you will need a follow up visit in person.                Reviewed side effects, risks and benefits of medication.    Patient acknowledged agreement and understanding of the plan.   Past Medical, Surgical, Social History, Allergies, and Medications have been Reviewed.    Follow Up Instructions: I discussed the assessment and treatment plan with the patient. The patient was provided an opportunity to ask questions and all were answered. The patient agreed with the plan and demonstrated an understanding of the instructions.  A copy of instructions were sent to the patient via MyChart unless otherwise noted below.    The patient was advised to call back or seek an in-person evaluation if the symptoms worsen or if the condition fails to improve as anticipated.  Time:  I spent 10 minutes with the patient via telehealth technology discussing the above problems/concerns.    [name], NP

## 2022-02-10 NOTE — Patient Instructions (Addendum)
Megan Valentine, thank you for joining [name], NP for today's virtual visit.  While this provider is not your primary care provider (PCP), if your PCP is located in our provider database this encounter information will be shared with them immediately following your visit.   [facility] account gives you access to today's visit and all your visits, tests, and labs performed at [facility] " click here if you don't have a [facility] account or go to [email or web address]  Consent: (Patient) Megan Valentine provided verbal consent for this virtual visit at the beginning of the encounter.  Current Medications:  Current Outpatient Medications:    acetaminophen (TYLENOL) 325 MG tablet, Take 2 tablets (650 mg total) by mouth every 6 (six) hours as needed for moderate pain., Disp: 30 tablet, Rfl: 0   albuterol (VENTOLIN HFA) 108 (90 Base) MCG/ACT inhaler, Inhale into the lungs every 6 (six) hours as needed for wheezing or shortness of breath., Disp: , Rfl:    aspirin 81 MG chewable tablet, Chew 1 tablet (81 mg total) by mouth 2 (two) times daily., Disp: , Rfl:    Calcium Carbonate Antacid 1177 MG CHEW, Chew 1 tablet by mouth. As needed, Disp: , Rfl:    Cranberry 12600 MG CAPS, Take 1 tablet by mouth. daily, Disp: , Rfl:    diclofenac Sodium (VOLTAREN ARTHRITIS PAIN) 1 % GEL, Apply a small grape sized dollop 1 disorder 2 weeks of pends up to 4 times daily as needed, Disp: 150 g, Rfl: 1   docusate sodium (COLACE) 250 MG capsule, Take 250 mg by mouth daily. Takes 4 daily, Disp: , Rfl:    doxycycline (VIBRA-TABS) 100 MG tablet, Take 1 tablet (100 mg total) by mouth 2 (two) times daily for 10 days., Disp: 20 tablet, Rfl: 0   esomeprazole (NEXIUM) 40 MG capsule, Take 1 capsule (40 mg total) by mouth daily. Take medication 30 minutes before breakfast:, Disp: 90 capsule, Rfl: 1   estradiol (ESTRACE) 0.5 MG tablet, Taper as directed. No further refills., Disp: 30 tablet,  Rfl: 0   famotidine (PEPCID) 20 mg, Inject 20 mg into the vein every 12 (twelve) hours., Disp: , Rfl:    fluticasone (FLOVENT HFA) 44 MCG/ACT inhaler, Inhale 2 puffs into the lungs 2 (two) times daily., Disp: 1 each, Rfl: 6   gabapentin (NEURONTIN) 300 MG capsule, TAKE 1 CAPSULE(300 MG) BY MOUTH TWICE DAILY FOR 7 DAYS THEN TAKE 1 CAPSULE(300 MG) BY MOUTH THREE TIMES DAILY FOR 23 DAYS, Disp: 90 capsule, Rfl: 3   hydroxypropyl methylcellulose / hypromellose (ISOPTO TEARS / GONIOVISC) 2.5 % ophthalmic solution, Place 1 drop into both eyes 4 (four) times daily as needed for dry eyes., Disp: , Rfl:    levocetirizine (XYZAL) 5 MG tablet, Take 1 tablet (5 mg total) by mouth every evening., Disp: 30 tablet, Rfl: 0   levothyroxine (SYNTHROID) 112 MCG tablet, Take 1 tablet (112 mcg total) by mouth daily before breakfast., Disp: 90 tablet, Rfl: 3   Multiple Vitamins-Minerals (HAIR/SKIN/NAILS/BIOTIN PO), Take 3 tablets by mouth in the morning., Disp: , Rfl:    potassium chloride SA (KLOR-CON M) 20 MEQ tablet, Take 1 tablet (20 mEq total) by mouth daily., Disp: 90 tablet, Rfl: 0   Sennosides 17.2 MG TABS, Take 1 tablet by mouth. Takes 6 times daily, Disp: , Rfl:    Medications ordered in this encounter:  Meds ordered this encounter  Medications   doxycycline (VIBRA-TABS) 100 MG tablet  Sig: Take 1 tablet (100 mg total) by mouth 2 (two) times daily for 10 days.    Dispense:  20 tablet    Refill:  0    Order Specific Question:   Supervising Provider    Answer:   Chase Picket [id number]     *If you need refills on other medications prior to your next appointment, please contact your pharmacy*  Follow-Up: Call back or seek an in-person evaluation if the symptoms worsen or if the condition fails to improve as anticipated.  [facility] [phone]  Other Instructions  -Take meds as prescribed -Rest -Use a cool mist humidifier especially during the winter months when heat dries out  the air. - Use saline nose sprays frequently to help soothe nasal passages and promote drainage. -Saline irrigations of the nose can be very helpful if done frequently.             * 4X daily for 1 week*             * Use of a nettie pot can be helpful with this.  *Follow directions with this* *Boiled or distilled water only -stay hydrated by drinking plenty of fluids - Keep thermostat turn down low to prevent drying out sinuses - For any cough or congestion- robitussin DM or Delsym as needed - For fever or aches or pains- take tylenol or ibuprofen as directed on bottle             * for fevers greater than 101 orally you may alternate ibuprofen and tylenol every 3 hours.  If you do not improve you will need a follow up visit in person.                  If you have been instructed to have an in-person evaluation today at a local Urgent Care facility, please use the link below. It will take you to a list of all of our available [facility], including address, phone number and hours of operation. Please do not delay care.  [facility]  If you or a family member do not have a primary care provider, use the link below to schedule a visit and establish care. When you choose a [facility] primary care physician or advanced practice provider, you gain a long-term partner in health. Find a Primary Care Provider  Learn more about [facility]'s in-office and virtual care options: [facility]

## 2022-02-17 ENCOUNTER — Telehealth: Payer: PPO | Admitting: Urgent Care

## 2022-02-17 DIAGNOSIS — J0101 Acute recurrent maxillary sinusitis: Secondary | ICD-10-CM

## 2022-02-17 DIAGNOSIS — H9201 Otalgia, right ear: Secondary | ICD-10-CM

## 2022-02-17 DIAGNOSIS — H6991 Unspecified Eustachian tube disorder, right ear: Secondary | ICD-10-CM

## 2022-02-17 MED ORDER — AMOXICILLIN-POT CLAVULANATE 875-125 MG PO TABS: 1.0000 | ORAL_TABLET | Freq: Two times a day (BID) | ORAL | 0 refills | Status: AC

## 2022-02-17 MED ORDER — PREDNISONE 10 MG (21) PO TBPK: ORAL_TABLET | Freq: Every day | ORAL | 0 refills | Status: DC

## 2022-02-17 MED ORDER — FLUTICASONE PROPIONATE 50 MCG/ACT NA SUSP: 1.0000 | Freq: Two times a day (BID) | NASAL | 0 refills | Status: DC

## 2022-02-17 NOTE — Progress Notes (Signed)
Virtual Visit Consent   Megan Valentine, you are scheduled for a virtual visit with a [facility] provider today. Just as with appointments in the office, your consent must be obtained to participate. Your consent will be active for this visit and any virtual visit you may have with one of our providers in the next 365 days. If you have a MyChart account, a copy of this consent can be sent to you electronically.  As this is a virtual visit, video technology does not allow for your provider to perform a traditional examination. This may limit your provider's ability to fully assess your condition. If your provider identifies any concerns that need to be evaluated in person or the need to arrange testing (such as labs, EKG, etc.), we will make arrangements to do so. Although advances in technology are sophisticated, we cannot ensure that it will always work on either your end or our end. If the connection with a video visit is poor, the visit may have to be switched to a telephone visit. With either a video or telephone visit, we are not always able to ensure that we have a secure connection.  By engaging in this virtual visit, you consent to the provision of healthcare and authorize for your insurance to be billed (if applicable) for the services provided during this visit. Depending on your insurance coverage, you may receive a charge related to this service.  I need to obtain your verbal consent now. Are you willing to proceed with your visit today? Megan Valentine has provided verbal consent on 02/17/2022 for a virtual visit (video or telephone). Chaney Malling, PA  Date: 02/17/2022 10:01 AM  Virtual Visit via Video Note   I, [place], connected with  Megan Valentine  ([id number], [birth year]) on 02/17/22 at  8:45 AM EST by a video-enabled telemedicine application and verified that I am speaking with the correct person using two identifiers.  Location: Patient: Virtual Visit Location Patient:  Home Provider: Virtual Visit Location Provider: Home Office   I discussed the limitations of evaluation and management by telemedicine and the availability of in person appointments. The patient expressed understanding and agreed to proceed.    History of Present Illness: Megan Valentine is a 72 y.o. who identifies as a female who was assigned female at birth, and is being seen today for sinus pain.  HPI: 72yo female with known hx of recurrent sinusitis presents today due to continued sinus pain, pressure and new onset of ear and tooth pain. She was seen on 02/10/22 after having had one full week of sinus symptoms. She was prescribed doxycycline BID x 10 days. Pt states she has been taking it as prescribed. She has never taken this in the past and feels that it has been ineffective. She reports no improvement to her symptoms. Additionally, she states she is now having new symptoms. The pain in her ear is primarily on the R with extension into her neck. She reports pain to touch her maxillary region, post nasal drainage and now a cough. She denies fevers. Had Augmentin in Nov, states this is what she typically gets and feels this is most effective. No additional sx at present time.   Problems:  Patient Active Problem List   Diagnosis Date Noted   Myalgia due to statin 01/04/2022   Arthritis of carpometacarpal [facility]) joint of both thumbs 09/07/2021   Carpal tunnel syndrome of right wrist 09/06/2021   Primary osteoarthritis of right  hand 09/06/2021   Neuropathy 05/13/2021   Venous insufficiency 05/13/2021   Low back pain 04/05/2021   Acquired leg length discrepancy 04/05/2021   Urinary urgency 01/14/2021   Acute cystitis without hematuria 01/14/2021   Macrocytosis 12/07/2020   S/P TKR (total knee replacement) using cement, left 11/17/2020   Chronic pain of right knee 09/30/2020   Medication side effect 09/30/2020   Statin intolerance 06/30/2020   Trigger point of shoulder region, left  02/25/2020   B12 deficiency 08/15/2019   Elevated glucose 08/15/2019   Anemia 02/15/2019   Abdominal pain 12/14/2018   Sinus pressure 11/12/2018   History of 2019 novel coronavirus disease (COVID-19) 11/12/2018   Pleurisy 06/05/2018   Enterocele 06/05/2018   Rectocele 05/23/2018   Vaginal vault prolapse 05/23/2018   SUI (stress urinary incontinence, female) 05/23/2018   Asthma 05/07/2018   Stage 3a chronic kidney disease ([place]) 01/15/2018   Edema 12/15/2017   Insomnia 11/20/2017   Seborrheic dermatitis 11/16/2017   Seasonal allergic rhinitis due to pollen 11/16/2017   Menopausal and female climacteric states 10/19/2017   Constipation 03/26/2015   Unilateral primary osteoarthritis, left knee 11/17/2014   Healthcare maintenance 04/25/2014   Hypokalemia 09/15/2011   Hypothyroidism 08/21/2008   Mixed hyperlipidemia 08/21/2008    Allergies:  Allergies  Allergen Reactions   Tetracycline Hives and Itching   Dilaudid [Hydromorphone Hcl] Nausea And Vomiting   Other     Cigarette smoke and perfumes--flares asthma (develops bronchitis)   Medications:  Current Outpatient Medications:    amoxicillin-clavulanate (AUGMENTIN) 875-125 MG tablet, Take 1 tablet by mouth 2 (two) times daily with a meal for 14 days., Disp: 28 tablet, Rfl: 0   fluticasone (FLONASE) 50 MCG/ACT nasal spray, Place 1 spray into both nostrils in the morning and at bedtime., Disp: 16 g, Rfl: 0   predniSONE (STERAPRED UNI-PAK 21 TAB) 10 MG (21) TBPK tablet, Take by mouth daily. Take 6 tabs by mouth daily  for 1 days, then 5 tabs for 1 days, then 4 tabs for 1 days, then 3 tabs for 1 days, 2 tabs for 1 days, then 1 tab by mouth daily for 1 days, Disp: 21 tablet, Rfl: 0   acetaminophen (TYLENOL) 325 MG tablet, Take 2 tablets (650 mg total) by mouth every 6 (six) hours as needed for moderate pain., Disp: 30 tablet, Rfl: 0   albuterol (VENTOLIN HFA) 108 (90 Base) MCG/ACT inhaler, Inhale into the lungs every 6 (six) hours as  needed for wheezing or shortness of breath., Disp: , Rfl:    aspirin 81 MG chewable tablet, Chew 1 tablet (81 mg total) by mouth 2 (two) times daily., Disp: , Rfl:    Calcium Carbonate Antacid 1177 MG CHEW, Chew 1 tablet by mouth. As needed, Disp: , Rfl:    Cranberry 12600 MG CAPS, Take 1 tablet by mouth. daily, Disp: , Rfl:    diclofenac Sodium (VOLTAREN ARTHRITIS PAIN) 1 % GEL, Apply a small grape sized dollop 1 disorder 2 weeks of pends up to 4 times daily as needed, Disp: 150 g, Rfl: 1   docusate sodium (COLACE) 250 MG capsule, Take 250 mg by mouth daily. Takes 4 daily, Disp: , Rfl:    esomeprazole (NEXIUM) 40 MG capsule, Take 1 capsule (40 mg total) by mouth daily. Take medication 30 minutes before breakfast:, Disp: 90 capsule, Rfl: 1   estradiol (ESTRACE) 0.5 MG tablet, Taper as directed. No further refills., Disp: 30 tablet, Rfl: 0   famotidine (PEPCID) 20 mg, Inject 20 mg  into the vein every 12 (twelve) hours., Disp: , Rfl:    fluticasone (FLOVENT HFA) 44 MCG/ACT inhaler, Inhale 2 puffs into the lungs 2 (two) times daily., Disp: 1 each, Rfl: 6   gabapentin (NEURONTIN) 300 MG capsule, TAKE 1 CAPSULE(300 MG) BY MOUTH TWICE DAILY FOR 7 DAYS THEN TAKE 1 CAPSULE(300 MG) BY MOUTH THREE TIMES DAILY FOR 23 DAYS, Disp: 90 capsule, Rfl: 3   hydroxypropyl methylcellulose / hypromellose (ISOPTO TEARS / GONIOVISC) 2.5 % ophthalmic solution, Place 1 drop into both eyes 4 (four) times daily as needed for dry eyes., Disp: , Rfl:    levocetirizine (XYZAL) 5 MG tablet, Take 1 tablet (5 mg total) by mouth every evening., Disp: 30 tablet, Rfl: 0   levothyroxine (SYNTHROID) 112 MCG tablet, Take 1 tablet (112 mcg total) by mouth daily before breakfast., Disp: 90 tablet, Rfl: 3   Multiple Vitamins-Minerals (HAIR/SKIN/NAILS/BIOTIN PO), Take 3 tablets by mouth in the morning., Disp: , Rfl:    potassium chloride SA (KLOR-CON M) 20 MEQ tablet, Take 1 tablet (20 mEq total) by mouth daily., Disp: 90 tablet, Rfl: 0    Sennosides 17.2 MG TABS, Take 1 tablet by mouth. Takes 6 times daily, Disp: , Rfl:   Observations/Objective: Patient is well-developed, well-nourished in no acute distress.  Resting comfortably in chair at home.  Head is normocephalic, atraumatic.  No labored breathing.  Speech is clear and coherent with logical content.  Patient is alert and oriented at baseline.  Tenderness to personal palpation of R maxillary sinus  Assessment and Plan: 1. Acute recurrent maxillary sinusitis  2. Right ear pain  3. Eustachian tube dysfunction, right  Pt with recurrent sinusitis. Usually does well on Augmentin, has failed to respond to doxycycline. Will stop doxy and switch to Augmentin. Will also add flonase and PO prednisone given her ear and ET symptoms.  Follow Up Instructions: I discussed the assessment and treatment plan with the patient. The patient was provided an opportunity to ask questions and all were answered. The patient agreed with the plan and demonstrated an understanding of the instructions.  A copy of instructions were sent to the patient via MyChart unless otherwise noted below.    The patient was advised to call back or seek an in-person evaluation if the symptoms worsen or if the condition fails to improve as anticipated.  Time:  I spent 9 minutes with the patient via telehealth technology discussing the above problems/concerns.    [place], PA

## 2022-02-17 NOTE — Progress Notes (Signed)
[facility] [place] Phone: [phone] Subjective:   Megan Valentine, am serving as a scribe for Dr. Hulan Saas.  I'm seeing this patient by the request  of:  Libby Maw, MD  CC low back pain  AST:[id number]  10/19/2021 Patient given injection and tolerated the procedure well, discussed icing regimen and home exercises, which activities to do which ones to avoid, discussed the Exos brace with patient having severe arthritic changes noted.  Does have some weakness noted as wel     Updated 02/21/2022 Megan Valentine is a 72 y.o. female coming in with complaint of back pain. Epi was on 01/10/2022. Epidural did not help. Has URI and taking prednisone which has completely eliminated her pain. Tried to increase gabapentin dose but this did not help.       Past Medical History:  Diagnosis Date   Allergy    Arthritis    Asthma    Cataract    Chronic bronchitis ([place])    Chronic kidney disease    stage 3, kidney infections   Complication of anesthesia    hard to wake up   Diverticulosis    Environmental allergies    GERD (gastroesophageal reflux disease)    Hiatal hernia    History of chicken pox    Hyperlipidemia    Hypothyroidism    IBS (irritable bowel syndrome)    Migraines    Mitral valve prolapse    Per pt . per Dr.Henry  [name].  does not have MVP   Neuromuscular disorder ([place])    neuropathy feet   Pneumonia    Rectal prolapse    Past Surgical History:  Procedure Laterality Date   ABDOMINAL HYSTERECTOMY  1999   APPENDECTOMY  1962   BREAST CYST ASPIRATION Bilateral    CHOLECYSTECTOMY N/A 12/17/2018   Procedure: LAPAROSCOPIC CHOLECYSTECTOMY;  Surgeon: Clovis Riley, MD;  Location: [facility];  Service: General;  Laterality: N/A;   COLONOSCOPY     EYE SURGERY     cataract   JOINT REPLACEMENT     rt knee scope   REFRACTIVE SURGERY  2000   both eyes   retrocele repair     and bladder  sling   TOTAL KNEE ARTHROPLASTY Right 03/24/2015   Procedure: TOTAL KNEE ARTHROPLASTY;  Surgeon: [name], MD;  Location: [facility];  Service: Orthopedics;  Laterality: Right;   TOTAL KNEE ARTHROPLASTY Left 11/17/2020   Procedure: LEFT TOTAL KNEE ARTHROPLASTY;  Surgeon: [name], MD;  Location: [facility];  Service: Orthopedics;  Laterality: Left;   TUBAL LIGATION  1977   UPPER GASTROINTESTINAL ENDOSCOPY     Social History   Socioeconomic History   Marital status: Widowed    Spouse name: Not on file   Number of children: Not on file   Years of education: 12   Highest education level: Not on file  Occupational History   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    Employer: [employer]  Tobacco Use   Smoking status: Former    Types: Cigarettes    Quit date: 03/23/1976    Years since quitting: 45.9   Smokeless tobacco: Never  Vaping Use   Vaping Use: Never used  Substance and Sexual Activity   Alcohol use: Valentine   Drug use: Valentine   Sexual activity: Yes    Birth control/protection: None, Post-menopausal  Other Topics Concern   Not on file  Social History Narrative   Caffeine Use-yes   Regular exercise-Valentine   Right handed    Lives alone   Social Determinants of Health   Financial Resource Strain: Low Risk  (01/31/2022)   Overall Financial Resource Strain (CARDIA)    Difficulty of Paying Living Expenses: Not hard at all  Food Insecurity: Valentine Food Insecurity (01/31/2022)   Hunger Vital Sign    Worried About Running Out of Food in the Last Year: Never true    Ran Out of Food in the Last Year: Never true  Transportation Needs: Valentine Transportation Needs (01/31/2022)   PRAPARE - Hydrologist (Medical): Valentine    Lack of Transportation (Non-Medical): Valentine  Physical Activity: Insufficiently Active (01/31/2022)   Exercise Vital Sign    Days of Exercise per Week: 1 day    Minutes of Exercise per Session: 20 min  Stress: Valentine Stress Concern Present  (01/31/2022)   [place]    Feeling of Stress : Not at all  Social Connections: Moderately Isolated (01/27/2021)   Social Connection and Isolation Panel [NHANES]    Frequency of Communication with Friends and Family: Twice a week    Frequency of Social Gatherings with Friends and Family: Twice a week    Attends Religious Services: More than 4 times per year    Active Member of [employer] or Organizations: Valentine    Attends Archivist Meetings: Never    Marital Status: Widowed   Allergies  Allergen Reactions   Tetracycline Hives and Itching   Dilaudid [Hydromorphone Hcl] Nausea And Vomiting   Other     Cigarette smoke and perfumes--flares asthma (develops bronchitis)   Family History  Problem Relation Age of Onset   Stomach cancer Maternal Grandmother    Hypertension Maternal Grandmother    Diabetes Maternal Grandmother    Stroke Maternal Grandmother    Cancer Maternal Grandmother        Stomach cancer   Colon cancer Paternal Grandfather    Heart attack Paternal Grandfather    Hyperlipidemia Mother    Diabetes Mother    Hypertension Mother    Alzheimer's disease Mother    Diabetes Father    Hyperlipidemia Father    Heart disease Father    Heart attack Father    Stroke Paternal Grandmother     Current Outpatient Medications (Endocrine & Metabolic):    estradiol (ESTRACE) 0.5 MG tablet, Taper as directed. Valentine further refills.   levothyroxine (SYNTHROID) 112 MCG tablet, Take 1 tablet (112 mcg total) by mouth daily before breakfast.   predniSONE (STERAPRED UNI-PAK 21 TAB) 10 MG (21) TBPK tablet, Take by mouth daily. Take 6 tabs by mouth daily  for 1 days, then 5 tabs for 1 days, then 4 tabs for 1 days, then 3 tabs for 1 days, 2 tabs for 1 days, then 1 tab by mouth daily for 1 days   Current Outpatient Medications (Respiratory):    albuterol (VENTOLIN HFA) 108 (90 Base) MCG/ACT inhaler, Inhale into the lungs every  6 (six) hours as needed for wheezing or shortness of breath.   fluticasone (FLONASE) 50 MCG/ACT nasal spray, Place 1 spray into both nostrils in the morning and at bedtime.   fluticasone (FLOVENT HFA) 44 MCG/ACT inhaler, Inhale 2 puffs into the lungs 2 (two) times daily.   levocetirizine (XYZAL) 5 MG tablet, Take 1 tablet (5 mg total) by mouth every evening.  Current Outpatient Medications (Analgesics):  acetaminophen (TYLENOL) 325 MG tablet, Take 2 tablets (650 mg total) by mouth every 6 (six) hours as needed for moderate pain.   aspirin 81 MG chewable tablet, Chew 1 tablet (81 mg total) by mouth 2 (two) times daily.   Current Outpatient Medications (Other):    amoxicillin-clavulanate (AUGMENTIN) 875-125 MG tablet, Take 1 tablet by mouth 2 (two) times daily with a meal for 14 days.   Calcium Carbonate Antacid 1177 MG CHEW, Chew 1 tablet by mouth. As needed   Cranberry 12600 MG CAPS, Take 1 tablet by mouth. daily   diclofenac Sodium (VOLTAREN ARTHRITIS PAIN) 1 % GEL, Apply a small grape sized dollop 1 disorder 2 weeks of pends up to 4 times daily as needed   docusate sodium (COLACE) 250 MG capsule, Take 250 mg by mouth daily. Takes 4 daily   esomeprazole (NEXIUM) 40 MG capsule, Take 1 capsule (40 mg total) by mouth daily. Take medication 30 minutes before breakfast:   famotidine (PEPCID) 20 mg, Inject 20 mg into the vein every 12 (twelve) hours.   gabapentin (NEURONTIN) 300 MG capsule, TAKE 1 CAPSULE(300 MG) BY MOUTH TWICE DAILY FOR 7 DAYS THEN TAKE 1 CAPSULE(300 MG) BY MOUTH THREE TIMES DAILY FOR 23 DAYS   hydroxypropyl methylcellulose / hypromellose (ISOPTO TEARS / GONIOVISC) 2.5 % ophthalmic solution, Place 1 drop into both eyes 4 (four) times daily as needed for dry eyes.   Multiple Vitamins-Minerals (HAIR/SKIN/NAILS/BIOTIN PO), Take 3 tablets by mouth in the morning.   Sennosides 17.2 MG TABS, Take 1 tablet by mouth. Takes 6 times daily   potassium chloride SA (KLOR-CON M) 20 MEQ tablet,  Take 1 tablet (20 mEq total) by mouth daily.   Reviewed prior external information including notes and imaging from  primary care provider As well as notes that were available from care everywhere and other healthcare systems.  Past medical history, social, surgical and family history all reviewed in electronic medical record.  Valentine pertanent information unless stated regarding to the chief complaint.   Review of Systems:  Valentine headache, visual changes, nausea, vomiting, diarrhea, constipation, dizziness, abdominal pain, skin rash, fevers, chills, night sweats, weight loss, swollen lymph nodes,  joint swelling, chest pain, shortness of breath, mood changes. POSITIVE muscle aches, body aches  Objective  Blood pressure 112/72, pulse 73, height '5\' 5"'$  (1.651 m), weight 182 lb (82.6 kg), SpO2 99 %.   General: Valentine apparent distress alert and oriented x3 mood and affect normal, dressed appropriately.  HEENT: Pupils equal, extraocular movements intact  Respiratory: Patient's speak in full sentences and does not appear short of breath  Cardiovascular: Valentine lower extremity edema, non tender, Valentine erythema  Antalgic gait noted.  Left hip does have some limited range of motion noted.  Patient does have some difficulty noted.  This is more with internal range of motion noted.  Negative straight leg test noted today.  Tender over the greater trochanteric area in the left sacroiliac joint patient also has pain in the groin area as well with internal rotation.    Impression and Recommendations:    The above documentation has been reviewed and is accurate and complete Megan Pulley, DO

## 2022-02-17 NOTE — Patient Instructions (Addendum)
Brier B Hinojos, thank you for joining Chaney Malling, PA for today's virtual visit.  While this provider is not your primary care provider (PCP), if your PCP is located in our provider database this encounter information will be shared with them immediately following your visit.   [facility] account gives you access to today's visit and all your visits, tests, and labs performed at [facility] " click here if you don't have a [facility] account or go to [email or web address]  Consent: (Patient) Megan Valentine provided verbal consent for this virtual visit at the beginning of the encounter.  Current Medications:  Current Outpatient Medications:    amoxicillin-clavulanate (AUGMENTIN) 875-125 MG tablet, Take 1 tablet by mouth 2 (two) times daily with a meal for 14 days., Disp: 28 tablet, Rfl: 0   fluticasone (FLONASE) 50 MCG/ACT nasal spray, Place 1 spray into both nostrils in the morning and at bedtime., Disp: 16 g, Rfl: 0   predniSONE (STERAPRED UNI-PAK 21 TAB) 10 MG (21) TBPK tablet, Take by mouth daily. Take 6 tabs by mouth daily  for 1 days, then 5 tabs for 1 days, then 4 tabs for 1 days, then 3 tabs for 1 days, 2 tabs for 1 days, then 1 tab by mouth daily for 1 days, Disp: 21 tablet, Rfl: 0   acetaminophen (TYLENOL) 325 MG tablet, Take 2 tablets (650 mg total) by mouth every 6 (six) hours as needed for moderate pain., Disp: 30 tablet, Rfl: 0   albuterol (VENTOLIN HFA) 108 (90 Base) MCG/ACT inhaler, Inhale into the lungs every 6 (six) hours as needed for wheezing or shortness of breath., Disp: , Rfl:    aspirin 81 MG chewable tablet, Chew 1 tablet (81 mg total) by mouth 2 (two) times daily., Disp: , Rfl:    Calcium Carbonate Antacid 1177 MG CHEW, Chew 1 tablet by mouth. As needed, Disp: , Rfl:    Cranberry 12600 MG CAPS, Take 1 tablet by mouth. daily, Disp: , Rfl:    diclofenac Sodium (VOLTAREN ARTHRITIS PAIN) 1 % GEL, Apply a small grape sized dollop 1  disorder 2 weeks of pends up to 4 times daily as needed, Disp: 150 g, Rfl: 1   docusate sodium (COLACE) 250 MG capsule, Take 250 mg by mouth daily. Takes 4 daily, Disp: , Rfl:    esomeprazole (NEXIUM) 40 MG capsule, Take 1 capsule (40 mg total) by mouth daily. Take medication 30 minutes before breakfast:, Disp: 90 capsule, Rfl: 1   estradiol (ESTRACE) 0.5 MG tablet, Taper as directed. No further refills., Disp: 30 tablet, Rfl: 0   famotidine (PEPCID) 20 mg, Inject 20 mg into the vein every 12 (twelve) hours., Disp: , Rfl:    fluticasone (FLOVENT HFA) 44 MCG/ACT inhaler, Inhale 2 puffs into the lungs 2 (two) times daily., Disp: 1 each, Rfl: 6   gabapentin (NEURONTIN) 300 MG capsule, TAKE 1 CAPSULE(300 MG) BY MOUTH TWICE DAILY FOR 7 DAYS THEN TAKE 1 CAPSULE(300 MG) BY MOUTH THREE TIMES DAILY FOR 23 DAYS, Disp: 90 capsule, Rfl: 3   hydroxypropyl methylcellulose / hypromellose (ISOPTO TEARS / GONIOVISC) 2.5 % ophthalmic solution, Place 1 drop into both eyes 4 (four) times daily as needed for dry eyes., Disp: , Rfl:    levocetirizine (XYZAL) 5 MG tablet, Take 1 tablet (5 mg total) by mouth every evening., Disp: 30 tablet, Rfl: 0   levothyroxine (SYNTHROID) 112 MCG tablet, Take 1 tablet (112 mcg total) by mouth daily before breakfast., Disp:  90 tablet, Rfl: 3   Multiple Vitamins-Minerals (HAIR/SKIN/NAILS/BIOTIN PO), Take 3 tablets by mouth in the morning., Disp: , Rfl:    potassium chloride SA (KLOR-CON M) 20 MEQ tablet, Take 1 tablet (20 mEq total) by mouth daily., Disp: 90 tablet, Rfl: 0   Sennosides 17.2 MG TABS, Take 1 tablet by mouth. Takes 6 times daily, Disp: , Rfl:    Medications ordered in this encounter:  Meds ordered this encounter  Medications   amoxicillin-clavulanate (AUGMENTIN) 875-125 MG tablet    Sig: Take 1 tablet by mouth 2 (two) times daily with a meal for 14 days.    Dispense:  28 tablet    Refill:  0    Order Specific Question:   Supervising Provider    Answer:   Chase Picket [[id number]]   predniSONE (STERAPRED UNI-PAK 21 TAB) 10 MG (21) TBPK tablet    Sig: Take by mouth daily. Take 6 tabs by mouth daily  for 1 days, then 5 tabs for 1 days, then 4 tabs for 1 days, then 3 tabs for 1 days, 2 tabs for 1 days, then 1 tab by mouth daily for 1 days    Dispense:  21 tablet    Refill:  0    Order Specific Question:   Supervising Provider    Answer:   Chase Picket [[id number]]   fluticasone (FLONASE) 50 MCG/ACT nasal spray    Sig: Place 1 spray into both nostrils in the morning and at bedtime.    Dispense:  16 g    Refill:  0    Order Specific Question:   Supervising Provider    Answer:   Chase Picket [id number]     *If you need refills on other medications prior to your next appointment, please contact your pharmacy*  Follow-Up: Call back or seek an in-person evaluation if the symptoms worsen or if the condition fails to improve as anticipated.  [place] [phone]  Other Instructions You have a sinus infection, and possibly a R ear infection.  Please start taking the antibiotic, Augmentin, twice daily with food. Take it for all 10 days, do not stop early just because you feel better. If symptoms are not gone after 10 days, you may continue for a total of 14 days.  Take an over the counter probiotic or yogurt daily to help prevent diarrhea/ yeast infection.  Start taking prednisone per package directions daily to help clear up the mucous. Use Flonase daily to help with inflammation of the nasal passage. It is also recommended that you use nasal saline/ sinus washes to cleans the sinus passages.  Hot steam from a shower or vaporizer may also be beneficial to help open up the upper airway. Eucalyptus can be helpful.  If any worsening symptoms such as headache, fever, or shortness of breath, please go to an in person evaluation/ urgent care.    If you have been instructed to have an in-person evaluation today at a local Urgent Care  facility, please use the link below. It will take you to a list of all of our available [facility], including address, phone number and hours of operation. Please do not delay care.  [facility]  If you or a family member do not have a primary care provider, use the link below to schedule a visit and establish care. When you choose a [facility] primary care physician or advanced practice provider, you gain a  long-term partner in health. Find a Primary Care Provider  Learn more about [facility]'s in-office and virtual care options: [facility]

## 2022-02-21 ENCOUNTER — Ambulatory Visit (INDEPENDENT_AMBULATORY_CARE_PROVIDER_SITE_OTHER): Payer: PPO

## 2022-02-21 ENCOUNTER — Ambulatory Visit (INDEPENDENT_AMBULATORY_CARE_PROVIDER_SITE_OTHER): Payer: PPO | Admitting: Family Medicine

## 2022-02-21 VITALS — BP 112/72 | HR 73 | Ht 65.0 in | Wt 182.0 lb

## 2022-02-21 DIAGNOSIS — G8929 Other chronic pain: Secondary | ICD-10-CM

## 2022-02-21 DIAGNOSIS — M545 Low back pain, unspecified: Secondary | ICD-10-CM

## 2022-02-21 DIAGNOSIS — M25552 Pain in left hip: Secondary | ICD-10-CM

## 2022-02-21 NOTE — Patient Instructions (Signed)
MRA L hip [phone] Xray today on way out We will be in touch Can take up to 3 pills of gabapentin

## 2022-02-21 NOTE — Assessment & Plan Note (Signed)
Patient has not responded as well to the epidural that she has in the past.  Still having more back pain but seems to be now more associated with her left hip pain.  Does have some decreasing in range of motion noted which is different than patient's previous exam.  Antalgic gait noted as well.  Discussed with patient about icing regimen and home exercises, which activities to do and which ones to avoid.  Follow-up again in 6 to 8 weeks

## 2022-02-21 NOTE — Assessment & Plan Note (Signed)
Pain is out of proportion to the amount of palpation.  Discussed icing regimen.  Exercises, which activities to do and which ones to avoid.  Increase activity slowly.  Patient Dors is being pain that seems to be increasing difficulty with daily activities and now limited range of motion.  Concern for possible labral pathology.  X-rays and MR arthrogram ordered today.  Still having posterior pain as well that could be secondary to either the back or potentially more gluteal tearing.  Difficult to assess secondary to patient's severity of pain and will get imaging and further evaluate

## 2022-02-28 DIAGNOSIS — H40022 Open angle with borderline findings, high risk, left eye: Secondary | ICD-10-CM | POA: Diagnosis not present

## 2022-02-28 DIAGNOSIS — H40011 Open angle with borderline findings, low risk, right eye: Secondary | ICD-10-CM | POA: Diagnosis not present

## 2022-03-02 ENCOUNTER — Encounter: Payer: Self-pay | Admitting: Family Medicine

## 2022-03-02 ENCOUNTER — Other Ambulatory Visit: Payer: Self-pay

## 2022-03-02 DIAGNOSIS — J45909 Unspecified asthma, uncomplicated: Secondary | ICD-10-CM

## 2022-03-02 DIAGNOSIS — J4530 Mild persistent asthma, uncomplicated: Secondary | ICD-10-CM

## 2022-03-02 MED ORDER — ALBUTEROL SULFATE HFA 108 (90 BASE) MCG/ACT IN AERS: 1.0000 | INHALATION_SPRAY | Freq: Four times a day (QID) | RESPIRATORY_TRACT | 3 refills | Status: DC | PRN

## 2022-03-07 ENCOUNTER — Encounter: Payer: Self-pay | Admitting: Family Medicine

## 2022-03-07 MED ORDER — FLUTICASONE-SALMETEROL 230-21 MCG/ACT IN AERO: 2.0000 | INHALATION_SPRAY | Freq: Two times a day (BID) | RESPIRATORY_TRACT | 12 refills | Status: DC

## 2022-03-09 ENCOUNTER — Ambulatory Visit (INDEPENDENT_AMBULATORY_CARE_PROVIDER_SITE_OTHER): Payer: PPO | Admitting: Family Medicine

## 2022-03-09 ENCOUNTER — Encounter: Payer: Self-pay | Admitting: Family Medicine

## 2022-03-09 ENCOUNTER — Ambulatory Visit (INDEPENDENT_AMBULATORY_CARE_PROVIDER_SITE_OTHER)
Admission: RE | Admit: 2022-03-09 | Discharge: 2022-03-09 | Disposition: A | Discharge status: Home / Self Care | Payer: PPO | Source: Ambulatory Visit | Attending: Family Medicine | Admitting: Family Medicine

## 2022-03-09 VITALS — BP 122/70 | HR 74 | Temp 97.8°F | Ht 65.0 in | Wt 182.0 lb

## 2022-03-09 DIAGNOSIS — M419 Scoliosis, unspecified: Secondary | ICD-10-CM | POA: Diagnosis not present

## 2022-03-09 DIAGNOSIS — J301 Allergic rhinitis due to pollen: Secondary | ICD-10-CM

## 2022-03-09 DIAGNOSIS — R0989 Other specified symptoms and signs involving the circulatory and respiratory systems: Secondary | ICD-10-CM

## 2022-03-09 DIAGNOSIS — H6991 Unspecified Eustachian tube disorder, right ear: Secondary | ICD-10-CM | POA: Diagnosis not present

## 2022-03-09 MED ORDER — LEVOCETIRIZINE DIHYDROCHLORIDE 5 MG PO TABS: 5.0000 mg | ORAL_TABLET | Freq: Every evening | ORAL | 2 refills | Status: DC

## 2022-03-09 MED ORDER — METHYLPREDNISOLONE ACETATE 80 MG/ML IJ SUSP
80.0000 mg | Freq: Once | INTRAMUSCULAR | Status: AC
  Administered 2022-03-09: 80 mg via INTRAMUSCULAR

## 2022-03-09 NOTE — Progress Notes (Signed)
Established Patient Office Visit   Subjective:  Patient ID: Megan Valentine, female    DOB: July 19, 1950  Age: 72 y.o. MRN: [id number]  Chief Complaint  Patient presents with   Cough    Continued congestion x 1 month, poor energy. Would like another inhaler similar to Flovent.      Cough Associated symptoms include shortness of breath and wheezing. Pertinent negatives include no chills, eye redness, fever, myalgias or rash.   Encounter Diagnoses  Name Primary?   Seasonal allergic rhinitis due to pollen Yes   Dysfunction of right eustachian tube    Chest congestion    Presents with a 4 to 6-week history of congestion in her nose ears and chest.  She has been on 2 rounds of antibiotics most recently 14 days of Augmentin.  Occasional sneezing with burning itchy eyes postnasal drip tightness and wheezing in the chest.  She has been using her Flonase.  Flovent is no longer covered by her insurance.   Review of Systems  Constitutional: Negative.  Negative for chills, fever and malaise/fatigue.  HENT:  Positive for congestion. Negative for sinus pain.   Eyes:  Negative for blurred vision, discharge and redness.  Respiratory:  Positive for cough, shortness of breath and wheezing. Negative for sputum production.   Cardiovascular: Negative.   Gastrointestinal:  Negative for abdominal pain.  Genitourinary: Negative.   Musculoskeletal: Negative.  Negative for myalgias.  Skin:  Negative for rash.  Neurological:  Negative for tingling, loss of consciousness and weakness.  Endo/Heme/Allergies:  Negative for polydipsia.     Current Outpatient Medications:    acetaminophen (TYLENOL) 325 MG tablet, Take 2 tablets (650 mg total) by mouth every 6 (six) hours as needed for moderate pain., Disp: 30 tablet, Rfl: 0   albuterol (VENTOLIN HFA) 108 (90 Base) MCG/ACT inhaler, Inhale 1-2 puffs into the lungs every 6 (six) hours as needed for wheezing or shortness of breath., Disp: 8 g, Rfl: 3   aspirin  81 MG chewable tablet, Chew 1 tablet (81 mg total) by mouth 2 (two) times daily., Disp: , Rfl:    Calcium Carbonate Antacid 1177 MG CHEW, Chew 1 tablet by mouth. As needed, Disp: , Rfl:    Cranberry 12600 MG CAPS, Take 1 tablet by mouth. daily, Disp: , Rfl:    diclofenac Sodium (VOLTAREN ARTHRITIS PAIN) 1 % GEL, Apply a small grape sized dollop 1 disorder 2 weeks of pends up to 4 times daily as needed, Disp: 150 g, Rfl: 1   docusate sodium (COLACE) 250 MG capsule, Take 250 mg by mouth daily. Takes 4 daily, Disp: , Rfl:    esomeprazole (NEXIUM) 40 MG capsule, Take 1 capsule (40 mg total) by mouth daily. Take medication 30 minutes before breakfast:, Disp: 90 capsule, Rfl: 1   famotidine (PEPCID) 20 mg, Inject 20 mg into the vein every 12 (twelve) hours., Disp: , Rfl:    fluticasone (FLONASE) 50 MCG/ACT nasal spray, Place 1 spray into both nostrils in the morning and at bedtime., Disp: 16 g, Rfl: 0   fluticasone-salmeterol (ADVAIR HFA) 230-21 MCG/ACT inhaler, Inhale 2 puffs into the lungs 2 (two) times daily., Disp: 1 each, Rfl: 12   hydroxypropyl methylcellulose / hypromellose (ISOPTO TEARS / GONIOVISC) 2.5 % ophthalmic solution, Place 1 drop into both eyes 4 (four) times daily as needed for dry eyes., Disp: , Rfl:    levocetirizine (XYZAL) 5 MG tablet, Take 1 tablet (5 mg total) by mouth every evening., Disp: 30 tablet, Rfl:  2   levothyroxine (SYNTHROID) 112 MCG tablet, Take 1 tablet (112 mcg total) by mouth daily before breakfast., Disp: 90 tablet, Rfl: 3   Multiple Vitamins-Minerals (HAIR/SKIN/NAILS/BIOTIN PO), Take 3 tablets by mouth in the morning., Disp: , Rfl:    Sennosides 17.2 MG TABS, Take 1 tablet by mouth. Takes 6 times daily, Disp: , Rfl:   Current Facility-Administered Medications:    methylPREDNISolone acetate (DEPO-MEDROL) injection 80 mg, 80 mg, Intramuscular, Once, Libby Maw, MD   Objective:     BP 122/70 (BP Location: Right Arm, Patient Position: Sitting, Cuff Size:  Normal)   Pulse 74   Temp 97.8 F (36.6 C) (Temporal)   Ht 5' 5"$  (1.651 m)   Wt 182 lb (82.6 kg)   SpO2 98%   BMI 30.29 kg/m    Physical Exam Constitutional:      General: She is not in acute distress.    Appearance: Normal appearance. She is not ill-appearing, toxic-appearing or diaphoretic.  HENT:     Head: Normocephalic and atraumatic.     Right Ear: Tympanic membrane, ear canal and external ear normal.     Left Ear: Tympanic membrane, ear canal and external ear normal.     Mouth/Throat:     Mouth: Mucous membranes are moist.     Pharynx: Oropharynx is clear. No oropharyngeal exudate or posterior oropharyngeal erythema.  Eyes:     General: No scleral icterus.       Right eye: No discharge.        Left eye: No discharge.     Extraocular Movements: Extraocular movements intact.     Conjunctiva/sclera: Conjunctivae normal.     Pupils: Pupils are equal, round, and reactive to light.  Cardiovascular:     Rate and Rhythm: Normal rate and regular rhythm.  Pulmonary:     Effort: Pulmonary effort is normal. No respiratory distress.     Breath sounds: Normal breath sounds. No wheezing or rales.  Abdominal:     General: Bowel sounds are normal.     Tenderness: There is no abdominal tenderness. There is no guarding.  Musculoskeletal:     Cervical back: No rigidity or tenderness.  Skin:    General: Skin is warm and dry.  Neurological:     Mental Status: She is alert and oriented to person, place, and time.  Psychiatric:        Mood and Affect: Mood normal.        Behavior: Behavior normal.      No results found for any visits on 03/09/22.    The 10-year ASCVD risk score (Arnett DK, et al., 2019) is: 9.9%    Assessment & Plan:   Seasonal allergic rhinitis due to pollen -     methylPREDNISolone Acetate -     Levocetirizine Dihydrochloride; Take 1 tablet (5 mg total) by mouth every evening.  Dispense: 30 tablet; Refill: 2  Dysfunction of right eustachian tube -      methylPREDNISolone Acetate -     Levocetirizine Dihydrochloride; Take 1 tablet (5 mg total) by mouth every evening.  Dispense: 30 tablet; Refill: 2  Chest congestion -     methylPREDNISolone Acetate -     Levocetirizine Dihydrochloride; Take 1 tablet (5 mg total) by mouth every evening.  Dispense: 30 tablet; Refill: 2 -     DG Chest 2 View; Future    Return if symptoms worsen or fail to improve.  Start air Advair was sent in yesterday.  Warned patient  to look out for palpitations.  She will let me know.  Advised that Depo-Medrol will take a few days to become effective.  Advised her to practice eustachian tube exercises.  Start Xyzal.  Continue Flonase.  Libby Maw, MD

## 2022-03-17 ENCOUNTER — Other Ambulatory Visit: Payer: PPO

## 2022-03-17 ENCOUNTER — Encounter: Payer: Self-pay | Admitting: Family Medicine

## 2022-03-18 ENCOUNTER — Other Ambulatory Visit (HOSPITAL_COMMUNITY): Payer: Self-pay

## 2022-03-18 ENCOUNTER — Telehealth: Payer: Self-pay | Admitting: Family Medicine

## 2022-03-18 ENCOUNTER — Telehealth: Payer: Self-pay

## 2022-03-18 NOTE — Telephone Encounter (Signed)
Pharmacy Patient Advocate Encounter   Received notification that prior authorization for Fluticasone-Salmeterol 230-21MCG/ACT aerosol is required/requested.  Per Test Claim: PA required   PA submitted on 03/18/22 to (ins) [employer] via CoverMyMeds Key [id number] Status is pending

## 2022-03-18 NOTE — Telephone Encounter (Signed)
Returned call went over clinical question they will call back with determination.

## 2022-03-18 NOTE — Telephone Encounter (Signed)
[employer] [phone]  They need clinical information from an Ov about the Advair that was prescribed.

## 2022-03-20 ENCOUNTER — Encounter: Payer: Self-pay | Admitting: Family Medicine

## 2022-03-21 ENCOUNTER — Other Ambulatory Visit (HOSPITAL_COMMUNITY): Payer: Self-pay

## 2022-03-21 ENCOUNTER — Other Ambulatory Visit: Payer: Self-pay

## 2022-03-21 NOTE — Telephone Encounter (Signed)
Rx approved pt aware

## 2022-03-21 NOTE — Telephone Encounter (Signed)
Below PA approved

## 2022-03-21 NOTE — Telephone Encounter (Signed)
Patient Advocate Encounter  Prior Authorization for  Fluticasone-Salmeterol 230-21MCG/ACT aerosol  has been approved.    PA# [id number] Effective dates: 03/18/22 through 01/24/23

## 2022-03-21 NOTE — Telephone Encounter (Signed)
Patient requesting estradiol be added back to chart

## 2022-03-31 ENCOUNTER — Telehealth: Payer: Self-pay

## 2022-03-31 NOTE — Patient Instructions (Signed)
Visit Information  Thank you for taking time to visit with me today. Please don't hesitate to contact me if I can be of assistance to you.   Following are the goals we discussed today:   Goals Addressed             This Visit's Progress    COMPLETED: care coordination activities-no follow up required       Care Coordination Interventions: Advised patient to Annual Wellness exam. Discussed [facility] services and support. Assessed SDOH. Advised to discuss with primary care physician if services needed in the future.  Interventions Today    Flowsheet Row Most Recent Value  Chronic Disease   Chronic disease during today's visit Other  [Respiratory infections]  General Interventions   General Interventions Discussed/Reviewed General Interventions Discussed, Doctor Visits, Health Screening  Doctor Visits Discussed/Reviewed PCP  Health Screening Mammogram, Colonoscopy  PCP/Specialist Visits Compliance with follow-up visit  Education Interventions   Education Provided Provided Education  Provided Verbal Education On When to see the doctor             If you are experiencing a Mental Health or [place] or need someone to talk to, please call the Suicide and Crisis Lifeline: 988   Patient verbalizes understanding of instructions and care plan provided today and agrees to view in [place]. Active MyChart status and patient understanding of how to access instructions and care plan via MyChart confirmed with patient.     No further follow up required: decline  Jone Baseman, RN, MSN [place] [facility] Care Management Coordinator Direct Line [phone]

## 2022-03-31 NOTE — Patient Outreach (Signed)
  Care Coordination   Initial Visit Note   03/31/2022 Name: ILSE MCKERN MRN: [id number] DOB: 1950-09-02  [name] is a 72 y.o. year old female who sees Libby Maw, MD for primary care. I spoke with  Lady Gary Pensyl by phone today.  What matters to the patients health and wellness today?  none    Goals Addressed             This Visit's Progress    COMPLETED: care coordination activities-no follow up required       Care Coordination Interventions: Advised patient to Annual Wellness exam. Discussed [facility] services and support. Assessed SDOH. Advised to discuss with primary care physician if services needed in the future.  Interventions Today    Flowsheet Row Most Recent Value  Chronic Disease   Chronic disease during today's visit Other  [Respiratory infections]  General Interventions   General Interventions Discussed/Reviewed General Interventions Discussed, Doctor Visits, Health Screening  Doctor Visits Discussed/Reviewed PCP  Health Screening Mammogram, Colonoscopy  PCP/Specialist Visits Compliance with follow-up visit  Education Interventions   Education Provided Provided Education  Provided Verbal Education On When to see the doctor             SDOH assessments and interventions completed:  Yes  SDOH Interventions Today    Flowsheet Row Most Recent Value  SDOH Interventions   Transportation Interventions Intervention Not Indicated  Utilities Interventions Intervention Not Indicated        Care Coordination Interventions:  Yes, provided   Follow up plan: No further intervention required.   Encounter Outcome:  Pt. Visit Completed   Jone Baseman, RN, MSN [place] [facility] Care Management Coordinator Direct Line [phone]

## 2022-04-27 ENCOUNTER — Other Ambulatory Visit: Payer: Self-pay | Admitting: Nurse Practitioner

## 2022-04-27 DIAGNOSIS — K219 Gastro-esophageal reflux disease without esophagitis: Secondary | ICD-10-CM

## 2022-04-27 NOTE — Telephone Encounter (Signed)
Please advise 

## 2022-05-03 ENCOUNTER — Ambulatory Visit (INDEPENDENT_AMBULATORY_CARE_PROVIDER_SITE_OTHER): Payer: PPO | Admitting: Family Medicine

## 2022-05-03 ENCOUNTER — Encounter: Payer: Self-pay | Admitting: Family Medicine

## 2022-05-03 VITALS — BP 124/80 | HR 76 | Temp 98.1°F | Wt 183.4 lb

## 2022-05-03 DIAGNOSIS — E039 Hypothyroidism, unspecified: Secondary | ICD-10-CM | POA: Diagnosis not present

## 2022-05-03 DIAGNOSIS — E782 Mixed hyperlipidemia: Secondary | ICD-10-CM | POA: Diagnosis not present

## 2022-05-03 DIAGNOSIS — Z789 Other specified health status: Secondary | ICD-10-CM

## 2022-05-03 DIAGNOSIS — J4530 Mild persistent asthma, uncomplicated: Secondary | ICD-10-CM

## 2022-05-03 DIAGNOSIS — R0609 Other forms of dyspnea: Secondary | ICD-10-CM | POA: Diagnosis not present

## 2022-05-03 DIAGNOSIS — N1831 Chronic kidney disease, stage 3a: Secondary | ICD-10-CM

## 2022-05-03 LAB — CBC
HCT: 41.6 % (ref 36.0–46.0)
Hemoglobin: 14.1 g/dL (ref 12.0–15.0)
MCHC: 33.9 g/dL (ref 30.0–36.0)
MCV: 94 fl (ref 78.0–100.0)
Platelets: 351 10*3/uL (ref 150.0–400.0)
RBC: 4.43 Mil/uL (ref 3.87–5.11)
RDW: 14 % (ref 11.5–15.5)
WBC: 5.8 10*3/uL (ref 4.0–10.5)

## 2022-05-03 LAB — BASIC METABOLIC PANEL
BUN: 15 mg/dL (ref 6–23)
CO2: 25 mEq/L (ref 19–32)
Calcium: 9.5 mg/dL (ref 8.4–10.5)
Chloride: 107 mEq/L (ref 96–112)
Creatinine, Ser: 0.98 mg/dL (ref 0.40–1.20)
GFR: 57.82 mL/min — ABNORMAL LOW (ref >60.00)
Glucose, Bld: 94 mg/dL (ref 70–99)
Potassium: 4.4 mEq/L (ref 3.5–5.1)
Sodium: 142 mEq/L (ref 135–145)

## 2022-05-03 LAB — TSH: TSH: 4.53 u[IU]/mL (ref 0.35–5.50)

## 2022-05-03 MED ORDER — EZETIMIBE 10 MG PO TABS: 10.0000 mg | ORAL_TABLET | Freq: Every day | ORAL | 3 refills | Status: DC

## 2022-05-03 NOTE — Progress Notes (Signed)
Established Patient Office Visit   Subjective:  Patient ID: Megan Valentine, female    DOB: 1950/11/27  Age: 72 y.o. MRN: [id number]  Chief Complaint  Patient presents with   Weight gain    Patient has experienced weight gain, dry skin, stomach bloating and feeling cold for months. Would like thyroid check.     HPI Encounter Diagnoses  Name Primary?   Statin intolerance Yes   Hypothyroidism, unspecified type    Mixed hyperlipidemia    Stage 3a chronic kidney disease    DOE (dyspnea on exertion)    Mild persistent asthma without complication    As above.  Reports dyspnea with minor exertion.  While she does experience left upper chest pain it is not associated with exertion or her dyspnea.  Typically she is not physically active.  She does have a mildly elevated 10-year risk score but is statin intolerant.  Has been resistant to treatment.  Feels as though her asthma is well-controlled with Advair.  She continues to feel some congestion.  There have been no fevers or chills.  No chronic phlegm production.  She reports dry skin even though she uses mild soaps and moisturizing lotions.  She reports some fatigue.  She assures me that she is compliant with her levothyroxine and takes it on a fasting stomach each day.  She says that she has not missed doses.  She reports abdominal bloating.  She suffers from chronic constipation.  She uses a stimulant laxative and Colace.  We have had multiple discussions about using stimulative laxatives.  She tells me that her GI doctor is okay with this.   Review of Systems  Constitutional:  Positive for malaise/fatigue. Negative for diaphoresis and weight loss.  HENT: Negative.    Eyes:  Negative for blurred vision, discharge and redness.  Respiratory: Negative.    Cardiovascular: Negative.   Gastrointestinal:  Positive for constipation. Negative for abdominal pain, blood in stool and melena.  Genitourinary: Negative.   Musculoskeletal: Negative.   Negative for myalgias.  Skin:  Negative for rash.  Neurological:  Negative for tingling, loss of consciousness and weakness.  Endo/Heme/Allergies:  Negative for polydipsia.      03/31/2022   12:30 PM 03/09/2022    8:09 AM 01/31/2022    2:34 PM  Depression screen PHQ 2/9  Decreased Interest 0 0 0  Down, Depressed, Hopeless 0 0 0  PHQ - 2 Score 0 0 0      Current Outpatient Medications:    acetaminophen (TYLENOL) 325 MG tablet, Take 2 tablets (650 mg total) by mouth every 6 (six) hours as needed for moderate pain., Disp: 30 tablet, Rfl: 0   albuterol (VENTOLIN HFA) 108 (90 Base) MCG/ACT inhaler, Inhale 1-2 puffs into the lungs every 6 (six) hours as needed for wheezing or shortness of breath., Disp: 8 g, Rfl: 3   aspirin 81 MG chewable tablet, Chew 1 tablet (81 mg total) by mouth 2 (two) times daily., Disp: , Rfl:    Calcium Carbonate Antacid 1177 MG CHEW, Chew 1 tablet by mouth. As needed, Disp: , Rfl:    Cranberry 12600 MG CAPS, Take 1 tablet by mouth. daily, Disp: , Rfl:    diclofenac Sodium (VOLTAREN ARTHRITIS PAIN) 1 % GEL, Apply a small grape sized dollop 1 disorder 2 weeks of pends up to 4 times daily as needed, Disp: 150 g, Rfl: 1   docusate sodium (COLACE) 250 MG capsule, Take 250 mg by mouth daily. Takes 4 daily,  Disp: , Rfl:    esomeprazole (NEXIUM) 40 MG capsule, TAKE 1 CAPSULE BY MOUTH ONCE DAILY 30 MINUTES BEFORE BREAKFAST, Disp: 90 capsule, Rfl: 1   estradiol (ESTRACE) 0.5 MG tablet, Take 0.5 mg by mouth daily., Disp: , Rfl:    ezetimibe (ZETIA) 10 MG tablet, Take 1 tablet (10 mg total) by mouth daily., Disp: 90 tablet, Rfl: 3   famotidine (PEPCID) 20 mg, Inject 20 mg into the vein every 12 (twelve) hours., Disp: , Rfl:    fluticasone (FLONASE) 50 MCG/ACT nasal spray, Place 1 spray into both nostrils in the morning and at bedtime., Disp: 16 g, Rfl: 0   fluticasone-salmeterol (ADVAIR HFA) 230-21 MCG/ACT inhaler, Inhale 2 puffs into the lungs 2 (two) times daily., Disp: 1 each,  Rfl: 12   hydroxypropyl methylcellulose / hypromellose (ISOPTO TEARS / GONIOVISC) 2.5 % ophthalmic solution, Place 1 drop into both eyes 4 (four) times daily as needed for dry eyes., Disp: , Rfl:    levocetirizine (XYZAL) 5 MG tablet, Take 1 tablet (5 mg total) by mouth every evening., Disp: 30 tablet, Rfl: 2   levothyroxine (SYNTHROID) 112 MCG tablet, Take 1 tablet (112 mcg total) by mouth daily before breakfast., Disp: 90 tablet, Rfl: 3   Multiple Vitamins-Minerals (HAIR/SKIN/NAILS/BIOTIN PO), Take 3 tablets by mouth in the morning., Disp: , Rfl:    Sennosides 17.2 MG TABS, Take 1 tablet by mouth. Takes 6 times daily, Disp: , Rfl:    Objective:     BP 124/80 (BP Location: Right Arm, Patient Position: Sitting, Cuff Size: Large)   Pulse 76   Temp 98.1 F (36.7 C) (Temporal)   Wt 183 lb 6.4 oz (83.2 kg)   SpO2 96%   BMI 30.52 kg/m  Wt Readings from Last 3 Encounters:  05/03/22 183 lb 6.4 oz (83.2 kg)  03/09/22 182 lb (82.6 kg)  02/21/22 182 lb (82.6 kg)      Physical Exam Constitutional:      General: She is not in acute distress.    Appearance: Normal appearance. She is not ill-appearing, toxic-appearing or diaphoretic.  HENT:     Head: Normocephalic and atraumatic.     Right Ear: External ear normal.     Left Ear: External ear normal.     Mouth/Throat:     Mouth: Mucous membranes are moist.     Pharynx: Oropharynx is clear. No oropharyngeal exudate or posterior oropharyngeal erythema.  Eyes:     General: No scleral icterus.       Right eye: No discharge.        Left eye: No discharge.     Extraocular Movements: Extraocular movements intact.     Conjunctiva/sclera: Conjunctivae normal.     Pupils: Pupils are equal, round, and reactive to light.  Cardiovascular:     Rate and Rhythm: Normal rate and regular rhythm.  Pulmonary:     Effort: Pulmonary effort is normal. No respiratory distress.     Breath sounds: Normal breath sounds. No wheezing, rhonchi or rales.   Abdominal:     General: Bowel sounds are normal.  Musculoskeletal:     Cervical back: No rigidity or tenderness.  Skin:    General: Skin is warm and dry.  Neurological:     Mental Status: She is alert and oriented to person, place, and time.  Psychiatric:        Mood and Affect: Mood normal.        Behavior: Behavior normal.      No  results found for any visits on 05/03/22.    The 10-year ASCVD risk score (Arnett DK, et al., 2019) is: 11.3%    Assessment & Plan:   Statin intolerance -     Ezetimibe; Take 1 tablet (10 mg total) by mouth daily.  Dispense: 90 tablet; Refill: 3  Hypothyroidism, unspecified type -     TSH  Mixed hyperlipidemia -     Ezetimibe; Take 1 tablet (10 mg total) by mouth daily.  Dispense: 90 tablet; Refill: 3 -     Ambulatory referral to Cardiology  Stage 3a chronic kidney disease -     Basic metabolic panel  DOE (dyspnea on exertion) -     CBC -     Ambulatory referral to Cardiology -     Ambulatory referral to Pulmonology  Mild persistent asthma without complication -     Ambulatory referral to Pulmonology    Return in about 3 months (around 08/02/2022).   Agrees to take Zetia.  Agrees to go for cardiac and pulmonary consultations.  Follow-up in 3 months. Mliss Sax, MD

## 2022-05-06 ENCOUNTER — Encounter: Payer: Self-pay | Admitting: Pulmonary Disease

## 2022-05-06 ENCOUNTER — Ambulatory Visit: Payer: PPO | Admitting: Pulmonary Disease

## 2022-05-06 VITALS — BP 118/74 | HR 79 | Ht 65.0 in | Wt 184.0 lb

## 2022-05-06 DIAGNOSIS — J4520 Mild intermittent asthma, uncomplicated: Secondary | ICD-10-CM

## 2022-05-06 LAB — CBC WITH DIFFERENTIAL/PLATELET
Basophils Absolute: 0.1 10*3/uL (ref 0.0–0.1)
Basophils Relative: 0.9 % (ref 0.0–3.0)
Eosinophils Absolute: 0.3 10*3/uL (ref 0.0–0.7)
Eosinophils Relative: 3.2 % (ref 0.0–5.0)
HCT: 39.5 % (ref 36.0–46.0)
Hemoglobin: 13.5 g/dL (ref 12.0–15.0)
Lymphocytes Relative: 25.8 % (ref 12.0–46.0)
Lymphs Abs: 2.2 10*3/uL (ref 0.7–4.0)
MCHC: 34 g/dL (ref 30.0–36.0)
MCV: 93.8 fl (ref 78.0–100.0)
Monocytes Absolute: 0.8 10*3/uL (ref 0.1–1.0)
Monocytes Relative: 9.9 % (ref 3.0–12.0)
Neutro Abs: 5 10*3/uL (ref 1.4–7.7)
Neutrophils Relative %: 60.2 % (ref 43.0–77.0)
Platelets: 347 10*3/uL (ref 150.0–400.0)
RBC: 4.22 Mil/uL (ref 3.87–5.11)
RDW: 13.9 % (ref 11.5–15.5)
WBC: 8.3 10*3/uL (ref 4.0–10.5)

## 2022-05-06 LAB — NITRIC OXIDE: Nitric Oxide: 34

## 2022-05-06 MED ORDER — BREZTRI AEROSPHERE 160-9-4.8 MCG/ACT IN AERO: 2.0000 | INHALATION_SPRAY | Freq: Two times a day (BID) | RESPIRATORY_TRACT | 0 refills | Status: DC

## 2022-05-06 MED ORDER — BUDESONIDE-FORMOTEROL FUMARATE 160-4.5 MCG/ACT IN AERO: 2.0000 | INHALATION_SPRAY | Freq: Two times a day (BID) | RESPIRATORY_TRACT | 6 refills | Status: DC

## 2022-05-06 NOTE — Progress Notes (Signed)
Megan Valentine    [id number]    27-Dec-1950  Primary Care Physician:Kremer, Talmadge Coventry, MD  Referring Physician: Mliss Sax, MD [address] [place],  [place] [address]  Chief complaint: Consult for asthma  HPI: 72 y.o. who  has a past medical history of Allergy, Arthritis, Asthma, Cataract, Chronic bronchitis, Chronic kidney disease, Complication of anesthesia, Diverticulosis, Environmental allergies, GERD (gastroesophageal reflux disease), Hiatal hernia, History of chicken pox, Hyperlipidemia, Hypothyroidism, IBS (irritable bowel syndrome), Migraines, Mitral valve prolapse, Neuromuscular disorder, Pneumonia, and Rectal prolapse.   Referred here for evaluation of shortness of breath, coughing for the past 4 months.  Symptoms are associated with occasional wheezing, chest congestion She has history of seasonal allergies, chronic kidney disease  Previously been on Flovent but it is not covered by her insurance.  Advair was prescribed by her PCP but was too expensive for her.  Pets: Has a dog Occupation: Works as a proofread out Exposures: No exposures.  Denies any exposure to mold, hot tub, Jacuzzi or feather pillows or comforters Smoking history: 13-pack-year smoker.  Quit in 1979 Travel history: No significant travel history Relevant family history: Dad had asthma   Outpatient Encounter Medications as of 05/06/2022  Medication Sig   acetaminophen (TYLENOL) 325 MG tablet Take 2 tablets (650 mg total) by mouth every 6 (six) hours as needed for moderate pain.   albuterol (VENTOLIN HFA) 108 (90 Base) MCG/ACT inhaler Inhale 1-2 puffs into the lungs every 6 (six) hours as needed for wheezing or shortness of breath.   aspirin 81 MG chewable tablet Chew 1 tablet (81 mg total) by mouth 2 (two) times daily.   Calcium Carbonate Antacid 1177 MG CHEW Chew 1 tablet by mouth. As needed   Cranberry 12600 MG CAPS Take 1 tablet by mouth. daily   diclofenac Sodium  (VOLTAREN ARTHRITIS PAIN) 1 % GEL Apply a small grape sized dollop 1 disorder 2 weeks of pends up to 4 times daily as needed   docusate sodium (COLACE) 250 MG capsule Take 250 mg by mouth daily. Takes 4 daily   esomeprazole (NEXIUM) 40 MG capsule TAKE 1 CAPSULE BY MOUTH ONCE DAILY 30 MINUTES BEFORE BREAKFAST   estradiol (ESTRACE) 0.5 MG tablet Take 0.5 mg by mouth daily.   ezetimibe (ZETIA) 10 MG tablet Take 1 tablet (10 mg total) by mouth daily.   famotidine (PEPCID) 20 mg Inject 20 mg into the vein every 12 (twelve) hours.   fluticasone (FLONASE) 50 MCG/ACT nasal spray Place 1 spray into both nostrils in the morning and at bedtime.   fluticasone-salmeterol (ADVAIR HFA) 230-21 MCG/ACT inhaler Inhale 2 puffs into the lungs 2 (two) times daily.   hydroxypropyl methylcellulose / hypromellose (ISOPTO TEARS / GONIOVISC) 2.5 % ophthalmic solution Place 1 drop into both eyes 4 (four) times daily as needed for dry eyes.   levocetirizine (XYZAL) 5 MG tablet Take 1 tablet (5 mg total) by mouth every evening.   levothyroxine (SYNTHROID) 112 MCG tablet Take 1 tablet (112 mcg total) by mouth daily before breakfast.   Multiple Vitamins-Minerals (HAIR/SKIN/NAILS/BIOTIN PO) Take 3 tablets by mouth in the morning.   Sennosides 17.2 MG TABS Take 1 tablet by mouth. Takes 6 times daily   No facility-administered encounter medications on file as of 05/06/2022.    Allergies as of 05/06/2022 - Review Complete 05/06/2022  Allergen Reaction Noted   Tetracycline Hives and Itching    Dilaudid [hydromorphone hcl] Nausea And Vomiting 12/14/2018  Other  07/11/2019    Past Medical History:  Diagnosis Date   Allergy    Arthritis    Asthma    Cataract    Chronic bronchitis    Chronic kidney disease    stage 3, kidney infections   Complication of anesthesia    hard to wake up   Diverticulosis    Environmental allergies    GERD (gastroesophageal reflux disease)    Hiatal hernia    History of chicken pox     Hyperlipidemia    Hypothyroidism    IBS (irritable bowel syndrome)    Migraines    Mitral valve prolapse    Per pt . per Dr.Henry  [name].  does not have MVP   Neuromuscular disorder    neuropathy feet   Pneumonia    Rectal prolapse     Past Surgical History:  Procedure Laterality Date   ABDOMINAL HYSTERECTOMY  1999   APPENDECTOMY  1962   BREAST CYST ASPIRATION Bilateral    CHOLECYSTECTOMY N/A 12/17/2018   Procedure: LAPAROSCOPIC CHOLECYSTECTOMY;  Surgeon: Berna Bue, MD;  Location: [facility] OR;  Service: General;  Laterality: N/A;   COLONOSCOPY     EYE SURGERY     cataract   JOINT REPLACEMENT     rt knee scope   REFRACTIVE SURGERY  2000   both eyes   retrocele repair     and bladder sling   TOTAL KNEE ARTHROPLASTY Right 03/24/2015   Procedure: TOTAL KNEE ARTHROPLASTY;  Surgeon: Valeria Batman, MD;  Location: [facility] OR;  Service: Orthopedics;  Laterality: Right;   TOTAL KNEE ARTHROPLASTY Left 11/17/2020   Procedure: LEFT TOTAL KNEE ARTHROPLASTY;  Surgeon: Valeria Batman, MD;  Location: [facility] ORS;  Service: Orthopedics;  Laterality: Left;   TUBAL LIGATION  1977   UPPER GASTROINTESTINAL ENDOSCOPY      Family History  Problem Relation Age of Onset   Stomach cancer Maternal Grandmother    Hypertension Maternal Grandmother    Diabetes Maternal Grandmother    Stroke Maternal Grandmother    Cancer Maternal Grandmother        Stomach cancer   Colon cancer Paternal Grandfather    Heart attack Paternal Grandfather    Hyperlipidemia Mother    Diabetes Mother    Hypertension Mother    Alzheimer's disease Mother    Diabetes Father    Hyperlipidemia Father    Heart disease Father    Heart attack Father    Stroke Paternal Grandmother     Social History   Socioeconomic History   Marital status: Widowed    Spouse name: Not on file   Number of children: Not on file   Years of education: 12   Highest education level: 12th grade  Occupational History   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    Employer: [employer]  Tobacco Use   Smoking status: Former    Types: Cigarettes    Quit date: 03/23/1976    Years since quitting: 46.1   Smokeless tobacco: Never  Vaping Use   Vaping Use: Never used  Substance and Sexual Activity   Alcohol use: No   Drug use: No   Sexual activity: Yes    Birth control/protection: None, Post-menopausal  Other Topics Concern   Not on file  Social History Narrative   Caffeine Use-yes   Regular exercise-no   Right handed    Lives alone   Social Determinants of Health   Financial Resource Strain:  Low Risk  (04/29/2022)   Overall Financial Resource Strain (CARDIA)    Difficulty of Paying Living Expenses: Not very hard  Food Insecurity: No Food Insecurity (04/29/2022)   Hunger Vital Sign    Worried About Running Out of Food in the Last Year: Never true    Ran Out of Food in the Last Year: Never true  Transportation Needs: No Transportation Needs (04/29/2022)   PRAPARE - Administrator, Civil Service (Medical): No    Lack of Transportation (Non-Medical): No  Physical Activity: Insufficiently Active (04/29/2022)   Exercise Vital Sign    Days of Exercise per Week: 2 days    Minutes of Exercise per Session: 30 min  Stress: No Stress Concern Present (04/29/2022)   [employer] of Occupational Health - Occupational Stress Questionnaire    Feeling of Stress : Only a little  Social Connections: Moderately Integrated (04/29/2022)   Social Connection and Isolation Panel [NHANES]    Frequency of Communication with Friends and Family: More than three times a week    Frequency of Social Gatherings with Friends and Family: Twice a week    Attends Religious Services: More than 4 times per year    Active Member of [employer] or Organizations: Yes    Attends Banker Meetings: More than 4 times per year    Marital Status: Widowed  Intimate Partner Violence: Not At Risk (01/27/2021)   Humiliation,  Afraid, Rape, and Kick questionnaire    Fear of Current or Ex-Partner: No    Emotionally Abused: No    Physically Abused: No    Sexually Abused: No    Review of systems: Review of Systems  Constitutional: Negative for fever and chills.  HENT: Negative.   Eyes: Negative for blurred vision.  Respiratory: as per HPI  Cardiovascular: Negative for chest pain and palpitations.  Gastrointestinal: Negative for vomiting, diarrhea, blood per rectum. Genitourinary: Negative for dysuria, urgency, frequency and hematuria.  Musculoskeletal: Negative for myalgias, back pain and joint pain.  Skin: Negative for itching and rash.  Neurological: Negative for dizziness, tremors, focal weakness, seizures and loss of consciousness.  Endo/Heme/Allergies: Negative for environmental allergies.  Psychiatric/Behavioral: Negative for depression, suicidal ideas and hallucinations.  All other systems reviewed and are negative.  Physical Exam: Blood pressure 118/74, pulse 79, height  (1.651 m), weight 184 lb (83.5 kg), SpO2 99 %. Gen:      No acute distress HEENT:  EOMI, sclera anicteric Neck:     No masses; no thyromegaly Lungs:    Clear to auscultation bilaterally; normal respiratory effort CV:         Regular rate and rhythm; no murmurs Abd:      + bowel sounds; soft, non-tender; no palpable masses, no distension Ext:    No edema; adequate peripheral perfusion Skin:      Warm and dry; no rash Neuro: alert and oriented x 3 Psych: normal mood and affect  Data Reviewed: Imaging: Chest x-ray 03/11/2022-no acute cardiopulmonary abnormality I have reviewed the images personally.  PFTs:  FENO 05/06/2022- 34  Labs:   Assessment:  Asthma Will give sample of breztri and send a prescription.  Will need to check with insurance to see which type of inhalers are covered Check CBC differential, IgE Schedule PFTs and follow-up in 1 to 2 months  Plan/Recommendations: Breztri CBC, IgE PFTs  Chilton Greathouse MD [facility] 05/06/2022, 1:09 PM  CC: Mliss Sax,*

## 2022-05-06 NOTE — Patient Instructions (Signed)
Will check FENO today Check CBC differential, IgE Will give a sample of Breztri and send in a prescription for same to your pharmacy I will also check with the pharmacy to see which inhaler is covered for you Schedule PFTs.  Follow-up in 1 to 2 months.

## 2022-05-09 LAB — IGE: IgE (Immunoglobulin E), Serum: 43 kU/L (ref <=114)

## 2022-05-24 ENCOUNTER — Other Ambulatory Visit (HOSPITAL_COMMUNITY): Payer: Self-pay

## 2022-05-29 ENCOUNTER — Encounter: Payer: Self-pay | Admitting: Pulmonary Disease

## 2022-05-30 ENCOUNTER — Other Ambulatory Visit: Payer: Self-pay | Admitting: Family Medicine

## 2022-05-30 DIAGNOSIS — J301 Allergic rhinitis due to pollen: Secondary | ICD-10-CM

## 2022-05-30 DIAGNOSIS — H6991 Unspecified Eustachian tube disorder, right ear: Secondary | ICD-10-CM

## 2022-05-30 DIAGNOSIS — R0989 Other specified symptoms and signs involving the circulatory and respiratory systems: Secondary | ICD-10-CM

## 2022-06-01 NOTE — Telephone Encounter (Signed)
Pharmacy team, can we see if Markus Daft is covered and if so, what her copay will be?   Thanks!

## 2022-06-02 ENCOUNTER — Other Ambulatory Visit (HOSPITAL_COMMUNITY): Payer: Self-pay

## 2022-06-06 MED ORDER — BREZTRI AEROSPHERE 160-9-4.8 MCG/ACT IN AERO: 2.0000 | INHALATION_SPRAY | Freq: Two times a day (BID) | RESPIRATORY_TRACT | 11 refills | Status: DC

## 2022-06-06 NOTE — Telephone Encounter (Signed)
Application was placed behind check in desk for pt pick up and refill sent into verified pharmacy. Nothing further needed at this time.

## 2022-06-10 ENCOUNTER — Telehealth: Payer: Self-pay | Admitting: Pulmonary Disease

## 2022-06-10 DIAGNOSIS — R0609 Other forms of dyspnea: Secondary | ICD-10-CM | POA: Insufficient documentation

## 2022-06-10 NOTE — Telephone Encounter (Signed)
Patient dropped off forms for patient assistance. Put in Dr. Pollyann Glen box. Patient phone number is [phone].

## 2022-06-14 NOTE — Telephone Encounter (Signed)
This is not a triage message. It can be send to [facility] nurse or pharmacy.

## 2022-06-15 ENCOUNTER — Encounter (HOSPITAL_BASED_OUTPATIENT_CLINIC_OR_DEPARTMENT_OTHER): Payer: Self-pay

## 2022-06-15 NOTE — Telephone Encounter (Signed)
Forms have been obtained and will fill out and have Dr. Isaiah Serge sign.

## 2022-06-16 ENCOUNTER — Other Ambulatory Visit: Payer: Self-pay | Admitting: Family Medicine

## 2022-06-16 ENCOUNTER — Other Ambulatory Visit: Payer: Self-pay

## 2022-06-16 ENCOUNTER — Encounter: Payer: Self-pay | Admitting: Interventional Cardiology

## 2022-06-16 ENCOUNTER — Ambulatory Visit: Payer: PPO | Attending: Interventional Cardiology | Admitting: Interventional Cardiology

## 2022-06-16 VITALS — BP 122/78 | HR 80 | Ht 64.0 in | Wt 181.6 lb

## 2022-06-16 DIAGNOSIS — R7303 Prediabetes: Secondary | ICD-10-CM

## 2022-06-16 DIAGNOSIS — N1831 Chronic kidney disease, stage 3a: Secondary | ICD-10-CM

## 2022-06-16 DIAGNOSIS — R0609 Other forms of dyspnea: Secondary | ICD-10-CM | POA: Diagnosis not present

## 2022-06-16 DIAGNOSIS — R0789 Other chest pain: Secondary | ICD-10-CM | POA: Diagnosis not present

## 2022-06-16 DIAGNOSIS — E782 Mixed hyperlipidemia: Secondary | ICD-10-CM

## 2022-06-16 DIAGNOSIS — J45909 Unspecified asthma, uncomplicated: Secondary | ICD-10-CM

## 2022-06-16 MED ORDER — METOPROLOL TARTRATE 100 MG PO TABS: ORAL_TABLET | ORAL | 0 refills | Status: DC

## 2022-06-16 NOTE — Patient Instructions (Signed)
Medication Instructions:  Your physician recommends that you continue on your current medications as directed. Please refer to the Current Medication list given to you today.  *If you need a refill on your cardiac medications before your next appointment, please call your pharmacy*   Lab Work: Lab work to be done today--BMP If you have labs (blood work) drawn today and your tests are completely normal, you will receive your results only by: MyChart Message (if you have MyChart) OR A paper copy in the mail If you have any lab test that is abnormal or we need to change your treatment, we will call you to review the results.   Testing/Procedures: Your physician has requested that you have an echocardiogram. Echocardiography is a painless test that uses sound waves to create images of your heart. It provides your doctor with information about the size and shape of your heart and how well your heart's chambers and valves are working. This procedure takes approximately one hour. There are no restrictions for this procedure. Please do NOT wear cologne, perfume, aftershave, or lotions (deodorant is allowed). Please arrive 15 minutes prior to your appointment time.  Your physician has requested that you have cardiac CT. Cardiac computed tomography (CT) is a painless test that uses an x-ray machine to take clear, detailed pictures of your heart. For further information please visit [email or web address] Please follow instruction sheet as given.     Follow-Up: At [facility], you and your health needs are our priority.  As part of our continuing mission to provide you with exceptional heart care, we have created designated Provider Care Teams.  These Care Teams include your primary Cardiologist (physician) and Advanced Practice Providers (APPs -  Physician Assistants and Nurse Practitioners) who all work together to provide you with the care you need, when you need it.  We recommend signing  up for the patient portal called "MyChart".  Sign up information is provided on this After Visit Summary.  MyChart is used to connect with patients for Virtual Visits (Telemedicine).  Patients are able to view lab/test results, encounter notes, upcoming appointments, etc.  Non-urgent messages can be sent to your provider as well.   To learn more about what you can do with MyChart, go to [email or web address].    Your next appointment:   Based on results  Provider:   Lance Muss, MD     Other Instructions    Your cardiac CT will be scheduled at one of the below locations:   [facility] [address] [place], [place] [address] [phone]  OR  [facility] [address] [place], [place] [address] [phone]  OR   [facility] [address] [place], [place] [address] [phone]  If scheduled at [facility], please arrive at the [facility] Entrance (Entrance C2) of [facility] 30 minutes prior to test start time. You can use the FREE valet parking offered at entrance C (encouraged to control the heart rate for the test)  Proceed to the [facility] Radiology Department (first floor) to check-in and test prep.  All radiology patients and guests should use entrance C2 at [facility], accessed from [facility], even though the hospital's physical address listed is [address].    If scheduled at [facility] or [facility], please arrive 15 mins early for check-in and test prep.   Please follow  these instructions carefully (unless otherwise directed):   On the Night Before the Test: Be sure to Drink plenty of water. Do not consume any caffeinated/decaffeinated beverages or chocolate 12 hours prior to your test. Do not take any antihistamines 12 hours prior to your  test.   On the Day of the Test: Drink plenty of water until 1 hour prior to the test. Do not eat any food 1 hour prior to test. You may take your regular medications prior to the test.  Take metoprolol (Lopressor) two hours prior to test. If you take Furosemide/Hydrochlorothiazide/Spironolactone, please HOLD on the morning of the test. FEMALES- please wear underwire-free bra if available, avoid dresses & tight clothing         After the Test: Drink plenty of water. After receiving IV contrast, you may experience a mild flushed feeling. This is normal. On occasion, you may experience a mild rash up to 24 hours after the test. This is not dangerous. If this occurs, you can take Benadryl 25 mg and increase your fluid intake. If you experience trouble breathing, this can be serious. If it is severe call 911 IMMEDIATELY. If it is mild, please call our office. If you take any of these medications: Glipizide/Metformin, Avandament, Glucavance, please do not take 48 hours after completing test unless otherwise instructed.  We will call to schedule your test 2-4 weeks out understanding that some insurance companies will need an authorization prior to the service being performed.   For non-scheduling related questions, please contact the cardiac imaging nurse navigator should you have any questions/concerns: Rockwell Alexandria, Cardiac Imaging Nurse Navigator Larey Brick, Cardiac Imaging Nurse Navigator [facility] Direct Office Dial: [phone]   For scheduling needs, including cancellations and rescheduling, please call [place], [phone].

## 2022-06-16 NOTE — Progress Notes (Signed)
Cardiology Office Note   Date:  06/16/2022   ID:  Megan Valentine, DOB 1950/05/15, MRN [id number]  PCP:  Megan Sax, MD    No chief complaint on file.  DOE  [employer] Readings from Last 3 Encounters:  06/16/22 181 lb 9.6 oz (82.4 kg)  05/06/22 184 lb (83.5 kg)  05/03/22 183 lb 6.4 oz (83.2 kg)       History of Present Illness: Megan Valentine is a 72 y.o. female who is being seen today for the evaluation of DOE at the request of Megan Valentine,*.   Records from primary care doctor in April 2024 show: " Reports dyspnea with minor exertion.  While she does experience left upper chest pain it is not associated with exertion or her dyspnea.  Typically she is not physically active.  She does have a mildly elevated 10-year risk score but is statin intolerant.  Has been resistant to treatment.  Feels as though her asthma is well-controlled with Advair.  She continues to feel some congestion.  There have been no fevers or chills.  No chronic phlegm production. "  Zetia was added for lipid-lowering therapy. She was also seen by pulmonary with plans for pulmonary function test.  Mild asthma was noted.  She notes the shortness of breath and congestion started in Jan, perhaps due to increased pollen count.  SHe was treated with antibiotics and steroids several times.   She has no energy and "gives out" after 75 feet.  Has some edema.  Diuretic was stopped a few ears ago.  Can have a tight feeling at times as well.  Some improvement with her inhaler.  She feels that she is using the inhaler more more.  No regular exercise.  Works in the yard 1-2 weeks.  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    Quit smoking 40+ years ago. Father with MI and CHF.  MI was at age 23. Half brother with heart problems.   Has a h/o vasovagal syncope.    Past Medical History:  Diagnosis Date   Allergy    Arthritis    Asthma    Cataract    Chronic bronchitis (HCC)    Chronic kidney disease    stage 3, kidney  infections   Complication of anesthesia    hard to wake up   Diverticulosis    Environmental allergies    GERD (gastroesophageal reflux disease)    Hiatal hernia    History of chicken pox    Hyperlipidemia    Hypothyroidism    IBS (irritable bowel syndrome)    Migraines    Mitral valve prolapse    Per pt . per Dr.Henry  [name].  does not have MVP   Neuromuscular disorder (HCC)    neuropathy feet   Pneumonia    Rectal prolapse     Past Surgical History:  Procedure Laterality Date   ABDOMINAL HYSTERECTOMY  1999   APPENDECTOMY  1962   BREAST CYST ASPIRATION Bilateral    CHOLECYSTECTOMY N/A 12/17/2018   Procedure: LAPAROSCOPIC CHOLECYSTECTOMY;  Surgeon: Berna Bue, MD;  Location: [facility] OR;  Service: General;  Laterality: N/A;   COLONOSCOPY     EYE SURGERY     cataract   JOINT REPLACEMENT     rt knee scope   REFRACTIVE SURGERY  2000   both eyes   retrocele repair     and bladder sling   TOTAL KNEE ARTHROPLASTY Right 03/24/2015   Procedure: TOTAL KNEE  ARTHROPLASTY;  Surgeon: Valeria Batman, MD;  Location: [facility] OR;  Service: Orthopedics;  Laterality: Right;   TOTAL KNEE ARTHROPLASTY Left 11/17/2020   Procedure: LEFT TOTAL KNEE ARTHROPLASTY;  Surgeon: Valeria Batman, MD;  Location: [facility] ORS;  Service: Orthopedics;  Laterality: Left;   TUBAL LIGATION  1977   UPPER GASTROINTESTINAL ENDOSCOPY       Current Outpatient Medications  Medication Sig Dispense Refill   acetaminophen (TYLENOL) 325 MG tablet Take 2 tablets (650 mg total) by mouth every 6 (six) hours as needed for moderate pain. 30 tablet 0   albuterol (VENTOLIN HFA) 108 (90 Base) MCG/ACT inhaler USE 1 TO 2 PUFFS EVERY 6 HOURS AS NEEDED FOR WHEEZING OR SHORTNESS OF BREATH 18 g 0   aspirin 81 MG chewable tablet Chew 1 tablet (81 mg total) by mouth 2 (two) times daily.     Budeson-Glycopyrrol-Formoterol (BREZTRI AEROSPHERE) 160-9-4.8 MCG/ACT AERO Inhale 2 puffs into the lungs in the morning and at bedtime. 1 each 0    Budeson-Glycopyrrol-Formoterol (BREZTRI AEROSPHERE) 160-9-4.8 MCG/ACT AERO Inhale 2 puffs into the lungs in the morning and at bedtime. 10.7 g 11   Calcium Carbonate Antacid 1177 MG CHEW Chew 1 tablet by mouth. As needed     Cranberry 12600 MG CAPS Take 1 tablet by mouth. daily     diclofenac Sodium (VOLTAREN ARTHRITIS PAIN) 1 % GEL Apply a small grape sized dollop 1 disorder 2 weeks of pends up to 4 times daily as needed 150 g 1   docusate sodium (COLACE) 250 MG capsule Take 250 mg by mouth daily. Takes 4 daily     esomeprazole (NEXIUM) 40 MG capsule TAKE 1 CAPSULE BY MOUTH ONCE DAILY 30 MINUTES BEFORE BREAKFAST 90 capsule 1   estradiol (ESTRACE) 0.5 MG tablet Take 0.5 mg by mouth daily.     ezetimibe (ZETIA) 10 MG tablet Take 1 tablet (10 mg total) by mouth daily. 90 tablet 3   famotidine (PEPCID) 20 MG tablet Take 20 mg by mouth daily.     fluticasone (FLONASE) 50 MCG/ACT nasal spray Place 1 spray into both nostrils in the morning and at bedtime. 16 g 0   fluticasone-salmeterol (ADVAIR HFA) 230-21 MCG/ACT inhaler Inhale 2 puffs into the lungs 2 (two) times daily. 1 each 12   hydroxypropyl methylcellulose / hypromellose (ISOPTO TEARS / GONIOVISC) 2.5 % ophthalmic solution Place 1 drop into both eyes 4 (four) times daily as needed for dry eyes.     levocetirizine (XYZAL) 5 MG tablet TAKE 1 TABLET BY MOUTH ONCE DAILY IN THE EVENING 30 tablet 0   levothyroxine (SYNTHROID) 112 MCG tablet Take 1 tablet (112 mcg total) by mouth daily before breakfast. 90 tablet 3   Multiple Vitamins-Minerals (HAIR/SKIN/NAILS/BIOTIN PO) Take 3 tablets by mouth in the morning.     Sennosides 17.2 MG TABS Take 1 tablet by mouth. Takes 6 times daily     budesonide-formoterol (SYMBICORT) 160-4.5 MCG/ACT inhaler Inhale 2 puffs into the lungs 2 (two) times daily. (Patient not taking: Reported on 06/16/2022) 1 each 6   famotidine (PEPCID) 20 mg Inject 20 mg into the vein every 12 (twelve) hours.     No current  facility-administered medications for this visit.    Allergies:   Tetracycline, Dilaudid [hydromorphone hcl], and Other    Social History:  The patient  reports that she quit smoking about 46 years ago. Her smoking use included cigarettes. She has never used smokeless tobacco. She reports that she does not drink alcohol  and does not use drugs.   Family History:  The patient's family history includes Alzheimer's disease in her mother; Cancer in her maternal grandmother; Colon cancer in her paternal grandfather; Diabetes in her father, maternal grandmother, and mother; Heart attack in her father and paternal grandfather; Heart disease in her father; Hyperlipidemia in her father and mother; Hypertension in her maternal grandmother and mother; Stomach cancer in her maternal grandmother; Stroke in her maternal grandmother and paternal grandmother.    ROS:  Please see the history of present illness.   Otherwise, review of systems are positive for DOE.   All other systems are reviewed and negative.    PHYSICAL EXAM: VS:  Ht 5\' 4"  (1.626 m)   Wt 181 lb 9.6 oz (82.4 kg)   BMI 31.17 kg/m  , BMI Body mass index is 31.17 kg/m. GEN: Well nourished, well developed, in no acute distress HEENT: normal Neck: no JVD, carotid bruits, or masses Cardiac: RRR; no murmurs, rubs, or gallops,no edema  Respiratory:  clear to auscultation bilaterally, normal work of breathing GI: soft, nontender, nondistended, + BS MS: no deformity or atrophy Skin: warm and dry, no rash Neuro:  Strength and sensation are intact Psych: euthymic mood, full affect   EKG:   The ekg ordered today demonstrates NSR, PRWP, no ST changes   Recent Labs: 01/04/2022: ALT 13 05/03/2022: BUN 15; Creatinine, Ser 0.98; Potassium 4.4; Sodium 142; TSH 4.53 05/06/2022: Hemoglobin 13.5; Platelets 347.0   Lipid Panel    Component Value Date/Time   CHOL 223 (H) 01/04/2022 0959   TRIG 261.0 (H) 01/04/2022 0959   HDL 55.90 01/04/2022 0959    CHOLHDL 4 01/04/2022 0959   VLDL 52.2 (H) 01/04/2022 0959   LDLCALC 155 (H) 09/30/2020 1115   LDLDIRECT 133.0 01/04/2022 0959     Other studies Reviewed: Additional studies/ records that were reviewed today with results demonstrating: LDL 155 before Zetia.   ASSESSMENT AND PLAN:  Dyspnea on exertion: Known history of asthma with increased use of inhaler.  Several risk factors for heart disease.  Coronary CT angiogram to evaluate for obstructive coronary artery disease, as her symptoms may be an anginal equivalent.  Repeat echocardiogram since it has been over 3 years.   Metoprolol 100 mg x 1 prior to CTA.  Hyperlipidemia: Was started on Zetia as she is statin intolerant.  Repeat check in July.   CKD 3A: Stay well-hydrated, especially prior to CTA.  Avoid nephrotoxins.  GFR appears adequate for CTA.  Prediabetes: A1C 5.9 in December 2023.  Recommend whole food plant-based diet.  High-fiber diet.  Avoid processed foods.   Current medicines are reviewed at length with the patient today.  The patient concerns regarding her medicines were addressed.  The following changes have been made:  No change  Labs/ tests ordered today include: as above No orders of the defined types were placed in this encounter.   Recommend 150 minutes/week of aerobic exercise Low fat, low carb, high fiber diet recommended  Disposition:   FU in based on test results   Signed, Lance Muss, MD  06/16/2022 9:52 AM    [facility] [address] [place], [place], [place]  [address] Phone: [phone]; Fax: [phone]

## 2022-06-17 ENCOUNTER — Encounter (HOSPITAL_BASED_OUTPATIENT_CLINIC_OR_DEPARTMENT_OTHER): Payer: PPO

## 2022-06-17 ENCOUNTER — Ambulatory Visit: Payer: PPO | Admitting: Pulmonary Disease

## 2022-06-17 LAB — BASIC METABOLIC PANEL
BUN/Creatinine Ratio: 15 (ref 12–28)
BUN: 15 mg/dL (ref 8–27)
CO2: 22 mmol/L (ref 20–29)
Calcium: 9.1 mg/dL (ref 8.7–10.3)
Chloride: 108 mmol/L — ABNORMAL HIGH (ref 96–106)
Creatinine, Ser: 0.98 mg/dL (ref 0.57–1.00)
Glucose: 95 mg/dL (ref 70–99)
Potassium: 4.2 mmol/L (ref 3.5–5.2)
Sodium: 143 mmol/L (ref 134–144)
eGFR: 61 mL/min/{1.73_m2} (ref >59)

## 2022-06-21 ENCOUNTER — Ambulatory Visit: Payer: PPO | Admitting: Pulmonary Disease

## 2022-06-29 ENCOUNTER — Telehealth: Payer: Self-pay

## 2022-06-29 NOTE — Telephone Encounter (Signed)
PA request for Symbicort received via fax from [facility] on [address]  Chart states patient may be moving forward with Breztri as co-pay is $47.00 and applying for possible assistance.

## 2022-06-30 ENCOUNTER — Telehealth (HOSPITAL_COMMUNITY): Payer: Self-pay | Admitting: *Deleted

## 2022-06-30 NOTE — Telephone Encounter (Signed)
Reaching out to patient to offer assistance regarding upcoming cardiac imaging study; pt verbalizes understanding of appt date/time, parking situation and where to check in, pre-test NPO status and medications ordered, and verified current allergies; name and call back number provided for further questions should they arise  Rolin Schult RN Navigator Cardiac Imaging [facility] [phone] office [phone] cell  Patient to take 100mg metoprolol tartrate two hours prior to her cardiac CT scan. She is aware to arrive at 1pm. 

## 2022-06-30 NOTE — Telephone Encounter (Signed)
Patient will be moving forward with [facility]

## 2022-07-01 ENCOUNTER — Other Ambulatory Visit: Payer: Self-pay | Admitting: Family Medicine

## 2022-07-01 ENCOUNTER — Ambulatory Visit (HOSPITAL_COMMUNITY)
Admission: RE | Admit: 2022-07-01 | Discharge: 2022-07-01 | Disposition: A | Discharge status: Home / Self Care | Payer: PPO | Source: Ambulatory Visit | Attending: Interventional Cardiology | Admitting: Interventional Cardiology

## 2022-07-01 DIAGNOSIS — K7689 Other specified diseases of liver: Secondary | ICD-10-CM | POA: Insufficient documentation

## 2022-07-01 DIAGNOSIS — I209 Angina pectoris, unspecified: Secondary | ICD-10-CM | POA: Diagnosis not present

## 2022-07-01 DIAGNOSIS — E782 Mixed hyperlipidemia: Secondary | ICD-10-CM | POA: Insufficient documentation

## 2022-07-01 DIAGNOSIS — J301 Allergic rhinitis due to pollen: Secondary | ICD-10-CM

## 2022-07-01 DIAGNOSIS — H6991 Unspecified Eustachian tube disorder, right ear: Secondary | ICD-10-CM

## 2022-07-01 DIAGNOSIS — R0609 Other forms of dyspnea: Secondary | ICD-10-CM | POA: Diagnosis not present

## 2022-07-01 DIAGNOSIS — R0989 Other specified symptoms and signs involving the circulatory and respiratory systems: Secondary | ICD-10-CM

## 2022-07-01 MED ORDER — NITROGLYCERIN 0.4 MG SL SUBL
SUBLINGUAL_TABLET | SUBLINGUAL | Status: AC
  Filled 2022-07-01: qty 2

## 2022-07-01 MED ORDER — NITROGLYCERIN 0.4 MG SL SUBL
0.8000 mg | SUBLINGUAL_TABLET | Freq: Once | SUBLINGUAL | Status: AC
  Administered 2022-07-01: 0.8 mg via SUBLINGUAL

## 2022-07-01 MED ORDER — IOHEXOL 350 MG/ML SOLN
95.0000 mL | Freq: Once | INTRAVENOUS | Status: AC | PRN
  Administered 2022-07-01: 95 mL via INTRAVENOUS

## 2022-07-01 NOTE — Progress Notes (Addendum)
Pre procedure vitals HR 67 BP 132/85  IV Started 20g L AC Patient tolerated well  Post procedure vitals: HR 71 BP 108/76  IV removed Catheter intact Bleeding controlled  Pt reports feeling well, denies headache, dizziness, lightheadedness.  Pt arrived to appt with significant other, transported to radiology waiting room to meet them, ambulated without assist.  Pt had no questions about follow up, NAD.

## 2022-07-04 ENCOUNTER — Ambulatory Visit: Payer: PPO | Admitting: Family Medicine

## 2022-07-06 ENCOUNTER — Other Ambulatory Visit: Payer: Self-pay | Admitting: Family Medicine

## 2022-07-06 DIAGNOSIS — J45909 Unspecified asthma, uncomplicated: Secondary | ICD-10-CM

## 2022-07-13 ENCOUNTER — Ambulatory Visit (HOSPITAL_COMMUNITY): Payer: PPO | Attending: Cardiology

## 2022-07-13 DIAGNOSIS — R0609 Other forms of dyspnea: Secondary | ICD-10-CM

## 2022-07-13 DIAGNOSIS — E782 Mixed hyperlipidemia: Secondary | ICD-10-CM | POA: Insufficient documentation

## 2022-07-13 LAB — ECHOCARDIOGRAM COMPLETE
Area-P 1/2: 3.77 cm2
S' Lateral: 2.3 cm

## 2022-07-21 ENCOUNTER — Ambulatory Visit (INDEPENDENT_AMBULATORY_CARE_PROVIDER_SITE_OTHER): Payer: PPO | Admitting: Pulmonary Disease

## 2022-07-21 DIAGNOSIS — J4520 Mild intermittent asthma, uncomplicated: Secondary | ICD-10-CM

## 2022-07-21 LAB — PULMONARY FUNCTION TEST
DL/VA % pred: 93 %
DL/VA: 3.84 ml/min/mmHg/L
DLCO cor % pred: 88 %
DLCO cor: 17.03 ml/min/mmHg
DLCO unc % pred: 88 %
DLCO unc: 17.03 ml/min/mmHg
FEF 25-75 Pre: 2.06 L/sec
FEF2575-%Pred-Pre: 113 %
FEV1-%Pred-Pre: 98 %
FEV1-Pre: 2.17 L
FEV1FVC-%Pred-Pre: 120 %
FEV6-%Pred-Pre: 85 %
FEV6-Pre: 2.38 L
FEV6FVC-%Pred-Pre: 104 %
FVC-%Pred-Pre: 81 %
FVC-Pre: 2.38 L
Pre FEV1/FVC ratio: 91 %
Pre FEV6/FVC Ratio: 100 %
RV % pred: 100 %
RV: 2.25 L
TLC % pred: 98 %
TLC: 4.99 L

## 2022-07-21 NOTE — Progress Notes (Signed)
Full PFT performed today without Post Spiro. 

## 2022-07-21 NOTE — Patient Instructions (Signed)
Full PFT performed today without Post Cleda Daub.

## 2022-07-29 ENCOUNTER — Ambulatory Visit (INDEPENDENT_AMBULATORY_CARE_PROVIDER_SITE_OTHER): Payer: PPO | Admitting: Family Medicine

## 2022-07-29 ENCOUNTER — Other Ambulatory Visit: Payer: Self-pay | Admitting: Family Medicine

## 2022-07-29 ENCOUNTER — Encounter: Payer: Self-pay | Admitting: Family Medicine

## 2022-07-29 VITALS — BP 118/76 | HR 72 | Temp 96.5°F | Ht 64.0 in | Wt 183.6 lb

## 2022-07-29 DIAGNOSIS — N1831 Chronic kidney disease, stage 3a: Secondary | ICD-10-CM | POA: Diagnosis not present

## 2022-07-29 DIAGNOSIS — R7303 Prediabetes: Secondary | ICD-10-CM | POA: Insufficient documentation

## 2022-07-29 DIAGNOSIS — R0989 Other specified symptoms and signs involving the circulatory and respiratory systems: Secondary | ICD-10-CM

## 2022-07-29 DIAGNOSIS — R0609 Other forms of dyspnea: Secondary | ICD-10-CM | POA: Diagnosis not present

## 2022-07-29 DIAGNOSIS — J4530 Mild persistent asthma, uncomplicated: Secondary | ICD-10-CM | POA: Diagnosis not present

## 2022-07-29 DIAGNOSIS — H6991 Unspecified Eustachian tube disorder, right ear: Secondary | ICD-10-CM

## 2022-07-29 DIAGNOSIS — E7800 Pure hypercholesterolemia, unspecified: Secondary | ICD-10-CM | POA: Diagnosis not present

## 2022-07-29 DIAGNOSIS — J301 Allergic rhinitis due to pollen: Secondary | ICD-10-CM

## 2022-07-29 LAB — BASIC METABOLIC PANEL
BUN: 15 mg/dL (ref 6–23)
CO2: 25 mEq/L (ref 19–32)
Calcium: 9.2 mg/dL (ref 8.4–10.5)
Chloride: 106 mEq/L (ref 96–112)
Creatinine, Ser: 0.97 mg/dL (ref 0.40–1.20)
GFR: 58.44 mL/min — ABNORMAL LOW (ref >60.00)
Glucose, Bld: 107 mg/dL — ABNORMAL HIGH (ref 70–99)
Potassium: 4.1 mEq/L (ref 3.5–5.1)
Sodium: 139 mEq/L (ref 135–145)

## 2022-07-29 LAB — LIPID PANEL
Cholesterol: 188 mg/dL (ref 0–200)
HDL: 53.8 mg/dL (ref >39.00)
LDL Cholesterol: 101 mg/dL — ABNORMAL HIGH (ref 0–99)
NonHDL: 133.82
Total CHOL/HDL Ratio: 3
Triglycerides: 164 mg/dL — ABNORMAL HIGH (ref 0.0–149.0)
VLDL: 32.8 mg/dL (ref 0.0–40.0)

## 2022-07-29 LAB — HEMOGLOBIN A1C: Hgb A1c MFr Bld: 5.9 % (ref 4.6–6.5)

## 2022-07-29 NOTE — Progress Notes (Signed)
Established Patient Office Visit   Subjective:  Patient ID: Megan Valentine, female    DOB: Oct 16, 1950  Age: 72 y.o. MRN: [id number]  Chief Complaint  Patient presents with   Medical Management of Chronic Issues    3 months follow up. Pt is fasting.     HPI Encounter Diagnoses  Name Primary?   Prediabetes Yes   Stage 3a chronic kidney disease (HCC)    DOE (dyspnea on exertion)    Mild persistent asthma without complication    Elevated cholesterol    Improving.  Saw pulmonary and was diagnosed with mild asthma.  She is taking a controller.  It is helpful.  She still experiences some DOE.  There is no chest pain.  Cardiac workup was negative.  Coronary artery calcium score was 0.  She does report being exposed to toxic chemicals and smokers at her job with the [employer].  She continues to work a few days a week.   Review of Systems  Constitutional: Negative.   HENT: Negative.    Eyes:  Negative for blurred vision, discharge and redness.  Respiratory: Negative.    Cardiovascular: Negative.   Gastrointestinal:  Negative for abdominal pain.  Genitourinary: Negative.   Musculoskeletal: Negative.  Negative for myalgias.  Skin:  Negative for rash.  Neurological:  Negative for tingling, loss of consciousness and weakness.  Endo/Heme/Allergies:  Negative for polydipsia.     Current Outpatient Medications:    acetaminophen (TYLENOL) 325 MG tablet, Take 2 tablets (650 mg total) by mouth every 6 (six) hours as needed for moderate pain., Disp: 30 tablet, Rfl: 0   albuterol (VENTOLIN HFA) 108 (90 Base) MCG/ACT inhaler, USE 1 TO 2 PUFFS EVERY 6 HOURS AS NEEDED FOR WHEEZING OR SHORTNESS OF BREATH, Disp: 18 g, Rfl: 0   aspirin 81 MG chewable tablet, Chew 1 tablet (81 mg total) by mouth 2 (two) times daily., Disp: , Rfl:    Budeson-Glycopyrrol-Formoterol (BREZTRI AEROSPHERE) 160-9-4.8 MCG/ACT AERO, Inhale 2 puffs into the lungs in the morning and at bedtime., Disp: 10.7 g, Rfl: 11   Calcium  Carbonate Antacid 1177 MG CHEW, Chew 1 tablet by mouth. As needed, Disp: , Rfl:    Cranberry 12600 MG CAPS, Take 1 tablet by mouth. daily, Disp: , Rfl:    diclofenac Sodium (VOLTAREN ARTHRITIS PAIN) 1 % GEL, Apply a small grape sized dollop 1 disorder 2 weeks of pends up to 4 times daily as needed, Disp: 150 g, Rfl: 1   docusate sodium (COLACE) 250 MG capsule, Take 250 mg by mouth daily. Takes 4 daily, Disp: , Rfl:    esomeprazole (NEXIUM) 40 MG capsule, TAKE 1 CAPSULE BY MOUTH ONCE DAILY 30 MINUTES BEFORE BREAKFAST, Disp: 90 capsule, Rfl: 1   estradiol (ESTRACE) 0.5 MG tablet, Take 0.5 mg by mouth daily., Disp: , Rfl:    ezetimibe (ZETIA) 10 MG tablet, Take 1 tablet (10 mg total) by mouth daily., Disp: 90 tablet, Rfl: 3   famotidine (PEPCID) 20 MG tablet, Take 20 mg by mouth daily., Disp: , Rfl:    fluticasone (FLONASE) 50 MCG/ACT nasal spray, Place 1 spray into both nostrils in the morning and at bedtime., Disp: 16 g, Rfl: 0   hydroxypropyl methylcellulose / hypromellose (ISOPTO TEARS / GONIOVISC) 2.5 % ophthalmic solution, Place 1 drop into both eyes 4 (four) times daily as needed for dry eyes., Disp: , Rfl:    levocetirizine (XYZAL) 5 MG tablet, TAKE 1 TABLET BY MOUTH ONCE DAILY IN THE [place],  Disp: 30 tablet, Rfl: 3   levothyroxine (SYNTHROID) 112 MCG tablet, Take 1 tablet (112 mcg total) by mouth daily before breakfast., Disp: 90 tablet, Rfl: 3   Multiple Vitamins-Minerals (HAIR/SKIN/NAILS/BIOTIN PO), Take 3 tablets by mouth in the morning., Disp: , Rfl:    Sennosides 17.2 MG TABS, Take 1 tablet by mouth. Takes 6 times daily, Disp: , Rfl:    Budeson-Glycopyrrol-Formoterol (BREZTRI AEROSPHERE) 160-9-4.8 MCG/ACT AERO, Inhale 2 puffs into the lungs in the morning and at bedtime., Disp: 1 each, Rfl: 0   budesonide-formoterol (SYMBICORT) 160-4.5 MCG/ACT inhaler, Inhale 2 puffs into the lungs 2 (two) times daily., Disp: 1 each, Rfl: 6   famotidine (PEPCID) 20 mg, Inject 20 mg into the vein every 12  (twelve) hours., Disp: , Rfl:    metoprolol tartrate (LOPRESSOR) 100 MG tablet, Take one tablet by mouth 2 hours prior to CT Scan, Disp: 1 tablet, Rfl: 0   Objective:     BP 118/76   Pulse 72   Temp (!) 96.5 F (35.8 C)   Ht 5\' 4"  (1.626 m)   Wt 183 lb 9.6 oz (83.3 kg)   SpO2 97%   BMI 31.51 kg/m    Physical Exam Constitutional:      General: She is not in acute distress.    Appearance: Normal appearance. She is not ill-appearing, toxic-appearing or diaphoretic.  HENT:     Head: Normocephalic and atraumatic.     Right Ear: External ear normal.     Left Ear: External ear normal.     Mouth/Throat:     Mouth: Mucous membranes are moist.     Pharynx: Oropharynx is clear. No oropharyngeal exudate or posterior oropharyngeal erythema.  Eyes:     General: No scleral icterus.       Right eye: No discharge.        Left eye: No discharge.     Extraocular Movements: Extraocular movements intact.     Conjunctiva/sclera: Conjunctivae normal.     Pupils: Pupils are equal, round, and reactive to light.  Cardiovascular:     Rate and Rhythm: Normal rate and regular rhythm.  Pulmonary:     Effort: Pulmonary effort is normal. No respiratory distress.     Breath sounds: Normal breath sounds.  Musculoskeletal:     Cervical back: No rigidity or tenderness.  Skin:    General: Skin is warm and dry.  Neurological:     Mental Status: She is alert and oriented to person, place, and time.  Psychiatric:        Mood and Affect: Mood normal.        Behavior: Behavior normal.      No results found for any visits on 07/29/22.    The 10-year ASCVD risk score (Arnett DK, et al., 2019) is: 13.6%    Assessment & Plan:   Prediabetes -     Basic metabolic panel -     Hemoglobin A1c  Stage 3a chronic kidney disease (HCC) -     Basic metabolic panel  DOE (dyspnea on exertion)  Mild persistent asthma without complication  Elevated cholesterol -     Lipid panel    Return in about 3  months (around 10/29/2022).  We discussed treating prediabetes.  Encouraged to exercise by walking.  Continue adequate hydration.  Continue Zetia.  Continue follow-up with pulmonology. Mliss Sax, MD

## 2022-07-31 ENCOUNTER — Other Ambulatory Visit: Payer: Self-pay

## 2022-07-31 ENCOUNTER — Encounter (HOSPITAL_COMMUNITY): Payer: Self-pay | Admitting: *Deleted

## 2022-07-31 ENCOUNTER — Ambulatory Visit (HOSPITAL_COMMUNITY)
Admission: EM | Admit: 2022-07-31 | Discharge: 2022-07-31 | Disposition: A | Discharge status: Home / Self Care | Payer: PPO | Attending: Internal Medicine | Admitting: Internal Medicine

## 2022-07-31 DIAGNOSIS — R350 Frequency of micturition: Secondary | ICD-10-CM | POA: Insufficient documentation

## 2022-07-31 LAB — POCT URINALYSIS DIP (MANUAL ENTRY)
Bilirubin, UA: NEGATIVE
Glucose, UA: NEGATIVE mg/dL
Ketones, POC UA: NEGATIVE mg/dL
Nitrite, UA: NEGATIVE
Protein Ur, POC: NEGATIVE mg/dL
Spec Grav, UA: 1.01 (ref 1.010–1.025)
Urobilinogen, UA: 0.2 E.U./dL
pH, UA: 5.5 (ref 5.0–8.0)

## 2022-07-31 MED ORDER — CEPHALEXIN 500 MG PO CAPS: 500.0000 mg | ORAL_CAPSULE | Freq: Two times a day (BID) | ORAL | 0 refills | Status: AC

## 2022-07-31 NOTE — Discharge Instructions (Addendum)
Your urinalysis today shows Yenty Bloch blood cells which fight infection but does not currently show bacteria therefore is it and sent to the lab to see if bacteria will grow, if this occurs you will be notified if any changes need to be made to your medicine treatment will be started prophylactically if you are symptomatic  Begin use of cephalexin every morning and every evening for 5 days  You may use over-the-counter Azo to help minimize your symptoms until antibiotic removes bacteria, this medication will turn your urine orange  Increase your fluid intake through use of water  As always practice good hygiene, wiping front to back and avoidance of scented vaginal products to prevent further irritation  If symptoms continue to persist after use of medication or recur please follow-up with urgent care or your primary doctor as needed

## 2022-07-31 NOTE — ED Triage Notes (Signed)
Pt reports since yesterday she has had urinary frequency , abd and back pain.

## 2022-07-31 NOTE — ED Provider Notes (Signed)
[facility]    CSN: [id number] Arrival date & time: 07/31/22  1722      History   Chief Complaint Chief Complaint  Patient presents with   Urinary Tract Infection   Urinary Frequency   Back Pain    HPI Megan Valentine is a 72 y.o. female.   Patient presents for evaluation of general malaise beginning 1 day ago, begins to experience urinary frequency, dysuria and lower abdominal pressure with urination today.  Takes daily cranberry tablets but no additional treatment.  Denies flank pain, fevers or vaginal symptoms.    Past Medical History:  Diagnosis Date   Allergy    Arthritis    Asthma    Cataract    Chronic bronchitis (HCC)    Chronic kidney disease    stage 3, kidney infections   Complication of anesthesia    hard to wake up   Diverticulosis    Environmental allergies    GERD (gastroesophageal reflux disease)    Hiatal hernia    History of chicken pox    Hyperlipidemia    Hypothyroidism    IBS (irritable bowel syndrome)    Migraines    Mitral valve prolapse    Per pt . per Dr.Henry  [name].  does not have MVP   Neuromuscular disorder (HCC)    neuropathy feet   Pneumonia    Rectal prolapse     Patient Active Problem List   Diagnosis Date Noted   Elevated cholesterol 07/29/2022   Prediabetes 07/29/2022   DOE (dyspnea on exertion) 06/10/2022   Left hip pain 02/21/2022   Myalgia due to statin 01/04/2022   Arthritis of carpometacarpal (CMC) joint of both thumbs 09/07/2021   Carpal tunnel syndrome of right wrist 09/06/2021   Primary osteoarthritis of right hand 09/06/2021   Neuropathy 05/13/2021   Venous insufficiency 05/13/2021   Low back pain 04/05/2021   Acquired leg length discrepancy 04/05/2021   Urinary urgency 01/14/2021   Acute cystitis without hematuria 01/14/2021   Macrocytosis 12/07/2020   S/P TKR (total knee replacement) using cement, left 11/17/2020   Chronic pain of right knee 09/30/2020   Medication side effect 09/30/2020    Statin intolerance 06/30/2020   Trigger point of shoulder region, left 02/25/2020   B12 deficiency 08/15/2019   Elevated glucose 08/15/2019   Anemia 02/15/2019   Abdominal pain 12/14/2018   Sinus pressure 11/12/2018   History of 2019 novel coronavirus disease (COVID-19) 11/12/2018   Pleurisy 06/05/2018   Enterocele 06/05/2018   Rectocele 05/23/2018   Vaginal vault prolapse 05/23/2018   SUI (stress urinary incontinence, female) 05/23/2018   Asthma 05/07/2018   Stage 3a chronic kidney disease (HCC) 01/15/2018   Edema 12/15/2017   Insomnia 11/20/2017   Seborrheic dermatitis 11/16/2017   Seasonal allergic rhinitis due to pollen 11/16/2017   Menopausal and female climacteric states 10/19/2017   Constipation 03/26/2015   Unilateral primary osteoarthritis, left knee 11/17/2014   Healthcare maintenance 04/25/2014   Hypokalemia 09/15/2011   Hypothyroidism 08/21/2008   Mixed hyperlipidemia 08/21/2008    Past Surgical History:  Procedure Laterality Date   ABDOMINAL HYSTERECTOMY  1999   APPENDECTOMY  1962   BREAST CYST ASPIRATION Bilateral    CHOLECYSTECTOMY N/A 12/17/2018   Procedure: LAPAROSCOPIC CHOLECYSTECTOMY;  Surgeon: Berna Bue, MD;  Location: [facility] OR;  Service: General;  Laterality: N/A;   COLONOSCOPY     EYE SURGERY     cataract   JOINT REPLACEMENT     rt knee scope  REFRACTIVE SURGERY  2000   both eyes   retrocele repair     and bladder sling   TOTAL KNEE ARTHROPLASTY Right 03/24/2015   Procedure: TOTAL KNEE ARTHROPLASTY;  Surgeon: Valeria Batman, MD;  Location: [facility] OR;  Service: Orthopedics;  Laterality: Right;   TOTAL KNEE ARTHROPLASTY Left 11/17/2020   Procedure: LEFT TOTAL KNEE ARTHROPLASTY;  Surgeon: Valeria Batman, MD;  Location: [facility];  Service: Orthopedics;  Laterality: Left;   TUBAL LIGATION  1977   UPPER GASTROINTESTINAL ENDOSCOPY      OB History   No obstetric history on file.      Home Medications    Prior to Admission medications    Medication Sig Start Date End Date Taking? Authorizing Provider  acetaminophen (TYLENOL) 325 MG tablet Take 2 tablets (650 mg total) by mouth every 6 (six) hours as needed for moderate pain. 03/03/21  Yes Wallis Bamberg, PA-C  albuterol (VENTOLIN HFA) 108 (90 Base) MCG/ACT inhaler USE 1 TO 2 PUFFS EVERY 6 HOURS AS NEEDED FOR WHEEZING OR SHORTNESS OF BREATH 06/16/22  Yes Mliss Sax, MD  aspirin 81 MG chewable tablet Chew 1 tablet (81 mg total) by mouth 2 (two) times daily. 11/19/20  Yes Petrarca, Oris Drone, PA-C  Budeson-Glycopyrrol-Formoterol (BREZTRI AEROSPHERE) 160-9-4.8 MCG/ACT AERO Inhale 2 puffs into the lungs in the morning and at bedtime. 05/06/22  Yes Mannam, Praveen, MD  Budeson-Glycopyrrol-Formoterol (BREZTRI AEROSPHERE) 160-9-4.8 MCG/ACT AERO Inhale 2 puffs into the lungs in the morning and at bedtime. 06/06/22  Yes Mannam, Praveen, MD  Cranberry 12600 MG CAPS Take 1 tablet by mouth. daily   Yes [provider]  docusate sodium (COLACE) 250 MG capsule Take 250 mg by mouth daily. Takes 4 daily   Yes [provider]  esomeprazole (NEXIUM) 40 MG capsule TAKE 1 CAPSULE BY MOUTH ONCE DAILY 30 MINUTES BEFORE BREAKFAST 04/27/22  Yes Arnaldo Natal, NP  estradiol (ESTRACE) 0.5 MG tablet Take 0.5 mg by mouth daily.   Yes [provider]  ezetimibe (ZETIA) 10 MG tablet Take 1 tablet (10 mg total) by mouth daily. 05/03/22  Yes Mliss Sax, MD  famotidine (PEPCID) 20 MG tablet Take 20 mg by mouth daily.   Yes [provider]  hydroxypropyl methylcellulose / hypromellose (ISOPTO TEARS / GONIOVISC) 2.5 % ophthalmic solution Place 1 drop into both eyes 4 (four) times daily as needed for dry eyes.   Yes [provider]  levocetirizine (XYZAL) 5 MG tablet TAKE 1 TABLET BY MOUTH ONCE DAILY IN THE EVENING 07/29/22  Yes Mliss Sax, MD  levothyroxine (SYNTHROID) 112 MCG tablet Take 1 tablet (112 mcg total) by mouth daily before  breakfast. 01/05/22  Yes Mliss Sax, MD  Multiple Vitamins-Minerals (HAIR/SKIN/NAILS/BIOTIN PO) Take 3 tablets by mouth in the morning.   Yes [provider]  Sennosides 17.2 MG TABS Take 1 tablet by mouth. Takes 6 times daily   Yes [provider]  Calcium Carbonate Antacid 1177 MG CHEW Chew 1 tablet by mouth. As needed    [provider]  diclofenac Sodium (VOLTAREN ARTHRITIS PAIN) 1 % GEL Apply a small grape sized dollop 1 disorder 2 weeks of pends up to 4 times daily as needed 09/06/21   Mliss Sax, MD  fluticasone [facility]) 50 MCG/ACT nasal spray Place 1 spray into both nostrils in the morning and at bedtime. 02/17/22   Maretta Bees, PA    Family History Family History  Problem Relation Age of  Onset   Stomach cancer Maternal Grandmother    Hypertension Maternal Grandmother    Diabetes Maternal Grandmother    Stroke Maternal Grandmother    Cancer Maternal Grandmother        Stomach cancer   Colon cancer Paternal Grandfather    Heart attack Paternal Grandfather    Hyperlipidemia Mother    Diabetes Mother    Hypertension Mother    Alzheimer's disease Mother    Diabetes Father    Hyperlipidemia Father    Heart disease Father    Heart attack Father    Stroke Paternal Grandmother     Social History Social History   Tobacco Use   Smoking status: Former    Types: Cigarettes    Quit date: 03/23/1976    Years since quitting: 46.3   Smokeless tobacco: Never  Vaping Use   Vaping Use: Never used  Substance Use Topics   Alcohol use: No   Drug use: No     Allergies   Tetracycline, Dilaudid [hydromorphone hcl], and Other   Review of Systems Review of Systems  Constitutional: Negative.   HENT: Negative.    Respiratory: Negative.    Cardiovascular: Negative.   Genitourinary:  Positive for dysuria, frequency and pelvic pain. Negative for decreased urine volume, difficulty urinating, dyspareunia, enuresis, flank pain,  genital sores, hematuria, menstrual problem, urgency, vaginal bleeding, vaginal discharge and vaginal pain.     Physical Exam Triage Vital Signs ED Triage Vitals  Enc Vitals Group     BP 07/31/22 1756 130/85     Pulse Rate 07/31/22 1756 87     Resp 07/31/22 1756 18     Temp 07/31/22 1756 97.9 F (36.6 C)     Temp src --      SpO2 07/31/22 1756 93 %     Weight --      Height --      Head Circumference --      Peak Flow --      Pain Score 07/31/22 1752 5     Pain Loc --      Pain Edu? --      Excl. in GC? --    No data found.  Updated Vital Signs BP 130/85   Pulse 87   Temp 97.9 F (36.6 C)   Resp 18   SpO2 93%   Visual Acuity Right Eye Distance:   Left Eye Distance:   Bilateral Distance:    Right Eye Near:   Left Eye Near:    Bilateral Near:     Physical Exam Constitutional:      Appearance: Normal appearance.  Eyes:     Extraocular Movements: Extraocular movements intact.  Pulmonary:     Effort: Pulmonary effort is normal.  Abdominal:     General: Abdomen is flat. Bowel sounds are normal. There is no distension.     Palpations: Abdomen is soft.     Tenderness: There is no abdominal tenderness. There is no right CVA tenderness, left CVA tenderness or guarding.  Genitourinary:    Comments: deferred Skin:    General: Skin is warm and dry.  Neurological:     Mental Status: She is alert and oriented to person, place, and time. Mental status is at baseline.      UC Treatments / Results  Labs (all labs ordered are listed, but only abnormal results are displayed) Labs Reviewed  POCT URINALYSIS DIP (MANUAL ENTRY) - Abnormal; Notable for the following components:  Result Value   Blood, UA small (*)    Leukocytes, UA Large (3+) (*)    All other components within normal limits  URINE CULTURE    EKG   Radiology No results found.  Procedures Procedures (including critical care time)  Medications Ordered in UC Medications - No data to  display  Initial Impression / Assessment and Plan / UC Course  I have reviewed the triage vital signs and the nursing notes.  Pertinent labs & imaging results that were available during my care of the patient were reviewed by me and considered in my medical decision making (see chart for details).  Urinary frequency  Urinalysis showing leukocytes, negative for nitrates, sent for culture, prophylactically treating as she is symptomatic, cephalexin sent to pharmacy, discussed additional supportive care, advised follow-up if symptoms persist worsen or recur Final Clinical Impressions(s) / UC Diagnoses   Final diagnoses:  Urinary frequency     Discharge Instructions      Your urinalysis today shows Alyria Krack blood cells which fight infection but does not currently show bacteria therefore is it and sent to the lab to see if bacteria will grow, if this occurs you will be notified if any changes need to be made to your medicine treatment will be started prophylactically if you are symptomatic  Begin use of cephalexin every morning and every evening for 5 days  You may use over-the-counter Azo to help minimize your symptoms until antibiotic removes bacteria, this medication will turn your urine orange  Increase your fluid intake through use of water  As always practice good hygiene, wiping front to back and avoidance of scented vaginal products to prevent further irritation  If symptoms continue to persist after use of medication or recur please follow-up with urgent care or your primary doctor as needed    ED Prescriptions   None    PDMP not reviewed this encounter.   Valinda Hoar, [place] 08/03/22 [phone]

## 2022-08-02 ENCOUNTER — Ambulatory Visit: Payer: PPO | Admitting: Family Medicine

## 2022-08-02 LAB — URINE CULTURE: Culture: >=100000 — AB

## 2022-08-03 ENCOUNTER — Encounter: Payer: Self-pay | Admitting: Pulmonary Disease

## 2022-08-03 ENCOUNTER — Ambulatory Visit: Payer: PPO | Admitting: Pulmonary Disease

## 2022-08-03 VITALS — BP 114/70 | HR 88 | Temp 98.1°F | Ht 64.0 in | Wt 183.6 lb

## 2022-08-03 DIAGNOSIS — J4520 Mild intermittent asthma, uncomplicated: Secondary | ICD-10-CM | POA: Diagnosis not present

## 2022-08-03 DIAGNOSIS — J45909 Unspecified asthma, uncomplicated: Secondary | ICD-10-CM

## 2022-08-03 MED ORDER — IPRATROPIUM-ALBUTEROL 0.5-2.5 (3) MG/3ML IN SOLN: 3.0000 mL | Freq: Four times a day (QID) | RESPIRATORY_TRACT | 11 refills | Status: AC | PRN

## 2022-08-03 MED ORDER — ALBUTEROL SULFATE HFA 108 (90 BASE) MCG/ACT IN AERS: INHALATION_SPRAY | RESPIRATORY_TRACT | 5 refills | Status: DC

## 2022-08-03 NOTE — Patient Instructions (Signed)
I am glad your breathing is stable Continue Breztri Your lung function test and labs are normal Will order DuoNebs as needed Follow-up in 6 months

## 2022-08-03 NOTE — Telephone Encounter (Signed)
Pt calling in b/c she filled out the breztri assistance form and still has heard nothing back since may. Can we please reach out to pt to see what is going! Fax: [phone]

## 2022-08-03 NOTE — Progress Notes (Signed)
Megan Valentine    [id number]    1950/06/08  Primary Care Physician:Kremer, Talmadge Coventry, MD  Referring Physician: Mliss Sax, MD [address] [place],  [place] [address]  Chief complaint: Follow-up for asthma  HPI: 72 y.o. who  has a past medical history of Allergy, Arthritis, Asthma, Cataract, Chronic bronchitis (HCC), Chronic kidney disease, Complication of anesthesia, Diverticulosis, Environmental allergies, GERD (gastroesophageal reflux disease), Hiatal hernia, History of chicken pox, Hyperlipidemia, Hypothyroidism, IBS (irritable bowel syndrome), Migraines, Mitral valve prolapse, Neuromuscular disorder (HCC), Pneumonia, and Rectal prolapse.   Referred here for evaluation of shortness of breath, coughing for the past 4 months.  Symptoms are associated with occasional wheezing, chest congestion She has history of seasonal allergies, chronic kidney disease  Previously been on Flovent but it is not covered by her insurance.  Advair was prescribed by her PCP but was too expensive for her.  Pets: Has a dog Occupation: Works as a proofread out Exposures: No exposures.  Denies any exposure to mold, hot tub, Jacuzzi or feather pillows or comforters Smoking history: 13-pack-year smoker.  Quit in 1979 Travel history: No significant travel history Relevant family history: Dad had asthma  Interim history: Here for review of PFTs and labs.  Feels that Markus Daft is working better for her than Flovent.   Outpatient Encounter Medications as of 08/03/2022  Medication Sig   acetaminophen (TYLENOL) 325 MG tablet Take 2 tablets (650 mg total) by mouth every 6 (six) hours as needed for moderate pain.   albuterol (VENTOLIN HFA) 108 (90 Base) MCG/ACT inhaler USE 1 TO 2 PUFFS EVERY 6 HOURS AS NEEDED FOR WHEEZING OR SHORTNESS OF BREATH   aspirin 81 MG chewable tablet Chew 1 tablet (81 mg total) by mouth 2 (two) times daily.   Budeson-Glycopyrrol-Formoterol (BREZTRI  AEROSPHERE) 160-9-4.8 MCG/ACT AERO Inhale 2 puffs into the lungs in the morning and at bedtime.   Calcium Carbonate Antacid 1177 MG CHEW Chew 1 tablet by mouth. As needed   cephALEXin (KEFLEX) 500 MG capsule Take 1 capsule (500 mg total) by mouth 2 (two) times daily for 5 days.   Cranberry 12600 MG CAPS Take 1 tablet by mouth. daily   diclofenac Sodium (VOLTAREN ARTHRITIS PAIN) 1 % GEL Apply a small grape sized dollop 1 disorder 2 weeks of pends up to 4 times daily as needed   docusate sodium (COLACE) 250 MG capsule Take 250 mg by mouth daily. Takes 4 daily   esomeprazole (NEXIUM) 40 MG capsule TAKE 1 CAPSULE BY MOUTH ONCE DAILY 30 MINUTES BEFORE BREAKFAST   estradiol (ESTRACE) 0.5 MG tablet Take 0.5 mg by mouth daily.   ezetimibe (ZETIA) 10 MG tablet Take 1 tablet (10 mg total) by mouth daily.   famotidine (PEPCID) 20 MG tablet Take 20 mg by mouth daily.   fluticasone (FLONASE) 50 MCG/ACT nasal spray Place 1 spray into both nostrils in the morning and at bedtime.   hydroxypropyl methylcellulose / hypromellose (ISOPTO TEARS / GONIOVISC) 2.5 % ophthalmic solution Place 1 drop into both eyes 4 (four) times daily as needed for dry eyes.   levocetirizine (XYZAL) 5 MG tablet TAKE 1 TABLET BY MOUTH ONCE DAILY IN THE EVENING   levothyroxine (SYNTHROID) 112 MCG tablet Take 1 tablet (112 mcg total) by mouth daily before breakfast.   Sennosides 17.2 MG TABS Take 1 tablet by mouth. Takes 6 times daily   [DISCONTINUED] Budeson-Glycopyrrol-Formoterol (BREZTRI AEROSPHERE) 160-9-4.8 MCG/ACT AERO Inhale 2 puffs into the lungs  in the morning and at bedtime.   [DISCONTINUED] Multiple Vitamins-Minerals (HAIR/SKIN/NAILS/BIOTIN PO) Take 3 tablets by mouth in the morning.   No facility-administered encounter medications on file as of 08/03/2022.   Physical Exam: Blood pressure 114/70, pulse 88, temperature 98.1 F (36.7 C), temperature source Oral, height 5\' 4"  (1.626 m), weight 183 lb 9.6 oz (83.3 kg), SpO2 96  %. Gen:      No acute distress HEENT:  EOMI, sclera anicteric Neck:     No masses; no thyromegaly Lungs:    Clear to auscultation bilaterally; normal respiratory effort CV:         Regular rate and rhythm; no murmurs Abd:      + bowel sounds; soft, non-tender; no palpable masses, no distension Ext:    No edema; adequate peripheral perfusion Skin:      Warm and dry; no rash Neuro: alert and oriented x 3 Psych: normal mood and affect   Data Reviewed: Imaging: Chest x-ray 03/11/2022-no acute cardiopulmonary abnormality I have reviewed the images personally.  PFTs: 07/21/2022 FVC 2.38 [81%], FEV1 2.17 [98%], F/F91, TLC 4.99 [98%], DLCO 17.03 [88%] Normal test  FENO 05/06/2022- 34  Labs: CBC 05/06/2022-WBC 8.3, eos 3.2%, absolute eosinophil count 262 IgE 05/06/2022-43  Assessment:  Asthma Continue Breztri as it is helping Order DuoNebs as needed Follow-up in 6 months  Plan/Recommendations: Markus Daft DuoNebs 68-month follow-up  Chilton Greathouse MD [facility] 08/03/2022, 10:53 AM  CC: Mliss Sax,*

## 2022-08-16 ENCOUNTER — Encounter: Payer: Self-pay | Admitting: Pulmonary Disease

## 2022-08-23 ENCOUNTER — Encounter: Payer: Self-pay | Admitting: Pulmonary Disease

## 2022-09-01 NOTE — Telephone Encounter (Signed)
[employer] provider form has been completed and faxed to [phone]. Fax confirmation was received and placed in Dr. Shirlee More A pod box patient assistance folder with completed form.

## 2022-09-01 NOTE — Telephone Encounter (Signed)
Per telephone message from 08/23/2022.   Jacquiline Doe, LPN   07/02/60  9:52 AM Note [employer] provider form has been completed and faxed to [phone]. Fax confirmation was received and placed in Dr. Shirlee More A pod box patient assistance folder with completed form.      Nothing further needed.

## 2022-09-07 ENCOUNTER — Encounter: Payer: Self-pay | Admitting: Family Medicine

## 2022-09-13 ENCOUNTER — Telehealth: Payer: Self-pay | Admitting: Pulmonary Disease

## 2022-09-13 NOTE — Telephone Encounter (Signed)
Patient dropping off patient assistance forms. Put in Dr. Isaiah Serge box. Patient phone number is [phone].

## 2022-09-14 NOTE — Telephone Encounter (Signed)
Responded to patient through my chart will fax over completed forms to [employer] today. Closing encounter. NFN

## 2022-09-20 MED ORDER — BREZTRI AEROSPHERE 160-9-4.8 MCG/ACT IN AERO: 2.0000 | INHALATION_SPRAY | Freq: Two times a day (BID) | RESPIRATORY_TRACT | 11 refills | Status: DC

## 2022-09-23 NOTE — Telephone Encounter (Signed)
A new rx will need to be sent to [employer]. There is nothing for the PA team to do.

## 2022-09-27 ENCOUNTER — Telehealth: Payer: Self-pay | Admitting: Pulmonary Disease

## 2022-09-27 NOTE — Telephone Encounter (Signed)
Patient needs Breztri medication prescription to be sent to [employer]. IT is free if she get it through them. It is approved until the end of December.

## 2022-09-28 DIAGNOSIS — Z961 Presence of intraocular lens: Secondary | ICD-10-CM | POA: Diagnosis not present

## 2022-09-28 DIAGNOSIS — H16223 Keratoconjunctivitis sicca, not specified as Sjogren's, bilateral: Secondary | ICD-10-CM | POA: Diagnosis not present

## 2022-09-28 DIAGNOSIS — H40022 Open angle with borderline findings, high risk, left eye: Secondary | ICD-10-CM | POA: Diagnosis not present

## 2022-09-28 DIAGNOSIS — H40011 Open angle with borderline findings, low risk, right eye: Secondary | ICD-10-CM | POA: Diagnosis not present

## 2022-09-28 NOTE — Telephone Encounter (Signed)
Ok to send Nesbitt prescription to [employer]

## 2022-09-30 ENCOUNTER — Other Ambulatory Visit: Payer: Self-pay | Admitting: Family Medicine

## 2022-09-30 DIAGNOSIS — Z1231 Encounter for screening mammogram for malignant neoplasm of breast: Secondary | ICD-10-CM

## 2022-09-30 MED ORDER — BREZTRI AEROSPHERE 160-9-4.8 MCG/ACT IN AERO: 2.0000 | INHALATION_SPRAY | Freq: Two times a day (BID) | RESPIRATORY_TRACT | 11 refills | Status: DC

## 2022-09-30 NOTE — Addendum Note (Signed)
Addended by: Judd Gaudier on: 09/30/2022 03:46 PM   Modules accepted: Orders

## 2022-09-30 NOTE — Telephone Encounter (Signed)
Breztri sent.

## 2022-10-05 DIAGNOSIS — H40022 Open angle with borderline findings, high risk, left eye: Secondary | ICD-10-CM | POA: Diagnosis not present

## 2022-10-19 ENCOUNTER — Ambulatory Visit: Payer: PPO | Admitting: Nurse Practitioner

## 2022-10-19 ENCOUNTER — Other Ambulatory Visit: Payer: PPO

## 2022-10-19 ENCOUNTER — Encounter: Payer: Self-pay | Admitting: Nurse Practitioner

## 2022-10-19 VITALS — BP 118/64 | HR 65 | Ht 64.25 in | Wt 179.0 lb

## 2022-10-19 DIAGNOSIS — K219 Gastro-esophageal reflux disease without esophagitis: Secondary | ICD-10-CM

## 2022-10-19 DIAGNOSIS — R1011 Right upper quadrant pain: Secondary | ICD-10-CM

## 2022-10-19 DIAGNOSIS — K5900 Constipation, unspecified: Secondary | ICD-10-CM

## 2022-10-19 LAB — COMPREHENSIVE METABOLIC PANEL
ALT: 18 U/L (ref 0–35)
AST: 18 U/L (ref 0–37)
Albumin: 4 g/dL (ref 3.5–5.2)
Alkaline Phosphatase: 85 U/L (ref 39–117)
BUN: 12 mg/dL (ref 6–23)
CO2: 27 mEq/L (ref 19–32)
Calcium: 9 mg/dL (ref 8.4–10.5)
Chloride: 108 mEq/L (ref 96–112)
Creatinine, Ser: 0.97 mg/dL (ref 0.40–1.20)
GFR: 58.35 mL/min — ABNORMAL LOW (ref >60.00)
Glucose, Bld: 129 mg/dL — ABNORMAL HIGH (ref 70–99)
Potassium: 3.7 mEq/L (ref 3.5–5.1)
Sodium: 143 mEq/L (ref 135–145)
Total Bilirubin: 0.3 mg/dL (ref 0.2–1.2)
Total Protein: 6.8 g/dL (ref 6.0–8.3)

## 2022-10-19 LAB — CBC WITH DIFFERENTIAL/PLATELET
Basophils Absolute: 0.1 10*3/uL (ref 0.0–0.1)
Basophils Relative: 1.1 % (ref 0.0–3.0)
Eosinophils Absolute: 0.3 10*3/uL (ref 0.0–0.7)
Eosinophils Relative: 3.8 % (ref 0.0–5.0)
HCT: 40.2 % (ref 36.0–46.0)
Hemoglobin: 13.2 g/dL (ref 12.0–15.0)
Lymphocytes Relative: 24.3 % (ref 12.0–46.0)
Lymphs Abs: 1.8 10*3/uL (ref 0.7–4.0)
MCHC: 32.9 g/dL (ref 30.0–36.0)
MCV: 93.5 fl (ref 78.0–100.0)
Monocytes Absolute: 0.8 10*3/uL (ref 0.1–1.0)
Monocytes Relative: 10.4 % (ref 3.0–12.0)
Neutro Abs: 4.5 10*3/uL (ref 1.4–7.7)
Neutrophils Relative %: 60.4 % (ref 43.0–77.0)
Platelets: 345 10*3/uL (ref 150.0–400.0)
RBC: 4.29 Mil/uL (ref 3.87–5.11)
RDW: 13.9 % (ref 11.5–15.5)
WBC: 7.5 10*3/uL (ref 4.0–10.5)

## 2022-10-19 MED ORDER — LINACLOTIDE 145 MCG PO CAPS: 145.0000 ug | ORAL_CAPSULE | Freq: Every day | ORAL | 1 refills | Status: DC

## 2022-10-19 MED ORDER — ESOMEPRAZOLE MAGNESIUM 40 MG PO CPDR: 40.0000 mg | DELAYED_RELEASE_CAPSULE | Freq: Two times a day (BID) | ORAL | 1 refills | Status: DC

## 2022-10-19 NOTE — Patient Instructions (Signed)
Do not take Senna the night before starting Linzess.  You have been scheduled for an abdominal ultrasound at [facility] (1st floor of hospital) on 10/25/22 at 8:30. Please arrive 15 minutes prior to your appointment for registration. Make certain not to have anything to eat or drink 6 hours prior to your appointment. Should you need to reschedule your appointment, please contact radiology at [phone]. This test typically takes about 30 minutes to perform.  Your provider has requested that you go to the basement level for lab work before leaving today. Press "B" on the elevator. The lab is located at the first door on the left as you exit the elevator.  Continue Famotidine 20 mg every night (over the counter)  We have sent the following medications to your pharmacy for you to pick up at your convenience: Linzess 145 mcg & Esomeprazole 40 mg  Due to recent changes in healthcare laws, you may see the results of your imaging and laboratory studies on MyChart before your provider has had a chance to review them.  We understand that in some cases there may be results that are confusing or concerning to you. Not all laboratory results come back in the same time frame and the provider may be waiting for multiple results in order to interpret others.  Please give [place] 48 hours in order for your provider to thoroughly review all the results before contacting the office for clarification of your results.   Thank you for trusting me with your gastrointestinal care!   Alcide Evener, CRNP

## 2022-10-19 NOTE — Progress Notes (Signed)
10/19/2022 Megan Valentine [id number] [birth year]   Chief Complaint: Right sided abdominal pain   History of Present Illness: Megan Valentine is a 72 year old female with a past medical history of arthritis, asthma, migraine headaches, CKD stage III, GERD, diverticulosis and colon polyps. Past appendectomy age 58, hysterectomy 1999, cholecystectomy 11/2018, rectocele repair 08/2018, right and left knee replacement surgery. She is known by Dr. Tomasa Rand.  She was diagnosed with Covid 12 September 2022 which resulted in a lot of coughing and she subsequently developed RUQ pain which she initially thought was due to coughing. However, her coughing abated and she continued to have constant right sided abdominal pain, sometimes feels like fire and other times feels like a dull ache and is unrelated to eating or change of position. Her RUQ pain sometimes radiates to the back.  She has infrequent mild nausea without vomiting.  She has chronic constipation with a variable bowel pattern for the past three years. She takes Docusate sodium 2 to 4 capsule and Senna 17.2 mg 4 to 8 tablets daily which results in passing soft or loose stools. She's had episodes of increased urgency with fecal incontinence, has occurred 5 to 6 times within the past year. No bloody or black stools.  She underwent an EGD and colonoscopy 11/30/2021.  The EGD showed a 3 cm hiatal hernia otherwise was normal.  The colonoscopy identified 8 tubular adenomatous polyps removed from the colon, diverticulosis, melanosis coli and nonbleeding internal hemorrhoids.  She was advised to repeat a colonoscopy in 3 years.     Latest Ref Rng & Units 05/06/2022    1:44 PM 05/03/2022   10:04 AM 01/04/2022    9:59 AM  CBC  WBC 4.0 - 10.5 K/uL 8.3  5.8  5.9   Hemoglobin 12.0 - 15.0 g/dL 41.6  60.6  30.1   Hematocrit 36.0 - 46.0 % 39.5  41.6  41.5   Platelets 150.0 - 400.0 K/uL 347.0  351.0  315.0        Latest Ref Rng & Units 07/29/2022    8:38 AM  06/16/2022   10:31 AM 05/03/2022   10:04 AM  CMP  Glucose 70 - 99 mg/dL 601  95  94   BUN 6 - 23 mg/dL 15  15  15    Creatinine 0.40 - 1.20 mg/dL 0.93  2.35  5.73   Sodium 135 - 145 mEq/L 139  143  142   Potassium 3.5 - 5.1 mEq/L 4.1  4.2  4.4   Chloride 96 - 112 mEq/L 106  108  107   CO2 19 - 32 mEq/L 25  22  25    Calcium 8.4 - 10.5 mg/dL 9.2  9.1  9.5        Latest Ref Rng & Units 01/04/2022    9:59 AM 05/13/2021    4:57 PM 09/30/2020   11:15 AM  Hepatic Function  Total Protein 6.0 - 8.3 g/dL 6.8  6.8  7.0   Albumin 3.5 - 5.2 g/dL 4.2  4.1  4.1   AST 0 - 37 U/L 15  18  16    ALT 0 - 35 U/L 13  12  15    Alk Phosphatase 39 - 117 U/L 80  65  57   Total Bilirubin 0.2 - 1.2 mg/dL 0.5  0.3  0.4     EGD 220/25/4270: - Normal esophagus.  - Gastroesophageal flap valve classified as Hill Grade IV (no fold, wide open lumen, hiatal  hernia present).  - 3 cm hiatal hernia.  - Normal stomach.  - Normal examined duodenum.  - No specimens collected.   Colonoscopy 11/30/2021: - Five 4 to 12 mm polyps in the ascending colon, removed with a cold snare. Resected and retrieved.  - One 6 mm polyp at the hepatic flexure, removed with a cold snare. Resected and retrieved.  - Two 4 to 5 mm polyps in the transverse colon, removed with a cold snare. Resected and retrieved.  - Diverticulosis in the sigmoid colon, in the descending colon and in the transverse colon.  - Melanosis in the colon.  - Non-bleeding internal hemorrhoids.  - 3 year recall colonoscopy  1. Surgical [P], colon, hepatic flexure and ascending, polyp (6) - TUBULAR ADENOMA, FRAGMENTS. 2. Surgical [P], colon, transverse, polyp (2) - TUBULAR ADENOMA, FRAGMENTS.  EGD 04/27/2011: 4cm hiatal hernia  Esophagus dilated with a 18F Maloney dilator   Colonoscopy 04/06/2011: Melanosis throughout the colon No polyps or cancers Diverticulosis, mild, left-sided diverticulosis   EGD 01/06/2003: 3 cm hiatal hernia   Colonoscopy  02/07/2001: Diverticulosis Internal hemorrhoids   Colonoscopy 04/20/1998: 5 mm hyperplastic rectal polyp  Past Medical History:  Diagnosis Date   Allergy    Arthritis    Asthma    Cataract    Chronic bronchitis (HCC)    Chronic kidney disease    stage 3, kidney infections   Complication of anesthesia    hard to wake up   Diverticulosis    Environmental allergies    GERD (gastroesophageal reflux disease)    Hiatal hernia    History of chicken pox    Hyperlipidemia    Hypothyroidism    IBS (irritable bowel syndrome)    Migraines    Mitral valve prolapse    Per pt . per Dr.Henry  [name].  does not have MVP   Neuromuscular disorder (HCC)    neuropathy feet   Pneumonia    Rectal prolapse    Past Surgical History:  Procedure Laterality Date   ABDOMINAL HYSTERECTOMY  1999   APPENDECTOMY  1962   BREAST CYST ASPIRATION Bilateral    CHOLECYSTECTOMY N/A 12/17/2018   Procedure: LAPAROSCOPIC CHOLECYSTECTOMY;  Surgeon: Berna Bue, MD;  Location: [facility] OR;  Service: General;  Laterality: N/A;   COLONOSCOPY     EYE SURGERY     cataract   JOINT REPLACEMENT     rt knee scope   REFRACTIVE SURGERY  2000   both eyes   retrocele repair     and bladder sling   TOTAL KNEE ARTHROPLASTY Right 03/24/2015   Procedure: TOTAL KNEE ARTHROPLASTY;  Surgeon: Valeria Batman, MD;  Location: [facility] OR;  Service: Orthopedics;  Laterality: Right;   TOTAL KNEE ARTHROPLASTY Left 11/17/2020   Procedure: LEFT TOTAL KNEE ARTHROPLASTY;  Surgeon: Valeria Batman, MD;  Location: [facility];  Service: Orthopedics;  Laterality: Left;   TUBAL LIGATION  1977   UPPER GASTROINTESTINAL ENDOSCOPY     Current Outpatient Medications on File Prior to Visit  Medication Sig Dispense Refill   acetaminophen (TYLENOL) 325 MG tablet Take 2 tablets (650 mg total) by mouth every 6 (six) hours as needed for moderate pain. 30 tablet 0   albuterol (VENTOLIN HFA) 108 (90 Base) MCG/ACT inhaler USE 1 TO 2 PUFFS EVERY 6 HOURS  AS NEEDED FOR WHEEZING OR SHORTNESS OF BREATH 18 g 5   aspirin 81 MG chewable tablet Chew 1 tablet (81 mg total) by mouth 2 (two) times daily.  Budeson-Glycopyrrol-Formoterol (BREZTRI AEROSPHERE) 160-9-4.8 MCG/ACT AERO Inhale 2 puffs into the lungs in the morning and at bedtime. 10.7 g 11   Calcium Carbonate Antacid 1177 MG CHEW Chew 1 tablet by mouth. As needed     Cranberry 12600 MG CAPS Take 1 tablet by mouth. daily     diclofenac Sodium (VOLTAREN ARTHRITIS PAIN) 1 % GEL Apply a small grape sized dollop 1 disorder 2 weeks of pends up to 4 times daily as needed 150 g 1   docusate sodium (COLACE) 250 MG capsule Take 250 mg by mouth daily. Takes 4 daily     esomeprazole (NEXIUM) 40 MG capsule TAKE 1 CAPSULE BY MOUTH ONCE DAILY 30 MINUTES BEFORE BREAKFAST 90 capsule 1   estradiol (ESTRACE) 0.5 MG tablet Take 0.5 mg by mouth daily.     ezetimibe (ZETIA) 10 MG tablet Take 1 tablet (10 mg total) by mouth daily. 90 tablet 3   famotidine (PEPCID) 20 MG tablet Take 20 mg by mouth daily.     fluticasone (FLONASE) 50 MCG/ACT nasal spray Place 1 spray into both nostrils in the morning and at bedtime. 16 g 0   hydroxypropyl methylcellulose / hypromellose (ISOPTO TEARS / GONIOVISC) 2.5 % ophthalmic solution Place 1 drop into both eyes 4 (four) times daily as needed for dry eyes.     ipratropium-albuterol (DUONEB) 0.5-2.5 (3) MG/3ML SOLN Take 3 mLs by nebulization every 6 (six) hours as needed. 360 mL 11   levocetirizine (XYZAL) 5 MG tablet TAKE 1 TABLET BY MOUTH ONCE DAILY IN THE EVENING 30 tablet 3   levothyroxine (SYNTHROID) 112 MCG tablet Take 1 tablet (112 mcg total) by mouth daily before breakfast. 90 tablet 3   Sennosides 17.2 MG TABS Take 1 tablet by mouth. 4 capsules daily     No current facility-administered medications on file prior to visit.   Allergies  Allergen Reactions   Tetracycline Hives and Itching   Dilaudid [Hydromorphone Hcl] Nausea And Vomiting   Other     Cigarette smoke and  perfumes--flares asthma (develops bronchitis)   Current Medications, Allergies, Past Medical History, Past Surgical History, Family History and Social History were reviewed in [employer] record.  Review of Systems:   Constitutional: Negative for fever, sweats, chills or weight loss.  Respiratory: Negative for shortness of breath.   Cardiovascular: + Leg swelling.  Gastrointestinal: See HPI.  Musculoskeletal: Negative for back pain or muscle aches.  Neurological: Negative for dizziness, headaches or paresthesias.   Physical Exam: BP 118/64   Pulse 65   Ht 5' 4.25" (1.632 m)   Wt 179 lb (81.2 kg)   BMI 30.49 kg/m  Wt Readings from Last 3 Encounters:  10/19/22 179 lb (81.2 kg)  08/03/22 183 lb 9.6 oz (83.3 kg)  07/29/22 183 lb 9.6 oz (83.3 kg)  General: 72 year old female in no acute distress. Head: Normocephalic and atraumatic. Eyes: No scleral icterus. Conjunctiva pink . Ears: Normal auditory acuity. Mouth: Dentition intact. No ulcers or lesions.  Lungs: Clear throughout to auscultation. Heart: Regular rate and rhythm, no murmur. Abdomen: Soft, nondistended.  Mild tenderness to the RUQ without rebound or guarding.  Negative CVA tenderness. No masses or hepatomegaly. Normal bowel sounds x 4 quadrants.  Rectal: Deferred.  Musculoskeletal: Symmetrical with no gross deformities. Extremities: No edema. Neurological: Alert oriented x 4. No focal deficits.  Psychological: Alert and cooperative. Normal mood and affect  Assessment and Recommendations:  72 year old female with RUQ which sometimes radiates to the back initially triggered  secondary to excessive coughing with COVID infection 08/2022. RUQ pain is constant and is unrelated to eating or change of position.  Infrequent nausea without vomiting. -CBC, CMP -RUQ sonogram to evaluate the liver -Consider CTAP if RUQ sonogram unrevealing and if symptoms persist -Continue Esomeprazole 40 mg daily.  Famotidine  20 mg nightly.  Chronic constipation with intermittent episodes of increased bowel urgency and fecal incontinence in setting of taking higher than recommended doses of docusate sodium and senna laxative -Trial with Linzess 145 mcg 1 tab to be taken 30 minutes before breakfast -Patient instructed to stop taking senna laxative the night before she starts her first dose of Linzess -May continue docusate sodium 1 to 2 capsules daily  History of colon polyp, 8 tubular adenomatous polyps removed from the colon per colonoscopy 11/2021 -Next colonoscopy due 11/2024

## 2022-10-20 NOTE — Progress Notes (Signed)
Agree with the assessment and plan as outlined by Alcide Evener, NP.  Would also consider H. Pylori stool antigen if RUQUS unrevealing.   Cortnie Ringel E. Tomasa Rand, MD [facility]

## 2022-10-21 DIAGNOSIS — H40022 Open angle with borderline findings, high risk, left eye: Secondary | ICD-10-CM | POA: Diagnosis not present

## 2022-10-25 ENCOUNTER — Other Ambulatory Visit: Payer: Self-pay | Admitting: Nurse Practitioner

## 2022-10-25 ENCOUNTER — Ambulatory Visit (HOSPITAL_COMMUNITY)
Admission: RE | Admit: 2022-10-25 | Discharge: 2022-10-25 | Disposition: A | Discharge status: Home / Self Care | Payer: PPO | Source: Ambulatory Visit | Attending: Nurse Practitioner | Admitting: Nurse Practitioner

## 2022-10-25 DIAGNOSIS — K5900 Constipation, unspecified: Secondary | ICD-10-CM | POA: Diagnosis not present

## 2022-10-25 DIAGNOSIS — Z9049 Acquired absence of other specified parts of digestive tract: Secondary | ICD-10-CM | POA: Diagnosis not present

## 2022-10-25 DIAGNOSIS — K219 Gastro-esophageal reflux disease without esophagitis: Secondary | ICD-10-CM

## 2022-10-25 DIAGNOSIS — R1011 Right upper quadrant pain: Secondary | ICD-10-CM | POA: Insufficient documentation

## 2022-10-25 DIAGNOSIS — K7689 Other specified diseases of liver: Secondary | ICD-10-CM | POA: Diagnosis not present

## 2022-10-26 ENCOUNTER — Ambulatory Visit
Admission: RE | Admit: 2022-10-26 | Discharge: 2022-10-26 | Disposition: A | Discharge status: Home / Self Care | Payer: PPO | Source: Ambulatory Visit | Attending: Family Medicine | Admitting: Family Medicine

## 2022-10-26 DIAGNOSIS — Z1231 Encounter for screening mammogram for malignant neoplasm of breast: Secondary | ICD-10-CM | POA: Diagnosis not present

## 2022-10-28 NOTE — Telephone Encounter (Signed)
Contacted pt & pt verbalized understanding to pick up [employer] stool kit for testing.

## 2022-10-28 NOTE — Telephone Encounter (Signed)
Contacted pt.-LVM

## 2022-10-28 NOTE — Telephone Encounter (Signed)
PT returning call. Please advise.

## 2022-10-31 ENCOUNTER — Ambulatory Visit (INDEPENDENT_AMBULATORY_CARE_PROVIDER_SITE_OTHER): Payer: PPO | Admitting: Family Medicine

## 2022-10-31 ENCOUNTER — Encounter: Payer: Self-pay | Admitting: Family Medicine

## 2022-10-31 VITALS — BP 116/78 | HR 77 | Temp 97.3°F | Ht 64.0 in | Wt 179.0 lb

## 2022-10-31 DIAGNOSIS — R7303 Prediabetes: Secondary | ICD-10-CM | POA: Diagnosis not present

## 2022-10-31 DIAGNOSIS — E876 Hypokalemia: Secondary | ICD-10-CM | POA: Diagnosis not present

## 2022-10-31 DIAGNOSIS — E7800 Pure hypercholesterolemia, unspecified: Secondary | ICD-10-CM | POA: Diagnosis not present

## 2022-10-31 DIAGNOSIS — N1831 Chronic kidney disease, stage 3a: Secondary | ICD-10-CM | POA: Diagnosis not present

## 2022-10-31 LAB — LDL CHOLESTEROL, DIRECT: Direct LDL: 100 mg/dL

## 2022-10-31 LAB — BASIC METABOLIC PANEL
BUN: 14 mg/dL (ref 6–23)
CO2: 27 meq/L (ref 19–32)
Calcium: 9 mg/dL (ref 8.4–10.5)
Chloride: 105 meq/L (ref 96–112)
Creatinine, Ser: 0.91 mg/dL (ref 0.40–1.20)
GFR: 62.98 mL/min (ref >60.00)
Glucose, Bld: 99 mg/dL (ref 70–99)
Potassium: 3.3 meq/L — ABNORMAL LOW (ref 3.5–5.1)
Sodium: 142 meq/L (ref 135–145)

## 2022-10-31 LAB — HEMOGLOBIN A1C: Hgb A1c MFr Bld: 5.9 % (ref 4.6–6.5)

## 2022-10-31 NOTE — Progress Notes (Signed)
Established Patient Office Visit   Subjective:  Patient ID: Megan Valentine, female    DOB: 28-Aug-1950  Age: 72 y.o. MRN: [id number]  Chief Complaint  Patient presents with   Medical Management of Chronic Issues    3 months follow up. Pt is fasting. Pt wants lipid and A1c checked.     HPI Encounter Diagnoses  Name Primary?   Prediabetes Yes   Elevated cholesterol    Stage 3a chronic kidney disease (HCC)    Follow-up of above.  Glucose has been elevated and other lab work.  Tolerating the Zetia well without issue.  Continues to hydrate well.  She has been able to lose a little bit of weight.   Review of Systems  Constitutional: Negative.   HENT: Negative.    Eyes:  Negative for blurred vision, discharge and redness.  Respiratory: Negative.    Cardiovascular: Negative.   Gastrointestinal:  Negative for abdominal pain.  Genitourinary: Negative.   Musculoskeletal: Negative.  Negative for myalgias.  Skin:  Negative for rash.  Neurological:  Negative for tingling, loss of consciousness and weakness.  Endo/Heme/Allergies:  Negative for polydipsia.     Current Outpatient Medications:    acetaminophen (TYLENOL) 325 MG tablet, Take 2 tablets (650 mg total) by mouth every 6 (six) hours as needed for moderate pain., Disp: 30 tablet, Rfl: 0   albuterol (VENTOLIN HFA) 108 (90 Base) MCG/ACT inhaler, USE 1 TO 2 PUFFS EVERY 6 HOURS AS NEEDED FOR WHEEZING OR SHORTNESS OF BREATH, Disp: 18 g, Rfl: 5   aspirin 81 MG chewable tablet, Chew 1 tablet (81 mg total) by mouth 2 (two) times daily., Disp: , Rfl:    Budeson-Glycopyrrol-Formoterol (BREZTRI AEROSPHERE) 160-9-4.8 MCG/ACT AERO, Inhale 2 puffs into the lungs in the morning and at bedtime., Disp: 10.7 g, Rfl: 11   Calcium Carbonate Antacid 1177 MG CHEW, Chew 1 tablet by mouth. As needed, Disp: , Rfl:    Cranberry 12600 MG CAPS, Take 1 tablet by mouth. daily, Disp: , Rfl:    diclofenac Sodium (VOLTAREN ARTHRITIS PAIN) 1 % GEL, Apply a small  grape sized dollop 1 disorder 2 weeks of pends up to 4 times daily as needed, Disp: 150 g, Rfl: 1   docusate sodium (COLACE) 250 MG capsule, Take 250 mg by mouth daily. Takes 4 daily, Disp: , Rfl:    esomeprazole (NEXIUM) 40 MG capsule, Take 1 capsule (40 mg total) by mouth 2 (two) times daily. Take 30 minutes before breakfast and dinner., Disp: 60 capsule, Rfl: 1   estradiol (ESTRACE) 0.5 MG tablet, Take 0.5 mg by mouth daily., Disp: , Rfl:    ezetimibe (ZETIA) 10 MG tablet, Take 1 tablet (10 mg total) by mouth daily., Disp: 90 tablet, Rfl: 3   famotidine (PEPCID) 20 MG tablet, Take 20 mg by mouth daily., Disp: , Rfl:    fluticasone (FLONASE) 50 MCG/ACT nasal spray, Place 1 spray into both nostrils in the morning and at bedtime., Disp: 16 g, Rfl: 0   hydroxypropyl methylcellulose / hypromellose (ISOPTO TEARS / GONIOVISC) 2.5 % ophthalmic solution, Place 1 drop into both eyes 4 (four) times daily as needed for dry eyes., Disp: , Rfl:    ipratropium-albuterol (DUONEB) 0.5-2.5 (3) MG/3ML SOLN, Take 3 mLs by nebulization every 6 (six) hours as needed., Disp: 360 mL, Rfl: 11   levocetirizine (XYZAL) 5 MG tablet, TAKE 1 TABLET BY MOUTH ONCE DAILY IN THE EVENING, Disp: 30 tablet, Rfl: 3   levothyroxine (SYNTHROID) 112 MCG tablet,  Take 1 tablet (112 mcg total) by mouth daily before breakfast., Disp: 90 tablet, Rfl: 3   linaclotide (LINZESS) 145 MCG CAPS capsule, Take 1 capsule (145 mcg total) by mouth daily. Take 30 minutes before breakfast., Disp: 30 capsule, Rfl: 1   Sennosides 17.2 MG TABS, Take 1 tablet by mouth. 4 capsules daily, Disp: , Rfl:    esomeprazole (NEXIUM) 40 MG capsule, TAKE 1 CAPSULE BY MOUTH ONCE DAILY 30 MINUTES BEFORE BREAKFAST, Disp: 90 capsule, Rfl: 1   Objective:     BP 116/78   Pulse 77   Temp (!) 97.3 F (36.3 C)   Ht 5\' 4"  (1.626 m)   Wt 179 lb (81.2 kg)   SpO2 98%   BMI 30.73 kg/m  Wt Readings from Last 3 Encounters:  10/31/22 179 lb (81.2 kg)  10/19/22 179 lb (81.2  kg)  08/03/22 183 lb 9.6 oz (83.3 kg)      Physical Exam Constitutional:      General: She is not in acute distress.    Appearance: Normal appearance. She is not ill-appearing, toxic-appearing or diaphoretic.  HENT:     Head: Normocephalic and atraumatic.     Right Ear: External ear normal.     Left Ear: External ear normal.     Mouth/Throat:     Mouth: Mucous membranes are moist.     Pharynx: Oropharynx is clear. No oropharyngeal exudate or posterior oropharyngeal erythema.  Eyes:     General: No scleral icterus.       Right eye: No discharge.        Left eye: No discharge.     Extraocular Movements: Extraocular movements intact.     Conjunctiva/sclera: Conjunctivae normal.     Pupils: Pupils are equal, round, and reactive to light.  Cardiovascular:     Rate and Rhythm: Normal rate and regular rhythm.  Pulmonary:     Effort: Pulmonary effort is normal. No respiratory distress.     Breath sounds: Normal breath sounds. No wheezing, rhonchi or rales.  Abdominal:     General: Bowel sounds are normal.  Musculoskeletal:     Cervical back: No rigidity or tenderness.  Skin:    General: Skin is warm and dry.  Neurological:     Mental Status: She is alert and oriented to person, place, and time.  Psychiatric:        Mood and Affect: Mood normal.        Behavior: Behavior normal.      No results found for any visits on 10/31/22.    The 10-year ASCVD risk score (Arnett DK, et al., 2019) is: 9.7%    Assessment & Plan:   Prediabetes -     Basic metabolic panel -     Hemoglobin A1c  Elevated cholesterol -     LDL cholesterol, direct  Stage 3a chronic kidney disease (HCC) -     Basic metabolic panel    Return in about 3 months (around 01/31/2023).  We discussed starting metformin pending A1c results.  Discussed side effects that are usually transient.  Information was given on metformin.  Continue Zetia.  Encouraged regular exercise for 30 minutes most days of the  week.  Mliss Sax, MD

## 2022-11-01 ENCOUNTER — Encounter: Payer: Self-pay | Admitting: Family Medicine

## 2022-11-01 DIAGNOSIS — E876 Hypokalemia: Secondary | ICD-10-CM

## 2022-11-01 MED ORDER — POTASSIUM CHLORIDE CRYS ER 20 MEQ PO TBCR
20.0000 meq | EXTENDED_RELEASE_TABLET | Freq: Every day | ORAL | 3 refills | Status: DC
Start: 2022-11-01 — End: 2023-07-21

## 2022-11-01 MED ORDER — METFORMIN HCL ER 500 MG PO TB24
500.0000 mg | ORAL_TABLET | Freq: Every evening | ORAL | 1 refills | Status: DC
Start: 2022-11-01 — End: 2023-03-02

## 2022-11-01 NOTE — Addendum Note (Signed)
Addended by: Andrez Grime on: 11/01/2022 04:48 PM   Modules accepted: Orders

## 2022-11-16 NOTE — Telephone Encounter (Signed)
Megan Valentine, please check my inbox on my desk to see if her H. Pylori [employer]rix stool test result received, if not, pls obtain result and let me know when received. Thank you.

## 2022-11-18 ENCOUNTER — Other Ambulatory Visit: Payer: Self-pay

## 2022-11-18 MED ORDER — LINACLOTIDE 290 MCG PO CAPS: 290.0000 ug | ORAL_CAPSULE | Freq: Every day | ORAL | 1 refills | Status: DC

## 2022-11-18 NOTE — Telephone Encounter (Signed)
Kaira, I did not receive her SIBO result, pls obtain the result report.   Ok to send in RX for [employer] one tab po every day be taken 30 minutes before breakfast #30 with 1 refill.  Thank you.

## 2022-11-24 NOTE — Telephone Encounter (Signed)
DD, can you check my inbox in my office as I am working in the hospital and verify if her H. Pylori test result was received. THX.   Glendora Score, my error when asking you to check on SIBO, meant H. Pylori. Thanks for the follow up.

## 2022-11-24 NOTE — Telephone Encounter (Signed)
Megan Valentine, I spoke with Theodoro Grist from [facility] & there is no order/SIBO results for this patient. Did you want her to complete one? I only see where [employer] was mentioned which has already resulted.

## 2022-11-24 NOTE — Telephone Encounter (Signed)
Logged into the [employer] website & reprinted the [employer] h pylori report.

## 2022-11-24 NOTE — Telephone Encounter (Signed)
Left message for [facility] to call back.

## 2022-11-25 ENCOUNTER — Other Ambulatory Visit: Payer: Self-pay

## 2022-11-25 ENCOUNTER — Telehealth: Payer: Self-pay | Admitting: Nurse Practitioner

## 2022-11-25 DIAGNOSIS — R1031 Right lower quadrant pain: Secondary | ICD-10-CM

## 2022-11-25 NOTE — Telephone Encounter (Signed)
Scheduled patient for CT 11/30/22 at 3:00 pm at [facility]. She will need to arrive by 12:45 pm. She has been made aware & verbalized all understanding.

## 2022-11-25 NOTE — Telephone Encounter (Signed)
See phone note, I reviewed negative H. pylori [employer] stool test with the patient.

## 2022-11-25 NOTE — Telephone Encounter (Signed)
Megan Valentine, I called the patient and discussed her H. pylori [employer] stool test was negative.  She continues to have right mid abdominal pain which radiates to the RLQ area.  She is tolerating Linzess.  Please schedule her for abdominal/pelvic CT scan with oral and IV contrast.  Patient was instructed to go to the emergency room if she develops severe abdominal pain.  Thank you

## 2022-11-29 DIAGNOSIS — L720 Epidermal cyst: Secondary | ICD-10-CM | POA: Diagnosis not present

## 2022-11-29 DIAGNOSIS — I788 Other diseases of capillaries: Secondary | ICD-10-CM | POA: Diagnosis not present

## 2022-11-29 DIAGNOSIS — L218 Other seborrheic dermatitis: Secondary | ICD-10-CM | POA: Diagnosis not present

## 2022-11-30 ENCOUNTER — Ambulatory Visit (HOSPITAL_COMMUNITY)
Admission: RE | Admit: 2022-11-30 | Discharge: 2022-11-30 | Disposition: A | Discharge status: Home / Self Care | Payer: PPO | Source: Ambulatory Visit | Attending: Nurse Practitioner | Admitting: Nurse Practitioner

## 2022-11-30 DIAGNOSIS — K5730 Diverticulosis of large intestine without perforation or abscess without bleeding: Secondary | ICD-10-CM | POA: Diagnosis not present

## 2022-11-30 DIAGNOSIS — K449 Diaphragmatic hernia without obstruction or gangrene: Secondary | ICD-10-CM | POA: Diagnosis not present

## 2022-11-30 DIAGNOSIS — K7689 Other specified diseases of liver: Secondary | ICD-10-CM | POA: Diagnosis not present

## 2022-11-30 DIAGNOSIS — R1031 Right lower quadrant pain: Secondary | ICD-10-CM | POA: Insufficient documentation

## 2022-11-30 MED ORDER — IOHEXOL 300 MG/ML  SOLN
100.0000 mL | Freq: Once | INTRAMUSCULAR | Status: AC | PRN
  Administered 2022-11-30: 100 mL via INTRAVENOUS

## 2022-12-02 DIAGNOSIS — H40052 Ocular hypertension, left eye: Secondary | ICD-10-CM | POA: Diagnosis not present

## 2022-12-02 DIAGNOSIS — H40022 Open angle with borderline findings, high risk, left eye: Secondary | ICD-10-CM | POA: Diagnosis not present

## 2022-12-02 DIAGNOSIS — H40011 Open angle with borderline findings, low risk, right eye: Secondary | ICD-10-CM | POA: Diagnosis not present

## 2022-12-08 ENCOUNTER — Encounter: Payer: Self-pay | Admitting: Nurse Practitioner

## 2022-12-13 ENCOUNTER — Other Ambulatory Visit: Payer: Self-pay | Admitting: Nurse Practitioner

## 2022-12-22 ENCOUNTER — Other Ambulatory Visit: Payer: Self-pay | Admitting: Pulmonary Disease

## 2022-12-22 DIAGNOSIS — J45909 Unspecified asthma, uncomplicated: Secondary | ICD-10-CM

## 2022-12-24 ENCOUNTER — Other Ambulatory Visit: Payer: Self-pay | Admitting: Family Medicine

## 2022-12-24 DIAGNOSIS — E039 Hypothyroidism, unspecified: Secondary | ICD-10-CM

## 2022-12-26 ENCOUNTER — Ambulatory Visit: Payer: PPO | Admitting: Family Medicine

## 2022-12-26 ENCOUNTER — Other Ambulatory Visit: Payer: Self-pay

## 2022-12-26 ENCOUNTER — Ambulatory Visit (INDEPENDENT_AMBULATORY_CARE_PROVIDER_SITE_OTHER): Payer: PPO

## 2022-12-26 ENCOUNTER — Encounter: Payer: Self-pay | Admitting: Family Medicine

## 2022-12-26 VITALS — BP 122/82 | HR 84 | Ht 64.0 in | Wt 178.0 lb

## 2022-12-26 DIAGNOSIS — M79644 Pain in right finger(s): Secondary | ICD-10-CM

## 2022-12-26 DIAGNOSIS — M25521 Pain in right elbow: Secondary | ICD-10-CM | POA: Diagnosis not present

## 2022-12-26 DIAGNOSIS — M19041 Primary osteoarthritis, right hand: Secondary | ICD-10-CM | POA: Diagnosis not present

## 2022-12-26 NOTE — Progress Notes (Signed)
I, Stevenson Clinch, CMA acting as a scribe for Clementeen Graham, MD.  Megan Valentine is a 72 y.o. female who presents to [facility] at [facility] today for R hand and elbow pain. Pt was previously seen by Dr. Katrinka Blazing on 02/21/22 for R hip and low back pain.  Today, pt c/o R elbow pain x 3 months, worsening since onset. Responded well to steroid injection about 5 years ago. Pt locates pain to lateral aspect of the elbow. Sx worse with lifting. Denies weakness, decreased grip strength, radicular sx.   Right 3rd finger pain x 4 months, after slamming finger. Swelling present.   Radiates: no Paresthesia: no Grip strength: normal Aggravates: use Treatments tried: topical analgesic  Pertinent review of systems: No fevers or chills  Relevant historical information: Hand arthritis   Exam:  BP 122/82   Pulse 84   Ht 5\' 4"  (1.626 m)   Wt 178 lb (80.7 kg)   SpO2 98%   BMI 30.55 kg/m  General: Well Developed, well nourished, and in no acute distress.   MSK: Elbow normal appearing Tender palpation at lateral epicondyle.  Normal elbow motion.  Intact strength.  Pain with resisted wrist and finger extension located in the lateral elbow.  Right hand some swelling present at right third DIP.  Mildly tender palpation.  Normal motion.  Lab and Radiology Results  Procedure: Real-time Ultrasound Guided Injection of right elbow lateral epicondyle common extensor tendon origin Device: Philips Affiniti 50G/GE Logiq Images permanently stored and available for review in PACS Verbal informed consent obtained.  Discussed risks and benefits of procedure. Warned about infection, bleeding, hyperglycemia damage to structures among others. Patient expresses understanding and agreement Time-out conducted.   Noted no overlying erythema, induration, or other signs of local infection.   Skin prepped in a sterile fashion.   Local anesthesia: Topical Ethyl chloride.   With sterile technique and  under real time ultrasound guidance: 40 mg of Kenalog and 1 mL of Marcaine injected into common extensor tendon origin. Fluid seen entering the tendon.   Completed without difficulty   Pain immediately resolved suggesting accurate placement of the medication.   Advised to call if fevers/chills, erythema, induration, drainage, or persistent bleeding.   Images permanently stored and available for review in the ultrasound unit.  Impression: Technically successful ultrasound guided injection.    X-ray images right third digit obtained today personally and independently interpreted Significant osteoarthritis at the third DIP.  No acute fractures are visible. Await formal radiology review    Assessment and Plan: 72 y.o. female with right lateral elbow pain.  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  Plan for injection and home exercises today.  Check back as needed.  Consider hand therapy in the future.  Exacerbation of arthritis right hand third digit DIP.  Plan for Voltaren gel heat and home exercise program.  Consider occupational therapy referral if needed in the future.   PDMP not reviewed this encounter. Orders Placed This Encounter  Procedures   [place] LIMITED JOINT SPACE STRUCTURES UP RIGHT(NO LINKED CHARGES)    Order Specific Question:   Reason for Exam (SYMPTOM  OR DIAGNOSIS REQUIRED)    Answer:   right finger, right elbow    Order Specific Question:   Preferred imaging location?    Answer:   [facility] Finger Middle Right    Standing Status:   Future    Number  of Occurrences:   1    Standing Expiration Date:   01/26/2023    Order Specific Question:   Reason for Exam (SYMPTOM  OR DIAGNOSIS REQUIRED)    Answer:   right 3rd finger injury    Order Specific Question:   Preferred imaging location?    Answer:   [facility]   No orders of the defined types were placed in this  encounter.    Discussed warning signs or symptoms. Please see discharge instructions. Patient expresses understanding.   The above documentation has been reviewed and is accurate and complete Clementeen Graham, M.D.

## 2022-12-26 NOTE — Patient Instructions (Addendum)
Thank you for coming in today.  Please get an Xray today before you leave  You received an injection today. Seek immediate medical attention if the joint becomes red, extremely painful, or is oozing fluid.  Exercise with modeling clay, occupation therapy is no improvement

## 2022-12-28 DIAGNOSIS — Z1331 Encounter for screening for depression: Secondary | ICD-10-CM | POA: Diagnosis not present

## 2022-12-28 DIAGNOSIS — Z01419 Encounter for gynecological examination (general) (routine) without abnormal findings: Secondary | ICD-10-CM | POA: Diagnosis not present

## 2023-01-06 NOTE — Progress Notes (Signed)
Right middle finger x-ray shows some arthritis but no broken bones.

## 2023-01-10 ENCOUNTER — Other Ambulatory Visit: Payer: Self-pay | Admitting: Nurse Practitioner

## 2023-01-14 ENCOUNTER — Other Ambulatory Visit: Payer: Self-pay | Admitting: Pulmonary Disease

## 2023-01-14 DIAGNOSIS — J45909 Unspecified asthma, uncomplicated: Secondary | ICD-10-CM

## 2023-01-30 ENCOUNTER — Ambulatory Visit (INDEPENDENT_AMBULATORY_CARE_PROVIDER_SITE_OTHER): Payer: PPO

## 2023-01-30 DIAGNOSIS — Z0000 Encounter for general adult medical examination without abnormal findings: Secondary | ICD-10-CM | POA: Diagnosis not present

## 2023-01-30 NOTE — Progress Notes (Signed)
 Subjective:   Megan Valentine is a 73 y.o. female who presents for Medicare Annual (Subsequent) preventive examination.  Visit Complete: Virtual I connected with  Megan Valentine on 01/30/23 by a audio enabled telemedicine application and verified that I am speaking with the correct person using two identifiers.  Interactive audio and video telecommunications were attempted between this provider and patient, however failed, due to patient having technical difficulties OR patient did not have access to video capability.  We continued and completed visit with audio only.  Patient Location: Home  Provider Location: Office/Clinic  I discussed the limitations of evaluation and management by telemedicine. The patient expressed understanding and agreed to proceed.  Vital Signs: Because this visit was a virtual/telehealth visit, some criteria may be missing or patient reported. Any vitals not documented were not able to be obtained and vitals that have been documented are patient reported.  Patient Medicare AWV questionnaire was completed by the patient on 01/25/2023; I have confirmed that all information answered by patient is correct and no changes since this date.  Cardiac Risk Factors include: advanced age (>2men, >28 women);dyslipidemia     Objective:    Today's Vitals   There is no height or weight on file to calculate BMI.     01/30/2023    4:22 PM 01/31/2022    2:33 PM 01/27/2021    1:40 PM 12/01/2020    2:20 PM 11/17/2020    2:00 PM 11/04/2020   11:35 AM 10/15/2019    3:51 PM  Advanced Directives  Does Patient Have a Medical Advance Directive? Yes Yes Yes Yes Yes Yes Yes  Type of Estate Agent of [place];Living will Healthcare Power of [place];Living will Healthcare Power of [place];Living will Healthcare Power of [place];Living will Healthcare Power of [place];Living will Healthcare Power of [place];Living will Healthcare Power of [place];Living will  Does  patient want to make changes to medical advance directive?    No - Patient declined No - Patient declined No - Patient declined   Copy of Healthcare Power of Attorney in Chart? Yes - validated most recent copy scanned in chart (See row information) Yes - validated most recent copy scanned in chart (See row information) No - copy requested No - copy requested No - copy requested No - copy requested Yes - validated most recent copy scanned in chart (See row information)    Current Medications (verified) Outpatient Encounter Medications as of 01/30/2023  Medication Sig   acetaminophen  (TYLENOL ) 325 MG tablet Take 2 tablets (650 mg total) by mouth every 6 (six) hours as needed for moderate pain.   albuterol  (VENTOLIN  HFA) 108 (90 Base) MCG/ACT inhaler INHALE 1 TO 2 PUFFS BY MOUTH EVERY 6 HOURS AS NEEDED FOR WHEEZING OR SHORTNESS OF BREATH   aspirin  81 MG chewable tablet Chew 1 tablet (81 mg total) by mouth 2 (two) times daily.   Budeson-Glycopyrrol-Formoterol  (BREZTRI  AEROSPHERE) 160-9-4.8 MCG/ACT AERO Inhale 2 puffs into the lungs in the morning and at bedtime.   Calcium Carbonate Antacid 1177 MG CHEW Chew 1 tablet by mouth. As needed   Cranberry 12600 MG CAPS Take 1 tablet by mouth. daily   diclofenac  Sodium (VOLTAREN  ARTHRITIS PAIN) 1 % GEL Apply a small grape sized dollop 1 disorder 2 weeks of pends up to 4 times daily as needed   docusate sodium  (COLACE) 250 MG capsule Take 250 mg by mouth daily. Takes 4 daily   esomeprazole  (NEXIUM ) 40 MG capsule TAKE 1 CAPSULE BY MOUTH  TWICE DAILY 30  MINUTES  BEFORE  BREAKFAST  AND  DINNER   estradiol  (ESTRACE ) 0.5 MG tablet Take 0.5 mg by mouth daily.   ezetimibe  (ZETIA ) 10 MG tablet Take 1 tablet (10 mg total) by mouth daily.   famotidine (PEPCID) 20 MG tablet Take 20 mg by mouth daily.   fluticasone  (FLONASE ) 50 MCG/ACT nasal spray Place 1 spray into both nostrils in the morning and at bedtime.   hydroxypropyl methylcellulose / hypromellose (ISOPTO TEARS /  GONIOVISC) 2.5 % ophthalmic solution Place 1 drop into both eyes 4 (four) times daily as needed for dry eyes.   ipratropium-albuterol  (DUONEB) 0.5-2.5 (3) MG/3ML SOLN Take 3 mLs by nebulization every 6 (six) hours as needed.   levocetirizine (XYZAL ) 5 MG tablet TAKE 1 TABLET BY MOUTH ONCE DAILY IN THE EVENING   levothyroxine  (SYNTHROID ) 112 MCG tablet TAKE 1 TABLET BY MOUTH ONCE DAILY BEFORE BREAKFAST   metFORMIN  (GLUCOPHAGE -XR) 500 MG 24 hr tablet Take 1 tablet (500 mg total) by mouth at bedtime.   potassium chloride  SA (KLOR-CON  M) 20 MEQ tablet Take 1 tablet (20 mEq total) by mouth daily.   Sennosides 17.2 MG TABS Take 1 tablet by mouth. 4 capsules daily   linaclotide  (LINZESS ) 290 MCG CAPS capsule Take 1 capsule (290 mcg total) by mouth daily. Take 30 minutes before breakfast. (Patient not taking: Reported on 01/30/2023)   No facility-administered encounter medications on file as of 01/30/2023.    Allergies (verified) Tetracycline, Dilaudid  [hydromorphone  hcl], and Other   History: Past Medical History:  Diagnosis Date   Allergy    Arthritis    Asthma    Cataract    Chronic bronchitis (HCC)    Chronic kidney disease    stage 3, kidney infections   Complication of anesthesia    hard to wake up   Diverticulosis    Environmental allergies    GERD (gastroesophageal reflux disease)    Hiatal hernia    History of chicken pox    Hyperlipidemia    Hypothyroidism    IBS (irritable bowel syndrome)    Migraines    Mitral valve prolapse    Per pt . per Dr.Henry  [name].  does not have MVP   Neuromuscular disorder (HCC)    neuropathy feet   Pneumonia    Rectal prolapse    Past Surgical History:  Procedure Laterality Date   ABDOMINAL HYSTERECTOMY  1999   APPENDECTOMY  1962   BREAST CYST ASPIRATION Bilateral    CHOLECYSTECTOMY N/A 12/17/2018   Procedure: LAPAROSCOPIC CHOLECYSTECTOMY;  Surgeon: Signe Megan LABOR, MD;  Location: [facility] OR;  Service: General;  Laterality: N/A;    COLONOSCOPY     EYE SURGERY     cataract   JOINT REPLACEMENT     rt knee scope   REFRACTIVE SURGERY  2000   both eyes   retrocele repair     and bladder sling   TOTAL KNEE ARTHROPLASTY Right 03/24/2015   Procedure: TOTAL KNEE ARTHROPLASTY;  Surgeon: Maude LELON Right, MD;  Location: [facility] OR;  Service: Orthopedics;  Laterality: Right;   TOTAL KNEE ARTHROPLASTY Left 11/17/2020   Procedure: LEFT TOTAL KNEE ARTHROPLASTY;  Surgeon: Right Maude LELON, MD;  Location: [facility];  Service: Orthopedics;  Laterality: Left;   TUBAL LIGATION  1977   UPPER GASTROINTESTINAL ENDOSCOPY     Family History  Problem Relation Age of Onset   Stomach cancer Maternal Grandmother    Hypertension Maternal Grandmother    Diabetes Maternal Grandmother  Stroke Maternal Grandmother    Cancer Maternal Grandmother        Stomach cancer   Colon cancer Paternal Grandfather    Heart attack Paternal Grandfather    Hyperlipidemia Mother    Diabetes Mother    Hypertension Mother    Alzheimer's disease Mother    Diabetes Father    Hyperlipidemia Father    Heart disease Father    Heart attack Father    Stroke Paternal Grandmother    Social History   Socioeconomic History   Marital status: Widowed    Spouse name: Not on file   Number of children: Not on file   Years of education: 12   Highest education level: 12th grade  Occupational History   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    Employer: [employer]  Tobacco Use   Smoking status: Former    Current packs/day: 0.00    Types: Cigarettes    Quit date: 03/23/1976    Years since quitting: 46.8   Smokeless tobacco: Never  Vaping Use   Vaping status: Never Used  Substance and Sexual Activity   Alcohol  use: No   Drug use: No   Sexual activity: Yes    Birth control/protection: None, Post-menopausal  Other Topics Concern   Not on file  Social History Narrative   Caffeine Use-yes   Regular exercise-no   Right handed    Lives alone    Social Drivers of Health   Financial Resource Strain: Low Risk  (01/25/2023)   Overall Financial Resource Strain (CARDIA)    Difficulty of Paying Living Expenses: Not hard at all  Food Insecurity: No Food Insecurity (01/25/2023)   Hunger Vital Sign    Worried About Running Out of Food in the Last Year: Never true    Ran Out of Food in the Last Year: Never true  Transportation Needs: No Transportation Needs (01/25/2023)   PRAPARE - Administrator, Civil Service (Medical): No    Lack of Transportation (Non-Medical): No  Physical Activity: Insufficiently Active (01/25/2023)   Exercise Vital Sign    Days of Exercise per Week: 1 day    Minutes of Exercise per Session: 20 min  Stress: No Stress Concern Present (01/25/2023)   [employer] of Occupational Health - Occupational Stress Questionnaire    Feeling of Stress : Not at all  Social Connections: Moderately Integrated (01/25/2023)   Social Connection and Isolation Panel [NHANES]    Frequency of Communication with Friends and Family: More than three times a week    Frequency of Social Gatherings with Friends and Family: More than three times a week    Attends Religious Services: More than 4 times per year    Active Member of [employer] or Organizations: Yes    Attends Banker Meetings: More than 4 times per year    Marital Status: Widowed    Tobacco Counseling Counseling given: Not Answered   Clinical Intake:  Pre-visit preparation completed: Yes  Pain : No/denies pain     Nutritional Risks: None Diabetes: No  How often do you need to have someone help you when you read instructions, pamphlets, or other written materials from your doctor or pharmacy?: 1 - Never  Interpreter Needed?: No  Information entered by :: NAllen LPN   Activities of Daily Living    01/25/2023   12:06 PM 01/31/2022    2:34 PM  In your present state of health, do you have any difficulty  performing the following activities:   Hearing? 0 0  Vision? 0 0  Difficulty concentrating or making decisions? 0 0  Walking or climbing stairs? 0 1  Dressing or bathing? 0 0  Doing errands, shopping? 0 0  Preparing Food and eating ? N N  Using the Toilet? N N  In the past six months, have you accidently leaked urine? N N  Do you have problems with loss of bowel control? N Y  Managing your Medications? N N  Managing your Finances? N N  Housekeeping or managing your Housekeeping? N N    Patient Care Team: Berneta Elsie Sayre, MD as PCP - General (Family Medicine) Dann Candyce RAMAN, MD as PCP - Cardiology (Cardiology) Georjean Darice HERO, MD as Consulting Physician (Neurology)  Indicate any recent Medical Services you may have received from other than [facility] providers in the past year (date may be approximate).     Assessment:   This is a routine wellness examination for Gizell.  Hearing/Vision screen Hearing Screening - Comments:: Denies hearing issues Vision Screening - Comments:: Regular eye exams, [facility]   Goals Addressed             This Visit's Progress    Patient Stated       01/30/2023, wants to lose weight       Depression Screen    01/30/2023    4:23 PM 07/29/2022    8:10 AM 03/31/2022   12:30 PM 03/09/2022    8:09 AM 01/31/2022    2:34 PM 01/04/2022    9:16 AM 09/06/2021    1:07 PM  PHQ 2/9 Scores  PHQ - 2 Score 0 0 0 0 0 0 1    Fall Risk    01/25/2023   12:06 PM 07/29/2022    8:02 AM 03/09/2022    8:09 AM 01/31/2022    2:34 PM 01/28/2022   11:00 AM  Fall Risk   Falls in the past year? 0 0 0 0 0  Number falls in past yr: 0 0 0 0 0  Injury with Fall? 0 0 0 0   Risk for fall due to : Medication side effect No Fall Risks No Fall Risks Medication side effect   Follow up Falls prevention discussed;Falls evaluation completed Falls evaluation completed Falls evaluation completed Falls prevention discussed;Education provided;Falls evaluation completed     MEDICARE RISK AT HOME: Medicare  Risk at Home Any stairs in or around the home?: (Patient-Rptd) Yes If so, are there any without handrails?: (Patient-Rptd) Yes Home free of loose throw rugs in walkways, pet beds, electrical cords, etc?: (Patient-Rptd) Yes Adequate lighting in your home to reduce risk of falls?: (Patient-Rptd) Yes Life alert?: (Patient-Rptd) No Use of a cane, walker or w/c?: (Patient-Rptd) No Grab bars in the bathroom?: (Patient-Rptd) Yes Shower chair or bench in shower?: (Patient-Rptd) No Elevated toilet seat or a handicapped toilet?: (Patient-Rptd) No  TIMED UP AND GO:  Was the test performed?  No    Cognitive Function:        01/30/2023    4:23 PM 01/31/2022    2:35 PM 10/15/2019    4:02 PM  6CIT Screen  What Year? 0 points 0 points 0 points  What month? 0 points 0 points 0 points  What time? 0 points 0 points 0 points  Count back from 20 0 points 0 points 0 points  Months in reverse 0 points 2 points 0 points  Repeat phrase 2 points 4 points 0  points  Total Score 2 points 6 points 0 points    Immunizations Immunization History  Administered Date(s) Administered   Fluad Quad(high Dose 65+) 10/05/2018, 10/10/2019   Influenza Whole 10/09/2015, 11/09/2021   Influenza, High Dose Seasonal PF 10/19/2016, 10/19/2017, 09/30/2020   Influenza,inj,Quad PF,6+ Mos 11/07/2013, 11/04/2014   Influenza-Unspecified 10/25/2010, 10/11/2022   Moderna Covid-19 Vaccine Bivalent Booster 80yrs & up 12/08/2021, 11/19/2022   Moderna Sars-Covid-2 Vaccination 03/07/2019, 04/05/2019, 12/07/2019, 08/21/2020   Pfizer(Comirnaty)Fall Seasonal Vaccine 12 years and older 12/08/2021   Pneumococcal Conjugate-13 06/08/2014   Pneumococcal Polysaccharide-23 07/09/2015   Respiratory Syncytial Virus Vaccine,Recomb Aduvanted(Arexvy) 12/14/2021   Tdap 11/25/2002, 03/24/2013   Zoster Recombinant(Shingrix) 10/24/2016, 12/30/2016   Zoster, Live 08/08/2015    TDAP status: Up to date  Flu Vaccine status: Up to date  Pneumococcal  vaccine status: Up to date  Covid-19 vaccine status: Completed vaccines  Qualifies for Shingles Vaccine? Yes   Zostavax completed Yes   Shingrix Completed?: Yes  Screening Tests Health Maintenance  Topic Date Due   DTaP/Tdap/Td (3 - Td or Tdap) 03/25/2023   MAMMOGRAM  10/26/2023   Medicare Annual Wellness (AWV)  01/30/2024   Colonoscopy  11/30/2024   Pneumonia Vaccine 13+ Years old  Completed   INFLUENZA VACCINE  Completed   DEXA SCAN  Completed   Hepatitis C Screening  Completed   Zoster Vaccines- Shingrix  Completed   HPV VACCINES  Aged Out   COVID-19 Vaccine  Discontinued    Health Maintenance  There are no preventive care reminders to display for this patient.   Colorectal cancer screening: Type of screening: Colonoscopy. Completed 11/30/2021. Repeat every 3 years  Mammogram status: Completed 10/26/2022. Repeat every year  Bone Density status: Completed 11/05/2019.   Lung Cancer Screening: (Low Dose CT Chest recommended if Age 6-80 years, 20 pack-year currently smoking OR have quit w/in 15years.) does not qualify.   Lung Cancer Screening Referral: no  Additional Screening:  Hepatitis C Screening: does qualify; Completed 09/11/2015  Vision Screening: Recommended annual ophthalmology exams for early detection of glaucoma and other disorders of the eye. Is the patient up to date with their annual eye exam?  Yes  Who is the provider or what is the name of the office in which the patient attends annual eye exams? [facility] If pt is not established with a provider, would they like to be referred to a provider to establish care? No .   Dental Screening: Recommended annual dental exams for proper oral hygiene  Diabetic Foot Exam: n/a  Community Resource Referral / Chronic Care Management: CRR required this visit?  No   CCM required this visit?  No     Plan:     I have personally reviewed and noted the following in the patient's chart:   Medical and  social history Use of alcohol , tobacco or illicit drugs  Current medications and supplements including opioid prescriptions. Patient is not currently taking opioid prescriptions. Functional ability and status Nutritional status Physical activity Advanced directives List of other physicians Hospitalizations, surgeries, and ER visits in previous 12 months Vitals Screenings to include cognitive, depression, and falls Referrals and appointments  In addition, I have reviewed and discussed with patient certain preventive protocols, quality metrics, and best practice recommendations. A written personalized care plan for preventive services as well as general preventive health recommendations were provided to patient.     Ardella FORBES Dawn, LPN   08/26/7972   After Visit Summary: (MyChart) Due to this being a  telephonic visit, the after visit summary with patients personalized plan was offered to patient via MyChart   Nurse Notes: none

## 2023-01-30 NOTE — Patient Instructions (Signed)
 Megan Valentine , Thank you for taking time to come for your Medicare Wellness Visit. I appreciate your ongoing commitment to your health goals. Please review the following plan we discussed and let me know if I can assist you in the future.   Referrals/Orders/Follow-Ups/Clinician Recommendations: none  This is a list of the screening recommended for you and due dates:  Health Maintenance  Topic Date Due   DTaP/Tdap/Td vaccine (3 - Td or Tdap) 03/25/2023   Mammogram  10/26/2023   Medicare Annual Wellness Visit  01/30/2024   Colon Cancer Screening  11/30/2024   Pneumonia Vaccine  Completed   Flu Shot  Completed   DEXA scan (bone density measurement)  Completed   Hepatitis C Screening  Completed   Zoster (Shingles) Vaccine  Completed   HPV Vaccine  Aged Out   COVID-19 Vaccine  Discontinued    Advanced directives: (In Chart) A copy of your advanced directives are scanned into your chart should your provider ever need it.  Next Medicare Annual Wellness Visit scheduled for next year: Yes  Insert Preventive Care attachment Insert FALL PREVENTION attachment if needed

## 2023-01-31 ENCOUNTER — Ambulatory Visit: Payer: PPO | Admitting: Family Medicine

## 2023-02-01 ENCOUNTER — Encounter: Payer: Self-pay | Admitting: Pulmonary Disease

## 2023-02-01 ENCOUNTER — Ambulatory Visit: Payer: PPO | Admitting: Pulmonary Disease

## 2023-02-01 VITALS — BP 124/82 | HR 88 | Ht 64.0 in | Wt 177.4 lb

## 2023-02-01 DIAGNOSIS — K219 Gastro-esophageal reflux disease without esophagitis: Secondary | ICD-10-CM

## 2023-02-01 DIAGNOSIS — J4520 Mild intermittent asthma, uncomplicated: Secondary | ICD-10-CM

## 2023-02-01 NOTE — Patient Instructions (Signed)
 VISIT SUMMARY:  During today's visit, we discussed your intermittent raspy voice and severe acid reflux. We reviewed your current medications and made some adjustments to help manage your symptoms more effectively.  YOUR PLAN:  -COPD: Chronic Obstructive Pulmonary Disease (COPD) is a chronic inflammatory lung disease that obstructs airflow from the lungs. You reported an intermittent raspy voice, which may be related to your Breztri  inhaler. We will continue with the Breztri  inhaler and have ordered a spacer to improve its delivery and reduce oral deposition.  -GASTROESOPHAGEAL REFLUX DISEASE (GERD): GERD is a digestive disorder where stomach acid frequently flows back into the tube connecting your mouth and stomach, causing irritation. You reported severe acid reflux at night despite taking Nexium  twice daily and Pepcid. We recommend continuing your current antacid regimen and considering a follow-up with your gastroenterologist, if symptoms persist.  -GENERAL HEALTH MAINTENANCE: We reviewed your routine labs and found them to be normal. Please continue with your current lifestyle and health maintenance practices. We recommend scheduling a follow-up appointment in one year, or sooner if there are any changes in your health status.  INSTRUCTIONS:  Please schedule a follow-up appointment in one year, or sooner if there are any changes in your health status. If your acid reflux symptoms persist, consider following up with your gastroenterologist

## 2023-02-01 NOTE — Progress Notes (Signed)
 Megan Valentine    [id number]    1950-12-21  Primary Care Physician:Kremer, Elsie Sayre, MD  Referring Physician: Berneta Elsie Sayre, MD [address] [place],  [place] [address]  Chief complaint: Follow-up for asthma  HPI: 73 y.o. who  has a past medical history of Allergy, Arthritis, Asthma, Cataract, Chronic bronchitis (HCC), Chronic kidney disease, Complication of anesthesia, Diverticulosis, Environmental allergies, GERD (gastroesophageal reflux disease), Hiatal hernia, History of chicken pox, Hyperlipidemia, Hypothyroidism, IBS (irritable bowel syndrome), Migraines, Mitral valve prolapse, Neuromuscular disorder (HCC), Pneumonia, and Rectal prolapse.   Referred here for evaluation of shortness of breath, coughing for the past 4 months.  Symptoms are associated with occasional wheezing, chest congestion She has history of seasonal allergies, chronic kidney disease  Previously been on Flovent  but it is not covered by her insurance.  Advair was prescribed by her PCP but was too expensive for her.  Pets: Has a dog Occupation: Works as a software engineer Exposures: No exposures.  Denies any exposure to mold, hot tub, Jacuzzi or feather pillows or comforters Smoking history: 13-pack-year smoker.  Quit in 1979 Travel history: No significant travel history Relevant family history: Dad had asthma  Interim history: Discussed the use of AI scribe software for clinical note transcription with the patient, who gave verbal consent to proceed.  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 This symptom was present before starting Breztri , an inhaler for her unspecified respiratory condition. She also reports severe acid reflux, which woke her up the previous night. She is currently on Nexium  twice daily and Pepcid, and also has a large hiatal hernia. She is under the care of a gastroenterologist for this condition. The patient has  been using Breztri , which seems to help her respiratory condition.    Outpatient Encounter Medications as of 02/01/2023  Medication Sig   acetaminophen  (TYLENOL ) 325 MG tablet Take 2 tablets (650 mg total) by mouth every 6 (six) hours as needed for moderate pain.   albuterol  (VENTOLIN  HFA) 108 (90 Base) MCG/ACT inhaler INHALE 1 TO 2 PUFFS BY MOUTH EVERY 6 HOURS AS NEEDED FOR WHEEZING OR SHORTNESS OF BREATH   aspirin  81 MG chewable tablet Chew 1 tablet (81 mg total) by mouth 2 (two) times daily.   Budeson-Glycopyrrol-Formoterol  (BREZTRI  AEROSPHERE) 160-9-4.8 MCG/ACT AERO Inhale 2 puffs into the lungs in the morning and at bedtime.   Calcium Carbonate Antacid 1177 MG CHEW Chew 1 tablet by mouth. As needed   Cranberry 12600 MG CAPS Take 1 tablet by mouth. daily   diclofenac  Sodium (VOLTAREN  ARTHRITIS PAIN) 1 % GEL Apply a small grape sized dollop 1 disorder 2 weeks of pends up to 4 times daily as needed   docusate sodium  (COLACE) 250 MG capsule Take 250 mg by mouth daily. Takes 4 daily   esomeprazole  (NEXIUM ) 40 MG capsule TAKE 1 CAPSULE BY MOUTH TWICE DAILY 30  MINUTES  BEFORE  BREAKFAST  AND  DINNER   estradiol  (ESTRACE ) 0.5 MG tablet Take 0.5 mg by mouth daily.   ezetimibe  (ZETIA ) 10 MG tablet Take 1 tablet (10 mg total) by mouth daily.   famotidine (PEPCID) 20 MG tablet Take 20 mg by mouth daily.   fluticasone  (FLONASE ) 50 MCG/ACT nasal spray Place 1 spray into both nostrils in the morning and at bedtime.   hydroxypropyl methylcellulose / hypromellose (ISOPTO TEARS / GONIOVISC) 2.5 % ophthalmic solution Place 1 drop into both eyes 4 (  four) times daily as needed for dry eyes.   ipratropium-albuterol  (DUONEB) 0.5-2.5 (3) MG/3ML SOLN Take 3 mLs by nebulization every 6 (six) hours as needed.   levocetirizine (XYZAL ) 5 MG tablet TAKE 1 TABLET BY MOUTH ONCE DAILY IN THE EVENING   levothyroxine  (SYNTHROID ) 112 MCG tablet TAKE 1 TABLET BY MOUTH ONCE DAILY BEFORE BREAKFAST   linaclotide  (LINZESS ) 290 MCG  CAPS capsule Take 1 capsule (290 mcg total) by mouth daily. Take 30 minutes before breakfast.   metFORMIN  (GLUCOPHAGE -XR) 500 MG 24 hr tablet Take 1 tablet (500 mg total) by mouth at bedtime.   potassium chloride  SA (KLOR-CON  M) 20 MEQ tablet Take 1 tablet (20 mEq total) by mouth daily.   Sennosides 17.2 MG TABS Take 1 tablet by mouth. 4 capsules daily   No facility-administered encounter medications on file as of 02/01/2023.   Physical Exam: Blood pressure 124/82, pulse 88, height 5' 4 (1.626 m), weight 177 lb 6.4 oz (80.5 kg), SpO2 98%. Gen:      No acute distress HEENT:  EOMI, sclera anicteric Neck:     No masses; no thyromegaly Lungs:    Clear to auscultation bilaterally; normal respiratory effort CV:         Regular rate and rhythm; no murmurs Abd:      + bowel sounds; soft, non-tender; no palpable masses, no distension Ext:    No edema; adequate peripheral perfusion Skin:      Warm and dry; no rash Neuro: alert and oriented x 3 Psych: normal mood and affect   Data Reviewed: Imaging: Chest x-ray 03/11/2022-no acute cardiopulmonary abnormality I have reviewed the images personally.  PFTs: 07/21/2022 FVC 2.38 [81%], FEV1 2.17 [98%], F/F91, TLC 4.99 [98%], DLCO 17.03 [88%] Normal test  FENO 05/06/2022- 34  Labs: CBC 05/06/2022-WBC 8.3, eos 3.2%, absolute eosinophil count 262 IgE 05/06/2022-43  Assessment:  Asthma Patient reports intermittent raspy voice, possibly related to Breztri  inhaler use. No new respiratory symptoms. -Continue Breztri  inhaler. -Order a spacer for the Breztri  inhaler to improve delivery and reduce oral deposition.  Gastroesophageal Reflux Disease (GERD) Patient reports nocturnal symptoms despite Nexium  BID and Pepcid. History of large hiatal hernia. -Continue current antacid regimen. -Consider follow-up with GI specialist if symptoms persist.  General Health Maintenance / Followup Plans -Schedule follow-up appointment in 1 year, or sooner if any  changes in health status.   Plan/Recommendations: Breztri  with spacer Follow-up for pneumonia  Lonna Coder MD [facility] 02/01/2023, 9:01 AM  CC: Berneta Elsie Sayre,*

## 2023-02-06 ENCOUNTER — Ambulatory Visit: Payer: PPO | Admitting: Family Medicine

## 2023-02-06 ENCOUNTER — Other Ambulatory Visit: Payer: Self-pay | Admitting: Nurse Practitioner

## 2023-02-07 ENCOUNTER — Other Ambulatory Visit: Payer: Self-pay | Admitting: Pulmonary Disease

## 2023-02-07 DIAGNOSIS — J45909 Unspecified asthma, uncomplicated: Secondary | ICD-10-CM

## 2023-02-21 ENCOUNTER — Ambulatory Visit: Payer: PPO | Admitting: Family Medicine

## 2023-02-28 ENCOUNTER — Ambulatory Visit: Payer: PPO | Admitting: Family Medicine

## 2023-02-28 ENCOUNTER — Encounter: Payer: Self-pay | Admitting: Family Medicine

## 2023-02-28 VITALS — BP 114/72 | HR 76 | Temp 97.4°F | Ht 64.0 in | Wt 176.2 lb

## 2023-02-28 DIAGNOSIS — E039 Hypothyroidism, unspecified: Secondary | ICD-10-CM | POA: Diagnosis not present

## 2023-02-28 DIAGNOSIS — G629 Polyneuropathy, unspecified: Secondary | ICD-10-CM | POA: Diagnosis not present

## 2023-02-28 DIAGNOSIS — N1831 Chronic kidney disease, stage 3a: Secondary | ICD-10-CM | POA: Diagnosis not present

## 2023-02-28 DIAGNOSIS — E7800 Pure hypercholesterolemia, unspecified: Secondary | ICD-10-CM

## 2023-02-28 DIAGNOSIS — R209 Unspecified disturbances of skin sensation: Secondary | ICD-10-CM | POA: Diagnosis not present

## 2023-02-28 DIAGNOSIS — M791 Myalgia, unspecified site: Secondary | ICD-10-CM | POA: Diagnosis not present

## 2023-02-28 DIAGNOSIS — E876 Hypokalemia: Secondary | ICD-10-CM

## 2023-02-28 DIAGNOSIS — T466X5A Adverse effect of antihyperlipidemic and antiarteriosclerotic drugs, initial encounter: Secondary | ICD-10-CM | POA: Diagnosis not present

## 2023-02-28 DIAGNOSIS — R7303 Prediabetes: Secondary | ICD-10-CM | POA: Diagnosis not present

## 2023-02-28 LAB — BASIC METABOLIC PANEL
BUN: 16 mg/dL (ref 6–23)
CO2: 27 meq/L (ref 19–32)
Calcium: 9.4 mg/dL (ref 8.4–10.5)
Chloride: 103 meq/L (ref 96–112)
Creatinine, Ser: 0.93 mg/dL (ref 0.40–1.20)
GFR: 61.21 mL/min (ref >60.00)
Glucose, Bld: 94 mg/dL (ref 70–99)
Potassium: 3.9 meq/L (ref 3.5–5.1)
Sodium: 141 meq/L (ref 135–145)

## 2023-02-28 LAB — CBC
HCT: 44.2 % (ref 36.0–46.0)
Hemoglobin: 14.4 g/dL (ref 12.0–15.0)
MCHC: 32.7 g/dL (ref 30.0–36.0)
MCV: 94.4 fL (ref 78.0–100.0)
Platelets: 373 10*3/uL (ref 150.0–400.0)
RBC: 4.68 Mil/uL (ref 3.87–5.11)
RDW: 14.7 % (ref 11.5–15.5)
WBC: 9 10*3/uL (ref 4.0–10.5)

## 2023-02-28 LAB — LIPID PANEL
Cholesterol: 210 mg/dL — ABNORMAL HIGH (ref 0–200)
HDL: 70.8 mg/dL (ref >39.00)
LDL Cholesterol: 105 mg/dL — ABNORMAL HIGH (ref 0–99)
NonHDL: 139.09
Total CHOL/HDL Ratio: 3
Triglycerides: 172 mg/dL — ABNORMAL HIGH (ref 0.0–149.0)
VLDL: 34.4 mg/dL (ref 0.0–40.0)

## 2023-02-28 LAB — URINALYSIS, ROUTINE W REFLEX MICROSCOPIC
Bilirubin Urine: NEGATIVE
Hgb urine dipstick: NEGATIVE
Ketones, ur: NEGATIVE
Leukocytes,Ua: NEGATIVE
Nitrite: NEGATIVE
RBC / HPF: NONE SEEN (ref 0–?)
Specific Gravity, Urine: <=1.005 — AB (ref 1.000–1.030)
Total Protein, Urine: NEGATIVE
Urine Glucose: NEGATIVE
Urobilinogen, UA: 0.2 (ref 0.0–1.0)
pH: 6 (ref 5.0–8.0)

## 2023-02-28 LAB — VITAMIN B12: Vitamin B-12: 902 pg/mL (ref 211–911)

## 2023-02-28 LAB — TSH: TSH: 5.39 u[IU]/mL (ref 0.35–5.50)

## 2023-02-28 LAB — HEMOGLOBIN A1C: Hgb A1c MFr Bld: 6 % (ref 4.6–6.5)

## 2023-02-28 NOTE — Progress Notes (Signed)
 Established Patient Office Visit   Subjective:  Patient ID: Megan Valentine, female    DOB: [birth year]  Age: 73 y.o. MRN: [id number]  Chief Complaint  Patient presents with   Medical Management of Chronic Issues    3 month follow up. Pt complains of very cold hands and feet that is getting worse.     HPI Encounter Diagnoses  Name Primary?   Elevated cholesterol Yes   Prediabetes    Stage 3a chronic kidney disease (HCC)    Hypokalemia    Cold hands and feet    Hypothyroidism, unspecified type    Neuropathy    Myalgia due to statin    For follow-up of above.  Deals with cold hands and feet.  Fingers occasionally take on a bluish color but there is no movement from red to white to blue.  Paresthesias in the balls of both of her feet since her knee surgeries.  B12 levels have been normal and thyroid  is well adjusted.   Review of Systems  Constitutional: Negative.   HENT: Negative.    Eyes:  Negative for blurred vision, discharge and redness.  Respiratory: Negative.    Cardiovascular: Negative.   Gastrointestinal:  Negative for abdominal pain.  Genitourinary: Negative.   Musculoskeletal: Negative.  Negative for myalgias.  Skin:  Negative for rash.  Neurological:  Negative for tingling, loss of consciousness and weakness.  Endo/Heme/Allergies:  Negative for polydipsia.     Current Outpatient Medications:    acetaminophen  (TYLENOL ) 325 MG tablet, Take 2 tablets (650 mg total) by mouth every 6 (six) hours as needed for moderate pain., Disp: 30 tablet, Rfl: 0   albuterol  (VENTOLIN  HFA) 108 (90 Base) MCG/ACT inhaler, INHALE 1 TO 2 PUFFS BY MOUTH EVERY 6 HOURS AS NEEDED FOR WHEEZING OR  SHORTNESS  OF  BREATH., Disp: 9 g, Rfl: 5   aspirin  81 MG chewable tablet, Chew 1 tablet (81 mg total) by mouth 2 (two) times daily., Disp: , Rfl:    Budeson-Glycopyrrol-Formoterol  (BREZTRI  AEROSPHERE) 160-9-4.8 MCG/ACT AERO, Inhale 2 puffs into the lungs in the morning and at bedtime., Disp: 10.7  g, Rfl: 11   Calcium Carbonate Antacid 1177 MG CHEW, Chew 1 tablet by mouth. As needed, Disp: , Rfl:    Cranberry 12600 MG CAPS, Take 1 tablet by mouth. daily, Disp: , Rfl:    diclofenac  Sodium (VOLTAREN  ARTHRITIS PAIN) 1 % GEL, Apply a small grape sized dollop 1 disorder 2 weeks of pends up to 4 times daily as needed, Disp: 150 g, Rfl: 1   docusate sodium  (COLACE) 250 MG capsule, Take 250 mg by mouth daily. Takes 4 daily, Disp: , Rfl:    esomeprazole  (NEXIUM ) 40 MG capsule, TAKE 1 CAPSULE BY MOUTH TWICE DAILY 30  MINUTES  BEFORE  BREAKFAST  AND  DINNER., Disp: 60 capsule, Rfl: 0   estradiol  (ESTRACE ) 0.5 MG tablet, Take 0.5 mg by mouth daily., Disp: , Rfl:    ezetimibe  (ZETIA ) 10 MG tablet, Take 1 tablet (10 mg total) by mouth daily., Disp: 90 tablet, Rfl: 3   famotidine (PEPCID) 20 MG tablet, Take 20 mg by mouth daily., Disp: , Rfl:    fluticasone  (FLONASE ) 50 MCG/ACT nasal spray, Place 1 spray into both nostrils in the morning and at bedtime., Disp: 16 g, Rfl: 0   hydroxypropyl methylcellulose / hypromellose (ISOPTO TEARS / GONIOVISC) 2.5 % ophthalmic solution, Place 1 drop into both eyes 4 (four) times daily as needed for dry eyes., Disp: , Rfl:  ipratropium-albuterol  (DUONEB) 0.5-2.5 (3) MG/3ML SOLN, Take 3 mLs by nebulization every 6 (six) hours as needed., Disp: 360 mL, Rfl: 11   levocetirizine (XYZAL ) 5 MG tablet, TAKE 1 TABLET BY MOUTH ONCE DAILY IN THE EVENING, Disp: 30 tablet, Rfl: 3   levothyroxine  (SYNTHROID ) 112 MCG tablet, TAKE 1 TABLET BY MOUTH ONCE DAILY BEFORE BREAKFAST, Disp: 90 tablet, Rfl: 0   metFORMIN  (GLUCOPHAGE -XR) 500 MG 24 hr tablet, Take 1 tablet (500 mg total) by mouth at bedtime., Disp: 90 tablet, Rfl: 1   potassium chloride  SA (KLOR-CON  M) 20 MEQ tablet, Take 1 tablet (20 mEq total) by mouth daily., Disp: 30 tablet, Rfl: 3   Sennosides 17.2 MG TABS, Take 1 tablet by mouth. 4 capsules daily, Disp: , Rfl:    linaclotide  (LINZESS ) 290 MCG CAPS capsule, Take 1 capsule  (290 mcg total) by mouth daily. Take 30 minutes before breakfast. (Patient not taking: Reported on 02/28/2023), Disp: 30 capsule, Rfl: 1   Objective:     BP 114/72   Pulse 76   Temp (!) 97.4 F (36.3 C)   Ht 5' 4 (1.626 m)   Wt 176 lb 3.2 oz (79.9 kg)   SpO2 98%   BMI 30.24 kg/m    Physical Exam Constitutional:      General: She is not in acute distress.    Appearance: Normal appearance. She is not ill-appearing, toxic-appearing or diaphoretic.  HENT:     Head: Normocephalic and atraumatic.     Right Ear: External ear normal.     Left Ear: External ear normal.  Eyes:     General: No scleral icterus.       Right eye: No discharge.        Left eye: No discharge.     Extraocular Movements: Extraocular movements intact.     Conjunctiva/sclera: Conjunctivae normal.  Cardiovascular:     Rate and Rhythm: Normal rate and regular rhythm.     Pulses:          Radial pulses are 2+ on the right side and 2+ on the left side.       Dorsalis pedis pulses are 2+ on the right side and 2+ on the left side.       Posterior tibial pulses are 2+ on the right side and 2+ on the left side.  Pulmonary:     Effort: Pulmonary effort is normal. No respiratory distress.     Breath sounds: Normal breath sounds. No wheezing, rhonchi or rales.  Abdominal:     General: Bowel sounds are normal.  Musculoskeletal:     Cervical back: No rigidity or tenderness.  Lymphadenopathy:     Cervical: No cervical adenopathy.  Skin:    General: Skin is warm and dry.  Neurological:     Mental Status: She is alert and oriented to person, place, and time.  Psychiatric:        Mood and Affect: Mood normal.        Behavior: Behavior normal.      No results found for any visits on 02/28/23.    The 10-year ASCVD risk score (Arnett DK, et al., 2019) is: 9.4%    Assessment & Plan:   Elevated cholesterol -     Lipid panel  Prediabetes -     Basic metabolic panel -     Urinalysis, Routine w reflex  microscopic -     Hemoglobin A1c  Stage 3a chronic kidney disease (HCC) -  Basic metabolic panel -     CBC  Hypokalemia -     Basic metabolic panel  Cold hands and feet  Hypothyroidism, unspecified type -     TSH  Neuropathy -     Vitamin B12  Myalgia due to statin    Return in about 6 months (around 08/28/2023), or if symptoms worsen or fail to improve.  Cold hands and feet do not sound particularly like Raynaud's phenomena.  Will continue to follow.  Could consider increasing metformin .  Information given on hypokalemia and preventing high cholesterol.  Patient is statin intolerant.  [name], MD

## 2023-03-02 MED ORDER — METFORMIN HCL ER 500 MG PO TB24: 500.0000 mg | ORAL_TABLET | Freq: Two times a day (BID) | ORAL | 3 refills | Status: DC

## 2023-03-02 NOTE — Addendum Note (Signed)
 Addended by: Delene Feinstein on: 03/02/2023 08:14 AM   Modules accepted: Orders

## 2023-03-06 ENCOUNTER — Other Ambulatory Visit: Payer: Self-pay | Admitting: Nurse Practitioner

## 2023-03-21 ENCOUNTER — Encounter: Payer: Self-pay | Admitting: Family Medicine

## 2023-03-26 ENCOUNTER — Other Ambulatory Visit: Payer: Self-pay | Admitting: Family Medicine

## 2023-03-26 DIAGNOSIS — E039 Hypothyroidism, unspecified: Secondary | ICD-10-CM

## 2023-03-29 ENCOUNTER — Other Ambulatory Visit: Payer: Self-pay | Admitting: Family Medicine

## 2023-03-29 DIAGNOSIS — E782 Mixed hyperlipidemia: Secondary | ICD-10-CM

## 2023-03-29 DIAGNOSIS — Z789 Other specified health status: Secondary | ICD-10-CM

## 2023-04-05 ENCOUNTER — Other Ambulatory Visit: Payer: Self-pay | Admitting: Family Medicine

## 2023-04-05 DIAGNOSIS — R7303 Prediabetes: Secondary | ICD-10-CM

## 2023-04-07 ENCOUNTER — Other Ambulatory Visit: Payer: Self-pay

## 2023-04-07 MED ORDER — ESOMEPRAZOLE MAGNESIUM 40 MG PO CPDR: 40.0000 mg | DELAYED_RELEASE_CAPSULE | Freq: Two times a day (BID) | ORAL | 1 refills | Status: DC

## 2023-04-07 NOTE — Telephone Encounter (Signed)
Rx sent to [facility] on [address]

## 2023-04-07 NOTE — Telephone Encounter (Signed)
 DD, if patient's insurance carrier covers the cost of a 3 month supply and she is taking Esomeprazole 40mg  po bid, send in RX for # 180 with 1 additional refill. THX.

## 2023-04-07 NOTE — Telephone Encounter (Signed)
**Note De-identified  [place] Obfuscation** Please advise 

## 2023-05-16 ENCOUNTER — Telehealth: Admitting: Family Medicine

## 2023-05-16 DIAGNOSIS — S40862A Insect bite (nonvenomous) of left upper arm, initial encounter: Secondary | ICD-10-CM

## 2023-05-16 DIAGNOSIS — W57XXXA Bitten or stung by nonvenomous insect and other nonvenomous arthropods, initial encounter: Secondary | ICD-10-CM

## 2023-05-16 MED ORDER — CEPHALEXIN 500 MG PO CAPS: 500.0000 mg | ORAL_CAPSULE | Freq: Three times a day (TID) | ORAL | 0 refills | Status: AC

## 2023-05-16 NOTE — Patient Instructions (Signed)
 Insect Bite, Adult  An insect bite can make your skin red, itchy, and swollen. An insect bite is different from an insect sting, which happens when an insect injects poison (venom) into the skin.  Some insects can spread disease to people through a bite. However, most insect bites do not lead to disease and are not serious.  What are the causes?  Insects may bite for a variety of reasons, including:  Hunger.  To defend themselves.  Insects that bite include:  Spiders.  Mosquitoes and flies.  Ticks and fleas.  Ants.  Kissing bugs.  Chiggers.  What are the signs or symptoms?  In many cases, symptoms last for 2-4 days. However, itching can last up to 10 days. Symptoms include:  Itching or pain in the bite area.  Redness and swelling in the bite area.  An open wound (skin ulcer).  In rare cases, a person may have a severe allergic reaction (anaphylactic reaction) to a bite. Symptoms of an anaphylactic reaction may include:  Feeling warm in the face (flushed). This may include redness.  Itchy, red, swollen areas of skin (hives).  Swelling of the eyes, lips, face, mouth, tongue, or throat.  Wheezing or difficulty breathing, speaking, or swallowing.  Dizziness, light-headedness, or fainting.  Abdominal symptoms like cramping, nausea, vomiting, or diarrhea.  How is this diagnosed?  This condition is usually diagnosed based on symptoms and a physical exam. During the exam, your health care provider will look at the bite and ask you what kind of insect bit you.  How is this treated?  Most insect bites are not serious. Symptoms often go away on their own and treatment is not usually needed. When treatment is recommended, it may include:  Applying ice to the affected area.  Applying steroid or other anti-itch creams, like calamine lotion, to the bite area.  Medicines called antihistamines to reduce itching.  You may also need:  A tetanus shot if you are not up to date.  Antibiotic cream or an oral antibiotic if the bite becomes  infected (this is uncommon).  Follow these instructions at home:  Bite area care    Do not scratch the bite area. It may help to cover the bite area with a bandage or close-fitting clothing.  Keep the bite area clean and dry. Wash it every day with soap and water as told by your health care provider.  Check the bite area every day for signs of infection. Check for:  More redness, swelling, or pain.  Fluid or blood.  Warmth.  Pus or a bad smell.  Managing pain, itching, and swelling    You may apply cortisone cream, calamine lotion, or a paste made of baking soda and water to the bite area as told by your health care provider.  If directed, put ice on the bite area. To do this:  Put ice in a plastic bag.  Place a towel between your skin and the bag.  Leave the ice on for 20 minutes, 2-3 times a day.  If your skin turns bright red, remove the ice right away to prevent skin damage. The risk of skin damage is higher if you cannot feel pain, heat, or cold.  General instructions  Apply or take over-the-counter and prescription medicine only as told by your health care provider.  If you were prescribed antibiotics, take or apply them as told by your health care provider. Do not stop using the antibiotic even if you start to feel  better.  How is this prevented?  To help reduce your risk of insect bites:  When you are outdoors, wear clothing that covers your arms and legs. This is especially important in the early morning and evening.  Use insect repellent. The best insect repellents contain DEET, picaridin, oil of lemon eucalyptus (OLE), or IR3535.  Consider spraying your clothing with a pesticide called permethrin. Permethrin helps prevent insect bites. It works for several weeks and for up to 5-6 clothing washes. Do not apply permethrin directly to the skin.  If your home windows do not have screens, consider installing them.  If you will be sleeping in an area where there are mosquitoes, consider covering your sleeping  area with a mosquito net.  Contact a health care provider if:  Your bite area has signs of infection, such as:  More redness, swelling, or pain.  Fluid or blood.  Warmth.  Pus or a bad smell.  You have a fever.  Get help right away if:  You have a rash.  You have muscle or joint pain.  You feel unusually tired or weak.  You have neck pain or a headache.  You develop symptoms of an anaphylactic reaction. These may include:  Swelling of the eyes, lips, face, mouth, tongue, or throat.  Flushed skin or hives.  Wheezing.  Difficulty breathing, speaking, or swallowing.  Dizziness, light-headedness, or fainting.  Abdominal pain, cramping, vomiting, or diarrhea.  These symptoms may be an emergency. Get help right away. Call 911.  Do not wait to see if the symptoms will go away.  Do not drive yourself to the hospital.  Summary  An insect bite can make your skin red, itchy, and swollen.  Treatment is usually not needed. Symptoms often go away on their own. When treatment is recommended, it may involve taking medicine, applying medicine to the area, or applying ice.  Apply or take over-the-counter and prescription medicines only as told by your health care provider.  Use insect repellent to help prevent insect bites.  Contact a health care provider if your bite area has signs of infection.  This information is not intended to replace advice given to you by your health care provider. Make sure you discuss any questions you have with your health care provider.  Document Revised: 04/21/2021 Document Reviewed: 04/06/2021  Elsevier Patient Education  2024 [employer].

## 2023-05-16 NOTE — Progress Notes (Signed)
 Virtual Visit Consent   Megan Valentine, you are scheduled for a virtual visit with a [facility] provider today. Just as with appointments in the office, your consent must be obtained to participate. Your consent will be active for this visit and any virtual visit you may have with one of our providers in the next 365 days. If you have a MyChart account, a copy of this consent can be sent to you electronically.  As this is a virtual visit, video technology does not allow for your provider to perform a traditional examination. This may limit your provider's ability to fully assess your condition. If your provider identifies any concerns that need to be evaluated in person or the need to arrange testing (such as labs, EKG, etc.), we will make arrangements to do so. Although advances in technology are sophisticated, we cannot ensure that it will always work on either your end or our end. If the connection with a video visit is poor, the visit may have to be switched to a telephone visit. With either a video or telephone visit, we are not always able to ensure that we have a secure connection.  By engaging in this virtual visit, you consent to the provision of healthcare and authorize for your insurance to be billed (if applicable) for the services provided during this visit. Depending on your insurance coverage, you may receive a charge related to this service.  I need to obtain your verbal consent now. Are you willing to proceed with your visit today? Megan Valentine has provided verbal consent on 05/16/2023 for a virtual visit (video or telephone). Megan Huger, FNP  Date: 05/16/2023 5:09 PM   Virtual Visit via Video Note   I, Megan Valentine, connected with  Megan Valentine  ([id number], 21-Jun-1950) on 05/16/23 at  5:00 PM EDT by a video-enabled telemedicine application and verified that I am speaking with the correct person using two identifiers.  Location: Patient: Virtual Visit Location Patient:  Home Provider: Virtual Visit Location Provider: Home Office   I discussed the limitations of evaluation and management by telemedicine and the availability of in person appointments. The patient expressed understanding and agreed to proceed.    History of Present Illness: Megan Valentine is a 73 y.o. who identifies as a female who was assigned female at birth, and is being seen today for an insect bit her on left upper  arm while she was working outside Saturday. It has become more and more red, hot and painful. It appears it bit her 3 times. Aaron Aas  HPI: HPI  Problems:  Patient Active Problem List   Diagnosis Date Noted   Elevated cholesterol 07/29/2022   Prediabetes 07/29/2022   DOE (dyspnea on exertion) 06/10/2022   Left hip pain 02/21/2022   Myalgia due to statin 01/04/2022   Arthritis of carpometacarpal [facility]) joint of both thumbs 09/07/2021   Carpal tunnel syndrome of right wrist 09/06/2021   Primary osteoarthritis of right hand 09/06/2021   Neuropathy 05/13/2021   Venous insufficiency 05/13/2021   Low back pain 04/05/2021   Acquired leg length discrepancy 04/05/2021   Urinary urgency 01/14/2021   Acute cystitis without hematuria 01/14/2021   Macrocytosis 12/07/2020   S/P TKR (total knee replacement) using cement, left 11/17/2020   Chronic pain of right knee 09/30/2020   Medication side effect 09/30/2020   Statin intolerance 06/30/2020   Trigger point of shoulder region, left 02/25/2020   B12 deficiency 08/15/2019   Elevated glucose 08/15/2019  Anemia 02/15/2019   Abdominal pain 12/14/2018   Sinus pressure 11/12/2018   History of 2019 novel coronavirus disease (COVID-19) 11/12/2018   Pleurisy 06/05/2018   Enterocele 06/05/2018   Rectocele 05/23/2018   Vaginal vault prolapse 05/23/2018   SUI (stress urinary incontinence, female) 05/23/2018   Asthma 05/07/2018   Stage 3a chronic kidney disease (HCC) 01/15/2018   Edema 12/15/2017   Insomnia 11/20/2017   Seborrheic  dermatitis 11/16/2017   Seasonal allergic rhinitis due to pollen 11/16/2017   Menopausal and female climacteric states 10/19/2017   Constipation 03/26/2015   Unilateral primary osteoarthritis, left knee 11/17/2014   Healthcare maintenance 04/25/2014   Cold hands and feet 09/15/2011   Hypokalemia 09/15/2011   Hypothyroidism 08/21/2008   Mixed hyperlipidemia 08/21/2008    Allergies:  Allergies  Allergen Reactions   Tetracycline Hives and Itching   Dilaudid  [Hydromorphone  Hcl] Nausea And Vomiting   Other     Cigarette smoke and perfumes--flares asthma (develops bronchitis)   Medications:  Current Outpatient Medications:    cephALEXin  (KEFLEX ) 500 MG capsule, Take 1 capsule (500 mg total) by mouth 3 (three) times daily for 7 days., Disp: 21 capsule, Rfl: 0   acetaminophen  (TYLENOL ) 325 MG tablet, Take 2 tablets (650 mg total) by mouth every 6 (six) hours as needed for moderate pain., Disp: 30 tablet, Rfl: 0   albuterol  (VENTOLIN  HFA) 108 (90 Base) MCG/ACT inhaler, INHALE 1 TO 2 PUFFS BY MOUTH EVERY 6 HOURS AS NEEDED FOR WHEEZING OR  SHORTNESS  OF  BREATH., Disp: 9 g, Rfl: 5   aspirin  81 MG chewable tablet, Chew 1 tablet (81 mg total) by mouth 2 (two) times daily., Disp: , Rfl:    Budeson-Glycopyrrol-Formoterol  (BREZTRI  AEROSPHERE) 160-9-4.8 MCG/ACT AERO, Inhale 2 puffs into the lungs in the morning and at bedtime., Disp: 10.7 g, Rfl: 11   Calcium Carbonate Antacid 1177 MG CHEW, Chew 1 tablet by mouth. As needed, Disp: , Rfl:    Cranberry 12600 MG CAPS, Take 1 tablet by mouth. daily, Disp: , Rfl:    diclofenac  Sodium (VOLTAREN  ARTHRITIS PAIN) 1 % GEL, Apply a small grape sized dollop 1 disorder 2 weeks of pends up to 4 times daily as needed, Disp: 150 g, Rfl: 1   docusate sodium  (COLACE) 250 MG capsule, Take 250 mg by mouth daily. Takes 4 daily, Disp: , Rfl:    esomeprazole  (NEXIUM ) 40 MG capsule, Take 1 capsule (40 mg total) by mouth 2 (two) times daily., Disp: 180 capsule, Rfl: 1    estradiol  (ESTRACE ) 0.5 MG tablet, Take 0.5 mg by mouth daily., Disp: , Rfl:    ezetimibe  (ZETIA ) 10 MG tablet, Take 1 tablet by mouth once daily, Disp: 90 tablet, Rfl: 0   famotidine (PEPCID) 20 MG tablet, Take 20 mg by mouth daily., Disp: , Rfl:    fluticasone  (FLONASE ) 50 MCG/ACT nasal spray, Place 1 spray into both nostrils in the morning and at bedtime., Disp: 16 g, Rfl: 0   hydroxypropyl methylcellulose / hypromellose (ISOPTO TEARS / GONIOVISC) 2.5 % ophthalmic solution, Place 1 drop into both eyes 4 (four) times daily as needed for dry eyes., Disp: , Rfl:    ipratropium-albuterol  (DUONEB) 0.5-2.5 (3) MG/3ML SOLN, Take 3 mLs by nebulization every 6 (six) hours as needed., Disp: 360 mL, Rfl: 11   levocetirizine (XYZAL ) 5 MG tablet, TAKE 1 TABLET BY MOUTH ONCE DAILY IN THE EVENING, Disp: 30 tablet, Rfl: 3   levothyroxine  (SYNTHROID ) 112 MCG tablet, TAKE 1 TABLET BY MOUTH ONCE DAILY  BEFORE BREAKFAST, Disp: 100 tablet, Rfl: 0   linaclotide  (LINZESS ) 290 MCG CAPS capsule, Take 1 capsule (290 mcg total) by mouth daily. Take 30 minutes before breakfast. (Patient not taking: Reported on 02/28/2023), Disp: 30 capsule, Rfl: 1   metFORMIN  (GLUCOPHAGE -XR) 500 MG 24 hr tablet, Take 1 tablet (500 mg total) by mouth 2 (two) times daily with a meal., Disp: 180 tablet, Rfl: 3   potassium chloride  SA (KLOR-CON  M) 20 MEQ tablet, Take 1 tablet (20 mEq total) by mouth daily., Disp: 30 tablet, Rfl: 3   Sennosides 17.2 MG TABS, Take 1 tablet by mouth. 4 capsules daily, Disp: , Rfl:   Observations/Objective: Patient is well-developed, well-nourished in no acute distress.  Resting comfortably  at home.  Head is normocephalic, atraumatic.  No labored breathing.  Speech is clear and coherent with logical content.  Patient is alert and oriented at baseline.    Assessment and Plan: 1. Insect bite of left upper arm, initial encounter (Primary)  Cool compresses, tylenol , UC if sx worsen.  Follow Up Instructions: I  discussed the assessment and treatment plan with the patient. The patient was provided an opportunity to ask questions and all were answered. The patient agreed with the plan and demonstrated an understanding of the instructions.  A copy of instructions were sent to the patient via MyChart unless otherwise noted below.     The patient was advised to call back or seek an in-person evaluation if the symptoms worsen or if the condition fails to improve as anticipated.    Janeth Terry, FNP

## 2023-05-30 ENCOUNTER — Other Ambulatory Visit: Payer: Self-pay | Admitting: Nurse Practitioner

## 2023-06-02 DIAGNOSIS — H40022 Open angle with borderline findings, high risk, left eye: Secondary | ICD-10-CM | POA: Diagnosis not present

## 2023-06-02 DIAGNOSIS — H40052 Ocular hypertension, left eye: Secondary | ICD-10-CM | POA: Diagnosis not present

## 2023-06-02 DIAGNOSIS — H40011 Open angle with borderline findings, low risk, right eye: Secondary | ICD-10-CM | POA: Diagnosis not present

## 2023-06-14 ENCOUNTER — Telehealth: Admitting: Physician Assistant

## 2023-06-14 DIAGNOSIS — J069 Acute upper respiratory infection, unspecified: Secondary | ICD-10-CM | POA: Diagnosis not present

## 2023-06-14 DIAGNOSIS — B9689 Other specified bacterial agents as the cause of diseases classified elsewhere: Secondary | ICD-10-CM | POA: Diagnosis not present

## 2023-06-14 MED ORDER — AMOXICILLIN-POT CLAVULANATE 875-125 MG PO TABS
1.0000 | ORAL_TABLET | Freq: Two times a day (BID) | ORAL | 0 refills | Status: DC
Start: 2023-06-14 — End: 2023-07-21

## 2023-06-14 MED ORDER — BENZONATATE 200 MG PO CAPS
200.0000 mg | ORAL_CAPSULE | Freq: Three times a day (TID) | ORAL | 0 refills | Status: DC | PRN
Start: 2023-06-14 — End: 2023-07-21

## 2023-06-14 NOTE — Patient Instructions (Signed)
 Megan Valentine, thank you for joining Angelia Kelp, PA-C for today's virtual visit.  While this provider is not your primary care provider (PCP), if your PCP is located in our provider database this encounter information will be shared with them immediately following your visit.   A [facility] MyChart account gives you access to today's visit and all your visits, tests, and labs performed at [facility] " click here if you don't have a [facility] MyChart account or go to [email or web address]  Consent: (Patient) Megan Valentine provided verbal consent for this virtual visit at the beginning of the encounter.  Current Medications:  Current Outpatient Medications:    amoxicillin -clavulanate (AUGMENTIN ) 875-125 MG tablet, Take 1 tablet by mouth 2 (two) times daily., Disp: 20 tablet, Rfl: 0   benzonatate  (TESSALON ) 200 MG capsule, Take 1 capsule (200 mg total) by mouth 3 (three) times daily as needed for cough., Disp: 20 capsule, Rfl: 0   acetaminophen  (TYLENOL ) 325 MG tablet, Take 2 tablets (650 mg total) by mouth every 6 (six) hours as needed for moderate pain., Disp: 30 tablet, Rfl: 0   albuterol  (VENTOLIN  HFA) 108 (90 Base) MCG/ACT inhaler, INHALE 1 TO 2 PUFFS BY MOUTH EVERY 6 HOURS AS NEEDED FOR WHEEZING OR  SHORTNESS  OF  BREATH., Disp: 9 g, Rfl: 5   aspirin  81 MG chewable tablet, Chew 1 tablet (81 mg total) by mouth 2 (two) times daily., Disp: , Rfl:    Budeson-Glycopyrrol-Formoterol  (BREZTRI  AEROSPHERE) 160-9-4.8 MCG/ACT AERO, Inhale 2 puffs into the lungs in the morning and at bedtime., Disp: 10.7 g, Rfl: 11   Calcium Carbonate Antacid 1177 MG CHEW, Chew 1 tablet by mouth. As needed, Disp: , Rfl:    Cranberry 12600 MG CAPS, Take 1 tablet by mouth. daily, Disp: , Rfl:    diclofenac  Sodium (VOLTAREN  ARTHRITIS PAIN) 1 % GEL, Apply a small grape sized dollop 1 disorder 2 weeks of pends up to 4 times daily as needed, Disp: 150 g, Rfl: 1   docusate sodium  (COLACE) 250 MG  capsule, Take 250 mg by mouth daily. Takes 4 daily, Disp: , Rfl:    esomeprazole  (NEXIUM ) 40 MG capsule, TAKE 1 CAPSULE BY MOUTH TWICE DAILY 30  MINUTES  BEFORE  BREAKFAST  AND  DINNER., Disp: 60 capsule, Rfl: 2   estradiol  (ESTRACE ) 0.5 MG tablet, Take 0.5 mg by mouth daily., Disp: , Rfl:    ezetimibe  (ZETIA ) 10 MG tablet, Take 1 tablet by mouth once daily, Disp: 90 tablet, Rfl: 0   famotidine (PEPCID) 20 MG tablet, Take 20 mg by mouth daily., Disp: , Rfl:    fluticasone  (FLONASE ) 50 MCG/ACT nasal spray, Place 1 spray into both nostrils in the morning and at bedtime., Disp: 16 g, Rfl: 0   hydroxypropyl methylcellulose / hypromellose (ISOPTO TEARS / GONIOVISC) 2.5 % ophthalmic solution, Place 1 drop into both eyes 4 (four) times daily as needed for dry eyes., Disp: , Rfl:    ipratropium-albuterol  (DUONEB) 0.5-2.5 (3) MG/3ML SOLN, Take 3 mLs by nebulization every 6 (six) hours as needed., Disp: 360 mL, Rfl: 11   levocetirizine (XYZAL ) 5 MG tablet, TAKE 1 TABLET BY MOUTH ONCE DAILY IN THE EVENING, Disp: 30 tablet, Rfl: 3   levothyroxine  (SYNTHROID ) 112 MCG tablet, TAKE 1 TABLET BY MOUTH ONCE DAILY BEFORE BREAKFAST, Disp: 100 tablet, Rfl: 0   linaclotide  (LINZESS ) 290 MCG CAPS capsule, Take 1 capsule (290 mcg total) by mouth daily. Take 30 minutes before breakfast. (Patient not taking:  Reported on 02/28/2023), Disp: 30 capsule, Rfl: 1   metFORMIN  (GLUCOPHAGE -XR) 500 MG 24 hr tablet, Take 1 tablet (500 mg total) by mouth 2 (two) times daily with a meal., Disp: 180 tablet, Rfl: 3   potassium chloride  SA (KLOR-CON  M) 20 MEQ tablet, Take 1 tablet (20 mEq total) by mouth daily., Disp: 30 tablet, Rfl: 3   Sennosides 17.2 MG TABS, Take 1 tablet by mouth. 4 capsules daily, Disp: , Rfl:    Medications ordered in this encounter:  Meds ordered this encounter  Medications   amoxicillin -clavulanate (AUGMENTIN ) 875-125 MG tablet    Sig: Take 1 tablet by mouth 2 (two) times daily.    Dispense:  20 tablet    Refill:   0    Supervising Provider:   LAMPTEY, PHILIP O [[id number]]   benzonatate  (TESSALON ) 200 MG capsule    Sig: Take 1 capsule (200 mg total) by mouth 3 (three) times daily as needed for cough.    Dispense:  20 capsule    Refill:  0    Supervising Provider:   Corine Dice [[id number]]     *If you need refills on other medications prior to your next appointment, please contact your pharmacy*  Follow-Up: Call back or seek an in-person evaluation if the symptoms worsen or if the condition fails to improve as anticipated.   Virtual Care [phone]  Other Instructions Upper Respiratory Infection, Adult An upper respiratory infection (URI) is a common viral infection of the nose, throat, and upper air passages that lead to the lungs. The most common type of URI is the common cold. URIs usually get better on their own, without medical treatment. What are the causes? A URI is caused by a virus. You may catch a virus by: Breathing in droplets from an infected person's cough or sneeze. Touching something that has been exposed to the virus (is contaminated) and then touching your mouth, nose, or eyes. What increases the risk? You are more likely to get a URI if: You are very young or very old. You have close contact with others, such as at work, school, or a health care facility. You smoke. You have long-term (chronic) heart or lung disease. You have a weakened disease-fighting system (immune system). You have nasal allergies or asthma. You are experiencing a lot of stress. You have poor nutrition. What are the signs or symptoms? A URI usually involves some of the following symptoms: Runny or stuffy (congested) nose. Cough. Sneezing. Sore throat. Headache. Fatigue. Fever. Loss of appetite. Pain in your forehead, behind your eyes, and over your cheekbones (sinus pain). Muscle aches. Redness or irritation of the eyes. Pressure in the ears or face. How is this  diagnosed? This condition may be diagnosed based on your medical history and symptoms, and a physical exam. Your health care provider may use a swab to take a mucus sample from your nose (nasal swab). This sample can be tested to determine what virus is causing the illness. How is this treated? URIs usually get better on their own within 7-10 days. Medicines cannot cure URIs, but your health care provider may recommend certain medicines to help relieve symptoms, such as: Over-the-counter cold medicines. Cough suppressants. Coughing is a type of defense against infection that helps to clear the respiratory system, so take these medicines only as recommended by your health care provider. Fever-reducing medicines. Follow these instructions at home: Activity Rest as needed. If you have a fever, stay home from work or  school until your fever is gone or until your health care provider says your URI cannot spread to other people (is no longer contagious). Your health care provider may have you wear a face mask to prevent your infection from spreading. Relieving symptoms Gargle with a mixture of salt and water 3-4 times a day or as needed. To make salt water, completely dissolve -1 tsp (3-6 g) of salt in 1 cup (237 mL) of warm water. Use a cool-mist humidifier to add moisture to the air. This can help you breathe more easily. Eating and drinking  Drink enough fluid to keep your urine pale yellow. Eat soups and other clear broths. General instructions  Take over-the-counter and prescription medicines only as told by your health care provider. These include cold medicines, fever reducers, and cough suppressants. Do not use any products that contain nicotine or tobacco. These products include cigarettes, chewing tobacco, and vaping devices, such as e-cigarettes. If you need help quitting, ask your health care provider. Stay away from secondhand smoke. Stay up to date on all immunizations, including the  yearly (annual) flu vaccine. Keep all follow-up visits. This is important. How to prevent the spread of infection to others URIs can be contagious. To prevent the infection from spreading: Wash your hands with soap and water for at least 20 seconds. If soap and water are not available, use hand sanitizer. Avoid touching your mouth, face, eyes, or nose. Cough or sneeze into a tissue or your sleeve or elbow instead of into your hand or into the air.  Contact a health care provider if: You are getting worse instead of better. You have a fever or chills. Your mucus is brown or red. You have yellow or brown discharge coming from your nose. You have pain in your face, especially when you bend forward. You have swollen neck glands. You have pain while swallowing. You have white areas in the back of your throat. Get help right away if: You have shortness of breath that gets worse. You have severe or persistent: Headache. Ear pain. Sinus pain. Chest pain. You have chronic lung disease along with any of the following: Making high-pitched whistling sounds when you breathe, most often when you breathe out (wheezing). Prolonged cough (more than 14 days). Coughing up blood. A change in your usual mucus. You have a stiff neck. You have changes in your: Vision. Hearing. Thinking. Mood. These symptoms may be an emergency. Get help right away. Call 911. Do not wait to see if the symptoms will go away. Do not drive yourself to the hospital. Summary An upper respiratory infection (URI) is a common infection of the nose, throat, and upper air passages that lead to the lungs. A URI is caused by a virus. URIs usually get better on their own within 7-10 days. Medicines cannot cure URIs, but your health care provider may recommend certain medicines to help relieve symptoms. This information is not intended to replace advice given to you by your health care provider. Make sure you discuss any  questions you have with your health care provider. Document Revised: 08/12/2020 Document Reviewed: 08/12/2020 Elsevier Patient Education  2024 [employer]   If you have been instructed to have an in-person evaluation today at a local Urgent Care facility, please use the link below. It will take you to a list of all of our available [facility], including address, phone number and hours of operation. Please do not delay care.   Urgent Cares  If you or a family member do not have a primary care provider, use the link below to schedule a visit and establish care. When you choose a [facility] primary care physician or advanced practice provider, you gain a long-term partner in health. Find a Primary Care Provider  Learn more about [facility]'s in-office and virtual care options: [facility] - Get Care Now

## 2023-06-14 NOTE — Progress Notes (Signed)
 Virtual Visit Consent   Megan Valentine, you are scheduled for a virtual visit with a [facility] provider today. Just as with appointments in the office, your consent must be obtained to participate. Your consent will be active for this visit and any virtual visit you may have with one of our providers in the next 365 days. If you have a MyChart account, a copy of this consent can be sent to you electronically.  As this is a virtual visit, video technology does not allow for your provider to perform a traditional examination. This may limit your provider's ability to fully assess your condition. If your provider identifies any concerns that need to be evaluated in person or the need to arrange testing (such as labs, EKG, etc.), we will make arrangements to do so. Although advances in technology are sophisticated, we cannot ensure that it will always work on either your end or our end. If the connection with a video visit is poor, the visit may have to be switched to a telephone visit. With either a video or telephone visit, we are not always able to ensure that we have a secure connection.  By engaging in this virtual visit, you consent to the provision of healthcare and authorize for your insurance to be billed (if applicable) for the services provided during this visit. Depending on your insurance coverage, you may receive a charge related to this service.  I need to obtain your verbal consent now. Are you willing to proceed with your visit today? Megan Valentine has provided verbal consent on 06/14/2023 for a virtual visit (video or telephone). Angelia Kelp, PA-C  Date: 06/14/2023 8:19 AM   Virtual Visit via Video Note   I, Angelia Kelp, connected with  Megan Valentine  ([id number], 1950-12-10) on 06/14/23 at  8:15 AM EDT by a video-enabled telemedicine application and verified that I am speaking with the correct person using two identifiers.  Location: Patient: Virtual Visit Location  Patient: Home Provider: Virtual Visit Location Provider: Home Office   I discussed the limitations of evaluation and management by telemedicine and the availability of in person appointments. The patient expressed understanding and agreed to proceed.    History of Present Illness: Megan Valentine is a 73 y.o. who identifies as a female who was assigned female at birth, and is being seen today for cough and congestion.  HPI: URI  This is a new problem. The current episode started 1 to 4 weeks ago. The problem has been gradually worsening. There has been no fever. Associated symptoms include chest pain (tightness), congestion, coughing (productive), ear pain (right), headaches, rhinorrhea, sinus pain and a sore throat (scratchy, started to hurt last night). Pertinent negatives include no plugged ear sensation. She has tried inhaler use (Allegra, Breztri , Hycodan) for the symptoms.     Problems:  Patient Active Problem List   Diagnosis Date Noted   Elevated cholesterol 07/29/2022   Prediabetes 07/29/2022   DOE (dyspnea on exertion) 06/10/2022   Left hip pain 02/21/2022   Myalgia due to statin 01/04/2022   Arthritis of carpometacarpal [facility]) joint of both thumbs 09/07/2021   Carpal tunnel syndrome of right wrist 09/06/2021   Primary osteoarthritis of right hand 09/06/2021   Neuropathy 05/13/2021   Venous insufficiency 05/13/2021   Low back pain 04/05/2021   Acquired leg length discrepancy 04/05/2021   Urinary urgency 01/14/2021   Acute cystitis without hematuria 01/14/2021   Macrocytosis 12/07/2020   S/P TKR (total  knee replacement) using cement, left 11/17/2020   Chronic pain of right knee 09/30/2020   Medication side effect 09/30/2020   Statin intolerance 06/30/2020   Trigger point of shoulder region, left 02/25/2020   B12 deficiency 08/15/2019   Elevated glucose 08/15/2019   Anemia 02/15/2019   Abdominal pain 12/14/2018   Sinus pressure 11/12/2018   History of 2019 novel  coronavirus disease (COVID-19) 11/12/2018   Pleurisy 06/05/2018   Enterocele 06/05/2018   Rectocele 05/23/2018   Vaginal vault prolapse 05/23/2018   SUI (stress urinary incontinence, female) 05/23/2018   Asthma 05/07/2018   Stage 3a chronic kidney disease (HCC) 01/15/2018   Edema 12/15/2017   Insomnia 11/20/2017   Seborrheic dermatitis 11/16/2017   Seasonal allergic rhinitis due to pollen 11/16/2017   Menopausal and female climacteric states 10/19/2017   Constipation 03/26/2015   Unilateral primary osteoarthritis, left knee 11/17/2014   Healthcare maintenance 04/25/2014   Cold hands and feet 09/15/2011   Hypokalemia 09/15/2011   Hypothyroidism 08/21/2008   Mixed hyperlipidemia 08/21/2008    Allergies:  Allergies  Allergen Reactions   Tetracycline Hives and Itching   Dilaudid  [Hydromorphone  Hcl] Nausea And Vomiting   Other     Cigarette smoke and perfumes--flares asthma (develops bronchitis)   Medications:  Current Outpatient Medications:    amoxicillin -clavulanate (AUGMENTIN ) 875-125 MG tablet, Take 1 tablet by mouth 2 (two) times daily., Disp: 20 tablet, Rfl: 0   benzonatate  (TESSALON ) 200 MG capsule, Take 1 capsule (200 mg total) by mouth 3 (three) times daily as needed for cough., Disp: 20 capsule, Rfl: 0   acetaminophen  (TYLENOL ) 325 MG tablet, Take 2 tablets (650 mg total) by mouth every 6 (six) hours as needed for moderate pain., Disp: 30 tablet, Rfl: 0   albuterol  (VENTOLIN  HFA) 108 (90 Base) MCG/ACT inhaler, INHALE 1 TO 2 PUFFS BY MOUTH EVERY 6 HOURS AS NEEDED FOR WHEEZING OR  SHORTNESS  OF  BREATH., Disp: 9 g, Rfl: 5   aspirin  81 MG chewable tablet, Chew 1 tablet (81 mg total) by mouth 2 (two) times daily., Disp: , Rfl:    Budeson-Glycopyrrol-Formoterol  (BREZTRI  AEROSPHERE) 160-9-4.8 MCG/ACT AERO, Inhale 2 puffs into the lungs in the morning and at bedtime., Disp: 10.7 g, Rfl: 11   Calcium Carbonate Antacid 1177 MG CHEW, Chew 1 tablet by mouth. As needed, Disp: , Rfl:     Cranberry 12600 MG CAPS, Take 1 tablet by mouth. daily, Disp: , Rfl:    diclofenac  Sodium (VOLTAREN  ARTHRITIS PAIN) 1 % GEL, Apply a small grape sized dollop 1 disorder 2 weeks of pends up to 4 times daily as needed, Disp: 150 g, Rfl: 1   docusate sodium  (COLACE) 250 MG capsule, Take 250 mg by mouth daily. Takes 4 daily, Disp: , Rfl:    esomeprazole  (NEXIUM ) 40 MG capsule, TAKE 1 CAPSULE BY MOUTH TWICE DAILY 30  MINUTES  BEFORE  BREAKFAST  AND  DINNER., Disp: 60 capsule, Rfl: 2   estradiol  (ESTRACE ) 0.5 MG tablet, Take 0.5 mg by mouth daily., Disp: , Rfl:    ezetimibe  (ZETIA ) 10 MG tablet, Take 1 tablet by mouth once daily, Disp: 90 tablet, Rfl: 0   famotidine (PEPCID) 20 MG tablet, Take 20 mg by mouth daily., Disp: , Rfl:    fluticasone  (FLONASE ) 50 MCG/ACT nasal spray, Place 1 spray into both nostrils in the morning and at bedtime., Disp: 16 g, Rfl: 0   hydroxypropyl methylcellulose / hypromellose (ISOPTO TEARS / GONIOVISC) 2.5 % ophthalmic solution, Place 1 drop into both  eyes 4 (four) times daily as needed for dry eyes., Disp: , Rfl:    ipratropium-albuterol  (DUONEB) 0.5-2.5 (3) MG/3ML SOLN, Take 3 mLs by nebulization every 6 (six) hours as needed., Disp: 360 mL, Rfl: 11   levocetirizine (XYZAL ) 5 MG tablet, TAKE 1 TABLET BY MOUTH ONCE DAILY IN THE EVENING, Disp: 30 tablet, Rfl: 3   levothyroxine  (SYNTHROID ) 112 MCG tablet, TAKE 1 TABLET BY MOUTH ONCE DAILY BEFORE BREAKFAST, Disp: 100 tablet, Rfl: 0   linaclotide  (LINZESS ) 290 MCG CAPS capsule, Take 1 capsule (290 mcg total) by mouth daily. Take 30 minutes before breakfast. (Patient not taking: Reported on 02/28/2023), Disp: 30 capsule, Rfl: 1   metFORMIN  (GLUCOPHAGE -XR) 500 MG 24 hr tablet, Take 1 tablet (500 mg total) by mouth 2 (two) times daily with a meal., Disp: 180 tablet, Rfl: 3   potassium chloride  SA (KLOR-CON  M) 20 MEQ tablet, Take 1 tablet (20 mEq total) by mouth daily., Disp: 30 tablet, Rfl: 3   Sennosides 17.2 MG TABS, Take 1 tablet  by mouth. 4 capsules daily, Disp: , Rfl:   Observations/Objective: Patient is well-developed, well-nourished in no acute distress.  Resting comfortably at home.  Head is normocephalic, atraumatic.  No labored breathing.  Speech is clear and coherent with logical content.  Patient is alert and oriented at baseline.    Assessment and Plan: 1. Bacterial upper respiratory infection (Primary) - amoxicillin -clavulanate (AUGMENTIN ) 875-125 MG tablet; Take 1 tablet by mouth 2 (two) times daily.  Dispense: 20 tablet; Refill: 0 - benzonatate  (TESSALON ) 200 MG capsule; Take 1 capsule (200 mg total) by mouth 3 (three) times daily as needed for cough.  Dispense: 20 capsule; Refill: 0  - Worsening symptoms that have not responded to OTC medications.  - Will give Augmentin  - Tessalon  perles for cough - Continue allergy medications.  - Steam and humidifier can help - Stay well hydrated and get plenty of rest.  - Seek in person evaluation if no symptom improvement or if symptoms worsen   Follow Up Instructions: I discussed the assessment and treatment plan with the patient. The patient was provided an opportunity to ask questions and all were answered. The patient agreed with the plan and demonstrated an understanding of the instructions.  A copy of instructions were sent to the patient via MyChart unless otherwise noted below.    The patient was advised to call back or seek an in-person evaluation if the symptoms worsen or if the condition fails to improve as anticipated.    Angelia Kelp, PA-C

## 2023-06-27 ENCOUNTER — Other Ambulatory Visit: Payer: Self-pay | Admitting: Family Medicine

## 2023-06-27 DIAGNOSIS — Z789 Other specified health status: Secondary | ICD-10-CM

## 2023-06-27 DIAGNOSIS — E782 Mixed hyperlipidemia: Secondary | ICD-10-CM

## 2023-06-29 ENCOUNTER — Other Ambulatory Visit: Payer: Self-pay

## 2023-06-29 ENCOUNTER — Encounter (HOSPITAL_COMMUNITY): Payer: Self-pay | Admitting: *Deleted

## 2023-06-29 ENCOUNTER — Ambulatory Visit (HOSPITAL_COMMUNITY)
Admission: EM | Admit: 2023-06-29 | Discharge: 2023-06-29 | Disposition: A | Discharge status: Home / Self Care | Attending: Emergency Medicine | Admitting: Emergency Medicine

## 2023-06-29 DIAGNOSIS — H9203 Otalgia, bilateral: Secondary | ICD-10-CM

## 2023-06-29 DIAGNOSIS — K1379 Other lesions of oral mucosa: Secondary | ICD-10-CM

## 2023-06-29 MED ORDER — LEVOCETIRIZINE DIHYDROCHLORIDE 5 MG PO TABS: 5.0000 mg | ORAL_TABLET | Freq: Every evening | ORAL | 0 refills | Status: DC

## 2023-06-29 NOTE — ED Triage Notes (Signed)
 Reports having virtual visit 3 wks ago for URI -- was given amox x10d, and felt better.  States she noticed "one spot" of left anteror neck tenderness onset approx 3 wks ago; this AM noticed gum tenderness to left lower mouth & feeling like left anterolateral neck is swelling. Decreased appetite over 3 wks. Denies fevers.  Reports tick bite 5/16.

## 2023-06-29 NOTE — Discharge Instructions (Addendum)
 Stop Claritin  and start taking Xyzal  every night Follow-up with your PCP You do not have an ear infection at this time keep an eye on this if you have a fever greater than 101.1 you may need to see your PCP for antibiotics Follow-up with your dentist does not show infection to the gumline you does have some redness and irritation

## 2023-06-29 NOTE — ED Provider Notes (Signed)
 [facility]    CSN: [id number] Arrival date & time: 06/29/23  0802      History   Chief Complaint Chief Complaint  Patient presents with   Neck Pain    HPI Megan Valentine is a 73 y.o. female.   Patient presents today with tenderness around jawline/abdominal on.  She does have seasonal allergies and takes a Claritin  every day but still feels fullness in her ears.  Patient states that she was on antibiotics 3 weeks ago for upper respiratory and has healed since.  Denies any fevers no cough no nausea vomiting no diarrhea.    Past Medical History:  Diagnosis Date   Allergy    Arthritis    Asthma    Cataract    Chronic bronchitis (HCC)    Chronic kidney disease    stage 3, kidney infections   Complication of anesthesia    hard to wake up   Diverticulosis    Environmental allergies    GERD (gastroesophageal reflux disease)    Hiatal hernia    History of chicken pox    Hyperlipidemia    Hypothyroidism    IBS (irritable bowel syndrome)    Migraines    Mitral valve prolapse    Per pt . per Dr.Henry  [name].  does not have MVP   Neuromuscular disorder (HCC)    neuropathy feet   Pneumonia    Rectal prolapse     Patient Active Problem List   Diagnosis Date Noted   Elevated cholesterol 07/29/2022   Prediabetes 07/29/2022   DOE (dyspnea on exertion) 06/10/2022   Left hip pain 02/21/2022   Myalgia due to statin 01/04/2022   Arthritis of carpometacarpal ([facility]) joint of both thumbs 09/07/2021   Carpal tunnel syndrome of right wrist 09/06/2021   Primary osteoarthritis of right hand 09/06/2021   Neuropathy 05/13/2021   Venous insufficiency 05/13/2021   Low back pain 04/05/2021   Acquired leg length discrepancy 04/05/2021   Urinary urgency 01/14/2021   Acute cystitis without hematuria 01/14/2021   Macrocytosis 12/07/2020   S/P TKR (total knee replacement) using cement, left 11/17/2020   Chronic pain of right knee 09/30/2020   Medication side effect  09/30/2020   Statin intolerance 06/30/2020   Trigger point of shoulder region, left 02/25/2020   B12 deficiency 08/15/2019   Elevated glucose 08/15/2019   Anemia 02/15/2019   Abdominal pain 12/14/2018   Sinus pressure 11/12/2018   History of 2019 novel coronavirus disease (COVID-19) 11/12/2018   Pleurisy 06/05/2018   Enterocele 06/05/2018   Rectocele 05/23/2018   Vaginal vault prolapse 05/23/2018   SUI (stress urinary incontinence, female) 05/23/2018   Asthma 05/07/2018   Stage 3a chronic kidney disease (HCC) 01/15/2018   Edema 12/15/2017   Insomnia 11/20/2017   Seborrheic dermatitis 11/16/2017   Seasonal allergic rhinitis due to pollen 11/16/2017   Menopausal and female climacteric states 10/19/2017   Constipation 03/26/2015   Unilateral primary osteoarthritis, left knee 11/17/2014   Healthcare maintenance 04/25/2014   Cold hands and feet 09/15/2011   Hypokalemia 09/15/2011   Hypothyroidism 08/21/2008   Mixed hyperlipidemia 08/21/2008    Past Surgical History:  Procedure Laterality Date   ABDOMINAL HYSTERECTOMY  1999   APPENDECTOMY  1962   BREAST CYST ASPIRATION Bilateral    CHOLECYSTECTOMY N/A 12/17/2018   Procedure: LAPAROSCOPIC CHOLECYSTECTOMY;  Surgeon: Adalberto Acton, MD;  Location: [facility] OR;  Service: General;  Laterality: N/A;   COLONOSCOPY     EYE SURGERY  cataract   JOINT REPLACEMENT     rt knee scope   REFRACTIVE SURGERY  2000   both eyes   retrocele repair     and bladder sling   TOTAL KNEE ARTHROPLASTY Right 03/24/2015   Procedure: TOTAL KNEE ARTHROPLASTY;  Surgeon: Shirlee Dotter, MD;  Location: [facility] OR;  Service: Orthopedics;  Laterality: Right;   TOTAL KNEE ARTHROPLASTY Left 11/17/2020   Procedure: LEFT TOTAL KNEE ARTHROPLASTY;  Surgeon: Shirlee Dotter, MD;  Location: [facility] ORS;  Service: Orthopedics;  Laterality: Left;   TUBAL LIGATION  1977   UPPER GASTROINTESTINAL ENDOSCOPY      OB History   No obstetric history on file.      Home  Medications    Prior to Admission medications   Medication Sig Start Date End Date Taking? Authorizing Provider  aspirin  81 MG chewable tablet Chew 1 tablet (81 mg total) by mouth 2 (two) times daily. 11/19/20  Yes Petrarca, Aimee Alf, PA-C  Budeson-Glycopyrrol-Formoterol  (BREZTRI  AEROSPHERE) 160-9-4.8 MCG/ACT AERO Inhale 2 puffs into the lungs in the morning and at bedtime. 09/30/22  Yes Mannam, Praveen, MD  Cranberry 12600 MG CAPS Take 1 tablet by mouth. daily   Yes [provider]  docusate sodium  (COLACE) 250 MG capsule Take 250 mg by mouth daily. Takes 4 daily   Yes [provider]  esomeprazole  (NEXIUM ) 40 MG capsule TAKE 1 CAPSULE BY MOUTH TWICE DAILY 30  MINUTES  BEFORE  BREAKFAST  AND  DINNER. 05/30/23  Yes Kennedy-Smith, Colleen M, NP  estradiol  (ESTRACE ) 0.5 MG tablet Take 0.5 mg by mouth daily.   Yes [provider]  ezetimibe  (ZETIA ) 10 MG tablet Take 1 tablet by mouth once daily 06/27/23  Yes Tonna Frederic, MD  famotidine (PEPCID) 20 MG tablet Take 20 mg by mouth daily.   Yes [provider]  levocetirizine (XYZAL ) 5 MG tablet TAKE 1 TABLET BY MOUTH ONCE DAILY IN THE EVENING 07/29/22  Yes Tonna Frederic, MD  levocetirizine (XYZAL ) 5 MG tablet Take 1 tablet (5 mg total) by mouth every evening. 06/29/23  Yes Harden Leyden, NP  levothyroxine  (SYNTHROID ) 112 MCG tablet TAKE 1 TABLET BY MOUTH ONCE DAILY BEFORE BREAKFAST 03/27/23  Yes Tonna Frederic, MD  metFORMIN  (GLUCOPHAGE -XR) 500 MG 24 hr tablet Take 1 tablet (500 mg total) by mouth 2 (two) times daily with a meal. 03/02/23  Yes Tonna Frederic, MD  acetaminophen  (TYLENOL ) 325 MG tablet Take 2 tablets (650 mg total) by mouth every 6 (six) hours as needed for moderate pain. 03/03/21   Adolph Hoop, PA-C  albuterol  (VENTOLIN  HFA) 108 (90 Base) MCG/ACT inhaler INHALE 1 TO 2 PUFFS BY MOUTH EVERY 6 HOURS AS NEEDED FOR WHEEZING OR  SHORTNESS  OF  BREATH. 02/17/23   Mannam, Praveen, MD   amoxicillin -clavulanate (AUGMENTIN ) 875-125 MG tablet Take 1 tablet by mouth 2 (two) times daily. 06/14/23   Angelia Kelp, PA-C  benzonatate  (TESSALON ) 200 MG capsule Take 1 capsule (200 mg total) by mouth 3 (three) times daily as needed for cough. 06/14/23   Burnette, Jennifer M, PA-C  Calcium Carbonate Antacid 1177 MG CHEW Chew 1 tablet by mouth. As needed    [provider]  diclofenac  Sodium (VOLTAREN  ARTHRITIS PAIN) 1 % GEL Apply a small grape sized dollop 1 disorder 2 weeks of pends up to 4 times daily as needed 09/06/21   Tonna Frederic, MD  fluticasone  (FLONASE ) 50 MCG/ACT nasal spray Place 1 spray into  both nostrils in the morning and at bedtime. 02/17/22   Crain, Whitney L, PA  hydroxypropyl methylcellulose / hypromellose (ISOPTO TEARS / GONIOVISC) 2.5 % ophthalmic solution Place 1 drop into both eyes 4 (four) times daily as needed for dry eyes.    [provider]  ipratropium-albuterol  (DUONEB) 0.5-2.5 (3) MG/3ML SOLN Take 3 mLs by nebulization every 6 (six) hours as needed. 08/03/22   Mannam, Praveen, MD  linaclotide  (LINZESS ) 290 MCG CAPS capsule Take 1 capsule (290 mcg total) by mouth daily. Take 30 minutes before breakfast. Patient not taking: Reported on 02/28/2023 11/18/22 11/13/23  Kennedy-Smith, Colleen M, NP  potassium chloride  SA (KLOR-CON  M) 20 MEQ tablet Take 1 tablet (20 mEq total) by mouth daily. 11/01/22   Tonna Frederic, MD  Sennosides 17.2 MG TABS Take 1 tablet by mouth. 4 capsules daily    [provider]    Family History Family History  Problem Relation Age of Onset   Stomach cancer Maternal Grandmother    Hypertension Maternal Grandmother    Diabetes Maternal Grandmother    Stroke Maternal Grandmother    Cancer Maternal Grandmother        Stomach cancer   Colon cancer Paternal Grandfather    Heart attack Paternal Grandfather    Hyperlipidemia Mother    Diabetes Mother    Hypertension Mother    Alzheimer's disease  Mother    Diabetes Father    Hyperlipidemia Father    Heart disease Father    Heart attack Father    Stroke Paternal Grandmother     Social History Social History   Tobacco Use   Smoking status: Former    Current packs/day: 0.00    Types: Cigarettes    Quit date: 03/23/1976    Years since quitting: 47.2   Smokeless tobacco: Never  Vaping Use   Vaping status: Never Used  Substance Use Topics   Alcohol  use: No   Drug use: No     Allergies   Tetracycline, Dilaudid  [hydromorphone  hcl], and Other   Review of Systems Review of Systems  Constitutional:  Negative for chills, fatigue and fever.  HENT:  Positive for rhinorrhea. Negative for mouth sores, sinus pressure, sinus pain and sneezing.        Tenderness to the left sided gumline while brushing this morning does have dental work completed on that side in the past  Eyes: Negative.   Respiratory: Negative.    Cardiovascular: Negative.   Genitourinary: Negative.   Neurological: Negative.      Physical Exam Triage Vital Signs ED Triage Vitals  Encounter Vitals Group     BP 06/29/23 0823 136/81     Systolic BP Percentile --      Diastolic BP Percentile --      Pulse Rate 06/29/23 0823 77     Resp 06/29/23 0823 16     Temp 06/29/23 0823 97.8 F (36.6 C)     Temp Source 06/29/23 0823 Oral     SpO2 06/29/23 0823 96 %     Weight --      Height --      Head Circumference --      Peak Flow --      Pain Score 06/29/23 0824 8     Pain Loc --      Pain Education --      Exclude from Growth Chart --    No data found.  Updated Vital Signs BP 136/81   Pulse 77  Temp 97.8 F (36.6 C) (Oral)   Resp 16   SpO2 96%   Visual Acuity Right Eye Distance:   Left Eye Distance:   Bilateral Distance:    Right Eye Near:   Left Eye Near:    Bilateral Near:     Physical Exam Constitutional:      Appearance: Normal appearance.  HENT:     Right Ear: Tympanic membrane is bulging. Tympanic membrane is not erythematous.      Left Ear: Tenderness present. A middle ear effusion is present. Tympanic membrane is bulging. Tympanic membrane is not erythematous.     Nose: No congestion or rhinorrhea.  Eyes:     Pupils: Pupils are equal, round, and reactive to light.  Cardiovascular:     Rate and Rhythm: Normal rate.  Pulmonary:     Effort: Pulmonary effort is normal.  Abdominal:     General: Abdomen is flat.  Skin:    Comments: Has very minimal erythema pinpoint area to left flank area from a tick bite approximately 3 weeks ago.  Patient has been applying cortisol   Neurological:     General: No focal deficit present.     Mental Status: She is alert.      UC Treatments / Results  Labs (all labs ordered are listed, but only abnormal results are displayed) Labs Reviewed - No data to display  EKG   Radiology No results found.  Procedures Procedures (including critical care time)  Medications Ordered in UC Medications - No data to display  Initial Impression / Assessment and Plan / UC Course  I have reviewed the triage vital signs and the nursing notes.  Pertinent labs & imaging results that were available during my care of the patient were reviewed by me and considered in my medical decision making (see chart for details).     Patient was able to call dentist while in office he is going to see her today Patient is to stop Claritin  and start taking Xyzal  daily Patient begins to have a fever greater than 101.1 she needs to follow-up with her PCP No signs of infection at this time no antibiotic is needed Continue to take cortisol cream to the tick bite  Final Clinical Impressions(s) / UC Diagnoses   Final diagnoses:  Ear pain, bilateral  Mouth pain     Discharge Instructions      Stop Claritin  and start taking Xyzal  every night Follow-up with your PCP You do not have an ear infection at this time keep an eye on this if you have a fever greater than 101.1 you may need to see your PCP  for antibiotics Follow-up with your dentist does not show infection to the gumline you does have some redness and irritation   ED Prescriptions     Medication Sig Dispense Auth. Provider   levocetirizine (XYZAL ) 5 MG tablet Take 1 tablet (5 mg total) by mouth every evening. 30 tablet Harden Leyden, NP      PDMP not reviewed this encounter.   Harden Leyden, NP 06/29/23 [phone]

## 2023-07-01 ENCOUNTER — Other Ambulatory Visit: Payer: Self-pay | Admitting: Family Medicine

## 2023-07-01 DIAGNOSIS — E039 Hypothyroidism, unspecified: Secondary | ICD-10-CM

## 2023-07-16 ENCOUNTER — Encounter: Payer: Self-pay | Admitting: Family Medicine

## 2023-07-16 DIAGNOSIS — R0989 Other specified symptoms and signs involving the circulatory and respiratory systems: Secondary | ICD-10-CM

## 2023-07-16 DIAGNOSIS — J301 Allergic rhinitis due to pollen: Secondary | ICD-10-CM

## 2023-07-16 DIAGNOSIS — H6991 Unspecified Eustachian tube disorder, right ear: Secondary | ICD-10-CM

## 2023-07-17 MED ORDER — LEVOCETIRIZINE DIHYDROCHLORIDE 5 MG PO TABS: 5.0000 mg | ORAL_TABLET | Freq: Every evening | ORAL | 3 refills | Status: AC

## 2023-07-20 NOTE — Progress Notes (Signed)
 Ellouise Console, PA-C [address] [place], [place]  [address] Phone: [phone]   Primary Care Physician: Berneta Elsie Sayre, MD  Primary Gastroenterologist:  Ellouise Console, PA-C / Glendia Holt, MD   Chief Complaint: Chronic Constipation; IBS-C      HPI:   Megan Valentine is a 73 y.o. female self-referred to evaluate bowel blockage.  Patient has irritable bowel syndrome, chronic constipation, and GERD.  Currently taking Linzess  290 mcg daily, Colace stool softener daily, Nexium  40 Mg daily, famotidine 20 Mg daily.  Current symptoms: She tried Linzess  290 which did not help.  She is currently taking 4 stool softeners daily and 4 OTC vegetable laxative daily.  Still having a lot of constipation.  Reports having chronic constipation all of her life since childhood.  In the past 2 weeks she drank 2 bottles of magnesium  citrate which helped.  She has increased right lower quadrant pain when she is constipated and is relieved after bowel movement.  Remote history of appendectomy as a child.  She denies nausea, vomiting, rectal bleeding, or alarm symptoms.  PMH: arthritis, asthma, migraine headaches, CKD stage III, GERD, diverticulosis and colon polyps. Past appendectomy age 1, hysterectomy 1999, cholecystectomy 11/2018, rectocele repair 08/2018   EGD and colonoscopy 11/30/2021. The EGD showed a 3 cm hiatal hernia otherwise was normal. The colonoscopy identified 8 tubular adenomatous polyps removed from the colon, diverticulosis, melanosis coli and nonbleeding internal hemorrhoids. She was advised to repeat a colonoscopy in 3 years (due 11/2024).   Current Outpatient Medications  Medication Sig Dispense Refill   acetaminophen  (TYLENOL ) 325 MG tablet Take 2 tablets (650 mg total) by mouth every 6 (six) hours as needed for moderate pain. 30 tablet 0   albuterol  (VENTOLIN  HFA) 108 (90 Base) MCG/ACT inhaler INHALE 1 TO 2 PUFFS BY MOUTH EVERY 6 HOURS AS NEEDED FOR WHEEZING OR  SHORTNESS   OF  BREATH. 9 g 5   aspirin  81 MG chewable tablet Chew 1 tablet (81 mg total) by mouth 2 (two) times daily.     Budeson-Glycopyrrol-Formoterol  (BREZTRI  AEROSPHERE) 160-9-4.8 MCG/ACT AERO Inhale 2 puffs into the lungs in the morning and at bedtime. 10.7 g 11   Cranberry 12600 MG CAPS Take 1 tablet by mouth. daily     diclofenac  Sodium (VOLTAREN  ARTHRITIS PAIN) 1 % GEL Apply a small grape sized dollop 1 disorder 2 weeks of pends up to 4 times daily as needed 150 g 1   docusate sodium  (COLACE) 250 MG capsule Take 250 mg by mouth daily. Takes 4 daily     esomeprazole  (NEXIUM ) 40 MG capsule TAKE 1 CAPSULE BY MOUTH TWICE DAILY 30  MINUTES  BEFORE  BREAKFAST  AND  DINNER. 60 capsule 2   estradiol  (ESTRACE ) 0.5 MG tablet Take 0.5 mg by mouth daily.     ezetimibe  (ZETIA ) 10 MG tablet Take 1 tablet by mouth once daily 90 tablet 3   famotidine (PEPCID) 20 MG tablet Take 20 mg by mouth daily.     hydroxypropyl methylcellulose / hypromellose (ISOPTO TEARS / GONIOVISC) 2.5 % ophthalmic solution Place 1 drop into both eyes 4 (four) times daily as needed for dry eyes.     ipratropium-albuterol  (DUONEB) 0.5-2.5 (3) MG/3ML SOLN Take 3 mLs by nebulization every 6 (six) hours as needed. 360 mL 11   levocetirizine (XYZAL ) 5 MG tablet Take 1 tablet (5 mg total) by mouth every evening. 30 tablet 3   levothyroxine  (SYNTHROID ) 112 MCG tablet TAKE 1  TABLET BY MOUTH ONCE DAILY BEFORE BREAKFAST 100 tablet 0   lubiprostone (AMITIZA) 24 MCG capsule Take 1 capsule (24 mcg total) by mouth 2 (two) times daily with a meal. 60 capsule 5   metFORMIN  (GLUCOPHAGE -XR) 500 MG 24 hr tablet Take 1 tablet (500 mg total) by mouth 2 (two) times daily with a meal. 180 tablet 3   Sennosides 17.2 MG TABS Take 1 tablet by mouth. 4 capsules daily     amoxicillin -clavulanate (AUGMENTIN ) 875-125 MG tablet Take 1 tablet by mouth 2 (two) times daily. 20 tablet 0   benzonatate  (TESSALON ) 200 MG capsule Take 1 capsule (200 mg total) by mouth 3 (three)  times daily as needed for cough. 20 capsule 0   Calcium Carbonate Antacid 1177 MG CHEW Chew 1 tablet by mouth. As needed     No current facility-administered medications for this visit.    Allergies as of 07/21/2023 - Review Complete 07/21/2023  Allergen Reaction Noted   Tetracycline Hives and Itching    Dilaudid  [hydromorphone  hcl] Nausea And Vomiting 12/14/2018   Other  07/11/2019    Past Medical History:  Diagnosis Date   Allergy    Arthritis    Asthma    Cataract    Chronic bronchitis (HCC)    Chronic kidney disease    stage 3, kidney infections   Complication of anesthesia    hard to wake up   Diverticulosis    Environmental allergies    GERD (gastroesophageal reflux disease)    Hiatal hernia    History of chicken pox    Hyperlipidemia    Hypothyroidism    IBS (irritable bowel syndrome)    Migraines    Mitral valve prolapse    Per pt . per Dr.Henry  [name].  does not have MVP   Neuromuscular disorder (HCC)    neuropathy feet   Pneumonia    Rectal prolapse     Past Surgical History:  Procedure Laterality Date   ABDOMINAL HYSTERECTOMY  1999   APPENDECTOMY  1962   BREAST CYST ASPIRATION Bilateral    CHOLECYSTECTOMY N/A 12/17/2018   Procedure: LAPAROSCOPIC CHOLECYSTECTOMY;  Surgeon: Signe Mitzie LABOR, MD;  Location: [facility] OR;  Service: General;  Laterality: N/A;   COLONOSCOPY     EYE SURGERY     cataract   JOINT REPLACEMENT     rt knee scope   REFRACTIVE SURGERY  2000   both eyes   retrocele repair     and bladder sling   TOTAL KNEE ARTHROPLASTY Right 03/24/2015   Procedure: TOTAL KNEE ARTHROPLASTY;  Surgeon: Maude LELON Right, MD;  Location: [facility] OR;  Service: Orthopedics;  Laterality: Right;   TOTAL KNEE ARTHROPLASTY Left 11/17/2020   Procedure: LEFT TOTAL KNEE ARTHROPLASTY;  Surgeon: Right Maude LELON, MD;  Location: [facility];  Service: Orthopedics;  Laterality: Left;   TUBAL LIGATION  1977   UPPER GASTROINTESTINAL ENDOSCOPY      Review of Systems:     All systems reviewed and negative except where noted in HPI.    Physical Exam:  BP 118/68   Pulse 68   Ht 5' 4 (1.626 m)   Wt 161 lb (73 kg)   BMI 27.64 kg/m  No LMP recorded. Patient has had a hysterectomy.  General: Well-nourished, well-developed in no acute distress.  Lungs: Clear to auscultation bilaterally. Non-labored. Heart: Regular rate and rhythm, no murmurs rubs or gallops.  Abdomen: Bowel sounds are normal; Abdomen is Soft; No hepatosplenomegaly, masses or hernias;  No  Abdominal Tenderness; No guarding or rebound tenderness. Neuro: Alert and oriented x 3.  Grossly intact.  Psych: Alert and cooperative, normal mood and affect.  Imaging Studies: No results found.  Labs: CBC    Component Value Date/Time   WBC 9.0 02/28/2023 0946   RBC 4.68 02/28/2023 0946   HGB 14.4 02/28/2023 0946   HCT 44.2 02/28/2023 0946   PLT 373.0 02/28/2023 0946   MCV 94.4 02/28/2023 0946   MCV 98.2 (A) 10/11/2012 2010   MCH 32.5 11/17/2020 1253   MCHC 32.7 02/28/2023 0946   RDW 14.7 02/28/2023 0946   LYMPHSABS 1.8 10/19/2022 1555   MONOABS 0.8 10/19/2022 1555   EOSABS 0.3 10/19/2022 1555   BASOSABS 0.1 10/19/2022 1555    CMP     Component Value Date/Time   NA 141 02/28/2023 0946   NA 143 06/16/2022 1031   K 3.9 02/28/2023 0946   CL 103 02/28/2023 0946   CO2 27 02/28/2023 0946   GLUCOSE 94 02/28/2023 0946   BUN 16 02/28/2023 0946   BUN 15 06/16/2022 1031   CREATININE 0.93 02/28/2023 0946   CREATININE 0.93 01/01/2019 1505   CALCIUM 9.4 02/28/2023 0946   CALCIUM 9.5 09/16/2011 0844   PROT 6.8 10/19/2022 1555   ALBUMIN 4.0 10/19/2022 1555   AST 18 10/19/2022 1555   ALT 18 10/19/2022 1555   ALKPHOS 85 10/19/2022 1555   BILITOT 0.3 10/19/2022 1555   GFRNONAA >60 11/20/2020 0308   GFRAA >60 12/17/2018 0213    Assessment and Plan:   Megan Valentine is a 73 y.o. y/o female presents for:  Chronic Idiopathic Constipation - Lifelong since childhood. Irritable Bowel  Syndrome, Constipation predominant  Plan: - Stop Linzess  290 mcg. - Start Amitiza (Lubiprostone) 24mcg 1 capsule twice daily with food, #60, 5 RF. -Also OK to continue stool softener and OTC pain vegetable laxative if needed. - Recommend High Fiber diet with fruits, vegetables, and whole grains. - Drink 64 ounces of Fluids Daily.  - If Amitiza does not work, then try [employer] or [place].  Ellouise Console, PA-C  Follow up in 3 months with TG.

## 2023-07-21 ENCOUNTER — Encounter: Payer: Self-pay | Admitting: Physician Assistant

## 2023-07-21 ENCOUNTER — Ambulatory Visit: Admitting: Physician Assistant

## 2023-07-21 VITALS — BP 118/68 | HR 68 | Ht 64.0 in | Wt 161.0 lb

## 2023-07-21 DIAGNOSIS — K5904 Chronic idiopathic constipation: Secondary | ICD-10-CM

## 2023-07-21 DIAGNOSIS — K581 Irritable bowel syndrome with constipation: Secondary | ICD-10-CM

## 2023-07-21 MED ORDER — LUBIPROSTONE 24 MCG PO CAPS
24.0000 ug | ORAL_CAPSULE | Freq: Two times a day (BID) | ORAL | 5 refills | Status: DC
Start: 2023-07-21 — End: 2023-10-20

## 2023-07-21 NOTE — Patient Instructions (Addendum)
 We have sent the following medications to your pharmacy for you to pick up at your convenience: Amitiza 24 mcg twice daily with meals  Please follow up sooner if symptoms increase or worsen  Due to recent changes in healthcare laws, you may see the results of your imaging and laboratory studies on MyChart before your provider has had a chance to review them.  We understand that in some cases there may be results that are confusing or concerning to you. Not all laboratory results come back in the same time frame and the provider may be waiting for multiple results in order to interpret others.  Please give us  48 hours in order for your provider to thoroughly review all the results before contacting the office for clarification of your results.   Thank you for trusting me with your gastrointestinal care!   Ellouise Console, PA-C _______________________________________________________  If your blood pressure at your visit was 140/90 or greater, please contact your primary care physician to follow up on this.  _______________________________________________________  If you are age 67 or older, your body mass index should be between 23-30. Your Body mass index is 27.64 kg/m. If this is out of the aforementioned range listed, please consider follow up with your Primary Care Provider.  If you are age 36 or younger, your body mass index should be between 19-25. Your Body mass index is 27.64 kg/m. If this is out of the aformentioned range listed, please consider follow up with your Primary Care Provider.   ________________________________________________________  The [facility] providers would like to encourage you to use MYCHART to communicate with providers for non-urgent requests or questions.  Due to long hold times on the telephone, sending your provider a message by [facility] may be a faster and more efficient way to get a response.  Please allow 48 business hours for a response.  Please remember that  this is for non-urgent requests.  _______________________________________________________

## 2023-07-25 NOTE — Progress Notes (Signed)
 Agree with the assessment and plan as outlined by Ellouise Console, PA-C.  Would also consider pelvic floor physical therapy or anorectal manometry if having any straining issues, given the lack of response to laxatives.

## 2023-08-16 ENCOUNTER — Ambulatory Visit: Admitting: Podiatry

## 2023-08-16 ENCOUNTER — Encounter: Payer: Self-pay | Admitting: Podiatry

## 2023-08-16 ENCOUNTER — Ambulatory Visit (INDEPENDENT_AMBULATORY_CARE_PROVIDER_SITE_OTHER)

## 2023-08-16 DIAGNOSIS — M722 Plantar fascial fibromatosis: Secondary | ICD-10-CM

## 2023-08-16 DIAGNOSIS — M7671 Peroneal tendinitis, right leg: Secondary | ICD-10-CM

## 2023-08-16 MED ORDER — TRIAMCINOLONE ACETONIDE 10 MG/ML IJ SUSP
10.0000 mg | Freq: Once | INTRAMUSCULAR | Status: AC
Start: 2023-08-16 — End: 2023-08-16
  Administered 2023-08-16: 10 mg via INTRA_ARTICULAR

## 2023-08-16 NOTE — Progress Notes (Signed)
 Subjective:   Patient ID: Megan Valentine, female   DOB: 73 y.o.   MRN: [id number]   HPI Patient states she has developed a lot of pain in the back of her right heel and the bottom of her right heel more on the outside.  Does not remember injury and it has been a while since she has been treated.   ROS      Objective:  Physical Exam  Neurovascular status intact with exquisite discomfort plantar aspect right heel lateral side with fluid buildup and moderate discomfort around the peroneal tendon right lateral Achilles     Assessment:  Acute plantar fasciitis right lateral side along with peroneal tendinitis which may be compensation     Plan:  H&P reviewed sterile prep injected the fascia lateral side 3 mg Dexasone Kenalog  5 mg Xylocaine  advised on stretching exercises and possible treatment for other area depending on response  X-rays did not indicate spur formation no indication stress fracture or arthritis

## 2023-08-27 ENCOUNTER — Other Ambulatory Visit: Payer: Self-pay | Admitting: Nurse Practitioner

## 2023-08-29 ENCOUNTER — Encounter: Payer: Self-pay | Admitting: Family Medicine

## 2023-08-29 ENCOUNTER — Ambulatory Visit: Payer: Self-pay | Admitting: Family Medicine

## 2023-08-29 ENCOUNTER — Ambulatory Visit (INDEPENDENT_AMBULATORY_CARE_PROVIDER_SITE_OTHER): Payer: PPO | Admitting: Family Medicine

## 2023-08-29 ENCOUNTER — Telehealth: Payer: Self-pay

## 2023-08-29 VITALS — BP 102/72 | HR 80 | Temp 97.0°F | Ht 64.0 in | Wt 153.0 lb

## 2023-08-29 DIAGNOSIS — E876 Hypokalemia: Secondary | ICD-10-CM

## 2023-08-29 DIAGNOSIS — Z789 Other specified health status: Secondary | ICD-10-CM | POA: Diagnosis not present

## 2023-08-29 DIAGNOSIS — N1831 Chronic kidney disease, stage 3a: Secondary | ICD-10-CM | POA: Diagnosis not present

## 2023-08-29 DIAGNOSIS — R7303 Prediabetes: Secondary | ICD-10-CM | POA: Diagnosis not present

## 2023-08-29 LAB — BASIC METABOLIC PANEL WITH GFR
BUN: 12 mg/dL (ref 6–23)
CO2: 28 meq/L (ref 19–32)
Calcium: 9.1 mg/dL (ref 8.4–10.5)
Chloride: 102 meq/L (ref 96–112)
Creatinine, Ser: 0.92 mg/dL (ref 0.40–1.20)
GFR: 61.8 mL/min (ref >60.00)
Glucose, Bld: 101 mg/dL — ABNORMAL HIGH (ref 70–99)
Potassium: 2.6 meq/L — CL (ref 3.5–5.1)
Sodium: 141 meq/L (ref 135–145)

## 2023-08-29 LAB — HEMOGLOBIN A1C: Hgb A1c MFr Bld: 6 % (ref 4.6–6.5)

## 2023-08-29 MED ORDER — POTASSIUM CHLORIDE CRYS ER 20 MEQ PO TBCR
EXTENDED_RELEASE_TABLET | ORAL | 0 refills | Status: DC
Start: 2023-08-29 — End: 2023-08-29

## 2023-08-29 MED ORDER — POTASSIUM CHLORIDE CRYS ER 20 MEQ PO TBCR: EXTENDED_RELEASE_TABLET | ORAL | 0 refills | Status: DC

## 2023-08-29 NOTE — Addendum Note (Signed)
 Addended by: BERNETA ELSIE LABOR on: 08/29/2023 03:33 PM   Modules accepted: Orders

## 2023-08-29 NOTE — Telephone Encounter (Signed)
 Spoke with patient. Results and instructions given. Pt expresses understanding. Pt states she has potassium tablets from when Dr. Berneta Dc'd. Advised that I am faxing the script for Potassium. Confirmed [facility] on [address]. Appt made for 2 week FU on potassium. No further questions or concerns.

## 2023-08-29 NOTE — Telephone Encounter (Signed)
 CRITICAL VALUE STICKER  CRITICAL VALUE: K 2.6  RECEIVER (on-site recipient of call): Elleigh Cassetta  DATE & TIME NOTIFIED: 08/29/23 @ 1516  MESSENGER (representative from lab): Pleasant  MD NOTIFIED: Berneta  TIME OF NOTIFICATION: 1516  RESPONSE:  Provider aware

## 2023-08-29 NOTE — Addendum Note (Signed)
 Addended by: BERNETA ELSIE LABOR on: 08/29/2023 03:34 PM   Modules accepted: Orders

## 2023-08-29 NOTE — Progress Notes (Signed)
 Established Patient Office Visit   Subjective:  Patient ID: Megan Valentine, female    DOB: 1950/06/20  Age: 73 y.o. MRN: [id number]  Chief Complaint  Patient presents with   Medical Management of Chronic Issues    6 month follow up.     HPI Encounter Diagnoses  Name Primary?   Prediabetes Yes   Stage 3a chronic kidney disease (HCC)    Hypokalemia    Statin intolerance    For follow-up of above.  Has been able to lose 8 pounds since the last visit.  She has improved her dietary intake.  Not exercising much.  She believes that the metformin  has helped her lose some weight.  She is remaining well-hydrated to help with her constipation that is associated with IBS and CKD.  Continues with Zetia  for elevated cholesterol.  She is statin intolerant   Review of Systems  Constitutional: Negative.   HENT: Negative.    Eyes:  Negative for blurred vision, discharge and redness.  Respiratory: Negative.    Cardiovascular: Negative.   Gastrointestinal:  Negative for abdominal pain.  Genitourinary: Negative.   Musculoskeletal: Negative.  Negative for myalgias.  Skin:  Negative for rash.  Neurological:  Negative for tingling, loss of consciousness and weakness.  Endo/Heme/Allergies:  Negative for polydipsia.     Current Outpatient Medications:    acetaminophen  (TYLENOL ) 325 MG tablet, Take 2 tablets (650 mg total) by mouth every 6 (six) hours as needed for moderate pain., Disp: 30 tablet, Rfl: 0   albuterol  (VENTOLIN  HFA) 108 (90 Base) MCG/ACT inhaler, INHALE 1 TO 2 PUFFS BY MOUTH EVERY 6 HOURS AS NEEDED FOR WHEEZING OR  SHORTNESS  OF  BREATH., Disp: 9 g, Rfl: 5   aspirin  81 MG chewable tablet, Chew 1 tablet (81 mg total) by mouth 2 (two) times daily., Disp: , Rfl:    Budeson-Glycopyrrol-Formoterol  (BREZTRI  AEROSPHERE) 160-9-4.8 MCG/ACT AERO, Inhale 2 puffs into the lungs in the morning and at bedtime., Disp: 10.7 g, Rfl: 11   Cranberry 12600 MG CAPS, Take 1 tablet by mouth. daily, Disp: ,  Rfl:    diclofenac  Sodium (VOLTAREN  ARTHRITIS PAIN) 1 % GEL, Apply a small grape sized dollop 1 disorder 2 weeks of pends up to 4 times daily as needed, Disp: 150 g, Rfl: 1   docusate sodium  (COLACE) 250 MG capsule, Take 250 mg by mouth daily. Takes 4 daily, Disp: , Rfl:    esomeprazole  (NEXIUM ) 40 MG capsule, TAKE 1 CAPSULE BY MOUTH TWICE DAILY 30  MINUTES  BEFORE  BREAKFAST  AND  DINNER., Disp: 60 capsule, Rfl: 2   estradiol  (ESTRACE ) 0.5 MG tablet, Take 0.5 mg by mouth daily., Disp: , Rfl:    ezetimibe  (ZETIA ) 10 MG tablet, Take 1 tablet by mouth once daily, Disp: 90 tablet, Rfl: 3   famotidine (PEPCID) 20 MG tablet, Take 20 mg by mouth daily., Disp: , Rfl:    hydroxypropyl methylcellulose / hypromellose (ISOPTO TEARS / GONIOVISC) 2.5 % ophthalmic solution, Place 1 drop into both eyes 4 (four) times daily as needed for dry eyes., Disp: , Rfl:    ipratropium-albuterol  (DUONEB) 0.5-2.5 (3) MG/3ML SOLN, Take 3 mLs by nebulization every 6 (six) hours as needed., Disp: 360 mL, Rfl: 11   levocetirizine (XYZAL ) 5 MG tablet, Take 1 tablet (5 mg total) by mouth every evening., Disp: 30 tablet, Rfl: 3   levothyroxine  (SYNTHROID ) 112 MCG tablet, TAKE 1 TABLET BY MOUTH ONCE DAILY BEFORE BREAKFAST, Disp: 100 tablet, Rfl: 0  lubiprostone  (AMITIZA ) 24 MCG capsule, Take 1 capsule (24 mcg total) by mouth 2 (two) times daily with a meal., Disp: 60 capsule, Rfl: 5   magnesium  30 MG tablet, Take 400 mg by mouth daily., Disp: , Rfl:    metFORMIN  (GLUCOPHAGE -XR) 500 MG 24 hr tablet, Take 1 tablet (500 mg total) by mouth 2 (two) times daily with a meal., Disp: 180 tablet, Rfl: 3   Sennosides 17.2 MG TABS, Take 1 tablet by mouth. 4 capsules daily, Disp: , Rfl:    Objective:     BP 102/72 (BP Location: Right Arm, Patient Position: Sitting, Cuff Size: Normal)   Pulse 80   Temp (!) 97 F (36.1 C) (Temporal)   Ht 5' 4 (1.626 m)   Wt 153 lb (69.4 kg)   SpO2 99%   BMI 26.26 kg/m  Wt Readings from Last 3  Encounters:  08/29/23 153 lb (69.4 kg)  07/21/23 161 lb (73 kg)  02/28/23 176 lb 3.2 oz (79.9 kg)      Physical Exam Constitutional:      General: She is not in acute distress.    Appearance: Normal appearance. She is not ill-appearing, toxic-appearing or diaphoretic.  HENT:     Head: Normocephalic and atraumatic.     Right Ear: External ear normal.     Left Ear: External ear normal.  Eyes:     General: No scleral icterus.       Right eye: No discharge.        Left eye: No discharge.     Extraocular Movements: Extraocular movements intact.     Conjunctiva/sclera: Conjunctivae normal.  Pulmonary:     Effort: Pulmonary effort is normal. No respiratory distress.  Skin:    General: Skin is warm and dry.  Neurological:     Mental Status: She is alert and oriented to person, place, and time.  Psychiatric:        Mood and Affect: Mood normal.        Behavior: Behavior normal.      No results found for any visits on 08/29/23.    The 10-year ASCVD risk score (Arnett DK, et al., 2019) is: 8.5%    Assessment & Plan:   Prediabetes -     Basic metabolic panel with GFR -     Hemoglobin A1c  Stage 3a chronic kidney disease (HCC) -     Basic metabolic panel with GFR  Hypokalemia -     Basic metabolic panel with GFR  Statin intolerance    Return in about 6 months (around 02/29/2024), or Continue weight loss efforts.  Please try to exercise such as walking for 30 minutes daily., for chronic disease follow-up.    [name], MD

## 2023-08-29 NOTE — Addendum Note (Signed)
 Addended by: BERNETA ELSIE LABOR on: 08/29/2023 03:25 PM   Modules accepted: Orders

## 2023-09-01 ENCOUNTER — Encounter: Payer: Self-pay | Admitting: Podiatry

## 2023-09-01 ENCOUNTER — Ambulatory Visit: Admitting: Podiatry

## 2023-09-01 DIAGNOSIS — M722 Plantar fascial fibromatosis: Secondary | ICD-10-CM

## 2023-09-01 DIAGNOSIS — M7671 Peroneal tendinitis, right leg: Secondary | ICD-10-CM | POA: Diagnosis not present

## 2023-09-01 MED ORDER — TRIAMCINOLONE ACETONIDE 10 MG/ML IJ SUSP
10.0000 mg | Freq: Once | INTRAMUSCULAR | Status: AC
  Administered 2023-09-01: 10 mg via INTRA_ARTICULAR

## 2023-09-04 NOTE — Progress Notes (Signed)
 Subjective:   Patient ID: Megan Valentine, female   DOB: 73 y.o.   MRN: [id number]   HPI Patient presents stating she seems to be doing better and if she can get 1 more injection she feels like it will really improve   ROS      Objective:  Physical Exam  Neurovascular status intact with diminishment of discomfort plantar left still has mild discomfort but improved from previous     Assessment:  Fascial inflammation that is currently improving     Plan:  Sterile prep and did do 1 more injection 3 mg Kenalog  5 mg Xylocaine  applied sterile dressing and will be seen back to recheck as needed

## 2023-09-12 ENCOUNTER — Encounter: Payer: Self-pay | Admitting: Family Medicine

## 2023-09-12 ENCOUNTER — Ambulatory Visit: Payer: Self-pay | Admitting: Family Medicine

## 2023-09-12 ENCOUNTER — Ambulatory Visit (INDEPENDENT_AMBULATORY_CARE_PROVIDER_SITE_OTHER): Admitting: Family Medicine

## 2023-09-12 VITALS — BP 104/66 | HR 67 | Temp 96.9°F | Ht 64.0 in | Wt 152.4 lb

## 2023-09-12 DIAGNOSIS — E039 Hypothyroidism, unspecified: Secondary | ICD-10-CM | POA: Diagnosis not present

## 2023-09-12 DIAGNOSIS — F4321 Adjustment disorder with depressed mood: Secondary | ICD-10-CM | POA: Diagnosis not present

## 2023-09-12 DIAGNOSIS — E876 Hypokalemia: Secondary | ICD-10-CM | POA: Diagnosis not present

## 2023-09-12 LAB — BASIC METABOLIC PANEL WITH GFR
BUN: 12 mg/dL (ref 6–23)
CO2: 27 meq/L (ref 19–32)
Calcium: 9.5 mg/dL (ref 8.4–10.5)
Chloride: 103 meq/L (ref 96–112)
Creatinine, Ser: 0.89 mg/dL (ref 0.40–1.20)
GFR: 64.29 mL/min (ref >60.00)
Glucose, Bld: 84 mg/dL (ref 70–99)
Potassium: 3.7 meq/L (ref 3.5–5.1)
Sodium: 139 meq/L (ref 135–145)

## 2023-09-12 LAB — MAGNESIUM: Magnesium: 2.2 mg/dL (ref 1.5–2.5)

## 2023-09-12 LAB — TSH: TSH: 0.89 u[IU]/mL (ref 0.35–5.50)

## 2023-09-12 MED ORDER — POTASSIUM CHLORIDE CRYS ER 20 MEQ PO TBCR: EXTENDED_RELEASE_TABLET | ORAL | 3 refills | Status: AC

## 2023-09-12 NOTE — Progress Notes (Addendum)
 Established Patient Office Visit   Subjective:  Patient ID: Megan Valentine, female    DOB: 03-30-1950  Age: 73 y.o. MRN: [id number]  Chief Complaint  Patient presents with   Follow-up    2 week follow up to recheck potassium levels. Pt complains of bilateral calf cramps and left hand cramps today. Also some fatigue.     HPI Encounter Diagnoses  Name Primary?   Hypokalemia Yes   Grieving    Acquired hypothyroidism    Follow-up of hypokalemia.  Has been taking the potassium tablets without issue.  She is taking no diuretics.  She has had muscle cramping associated with the hypokalemia.  Not sleeping well recently.  She has been thinking about her son who died 30 years ago in an MVA recently.  She manages her grief with her faith.   Review of Systems  Constitutional: Negative.   HENT: Negative.    Eyes:  Negative for blurred vision, discharge and redness.  Respiratory: Negative.    Cardiovascular: Negative.   Gastrointestinal:  Negative for abdominal pain.  Genitourinary: Negative.   Musculoskeletal: Negative.  Negative for myalgias.  Skin:  Negative for rash.  Neurological:  Negative for tingling, loss of consciousness and weakness.  Endo/Heme/Allergies:  Negative for polydipsia.  Psychiatric/Behavioral:  The patient has insomnia.      Current Outpatient Medications:    acetaminophen  (TYLENOL ) 325 MG tablet, Take 2 tablets (650 mg total) by mouth every 6 (six) hours as needed for moderate pain., Disp: 30 tablet, Rfl: 0   albuterol  (VENTOLIN  HFA) 108 (90 Base) MCG/ACT inhaler, INHALE 1 TO 2 PUFFS BY MOUTH EVERY 6 HOURS AS NEEDED FOR WHEEZING OR  SHORTNESS  OF  BREATH., Disp: 9 g, Rfl: 5   aspirin  81 MG chewable tablet, Chew 1 tablet (81 mg total) by mouth 2 (two) times daily., Disp: , Rfl:    Budeson-Glycopyrrol-Formoterol  (BREZTRI  AEROSPHERE) 160-9-4.8 MCG/ACT AERO, Inhale 2 puffs into the lungs in the morning and at bedtime., Disp: 10.7 g, Rfl: 11   Cranberry 12600 MG CAPS,  Take 1 tablet by mouth. daily, Disp: , Rfl:    diclofenac  Sodium (VOLTAREN  ARTHRITIS PAIN) 1 % GEL, Apply a small grape sized dollop 1 disorder 2 weeks of pends up to 4 times daily as needed, Disp: 150 g, Rfl: 1   docusate sodium  (COLACE) 250 MG capsule, Take 250 mg by mouth daily. Takes 4 daily, Disp: , Rfl:    esomeprazole  (NEXIUM ) 40 MG capsule, TAKE 1 CAPSULE BY MOUTH TWICE DAILY 30  MINUTES  BEFORE  BREAKFAST  AND  DINNER., Disp: 60 capsule, Rfl: 2   estradiol  (ESTRACE ) 0.5 MG tablet, Take 0.5 mg by mouth daily., Disp: , Rfl:    ezetimibe  (ZETIA ) 10 MG tablet, Take 1 tablet by mouth once daily, Disp: 90 tablet, Rfl: 3   famotidine (PEPCID) 20 MG tablet, Take 20 mg by mouth daily., Disp: , Rfl:    hydroxypropyl methylcellulose / hypromellose (ISOPTO TEARS / GONIOVISC) 2.5 % ophthalmic solution, Place 1 drop into both eyes 4 (four) times daily as needed for dry eyes., Disp: , Rfl:    ipratropium-albuterol  (DUONEB) 0.5-2.5 (3) MG/3ML SOLN, Take 3 mLs by nebulization every 6 (six) hours as needed., Disp: 360 mL, Rfl: 11   levocetirizine (XYZAL ) 5 MG tablet, Take 1 tablet (5 mg total) by mouth every evening., Disp: 30 tablet, Rfl: 3   levothyroxine  (SYNTHROID ) 112 MCG tablet, TAKE 1 TABLET BY MOUTH ONCE DAILY BEFORE BREAKFAST, Disp: 100 tablet,  Rfl: 0   lubiprostone  (AMITIZA ) 24 MCG capsule, Take 1 capsule (24 mcg total) by mouth 2 (two) times daily with a meal., Disp: 60 capsule, Rfl: 5   magnesium  30 MG tablet, Take 400 mg by mouth daily., Disp: , Rfl:    metFORMIN  (GLUCOPHAGE -XR) 500 MG 24 hr tablet, Take 1 tablet (500 mg total) by mouth 2 (two) times daily with a meal., Disp: 180 tablet, Rfl: 3   potassium chloride  SA (KLOR-CON  M) 20 MEQ tablet, Take 1 daily., Disp: 90 tablet, Rfl: 3   Sennosides 17.2 MG TABS, Take 1 tablet by mouth. 4 capsules daily, Disp: , Rfl:    Objective:     BP 104/66 (BP Location: Left Arm, Patient Position: Sitting, Cuff Size: Normal)   Pulse 67   Temp (!) 96.9 F  (36.1 C) (Temporal)   Ht 5' 4 (1.626 m)   Wt 152 lb 6.4 oz (69.1 kg)   SpO2 99%   BMI 26.16 kg/m  Wt Readings from Last 3 Encounters:  09/12/23 152 lb 6.4 oz (69.1 kg)  08/29/23 153 lb (69.4 kg)  07/21/23 161 lb (73 kg)      Physical Exam Constitutional:      General: She is not in acute distress.    Appearance: Normal appearance. She is not ill-appearing, toxic-appearing or diaphoretic.  HENT:     Head: Normocephalic and atraumatic.     Right Ear: External ear normal.     Left Ear: External ear normal.  Eyes:     General: No scleral icterus.       Right eye: No discharge.        Left eye: No discharge.     Extraocular Movements: Extraocular movements intact.     Conjunctiva/sclera: Conjunctivae normal.  Pulmonary:     Effort: Pulmonary effort is normal. No respiratory distress.  Skin:    General: Skin is warm and dry.  Neurological:     Mental Status: She is alert and oriented to person, place, and time.  Psychiatric:        Mood and Affect: Mood normal.        Behavior: Behavior normal.      No results found for any visits on 09/12/23.    The 10-year ASCVD risk score (Arnett DK, et al., 2019) is: 8.8%    Assessment & Plan:   Hypokalemia -     Basic metabolic panel with GFR -     Magnesium  -     Potassium Chloride  [facility]; Take 1 daily.  Dispense: 90 tablet; Refill: 3  Grieving  Acquired hypothyroidism -     TSH    Return Should have follow-up in early February but return sooner if needed.  Consider checking aldosterone level.   [name], MD

## 2023-09-21 ENCOUNTER — Encounter: Payer: Self-pay | Admitting: Family Medicine

## 2023-10-05 ENCOUNTER — Other Ambulatory Visit: Payer: Self-pay | Admitting: Family Medicine

## 2023-10-05 DIAGNOSIS — Z1231 Encounter for screening mammogram for malignant neoplasm of breast: Secondary | ICD-10-CM

## 2023-10-07 ENCOUNTER — Other Ambulatory Visit: Payer: Self-pay | Admitting: Family Medicine

## 2023-10-07 DIAGNOSIS — E039 Hypothyroidism, unspecified: Secondary | ICD-10-CM

## 2023-10-12 ENCOUNTER — Ambulatory Visit: Admitting: Family Medicine

## 2023-10-12 ENCOUNTER — Other Ambulatory Visit: Payer: Self-pay

## 2023-10-12 ENCOUNTER — Ambulatory Visit

## 2023-10-12 ENCOUNTER — Encounter: Payer: Self-pay | Admitting: Family Medicine

## 2023-10-12 VITALS — BP 122/80 | HR 64 | Ht 64.0 in | Wt 163.0 lb

## 2023-10-12 DIAGNOSIS — M7711 Lateral epicondylitis, right elbow: Secondary | ICD-10-CM

## 2023-10-12 DIAGNOSIS — M533 Sacrococcygeal disorders, not elsewhere classified: Secondary | ICD-10-CM | POA: Diagnosis not present

## 2023-10-12 DIAGNOSIS — M25521 Pain in right elbow: Secondary | ICD-10-CM

## 2023-10-12 NOTE — Patient Instructions (Signed)
Thank you for coming in today.   Please get an Xray today before you leave   You received an injection today. Seek immediate medical attention if the joint becomes red, extremely painful, or is oozing fluid.

## 2023-10-12 NOTE — Progress Notes (Unsigned)
 I, [name], CMA acting as a scribe for [name], MD.  Megan Valentine is a 73 y.o. female who presents to [facility] at [facility] today for exacerbation of her R elbow pain. Pt was last seen by Dr. Lloyd on 12/26/22 and was given a R elbow lateral epicondyle steroid injection.  Today, pt reports falling this past Tuesday morning landing on the tailbone. Hx of coccyx fx. Sitting on seat cushion today. Has been using heat prn.   Pt also c/o exacerbation of right elbow sx over the past 6 weeks. Some swelling present. Locates pain to lateral aspect of the elbow. Worse with extension. Taking Tylenol  prn. No relief with Tylenol  or Voltaren  Gel.   Pertinent review of systems: No fevers or chills  Relevant historical information: Hypothyroidism prior history of coccyx fracture years ago.   Exam:  BP 122/80   Pulse 64   Ht 5' 4 (1.626 m)   Wt 163 lb (73.9 kg)   SpO2 96%   BMI 27.98 kg/m  General: Well Developed, well nourished, and in no acute distress.   MSK: Right elbow normal appearing. Tender palpation lateral epicondyle.  Normal wrist motion pain with wrist extension.  Coccyx tender to palpation.  Lab and Radiology Results  Procedure: Real-time Ultrasound Guided Injection of right elbow lateral epicondyle common extensor tendon origin Device: Philips Affiniti 50G/GE Logiq Images permanently stored and available for review in PACS Verbal informed consent obtained.  Discussed risks and benefits of procedure. Warned about infection, bleeding, hyperglycemia damage to structures among others. Patient expresses understanding and agreement Time-out conducted.   Noted no overlying erythema, induration, or other signs of local infection.   Skin prepped in a sterile fashion.   Local anesthesia: Topical Ethyl chloride.   With sterile technique and under real time ultrasound guidance: 40 mg of Kenalog  and 1 mL of lidocaine  injected into right lateral elbow  common extensor tendon origin. Fluid seen entering the lateral epicondyle.   Completed without difficulty   Pain immediately resolved suggesting accurate placement of the medication.   Advised to call if fevers/chills, erythema, induration, drainage, or persistent bleeding.   Images permanently stored and available for review in the ultrasound unit.  Impression: Technically successful ultrasound guided injection.      No results found for this or any previous visit (from the past 72 hours). DG Sacrum/Coccyx Result Date: 10/12/2023 EXAM: 3 VIEW(S) XRAY OF THE SACRUM AND COCCYX 10/12/2023 03:00:41 PM COMPARISON: CT 11/30/22 CLINICAL HISTORY: Fall, coccyx pain. Fall x 2 days. FINDINGS: BONES AND JOINTS: Cortical irregularity of the S5 segment may represent a minimally displaced fracture. No disruption of sacral ala. No disruption of sacroiliac joints. Remainder of the bony pelvis is intact. SOFT TISSUES: The soft tissues are unremarkable. IMPRESSION: 1. Findings suspicious for minimally displaced S5 sacral fracture. Electronically signed by: Andrea Gasman MD 10/12/2023 04:17 PM EDT RP Workstation: [id number]  The above documentation has been reviewed and is accurate and complete [name], M.D.      Assessment and Plan: 73 y.o. female with right lateral epicondylitis.Plan for injection today and continued home exercise plan.   Coccyx injury: Xray shows the potential for a non-displaced sacrum fracture. Plan for seat cushion and reduced weightbearing if needed. Plan to get another xray in about 1 month.     PDMP not reviewed this encounter. Orders Placed This Encounter  Procedures   US  LIMITED JOINT SPACE STRUCTURES UP RIGHT(NO LINKED CHARGES)    Reason for  Exam (SYMPTOM  OR DIAGNOSIS REQUIRED):   right elbow pain    Preferred imaging location?:   [facility] Sacrum/Coccyx    Standing Status:   Future    Number of Occurrences:   1    Expiration Date:    12/11/2024    Reason for Exam (SYMPTOM  OR DIAGNOSIS REQUIRED):   fall, coccyx pain    Preferred imaging location?:   [facility]   No orders of the defined types were placed in this encounter.    Discussed warning signs or symptoms. Please see discharge instructions. Patient expresses understanding.   The above documentation has been reviewed and is accurate and complete [name], M.D.

## 2023-10-13 ENCOUNTER — Ambulatory Visit: Payer: Self-pay | Admitting: Family Medicine

## 2023-10-13 NOTE — Progress Notes (Signed)
 Coccyx x-ray does show the possibility of a fracture at S5 which is the very very bottom of the sacrum right near the coccyx.  If your pain is reasonably well-controlled and you can walk without significant pain beyond using a cushion and some over-the-counter medicines for pain you do not really need to do much else.  If you are having significant pain with walking using a walker would help to offload the pelvis and reduce pain.  We should get x-rays of that sacrum again in about 1 month.  Please schedule a follow-up appointment with me.

## 2023-10-19 NOTE — Progress Notes (Unsigned)
 Ellouise Console, PA-C [address] [place], [place]  [address] Phone: [phone]   Primary Care Physician: Berneta Elsie Sayre, MD  Primary Gastroenterologist:  Ellouise Console, PA-C / Glendia Holt, MD   Chief Complaint: Follow-up constipation       HPI:   Megan Valentine is a 73 y.o. female returns for 85-month follow-up of chronic constipation, irritable bowel syndrome, and GERD.  Previously tried Linzess  290 which did not work well.  She has had chronic lifelong constipation since childhood.  3 months ago she was switched from Linzess  to Amitiza  24 mcg twice daily.  Continued on stool softener, Senokot, and magnesium  daily.  Patient states Amitiza  is working well.  Constipation has resolved.  She is having a normal bowel movement once or twice daily.  Needs a refill.  She has no side effects to the medication.  No GI concerns today.  She takes Nexium  40 mg twice daily and famotidine 20 Mg once daily with good control of GERD.  PMH: arthritis, asthma, migraine headaches, CKD stage III, GERD, diverticulosis and colon polyps. Past appendectomy age 2, hysterectomy 1999, cholecystectomy 11/2018, rectocele repair 08/2018    EGD and colonoscopy 11/30/2021. The EGD showed a 3 cm hiatal hernia otherwise was normal. The colonoscopy identified 8 tubular adenomatous polyps removed from the colon, diverticulosis, melanosis coli and nonbleeding internal hemorrhoids. She was advised to repeat a colonoscopy in 3 years (due 11/2024).   Current Outpatient Medications  Medication Sig Dispense Refill   acetaminophen  (TYLENOL ) 325 MG tablet Take 2 tablets (650 mg total) by mouth every 6 (six) hours as needed for moderate pain. 30 tablet 0   albuterol  (VENTOLIN  HFA) 108 (90 Base) MCG/ACT inhaler INHALE 1 TO 2 PUFFS BY MOUTH EVERY 6 HOURS AS NEEDED FOR WHEEZING OR  SHORTNESS  OF  BREATH. 9 g 5   aspirin  81 MG chewable tablet Chew 1 tablet (81 mg total) by mouth 2 (two) times daily.      Budeson-Glycopyrrol-Formoterol  (BREZTRI  AEROSPHERE) 160-9-4.8 MCG/ACT AERO Inhale 2 puffs into the lungs in the morning and at bedtime. 10.7 g 11   Cranberry 12600 MG CAPS Take 1 tablet by mouth. daily     diclofenac  Sodium (VOLTAREN  ARTHRITIS PAIN) 1 % GEL Apply a small grape sized dollop 1 disorder 2 weeks of pends up to 4 times daily as needed 150 g 1   docusate sodium  (COLACE) 250 MG capsule Take 250 mg by mouth daily. Takes 4 daily     esomeprazole  (NEXIUM ) 40 MG capsule TAKE 1 CAPSULE BY MOUTH TWICE DAILY 30  MINUTES  BEFORE  BREAKFAST  AND  DINNER. 60 capsule 2   estradiol  (ESTRACE ) 0.5 MG tablet Take 0.5 mg by mouth daily.     ezetimibe  (ZETIA ) 10 MG tablet Take 1 tablet by mouth once daily 90 tablet 3   famotidine (PEPCID) 20 MG tablet Take 20 mg by mouth daily.     hydroxypropyl methylcellulose / hypromellose (ISOPTO TEARS / GONIOVISC) 2.5 % ophthalmic solution Place 1 drop into both eyes 4 (four) times daily as needed for dry eyes.     ipratropium-albuterol  (DUONEB) 0.5-2.5 (3) MG/3ML SOLN Take 3 mLs by nebulization every 6 (six) hours as needed. 360 mL 11   levocetirizine (XYZAL ) 5 MG tablet Take 1 tablet (5 mg total) by mouth every evening. 30 tablet 3   levothyroxine  (SYNTHROID ) 112 MCG tablet TAKE 1 TABLET BY MOUTH ONCE DAILY BEFORE BREAKFAST 100 tablet 0   magnesium   30 MG tablet Take 400 mg by mouth daily.     metFORMIN  (GLUCOPHAGE -XR) 500 MG 24 hr tablet Take 1 tablet (500 mg total) by mouth 2 (two) times daily with a meal. 180 tablet 3   potassium chloride  SA (KLOR-CON  M) 20 MEQ tablet Take 1 daily. 90 tablet 3   Sennosides 17.2 MG TABS Take 1 tablet by mouth. 4 capsules daily     lubiprostone  (AMITIZA ) 24 MCG capsule Take 1 capsule (24 mcg total) by mouth 2 (two) times daily with a meal. 180 capsule 3   No current facility-administered medications for this visit.    Allergies as of 10/20/2023 - Review Complete 10/20/2023  Allergen Reaction Noted   Tetracycline Hives and  Itching    Dilaudid  [hydromorphone  hcl] Nausea And Vomiting 12/14/2018   Other  07/11/2019    Past Medical History:  Diagnosis Date   Allergy    Arthritis    Asthma    Cataract    Chronic bronchitis (HCC)    Chronic kidney disease    stage 3, kidney infections   Complication of anesthesia    hard to wake up   Diverticulosis    Environmental allergies    GERD (gastroesophageal reflux disease)    Hiatal hernia    History of chicken pox    Hyperlipidemia    Hypothyroidism    IBS (irritable bowel syndrome)    Migraines    Mitral valve prolapse    Per pt . per Dr.Henry  [name].  does not have MVP   Neuromuscular disorder (HCC)    neuropathy feet   Pneumonia    Rectal prolapse     Past Surgical History:  Procedure Laterality Date   ABDOMINAL HYSTERECTOMY  1999   APPENDECTOMY  1962   BREAST CYST ASPIRATION Bilateral    CHOLECYSTECTOMY N/A 12/17/2018   Procedure: LAPAROSCOPIC CHOLECYSTECTOMY;  Surgeon: Signe Mitzie LABOR, MD;  Location: [facility] OR;  Service: General;  Laterality: N/A;   COLONOSCOPY     EYE SURGERY     cataract   JOINT REPLACEMENT     rt knee scope   REFRACTIVE SURGERY  2000   both eyes   retrocele repair     and bladder sling   TOTAL KNEE ARTHROPLASTY Right 03/24/2015   Procedure: TOTAL KNEE ARTHROPLASTY;  Surgeon: Maude LELON Right, MD;  Location: [facility] OR;  Service: Orthopedics;  Laterality: Right;   TOTAL KNEE ARTHROPLASTY Left 11/17/2020   Procedure: LEFT TOTAL KNEE ARTHROPLASTY;  Surgeon: Right Maude LELON, MD;  Location: [facility];  Service: Orthopedics;  Laterality: Left;   TUBAL LIGATION  1977   UPPER GASTROINTESTINAL ENDOSCOPY      Review of Systems:    All systems reviewed and negative except where noted in HPI.    Physical Exam:  BP 118/76   Pulse 77   Ht 5' 4.25 (1.632 m)   Wt 150 lb 4 oz (68.2 kg)   BMI 25.59 kg/m  No LMP recorded. Patient has had a hysterectomy.  General: Well-nourished, well-developed in no acute distress.  Neuro:  Alert and oriented x 3.  Grossly intact.  Psych: Alert and cooperative, normal mood and affect.   Imaging Studies: DG Sacrum/Coccyx Result Date: 10/12/2023 EXAM: 3 VIEW(S) XRAY OF THE SACRUM AND COCCYX 10/12/2023 03:00:41 PM COMPARISON: CT 11/30/22 CLINICAL HISTORY: Fall, coccyx pain. Fall x 2 days. FINDINGS: BONES AND JOINTS: Cortical irregularity of the S5 segment may represent a minimally displaced fracture. No disruption of sacral ala. No disruption of  sacroiliac joints. Remainder of the bony pelvis is intact. SOFT TISSUES: The soft tissues are unremarkable. IMPRESSION: 1. Findings suspicious for minimally displaced S5 sacral fracture. Electronically signed by: Andrea Gasman MD 10/12/2023 04:17 PM EDT RP Workstation: [id number]    Labs: CBC    Component Value Date/Time   WBC 9.0 02/28/2023 0946   RBC 4.68 02/28/2023 0946   HGB 14.4 02/28/2023 0946   HCT 44.2 02/28/2023 0946   PLT 373.0 02/28/2023 0946   MCV 94.4 02/28/2023 0946   MCV 98.2 (A) 10/11/2012 2010   MCH 32.5 11/17/2020 1253   MCHC 32.7 02/28/2023 0946   RDW 14.7 02/28/2023 0946   LYMPHSABS 1.8 10/19/2022 1555   MONOABS 0.8 10/19/2022 1555   EOSABS 0.3 10/19/2022 1555   BASOSABS 0.1 10/19/2022 1555    CMP     Component Value Date/Time   NA 139 09/12/2023 1142   NA 143 06/16/2022 1031   K 3.7 09/12/2023 1142   CL 103 09/12/2023 1142   CO2 27 09/12/2023 1142   GLUCOSE 84 09/12/2023 1142   BUN 12 09/12/2023 1142   BUN 15 06/16/2022 1031   CREATININE 0.89 09/12/2023 1142   CREATININE 0.93 01/01/2019 1505   CALCIUM 9.5 09/12/2023 1142   CALCIUM 9.5 09/16/2011 0844   PROT 6.8 10/19/2022 1555   ALBUMIN 4.0 10/19/2022 1555   AST 18 10/19/2022 1555   ALT 18 10/19/2022 1555   ALKPHOS 85 10/19/2022 1555   BILITOT 0.3 10/19/2022 1555   GFRNONAA >60 11/20/2020 0308   GFRAA >60 12/17/2018 0213       Assessment and Plan:   Megan Valentine is a 73 y.o. y/o female returns for follow-up of:  1.  Chronic  lifelong constipation / IBS-C - Controlled on Amitiza  - Continue Amitiza  24 mcg twice daily. #180, 3 RF. - Continue stool softener, senna, and magnesium  daily. - Continue high-fiber diet and 64 ounces of water daily.  2.  GERD - Continue Nexium  and famotidine. - Recommend Lifestyle Modifications to prevent Acid Reflux.  Rec. Avoid coffee, sodas, peppermint, garlic, onions, alcohol , citrus fruits, chocolate, tomatoes, fatty and spicey foods.  Avoid eating 2-3 hours before bedtime.    3.  History of adenomatous colon polyps - 3-year repeat colonoscopy will be due 11/2024.  Ellouise Console, PA-C  Follow up in 1 year to refill medication and schedule repeat colonoscopy.

## 2023-10-20 ENCOUNTER — Encounter: Payer: Self-pay | Admitting: Physician Assistant

## 2023-10-20 ENCOUNTER — Ambulatory Visit: Admitting: Physician Assistant

## 2023-10-20 DIAGNOSIS — Z860101 Personal history of adenomatous and serrated colon polyps: Secondary | ICD-10-CM

## 2023-10-20 DIAGNOSIS — K219 Gastro-esophageal reflux disease without esophagitis: Secondary | ICD-10-CM

## 2023-10-20 DIAGNOSIS — K5904 Chronic idiopathic constipation: Secondary | ICD-10-CM

## 2023-10-20 DIAGNOSIS — K581 Irritable bowel syndrome with constipation: Secondary | ICD-10-CM

## 2023-10-20 DIAGNOSIS — K5909 Other constipation: Secondary | ICD-10-CM

## 2023-10-20 MED ORDER — LUBIPROSTONE 24 MCG PO CAPS: 24.0000 ug | ORAL_CAPSULE | Freq: Two times a day (BID) | ORAL | 3 refills | Status: AC

## 2023-10-20 NOTE — Patient Instructions (Addendum)
 We have sent the following medications to your pharmacy for you to pick up at your convenience: Amitiza  24 mcg twice daily  Please follow up sooner if symptoms increase or worsen  Due to recent changes in healthcare laws, you may see the results of your imaging and laboratory studies on MyChart before your provider has had a chance to review them.  We understand that in some cases there may be results that are confusing or concerning to you. Not all laboratory results come back in the same time frame and the provider may be waiting for multiple results in order to interpret others.  Please give us  48 hours in order for your provider to thoroughly review all the results before contacting the office for clarification of your results.   Thank you for trusting me with your gastrointestinal care!   Ellouise Console, PA-C _______________________________________________________  If your blood pressure at your visit was 140/90 or greater, please contact your primary care physician to follow up on this.  _______________________________________________________  If you are age 69 or older, your body mass index should be between 23-30. Your Body mass index is 25.59 kg/m. If this is out of the aforementioned range listed, please consider follow up with your Primary Care Provider.  If you are age 24 or younger, your body mass index should be between 19-25. Your Body mass index is 25.59 kg/m. If this is out of the aformentioned range listed, please consider follow up with your Primary Care Provider.   ________________________________________________________  The [facility] providers would like to encourage you to use MYCHART to communicate with providers for non-urgent requests or questions.  Due to long hold times on the telephone, sending your provider a message by [facility] may be a faster and more efficient way to get a response.  Please allow 48 business hours for a response.  Please remember that this is for  non-urgent requests.  _______________________________________________________

## 2023-10-23 NOTE — Progress Notes (Signed)
 Agree with the assessment and plan as outlined by Brigitte Canard, PA-C.

## 2023-10-27 ENCOUNTER — Telehealth: Admitting: Physician Assistant

## 2023-10-27 DIAGNOSIS — J019 Acute sinusitis, unspecified: Secondary | ICD-10-CM | POA: Diagnosis not present

## 2023-10-27 DIAGNOSIS — B9689 Other specified bacterial agents as the cause of diseases classified elsewhere: Secondary | ICD-10-CM | POA: Diagnosis not present

## 2023-10-27 MED ORDER — AMOXICILLIN-POT CLAVULANATE 875-125 MG PO TABS: 1.0000 | ORAL_TABLET | Freq: Two times a day (BID) | ORAL | 0 refills | Status: DC

## 2023-10-27 NOTE — Patient Instructions (Signed)
 Megan Valentine, thank you for joining Megan CHRISTELLA Dickinson, PA-C for today's virtual visit.  While this provider is not your primary care provider (PCP), if your PCP is located in our provider database this encounter information will be shared with them immediately following your visit.   A [place] [place] MyChart account gives you access to today's visit and all your visits, tests, and labs performed at [facility]  click here if you don't have a [place] MyChart account or go to [email or web address]  Consent: (Patient) Megan Valentine provided verbal consent for this virtual visit at the beginning of the encounter.  Current Medications:  Current Outpatient Medications:    amoxicillin -clavulanate (AUGMENTIN ) 875-125 MG tablet, Take 1 tablet by mouth 2 (two) times daily., Disp: 14 tablet, Rfl: 0   acetaminophen  (TYLENOL ) 325 MG tablet, Take 2 tablets (650 mg total) by mouth every 6 (six) hours as needed for moderate pain., Disp: 30 tablet, Rfl: 0   albuterol  (VENTOLIN  HFA) 108 (90 Base) MCG/ACT inhaler, INHALE 1 TO 2 PUFFS BY MOUTH EVERY 6 HOURS AS NEEDED FOR WHEEZING OR  SHORTNESS  OF  BREATH., Disp: 9 g, Rfl: 5   aspirin  81 MG chewable tablet, Chew 1 tablet (81 mg total) by mouth 2 (two) times daily., Disp: , Rfl:    Budeson-Glycopyrrol-Formoterol  (BREZTRI  AEROSPHERE) 160-9-4.8 MCG/ACT AERO, Inhale 2 puffs into the lungs in the morning and at bedtime., Disp: 10.7 g, Rfl: 11   Cranberry 12600 MG CAPS, Take 1 tablet by mouth. daily, Disp: , Rfl:    diclofenac  Sodium (VOLTAREN  ARTHRITIS PAIN) 1 % GEL, Apply a small grape sized dollop 1 disorder 2 weeks of pends up to 4 times daily as needed, Disp: 150 g, Rfl: 1   docusate sodium  (COLACE) 250 MG capsule, Take 250 mg by mouth daily. Takes 4 daily, Disp: , Rfl:    esomeprazole  (NEXIUM ) 40 MG capsule, TAKE 1 CAPSULE BY MOUTH TWICE DAILY 30  MINUTES  BEFORE  BREAKFAST  AND  DINNER., Disp: 60 capsule, Rfl: 2   estradiol  (ESTRACE ) 0.5 MG  tablet, Take 0.5 mg by mouth daily., Disp: , Rfl:    ezetimibe  (ZETIA ) 10 MG tablet, Take 1 tablet by mouth once daily, Disp: 90 tablet, Rfl: 3   famotidine (PEPCID) 20 MG tablet, Take 20 mg by mouth daily., Disp: , Rfl:    hydroxypropyl methylcellulose / hypromellose (ISOPTO TEARS / GONIOVISC) 2.5 % ophthalmic solution, Place 1 drop into both eyes 4 (four) times daily as needed for dry eyes., Disp: , Rfl:    ipratropium-albuterol  (DUONEB) 0.5-2.5 (3) MG/3ML SOLN, Take 3 mLs by nebulization every 6 (six) hours as needed., Disp: 360 mL, Rfl: 11   levocetirizine (XYZAL ) 5 MG tablet, Take 1 tablet (5 mg total) by mouth every evening., Disp: 30 tablet, Rfl: 3   levothyroxine  (SYNTHROID ) 112 MCG tablet, TAKE 1 TABLET BY MOUTH ONCE DAILY BEFORE BREAKFAST, Disp: 100 tablet, Rfl: 0   lubiprostone  (AMITIZA ) 24 MCG capsule, Take 1 capsule (24 mcg total) by mouth 2 (two) times daily with a meal., Disp: 180 capsule, Rfl: 3   magnesium  30 MG tablet, Take 400 mg by mouth daily., Disp: , Rfl:    metFORMIN  (GLUCOPHAGE -XR) 500 MG 24 hr tablet, Take 1 tablet (500 mg total) by mouth 2 (two) times daily with a meal., Disp: 180 tablet, Rfl: 3   potassium chloride  SA (KLOR-CON  M) 20 MEQ tablet, Take 1 daily., Disp: 90 tablet, Rfl: 3   Sennosides 17.2 MG TABS, Take  1 tablet by mouth. 4 capsules daily, Disp: , Rfl:    Medications ordered in this encounter:  Meds ordered this encounter  Medications   amoxicillin -clavulanate (AUGMENTIN ) 875-125 MG tablet    Sig: Take 1 tablet by mouth 2 (two) times daily.    Dispense:  14 tablet    Refill:  0    Supervising Provider:   BLAISE ALEENE KIDD [[id number]]     *If you need refills on other medications prior to your next appointment, please contact your pharmacy*  Follow-Up: Call back or seek an in-person evaluation if the symptoms worsen or if the condition fails to improve as anticipated.  [facility] [phone]  Other Instructions Sinus Infection,  Adult A sinus infection, also called sinusitis, is inflammation of your sinuses. Sinuses are hollow spaces in the bones around your face. Your sinuses are located: Around your eyes. In the middle of your forehead. Behind your nose. In your cheekbones. Mucus normally drains out of your sinuses. When your nasal tissues become inflamed or swollen, mucus can become trapped or blocked. This allows bacteria, viruses, and fungi to grow, which leads to infection. Most infections of the sinuses are caused by a virus. A sinus infection can develop quickly. It can last for up to 4 weeks (acute) or for more than 12 weeks (chronic). A sinus infection often develops after a cold. What are the causes? This condition is caused by anything that creates swelling in the sinuses or stops mucus from draining. This includes: Allergies. Asthma. Infection from bacteria or viruses. Deformities or blockages in your nose or sinuses. Abnormal growths in the nose (nasal polyps). Pollutants, such as chemicals or irritants in the air. Infection from fungi. This is rare. What increases the risk? You are more likely to develop this condition if you: Have a weak body defense system (immune system). Do a lot of swimming or diving. Overuse nasal sprays. Smoke. What are the signs or symptoms? The main symptoms of this condition are pain and a feeling of pressure around the affected sinuses. Other symptoms include: Stuffy nose or congestion that makes it difficult to breathe through your nose. Thick yellow or greenish drainage from your nose. Tenderness, swelling, and warmth over the affected sinuses. A cough that may get worse at night. Decreased sense of smell and taste. Extra mucus that collects in the throat or the back of the nose (postnasal drip) causing a sore throat or bad breath. Tiredness (fatigue). Fever. How is this diagnosed? This condition is diagnosed based on: Your symptoms. Your medical history. A  physical exam. Tests to find out if your condition is acute or chronic. This may include: Checking your nose for nasal polyps. Viewing your sinuses using a device that has a light (endoscope). Testing for allergies or bacteria. Imaging tests, such as an MRI or CT scan. In rare cases, a bone biopsy may be done to rule out more serious types of fungal sinus disease. How is this treated? Treatment for a sinus infection depends on the cause and whether your condition is chronic or acute. If caused by a virus, your symptoms should go away on their own within 10 days. You may be given medicines to relieve symptoms. They include: Medicines that shrink swollen nasal passages (decongestants). A spray that eases inflammation of the nostrils (topical intranasal corticosteroids). Rinses that help get rid of thick mucus in your nose (nasal saline washes). Medicines that treat allergies (antihistamines). Over-the-counter pain relievers. If caused by bacteria, your  health care provider may recommend waiting to see if your symptoms improve. Most bacterial infections will get better without antibiotic medicine. You may be given antibiotics if you have: A severe infection. A weak immune system. If caused by narrow nasal passages or nasal polyps, surgery may be needed. Follow these instructions at home: Medicines Take, use, or apply over-the-counter and prescription medicines only as told by your health care provider. These may include nasal sprays. If you were prescribed an antibiotic medicine, take it as told by your health care provider. Do not stop taking the antibiotic even if you start to feel better. Hydrate and humidify  Drink enough fluid to keep your urine pale yellow. Staying hydrated will help to thin your mucus. Use a cool mist humidifier to keep the humidity level in your home above 50%. Inhale steam for 10-15 minutes, 3-4 times a day, or as told by your health care provider. You can do this in  the bathroom while a hot shower is running. Limit your exposure to cool or dry air. Rest Rest as much as possible. Sleep with your head raised (elevated). Make sure you get enough sleep each night. General instructions  Apply a warm, moist washcloth to your face 3-4 times a day or as told by your health care provider. This will help with discomfort. Use nasal saline washes as often as told by your health care provider. Wash your hands often with soap and water to reduce your exposure to germs. If soap and water are not available, use hand sanitizer. Do not smoke. Avoid being around people who are smoking (secondhand smoke). Keep all follow-up visits. This is important. Contact a health care provider if: You have a fever. Your symptoms get worse. Your symptoms do not improve within 10 days. Get help right away if: You have a severe headache. You have persistent vomiting. You have severe pain or swelling around your face or eyes. You have vision problems. You develop confusion. Your neck is stiff. You have trouble breathing. These symptoms may be an emergency. Get help right away. Call 911. Do not wait to see if the symptoms will go away. Do not drive yourself to the hospital. Summary A sinus infection is soreness and inflammation of your sinuses. Sinuses are hollow spaces in the bones around your face. This condition is caused by nasal tissues that become inflamed or swollen. The swelling traps or blocks the flow of mucus. This allows bacteria, viruses, and fungi to grow, which leads to infection. If you were prescribed an antibiotic medicine, take it as told by your health care provider. Do not stop taking the antibiotic even if you start to feel better. Keep all follow-up visits. This is important. This information is not intended to replace advice given to you by your health care provider. Make sure you discuss any questions you have with your health care provider. Document  Revised: 12/15/2020 Document Reviewed: 12/15/2020 Elsevier Patient Education  2024 Elsevier Inc.   If you have been instructed to have an in-person evaluation today at a local Urgent Care facility, please use the link below. It will take you to a list of all of our available [facility], including address, phone number and hours of operation. Please do not delay care.  [facility]  If you or a family member do not have a primary care provider, use the link below to schedule a visit and establish care. When you choose a [facility] primary care  physician or advanced practice provider, you gain a long-term partner in health. Find a Primary Care Provider  Learn more about [facility]'s in-office and virtual care options: [facility] - Get Care Now

## 2023-10-27 NOTE — Progress Notes (Signed)
 Virtual Visit Consent   Megan Valentine, you are scheduled for a virtual visit with a [facility] provider today. Just as with appointments in the office, your consent must be obtained to participate. Your consent will be active for this visit and any virtual visit you may have with one of our providers in the next 365 days. If you have a MyChart account, a copy of this consent can be sent to you electronically.  As this is a virtual visit, video technology does not allow for your provider to perform a traditional examination. This may limit your provider's ability to fully assess your condition. If your provider identifies any concerns that need to be evaluated in person or the need to arrange testing (such as labs, EKG, etc.), we will make arrangements to do so. Although advances in technology are sophisticated, we cannot ensure that it will always work on either your end or our end. If the connection with a video visit is poor, the visit may have to be switched to a telephone visit. With either a video or telephone visit, we are not always able to ensure that we have a secure connection.  By engaging in this virtual visit, you consent to the provision of healthcare and authorize for your insurance to be billed (if applicable) for the services provided during this visit. Depending on your insurance coverage, you may receive a charge related to this service.  I need to obtain your verbal consent now. Are you willing to proceed with your visit today? Megan Valentine has provided verbal consent on 10/27/2023 for a virtual visit (video or telephone). [name], PA-C  Date: 10/27/2023 12:37 PM   Virtual Visit via Video Note   IDelon CHRISTELLA Dickinson, connected with  Megan Valentine  ([id number], Oct 14, 1950) on 10/27/23 at 12:30 PM EDT by a video-enabled telemedicine application and verified that I am speaking with the correct person using two identifiers.  Location: Patient: Virtual Visit Location  Patient: Home Provider: Virtual Visit Location Provider: Home Office   I discussed the limitations of evaluation and management by telemedicine and the availability of in person appointments. The patient expressed understanding and agreed to proceed.    History of Present Illness: Megan Valentine is a 73 y.o. who identifies as a female who was assigned female at birth, and is being seen today for sinus pain.  HPI: Sinusitis This is a new problem. The current episode started in the past 7 days. The problem has been gradually worsening since onset. There has been no fever. The pain is moderate. Associated symptoms include congestion, ear pain (left), headaches (left), sinus pressure (left), a sore throat (mild) and swollen glands (left). Pertinent negatives include no chills, coughing or neck pain. Past treatments include acetaminophen  and saline nose sprays (inhaler for asthma, antihistamine). The treatment provided no relief.     Problems:  Patient Active Problem List   Diagnosis Date Noted   Grieving 09/12/2023   Elevated cholesterol 07/29/2022   Prediabetes 07/29/2022   DOE (dyspnea on exertion) 06/10/2022   Left hip pain 02/21/2022   Myalgia due to statin 01/04/2022   Arthritis of carpometacarpal [facility]) joint of both thumbs 09/07/2021   Carpal tunnel syndrome of right wrist 09/06/2021   Primary osteoarthritis of right hand 09/06/2021   Neuropathy 05/13/2021   Venous insufficiency 05/13/2021   Low back pain 04/05/2021   Acquired leg length discrepancy 04/05/2021   Urinary urgency 01/14/2021   Acute cystitis without hematuria 01/14/2021  Macrocytosis 12/07/2020   S/P TKR (total knee replacement) using cement, left 11/17/2020   Chronic pain of right knee 09/30/2020   Medication side effect 09/30/2020   Statin intolerance 06/30/2020   Trigger point of shoulder region, left 02/25/2020   B12 deficiency 08/15/2019   Elevated glucose 08/15/2019   Anemia 02/15/2019   Abdominal pain  12/14/2018   Sinus pressure 11/12/2018   History of 2019 novel coronavirus disease (COVID-19) 11/12/2018   Pleurisy 06/05/2018   Enterocele 06/05/2018   Rectocele 05/23/2018   Vaginal vault prolapse 05/23/2018   SUI (stress urinary incontinence, female) 05/23/2018   Asthma 05/07/2018   Stage 3a chronic kidney disease (HCC) 01/15/2018   Edema 12/15/2017   Insomnia 11/20/2017   Seborrheic dermatitis 11/16/2017   Seasonal allergic rhinitis due to pollen 11/16/2017   Menopausal and female climacteric states 10/19/2017   Constipation 03/26/2015   Unilateral primary osteoarthritis, left knee 11/17/2014   Healthcare maintenance 04/25/2014   Cold hands and feet 09/15/2011   Hypokalemia 09/15/2011   Hypothyroidism 08/21/2008   Mixed hyperlipidemia 08/21/2008    Allergies:  Allergies  Allergen Reactions   Tetracycline Hives and Itching   Dilaudid  [Hydromorphone  Hcl] Nausea And Vomiting   Other     Cigarette smoke and perfumes--flares asthma (develops bronchitis)   Medications:  Current Outpatient Medications:    amoxicillin -clavulanate (AUGMENTIN ) 875-125 MG tablet, Take 1 tablet by mouth 2 (two) times daily., Disp: 14 tablet, Rfl: 0   acetaminophen  (TYLENOL ) 325 MG tablet, Take 2 tablets (650 mg total) by mouth every 6 (six) hours as needed for moderate pain., Disp: 30 tablet, Rfl: 0   albuterol  (VENTOLIN  HFA) 108 (90 Base) MCG/ACT inhaler, INHALE 1 TO 2 PUFFS BY MOUTH EVERY 6 HOURS AS NEEDED FOR WHEEZING OR  SHORTNESS  OF  BREATH., Disp: 9 g, Rfl: 5   aspirin  81 MG chewable tablet, Chew 1 tablet (81 mg total) by mouth 2 (two) times daily., Disp: , Rfl:    Budeson-Glycopyrrol-Formoterol  (BREZTRI  AEROSPHERE) 160-9-4.8 MCG/ACT AERO, Inhale 2 puffs into the lungs in the morning and at bedtime., Disp: 10.7 g, Rfl: 11   Cranberry 12600 MG CAPS, Take 1 tablet by mouth. daily, Disp: , Rfl:    diclofenac  Sodium (VOLTAREN  ARTHRITIS PAIN) 1 % GEL, Apply a small grape sized dollop 1 disorder 2  weeks of pends up to 4 times daily as needed, Disp: 150 g, Rfl: 1   docusate sodium  (COLACE) 250 MG capsule, Take 250 mg by mouth daily. Takes 4 daily, Disp: , Rfl:    esomeprazole  (NEXIUM ) 40 MG capsule, TAKE 1 CAPSULE BY MOUTH TWICE DAILY 30  MINUTES  BEFORE  BREAKFAST  AND  DINNER., Disp: 60 capsule, Rfl: 2   estradiol  (ESTRACE ) 0.5 MG tablet, Take 0.5 mg by mouth daily., Disp: , Rfl:    ezetimibe  (ZETIA ) 10 MG tablet, Take 1 tablet by mouth once daily, Disp: 90 tablet, Rfl: 3   famotidine (PEPCID) 20 MG tablet, Take 20 mg by mouth daily., Disp: , Rfl:    hydroxypropyl methylcellulose / hypromellose (ISOPTO TEARS / GONIOVISC) 2.5 % ophthalmic solution, Place 1 drop into both eyes 4 (four) times daily as needed for dry eyes., Disp: , Rfl:    ipratropium-albuterol  (DUONEB) 0.5-2.5 (3) MG/3ML SOLN, Take 3 mLs by nebulization every 6 (six) hours as needed., Disp: 360 mL, Rfl: 11   levocetirizine (XYZAL ) 5 MG tablet, Take 1 tablet (5 mg total) by mouth every evening., Disp: 30 tablet, Rfl: 3   levothyroxine  (SYNTHROID ) 112  MCG tablet, TAKE 1 TABLET BY MOUTH ONCE DAILY BEFORE BREAKFAST, Disp: 100 tablet, Rfl: 0   lubiprostone  (AMITIZA ) 24 MCG capsule, Take 1 capsule (24 mcg total) by mouth 2 (two) times daily with a meal., Disp: 180 capsule, Rfl: 3   magnesium  30 MG tablet, Take 400 mg by mouth daily., Disp: , Rfl:    metFORMIN  (GLUCOPHAGE -XR) 500 MG 24 hr tablet, Take 1 tablet (500 mg total) by mouth 2 (two) times daily with a meal., Disp: 180 tablet, Rfl: 3   potassium chloride  SA (KLOR-CON  M) 20 MEQ tablet, Take 1 daily., Disp: 90 tablet, Rfl: 3   Sennosides 17.2 MG TABS, Take 1 tablet by mouth. 4 capsules daily, Disp: , Rfl:   Observations/Objective: Patient is well-developed, well-nourished in no acute distress.  Resting comfortably at home.  Head is normocephalic, atraumatic.  No labored breathing.  Speech is clear and coherent with logical content.  Patient is alert and oriented at  baseline.    Assessment and Plan: 1. Acute bacterial sinusitis (Primary) - amoxicillin -clavulanate (AUGMENTIN ) 875-125 MG tablet; Take 1 tablet by mouth 2 (two) times daily.  Dispense: 14 tablet; Refill: 0  - Worsening symptoms that have not responded to OTC medications.  - Will give Augmentin  - Continue allergy medications.  - Steam and humidifier can help - Stay well hydrated and get plenty of rest.  - Seek in person evaluation if no symptom improvement or if symptoms worsen   Follow Up Instructions: I discussed the assessment and treatment plan with the patient. The patient was provided an opportunity to ask questions and all were answered. The patient agreed with the plan and demonstrated an understanding of the instructions.  A copy of instructions were sent to the patient via MyChart unless otherwise noted below.    The patient was advised to call back or seek an in-person evaluation if the symptoms worsen or if the condition fails to improve as anticipated.    [name], PA-C

## 2023-10-30 ENCOUNTER — Ambulatory Visit
Admission: RE | Admit: 2023-10-30 | Discharge: 2023-10-30 | Disposition: A | Discharge status: Home / Self Care | Source: Ambulatory Visit

## 2023-10-30 DIAGNOSIS — Z1231 Encounter for screening mammogram for malignant neoplasm of breast: Secondary | ICD-10-CM | POA: Diagnosis not present

## 2023-11-10 ENCOUNTER — Ambulatory Visit: Admitting: Family Medicine

## 2023-11-10 ENCOUNTER — Ambulatory Visit (INDEPENDENT_AMBULATORY_CARE_PROVIDER_SITE_OTHER)

## 2023-11-10 ENCOUNTER — Encounter: Payer: Self-pay | Admitting: Family Medicine

## 2023-11-10 VITALS — BP 118/78 | HR 110 | Ht 64.25 in | Wt 147.0 lb

## 2023-11-10 DIAGNOSIS — M533 Sacrococcygeal disorders, not elsewhere classified: Secondary | ICD-10-CM

## 2023-11-10 NOTE — Progress Notes (Signed)
   I, Megan Valentine, CMA acting as a Neurosurgeon for [name], MD.  Megan Valentine is a 73 y.o. female who presents to [facility] today for f/u sacrum/coccyx pain suspicious for a S5 fx. Pt was last seen by Dr. Lloyd on 10/12/23 and was advised to use a seat cushion and reduce weightbearing.  Today, pt reports improvement of pain since last visit. Continues to have some mid and lower back pain. Not having to use to cushion as often. Sx exacerbated by sitting on hard surface. Taking Tylenol  prn.   Dx testing: 10/12/23 Sacrum/coccyx XR  Pertinent review of systems: No fevers or chills  Relevant historical information: Hypothyroidism   Exam:  BP 118/78   Pulse (!) 110   Ht 5' 4.25 (1.632 m)   Wt 147 lb (66.7 kg)   SpO2 95%   BMI 25.04 kg/m  General: Well Developed, well nourished, and in no acute distress.   MSK: Sacrum normal motion nontender to palpation.    Lab and Radiology Results  X-ray images sacrum coccyx obtained today personally and independently interpreted. I do not appreciate a fracture of the sacrum. Await formal radiology review    Assessment and Plan: 73 y.o. female with possible sacrum fracture after a fall.  Radiology was concern for minimally displaced S5 sacral fracture with x-ray a month ago.  Today the x-ray on repeat looks okay to me.  She clinically is improving.  Plan to advance activity as tolerated and check back as needed.   PDMP not reviewed this encounter. Orders Placed This Encounter  Procedures   DG Sacrum/Coccyx    Standing Status:   Future    Number of Occurrences:   1    Expiration Date:   11/09/2024    Reason for Exam (SYMPTOM  OR DIAGNOSIS REQUIRED):   coccyx pain    Preferred imaging location?:   [place]   No orders of the defined types were placed in this encounter.    Discussed warning signs or symptoms. Please see discharge instructions. Patient expresses understanding.   The  above documentation has been reviewed and is accurate and complete Artist Megan Valentine, M.D.

## 2023-11-10 NOTE — Patient Instructions (Addendum)
 Thank you for coming in today.   Advance activity as tolerated.   See you back as needed.

## 2023-11-15 ENCOUNTER — Ambulatory Visit: Payer: Self-pay | Admitting: Family Medicine

## 2023-11-15 NOTE — Progress Notes (Signed)
 X-ray of the sacrum and coccyx shows interval healing of the nondisplaced S5 sacral fracture.

## 2023-11-27 ENCOUNTER — Other Ambulatory Visit: Payer: Self-pay | Admitting: Nurse Practitioner

## 2023-12-05 ENCOUNTER — Telehealth: Admitting: Physician Assistant

## 2023-12-05 DIAGNOSIS — J0100 Acute maxillary sinusitis, unspecified: Secondary | ICD-10-CM

## 2023-12-05 MED ORDER — CEFDINIR 300 MG PO CAPS: 300.0000 mg | ORAL_CAPSULE | Freq: Two times a day (BID) | ORAL | 0 refills | Status: DC

## 2023-12-05 MED ORDER — PREDNISONE 10 MG (21) PO TBPK: ORAL_TABLET | ORAL | 0 refills | Status: AC

## 2023-12-05 NOTE — Progress Notes (Signed)
 Virtual Visit Consent   Megan Valentine, you are scheduled for a virtual visit with a [facility] provider today. Just as with appointments in the office, your consent must be obtained to participate. Your consent will be active for this visit and any virtual visit you may have with one of our providers in the next 365 days. If you have a MyChart account, a copy of this consent can be sent to you electronically.  As this is a virtual visit, video technology does not allow for your provider to perform a traditional examination. This may limit your provider's ability to fully assess your condition. If your provider identifies any concerns that need to be evaluated in person or the need to arrange testing (such as labs, EKG, etc.), we will make arrangements to do so. Although advances in technology are sophisticated, we cannot ensure that it will always work on either your end or our end. If the connection with a video visit is poor, the visit may have to be switched to a telephone visit. With either a video or telephone visit, we are not always able to ensure that we have a secure connection.  By engaging in this virtual visit, you consent to the provision of healthcare and authorize for your insurance to be billed (if applicable) for the services provided during this visit. Depending on your insurance coverage, you may receive a charge related to this service.  I need to obtain your verbal consent now. Are you willing to proceed with your visit today? Megan Valentine has provided verbal consent on 12/05/2023 for a virtual visit (video or telephone). Megan Valentine, [place]  Date: 12/05/2023 12:38 PM   Virtual Visit via Video Note   I, Megan Valentine, connected with  Megan Valentine  ([id number], October 02, 1950) on 12/05/23 at 12:45 PM EST by a video-enabled telemedicine application and verified that I am speaking with the correct person using two identifiers.  Location: Patient: Virtual Visit  Location Patient: Home Provider: Virtual Visit Location Provider: Home Office   I discussed the limitations of evaluation and management by telemedicine and the availability of in person appointments. The patient expressed understanding and agreed to proceed.    History of Present Illness: Megan Valentine is a 73 y.o. who identifies as a female who was assigned female at birth, and is being seen today for nasal congestion, sinus pressure and sinus pain, mainly R-sided over the past 5-6 days. Unsure if mowing triggered it. Is highly prone to sinus infections, having similar issue with her L side last month. Denies fever, chills, SOB. Denies recent travel.   OTC -- Tylenol   HPI: HPI  Problems:  Patient Active Problem List   Diagnosis Date Noted   Grieving 09/12/2023   Elevated cholesterol 07/29/2022   Prediabetes 07/29/2022   DOE (dyspnea on exertion) 06/10/2022   Left hip pain 02/21/2022   Myalgia due to statin 01/04/2022   Arthritis of carpometacarpal [facility]) joint of both thumbs 09/07/2021   Carpal tunnel syndrome of right wrist 09/06/2021   Primary osteoarthritis of right hand 09/06/2021   Neuropathy 05/13/2021   Venous insufficiency 05/13/2021   Low back pain 04/05/2021   Acquired leg length discrepancy 04/05/2021   Urinary urgency 01/14/2021   Acute cystitis without hematuria 01/14/2021   Macrocytosis 12/07/2020   S/P TKR (total knee replacement) using cement, left 11/17/2020   Chronic pain of right knee 09/30/2020   Medication side effect 09/30/2020   Statin intolerance 06/30/2020  Trigger point of shoulder region, left 02/25/2020   B12 deficiency 08/15/2019   Elevated glucose 08/15/2019   Anemia 02/15/2019   Abdominal pain 12/14/2018   Sinus pressure 11/12/2018   History of 2019 novel coronavirus disease (COVID-19) 11/12/2018   Pleurisy 06/05/2018   Enterocele 06/05/2018   Rectocele 05/23/2018   Vaginal vault prolapse 05/23/2018   SUI (stress urinary incontinence,  female) 05/23/2018   Asthma 05/07/2018   Stage 3a chronic kidney disease (HCC) 01/15/2018   Edema 12/15/2017   Insomnia 11/20/2017   Seborrheic dermatitis 11/16/2017   Seasonal allergic rhinitis due to pollen 11/16/2017   Menopausal and female climacteric states 10/19/2017   Constipation 03/26/2015   Unilateral primary osteoarthritis, left knee 11/17/2014   Healthcare maintenance 04/25/2014   Cold hands and feet 09/15/2011   Hypokalemia 09/15/2011   Hypothyroidism 08/21/2008   Mixed hyperlipidemia 08/21/2008    Allergies:  Allergies  Allergen Reactions   Tetracycline Hives and Itching   Dilaudid  [Hydromorphone  Hcl] Nausea And Vomiting   Other     Cigarette smoke and perfumes--flares asthma (develops bronchitis)   Medications:  Current Outpatient Medications:    cefdinir (OMNICEF) 300 MG capsule, Take 1 capsule (300 mg total) by mouth 2 (two) times daily., Disp: 20 capsule, Rfl: 0   predniSONE  (STERAPRED UNI-PAK 21 TAB) 10 MG (21) TBPK tablet, Take following package directions, Disp: 21 tablet, Rfl: 0   acetaminophen  (TYLENOL ) 325 MG tablet, Take 2 tablets (650 mg total) by mouth every 6 (six) hours as needed for moderate pain., Disp: 30 tablet, Rfl: 0   albuterol  (VENTOLIN  HFA) 108 (90 Base) MCG/ACT inhaler, INHALE 1 TO 2 PUFFS BY MOUTH EVERY 6 HOURS AS NEEDED FOR WHEEZING OR  SHORTNESS  OF  BREATH., Disp: 9 g, Rfl: 5   aspirin  81 MG chewable tablet, Chew 1 tablet (81 mg total) by mouth 2 (two) times daily., Disp: , Rfl:    Budeson-Glycopyrrol-Formoterol  (BREZTRI  AEROSPHERE) 160-9-4.8 MCG/ACT AERO, Inhale 2 puffs into the lungs in the morning and at bedtime., Disp: 10.7 g, Rfl: 11   Cranberry 12600 MG CAPS, Take 1 tablet by mouth. daily, Disp: , Rfl:    diclofenac  Sodium (VOLTAREN  ARTHRITIS PAIN) 1 % GEL, Apply a small grape sized dollop 1 disorder 2 weeks of pends up to 4 times daily as needed, Disp: 150 g, Rfl: 1   docusate sodium  (COLACE) 250 MG capsule, Take 250 mg by mouth  daily. Takes 4 daily, Disp: , Rfl:    esomeprazole  (NEXIUM ) 40 MG capsule, TAKE 1 CAPSULE BY MOUTH TWICE DAILY 30 MINUTES BEFORE BREAKFAST AND DINNER, Disp: 60 capsule, Rfl: 0   estradiol  (ESTRACE ) 0.5 MG tablet, Take 0.5 mg by mouth daily., Disp: , Rfl:    ezetimibe  (ZETIA ) 10 MG tablet, Take 1 tablet by mouth once daily, Disp: 90 tablet, Rfl: 3   famotidine (PEPCID) 20 MG tablet, Take 20 mg by mouth daily., Disp: , Rfl:    hydroxypropyl methylcellulose / hypromellose (ISOPTO TEARS / GONIOVISC) 2.5 % ophthalmic solution, Place 1 drop into both eyes 4 (four) times daily as needed for dry eyes., Disp: , Rfl:    ipratropium-albuterol  (DUONEB) 0.5-2.5 (3) MG/3ML SOLN, Take 3 mLs by nebulization every 6 (six) hours as needed., Disp: 360 mL, Rfl: 11   levocetirizine (XYZAL ) 5 MG tablet, Take 1 tablet (5 mg total) by mouth every evening., Disp: 30 tablet, Rfl: 3   levothyroxine  (SYNTHROID ) 112 MCG tablet, TAKE 1 TABLET BY MOUTH ONCE DAILY BEFORE BREAKFAST, Disp: 100 tablet, Rfl:  0   lubiprostone  (AMITIZA ) 24 MCG capsule, Take 1 capsule (24 mcg total) by mouth 2 (two) times daily with a meal., Disp: 180 capsule, Rfl: 3   metFORMIN  (GLUCOPHAGE -XR) 500 MG 24 hr tablet, Take 1 tablet (500 mg total) by mouth 2 (two) times daily with a meal., Disp: 180 tablet, Rfl: 3   potassium chloride  SA (KLOR-CON  M) 20 MEQ tablet, Take 1 daily., Disp: 90 tablet, Rfl: 3   Sennosides 17.2 MG TABS, Take 1 tablet by mouth. 4 capsules daily, Disp: , Rfl:   Observations/Objective: Patient is well-developed, well-nourished in no acute distress.  Resting comfortably at home.  Head is normocephalic, atraumatic.  No labored breathing.  Speech is clear and coherent with logical content.  Patient is alert and oriented at baseline.   Assessment and Plan: 1. Acute non-recurrent maxillary sinusitis (Primary) - cefdinir (OMNICEF) 300 MG capsule; Take 1 capsule (300 mg total) by mouth 2 (two) times daily.  Dispense: 20 capsule;  Refill: 0 - predniSONE  (STERAPRED UNI-PAK 21 TAB) 10 MG (21) TBPK tablet; Take following package directions  Dispense: 21 tablet; Refill: 0  Rx Cefdinir.  Increase fluids.  Rest.  Saline nasal spray.  Probiotic.  Mucinex  as directed.  Humidifier in bedroom. Prednisone  per orders.  Call or return to clinic if symptoms are not improving.   Follow Up Instructions: I discussed the assessment and treatment plan with the patient. The patient was provided an opportunity to ask questions and all were answered. The patient agreed with the plan and demonstrated an understanding of the instructions.  A copy of instructions were sent to the patient via MyChart unless otherwise noted below.   The patient was advised to call back or seek an in-person evaluation if the symptoms worsen or if the condition fails to improve as anticipated.    Megan Velma Lunger, PA-C

## 2023-12-05 NOTE — Patient Instructions (Signed)
 Megan Valentine, thank you for joining Megan Velma Lunger, PA-C for today's virtual visit.  While this provider is not your primary care provider (PCP), if your PCP is located in our provider database this encounter information will be shared with them immediately following your visit.   A [facility] MyChart account gives you access to today's visit and all your visits, tests, and labs performed at [facility]  click here if you don't have a [facility] MyChart account or go to [email or web address]  Consent: (Patient) Megan Valentine provided verbal consent for this virtual visit at the beginning of the encounter.  Current Medications:  Current Outpatient Medications:    acetaminophen  (TYLENOL ) 325 MG tablet, Take 2 tablets (650 mg total) by mouth every 6 (six) hours as needed for moderate pain., Disp: 30 tablet, Rfl: 0   albuterol  (VENTOLIN  HFA) 108 (90 Base) MCG/ACT inhaler, INHALE 1 TO 2 PUFFS BY MOUTH EVERY 6 HOURS AS NEEDED FOR WHEEZING OR  SHORTNESS  OF  BREATH., Disp: 9 g, Rfl: 5   aspirin  81 MG chewable tablet, Chew 1 tablet (81 mg total) by mouth 2 (two) times daily., Disp: , Rfl:    Budeson-Glycopyrrol-Formoterol  (BREZTRI  AEROSPHERE) 160-9-4.8 MCG/ACT AERO, Inhale 2 puffs into the lungs in the morning and at bedtime., Disp: 10.7 g, Rfl: 11   Cranberry 12600 MG CAPS, Take 1 tablet by mouth. daily, Disp: , Rfl:    diclofenac  Sodium (VOLTAREN  ARTHRITIS PAIN) 1 % GEL, Apply a small grape sized dollop 1 disorder 2 weeks of pends up to 4 times daily as needed, Disp: 150 g, Rfl: 1   docusate sodium  (COLACE) 250 MG capsule, Take 250 mg by mouth daily. Takes 4 daily, Disp: , Rfl:    esomeprazole  (NEXIUM ) 40 MG capsule, TAKE 1 CAPSULE BY MOUTH TWICE DAILY 30 MINUTES BEFORE BREAKFAST AND DINNER, Disp: 60 capsule, Rfl: 0   estradiol  (ESTRACE ) 0.5 MG tablet, Take 0.5 mg by mouth daily., Disp: , Rfl:    ezetimibe  (ZETIA ) 10 MG tablet, Take 1 tablet by mouth once daily, Disp: 90  tablet, Rfl: 3   famotidine (PEPCID) 20 MG tablet, Take 20 mg by mouth daily., Disp: , Rfl:    hydroxypropyl methylcellulose / hypromellose (ISOPTO TEARS / GONIOVISC) 2.5 % ophthalmic solution, Place 1 drop into both eyes 4 (four) times daily as needed for dry eyes., Disp: , Rfl:    ipratropium-albuterol  (DUONEB) 0.5-2.5 (3) MG/3ML SOLN, Take 3 mLs by nebulization every 6 (six) hours as needed., Disp: 360 mL, Rfl: 11   levocetirizine (XYZAL ) 5 MG tablet, Take 1 tablet (5 mg total) by mouth every evening., Disp: 30 tablet, Rfl: 3   levothyroxine  (SYNTHROID ) 112 MCG tablet, TAKE 1 TABLET BY MOUTH ONCE DAILY BEFORE BREAKFAST, Disp: 100 tablet, Rfl: 0   lubiprostone  (AMITIZA ) 24 MCG capsule, Take 1 capsule (24 mcg total) by mouth 2 (two) times daily with a meal., Disp: 180 capsule, Rfl: 3   magnesium  30 MG tablet, Take 400 mg by mouth daily., Disp: , Rfl:    metFORMIN  (GLUCOPHAGE -XR) 500 MG 24 hr tablet, Take 1 tablet (500 mg total) by mouth 2 (two) times daily with a meal., Disp: 180 tablet, Rfl: 3   potassium chloride  SA (KLOR-CON  M) 20 MEQ tablet, Take 1 daily., Disp: 90 tablet, Rfl: 3   Sennosides 17.2 MG TABS, Take 1 tablet by mouth. 4 capsules daily, Disp: , Rfl:    Medications ordered in this encounter:  No orders of the defined types were  placed in this encounter.    *If you need refills on other medications prior to your next appointment, please contact your pharmacy*  Follow-Up: Call back or seek an in-person evaluation if the symptoms worsen or if the condition fails to improve as anticipated.  [facility] [phone]  Other Instructions Please take antibiotic as directed.  Increase fluid intake.  Use Saline nasal spray.  Take a daily multivitamin. Take the prednisone  as directed.  Place a humidifier in the bedroom.  Please call or return clinic if symptoms are not improving.  Sinusitis Sinusitis is redness, soreness, and swelling (inflammation) of the paranasal  sinuses. Paranasal sinuses are air pockets within the bones of your face (beneath the eyes, the middle of the forehead, or above the eyes). In healthy paranasal sinuses, mucus is able to drain out, and air is able to circulate through them by way of your nose. However, when your paranasal sinuses are inflamed, mucus and air can become trapped. This can allow bacteria and other germs to grow and cause infection. Sinusitis can develop quickly and last only a short time (acute) or continue over a long period (chronic). Sinusitis that lasts for more than 12 weeks is considered chronic.  CAUSES  Causes of sinusitis include: Allergies. Structural abnormalities, such as displacement of the cartilage that separates your nostrils (deviated septum), which can decrease the air flow through your nose and sinuses and affect sinus drainage. Functional abnormalities, such as when the small hairs (cilia) that line your sinuses and help remove mucus do not work properly or are not present. SYMPTOMS  Symptoms of acute and chronic sinusitis are the same. The primary symptoms are pain and pressure around the affected sinuses. Other symptoms include: Upper toothache. Earache. Headache. Bad breath. Decreased sense of smell and taste. A cough, which worsens when you are lying flat. Fatigue. Fever. Thick drainage from your nose, which often is green and may contain pus (purulent). Swelling and warmth over the affected sinuses. DIAGNOSIS  Your caregiver will perform a physical exam. During the exam, your caregiver may: Look in your nose for signs of abnormal growths in your nostrils (nasal polyps). Tap over the affected sinus to check for signs of infection. View the inside of your sinuses (endoscopy) with a special imaging device with a light attached (endoscope), which is inserted into your sinuses. If your caregiver suspects that you have chronic sinusitis, one or more of the following tests may be  recommended: Allergy tests. Nasal culture A sample of mucus is taken from your nose and sent to a lab and screened for bacteria. Nasal cytology A sample of mucus is taken from your nose and examined by your caregiver to determine if your sinusitis is related to an allergy. TREATMENT  Most cases of acute sinusitis are related to a viral infection and will resolve on their own within 10 days. Sometimes medicines are prescribed to help relieve symptoms (pain medicine, decongestants, nasal steroid sprays, or saline sprays).  However, for sinusitis related to a bacterial infection, your caregiver will prescribe antibiotic medicines. These are medicines that will help kill the bacteria causing the infection.  Rarely, sinusitis is caused by a fungal infection. In theses cases, your caregiver will prescribe antifungal medicine. For some cases of chronic sinusitis, surgery is needed. Generally, these are cases in which sinusitis recurs more than 3 times per year, despite other treatments. HOME CARE INSTRUCTIONS  Drink plenty of water. Water helps thin the mucus so your sinuses can  drain more easily. Use a humidifier. Inhale steam 3 to 4 times a day (for example, sit in the bathroom with the shower running). Apply a warm, moist washcloth to your face 3 to 4 times a day, or as directed by your caregiver. Use saline nasal sprays to help moisten and clean your sinuses. Take over-the-counter or prescription medicines for pain, discomfort, or fever only as directed by your caregiver. SEEK IMMEDIATE MEDICAL CARE IF: You have increasing pain or severe headaches. You have nausea, vomiting, or drowsiness. You have swelling around your face. You have vision problems. You have a stiff neck. You have difficulty breathing. MAKE SURE YOU:  Understand these instructions. Will watch your condition. Will get help right away if you are not doing well or get worse. Document Released: 01/10/2005 Document Revised:  04/04/2011 Document Reviewed: 01/25/2011 [facility] Patient Information 2014 [place], [place].    If you have been instructed to have an in-person evaluation today at a local Urgent Care facility, please use the link below. It will take you to a list of all of our available [facility], including address, phone number and hours of operation. Please do not delay care.  [facility]  If you or a family member do not have a primary care provider, use the link below to schedule a visit and establish care. When you choose a [facility] primary care physician or advanced practice provider, you gain a long-term partner in health. Find a Primary Care Provider  Learn more about [facility]'s in-office and virtual care options: [facility] - Get Care Now

## 2023-12-15 DIAGNOSIS — H40022 Open angle with borderline findings, high risk, left eye: Secondary | ICD-10-CM | POA: Diagnosis not present

## 2023-12-15 DIAGNOSIS — H40011 Open angle with borderline findings, low risk, right eye: Secondary | ICD-10-CM | POA: Diagnosis not present

## 2023-12-15 DIAGNOSIS — H16223 Keratoconjunctivitis sicca, not specified as Sjogren's, bilateral: Secondary | ICD-10-CM | POA: Diagnosis not present

## 2023-12-15 DIAGNOSIS — Z961 Presence of intraocular lens: Secondary | ICD-10-CM | POA: Diagnosis not present

## 2023-12-15 DIAGNOSIS — H40052 Ocular hypertension, left eye: Secondary | ICD-10-CM | POA: Diagnosis not present

## 2023-12-28 ENCOUNTER — Other Ambulatory Visit: Payer: Self-pay | Admitting: Physician Assistant

## 2024-01-17 ENCOUNTER — Other Ambulatory Visit: Payer: Self-pay | Admitting: Family Medicine

## 2024-01-17 DIAGNOSIS — E039 Hypothyroidism, unspecified: Secondary | ICD-10-CM

## 2024-01-29 ENCOUNTER — Ambulatory Visit: Payer: Self-pay

## 2024-01-29 NOTE — Telephone Encounter (Signed)
Left VM to rtn call. Dm/cma       

## 2024-01-29 NOTE — Telephone Encounter (Signed)
 FYI Only or Action Required?: Action required by provider: request for appointment and declines ED.  Patient was last seen in primary care on 12/05/2023 by Megan Elsie BROCKS, PA-C.  Called Nurse Triage reporting Cough.  Symptoms began several days ago.  Interventions attempted: Nothing.  Symptoms are: unchanged.  Triage Disposition: See HCP Within 4 Hours (Or PCP Triage)  Patient/caregiver understands and will follow disposition?: No, wishes to speak with PCP   Copied from CRM #[id number]. Topic: Clinical - Red Word Triage >> Jan 29, 2024  4:20 PM Shereese L wrote: Body temp 90 degrees and symptoms of bronchitis Reason for Disposition  [1] Fever > 101 F (38.3 C) AND [2] age > 60 years    Low temp; advised UC/ED; patient declines ED  Answer Assessment - Initial Assessment Questions No available appts within 4 hours. Advised UC/ED now and 911 if symptoms worsen. Patient verbalized understanding. Patient declines ED.  Patient requests appt tomorrow.  1. ONSET: When did the cough begin?      Saturday; temp 92.8, today 92.0 checked orally with digital thermometer. Patient does not have any other thermometers; advised ED if low temp. 2. SEVERITY: How bad is the cough today?      moderate 3. SPUTUM: Describe the color of your sputum (e.g., none, dry cough; clear, white, yellow, green)     no 4. HEMOPTYSIS: Are you coughing up any blood? If Yes, ask: How much? (e.g., flecks, streaks, tablespoons, etc.)     no 5. DIFFICULTY BREATHING: Are you having difficulty breathing? If Yes, ask: How bad is it? (e.g., mild, moderate, severe)      no 6. FEVER: Do you have a fever? If Yes, ask: What is your temperature, how was it measured, and when did it start?     Reports low temp Fever, chills, n/v/d 7. CARDIAC HISTORY: Do you have any history of heart disease? (e.g., heart attack, congestive heart failure)      no 8. LUNG HISTORY: Do you have any history of lung disease?   (e.g., pulmonary embolus, asthma, emphysema)     Asthma, bronchitis, pneumonia 9. PE RISK FACTORS: Do you have a history of blood clots? (or: recent major surgery, recent prolonged travel, bedridden)     no 10. OTHER SYMPTOMS: Do you have any other symptoms? (e.g., runny nose, wheezing, chest pain)     Sore throat, runny nose  Denies chest pain, diff breathing, faint  12. TRAVEL: Have you traveled out of the country in the last month? (e.g., travel history, exposures)       unsure  Protocols used: Cough - Acute Non-Productive-A-AH

## 2024-01-29 NOTE — Telephone Encounter (Signed)
 Called patient and she will go to an Urgent Care in the morning. Dm/cma

## 2024-01-30 ENCOUNTER — Ambulatory Visit (HOSPITAL_COMMUNITY)
Admission: RE | Admit: 2024-01-30 | Discharge: 2024-01-30 | Disposition: A | Discharge status: Home / Self Care | Source: Ambulatory Visit | Attending: Emergency Medicine | Admitting: Emergency Medicine

## 2024-01-30 ENCOUNTER — Encounter (HOSPITAL_COMMUNITY): Payer: Self-pay

## 2024-01-30 ENCOUNTER — Encounter: Payer: Self-pay | Admitting: Family Medicine

## 2024-01-30 ENCOUNTER — Ambulatory Visit (HOSPITAL_COMMUNITY): Payer: Self-pay | Admitting: Emergency Medicine

## 2024-01-30 ENCOUNTER — Ambulatory Visit (HOSPITAL_COMMUNITY)

## 2024-01-30 VITALS — BP 141/91 | HR 84 | Temp 98.3°F | Resp 16

## 2024-01-30 DIAGNOSIS — E876 Hypokalemia: Secondary | ICD-10-CM | POA: Insufficient documentation

## 2024-01-30 DIAGNOSIS — R059 Cough, unspecified: Secondary | ICD-10-CM | POA: Insufficient documentation

## 2024-01-30 LAB — CBC WITH DIFFERENTIAL/PLATELET
Abs Immature Granulocytes: 0.02 K/uL (ref 0.00–0.07)
Basophils Absolute: 0.1 K/uL (ref 0.0–0.1)
Basophils Relative: 1 %
Eosinophils Absolute: 0.2 K/uL (ref 0.0–0.5)
Eosinophils Relative: 3 %
HCT: 44 % (ref 36.0–46.0)
Hemoglobin: 14.6 g/dL (ref 12.0–15.0)
Immature Granulocytes: 0 %
Lymphocytes Relative: 28 %
Lymphs Abs: 2.2 K/uL (ref 0.7–4.0)
MCH: 31.4 pg (ref 26.0–34.0)
MCHC: 33.2 g/dL (ref 30.0–36.0)
MCV: 94.6 fL (ref 80.0–100.0)
Monocytes Absolute: 0.8 K/uL (ref 0.1–1.0)
Monocytes Relative: 11 %
Neutro Abs: 4.5 K/uL (ref 1.7–7.7)
Neutrophils Relative %: 57 %
Platelets: 410 K/uL — ABNORMAL HIGH (ref 150–400)
RBC: 4.65 MIL/uL (ref 3.87–5.11)
RDW: 13.8 % (ref 11.5–15.5)
WBC: 7.9 K/uL (ref 4.0–10.5)
nRBC: 0 % (ref 0.0–0.2)

## 2024-01-30 LAB — COMPREHENSIVE METABOLIC PANEL WITH GFR
ALT: 18 U/L (ref 0–44)
AST: 24 U/L (ref 15–41)
Albumin: 4.3 g/dL (ref 3.5–5.0)
Alkaline Phosphatase: 83 U/L (ref 38–126)
Anion gap: 12 (ref 5–15)
BUN: 13 mg/dL (ref 8–23)
CO2: 24 mmol/L (ref 22–32)
Calcium: 9.5 mg/dL (ref 8.9–10.3)
Chloride: 106 mmol/L (ref 98–111)
Creatinine, Ser: 0.73 mg/dL (ref 0.44–1.00)
GFR, Estimated: >60 mL/min
Glucose, Bld: 75 mg/dL (ref 70–99)
Potassium: 3.6 mmol/L (ref 3.5–5.1)
Sodium: 142 mmol/L (ref 135–145)
Total Bilirubin: 0.3 mg/dL (ref 0.0–1.2)
Total Protein: 7 g/dL (ref 6.5–8.1)

## 2024-01-30 MED ORDER — CEFDINIR 300 MG PO CAPS: 300.0000 mg | ORAL_CAPSULE | Freq: Two times a day (BID) | ORAL | 0 refills | Status: AC

## 2024-01-30 NOTE — ED Triage Notes (Signed)
 Pt states she think she has bronchitis again. Her temp has been lower than normal, she has cough, congestion, ear pain, chest tightness X 1 week. She states she has had ac couple sinus infections that she was treated for on virtual visits she isn't sure if that isn't cleared.   She called here PCP yesterday and was advised to go to ED but pt declined.   She states she stop taking her potassium x 1 month since she was feeling good. She did take 2 tablet yesterday and 2 today.

## 2024-01-30 NOTE — Discharge Instructions (Addendum)
 We are re-checking your potassium today in clinic, take your potassium as prescribed, once daily, unless otherwise advised to stop  Take antibiotics twice daily with food  Continue OTC treatment for congestion  Get a new thermometer, do not drink anything hot or cold 10 minutes before taking oral temp   Follow-up with your primary care provider as needed, return to clinic for new or urgent symptoms

## 2024-01-30 NOTE — ED Provider Notes (Signed)
 " [facility]    CSN: [id number] Arrival date & time: 01/30/24  1118      History   Chief Complaint Chief Complaint  Patient presents with   Cough    Low temp of 92, chest congestion like bronchitis, pain under right arm in the gland - Entered by patient   Nasal Congestion   Chest Pain    HPI Megan Valentine is a 74 y.o. female.   Patient presents to clinic over concern of right-sided ear pain, right sided congestion, right-sided sore throat and right-sided chest tightness for the past week or so.  She has had a cough that has been dry and nonproductive.  Does have a history of asthma.  Has not had wheezing or shortness of breath and has not had to use her albuterol  inhaler.  Over the weekend (4 days ago) she did get some oral temperature measurements that were abnormally low, around 92 degrees orally.  She was very cold and wrapped up in multiple blankets.  She was concerned so she reached out to her doctor yesterday but they did not have any appointments.  She stopped her oral potassium pills around a month ago.  Her last potassium that was checked was 5 months ago and was critically low at 2.6.  She googled her symptoms and found that hypokalemia may cause hypothermia so she restarted these yesterday and has been taking 2 daily.  Prescribed once per day.  Has not had her potassium rechecked within the last 5 months.   The history is provided by the patient and medical records.  Cough Chest Pain   Past Medical History:  Diagnosis Date   Allergy    Arthritis    Asthma    Cataract    Chronic bronchitis (HCC)    Chronic kidney disease    stage 3, kidney infections   Complication of anesthesia    hard to wake up   Diverticulosis    Environmental allergies    GERD (gastroesophageal reflux disease)    Hiatal hernia    History of chicken pox    Hyperlipidemia    Hypothyroidism    IBS (irritable bowel syndrome)    Migraines    Mitral valve prolapse    Per pt  . per Dr.Henry  [name].  does not have MVP   Neuromuscular disorder (HCC)    neuropathy feet   Pneumonia    Rectal prolapse     Patient Active Problem List   Diagnosis Date Noted   Grieving 09/12/2023   Elevated cholesterol 07/29/2022   Prediabetes 07/29/2022   DOE (dyspnea on exertion) 06/10/2022   Left hip pain 02/21/2022   Myalgia due to statin 01/04/2022   Arthritis of carpometacarpal (CMC) joint of both thumbs 09/07/2021   Carpal tunnel syndrome of right wrist 09/06/2021   Primary osteoarthritis of right hand 09/06/2021   Neuropathy 05/13/2021   Venous insufficiency 05/13/2021   Low back pain 04/05/2021   Acquired leg length discrepancy 04/05/2021   Urinary urgency 01/14/2021   Acute cystitis without hematuria 01/14/2021   Macrocytosis 12/07/2020   S/P TKR (total knee replacement) using cement, left 11/17/2020   Chronic pain of right knee 09/30/2020   Medication side effect 09/30/2020   Statin intolerance 06/30/2020   Trigger point of shoulder region, left 02/25/2020   B12 deficiency 08/15/2019   Elevated glucose 08/15/2019   Anemia 02/15/2019   Abdominal pain 12/14/2018   Sinus pressure 11/12/2018   History of 2019 novel coronavirus  disease (COVID-19) 11/12/2018   Pleurisy 06/05/2018   Enterocele 06/05/2018   Rectocele 05/23/2018   Vaginal vault prolapse 05/23/2018   SUI (stress urinary incontinence, female) 05/23/2018   Asthma 05/07/2018   Stage 3a chronic kidney disease (HCC) 01/15/2018   Edema 12/15/2017   Insomnia 11/20/2017   Seborrheic dermatitis 11/16/2017   Seasonal allergic rhinitis due to pollen 11/16/2017   Menopausal and female climacteric states 10/19/2017   Constipation 03/26/2015   Unilateral primary osteoarthritis, left knee 11/17/2014   Healthcare maintenance 04/25/2014   Cold hands and feet 09/15/2011   Hypokalemia 09/15/2011   Hypothyroidism 08/21/2008   Mixed hyperlipidemia 08/21/2008    Past Surgical History:  Procedure Laterality  Date   ABDOMINAL HYSTERECTOMY  1999   APPENDECTOMY  1962   BREAST CYST ASPIRATION Bilateral    CHOLECYSTECTOMY N/A 12/17/2018   Procedure: LAPAROSCOPIC CHOLECYSTECTOMY;  Surgeon: Signe Mitzie LABOR, MD;  Location: [facility];  Service: General;  Laterality: N/A;   COLONOSCOPY     EYE SURGERY     cataract   JOINT REPLACEMENT     rt knee scope   REFRACTIVE SURGERY  2000   both eyes   retrocele repair     and bladder sling   TOTAL KNEE ARTHROPLASTY Right 03/24/2015   Procedure: TOTAL KNEE ARTHROPLASTY;  Surgeon: Maude LELON Right, MD;  Location: [facility];  Service: Orthopedics;  Laterality: Right;   TOTAL KNEE ARTHROPLASTY Left 11/17/2020   Procedure: LEFT TOTAL KNEE ARTHROPLASTY;  Surgeon: Right Maude LELON, MD;  Location: [facility];  Service: Orthopedics;  Laterality: Left;   TUBAL LIGATION  1977   UPPER GASTROINTESTINAL ENDOSCOPY      OB History   No obstetric history on file.      Home Medications    Prior to Admission medications  Medication Sig Start Date End Date Taking? Authorizing Provider  aspirin  81 MG chewable tablet Chew 1 tablet (81 mg total) by mouth 2 (two) times daily. 11/19/20  Yes Petrarca, Redell BIRCH, PA-C  Budeson-Glycopyrrol-Formoterol  (BREZTRI  AEROSPHERE) 160-9-4.8 MCG/ACT AERO Inhale 2 puffs into the lungs in the morning and at bedtime. 09/30/22  Yes Mannam, Praveen, MD  Cranberry 12600 MG CAPS Take 1 tablet by mouth. daily   Yes [provider]  esomeprazole  (NEXIUM ) 40 MG capsule TAKE 1 CAPSULE BY MOUTH TWICE DAILY 30  MINUTES  BEFORE  BREAKFAST  AND  DINNER 12/28/23  Yes [place], PA-C  estradiol  (ESTRACE ) 0.5 MG tablet Take 0.5 mg by mouth daily.   Yes [provider]  ezetimibe  (ZETIA ) 10 MG tablet Take 1 tablet by mouth once daily 06/27/23  Yes Berneta Elsie Sayre, MD  famotidine (PEPCID) 20 MG tablet Take 20 mg by mouth daily.   Yes [provider]  hydroxypropyl methylcellulose / hypromellose (ISOPTO TEARS / GONIOVISC) 2.5 %  ophthalmic solution Place 1 drop into both eyes 4 (four) times daily as needed for dry eyes.   Yes [provider]  levocetirizine (XYZAL ) 5 MG tablet Take 1 tablet (5 mg total) by mouth every evening. 07/17/23  Yes Berneta Elsie Sayre, MD  levothyroxine  (SYNTHROID ) 112 MCG tablet TAKE 1 TABLET BY MOUTH ONCE DAILY BEFORE BREAKFAST 01/17/24  Yes Berneta Elsie Sayre, MD  lubiprostone  (AMITIZA ) 24 MCG capsule Take 1 capsule (24 mcg total) by mouth 2 (two) times daily with a meal. 10/20/23 10/14/24 Yes [place], PA-C  metFORMIN  (GLUCOPHAGE -XR) 500 MG 24 hr tablet Take 1 tablet (500 mg total) by mouth 2 (two) times daily with a  meal. 03/02/23  Yes Berneta Elsie Sayre, MD  potassium chloride  SA (KLOR-CON  M) 20 MEQ tablet Take 1 daily. 09/12/23  Yes Berneta Elsie Sayre, MD  acetaminophen  (TYLENOL ) 325 MG tablet Take 2 tablets (650 mg total) by mouth every 6 (six) hours as needed for moderate pain. 03/03/21   Christopher [place], PA-C  albuterol  (VENTOLIN  HFA) 108 (90 Base) MCG/ACT inhaler INHALE 1 TO 2 PUFFS BY MOUTH EVERY 6 HOURS AS NEEDED FOR WHEEZING OR  SHORTNESS  OF  BREATH. 02/17/23   Mannam, Praveen, MD  cefdinir  (OMNICEF ) 300 MG capsule Take 1 capsule (300 mg total) by mouth 2 (two) times daily for 7 days. 01/30/24 02/06/24 Yes Genelda Roark  N, FNP  diclofenac  Sodium (VOLTAREN  ARTHRITIS PAIN) 1 % GEL Apply a small grape sized dollop 1 disorder 2 weeks of pends up to 4 times daily as needed 09/06/21   Berneta Elsie Sayre, MD  docusate sodium  (COLACE) 250 MG capsule Take 250 mg by mouth daily. Takes 4 daily    [provider]  ipratropium-albuterol  (DUONEB) 0.5-2.5 (3) MG/3ML SOLN Take 3 mLs by nebulization every 6 (six) hours as needed. 08/03/22   Mannam, Praveen, MD  predniSONE  (STERAPRED UNI-PAK 21 TAB) 10 MG (21) TBPK tablet Take following package directions 12/05/23   Gladis Elsie BROCKS, PA-C  Sennosides 17.2 MG TABS Take 1 tablet by mouth. 4 capsules daily    [provider]    Family History Family History  Problem Relation Age of Onset   Hyperlipidemia Mother    Diabetes Mother    Hypertension Mother    Alzheimer's disease Mother    Diabetes Father    Hyperlipidemia Father    Heart disease Father    Heart attack Father    Stomach cancer Maternal Grandmother    Hypertension Maternal Grandmother    Diabetes Maternal Grandmother    Stroke Maternal Grandmother    Cancer Maternal Grandmother        Stomach cancer   Stroke Paternal Grandmother    Colon cancer Paternal Grandfather    Heart attack Paternal Grandfather    Breast cancer Neg Hx     Social History Social History[1]   Allergies   Tetracycline, Dilaudid  [hydromorphone  hcl], and Other   Review of Systems Review of Systems  Per HPI  Physical Exam Triage Vital Signs ED Triage Vitals  Encounter Vitals Group     BP 01/30/24 1221 (!) 141/91     Girls Systolic BP Percentile --      Girls Diastolic BP Percentile --      Boys Systolic BP Percentile --      Boys Diastolic BP Percentile --      Pulse Rate 01/30/24 1221 84     Resp 01/30/24 1221 16     Temp 01/30/24 1221 98.3 F (36.8 C)     Temp Source 01/30/24 1221 Oral     SpO2 01/30/24 1221 98 %     Weight --      Height --      Head Circumference --      Peak Flow --      Pain Score 01/30/24 1219 5     Pain Loc --      Pain Education --      Exclude from Growth Chart --    No data found.  Updated Vital Signs BP (!) 141/91 (BP Location: Right Arm)   Pulse 84   Temp 98.3 F (36.8 C) (Oral)   Resp 16  SpO2 98%   Visual Acuity Right Eye Distance:   Left Eye Distance:   Bilateral Distance:    Right Eye Near:   Left Eye Near:    Bilateral Near:     Physical Exam Vitals and nursing note reviewed.  Constitutional:      Appearance: Normal appearance.  HENT:     Head: Normocephalic and atraumatic.     Right Ear: Tympanic membrane, ear canal and external ear normal.     Left Ear: Tympanic  membrane, ear canal and external ear normal.     Nose: Nose normal.     Mouth/Throat:     Mouth: Mucous membranes are moist.     Pharynx: Posterior oropharyngeal erythema present.  Eyes:     Conjunctiva/sclera: Conjunctivae normal.  Cardiovascular:     Rate and Rhythm: Normal rate and regular rhythm.     Heart sounds: Normal heart sounds. No murmur heard. Skin:    General: Skin is warm and dry.  Neurological:     General: No focal deficit present.     Mental Status: She is alert.  Psychiatric:        Mood and Affect: Mood normal.      UC Treatments / Results  Labs (all labs ordered are listed, but only abnormal results are displayed) Labs Reviewed  CBC WITH DIFFERENTIAL/PLATELET  COMPREHENSIVE METABOLIC PANEL WITH GFR    EKG   Radiology No results found.  Procedures Procedures (including critical care time)  Medications Ordered in UC Medications - No data to display  Initial Impression / Assessment and Plan / UC Course  I have reviewed the triage vital signs and the nursing notes.  Pertinent labs & imaging results that were available during my care of the patient were reviewed by me and considered in my medical decision making (see chart for details).  Vitals and triage reviewed, patient is hemodynamically stable.  Lungs vesicular, heart with regular rate and rhythm.  Posterior pharynx erythema.  Bilateral tympanic membranes are pearly gray with good cone of light.  No evidence of otitis media.  Ongoing struggles with bacterial sinusitis.  Symptoms have been present for the past week.  Will trial cefdinir  to cover for bacterial etiology.  Encourage potassium supplementation as prescribed.  Will recheck CBC and CMP today in clinic.  Plan of care, follow-up care and strict emergency precautions given, no questions at this time.    Final Clinical Impressions(s) / UC Diagnoses   Final diagnoses:  Cough, unspecified type  Hypokalemia     Discharge  Instructions      We are re-checking your potassium today in clinic, take your potassium as prescribed, once daily, unless otherwise advised to stop  Take antibiotics twice daily with food  Continue OTC treatment for congestion  Get a new thermometer, do not drink anything hot or cold 10 minutes before taking oral temp   Follow-up with your primary care provider as needed, return to clinic for new or urgent symptoms       ED Prescriptions     Medication Sig Dispense Auth. Provider   cefdinir  (OMNICEF ) 300 MG capsule Take 1 capsule (300 mg total) by mouth 2 (two) times daily for 7 days. 14 capsule Dreama, Makayla Lanter  N, FNP      PDMP not reviewed this encounter.     [1]  Social History Tobacco Use   Smoking status: Former    Current packs/day: 0.00    Types: Cigarettes    Quit date: 03/23/1976  Years since quitting: 47.8   Smokeless tobacco: Never  Vaping Use   Vaping status: Never Used  Substance Use Topics   Alcohol  use: No   Drug use: No     Dreama Sung SAILOR, FNP 01/30/24 1253  "

## 2024-01-31 NOTE — Telephone Encounter (Signed)
 I went to urgent care today. They did some blood tests, potassium is a little low, I started my potassium tab again yesterday, I hadn't taken it in a month.  My platelets are 10 points over high, don't know what to do about that. Can you take a look at my results? Thanks, [employer] 02/[birth year]/50

## 2024-02-05 ENCOUNTER — Ambulatory Visit: Payer: PPO

## 2024-02-05 VITALS — Ht 64.0 in | Wt 138.0 lb

## 2024-02-05 DIAGNOSIS — Z0000 Encounter for general adult medical examination without abnormal findings: Secondary | ICD-10-CM | POA: Diagnosis not present

## 2024-02-05 NOTE — Patient Instructions (Signed)
 Megan Valentine,  Thank you for taking the time for your Medicare Wellness Visit. I appreciate your continued commitment to your health goals. Please review the care plan we discussed, and feel free to reach out if I can assist you further.  Please note that Annual Wellness Visits do not include a physical exam. Some assessments may be limited, especially if the visit was conducted virtually. If needed, we may recommend an in-person follow-up with your provider.  Ongoing Care Seeing your primary care provider every 3 to 6 months helps us  monitor your health and provide consistent, personalized care.   Referrals If a referral was made during today's visit and you haven't received any updates within two weeks, please contact the referred provider directly to check on the status.  Recommended Screenings:  Health Maintenance  Topic Date Due   Medicare Annual Wellness Visit  01/30/2024   Breast Cancer Screening  10/29/2024   Colon Cancer Screening  11/30/2024   DTaP/Tdap/Td vaccine (5 - Td or Tdap) 03/27/2033   Pneumococcal Vaccine for age over 74  Completed   Flu Shot  Completed   Osteoporosis screening with Bone Density Scan  Completed   Hepatitis C Screening  Completed   Zoster (Shingles) Vaccine  Completed   Meningitis B Vaccine  Aged Out   COVID-19 Vaccine  Discontinued       01/30/2023    4:22 PM  Advanced Directives  Does Patient Have a Medical Advance Directive? Yes  Type of Estate Agent of [place];Living will  Copy of Healthcare Power of Attorney in Chart? Yes - validated most recent copy scanned in chart (See row information)    Vision: Annual vision screenings are recommended for early detection of glaucoma, cataracts, and diabetic retinopathy. These exams can also reveal signs of chronic conditions such as diabetes and high blood pressure.  Dental: Annual dental screenings help detect early signs of oral cancer, gum disease, and other conditions linked  to overall health, including heart disease and diabetes.  Please see the attached documents for additional preventive care recommendations.

## 2024-02-05 NOTE — Progress Notes (Signed)
 "  Chief Complaint  Patient presents with   Medicare Wellness     Subjective:   Megan Valentine is a 74 y.o. female who presents for a Medicare Annual Wellness Visit.  Visit info / Clinical Intake: Medicare Wellness Visit Type:: Subsequent Annual Wellness Visit Persons participating in visit and providing information:: patient Medicare Wellness Visit Mode:: Video Since this visit was completed virtually, some vitals may be partially provided or unavailable. Missing vitals are due to the limitations of the virtual format.: Documented vitals are patient reported If Telephone or Video please confirm:: I connected with patient using audio/video enable telemedicine. I verified patient identity with two identifiers, discussed telehealth limitations, and patient agreed to proceed. Patient Location:: home Provider Location:: office Interpreter Needed?: No Pre-visit prep was completed: yes AWV questionnaire completed by patient prior to visit?: yes Date:: 02/05/24 Living arrangements:: (!) (Patient-Rptd) lives alone Patient's Overall Health Status Rating: (Patient-Rptd) good Typical amount of pain: (Patient-Rptd) some Does pain affect daily life?: (Patient-Rptd) no Are you currently prescribed opioids?: no  Dietary Habits and Nutritional Risks How many meals a day?: (Patient-Rptd) 2 Eats fruit and vegetables daily?: (Patient-Rptd) yes Most meals are obtained by: (Patient-Rptd) eating out In the last 2 weeks, have you had any of the following?: none  Functional Status Activities of Daily Living (to include ambulation/medication): (Patient-Rptd) Independent Ambulation: (Patient-Rptd) Independent Medication Administration: (Patient-Rptd) Independent Home Management (perform basic housework or laundry): (Patient-Rptd) Independent Manage your own finances?: (Patient-Rptd) yes Primary transportation is: (Patient-Rptd) driving Concerns about vision?: no *vision screening is required for  WTM* Concerns about hearing?: no  Fall Screening Falls in the past year?: 1 (slipped on pet throw up) Number of falls in past year: (Patient-Rptd) 0 Was there an injury with Fall?: 1 (broke vertebrae) Fall Risk Category Calculator: (Patient-Rptd) 2 Patient Fall Risk Level: (Patient-Rptd) Moderate Fall Risk  Fall Risk Patient at Risk for Falls Due to: Medication side effect Fall risk Follow up: Falls prevention discussed; Falls evaluation completed  Home and Transportation Safety: All rugs have non-skid backing?: (Patient-Rptd) yes All stairs or steps have railings?: (Patient-Rptd) N/A, no stairs Grab bars in the bathtub or shower?: (Patient-Rptd) yes Have non-skid surface in bathtub or shower?: (Patient-Rptd) yes Good home lighting?: (Patient-Rptd) yes Regular seat belt use?: (Patient-Rptd) yes Hospital stays in the last year:: (Patient-Rptd) no  Cognitive Assessment Difficulty concentrating, remembering, or making decisions? : (Patient-Rptd) no Will 6CIT or Mini Cog be Completed: yes What year is it?: 0 points What month is it?: 0 points Give patient an address phrase to remember (5 components): [address] [place] About what time is it?: 0 points Count backwards from 20 to 1: 0 points Say the months of the year in reverse: 2 points Repeat the address phrase from earlier: 4 points 6 CIT Score: 6 points  Reviewed/Updated  Reviewed/Updated: Reviewed All (Medical, Surgical, Family, Medications, Allergies, Care Teams, Patient Goals)    Allergies (verified) Tetracycline, Dilaudid  [hydromorphone  hcl], and Other   Current Medications (verified) Outpatient Encounter Medications as of 02/05/2024  Medication Sig   acetaminophen  (TYLENOL ) 325 MG tablet Take 2 tablets (650 mg total) by mouth every 6 (six) hours as needed for moderate pain.   albuterol  (VENTOLIN  HFA) 108 (90 Base) MCG/ACT inhaler INHALE 1 TO 2 PUFFS BY MOUTH EVERY 6 HOURS AS NEEDED FOR WHEEZING OR  SHORTNESS   OF  BREATH.   aspirin  81 MG chewable tablet Chew 1 tablet (81 mg total) by mouth 2 (two) times daily.  Budeson-Glycopyrrol-Formoterol  (BREZTRI  AEROSPHERE) 160-9-4.8 MCG/ACT AERO Inhale 2 puffs into the lungs in the morning and at bedtime.   cefdinir  (OMNICEF ) 300 MG capsule Take 1 capsule (300 mg total) by mouth 2 (two) times daily for 7 days.   Cranberry 12600 MG CAPS Take 1 tablet by mouth. daily   diclofenac  Sodium (VOLTAREN  ARTHRITIS PAIN) 1 % GEL Apply a small grape sized dollop 1 disorder 2 weeks of pends up to 4 times daily as needed   docusate sodium  (COLACE) 250 MG capsule Take 250 mg by mouth daily. Takes 4 daily   esomeprazole  (NEXIUM ) 40 MG capsule TAKE 1 CAPSULE BY MOUTH TWICE DAILY 30  MINUTES  BEFORE  BREAKFAST  AND  DINNER   estradiol  (ESTRACE ) 0.5 MG tablet Take 0.5 mg by mouth daily.   ezetimibe  (ZETIA ) 10 MG tablet Take 1 tablet by mouth once daily   famotidine (PEPCID) 20 MG tablet Take 20 mg by mouth daily.   hydroxypropyl methylcellulose / hypromellose (ISOPTO TEARS / GONIOVISC) 2.5 % ophthalmic solution Place 1 drop into both eyes 4 (four) times daily as needed for dry eyes.   ipratropium-albuterol  (DUONEB) 0.5-2.5 (3) MG/3ML SOLN Take 3 mLs by nebulization every 6 (six) hours as needed.   levocetirizine (XYZAL ) 5 MG tablet Take 1 tablet (5 mg total) by mouth every evening.   levothyroxine  (SYNTHROID ) 112 MCG tablet TAKE 1 TABLET BY MOUTH ONCE DAILY BEFORE BREAKFAST   lubiprostone  (AMITIZA ) 24 MCG capsule Take 1 capsule (24 mcg total) by mouth 2 (two) times daily with a meal.   metFORMIN  (GLUCOPHAGE -XR) 500 MG 24 hr tablet Take 1 tablet (500 mg total) by mouth 2 (two) times daily with a meal.   potassium chloride  SA (KLOR-CON  M) 20 MEQ tablet Take 1 daily.   predniSONE  (STERAPRED UNI-PAK 21 TAB) 10 MG (21) TBPK tablet Take following package directions   Sennosides 17.2 MG TABS Take 1 tablet by mouth. 4 capsules daily   No facility-administered encounter medications on  file as of 02/05/2024.    History: Past Medical History:  Diagnosis Date   Allergy    Arthritis    Asthma    Cataract    Chronic bronchitis (HCC)    Chronic kidney disease    stage 3, kidney infections   Complication of anesthesia    hard to wake up   Diverticulosis    Environmental allergies    GERD (gastroesophageal reflux disease)    Hiatal hernia    History of chicken pox    Hyperlipidemia    Hypothyroidism    IBS (irritable bowel syndrome)    Migraines    Mitral valve prolapse    Per pt . per Dr.Henry  [name].  does not have MVP   Neuromuscular disorder (HCC)    neuropathy feet   Pneumonia    Rectal prolapse    Past Surgical History:  Procedure Laterality Date   ABDOMINAL HYSTERECTOMY  1999   APPENDECTOMY  1962   BREAST CYST ASPIRATION Bilateral    CHOLECYSTECTOMY N/A 12/17/2018   Procedure: LAPAROSCOPIC CHOLECYSTECTOMY;  Surgeon: Signe Mitzie LABOR, MD;  Location: [facility] OR;  Service: General;  Laterality: N/A;   COLONOSCOPY     EYE SURGERY     cataract   JOINT REPLACEMENT     rt knee scope   REFRACTIVE SURGERY  2000   both eyes   retrocele repair     and bladder sling   TOTAL KNEE ARTHROPLASTY Right 03/24/2015   Procedure: TOTAL KNEE ARTHROPLASTY;  Surgeon: Maude LELON Right, MD;  Location: [facility] OR;  Service: Orthopedics;  Laterality: Right;   TOTAL KNEE ARTHROPLASTY Left 11/17/2020   Procedure: LEFT TOTAL KNEE ARTHROPLASTY;  Surgeon: Right Maude LELON, MD;  Location: [facility] ORS;  Service: Orthopedics;  Laterality: Left;   TUBAL LIGATION  1977   UPPER GASTROINTESTINAL ENDOSCOPY     Family History  Problem Relation Age of Onset   Hyperlipidemia Mother    Diabetes Mother    Hypertension Mother    Alzheimer's disease Mother    Diabetes Father    Hyperlipidemia Father    Heart disease Father    Heart attack Father    Stomach cancer Maternal Grandmother    Hypertension Maternal Grandmother    Diabetes Maternal Grandmother    Stroke Maternal Grandmother     Cancer Maternal Grandmother        Stomach cancer   Stroke Paternal Grandmother    Colon cancer Paternal Grandfather    Heart attack Paternal Grandfather    Breast cancer Neg Hx    Social History   Occupational History   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    Employer: [employer]  Tobacco Use   Smoking status: Former    Current packs/day: 0.00    Types: Cigarettes    Quit date: 03/23/1976    Years since quitting: 47.9   Smokeless tobacco: Never  Vaping Use   Vaping status: Never Used  Substance and Sexual Activity   Alcohol  use: No   Drug use: No   Sexual activity: Not on file   Tobacco Counseling Counseling given: Not Answered  SDOH Screenings   Food Insecurity: No Food Insecurity (02/05/2024)  Housing: Unknown (02/05/2024)  Transportation Needs: No Transportation Needs (02/05/2024)  Utilities: Not At Risk (02/05/2024)  Alcohol  Screen: Low Risk (02/05/2024)  Depression (PHQ2-9): Medium Risk (02/05/2024)  Financial Resource Strain: Low Risk (02/05/2024)  Physical Activity: Insufficiently Active (02/05/2024)  Social Connections: Moderately Integrated (02/05/2024)  Stress: No Stress Concern Present (02/05/2024)  Tobacco Use: Medium Risk (02/05/2024)  Health Literacy: Adequate Health Literacy (02/05/2024)   See flowsheets for full screening details  Depression Screen PHQ 2 & 9 Depression Scale- Over the past 2 weeks, how often have you been bothered by any of the following problems? Little interest or pleasure in doing things: 0 Feeling down, depressed, or hopeless (PHQ Adolescent also includes...irritable): 0 PHQ-2 Total Score: 0 Trouble falling or staying asleep, or sleeping too much: 3 Feeling tired or having little energy: 3 (has not been feeling good) Poor appetite or overeating (PHQ Adolescent also includes...weight loss): 0 Feeling bad about yourself - or that you are a failure or have let yourself or your family down: 0 Trouble concentrating on things,  such as reading the newspaper or watching television (PHQ Adolescent also includes...like school work): 0 Moving or speaking so slowly that other people could have noticed. Or the opposite - being so fidgety or restless that you have been moving around a lot more than usual: 0 Thoughts that you would be better off dead, or of hurting yourself in some way: 0 PHQ-9 Total Score: 6 If you checked off any problems, how difficult have these problems made it for you to do your work, take care of things at home, or get along with other people?: Somewhat difficult     Goals Addressed             This Visit's Progress    Patient Stated  02/05/2024, wants to lose 10 pounds             Objective:    Today's Vitals   02/05/24 1450  Weight: 138 lb (62.6 kg)  Height: 5' 4 (1.626 m)   Body mass index is 23.69 kg/m.  Hearing/Vision screen Hearing Screening - Comments:: Denies hearing issues Vision Screening - Comments:: Regular eye exams, [facility] Immunizations and Health Maintenance Health Maintenance  Topic Date Due   Mammogram  10/29/2024   Colonoscopy  11/30/2024   Medicare Annual Wellness (AWV)  02/04/2025   DTaP/Tdap/Td (5 - Td or Tdap) 03/27/2033   Pneumococcal Vaccine: 50+ Years  Completed   Influenza Vaccine  Completed   Bone Density Scan  Completed   Hepatitis C Screening  Completed   Zoster Vaccines- Shingrix  Completed   Meningococcal B Vaccine  Aged Out   COVID-19 Vaccine  Discontinued        Assessment/Plan:  This is a routine wellness examination for Bianey.  Patient Care Team: Berneta Elsie Sayre, MD as PCP - General (Family Medicine) Marcey Elspeth PARAS, MD as Consulting Physician (Ophthalmology) Claudene Arthea HERO, DO as Consulting Physician (Family Medicine) Mannam, Praveen, MD as Consulting Physician (Pulmonary Disease) Kandyce Sor, MD as Consulting Physician (Obstetrics and Gynecology) [place], PA-C as Physician Assistant (Physician  Assistant)  I have personally reviewed and noted the following in the patients chart:   Medical and social history Use of alcohol , tobacco or illicit drugs  Current medications and supplements including opioid prescriptions. Functional ability and status Nutritional status Physical activity Advanced directives List of other physicians Hospitalizations, surgeries, and ER visits in previous 12 months Vitals Screenings to include cognitive, depression, and falls Referrals and appointments  No orders of the defined types were placed in this encounter.  In addition, I have reviewed and discussed with patient certain preventive protocols, quality metrics, and best practice recommendations. A written personalized care plan for preventive services as well as general preventive health recommendations were provided to patient.   Ardella FORBES Dawn, LPN   8/87/7973   Return in 1 year (on 02/04/2025).  After Visit Summary: (MyChart) Due to this being a telephonic visit, the after visit summary with patients personalized plan was offered to patient via MyChart   Nurse Notes: No voiced or noted concerns at this time  "

## 2024-02-13 ENCOUNTER — Encounter: Payer: Self-pay | Admitting: Pulmonary Disease

## 2024-02-13 ENCOUNTER — Ambulatory Visit: Admitting: Pulmonary Disease

## 2024-02-13 VITALS — BP 143/87 | HR 86 | Temp 98.0°F | Ht 64.0 in | Wt 141.0 lb

## 2024-02-13 DIAGNOSIS — J4520 Mild intermittent asthma, uncomplicated: Secondary | ICD-10-CM

## 2024-02-13 DIAGNOSIS — Z87891 Personal history of nicotine dependence: Secondary | ICD-10-CM

## 2024-02-13 DIAGNOSIS — K219 Gastro-esophageal reflux disease without esophagitis: Secondary | ICD-10-CM

## 2024-02-13 DIAGNOSIS — J45909 Unspecified asthma, uncomplicated: Secondary | ICD-10-CM

## 2024-02-13 MED ORDER — BREZTRI AEROSPHERE 160-9-4.8 MCG/ACT IN AERO: 2.0000 | INHALATION_SPRAY | Freq: Two times a day (BID) | RESPIRATORY_TRACT | 11 refills | Status: AC

## 2024-02-13 NOTE — Patient Instructions (Signed)
 Continue Breztri  for asthma We have sent in a new prescription and will check with pharmacy about continuing patient assistance Follow-up in 1 year.

## 2024-02-13 NOTE — Progress Notes (Signed)
 "              Megan Valentine    [id number]    10-22-1950  Primary Care Physician:Kremer, Elsie Sayre, MD  Referring Physician: Berneta Elsie Sayre, MD [address] [place],  [place] [address]  Chief complaint: Follow-up for asthma  HPI: 74 y.o. who  has a past medical history of Allergy, Arthritis, Asthma, Cataract, Chronic bronchitis (HCC), Chronic kidney disease, Complication of anesthesia, Diverticulosis, Environmental allergies, GERD (gastroesophageal reflux disease), Hiatal hernia, History of chicken pox, Hyperlipidemia, Hypothyroidism, IBS (irritable bowel syndrome), Migraines, Mitral valve prolapse, Neuromuscular disorder (HCC), Pneumonia, and Rectal prolapse.   Referred here for evaluation of shortness of breath, coughing for the past 4 months.  Symptoms are associated with occasional wheezing, chest congestion She has history of seasonal allergies, chronic kidney disease  Previously been on Flovent  but it is not covered by her insurance.  Advair was prescribed by her PCP but was too expensive for her.  Interim history: Discussed the use of AI scribe software for clinical note transcription with the patient, who gave verbal consent to proceed. Doing well with breathing.  Using Breztri  only 1 time per day.  Does not   Relevant pulmonary history Pets: Has a dog Occupation: Works as a software engineer Exposures: No exposures.  Denies any exposure to mold, hot tub, Jacuzzi or feather pillows or comforters Smoking history: 13-pack-year smoker.  Quit in 1979 Travel history: No significant travel history Relevant family history: Dad had asthma   Outpatient Encounter Medications as of 02/13/2024  Medication Sig   acetaminophen  (TYLENOL ) 325 MG tablet Take 2 tablets (650 mg total) by mouth every 6 (six) hours as needed for moderate pain.   albuterol  (VENTOLIN  HFA) 108 (90 Base) MCG/ACT inhaler INHALE 1 TO 2 PUFFS BY MOUTH EVERY 6 HOURS AS NEEDED FOR WHEEZING OR  SHORTNESS  OF   BREATH.   aspirin  81 MG chewable tablet Chew 1 tablet (81 mg total) by mouth 2 (two) times daily.   Budeson-Glycopyrrol-Formoterol  (BREZTRI  AEROSPHERE) 160-9-4.8 MCG/ACT AERO Inhale 2 puffs into the lungs in the morning and at bedtime.   Cranberry 12600 MG CAPS Take 1 tablet by mouth. daily   diclofenac  Sodium (VOLTAREN  ARTHRITIS PAIN) 1 % GEL Apply a small grape sized dollop 1 disorder 2 weeks of pends up to 4 times daily as needed   docusate sodium  (COLACE) 250 MG capsule Take 250 mg by mouth daily. Takes 4 daily   esomeprazole  (NEXIUM ) 40 MG capsule TAKE 1 CAPSULE BY MOUTH TWICE DAILY 30  MINUTES  BEFORE  BREAKFAST  AND  DINNER   estradiol  (ESTRACE ) 0.5 MG tablet Take 0.5 mg by mouth daily.   ezetimibe  (ZETIA ) 10 MG tablet Take 1 tablet by mouth once daily   famotidine (PEPCID) 20 MG tablet Take 20 mg by mouth daily.   hydroxypropyl methylcellulose / hypromellose (ISOPTO TEARS / GONIOVISC) 2.5 % ophthalmic solution Place 1 drop into both eyes 4 (four) times daily as needed for dry eyes.   ipratropium-albuterol  (DUONEB) 0.5-2.5 (3) MG/3ML SOLN Take 3 mLs by nebulization every 6 (six) hours as needed.   levocetirizine (XYZAL ) 5 MG tablet Take 1 tablet (5 mg total) by mouth every evening.   levothyroxine  (SYNTHROID ) 112 MCG tablet TAKE 1 TABLET BY MOUTH ONCE DAILY BEFORE BREAKFAST   lubiprostone  (AMITIZA ) 24 MCG capsule Take 1 capsule (24 mcg total) by mouth 2 (two) times daily with a meal.   magnesium  oxide (MAG-OX) 400 (240 Mg) MG tablet Take  400 mg by mouth daily.   metFORMIN  (GLUCOPHAGE -XR) 500 MG 24 hr tablet Take 1 tablet (500 mg total) by mouth 2 (two) times daily with a meal.   Multiple Vitamins-Minerals (HAIR SKIN AND NAILS FORMULA PO) Take by mouth.   potassium chloride  SA (KLOR-CON  M) 20 MEQ tablet Take 1 daily.   Sennosides 17.2 MG TABS Take 1 tablet by mouth. 4 capsules daily   predniSONE  (STERAPRED UNI-PAK 21 TAB) 10 MG (21) TBPK tablet Take following package directions (Patient not  taking: Reported on 02/13/2024)   No facility-administered encounter medications on file as of 02/13/2024.   Physical Exam: Blood pressure 124/82, pulse 88, height 5' 4 (1.626 m), weight 177 lb 6.4 oz (80.5 kg), SpO2 98%. Awake, oriented Lungs are clear to auscultation Heart regular rate and rhythm.  No murmurs rubs gallops  Data Reviewed: Imaging: Chest x-ray 03/11/2022-no acute cardiopulmonary abnormality I have reviewed the images personally.  PFTs: 07/21/2022 FVC 2.38 [81%], FEV1 2.17 [98%], F/F91, TLC 4.99 [98%], DLCO 17.03 [88%] Normal test  FENO 05/06/2022- 34  ACT score 02/13/2024-22  Labs: CBC 05/06/2022-WBC 8.3, eos 3.2%, absolute eosinophil count 262 IgE 05/06/2022-43  Assessment:  Asthma Doing well with Breztri .  Needs to use it only once a day. -Continue Breztri  inhaler.  Will check with pharmacy about continuing patient assistance  Gastroesophageal Reflux Disease (GERD) Patient reports nocturnal symptoms despite Nexium  BID and Pepcid. History of large hiatal hernia. -Continue current antacid regimen. -Consider follow-up with GI specialist if symptoms persist.  General Health Maintenance / Followup Plans -Schedule follow-up appointment in 1 year, or sooner if any changes in health status.   Plan/Recommendations: Breztri  with spacer   Jolynda Townley MD [facility] 02/13/2024, 1:57 PM  CC: Berneta Elsie Sayre, MD    "

## 2024-02-21 ENCOUNTER — Other Ambulatory Visit (HOSPITAL_COMMUNITY): Payer: Self-pay

## 2024-02-21 ENCOUNTER — Telehealth: Payer: Self-pay

## 2024-02-21 NOTE — Telephone Encounter (Signed)
 Left message for patient to call back about enrollment/ re-enrollment in patient assistance for Breztri  through [place].

## 2024-02-26 ENCOUNTER — Other Ambulatory Visit: Payer: Self-pay | Admitting: Family Medicine

## 2024-02-26 DIAGNOSIS — R7303 Prediabetes: Secondary | ICD-10-CM

## 2024-03-01 ENCOUNTER — Encounter: Payer: Self-pay | Admitting: Family Medicine

## 2024-03-01 ENCOUNTER — Ambulatory Visit: Admitting: Family Medicine

## 2024-03-01 VITALS — BP 110/68 | HR 82 | Temp 97.9°F | Ht 64.0 in | Wt 139.6 lb

## 2024-03-01 DIAGNOSIS — Z789 Other specified health status: Secondary | ICD-10-CM

## 2024-03-01 DIAGNOSIS — E782 Mixed hyperlipidemia: Secondary | ICD-10-CM

## 2024-03-01 DIAGNOSIS — E876 Hypokalemia: Secondary | ICD-10-CM

## 2024-03-01 DIAGNOSIS — R7303 Prediabetes: Secondary | ICD-10-CM

## 2024-03-01 DIAGNOSIS — E039 Hypothyroidism, unspecified: Secondary | ICD-10-CM

## 2024-03-01 DIAGNOSIS — N1831 Chronic kidney disease, stage 3a: Secondary | ICD-10-CM

## 2024-03-01 LAB — CBC
HCT: 42.9 % (ref 36.0–46.0)
Hemoglobin: 14.3 g/dL (ref 12.0–15.0)
MCHC: 33.4 g/dL (ref 30.0–36.0)
MCV: 93.2 fl (ref 78.0–100.0)
Platelets: 359 10*3/uL (ref 150.0–400.0)
RBC: 4.6 Mil/uL (ref 3.87–5.11)
RDW: 13.6 % (ref 11.5–15.5)
WBC: 6.6 10*3/uL (ref 4.0–10.5)

## 2024-03-01 LAB — COMPREHENSIVE METABOLIC PANEL WITH GFR
ALT: 13 U/L (ref 3–35)
AST: 14 U/L (ref 5–37)
Albumin: 4.5 g/dL (ref 3.5–5.2)
Alkaline Phosphatase: 88 U/L (ref 39–117)
BUN: 22 mg/dL (ref 6–23)
CO2: 29 meq/L (ref 19–32)
Calcium: 9.4 mg/dL (ref 8.4–10.5)
Chloride: 102 meq/L (ref 96–112)
Creatinine, Ser: 0.87 mg/dL (ref 0.40–1.20)
GFR: 65.85 mL/min
Glucose, Bld: 99 mg/dL (ref 70–99)
Potassium: 4 meq/L (ref 3.5–5.1)
Sodium: 139 meq/L (ref 135–145)
Total Bilirubin: 0.5 mg/dL (ref 0.2–1.2)
Total Protein: 7.3 g/dL (ref 6.0–8.3)

## 2024-03-01 LAB — URINALYSIS, ROUTINE W REFLEX MICROSCOPIC
Bilirubin Urine: NEGATIVE
Hgb urine dipstick: NEGATIVE
Ketones, ur: NEGATIVE
Leukocytes,Ua: NEGATIVE
Nitrite: NEGATIVE
Specific Gravity, Urine: 1.025 (ref 1.000–1.030)
Total Protein, Urine: NEGATIVE
Urine Glucose: NEGATIVE
Urobilinogen, UA: 0.2 (ref 0.0–1.0)
pH: 6 (ref 5.0–8.0)

## 2024-03-01 LAB — LIPID PANEL
Cholesterol: 193 mg/dL (ref 28–200)
HDL: 71.4 mg/dL
LDL Cholesterol: 96 mg/dL (ref 10–99)
NonHDL: 121.1
Total CHOL/HDL Ratio: 3
Triglycerides: 127 mg/dL (ref 10.0–149.0)
VLDL: 25.4 mg/dL (ref 0.0–40.0)

## 2024-03-01 LAB — HEMOGLOBIN A1C: Hgb A1c MFr Bld: 6 % (ref 4.6–6.5)

## 2024-03-01 LAB — TSH: TSH: 1.06 u[IU]/mL (ref 0.35–5.50)

## 2024-03-01 NOTE — Progress Notes (Signed)
 "  Established Patient Office Visit   Subjective:  Patient ID: Megan Valentine, female    DOB: 1950-06-10  Age: 74 y.o. MRN: [id number]  Chief Complaint  Patient presents with   Medical Management of Chronic Issues    Six month follow-up     HPI Encounter Diagnoses  Name Primary?   Prediabetes Yes   Acquired hypothyroidism    Hypokalemia    Stage 3a chronic kidney disease (HCC)    Mixed hyperlipidemia    Statin intolerance    For follow-up of above.  Continue Zetia  for elevated LDL.  Statin intolerance.  Continues 20 mEq of potassium chloride  for hypokalemia.  Continues metformin  500 twice daily for prediabetes.  Continues levothyroxine  112 mcg for hypothyroidism.   Review of Systems  Constitutional: Negative.   HENT: Negative.    Eyes:  Negative for blurred vision, discharge and redness.  Respiratory: Negative.    Cardiovascular: Negative.   Gastrointestinal:  Negative for abdominal pain.  Genitourinary: Negative.   Musculoskeletal: Negative.  Negative for myalgias.  Skin:  Negative for rash.  Neurological:  Negative for tingling, loss of consciousness and weakness.  Endo/Heme/Allergies:  Negative for polydipsia.      03/01/2024    8:26 AM 02/05/2024    2:52 PM 09/12/2023   11:06 AM  Depression screen PHQ 2/9  Decreased Interest 0 0 0  Down, Depressed, Hopeless 0 0 0  PHQ - 2 Score 0 0 0  Altered sleeping 3 3   Tired, decreased energy 0 3   Change in appetite 0 0   Feeling bad or failure about yourself  0 0   Trouble concentrating 0 0   Moving slowly or fidgety/restless 0 0   Suicidal thoughts 0 0   PHQ-9 Score 3 6   Difficult doing work/chores Not difficult at all Somewhat difficult       Current Medications[1]   Objective:     BP 110/68   Pulse 82   Temp 97.9 F (36.6 C)   Ht 5' 4 (1.626 m)   Wt 139 lb 9.6 oz (63.3 kg)   SpO2 99%   BMI 23.96 kg/m  Wt Readings from Last 3 Encounters:  03/01/24 139 lb 9.6 oz (63.3 kg)  02/13/24 141 lb (64 kg)   02/05/24 138 lb (62.6 kg)      Physical Exam Constitutional:      General: She is not in acute distress.    Appearance: Normal appearance. She is not ill-appearing, toxic-appearing or diaphoretic.  HENT:     Head: Normocephalic and atraumatic.     Right Ear: External ear normal.     Left Ear: External ear normal.  Eyes:     General: No scleral icterus.       Right eye: No discharge.        Left eye: No discharge.     Extraocular Movements: Extraocular movements intact.     Conjunctiva/sclera: Conjunctivae normal.  Cardiovascular:     Rate and Rhythm: Normal rate and regular rhythm.  Pulmonary:     Effort: Pulmonary effort is normal. No respiratory distress.     Breath sounds: Normal breath sounds. No wheezing or rales.  Musculoskeletal:     Cervical back: No rigidity or tenderness.     Right lower leg: Swelling present. No edema.     Left lower leg: Swelling present. No edema.  Skin:    General: Skin is warm and dry.  Neurological:     Mental Status:  She is alert and oriented to person, place, and time.  Psychiatric:        Mood and Affect: Mood normal.        Behavior: Behavior normal.      No results found for any visits on 03/01/24.    The 10-year ASCVD risk score (Arnett DK, et al., 2019) is: 9.8%    Assessment & Plan:   Prediabetes -     Comprehensive metabolic panel with GFR -     Urinalysis, Routine w reflex microscopic -     Hemoglobin A1c  Acquired hypothyroidism -     CBC -     TSH  Hypokalemia -     Comprehensive metabolic panel with GFR  Stage 3a chronic kidney disease (HCC) -     Comprehensive metabolic panel with GFR  Mixed hyperlipidemia -     Comprehensive metabolic panel with GFR -     Lipid panel  Statin intolerance    Return in about 6 months (around 08/29/2024).  Continue current medications.  Adjustments made pending results of labs.  Will try magnesium  citrate for sleep.  Megan Sim Lent, MD    [1]  Current  Outpatient Medications:    acetaminophen  (TYLENOL ) 325 MG tablet, Take 2 tablets (650 mg total) by mouth every 6 (six) hours as needed for moderate pain., Disp: 30 tablet, Rfl: 0   albuterol  (VENTOLIN  HFA) 108 (90 Base) MCG/ACT inhaler, INHALE 1 TO 2 PUFFS BY MOUTH EVERY 6 HOURS AS NEEDED FOR WHEEZING OR  SHORTNESS  OF  BREATH., Disp: 9 g, Rfl: 5   aspirin  81 MG chewable tablet, Chew 1 tablet (81 mg total) by mouth 2 (two) times daily., Disp: , Rfl:    budesonide -glycopyrrolate-formoterol  (BREZTRI  AEROSPHERE) 160-9-4.8 MCG/ACT AERO inhaler, Inhale 2 puffs into the lungs in the morning and at bedtime., Disp: 10.7 g, Rfl: 11   Cranberry 12600 MG CAPS, Take 1 tablet by mouth. daily, Disp: , Rfl:    diclofenac  Sodium (VOLTAREN  ARTHRITIS PAIN) 1 % GEL, Apply a small grape sized dollop 1 disorder 2 weeks of pends up to 4 times daily as needed, Disp: 150 g, Rfl: 1   docusate sodium  (COLACE) 250 MG capsule, Take 250 mg by mouth daily. Takes 4 daily, Disp: , Rfl:    esomeprazole  (NEXIUM ) 40 MG capsule, TAKE 1 CAPSULE BY MOUTH TWICE DAILY 30  MINUTES  BEFORE  BREAKFAST  AND  DINNER, Disp: 60 capsule, Rfl: 5   estradiol  (ESTRACE ) 0.5 MG tablet, Take 0.5 mg by mouth daily., Disp: , Rfl:    ezetimibe  (ZETIA ) 10 MG tablet, Take 1 tablet by mouth once daily, Disp: 90 tablet, Rfl: 3   famotidine (PEPCID) 20 MG tablet, Take 20 mg by mouth daily., Disp: , Rfl:    hydroxypropyl methylcellulose / hypromellose (ISOPTO TEARS / GONIOVISC) 2.5 % ophthalmic solution, Place 1 drop into both eyes 4 (four) times daily as needed for dry eyes., Disp: , Rfl:    ipratropium-albuterol  (DUONEB) 0.5-2.5 (3) MG/3ML SOLN, Take 3 mLs by nebulization every 6 (six) hours as needed., Disp: 360 mL, Rfl: 11   levocetirizine (XYZAL ) 5 MG tablet, Take 1 tablet (5 mg total) by mouth every evening., Disp: 30 tablet, Rfl: 3   levothyroxine  (SYNTHROID ) 112 MCG tablet, TAKE 1 TABLET BY MOUTH ONCE DAILY BEFORE BREAKFAST, Disp: 100 tablet, Rfl: 0    lubiprostone  (AMITIZA ) 24 MCG capsule, Take 1 capsule (24 mcg total) by mouth 2 (two) times daily with a meal., Disp:  180 capsule, Rfl: 3   magnesium  oxide (MAG-OX) 400 (240 Mg) MG tablet, Take 400 mg by mouth daily., Disp: , Rfl:    metFORMIN  (GLUCOPHAGE -XR) 500 MG 24 hr tablet, TAKE 1 TABLET BY MOUTH TWICE DAILY WITH A MEAL, Disp: 180 tablet, Rfl: 0   Multiple Vitamins-Minerals (HAIR SKIN AND NAILS FORMULA PO), Take by mouth., Disp: , Rfl:    potassium chloride  SA (KLOR-CON  M) 20 MEQ tablet, Take 1 daily., Disp: 90 tablet, Rfl: 3   Sennosides 17.2 MG TABS, Take 1 tablet by mouth. 4 capsules daily, Disp: , Rfl:    predniSONE  (STERAPRED UNI-PAK 21 TAB) 10 MG (21) TBPK tablet, Take following package directions (Patient not taking: Reported on 03/01/2024), Disp: 21 tablet, Rfl: 0  "

## 2024-03-01 NOTE — Telephone Encounter (Signed)
No response from patient, closing encounter.

## 2024-09-03 ENCOUNTER — Ambulatory Visit: Admitting: Family Medicine

## 2025-02-11 ENCOUNTER — Ambulatory Visit
